# Patient Record
Sex: Female | Born: 1945 | Race: Black or African American | Hispanic: No | Marital: Single | Smoking: Former smoker
Health system: Southern US, Community
[De-identification: ages and names within clinical notes are randomized; demographics above are authoritative.]

## PROBLEM LIST (undated history)

## (undated) DIAGNOSIS — Z86718 Personal history of other venous thrombosis and embolism: Secondary | ICD-10-CM

## (undated) DIAGNOSIS — I716 Thoracoabdominal aortic aneurysm, without rupture, unspecified: Secondary | ICD-10-CM

## (undated) DIAGNOSIS — I10 Essential (primary) hypertension: Secondary | ICD-10-CM

## (undated) DIAGNOSIS — J449 Chronic obstructive pulmonary disease, unspecified: Secondary | ICD-10-CM

## (undated) HISTORY — DX: Chronic obstructive pulmonary disease, unspecified: J44.9

## (undated) HISTORY — DX: Thoracoabdominal aortic aneurysm, without rupture: I71.6

## (undated) HISTORY — DX: Thoracoabdominal aortic aneurysm, without rupture, unspecified: I71.60

## (undated) HISTORY — DX: Personal history of other venous thrombosis and embolism: Z86.718

## (undated) HISTORY — PX: NO PAST SURGERIES: SHX2092

---

## 2008-07-05 ENCOUNTER — Inpatient Hospital Stay (HOSPITAL_COMMUNITY): Admission: EM | Admit: 2008-07-05 | Discharge: 2008-07-07 | Payer: Self-pay | Admitting: Emergency Medicine

## 2008-07-05 ENCOUNTER — Encounter (INDEPENDENT_AMBULATORY_CARE_PROVIDER_SITE_OTHER): Payer: Self-pay | Admitting: Emergency Medicine

## 2008-07-06 ENCOUNTER — Ambulatory Visit: Payer: Self-pay | Admitting: Vascular Surgery

## 2010-08-19 LAB — BASIC METABOLIC PANEL
CO2: 28 mEq/L (ref 19–32)
Chloride: 103 mEq/L (ref 96–112)
GFR calc Af Amer: >60 mL/min (ref >60)
Glucose, Bld: 100 mg/dL — ABNORMAL HIGH (ref 70–99)
Sodium: 136 mEq/L (ref 135–145)

## 2010-08-19 LAB — CBC
HCT: 35.6 % — ABNORMAL LOW (ref 36.0–46.0)
Hemoglobin: 12 g/dL (ref 12.0–15.0)
MCHC: 33.8 g/dL (ref 30.0–36.0)
MCV: 88.8 fL (ref 78.0–100.0)
RBC: 4.01 MIL/uL (ref 3.87–5.11)
RDW: 14.6 % (ref 11.5–15.5)

## 2010-08-24 LAB — CBC
HCT: 37.5 % (ref 36.0–46.0)
HCT: 38.4 % (ref 36.0–46.0)
Hemoglobin: 12.6 g/dL (ref 12.0–15.0)
MCHC: 32.9 g/dL (ref 30.0–36.0)
MCV: 88.3 fL (ref 78.0–100.0)
MCV: 88.6 fL (ref 78.0–100.0)
RBC: 4.25 MIL/uL (ref 3.87–5.11)
RDW: 14.4 % (ref 11.5–15.5)
WBC: 4.9 10*3/uL (ref 4.0–10.5)

## 2010-08-24 LAB — POCT I-STAT, CHEM 8
BUN: 13 mg/dL (ref 6–23)
Calcium, Ion: 1.08 mmol/L — ABNORMAL LOW (ref 1.12–1.32)
Chloride: 106 mEq/L (ref 96–112)
Creatinine, Ser: 0.8 mg/dL (ref 0.4–1.2)

## 2010-08-24 LAB — DIFFERENTIAL
Basophils Absolute: 0.1 10*3/uL (ref 0.0–0.1)
Lymphs Abs: 2.1 10*3/uL (ref 0.7–4.0)
Monocytes Absolute: 0.6 10*3/uL (ref 0.1–1.0)
Monocytes Relative: 11 % (ref 3–12)
Neutro Abs: 2.4 10*3/uL (ref 1.7–7.7)

## 2010-08-24 LAB — BASIC METABOLIC PANEL
BUN: 6 mg/dL (ref 6–23)
Chloride: 108 mEq/L (ref 96–112)
GFR calc Af Amer: >60 mL/min (ref >60)
Potassium: 3.7 mEq/L (ref 3.5–5.1)

## 2010-08-24 LAB — MAGNESIUM: Magnesium: 2 mg/dL (ref 1.5–2.5)

## 2010-08-24 LAB — APTT: aPTT: 31 seconds (ref 24–37)

## 2010-09-21 NOTE — H&P (Signed)
[name], [name]                 ACCOUNT NO.:  [id number]   MEDICAL RECORD NO.:  [id number]          PATIENT TYPE:  INP   LOCATION:  1411                         FACILITY:  [facility]   PHYSICIAN:  Della Goo, M.D. DATE OF BIRTH:  01/06/1946   DATE OF ADMISSION:  07/05/2008  DATE OF DISCHARGE:                              HISTORY & PHYSICAL   ADMISSION DATE:  July 05, 2008.   CHIEF COMPLAINT:  Right leg swelling and pain.   HISTORY OF PRESENT ILLNESS:  This is a 65 year old female who presents  to the emergency department with complaints of increased right leg  swelling and pain over the past 2 weeks.  She reports feeling a hard  place in the back of her leg.  She denies feeling increased warmth from  the area.  She denies having any history of trauma.  She also denies  having any history of a previous similar problem.  Patient denies having  any clotting disorders in her family.   PAST MEDICAL HISTORY:  No formal diagnoses of medical problems.   PAST SURGICAL HISTORY:  None.   MEDICATIONS:  None.   ALLERGIES:  NO KNOWN DRUG ALLERGIES.   SOCIAL HISTORY:  Patient is a nonsmoker, nondrinker.   FAMILY HISTORY:  Negative for coronary artery disease, diabetic disease,  hypertension, or cancers.   REVIEW OF SYSTEMS:  Pertinents are mentioned above.  Patient denies  having any chest pain, shortness of breath, fevers, chills, nausea,  vomiting, diarrhea.  No changes in bowel or bladder habits.  No weight  loss or weight gain.  No changes in appetite, no lightheadedness,  syncope, seizure activity.  No changes in sleep cycle.   PHYSICAL EXAMINATION FINDINGS:  This is a 65 year old obese female in  discomfort but no acute distress.  VITAL SIGNS:  Temperature 97.3.  Blood pressure 189/74.  Heart rate 70.  Respirations 18.  O2 saturations 98%.  HEENT:  Normocephalic, atraumatic.  There is no scleral icterus.  Pupils  are equally round and reactive to light.  Extraocular  movements are  intact.  Funduscopic benign.  Nares are patent bilaterally.  Oropharynx  is clear.  NECK:  Supple.  Full range of motion.  No thyromegaly, adenopathy, or  jugular venous distention.  CARDIOVASCULAR:  Mild tachycardia.  No murmurs, gallops, or rubs.  LUNGS:  Clear to auscultation bilaterally.  ABDOMEN:  Positive bowel sounds.  Soft, nontender, nondistended.  EXTREMITIES:  Without cyanosis, clubbing, or edema except of the right  lower extremity.  There is 2 to 3+ edema of the lower 2/3 of her lower  extremity.  The edema is tense and painful to palpation.  No palpable  cords at present.  NEUROLOGIC:  Alert and oriented x3.  There are no focal deficits.   LABORATORY STUDIES:  White blood cell count 5.4, hemoglobin 12.6,  hematocrit 38.4, platelets 249, MCV 88.6, neutrophils 46%, lymphocytes  38%.  Sodium 140, potassium 3.1, chloride 106, bicarb 25, BUN 13,  creatinine 0.8, and glucose 98.  Pro time 13.0, INR 1.0, PTT 31.  Venous  duplex Doppler study of  the right lower extremity reveals extensive  occlusive deep venous thrombosis throughout the right lower extremity.  No superficial venous thrombosis or Baker cyst seen.  No propagation of  DVT to the left lower extremity.   ASSESSMENT:  A 65 year old female being admitted with:  1. 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  2. Hypertension.  3. Hypokalemia.  4. Morbid obesity.   PLAN:  Patient will be admitted to a telemetry area and full-dose  Lovenox therapy has been initiated.  Patient will be placed on the  Coumadin protocol and Coumadin teaching will also be started.  Her  potassium will be repleted and patient will be placed on GI prophylaxis.  A p.r.n. clonidine order has been given for elevated blood pressures at  this time and this will be monitored and patient will be started on  appropriate blood pressure medications if needed.      Della Goo, M.D.  Electronically Signed      HJ/MEDQ  D:  07/06/2008  T:  07/06/2008  Job:  [id number]

## 2010-09-21 NOTE — Discharge Summary (Signed)
[name], [name]                 ACCOUNT NO.:  [id number]   MEDICAL RECORD NO.:  [id number]          PATIENT TYPE:  INP   LOCATION:  1411                         FACILITY:  [facility]   PHYSICIAN:  Hillery Aldo, M.D.   DATE OF BIRTH:  Dec 03, 1945   DATE OF ADMISSION:  07/05/2008  DATE OF DISCHARGE:                               DISCHARGE SUMMARY   PRIMARY CARE PHYSICIAN:  None.   The patient is being referred to Dr. Mikeal Hawthorne for hospital follow-up and  primary care.   DISCHARGE DIAGNOSES:  1. Right lower extremity deep venous thrombosis.  2. Hypertension.  3. Hypokalemia.   DISCHARGE MEDICATIONS:  1. Lisinopril 40 mg p.o. daily.  2. Coumadin 10 mg p.o. daily or as directed by PCP.  3. Lovenox 110 mg subcutaneously q.12 h. until told to stop by PCP.   CONSULTATIONS:  None.   BRIEF ADMISSION HPI:  The patient is a 65 year old female with no  significant past medical history who presented to the hospital with a  chief complaint of right lower extremity swelling and pain.  She had not  been on any medications prior to admission.  She was admitted for  further evaluation and workup.  For full details, please see the  dictated report done by Dr. Lovell Sheehan.   PROCEDURES AND DIAGNOSTIC STUDIES:  Right lower extremity venous  Dopplers done on July 05, 2008 showed extensive occlusive DVT  throughout the right lower extremity.  No superficial venous thrombosis  or Baker cyst.  No propagation of the DVT to the left lower extremity.   DISCHARGE LABORATORY VALUES:  Sodium was 136, potassium 3.2 (repleted  with 40 mEq of KCl prior to discharge), chloride 103, bicarb 28, BUN 10,  creatinine 0.75, glucose 100.  White blood cell count was 5, hemoglobin  12, hematocrit 35.6, platelets 246.  PT/INR was 14.4/1.1.  TSH was  1.305.   HOSPITAL COURSE BY PROBLEM:  1. Right lower extremity deep vein thrombosis:  The patient was      admitted and put on therapeutic doses of Lovenox and Coumadin.   The      patient was anxious to be discharged and agreed to give herself      Lovenox injections.  She will be taught how to self-inject Lovenox      prior to discharge.  A hospital follow-up appointment with a lab      check has been made for the patient with Dr. Mikeal Hawthorne at the [facility] for a 2 p.m. on July 08, 2008.  The patient has been      informed fully that she will need close monitoring while on      Coumadin and daily blood work done until her INR is therapeutic and      then routinely thereafter.  The patient agrees to this and      therefore will be discharged home.  2. Hypertension:  The patient is a newly diagnosed hypertensive.  She      was placed on lisinopril at 20 mg and this has been  titrated up to      40 at discharge.  She will need close follow-up with regard to her      blood pressure.  3. Hypokalemia:  The patient was appropriately repleted prior to      discharge.   DISPOSITION:  The patient is anxious for discharge and agrees to self-  inject Lovenox and follow up with Dr. Mikeal Hawthorne.   Time spent coordinating care for discharge and the discharge  instructions equals 40 minutes.      Hillery Aldo, M.D.  Electronically Signed     CR/MEDQ  D:  07/07/2008  T:  07/07/2008  Job:  [id number]   cc:   Lonia Blood, M.D.

## 2010-11-15 ENCOUNTER — Inpatient Hospital Stay (HOSPITAL_COMMUNITY)
Admission: EM | Admit: 2010-11-15 | Discharge: 2010-11-22 | DRG: 394 | Disposition: A | Discharge status: Home / Self Care | Payer: Medicare Other | Attending: Internal Medicine | Admitting: Internal Medicine

## 2010-11-15 ENCOUNTER — Emergency Department (HOSPITAL_COMMUNITY): Payer: Medicare Other

## 2010-11-15 DIAGNOSIS — Z7901 Long term (current) use of anticoagulants: Secondary | ICD-10-CM

## 2010-11-15 DIAGNOSIS — N390 Urinary tract infection, site not specified: Secondary | ICD-10-CM | POA: Diagnosis present

## 2010-11-15 DIAGNOSIS — K55059 Acute (reversible) ischemia of intestine, part and extent unspecified: Principal | ICD-10-CM | POA: Diagnosis present

## 2010-11-15 DIAGNOSIS — E876 Hypokalemia: Secondary | ICD-10-CM | POA: Diagnosis present

## 2010-11-15 DIAGNOSIS — Z86718 Personal history of other venous thrombosis and embolism: Secondary | ICD-10-CM

## 2010-11-15 DIAGNOSIS — I10 Essential (primary) hypertension: Secondary | ICD-10-CM | POA: Diagnosis present

## 2010-11-15 LAB — URINALYSIS, ROUTINE W REFLEX MICROSCOPIC
Hgb urine dipstick: NEGATIVE
Nitrite: NEGATIVE
Specific Gravity, Urine: 1.024 (ref 1.005–1.030)
Urobilinogen, UA: 1 mg/dL (ref 0.0–1.0)

## 2010-11-15 LAB — URINE MICROSCOPIC-ADD ON

## 2010-11-15 LAB — LIPID PANEL
Cholesterol: 149 mg/dL (ref 0–200)
HDL: 49 mg/dL (ref >39)
Total CHOL/HDL Ratio: 3 RATIO
Triglycerides: 92 mg/dL (ref <150)

## 2010-11-15 LAB — HEPATIC FUNCTION PANEL
ALT: 10 U/L (ref 0–35)
AST: 13 U/L (ref 0–37)
Bilirubin, Direct: 0.1 mg/dL (ref 0.0–0.3)
Total Bilirubin: 0.1 mg/dL — ABNORMAL LOW (ref 0.3–1.2)

## 2010-11-15 LAB — OCCULT BLOOD, POC DEVICE: Fecal Occult Bld: POSITIVE

## 2010-11-15 LAB — DIFFERENTIAL
Lymphocytes Relative: 22 % (ref 12–46)
Lymphs Abs: 2.4 10*3/uL (ref 0.7–4.0)
Neutrophils Relative %: 71 % (ref 43–77)

## 2010-11-15 LAB — BASIC METABOLIC PANEL
BUN: 16 mg/dL (ref 6–23)
Chloride: 101 mEq/L (ref 96–112)
Creatinine, Ser: 0.81 mg/dL (ref 0.50–1.10)
GFR calc Af Amer: >60 mL/min (ref >60)
GFR calc non Af Amer: >60 mL/min (ref >60)

## 2010-11-15 LAB — CBC
HCT: 37.4 % (ref 36.0–46.0)
MCV: 86.4 fL (ref 78.0–100.0)
RBC: 4.33 MIL/uL (ref 3.87–5.11)
WBC: 10.7 10*3/uL — ABNORMAL HIGH (ref 4.0–10.5)

## 2010-11-15 LAB — MAGNESIUM: Magnesium: 2.2 mg/dL (ref 1.5–2.5)

## 2010-11-15 LAB — GLUCOSE, CAPILLARY: Glucose-Capillary: 86 mg/dL (ref 70–99)

## 2010-11-15 LAB — CROSSMATCH: Antibody Screen: NEGATIVE

## 2010-11-15 LAB — CK TOTAL AND CKMB (NOT AT ARMC): Relative Index: 3.1 — ABNORMAL HIGH (ref 0.0–2.5)

## 2010-11-16 DIAGNOSIS — R109 Unspecified abdominal pain: Secondary | ICD-10-CM

## 2010-11-16 DIAGNOSIS — R195 Other fecal abnormalities: Secondary | ICD-10-CM

## 2010-11-16 DIAGNOSIS — R1084 Generalized abdominal pain: Secondary | ICD-10-CM

## 2010-11-16 DIAGNOSIS — K55059 Acute (reversible) ischemia of intestine, part and extent unspecified: Secondary | ICD-10-CM

## 2010-11-16 DIAGNOSIS — R933 Abnormal findings on diagnostic imaging of other parts of digestive tract: Secondary | ICD-10-CM

## 2010-11-16 LAB — CBC
HCT: 33.6 % — ABNORMAL LOW (ref 36.0–46.0)
HCT: 33.3 % — ABNORMAL LOW (ref 36.0–46.0)
HCT: 34 % — ABNORMAL LOW (ref 36.0–46.0)
Hemoglobin: 11.3 g/dL — ABNORMAL LOW (ref 12.0–15.0)
Hemoglobin: 11 g/dL — ABNORMAL LOW (ref 12.0–15.0)
Hemoglobin: 11.5 g/dL — ABNORMAL LOW (ref 12.0–15.0)
MCH: 29.1 pg (ref 26.0–34.0)
MCH: 28.7 pg (ref 26.0–34.0)
MCH: 28.7 pg (ref 26.0–34.0)
MCHC: 33.6 g/dL (ref 30.0–36.0)
MCHC: 33 g/dL (ref 30.0–36.0)
MCHC: 32.9 g/dL (ref 30.0–36.0)
MCV: 86.6 fL (ref 78.0–100.0)
MCV: 86.9 fL (ref 78.0–100.0)
MCV: 87.2 fL (ref 78.0–100.0)
Platelets: 257 10*3/uL (ref 150–400)
Platelets: 257 10*3/uL (ref 150–400)
RBC: 3.9 MIL/uL (ref 3.87–5.11)
RDW: 13.3 % (ref 11.5–15.5)
RDW: 13.4 % (ref 11.5–15.5)
WBC: 8.3 10*3/uL (ref 4.0–10.5)

## 2010-11-16 LAB — SEDIMENTATION RATE: Sed Rate: 60 mm/hr — ABNORMAL HIGH (ref 0–22)

## 2010-11-16 LAB — GLUCOSE, CAPILLARY
Glucose-Capillary: 81 mg/dL (ref 70–99)
Glucose-Capillary: 87 mg/dL (ref 70–99)
Glucose-Capillary: 77 mg/dL (ref 70–99)
Glucose-Capillary: 69 mg/dL — ABNORMAL LOW (ref 70–99)

## 2010-11-16 LAB — COMPREHENSIVE METABOLIC PANEL
ALT: 8 U/L (ref 0–35)
Albumin: 2.7 g/dL — ABNORMAL LOW (ref 3.5–5.2)
Alkaline Phosphatase: 63 U/L (ref 39–117)
Chloride: 102 mEq/L (ref 96–112)
Glucose, Bld: 74 mg/dL (ref 70–99)
Potassium: 3 mEq/L — ABNORMAL LOW (ref 3.5–5.1)
Sodium: 137 mEq/L (ref 135–145)
Total Bilirubin: 0.2 mg/dL — ABNORMAL LOW (ref 0.3–1.2)
Total Protein: 6.9 g/dL (ref 6.0–8.3)

## 2010-11-16 LAB — TSH: TSH: 1.784 u[IU]/mL (ref 0.350–4.500)

## 2010-11-16 LAB — MAGNESIUM: Magnesium: 2.2 mg/dL (ref 1.5–2.5)

## 2010-11-16 LAB — LIPID PANEL
Total CHOL/HDL Ratio: 3.1 RATIO
VLDL: 18 mg/dL (ref 0–40)

## 2010-11-16 LAB — CARDIAC PANEL(CRET KIN+CKTOT+MB+TROPI)
CK, MB: 4.8 ng/mL — ABNORMAL HIGH (ref 0.3–4.0)
CK, MB: 4.5 ng/mL — ABNORMAL HIGH (ref 0.3–4.0)
Troponin I: <0.3 ng/mL (ref <0.30)

## 2010-11-16 LAB — PROTIME-INR
INR: 1.27 (ref 0.00–1.49)
Prothrombin Time: 16.2 seconds — ABNORMAL HIGH (ref 11.6–15.2)

## 2010-11-16 LAB — ABO/RH: ABO/RH(D): B POS

## 2010-11-16 LAB — HEPARIN LEVEL (UNFRACTIONATED): Heparin Unfractionated: 0.1 IU/mL — ABNORMAL LOW (ref 0.30–0.70)

## 2010-11-16 MED ORDER — IOHEXOL 300 MG/ML  SOLN
100.0000 mL | Freq: Once | INTRAMUSCULAR | Status: AC | PRN
  Administered 2010-11-16: 100 mL via INTRAVENOUS

## 2010-11-16 NOTE — H&P (Signed)
[name], [name]                 ACCOUNT NO.:  [id number]  MEDICAL RECORD NO.:  [id number]  LOCATION:  [facility]                         FACILITY:  [facility]  PHYSICIAN:  Eduard Clos, MDDATE OF BIRTH:  08-22-1945  DATE OF ADMISSION:  11/15/2010 DATE OF DISCHARGE:                             HISTORY & PHYSICAL   PRIMARY CARE PHYSICIAN:  Unassigned.  CHIEF COMPLAINT:  Abdominal pain.  HISTORY OF PRESENTING ILLNESS:  A 65 year old female with history of previous DVT, presents with complaints of having abdominal pain.  The patient saying that she has been having abdominal pain for the last few weeks, which has increased over the last couple of days.  The pain is most in the epigastric area and right upper quadrant area, which the patient is unable to correctly characterized.  The patient states that sometimes increased the pain, but the pain has become more softer and persistent for which she came to the ER.  In the ER, the patient also had a stool for occult blood which was positive.  At this time, given the history, the patient has been taking NSAIDs for her arthralgias. The patient probably has septal GI bleed with a possibility of peptic ulcer disease.  The patient denies any chest pain, denies any shortness of breath, dizziness, loss of consciousness or any focal deficit.  Denies any dysuria or discharge, but does have diarrhea which the patient states has been worsened for last 2 days, and has been chronic and nothing new for her.  Denies any using any recent antibiotics.  PAST MEDICAL HISTORY:  History of DVT and was diagnosed with hypertension.  PAST SURGICAL HISTORY:  Non-preparation.  ALLERGIES:  No known drug allergies.  FAMILY HISTORY:  Negative for any colon cancer, positive for hypertension.  SOCIAL HISTORY:  Socially, the patient denies smoking cigarette, drinking alcohol, or use illegal drugs.  REVIEW OF SYSTEMS:  As per the history of presenting illness  nothing else significant.  PHYSICAL EXAMINATION:  GENERAL:  The patient examined at bedside not in acute distress. VITAL SIGNS:  Blood pressure is 120/70, pulse is 68 per minute, temperature 98.6, respirations 18 per minute, and O2 sat 96%. HEENT:  Anicteric.  No pallor.  No discharge from ears, eyes, nose, or mouth. CHEST:  Bilateral air entry present.  No rhonchi.  No crepitation. HEART:  S1 and S2 heard. ABDOMEN:  Soft.  There is tenderness in the right upper quadrant and epigastric area, and no guarding or rigidity.  Bowel sounds are present. CNS:  The patient is alert, awake, and oriented to time, place, and person.  Moves upper and lower extremities 5/5. EXTREMITIES:  Peripheral pulses are felt.  No edema.  LABORATORY STUDIES:  EKG shows normal sinus rhythm with heart rate around 79 beats per minute with mild some T-wave change in the lateral leads.  I do not have an old EKG to compare.  CBC; WBC is 10.7, hemoglobin is 12.8, hematocrit is 37.4, platelets 278.  Complete metabolic panel; sodium 138, potassium 3.2, chloride 101, carbon dioxide 25, glucose 89, BUN 16, creatinine 0.8.  Total bilirubin is 0.1, alkaline phosphatase is 74, AST 13, ALT 10, total protein 8.2,  albumin 3.1, calcium 8.9, lipase 19.  CK 158, MB is 4.9, relative index 3.1, troponin less than 0.3.  UA is cloudy, ketones 15, negative glucose, protein 30, leukocytes moderate, squamous epithelial, wbc 11-20, bacteria few, mucous present.  Fecal occult blood is positive.  ASSESSMENT: 1. Abdominal pain with fecal occult blood positive and history of     using nonsteroidal anti-inflammatory drugs for chronic arthralgia. 2. Possible urinary tract infection. 3. History of deep venous thrombosis. 4. History of chronic diarrhea.  PLAN: 1. At this time, we will admit the patient to telemetry. 2. For abdominal pain and stool occult blood positive we will keep     on iv protonix and check CBC transfuse if needed. I  have consulted     GI Dr Thana Ates will see patient later. Patients abdomen is tender,    will get a CT abdomen and pelvis to look into other causes for the     abdominal pain.Will get lactic acid levels. 3. Possible UTI, the patient will be kept on ceftriaxone.  We will get     urine cultures and sensitivity. 4. The patient does have some EKG changes, though the patient does not     have any chest pain for now we will cycle cardiac enzymes. 5. Further recommendation based on the clinical course and test     orders.     Eduard Clos, MD     ANK/MEDQ  D:  11/15/2010  T:  11/15/2010  Job:  [id number]  Electronically Signed by Midge Minium MD on 11/16/2010 05:46:38 AM

## 2010-11-17 LAB — CBC
HCT: 33.6 % — ABNORMAL LOW (ref 36.0–46.0)
Hemoglobin: 11.2 g/dL — ABNORMAL LOW (ref 12.0–15.0)
MCH: 29 pg (ref 26.0–34.0)
MCHC: 33.3 g/dL (ref 30.0–36.0)
MCV: 87 fL (ref 78.0–100.0)
RDW: 13.2 % (ref 11.5–15.5)

## 2010-11-17 LAB — HOMOCYSTEINE: Homocysteine: 7 umol/L (ref 4.0–15.4)

## 2010-11-17 LAB — BASIC METABOLIC PANEL
CO2: 24 mEq/L (ref 19–32)
Calcium: 8.2 mg/dL — ABNORMAL LOW (ref 8.4–10.5)
Chloride: 106 mEq/L (ref 96–112)
Creatinine, Ser: 0.59 mg/dL (ref 0.50–1.10)
Glucose, Bld: 91 mg/dL (ref 70–99)

## 2010-11-17 LAB — GLUCOSE, CAPILLARY
Glucose-Capillary: 91 mg/dL (ref 70–99)
Glucose-Capillary: 109 mg/dL — ABNORMAL HIGH (ref 70–99)
Glucose-Capillary: 106 mg/dL — ABNORMAL HIGH (ref 70–99)

## 2010-11-17 LAB — ANA: Anti Nuclear Antibody(ANA): NEGATIVE

## 2010-11-18 LAB — LUPUS ANTICOAGULANT PANEL
DRVVT: 46.1 secs — ABNORMAL HIGH (ref 36.2–44.3)
PTT Lupus Anticoagulant: 131.8 secs — ABNORMAL HIGH (ref 30.0–45.6)
PTTLA 4:1 Mix: 97.9 secs — ABNORMAL HIGH (ref 30.0–45.6)

## 2010-11-18 LAB — GLUCOSE, CAPILLARY
Glucose-Capillary: 109 mg/dL — ABNORMAL HIGH (ref 70–99)
Glucose-Capillary: 142 mg/dL — ABNORMAL HIGH (ref 70–99)
Glucose-Capillary: 110 mg/dL — ABNORMAL HIGH (ref 70–99)

## 2010-11-18 LAB — CBC
Hemoglobin: 10.9 g/dL — ABNORMAL LOW (ref 12.0–15.0)
Platelets: 280 10*3/uL (ref 150–400)
RBC: 3.81 MIL/uL — ABNORMAL LOW (ref 3.87–5.11)
WBC: 6.1 10*3/uL (ref 4.0–10.5)

## 2010-11-18 LAB — HEPARIN LEVEL (UNFRACTIONATED): Heparin Unfractionated: 0.3 IU/mL (ref 0.30–0.70)

## 2010-11-18 LAB — ANTITHROMBIN III: AntiThromb III Func: 100 % (ref 76–126)

## 2010-11-18 LAB — PROTEIN S ACTIVITY: Protein S Activity: 71 % (ref 69–129)

## 2010-11-19 LAB — CBC
HCT: 32.7 % — ABNORMAL LOW (ref 36.0–46.0)
Hemoglobin: 10.7 g/dL — ABNORMAL LOW (ref 12.0–15.0)
RBC: 3.79 MIL/uL — ABNORMAL LOW (ref 3.87–5.11)
WBC: 6.1 10*3/uL (ref 4.0–10.5)

## 2010-11-19 LAB — PROTIME-INR
INR: 1.14 (ref 0.00–1.49)
Prothrombin Time: 14.8 seconds (ref 11.6–15.2)

## 2010-11-19 LAB — GLUCOSE, CAPILLARY: Glucose-Capillary: 110 mg/dL — ABNORMAL HIGH (ref 70–99)

## 2010-11-19 LAB — PROTEIN S, TOTAL: Protein S Ag, Total: 108 % (ref 60–150)

## 2010-11-20 LAB — CBC
MCV: 86.6 fL (ref 78.0–100.0)
Platelets: 299 10*3/uL (ref 150–400)
RDW: 13.6 % (ref 11.5–15.5)
WBC: 6.3 10*3/uL (ref 4.0–10.5)

## 2010-11-20 LAB — HEPARIN LEVEL (UNFRACTIONATED): Heparin Unfractionated: 0.61 IU/mL (ref 0.30–0.70)

## 2010-11-20 NOTE — Consult Note (Signed)
  NAMECAIA, LOFARO                 ACCOUNT NO.:  [id number]  MEDICAL RECORD NO.:  [id number]  LOCATION:  4735                         FACILITY:  [facility]  PHYSICIAN:  Gabrielle Dare. Janee Morn, M.D.DATE OF BIRTH:  09/29/1945  DATE OF CONSULTATION:  11/16/2010 DATE OF DISCHARGE:                                CONSULTATION   REASON FOR CONSULTATION:  Abdominal pain.  HISTORY OF PRESENT ILLNESS:  [name] is a 65 year old African American female who complains of a 2-day history of epigastric abdominal pain with occasional nausea and vomiting.  She also has stomach discomfort recently, but this was an increase in pain.  She had associated anorexia.  She has been having bowel movements, but sometimes loose and one was a little bit dark.  The patient was seen in the emergency department and admitted by the [facility].  The patient has been taking Advil at home for the pain.  PAST MEDICAL HISTORY:  Hypertension and DVT.  The DVT required Coumadin for about 2 years.  PAST SURGICAL HISTORY:  None.  SOCIAL HISTORY:  Does not smoke, does not drink alcohol.  MEDICATIONS:  Aspirin.  ALLERGIES:  No known drug allergies.  PHYSICAL EXAMINATION:  VITAL SIGNS:  Temperature 98.5, blood pressure 131/71, heart rate 62, respirations 80, and saturations 95% on room air. GENERAL:  The patient is awake and alert.  Speech is fluent.  She moves all extremities. NECK:  Supple. LUNGS:  Clear to auscultation. HEART:  Regular with no murmurs.  Impulses are palpable in the left chest. ABDOMEN:  Soft, mildly distended.  It is tender to palpation in the upper abdomen, but there is no guarding.  Bowel sounds are present and no masses are noted. EXTREMITIES:  With no significant edema. SKIN:  Warm and dry.  White blood cell count 8.9.  Lactate 0.6.  CT scan of the abdomen and pelvis shows mesenteric vein thrombosis with surrounding mesenteric edema and small bowel edema.  There is no free air.   There is no bowel pneumatosis.  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  PLAN:  I agree with IV antibiotics.  I will start the patient on IV heparin drip.  We will followup abdominal exams, so we will keep her n.p.o. at this time and while she is in the hospital we will do a hypercoagulable workup as possible cause.  The plan was discussed in detail with the patient including the possibility of surgery if she worsens.     Gabrielle Dare Janee Morn, M.D.     BET/MEDQ  D:  11/16/2010  T:  11/16/2010  Job:  [id number]  cc:   Eduard Clos, MD  Electronically Signed by Violeta Gelinas M.D. on 11/20/2010 02:48:14 PM

## 2010-11-21 LAB — CBC
HCT: 33.9 % — ABNORMAL LOW (ref 36.0–46.0)
MCH: 29 pg (ref 26.0–34.0)
MCHC: 33.3 g/dL (ref 30.0–36.0)
RDW: 13.9 % (ref 11.5–15.5)

## 2010-11-21 LAB — PROTIME-INR: Prothrombin Time: 14.9 seconds (ref 11.6–15.2)

## 2010-11-22 LAB — CBC
HCT: 33.7 % — ABNORMAL LOW (ref 36.0–46.0)
Hemoglobin: 11.2 g/dL — ABNORMAL LOW (ref 12.0–15.0)
MCH: 28.9 pg (ref 26.0–34.0)
MCHC: 33.2 g/dL (ref 30.0–36.0)
MCV: 86.9 fL (ref 78.0–100.0)

## 2010-11-22 LAB — PROTIME-INR: Prothrombin Time: 15.2 seconds (ref 11.6–15.2)

## 2010-11-22 LAB — CARDIOLIPIN ANTIBODIES, IGG, IGM, IGA
Anticardiolipin IgA: 8 APL U/mL — ABNORMAL LOW (ref <22)
Anticardiolipin IgG: 12 GPL U/mL (ref <23)

## 2010-11-22 LAB — BETA-2-GLYCOPROTEIN I ABS, IGG/M/A: Beta-2-Glycoprotein I IgA: 9 A Units (ref <20)

## 2010-11-25 NOTE — Discharge Summary (Signed)
  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  PAST PROCEDURE: 1. CT scan of the abdomen and pelvis on November 16, 2010. 2. Hypercoagulable workup, negative. 3. Antinuclear antibody, rheumatoid factor:  Negative. 4. PT/INR at discharge 15.2/1.18. 5. Cardiac enzymes negative.  REASON FOR HOSPITALIZATION:  Abdominal pain.  HOSPITAL COURSE:  [name] is a 65 year old female who was admitted to our hospital for abdominal pain and workup revealed presence of mesenteric vein thrombosis.  The patient was placed n.p.o. and admitted to the hospital on broad spectrum antibiotics initially Rocephin and subsequently switched to Cipro and Flagyl.  As soon as the patient was found with mesenteric vein thrombosis, she was tested for a hypercoagulable state and started on IV heparin.  Consultation from the surgery service and GI service was obtained and the patient was felt to be a candidate for conservative management with n.p.o., IV fluids and IV antibiotics and anticoagulation.  During her hospital stay, the patient was noted to be hypertensive and she was placed on amlodipine and benazepril.  The patient refused to take benazepril stating that her blood pressure would drop too much.  The patient made a gradual recovery and her diet was eventually advanced to clear liquids and subsequently to a heart healthy diet.  She tolerated the food well and did not have any complications.  The patient was started eventually on Coumadin and unfortunately at  the time of the discharge, her INR was not therapeutic. She was set up through [employer] [facility] to receive daily Lovenox injections as an outpatient for a total of up to 10 days when her PT/INR reaches therapeutic levels of 2-3.  The patient has been set up already and she is ready to be discharged with a Lovenox initiation pack.  The patient is instructed to take Coumadin 6 mg a day and she already has been set up with a primary care physician who will see her on Wednesday, November 24, 2010.  CONDITION AT DISCHARGE:  Stable.  FOLLOWUP:  Followup with primary care physician on November 24, 2010. Obtain PT/INR in two day at the time of her appointment.  The patient was provided with prescriptions for amlodipine 5 mg a day and warfarin 6 mg a day and Lovenox once provided by pharmacy.          ______________________________ Jonny Ruiz, MD     GL/MEDQ  D:  11/22/2010  T:  11/22/2010  Job:  [id number]  cc:   [facility]  Electronically Signed by Jonny Ruiz MD on 11/25/2010 09:44:51 AM

## 2018-04-13 ENCOUNTER — Encounter (HOSPITAL_COMMUNITY): Payer: Self-pay | Admitting: *Deleted

## 2018-04-13 ENCOUNTER — Inpatient Hospital Stay (HOSPITAL_COMMUNITY): Payer: Medicare Other

## 2018-04-13 ENCOUNTER — Inpatient Hospital Stay (HOSPITAL_COMMUNITY)
Admission: AD | Admit: 2018-04-13 | Discharge: 2018-04-14 | Disposition: A | Discharge status: Home / Self Care | Payer: Medicare Other | Attending: Emergency Medicine | Admitting: Emergency Medicine

## 2018-04-13 ENCOUNTER — Other Ambulatory Visit: Payer: Self-pay

## 2018-04-13 DIAGNOSIS — Z87891 Personal history of nicotine dependence: Secondary | ICD-10-CM | POA: Insufficient documentation

## 2018-04-13 DIAGNOSIS — R05 Cough: Secondary | ICD-10-CM | POA: Insufficient documentation

## 2018-04-13 DIAGNOSIS — I10 Essential (primary) hypertension: Secondary | ICD-10-CM | POA: Insufficient documentation

## 2018-04-13 DIAGNOSIS — Z86718 Personal history of other venous thrombosis and embolism: Secondary | ICD-10-CM

## 2018-04-13 DIAGNOSIS — R059 Cough, unspecified: Secondary | ICD-10-CM

## 2018-04-13 DIAGNOSIS — R0602 Shortness of breath: Secondary | ICD-10-CM | POA: Diagnosis present

## 2018-04-13 HISTORY — DX: Essential (primary) hypertension: I10

## 2018-04-13 LAB — URINALYSIS, ROUTINE W REFLEX MICROSCOPIC
Bilirubin Urine: NEGATIVE
Glucose, UA: NEGATIVE mg/dL
Ketones, ur: NEGATIVE mg/dL
Leukocytes, UA: NEGATIVE
Nitrite: NEGATIVE
PH: 5 (ref 5.0–8.0)
Protein, ur: 100 mg/dL — AB
Specific Gravity, Urine: 1.028 (ref 1.005–1.030)

## 2018-04-13 MED ORDER — ALBUTEROL SULFATE HFA 108 (90 BASE) MCG/ACT IN AERS
2.0000 | INHALATION_SPRAY | Freq: Once | RESPIRATORY_TRACT | Status: AC
  Administered 2018-04-13: 2 via RESPIRATORY_TRACT
  Filled 2018-04-13: qty 6.7

## 2018-04-13 MED ORDER — DEXAMETHASONE 4 MG PO TABS
10.0000 mg | ORAL_TABLET | Freq: Once | ORAL | Status: AC
  Administered 2018-04-13: 10 mg via ORAL
  Filled 2018-04-13: qty 2

## 2018-04-13 NOTE — MAU Provider Note (Signed)
History     CSN: [id number]  Arrival date and time: 04/13/18 [id number]   First Provider Initiated Contact with Patient 04/13/18 2106      Chief Complaint  Patient presents with  . Shortness of Breath  . Cough   HPI [name] is a 72 y.o. [id number] non pregnant female who presents with shortness of breath. She states this has been ongoing for 3 weeks but is getting worse. She states it is worse when she lays down and relieved by sitting up. She denies any chest pain. 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 She denies taking any preventative medication. She also has hypertension and does not take medication for that.   OB History    Gravida  9   Para      Term      Preterm      AB  1   Living  8     SAB      TAB      Ectopic      Multiple      Live Births  8           Past Medical History:  Diagnosis Date  . Hypertension     Past Surgical History:  Procedure Laterality Date  . NO PAST SURGERIES      No family history on file.  Social History   Tobacco Use  . Smoking status: Former Smoker    Last attempt to quit: 05/10/1987    Years since quitting: 30.9  . Smokeless tobacco: Never Used  Substance Use Topics  . Alcohol use: Never    Frequency: Never  . Drug use: Never    Allergies: Allergies not on file  No medications prior to admission.    Review of Systems  Constitutional: Negative.  Negative for fatigue and fever.  HENT: Negative.   Respiratory: Positive for shortness of breath.   Cardiovascular: Negative.  Negative for chest pain.  Gastrointestinal: Negative.  Negative for abdominal pain, constipation, diarrhea, nausea and vomiting.  Genitourinary: Negative.  Negative for dysuria.  Neurological: Negative.  Negative for dizziness and headaches.   Physical Exam   Blood pressure (!) 192/76, pulse 60, temperature (!) 97.5 F (36.4 C), resp. rate (!) 22, height 5\' 10"  (1.778 m), weight 90.3  kg, SpO2 96 %.  Patient Vitals for the past 24 hrs:  BP Temp Pulse Resp SpO2 Height Weight  04/13/18 2116 (!) 197/77 - 65 - 95 % - -  04/13/18 2017 (!) 192/76 (!) 97.5 F (36.4 C) 60 (!) 22 96 % 5\' 10"  (1.778 m) 90.3 kg   Physical Exam  Nursing note and vitals reviewed. Constitutional: She is oriented to person, place, and time. She appears well-developed and well-nourished. No distress.  HENT:  Head: Normocephalic.  Eyes: Pupils are equal, round, and reactive to light.  Cardiovascular: Normal rate, regular rhythm and normal heart sounds. Exam reveals no friction rub.  No murmur heard. Respiratory: Effort normal and breath sounds normal. No respiratory distress. She has no wheezes. She has no rales. She exhibits no tenderness.  GI: Soft. Bowel sounds are normal. She exhibits no distension. There is no tenderness.  Neurological: She is alert and oriented to person, place, and time.  Skin: Skin is warm and dry.  Psychiatric: She has a normal mood and affect. Her behavior is normal. Judgment and thought content normal.    MAU Course  Procedures Results for orders  placed or performed during the hospital encounter of 04/13/18 (from the past 24 hour(s))  Urinalysis, Routine w reflex microscopic     Status: Abnormal   Collection Time: 04/13/18  8:36 PM  Result Value Ref Range   Color, Urine YELLOW YELLOW   APPearance CLEAR CLEAR   Specific Gravity, Urine 1.028 1.005 - 1.030   pH 5.0 5.0 - 8.0   Glucose, UA NEGATIVE NEGATIVE mg/dL   Hgb urine dipstick SMALL (A) NEGATIVE   Bilirubin Urine NEGATIVE NEGATIVE   Ketones, ur NEGATIVE NEGATIVE mg/dL   Protein, ur 161 (A) NEGATIVE mg/dL   Nitrite NEGATIVE NEGATIVE   Leukocytes, UA NEGATIVE NEGATIVE   RBC / HPF 6-10 0 - 5 RBC/hpf   WBC, UA 6-10 0 - 5 WBC/hpf   Bacteria, UA RARE (A) NONE SEEN   Squamous Epithelial / LPF 0-5 0 - 5   Mucus PRESENT    MDM UA  Consulted with Dr. Jolayne Panther- will transfer [facility]- Dr.  Ranae Palms given report and accepted transfer  Assessment and Plan   1. Shortness of breath   2. History of DVT in adulthood    -Transfer to [facility] via [facility]  Rolm Bookbinder CNM 04/13/2018, 9:06 PM

## 2018-04-13 NOTE — ED Notes (Signed)
Bed: [id number] Expected date:  Expected time:  Means of arrival:  Comments[facility]hx DVT in leg and gut BP 192/77-coming by [facility]

## 2018-04-13 NOTE — MAU Note (Addendum)
SOB and cough for 3wks. First was a cough and thought had cold. Been taking OTC meds. SOB comes when I get to coughing and hard to get breath. Chest congested and cough up white stuff on occ. Have not taken temp but sometimes I get hot. Does have diarrhea

## 2018-04-13 NOTE — ED Triage Notes (Signed)
Patient complains of shortness of breath (SOB) for 2 weeks. It was reported that the SOB happens only when the patient if coughing.

## 2018-04-13 NOTE — ED Provider Notes (Signed)
[facility]-EMERGENCY DEPT Provider Note  CSN: [id number] Arrival date & time: 04/13/18 1955  Chief Complaint(s) Shortness of Breath and Cough  HPI [name] is a 72 y.o. female   The history is provided by the patient.  Cough  This is a new problem. Episode onset: 2 weeks. The problem occurs every few minutes. The problem has been gradually worsening. The cough is non-productive. There has been no fever. Associated symptoms include chills, rhinorrhea, sore throat and shortness of breath (only with coughing). Pertinent negatives include no chest pain. She has tried decongestants for the symptoms. The treatment provided mild relief. She is not a smoker. Her past medical history is significant for bronchitis.    Past Medical History Past Medical History:  Diagnosis Date  . Hypertension    There are no active problems to display for this patient.  Home Medication(s) Prior to Admission medications   Medication Sig Start Date End Date Taking? Authorizing Provider  guaiFENesin-dextromethorphan (ROBITUSSIN DM) 100-10 MG/5ML syrup Take 10 mLs by mouth every 4 (four) hours as needed for cough.   Yes [provider]  ibuprofen (ADVIL,MOTRIN) 200 MG tablet Take 1,600 mg by mouth once as needed for moderate pain.   Yes [provider]  benzonatate (TESSALON) 200 MG capsule Take 1 capsule (200 mg total) by mouth 3 (three) times daily as needed for up to 7 days for cough. 04/14/18 04/21/18  Nira Connardama, Romaine Neville Eduardo, MD  guaiFENesin-codeine 100-10 MG/5ML syrup Take 5 mLs by mouth every 6 (six) hours as needed for up to 3 days for cough. 04/14/18 04/17/18  Nira Connardama, Aneesha Holloran Eduardo, MD                                                                                                                                    Past Surgical History Past Surgical History:  Procedure Laterality Date  . NO PAST SURGERIES     Family History History reviewed. No pertinent family  history.  Social History Social History   Tobacco Use  . Smoking status: Former Smoker    Last attempt to quit: 05/10/1987    Years since quitting: 30.9  . Smokeless tobacco: Never Used  Substance Use Topics  . Alcohol use: Never    Frequency: Never  . Drug use: Never   Allergies Patient has no known allergies.  Review of Systems Review of Systems  Constitutional: Positive for chills.  HENT: Positive for rhinorrhea and sore throat.   Respiratory: Positive for cough and shortness of breath (only with coughing).   Cardiovascular: Negative for chest pain.   All other systems are reviewed and are negative for acute change except as noted in the HPI  Physical Exam Vital Signs  I have reviewed the triage vital signs BP (!) 147/59   Pulse (!) 51   Temp (!) 97.5 F (36.4 C)   Resp 20   Ht 5\' 10"  (1.778 m)  Wt 90.3 kg   SpO2 95%   BMI 28.55 kg/m   Physical Exam  Constitutional: She is oriented to person, place, and time. She appears well-developed and well-nourished. No distress.  HENT:  Head: Normocephalic and atraumatic.  Nose: Nose normal.  Mouth/Throat: Oropharynx is clear and moist and mucous membranes are normal.  Eyes: Pupils are equal, round, and reactive to light. Conjunctivae and EOM are normal. Right eye exhibits no discharge. Left eye exhibits no discharge. No scleral icterus.  Neck: Normal range of motion. Neck supple.  Cardiovascular: Normal rate and regular rhythm. Exam reveals no gallop and no friction rub.  No murmur heard. Pulmonary/Chest: Effort normal. No stridor. No respiratory distress. She has no wheezes. She has rhonchi (throughout). She has no rales.  Abdominal: Soft. She exhibits no distension. There is no tenderness.  Musculoskeletal: She exhibits no edema or tenderness.  Right lower extremity nonpitting edema (chronic and unchanged per patient)  Neurological: She is alert and oriented to person, place, and time.  Skin: Skin is warm and dry. No  rash noted. She is not diaphoretic. No erythema.  Psychiatric: She has a normal mood and affect.  Vitals reviewed.   ED Results and Treatments Labs (all labs ordered are listed, but only abnormal results are displayed) Labs Reviewed  URINALYSIS, ROUTINE W REFLEX MICROSCOPIC - Abnormal; Notable for the following components:      Result Value   Hgb urine dipstick SMALL (*)    Protein, ur 100 (*)    Bacteria, UA RARE (*)    All other components within normal limits                                                                                                                         EKG  EKG Interpretation  Date/Time:    Ventricular Rate:    PR Interval:    QRS Duration:   QT Interval:    QTC Calculation:   R Axis:     Text Interpretation:        Radiology Dg Chest 2 View  Result Date: 04/14/2018 CLINICAL DATA:  72 year old female with cough. EXAM: CHEST - 2 VIEW COMPARISON:  None. FINDINGS: Mild emphysema, interstitial coarsening and bronchitic changes. No focal consolidation, pleural effusion, or pneumothorax. Borderline cardiomegaly. No acute osseous pathology. IMPRESSION: No active cardiopulmonary disease. Electronically Signed   By: Elgie Collard M.D.   On: 04/14/2018 00:12   Pertinent labs & imaging results that were available during my care of the patient were reviewed by me and considered in my medical decision making (see chart for details).  Medications Ordered in ED Medications  albuterol (PROVENTIL HFA;VENTOLIN HFA) 108 (90 Base) MCG/ACT inhaler 2 puff (2 puffs Inhalation Given 04/13/18 2349)  dexamethasone (DECADRON) tablet 10 mg (10 mg Oral Given 04/13/18 2349)  Procedures Procedures  (including critical care time)  Medical Decision Making / ED Course I have reviewed the nursing notes for this encounter and the patient's prior  records (if available in EHR or on provided paperwork).    Patient presents with 2 weeks of gradually worsening cough.  Lungs with coarse rales and rhonchi throughout.  She is currently afebrile with stable vital signs and no respiratory distress.  Clinical presentation most suspicious for bronchitis.  Will obtain a chest x-ray to rule out pneumonia.  Chest x-ray consistent with bronchitis.  No focal pneumonia noted.  No pleural effusions or pulmonary edema.  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  Other than the right lower extremity swelling which is chronic for the patient she has no lower extremity edema that would be suspicious for heart failure.  She was treated symptomatically with albuterol and also given Decadron.  The patient appears reasonably screened and/or stabilized for discharge and I doubt any other medical condition or other [facility] requiring further screening, evaluation, or treatment in the ED at this time prior to discharge.  The patient is safe for discharge with strict return precautions.     Final Clinical Impression(s) / ED Diagnoses Final diagnoses:  Shortness of breath  History of DVT in adulthood  Cough   Disposition: Discharge  Condition: Good  I have discussed the results, Dx and Tx plan with the patient who expressed understanding and agree(s) with the plan. Discharge instructions discussed at great length. The patient was given strict return precautions who verbalized understanding of the instructions. No further questions at time of discharge.    ED Discharge Orders         Ordered    benzonatate (TESSALON) 200 MG capsule  3 times daily PRN     04/14/18 0117    guaiFENesin-codeine 100-10 MG/5ML syrup  Every 6 hours PRN     04/14/18 0117           Follow Up: Primary care provider   If you do not have a primary care physician, contact [facility] at [phone] for  referral      This chart was dictated using voice recognition software.  Despite best efforts to proofread,  errors can occur which can change the documentation meaning.   Nira Conn, MD 04/14/18 [phone]

## 2018-04-14 MED ORDER — GUAIFENESIN-CODEINE 100-10 MG/5ML PO SOLN: 5.0000 mL | Freq: Four times a day (QID) | ORAL | 0 refills | Status: AC | PRN

## 2018-04-14 MED ORDER — BENZONATATE 200 MG PO CAPS: 200.0000 mg | ORAL_CAPSULE | Freq: Three times a day (TID) | ORAL | 0 refills | Status: AC | PRN

## 2018-04-18 ENCOUNTER — Encounter (HOSPITAL_COMMUNITY): Payer: Self-pay

## 2018-04-18 ENCOUNTER — Ambulatory Visit (HOSPITAL_COMMUNITY)
Admission: EM | Admit: 2018-04-18 | Discharge: 2018-04-18 | Disposition: A | Discharge status: Home / Self Care | Payer: Medicare Other | Attending: Emergency Medicine | Admitting: Emergency Medicine

## 2018-04-18 DIAGNOSIS — J209 Acute bronchitis, unspecified: Secondary | ICD-10-CM | POA: Diagnosis not present

## 2018-04-18 MED ORDER — AEROCHAMBER PLUS MISC: 2 refills | Status: DC

## 2018-04-18 NOTE — ED Provider Notes (Signed)
HPI  SUBJECTIVE:  [name] is a 72 y.o. female who presents for follow-up of bronchitis. She was seen in the ER 5 days ago for shortness of breath, cough, thought to have bronchitis.  X-ray was negative for pneumonia.  She was given Decadron, albuterol and sent home with Tessalon and Tussionex.  She states that she is overall getting better, but she says that when she tried calling the phone number to find a PCP, she was directed to come to the urgent care center.  She is requesting help to find a primary care physician.  She states that she has had a cough occasionally productive of white mucus for 2 weeks, with wheezing, shortness of breath, dyspnea on exertion.  States that she is unable to sleep at night because of the cough.  She denies new or different lower extremity edema, PND, orthopnea, nocturia, unintentional weight gain, calf pain, abdominal pain.  She was given Decadron, and albuterol treatment in the ER with improvement in her symptoms.  she states that she is using the albuterol as needed, Tessalon and Cheratussin are also helping her cough.  No aggravating factors.  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  She is a former smoker.  No history of asthma, emphysema, COPD, diabetes, PE.  PMD: None.   Past Medical History:  Diagnosis Date  . Hypertension     Past Surgical History:  Procedure Laterality Date  . NO PAST SURGERIES      History reviewed. No pertinent family history.  Social History   Tobacco Use  . Smoking status: Former Smoker    Last attempt to quit: 05/10/1987    Years since quitting: 30.9  . Smokeless tobacco: Never Used  Substance Use Topics  . Alcohol use: Never    Frequency: Never  . Drug use: Never    No current facility-administered medications for this encounter.   Current Outpatient Medications:  .  benzonatate (TESSALON) 200 MG capsule, Take 1 capsule (200 mg  total) by mouth 3 (three) times daily as needed for up to 7 days for cough., Disp: 20 capsule, Rfl: 0 .  guaiFENesin-dextromethorphan (ROBITUSSIN DM) 100-10 MG/5ML syrup, Take 10 mLs by mouth every 4 (four) hours as needed for cough., Disp: , Rfl:  .  ibuprofen (ADVIL,MOTRIN) 200 MG tablet, Take 1,600 mg by mouth once as needed for moderate pain., Disp: , Rfl:  .  Spacer/Aero-Holding Chambers (AEROCHAMBER PLUS) inhaler, Use as instructed, Disp: 1 each, Rfl: 2  No Known Allergies   ROS  As noted in HPI.   Physical Exam  BP (!) 178/70 (BP Location: Right Arm)   Pulse 60   Temp 98.3 F (36.8 C) (Oral)   Resp 18   SpO2 97%   Constitutional: Well developed, well nourished, no acute distress Eyes:  EOMI, conjunctiva normal bilaterally HENT: Normocephalic, atraumatic,mucus membranes moist no nasal congestion.  No sinus tenderness. Respiratory: Normal inspiratory effort.  Fair air movement.  Positive rhonchi throughout all lung fields. Cardiovascular: Normal rate, regular rhythm, no murmurs, rubs, gallops GI: nondistended skin: No rash, skin intact Musculoskeletal: Right lower extremity swelling, patient states that this is not new.  Negative Homans.  No palpable cord.  Trace pitting edema.  Left lower extremity no edema, no calf tenderness, negative Homans. Neurologic: Alert & oriented x 3, no focal neuro deficits Psychiatric: Speech and behavior appropriate   ED Course   Medications -  No data to display  No orders of the defined types were placed in this encounter.   No results found for this or any previous visit (from the past 24 hour(s)). No results found.  ED Clinical Impression  Acute bronchitis, unspecified organism   ED Assessment/Plan  ER records reviewed.  As noted in HPI.  Presentation consistent with bronchitis.  Doubt pneumonia given normal chest x-ray 5 days ago and clinical improvement. doubt PE the absence of tachycardia, hypoxia.  She states that overall  she is getting better however she comes here today because she was referred here by the number that the ER gave her to help her find a primary care physician.  I did not repeat a chest x-ray today because she is that she is getting better.  Will provide her with a spacer.  She has plenty of albuterol inhaler-dial read 180 puffs.  We will have her do this 2 puffs every 6 hours on a regular basis for the next 3 days, then back off on it and use as needed.  Will provide her primary care referral list and put in an order for primary care as she seems to be having trouble navigating the system.  Patient also requesting a fax number to here.  Discussed  MDM, treatment plan, and plan for follow-up with patient. Discussed sn/sx that should prompt return to the ED. patient agrees with plan.   Meds ordered this encounter  Medications  . Spacer/Aero-Holding Chambers (AEROCHAMBER PLUS) inhaler    Sig: Use as instructed    Dispense:  1 each    Refill:  2    *This clinic note was created using Dragon dictation software. Therefore, there may be occasional mistakes despite careful proofreading.   ?   Domenick GongMortenson, Shanik Brookshire, MD 04/19/18 1300

## 2018-04-18 NOTE — Discharge Instructions (Addendum)
2 puffs from your albuterol inhaler using your spacer every 6 hours on a regular basis for the next 3 days, then you may use the albuterol as needed for coughing, wheezing, shortness of breath.  Continue the Robitussin and the Tessalon as needed for cough.  You may want to try some Mucinex DM as well.  Follow-up with a primary care provider ASAP, see list below.  I am also placing an order to have somebody help you find a primary care physician.  Go to www.goodrx.com to look up your medications. This will give you a list of where you can find your prescriptions at the most affordable prices.    If you have no primary doctor, here are some resources that may be helpful:  - [facility]: [address], [place], [place] [address] [place], [place] [address] [phone]  Medicaid-accepting [facility] Providers: - [facility]- [address]  [phone];   - [facility]- [address] [place], [address] [phone]  - [facility]- [address] [phone]  [facility]- [address] [phone]  - Renaye Rakers- [address] [phone]. Only accepts [place] patients after they have her name applied to their card  -Dr. Greggory Stallion Osei-Bonsu, [facility]. [address]    [place], [place] [address]  [phone]  Self Pay (no insurance) in [place]: - Sickle Cell Patients: Dr Willey Blade, [facility] [address] [place] [phone]  - [phone]  - Physician Referral Service- [phone]  - [facility]- [address], [place], [phone];  Monday to Friday, 9 a.m. - 7 p.m.; Saturday 9 a.m. to 1 p.m.  [facility]- [address] [place], [place] [phone]  - [facility]- [address]  [phone] [facility]- [address] [phone] [facility], [address], [place]; [phone]; or [address] [place]; [phone].   [facility] [place], [place]. [address], [place]; [phone]; Monday to Wednesday, 8:30 a.m. - 5 p.m.; Thursday, 8:30 a.m. - 8 p.m.  [facility], [address], [place]; [phone]; Monday to Friday, 8 a.m. - 4:30 p.m.   [facility], [place] [address] [place], [phone]; first and third Saturday of the month, 9:30 a.m. - 12:30 p.m.  [facility], [address], [place], [phone]; second Saturday of the month, 9 a.m. -noon.  [facility] for children. For information, call [phone]; [phone]; or [phone].  Other agencies that provide inexpensive medical care:     [facility]  [phone]    [facility]  [phone]    [facility]  [phone] [address] [place] [address]    [facility]  [phone]    [facility]  [phone], [phone]; or [phone].  Chronic Pain Problems Contact [name]  [phone] Patients need to be referred by their primary care doctor.  [facility]  [facility] [place]     [employer]                          [facility]. [address]  [place]                       [address]      [address]   [phone] (After Hours)  General Information: Finding a doctor when you do not have health insurance can be tricky. Although you are not limited by an insurance plan, you are of course limited by her finances and how much but he can pay out of pocket.  What are your options if you don't have health insurance?   1) Find a Librarian, academicDoctor and Pay Out of Pocket Although you won't have to find out who is covered by your insurance plan, it is a good  idea to ask around and get recommendations. You will then need to call the office and see if the doctor you have chosen will accept you as a new patient and what types of options they offer for patients who are self-pay. Some doctors offer discounts or will set up payment plans for their patients who do not have insurance, but you will need to ask so you aren't surprised when you get to your appointment.  2) Contact Your Local Health Department Not all health departments have doctors that can see patients for sick visits, but many do, so it is worth a call to see if yours does. If you don't know where your local health department is, you can check in your phone book. The CDC also has a tool to help you locate your state's health department, and many state websites also have listings of all of their local health departments.  3) Find a Walk-in Clinic If your illness is not likely to be very severe or complicated, you may want to try a walk in clinic. These are popping up all over the country in pharmacies, drugstores, and shopping centers. They're usually staffed by nurse practitioners or physician assistants that have been trained to treat common illnesses and complaints. They're usually fairly quick and inexpensive. However, if you have serious medical issues or chronic medical problems, these are probably not your best option

## 2018-04-18 NOTE — ED Notes (Signed)
Patient able to ambulate independently  

## 2018-04-18 NOTE — ED Triage Notes (Signed)
Pt complains of (SOB) for 2 weeks with coughing and congestion.

## 2018-04-25 ENCOUNTER — Encounter (HOSPITAL_COMMUNITY): Payer: Self-pay | Admitting: Emergency Medicine

## 2018-04-25 ENCOUNTER — Other Ambulatory Visit: Payer: Self-pay

## 2018-04-25 ENCOUNTER — Ambulatory Visit (HOSPITAL_COMMUNITY)
Admission: EM | Admit: 2018-04-25 | Discharge: 2018-04-25 | Disposition: A | Discharge status: Home / Self Care | Payer: BLUE CROSS/BLUE SHIELD | Attending: Family Medicine | Admitting: Family Medicine

## 2018-04-25 DIAGNOSIS — J40 Bronchitis, not specified as acute or chronic: Secondary | ICD-10-CM | POA: Diagnosis not present

## 2018-04-25 MED ORDER — PREDNISONE 50 MG PO TABS: ORAL_TABLET | ORAL | 1 refills | Status: DC

## 2018-04-25 NOTE — ED Triage Notes (Signed)
PT reports cough for over a week. PT was on medications, but she has run out. PT endorses SOB with cough

## 2018-04-25 NOTE — ED Provider Notes (Addendum)
[facility]    CSN: [id number] Arrival date & time: 04/25/18  1014     History   Chief Complaint Chief Complaint  Patient presents with  . Appointment  . Cough    HPI [name] is a 72 y.o. female.   This is 72 year old established [place] urgent care patient.  She is complaining about a cough that she has had for over 10 days.  She was seen here on the 11th.  PT was on medications, but she has run out. PT has SOB with cough  She has FMLA papers to be filled out.  She still coughing some with wheezes.  She is taking her inhaler as prescribed.   Note from 12/11: [name] is a 72 y.o. female who presents for follow-up of bronchitis. She was seen in the ER 5 days ago for shortness of breath, cough, thought to have bronchitis.  X-ray was negative for pneumonia.  She was given Decadron, albuterol and sent home with Tessalon and Tussionex.  She states that she is overall getting better, but she says that when she tried calling the phone number to find a PCP, she was directed to come to the urgent care center.  She is requesting help to find a primary care physician.  She states that she has had a cough occasionally productive of white mucus for 2 weeks, with wheezing, shortness of breath, dyspnea on exertion.  States that she is unable to sleep at night because of the cough.  She denies new or different lower extremity edema, PND, orthopnea, nocturia, unintentional weight gain, calf pain, abdominal pain.  She was given Decadron, and albuterol treatment in the ER with improvement in her symptoms.  she states that she is using the albuterol as needed, Tessalon and Cheratussin are also helping her cough.  No aggravating factors.  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  She is a former smoker.  No history of asthma, emphysema, COPD, diabetes, PE.  PMD: None.      Past Medical History:    Diagnosis Date  . Hypertension     There are no active problems to display for this patient.   Past Surgical History:  Procedure Laterality Date  . NO PAST SURGERIES      OB History    Gravida  9   Para      Term      Preterm      AB  1   Living  8     SAB      TAB      Ectopic      Multiple      Live Births  8            Home Medications    Prior to Admission medications   Medication Sig Start Date End Date Taking? Authorizing Provider  ibuprofen (ADVIL,MOTRIN) 200 MG tablet Take 1,600 mg by mouth once as needed for moderate pain.    [provider]  predniSONE (DELTASONE) 50 MG tablet One daily with food 04/25/18   Elvina SidleLauenstein, Shalandria Elsbernd, MD  Spacer/Aero-Holding Chambers (AEROCHAMBER PLUS) inhaler Use as instructed 04/18/18   Domenick GongMortenson, Ashley, MD    Family History No family history on file.  Social History Social History   Tobacco Use  . Smoking status: Former Smoker    Last attempt to quit: 05/10/1987    Years since quitting: 30.9  .  Smokeless tobacco: Never Used  Substance Use Topics  . Alcohol use: Never    Frequency: Never  . Drug use: Never     Allergies   Patient has no known allergies.   Review of Systems Review of Systems   Physical Exam Triage Vital Signs ED Triage Vitals  Enc Vitals Group     BP 04/25/18 1030 (!) 195/84     Pulse Rate 04/25/18 1029 75     Resp 04/25/18 1029 20     Temp 04/25/18 1029 98.6 F (37 C)     Temp Source 04/25/18 1029 Oral     SpO2 04/25/18 1030 96 %     Weight --      Height --      Head Circumference --      Peak Flow --      Pain Score 04/25/18 1030 1     Pain Loc --      Pain Edu? --      Excl. in GC? --    No data found.  Updated Vital Signs BP (!) 195/84   Pulse 75   Temp 98.6 F (37 C) (Oral)   Resp 20   SpO2 96%    Physical Exam Vitals signs and nursing note reviewed.  Constitutional:      General: She is not in acute distress.    Appearance: Normal  appearance. She is not ill-appearing.  HENT:     Head: Normocephalic.     Right Ear: Tympanic membrane and external ear normal.     Left Ear: Tympanic membrane and external ear normal.     Nose: Congestion present.     Mouth/Throat:     Mouth: Mucous membranes are moist.  Eyes:     Conjunctiva/sclera: Conjunctivae normal.     Pupils: Pupils are equal, round, and reactive to light.  Cardiovascular:     Rate and Rhythm: Normal rate.     Heart sounds: Normal heart sounds.  Pulmonary:     Effort: Pulmonary effort is normal.     Breath sounds: Wheezing present.  Musculoskeletal: Normal range of motion.  Skin:    General: Skin is warm and dry.  Neurological:     General: No focal deficit present.     Mental Status: She is alert.  Psychiatric:        Mood and Affect: Mood normal.        Behavior: Behavior normal.        Thought Content: Thought content normal.   FMLA papers completed.  Patient is to return to work this Saturday.   UC Treatments / Results  Labs (all labs ordered are listed, but only abnormal results are displayed) Labs Reviewed - No data to display  EKG None  Radiology No results found.  Procedures Procedures (including critical care time)  Medications Ordered in UC Medications - No data to display  Initial Impression / Assessment and Plan / UC Course  I have reviewed the triage vital signs and the nursing notes.  Pertinent labs & imaging results that were available during my care of the patient were reviewed by me and considered in my medical decision making (see chart for details).     Final Clinical Impressions(s) / UC Diagnoses   Final diagnoses:  Bronchitis   Discharge Instructions   None    ED Prescriptions    Medication Sig Dispense Auth. Provider   predniSONE (DELTASONE) 50 MG tablet One daily with food 5 tablet Elvina Sidle,  MD     Controlled Substance Prescriptions  Controlled Substance Registry consulted? Not Applicable    Elvina Sidle, MD 04/25/18 1046    Elvina Sidle, MD 04/25/18 1048

## 2019-10-11 ENCOUNTER — Encounter: Payer: Self-pay | Admitting: Internal Medicine

## 2019-10-17 ENCOUNTER — Telehealth: Payer: Medicare Other | Admitting: Internal Medicine

## 2020-09-10 ENCOUNTER — Encounter (HOSPITAL_COMMUNITY): Payer: Self-pay

## 2020-09-10 ENCOUNTER — Inpatient Hospital Stay (HOSPITAL_COMMUNITY): Payer: Medicare Other

## 2020-09-10 ENCOUNTER — Emergency Department (HOSPITAL_COMMUNITY): Payer: Medicare Other

## 2020-09-10 ENCOUNTER — Other Ambulatory Visit: Payer: Self-pay

## 2020-09-10 ENCOUNTER — Inpatient Hospital Stay (HOSPITAL_COMMUNITY)
Admission: EM | Admit: 2020-09-10 | Discharge: 2020-09-16 | DRG: 023 | Disposition: A | Discharge status: Rehabilitation Facility | Payer: Medicare Other | Attending: Family Medicine | Admitting: Family Medicine

## 2020-09-10 DIAGNOSIS — I6300 Cerebral infarction due to thrombosis of unspecified precerebral artery: Secondary | ICD-10-CM | POA: Diagnosis not present

## 2020-09-10 DIAGNOSIS — Z20822 Contact with and (suspected) exposure to covid-19: Secondary | ICD-10-CM | POA: Diagnosis present

## 2020-09-10 DIAGNOSIS — G459 Transient cerebral ischemic attack, unspecified: Secondary | ICD-10-CM | POA: Diagnosis present

## 2020-09-10 DIAGNOSIS — I10 Essential (primary) hypertension: Secondary | ICD-10-CM | POA: Diagnosis present

## 2020-09-10 DIAGNOSIS — J449 Chronic obstructive pulmonary disease, unspecified: Secondary | ICD-10-CM | POA: Diagnosis present

## 2020-09-10 DIAGNOSIS — R29703 NIHSS score 3: Secondary | ICD-10-CM | POA: Diagnosis present

## 2020-09-10 DIAGNOSIS — Z87891 Personal history of nicotine dependence: Secondary | ICD-10-CM

## 2020-09-10 DIAGNOSIS — G8194 Hemiplegia, unspecified affecting left nondominant side: Secondary | ICD-10-CM | POA: Diagnosis present

## 2020-09-10 DIAGNOSIS — I63511 Cerebral infarction due to unspecified occlusion or stenosis of right middle cerebral artery: Principal | ICD-10-CM | POA: Diagnosis present

## 2020-09-10 DIAGNOSIS — Z86718 Personal history of other venous thrombosis and embolism: Secondary | ICD-10-CM

## 2020-09-10 DIAGNOSIS — I615 Nontraumatic intracerebral hemorrhage, intraventricular: Secondary | ICD-10-CM

## 2020-09-10 DIAGNOSIS — R9431 Abnormal electrocardiogram [ECG] [EKG]: Secondary | ICD-10-CM | POA: Diagnosis present

## 2020-09-10 DIAGNOSIS — Z8709 Personal history of other diseases of the respiratory system: Secondary | ICD-10-CM

## 2020-09-10 DIAGNOSIS — I6523 Occlusion and stenosis of bilateral carotid arteries: Secondary | ICD-10-CM | POA: Diagnosis present

## 2020-09-10 DIAGNOSIS — I252 Old myocardial infarction: Secondary | ICD-10-CM | POA: Diagnosis not present

## 2020-09-10 DIAGNOSIS — I716 Thoracoabdominal aortic aneurysm, without rupture: Secondary | ICD-10-CM | POA: Diagnosis present

## 2020-09-10 DIAGNOSIS — Z9114 Patient's other noncompliance with medication regimen: Secondary | ICD-10-CM

## 2020-09-10 DIAGNOSIS — I639 Cerebral infarction, unspecified: Secondary | ICD-10-CM | POA: Diagnosis not present

## 2020-09-10 DIAGNOSIS — M25511 Pain in right shoulder: Secondary | ICD-10-CM | POA: Diagnosis not present

## 2020-09-10 DIAGNOSIS — E785 Hyperlipidemia, unspecified: Secondary | ICD-10-CM

## 2020-09-10 DIAGNOSIS — D62 Acute posthemorrhagic anemia: Secondary | ICD-10-CM | POA: Diagnosis not present

## 2020-09-10 DIAGNOSIS — R42 Dizziness and giddiness: Secondary | ICD-10-CM | POA: Diagnosis not present

## 2020-09-10 DIAGNOSIS — I6521 Occlusion and stenosis of right carotid artery: Secondary | ICD-10-CM | POA: Diagnosis not present

## 2020-09-10 DIAGNOSIS — R414 Neurologic neglect syndrome: Secondary | ICD-10-CM | POA: Diagnosis present

## 2020-09-10 DIAGNOSIS — I63519 Cerebral infarction due to unspecified occlusion or stenosis of unspecified middle cerebral artery: Secondary | ICD-10-CM | POA: Diagnosis not present

## 2020-09-10 DIAGNOSIS — R29810 Facial weakness: Secondary | ICD-10-CM | POA: Diagnosis present

## 2020-09-10 DIAGNOSIS — I609 Nontraumatic subarachnoid hemorrhage, unspecified: Secondary | ICD-10-CM

## 2020-09-10 DIAGNOSIS — I610 Nontraumatic intracerebral hemorrhage in hemisphere, subcortical: Secondary | ICD-10-CM | POA: Diagnosis not present

## 2020-09-10 DIAGNOSIS — I69354 Hemiplegia and hemiparesis following cerebral infarction affecting left non-dominant side: Secondary | ICD-10-CM | POA: Diagnosis not present

## 2020-09-10 HISTORY — DX: Transient cerebral ischemic attack, unspecified: G45.9

## 2020-09-10 LAB — DIFFERENTIAL
Abs Immature Granulocytes: 0.01 10*3/uL (ref 0.00–0.07)
Basophils Absolute: 0 10*3/uL (ref 0.0–0.1)
Basophils Relative: 1 %
Eosinophils Absolute: 0.3 10*3/uL (ref 0.0–0.5)
Eosinophils Relative: 8 %
Immature Granulocytes: 0 %
Lymphocytes Relative: 36 %
Lymphs Abs: 1.6 10*3/uL (ref 0.7–4.0)
Monocytes Absolute: 0.5 10*3/uL (ref 0.1–1.0)
Monocytes Relative: 12 %
Neutro Abs: 1.9 10*3/uL (ref 1.7–7.7)
Neutrophils Relative %: 43 %

## 2020-09-10 LAB — COMPREHENSIVE METABOLIC PANEL
ALT: 8 U/L (ref 0–44)
AST: 17 U/L (ref 15–41)
Albumin: 3.6 g/dL (ref 3.5–5.0)
Alkaline Phosphatase: 67 U/L (ref 38–126)
Anion gap: 9 (ref 5–15)
BUN: 18 mg/dL (ref 8–23)
CO2: 26 mmol/L (ref 22–32)
Calcium: 8.7 mg/dL — ABNORMAL LOW (ref 8.9–10.3)
Chloride: 106 mmol/L (ref 98–111)
Creatinine, Ser: 0.71 mg/dL (ref 0.44–1.00)
GFR, Estimated: >60 mL/min (ref >60)
Glucose, Bld: 88 mg/dL (ref 70–99)
Potassium: 3.3 mmol/L — ABNORMAL LOW (ref 3.5–5.1)
Sodium: 141 mmol/L (ref 135–145)
Total Bilirubin: 0.4 mg/dL (ref 0.3–1.2)
Total Protein: 7.1 g/dL (ref 6.5–8.1)

## 2020-09-10 LAB — I-STAT CHEM 8, ED
BUN: 20 mg/dL (ref 8–23)
Calcium, Ion: 1.16 mmol/L (ref 1.15–1.40)
Chloride: 105 mmol/L (ref 98–111)
Creatinine, Ser: 0.9 mg/dL (ref 0.44–1.00)
Glucose, Bld: 84 mg/dL (ref 70–99)
HCT: 37 % (ref 36.0–46.0)
Hemoglobin: 12.6 g/dL (ref 12.0–15.0)
Potassium: 3.7 mmol/L (ref 3.5–5.1)
Sodium: 143 mmol/L (ref 135–145)
TCO2: 28 mmol/L (ref 22–32)

## 2020-09-10 LAB — CBC
HCT: 38.3 % (ref 36.0–46.0)
Hemoglobin: 12.2 g/dL (ref 12.0–15.0)
MCH: 28.8 pg (ref 26.0–34.0)
MCHC: 31.9 g/dL (ref 30.0–36.0)
MCV: 90.5 fL (ref 80.0–100.0)
Platelets: 141 10*3/uL — ABNORMAL LOW (ref 150–400)
RBC: 4.23 MIL/uL (ref 3.87–5.11)
RDW: 13.1 % (ref 11.5–15.5)
WBC: 4.4 10*3/uL (ref 4.0–10.5)
nRBC: 0 % (ref 0.0–0.2)

## 2020-09-10 LAB — RESP PANEL BY RT-PCR (FLU A&B, COVID) ARPGX2
Influenza A by PCR: NEGATIVE
Influenza B by PCR: NEGATIVE
SARS Coronavirus 2 by RT PCR: NEGATIVE

## 2020-09-10 LAB — PROTIME-INR
INR: 1 (ref 0.8–1.2)
Prothrombin Time: 13.1 seconds (ref 11.4–15.2)

## 2020-09-10 LAB — CBG MONITORING, ED: Glucose-Capillary: 87 mg/dL (ref 70–99)

## 2020-09-10 LAB — ETHANOL: Alcohol, Ethyl (B): <10 mg/dL (ref <10)

## 2020-09-10 LAB — APTT: aPTT: 26 seconds (ref 24–36)

## 2020-09-10 MED ORDER — STROKE: EARLY STAGES OF RECOVERY BOOK
Freq: Once | Status: AC
  Filled 2020-09-10: qty 1

## 2020-09-10 MED ORDER — ACETAMINOPHEN 325 MG PO TABS
650.0000 mg | ORAL_TABLET | ORAL | Status: DC | PRN
  Administered 2020-09-14: 650 mg via ORAL
  Filled 2020-09-10 (×2): qty 2

## 2020-09-10 MED ORDER — SODIUM CHLORIDE 0.9 % IV SOLN: INTRAVENOUS | Status: DC

## 2020-09-10 MED ORDER — ASPIRIN 325 MG PO TABS
325.0000 mg | ORAL_TABLET | Freq: Once | ORAL | Status: AC
  Administered 2020-09-10: 325 mg via ORAL
  Filled 2020-09-10: qty 1

## 2020-09-10 MED ORDER — CLOPIDOGREL BISULFATE 300 MG PO TABS
300.0000 mg | ORAL_TABLET | Freq: Once | ORAL | Status: AC
  Administered 2020-09-10: 300 mg via ORAL
  Filled 2020-09-10: qty 1

## 2020-09-10 MED ORDER — ASPIRIN EC 81 MG PO TBEC
81.0000 mg | DELAYED_RELEASE_TABLET | Freq: Every day | ORAL | Status: DC
  Administered 2020-09-11 – 2020-09-14 (×3): 81 mg via ORAL
  Filled 2020-09-10 (×3): qty 1

## 2020-09-10 MED ORDER — IOHEXOL 350 MG/ML SOLN
100.0000 mL | Freq: Once | INTRAVENOUS | Status: AC | PRN
  Administered 2020-09-10: 75 mL via INTRAVENOUS

## 2020-09-10 MED ORDER — ACETAMINOPHEN 650 MG RE SUPP: 650.0000 mg | RECTAL | Status: DC | PRN

## 2020-09-10 MED ORDER — CLOPIDOGREL BISULFATE 75 MG PO TABS
75.0000 mg | ORAL_TABLET | Freq: Every day | ORAL | Status: DC
  Administered 2020-09-11 – 2020-09-14 (×3): 75 mg via ORAL
  Filled 2020-09-10 (×3): qty 1

## 2020-09-10 MED ORDER — ACETAMINOPHEN 160 MG/5ML PO SOLN: 650.0000 mg | ORAL | Status: DC | PRN

## 2020-09-10 MED ORDER — ATORVASTATIN CALCIUM 80 MG PO TABS
80.0000 mg | ORAL_TABLET | Freq: Every day | ORAL | Status: DC
  Administered 2020-09-10 – 2020-09-15 (×4): 80 mg via ORAL
  Filled 2020-09-10 (×3): qty 1
  Filled 2020-09-10: qty 2

## 2020-09-10 MED ORDER — SENNOSIDES-DOCUSATE SODIUM 8.6-50 MG PO TABS: 1.0000 | ORAL_TABLET | Freq: Every evening | ORAL | Status: DC | PRN

## 2020-09-10 NOTE — ED Notes (Signed)
Pt to MRI

## 2020-09-10 NOTE — H&P (Signed)
History and Physical    Ambert Virrueta LKG:[id number] DOB: 1945-08-11 DOA: 09/10/2020  PCP: Patient, No Pcp Per (Inactive)   Patient coming from:  Home  I have personally briefly reviewed patient's old medical records in [facility]  Chief Complaint:  Dizziness and Unsteady Gait  HPI: Shakhia Gramajo is a 75 y.o. female with medical history significant of HTN, not on any antihypertensive medications.  Patient reports she is from [place] and last PCP visit was more than a year ago and she is without medication for that duration.  She complains of having dizziness and unsteady gait for 2 weeks which has been worse in the morning today which brought her to the emergency room.  After she arrived in the ED she has developed left-sided weakness, left-sided facial droop and left neglect.  Stroke code was activated. Exact time of onset of symptoms unknown since patient has been having unsteady gait for few days, Patient also reports intermittent headache because of uncontrolled hypertension.  Patient symptoms has resolved within an hour.  Patient was seen by tele neurologist Dr. Iver Nestle, recommended  stroke work-up, dual antiplatelet agents , aspirin and Plavix, statins.  Patient denies any recent travel, sick contacts, cough, weight loss, chest pain, shortness of breath.   ED Course: She was hemodynamically stable except hypertension.   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 At least partial reconstitution at the clinoid  intracranially. Less than 50% stenosis at the left ICA origin.  Review of Systems: Review of Systems  Constitutional: Negative.   HENT: Negative.   Eyes: Negative.   Respiratory: Negative.   Cardiovascular: Negative.   Gastrointestinal: Negative.   Genitourinary: Negative.   Musculoskeletal: Negative.   Skin: Negative.   Neurological: Positive for dizziness, focal weakness, weakness and headaches.  Endo/Heme/Allergies: Negative.   Psychiatric/Behavioral: Negative.     Past Medical History:  Diagnosis Date  . COPD (chronic obstructive pulmonary disease) (HCC)   . History of DVT (deep vein thrombosis)   . Hypertension   . 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 NO PAST SURGERIES       reports that she quit smoking about 33 years ago. She has never used smokeless tobacco. She reports that she does not drink alcohol and does not use drugs.  No Known Allergies  History reviewed. No pertinent family history.   Family history reviewed and not pertinent   Prior to Admission medications   Medication Sig Start Date End Date Taking? Authorizing Provider  ibuprofen (ADVIL) 200 MG tablet Take 200 mg by mouth every 6 (six) hours as needed for headache.   Yes [provider]    Physical Exam: Vitals:   09/10/20 1630 09/10/20 1645 09/10/20 1715 09/10/20 1730  BP: (!) 212/98 (!) 221/99 (!) 204/90 (!) 205/95  Pulse: 66 66 (!) 55 64  Resp: 14 20 13  (!) 22  Temp:      TempSrc:      SpO2: 99% 98% 98% 98%  Weight:  Height:        Constitutional: NAD, calm, comfortable Vitals:   09/10/20 1630 09/10/20 1645 09/10/20 1715 09/10/20 1730  BP: (!) 212/98 (!) 221/99 (!) 204/90 (!) 205/95  Pulse: 66 66 (!) 55 64  Resp: 14 20 13  (!) 22  Temp:      TempSrc:      SpO2: 99% 98% 98% 98%  Weight:      Height:       Eyes: PERRL, lids and conjunctivae normal ENMT: Mucous membranes are moist. Posterior pharynx clear  of any exudate or lesions.Normal dentition.  Neck: normal, supple, no masses, no thyromegaly Respiratory: clear to auscultation bilaterally, no wheezing, no crackles. Normal respiratory effort. No accessory muscle use.  Cardiovascular: Regular rate and rhythm, no murmurs / rubs / gallops. No extremity edema. 2+ pedal pulses. No carotid bruits.  Abdomen: no tenderness, no masses palpated. No hepatosplenomegaly. Bowel sounds positive.  Musculoskeletal: no clubbing / cyanosis. No joint deformity upper and lower extremities. Good ROM, no contractures. Normal muscle tone.  Skin: no rashes, lesions, ulcers. No induration. Neurologic: CN 2-12 grossly intact. Sensation intact, DTR normal. Strength 5/5 in all 4.  Psychiatric: Normal judgment and insight. Alert and oriented x 3. Normal mood.   Labs on Admission: I have personally reviewed following labs and imaging studies  CBC: Recent Labs  Lab 09/10/20 1540 09/10/20 1559  WBC 4.4  --   NEUTROABS 1.9  --   HGB 12.2 12.6  HCT 38.3 37.0  MCV 90.5  --   PLT 141*  --    Basic Metabolic Panel: Recent Labs  Lab 09/10/20 1540 09/10/20 1559  NA 141 143  K 3.3* 3.7  CL 106 105  CO2 26  --   GLUCOSE 88 84  BUN 18 20  CREATININE 0.71 0.90  CALCIUM 8.7*  --    GFR: Estimated Creatinine Clearance: 66.5 mL/min (by C-G formula based on SCr of 0.9 mg/dL). Liver Function Tests: Recent Labs  Lab 09/10/20 1540  AST 17  ALT 8  ALKPHOS 67  BILITOT 0.4  PROT 7.1  ALBUMIN 3.6   No results for input(s): LIPASE, AMYLASE in the last 168 hours. No results for input(s): AMMONIA in the last 168 hours. Coagulation Profile: Recent Labs  Lab 09/10/20 1540  INR 1.0   Cardiac Enzymes: No results for input(s): CKTOTAL, CKMB, CKMBINDEX, TROPONINI in the last 168 hours. BNP (last 3 results) No results for input(s): PROBNP in the last 8760 hours. HbA1C: No results for input(s): HGBA1C in the last 72 hours. CBG: Recent Labs  Lab 09/10/20 1544   GLUCAP 87   Lipid Profile: No results for input(s): CHOL, HDL, LDLCALC, TRIG, CHOLHDL, LDLDIRECT in the last 72 hours. Thyroid Function Tests: No results for input(s): TSH, T4TOTAL, FREET4, T3FREE, THYROIDAB in the last 72 hours. Anemia Panel: No results for input(s): VITAMINB12, FOLATE, FERRITIN, TIBC, IRON, RETICCTPCT in the last 72 hours. Urine analysis:    Component Value Date/Time   COLORURINE YELLOW 04/13/2018 2036   APPEARANCEUR CLEAR 04/13/2018 2036   LABSPEC 1.028 04/13/2018 2036   PHURINE 5.0 04/13/2018 2036   GLUCOSEU NEGATIVE 04/13/2018 2036   HGBUR SMALL (A) 04/13/2018 2036   BILIRUBINUR NEGATIVE 04/13/2018 2036   KETONESUR NEGATIVE 04/13/2018 2036   PROTEINUR 100 (A) 04/13/2018 2036   UROBILINOGEN 1.0 11/15/2010 2015   NITRITE NEGATIVE 04/13/2018 2036   LEUKOCYTESUR NEGATIVE 04/13/2018 2036    Radiological Exams on Admission: CT Angio Head W or Wo Contrast  Result Date:  09/10/2020 CLINICAL DATA:  Right gaze preference and left facial droop EXAM: CT ANGIOGRAPHY HEAD AND NECK TECHNIQUE: Multidetector CT imaging of the head and neck was performed using the standard protocol during bolus administration of intravenous contrast. Multiplanar CT image reconstructions and MIPs were obtained to evaluate the vascular anatomy. Carotid stenosis measurements (when applicable) are obtained utilizing NASCET criteria, using the distal internal carotid diameter as the denominator. CONTRAST:  75mL OMNIPAQUE IOHEXOL 350 MG/ML SOLN COMPARISON:  None. FINDINGS: CTA NECK Aortic arch: Eccentric noncalcified plaque along the visualized distal aortic arch. Great vessel origins are patent. No high-grade proximal subclavian stenosis. Right carotid system: Common carotid is patent. There is mixed plaque at the bifurcation. ICA is occluded just beyond the origin. ECA is patent. Left carotid system: Patent. Atherosclerotic wall thickening along the common carotid. Mixed but primarily calcified plaque at  the bifurcation and along the proximal internal carotid. There is less than 50% stenosis. Vertebral arteries: Patent. Left vertebral artery slightly dominant. Mild plaque at the origins. Skeleton: Degenerative changes of the included spine. Other neck: No acute abnormality. Upper chest: No apical lung mass. Review of the MIP images confirms the above findings CTA HEAD Anterior circulation: At least partial reconstitution of the right ICA at the clinoid where there is calcified plaque. Right anterior and middle cerebral arteries are patent. Atherosclerotic irregularity of the right M1 MCA with mild and moderate stenoses. Mild distal branch atherosclerotic irregularity. Left intracranial internal carotid arteries patent with calcified plaque causing mild stenosis. Left anterior and middle cerebral arteries are patent. Atherosclerotic irregularity of the left M1 MCA with mild stenosis. Mild distal branch atherosclerotic irregularity. Posterior circulation: Intracranial vertebral arteries are patent. Basilar artery is patent. Major cerebellar artery origins are patent. Posterior cerebral arteries are patent. Focal moderate stenosis of the right P2 PCA. A right posterior communicating artery is present. Venous sinuses: Patent as allowed by contrast bolus timing. 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 At least partial reconstitution at the clinoid intracranially. Less than 50% stenosis at the left ICA origin. Mild to moderate intracranial atherosclerosis. Results were discussed by telephone at the time of interpretation on 09/10/2020 at 4:34 pm with provider Dr. Iver Nestle, who verbally acknowledged these results. Electronically Signed   By: Guadlupe Spanish M.D.   On: 09/10/2020 16:37   CT Angio Neck W and/or Wo Contrast  Result Date: 09/10/2020 CLINICAL DATA:  Right gaze preference and left facial droop EXAM: CT  ANGIOGRAPHY HEAD AND NECK TECHNIQUE: Multidetector CT imaging of the head and neck was performed using the standard protocol during bolus administration of intravenous contrast. Multiplanar CT image reconstructions and MIPs were obtained to evaluate the vascular anatomy. Carotid stenosis measurements (when applicable) are obtained utilizing NASCET criteria, using the distal internal carotid diameter as the denominator. CONTRAST:  75mL OMNIPAQUE IOHEXOL 350 MG/ML SOLN COMPARISON:  None. FINDINGS: CTA NECK Aortic arch: Eccentric noncalcified plaque along the visualized distal aortic arch. Great vessel origins are patent. No high-grade proximal subclavian stenosis. Right carotid system: Common carotid is patent. There is mixed plaque at the bifurcation. ICA is occluded just beyond the origin. ECA is patent. Left carotid system: Patent. Atherosclerotic wall thickening along the common carotid. Mixed but primarily calcified plaque at the bifurcation and along the proximal internal carotid. There is less than 50% stenosis. Vertebral arteries: Patent. Left vertebral artery slightly dominant. Mild plaque at the origins. Skeleton: Degenerative changes of the included  spine. Other neck: No acute abnormality. Upper chest: No apical lung mass. Review of the MIP images confirms the above findings CTA HEAD Anterior circulation: At least partial reconstitution of the right ICA at the clinoid where there is calcified plaque. Right anterior and middle cerebral arteries are patent. Atherosclerotic irregularity of the right M1 MCA with mild and moderate stenoses. Mild distal branch atherosclerotic irregularity. Left intracranial internal carotid arteries patent with calcified plaque causing mild stenosis. Left anterior and middle cerebral arteries are patent. Atherosclerotic irregularity of the left M1 MCA with mild stenosis. Mild distal branch atherosclerotic irregularity. Posterior circulation: Intracranial vertebral arteries are  patent. Basilar artery is patent. Major cerebellar artery origins are patent. Posterior cerebral arteries are patent. Focal moderate stenosis of the right P2 PCA. A right posterior communicating artery is present. Venous sinuses: Patent as allowed by contrast bolus timing. 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 At least partial reconstitution at the clinoid intracranially. Less than 50% stenosis at the left ICA origin. Mild to moderate intracranial atherosclerosis. Results were discussed by telephone at the time of interpretation on 09/10/2020 at 4:34 pm with provider Dr. Iver NestleBhagat, who verbally acknowledged these results. Electronically Signed   By: Guadlupe SpanishPraneil  Patel M.D.   On: 09/10/2020 16:37   CT HEAD CODE STROKE WO CONTRAST  Result Date: 09/10/2020 CLINICAL DATA:  Code stroke.  Acute neuro deficit EXAM: CT HEAD WITHOUT CONTRAST TECHNIQUE: Contiguous axial images were obtained from the base of the skull through the vertex without intravenous contrast. COMPARISON:  None. FINDINGS: Brain: Ventricle size normal. Mild atrophy. Chronic infarct right parietal lobe. Mild white matter hypodensity bilaterally most consistent with microvascular ischemia Vascular: Negative for hyperdense vessel Skull: Negative Sinuses/Orbits: Mucosal edema in the paranasal sinuses. Air-fluid levels in both maxillary sinus. Mastoid clear bilaterally. Other: None ASPECTS (Alberta Stroke Program Early CT Score) - Ganglionic level infarction (caudate, lentiform nuclei, internal capsule, insula, M1-M3 cortex): 7 - Supraganglionic infarction (M4-M6 cortex): 3 Total score (0-10 with 10 being normal): 10 IMPRESSION: 1. No acute abnormality. 2. Chronic infarct right parietal lobe. Microvascular ischemic change in the white matter 3. ASPECTS is 10 4. These results were called by telephone at the time of interpretation on 09/10/2020 at 4:07 pm to provider  Effie ShyWentz , who verbally acknowledged these results. Electronically Signed   By: Marlan Palauharles  Clark M.D.   On: 09/10/2020 16:08   EKG: Independently reviewed.,  Prolonged QT interval  Assessment/Plan Active Problems:   TIA (transient ischemic attack)  Dizziness /unsteady gait:  Could be due to TIA versus CVA. Patient presented with unsteady gait and dizziness for 2-3 weeks. Patient has developed left-sided weakness, left facial droop and left neglect after arrival. CT head negative, chronic right parietal lobe infarct. Exact onset of symptoms unknown since patient has unsteady gait for few weeks. Patient symptoms improved within 1 hour. CTA head and neck: Age indeterminate occlusion of right cervical ICA just beyond the origin.  Less than 50% stenosis of left ICA origin Patient was seen by telemetry neurologist Dr. Iver NestleBhagat, recommended stroke work-up. [facility]. Continue stroke protocol. Continue telemetry monitoring Patient loaded with aspirin and Plavix.   Aspirin 325 mg once followed by 81 mg daily Plavix 300 mg once followed by 75 mg daily Continue Lipitor 80 mg daily Obtain 2D echocardiogram, MRI brain. Obtain hemoglobin A1c, lipid profile, TSH, RPR N.p.o. until swallow evaluation at bedside. Neurochecks every 4-6  hours. PT and OT evaluation.  Hypertension: Hold blood pressure medications to allow permissive hypertension.  QT prolongation: EKG shows QT prolongation Monitor electrolytes and replace. Recheck EKG in the morning. Avoid medication causes QT prolongation.  DVT prophylaxis: SCDs Code Status:Full code Family Communication: Daughter at bed side. Disposition Plan:   Status is: Inpatient  Remains inpatient appropriate because:Inpatient level of care appropriate due to severity of illness   Dispo: The patient is from: Home              Anticipated d/c is to: Home              Patient currently is not medically stable to d/c.   Difficult to  place patient No    Consults called:  Neurologist Dr. Iver Nestle Admission status: Inpatient   Cipriano Bunker MD [facility]   If 7PM-7AM, please contact night-coverage [email or web address]   09/10/2020, 6:01 PM

## 2020-09-10 NOTE — ED Notes (Signed)
Pink box with pt belongings was given to family member. ([name])

## 2020-09-10 NOTE — ED Provider Notes (Signed)
[facility]-EMERGENCY DEPT Provider Note   CSN: [id number] Arrival date & time: 09/10/20  1422     History Chief Complaint  Patient presents with  . Code Stroke    [name] is a 75 y.o. female.  Dizziness the last few weeks.  Dizziness worse this morning.  Last known normal is difficult to say.  Patient arrived in triage and shortly afterwards developed left-sided weakness, left-sided facial droop, left-sided neglect.  History of blood clot but not on blood thinners.  History of hypertension.  No history of stroke.  The history is provided by the patient and a relative.  Neurologic Problem This is a new problem. The problem occurs constantly. The problem has been gradually worsening. Associated symptoms include headaches. Pertinent negatives include no chest pain, no abdominal pain and no shortness of breath. Nothing aggravates the symptoms. Nothing relieves the symptoms. She has tried nothing for the symptoms. The treatment provided no relief.       Past Medical History:  Diagnosis Date  . COPD (chronic obstructive pulmonary disease) (HCC)   . History of DVT (deep vein thrombosis)   . Hypertension   . Thoracoabdominal aortic aneurysm (HCC)     There are no problems to display for this patient.   Past Surgical History:  Procedure Laterality Date  . NO PAST SURGERIES       OB History    Gravida  9   Para      Term      Preterm      AB  1   Living  8     SAB      IAB      Ectopic      Multiple      Live Births  8           History reviewed. No pertinent family history.  Social History   Tobacco Use  . Smoking status: Former Smoker    Quit date: 05/10/1987    Years since quitting: 33.3  . Smokeless tobacco: Never Used  Vaping Use  . Vaping Use: Never used  Substance Use Topics  . Alcohol use: Never  . Drug use: Never    Home Medications Prior to Admission medications   Not on File    Allergies    Patient has  no known allergies.  Review of Systems   Review of Systems  Constitutional: Negative for chills and fever.  HENT: Negative for ear pain and sore throat.   Eyes: Negative for pain and visual disturbance.  Respiratory: Negative for cough and shortness of breath.   Cardiovascular: Negative for chest pain and palpitations.  Gastrointestinal: Negative for abdominal pain and vomiting.  Genitourinary: Negative for dysuria and hematuria.  Musculoskeletal: Negative for arthralgias and back pain.  Skin: Negative for color change and rash.  Neurological: Positive for facial asymmetry, speech difficulty, weakness and headaches. Negative for seizures and syncope.  All other systems reviewed and are negative.   Physical Exam Updated Vital Signs BP (!) 221/99   Pulse 66   Temp 98.9 F (37.2 C) (Oral)   Resp 20   Ht 5\' 10"  (1.778 m)   Wt 95.3 kg   SpO2 98%   BMI 30.13 kg/m   Physical Exam Vitals and nursing note reviewed.  Constitutional:      General: She is not in acute distress.    Appearance: She is well-developed. She is not ill-appearing.  HENT:     Head: Normocephalic and  atraumatic.     Nose: Nose normal.     Mouth/Throat:     Mouth: Mucous membranes are moist.  Eyes:     Extraocular Movements: Extraocular movements intact.     Conjunctiva/sclera: Conjunctivae normal.     Pupils: Pupils are equal, round, and reactive to light.  Cardiovascular:     Rate and Rhythm: Normal rate and regular rhythm.     Pulses: Normal pulses.     Heart sounds: Normal heart sounds. No murmur heard.   Pulmonary:     Effort: Pulmonary effort is normal. No respiratory distress.     Breath sounds: Normal breath sounds.  Abdominal:     Palpations: Abdomen is soft.     Tenderness: There is no abdominal tenderness.  Musculoskeletal:     Cervical back: Neck supple.  Skin:    General: Skin is warm and dry.     Capillary Refill: Capillary refill takes less than 2 seconds.  Neurological:      General: No focal deficit present.     Mental Status: She is alert.     Sensory: No sensory deficit.     Motor: Weakness present.     Comments: Appears to have left-sided neglect with right-sided gaze preference and does not past midline, appears to have weakness in the left upper and lower extremity, left-sided facial droop, normal sensation, overall normal speech  Psychiatric:        Mood and Affect: Mood normal.     ED Results / Procedures / Treatments   Labs (all labs ordered are listed, but only abnormal results are displayed) Labs Reviewed  CBC - Abnormal; Notable for the following components:      Result Value   Platelets 141 (*)    All other components within normal limits  RESP PANEL BY RT-PCR (FLU A&B, COVID) ARPGX2  DIFFERENTIAL  ETHANOL  PROTIME-INR  APTT  COMPREHENSIVE METABOLIC PANEL  RAPID URINE DRUG SCREEN, HOSP PERFORMED  URINALYSIS, ROUTINE W REFLEX MICROSCOPIC  I-STAT CHEM 8, ED  CBG MONITORING, ED    EKG None  Radiology CT Angio Head W or Wo Contrast  Result Date: 09/10/2020 CLINICAL DATA:  Right gaze preference and left facial droop EXAM: CT ANGIOGRAPHY HEAD AND NECK TECHNIQUE: Multidetector CT imaging of the head and neck was performed using the standard protocol during bolus administration of intravenous contrast. Multiplanar CT image reconstructions and MIPs were obtained to evaluate the vascular anatomy. Carotid stenosis measurements (when applicable) are obtained utilizing NASCET criteria, using the distal internal carotid diameter as the denominator. CONTRAST:  33mL OMNIPAQUE IOHEXOL 350 MG/ML SOLN COMPARISON:  None. FINDINGS: CTA NECK Aortic arch: Eccentric noncalcified plaque along the visualized distal aortic arch. Great vessel origins are patent. No high-grade proximal subclavian stenosis. Right carotid system: Common carotid is patent. There is mixed plaque at the bifurcation. ICA is occluded just beyond the origin. ECA is patent. Left carotid  system: Patent. Atherosclerotic wall thickening along the common carotid. Mixed but primarily calcified plaque at the bifurcation and along the proximal internal carotid. There is less than 50% stenosis. Vertebral arteries: Patent. Left vertebral artery slightly dominant. Mild plaque at the origins. Skeleton: Degenerative changes of the included spine. Other neck: No acute abnormality. Upper chest: No apical lung mass. Review of the MIP images confirms the above findings CTA HEAD Anterior circulation: At least partial reconstitution of the right ICA at the clinoid where there is calcified plaque. Right anterior and middle cerebral arteries are patent. Atherosclerotic irregularity of  the right M1 MCA with mild and moderate stenoses. Mild distal branch atherosclerotic irregularity. Left intracranial internal carotid arteries patent with calcified plaque causing mild stenosis. Left anterior and middle cerebral arteries are patent. Atherosclerotic irregularity of the left M1 MCA with mild stenosis. Mild distal branch atherosclerotic irregularity. Posterior circulation: Intracranial vertebral arteries are patent. Basilar artery is patent. Major cerebellar artery origins are patent. Posterior cerebral arteries are patent. Focal moderate stenosis of the right P2 PCA. A right posterior communicating artery is present. Venous sinuses: Patent as allowed by contrast bolus timing. 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 At least partial reconstitution at the clinoid intracranially. Less than 50% stenosis at the left ICA origin. Mild to moderate intracranial atherosclerosis. Results were discussed by telephone at the time of interpretation on 09/10/2020 at 4:34 pm with provider Dr. Iver Nestle, who verbally acknowledged these results. Electronically Signed   By: Guadlupe Spanish M.D.   On: 09/10/2020 16:37   CT Angio Neck W and/or Wo  Contrast  Result Date: 09/10/2020 CLINICAL DATA:  Right gaze preference and left facial droop EXAM: CT ANGIOGRAPHY HEAD AND NECK TECHNIQUE: Multidetector CT imaging of the head and neck was performed using the standard protocol during bolus administration of intravenous contrast. Multiplanar CT image reconstructions and MIPs were obtained to evaluate the vascular anatomy. Carotid stenosis measurements (when applicable) are obtained utilizing NASCET criteria, using the distal internal carotid diameter as the denominator. CONTRAST:  65mL OMNIPAQUE IOHEXOL 350 MG/ML SOLN COMPARISON:  None. FINDINGS: CTA NECK Aortic arch: Eccentric noncalcified plaque along the visualized distal aortic arch. Great vessel origins are patent. No high-grade proximal subclavian stenosis. Right carotid system: Common carotid is patent. There is mixed plaque at the bifurcation. ICA is occluded just beyond the origin. ECA is patent. Left carotid system: Patent. Atherosclerotic wall thickening along the common carotid. Mixed but primarily calcified plaque at the bifurcation and along the proximal internal carotid. There is less than 50% stenosis. Vertebral arteries: Patent. Left vertebral artery slightly dominant. Mild plaque at the origins. Skeleton: Degenerative changes of the included spine. Other neck: No acute abnormality. Upper chest: No apical lung mass. Review of the MIP images confirms the above findings CTA HEAD Anterior circulation: At least partial reconstitution of the right ICA at the clinoid where there is calcified plaque. Right anterior and middle cerebral arteries are patent. Atherosclerotic irregularity of the right M1 MCA with mild and moderate stenoses. Mild distal branch atherosclerotic irregularity. Left intracranial internal carotid arteries patent with calcified plaque causing mild stenosis. Left anterior and middle cerebral arteries are patent. Atherosclerotic irregularity of the left M1 MCA with mild stenosis. Mild  distal branch atherosclerotic irregularity. Posterior circulation: Intracranial vertebral arteries are patent. Basilar artery is patent. Major cerebellar artery origins are patent. Posterior cerebral arteries are patent. Focal moderate stenosis of the right P2 PCA. A right posterior communicating artery is present. Venous sinuses: Patent as allowed by contrast bolus timing. 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 At least partial reconstitution at the clinoid intracranially. Less than 50% stenosis at the left ICA origin. Mild to moderate intracranial atherosclerosis. Results were discussed by telephone at the time of interpretation on 09/10/2020 at 4:34 pm with provider Dr. Iver Nestle, who verbally acknowledged these results. Electronically Signed   By: Guadlupe Spanish M.D.   On: 09/10/2020 16:37   CT  HEAD CODE STROKE WO CONTRAST  Result Date: 09/10/2020 CLINICAL DATA:  Code stroke.  Acute neuro deficit EXAM: CT HEAD WITHOUT CONTRAST TECHNIQUE: Contiguous axial images were obtained from the base of the skull through the vertex without intravenous contrast. COMPARISON:  None. FINDINGS: Brain: Ventricle size normal. Mild atrophy. Chronic infarct right parietal lobe. Mild white matter hypodensity bilaterally most consistent with microvascular ischemia Vascular: Negative for hyperdense vessel Skull: Negative Sinuses/Orbits: Mucosal edema in the paranasal sinuses. Air-fluid levels in both maxillary sinus. Mastoid clear bilaterally. Other: None ASPECTS (Alberta Stroke Program Early CT Score) - Ganglionic level infarction (caudate, lentiform nuclei, internal capsule, insula, M1-M3 cortex): 7 - Supraganglionic infarction (M4-M6 cortex): 3 Total score (0-10 with 10 being normal): 10 IMPRESSION: 1. No acute abnormality. 2. Chronic infarct right parietal lobe. Microvascular ischemic change in the white matter 3. ASPECTS is 10 4.  These results were called by telephone at the time of interpretation on 09/10/2020 at 4:07 pm to provider Effie Shy , who verbally acknowledged these results. Electronically Signed   By: Marlan Palau M.D.   On: 09/10/2020 16:08    Procedures .Critical Care Performed by: Virgina Norfolk, DO Authorized by: Virgina Norfolk, DO   Critical care provider statement:    Critical care time (minutes):  45   Critical care was necessary to treat or prevent imminent or life-threatening deterioration of the following conditions:  CNS failure or compromise   Critical care was time spent personally by me on the following activities:  Blood draw for specimens, development of treatment plan with patient or surrogate, discussions with consultants, discussions with primary provider, evaluation of patient's response to treatment, examination of patient, obtaining history from patient or surrogate, ordering and performing treatments and interventions, ordering and review of laboratory studies, ordering and review of radiographic studies, pulse oximetry and re-evaluation of patient's condition   I assumed direction of critical care for this patient from another provider in my specialty: no     Care discussed with: admitting provider       Medications Ordered in ED Medications  clopidogrel (PLAVIX) tablet 300 mg (has no administration in time range)    Followed by  clopidogrel (PLAVIX) tablet 75 mg (has no administration in time range)  aspirin tablet 325 mg (has no administration in time range)    Followed by  aspirin EC tablet 81 mg (has no administration in time range)  atorvastatin (LIPITOR) tablet 80 mg (has no administration in time range)  iohexol (OMNIPAQUE) 350 MG/ML injection 100 mL (75 mLs Intravenous Contrast Given 09/10/20 1609)    ED Course  I have reviewed the triage vital signs and the nursing notes.  Pertinent labs & imaging results that were available during my care of the patient were reviewed by  me and considered in my medical decision making (see chart for details).    MDM Rules/Calculators/A&P                          [name] is a 75 year old female history of hypertension.  She arrives with headache, high blood pressure, dizziness.  Upon arrival patient had an acute change with left-sided weakness, right-sided preference, left-sided neglect.  Code stroke was initiated.  Overall last known normal was truly never confirmed that she has had some chronic dizziness here recently and had a dizziness episode and fall and was off balance this morning as well.  She was hypertensive upon arrival.  CT imaging showed no head bleed.  There was an age-indeterminate occlusion of the right cervical ICA just beyond the origin.  Suspect this is more subacute.  Patient with great improvement of her symptoms shortly after she arrived.  She appears to be back at her baseline now.  Overall she was not a candidate for thrombectomy and not a candidate for tPA.  Suspect may be some transient hypotension made her symptoms worse.  Neurology is recommending dual antiplatelet therapy and admission to [facility] for further stroke work-up.  Otherwise lab work unremarkable.  This chart was dictated using voice recognition software.  Despite best efforts to proofread,  errors can occur which can change the documentation meaning.    Final Clinical Impression(s) / ED Diagnoses Final diagnoses:  TIA (transient ischemic attack)    Rx / DC Orders ED Discharge Orders    None       Virgina Norfolk, DO 09/10/20 1705

## 2020-09-10 NOTE — ED Notes (Signed)
Report to Cala Bradford, Charity fundraiser at [facility] [id number].

## 2020-09-10 NOTE — ED Notes (Signed)
Attempt to call report to [name]. I was notified that the floor is no longer accepting this pt and is calling pt placement to determine where pt would be placed

## 2020-09-10 NOTE — ED Notes (Signed)
Bed placement called for bed

## 2020-09-10 NOTE — ED Notes (Signed)
Item for PT was left behind nurse station in cabinet 16-18. (pink box with name on it)

## 2020-09-10 NOTE — ED Notes (Signed)
[facility] called for pt transport  

## 2020-09-10 NOTE — ED Notes (Signed)
Pt had gold neck lace taken off while doing a CT exam. Item was placed in a pink container with her name.

## 2020-09-10 NOTE — ED Triage Notes (Signed)
Per EMS- Patient c/o headache. Patient states that she is from [place] and has not had her meds x 1 year which includes med for HTN.

## 2020-09-10 NOTE — Consult Note (Signed)
[facility] Telemedicine Consult   Requesting Provider: Virgina Mack Consult Participants: [facility] nurse, bedside nurse, myself, daughter, patient Location of the provider: [facility]  Location of the patient: [name]  This consult was provided via telemedicine with 2-way video and audio communication. The patient/family was informed that care would be provided in this way and agreed to receive care in this manner.    Chief Complaint:   HPI: [name] is a 75 y.o. 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  She recently moved from [place] so much of her care is outside of our system.  She reports she has been feeling dizzy/off balance for 2 to 3 weeks.  She has run into walls 2-3 times in the past week usually with a doorway towards the right and the wall on the left.  She has also been having some intermittent left-sided weakness that has been coming and going.  This morning she woke up particularly dizzy and off balance.  EMS was activated around 1 PM but she refused transport initially.  Family then convinced her to come to the hospital for further evaluation and therefore she presented around 3:30 PM.  Initially she was neuro intact but then while in triage was noted to have developed a right-MCA syndrome for which code stroke was activated given she had been initially grossly nonfocal on triage exam.   LKW: 1-2 weeks tpa given?: No, due to out of the window IR Thrombectomy? No, due to out of the window Modified Rankin Scale: 0-Completely asymptomatic and back to baseline post- stroke Time of teleneurologist evaluation: 4:05 PM  Exam: Vitals:   09/10/20 1622 09/10/20 1630  BP: (!) 205/89 (!) 212/98  Pulse: 76 66  Resp: 20 14  Temp:    SpO2: 97% 99%    General: Comfortable, no acute distress Pulmonary: breathing  comfortably Cardiac: regular rate and rhythm on monitor, hypertensive to the 200s  NIH Stroke scale 6 ---> 0  1A: Level of Consciousness - 0 1B: Ask Month and Age - 0 1C: 'Blink Eyes' & 'Squeeze Hands' - 0 2: Test Horizontal Extraocular Movements - 1 right gaze preference 3: Test Visual Fields - 0 4: Test Facial Palsy - 2 5A: Test Left Arm Motor Drift - 1 5B: Test Right Arm Motor Drift - 0 6A: Test Left Leg Motor Drift - 1 6B: Test Right Leg Motor Drift - 0 7: Test Limb Ataxia - 0 8: Test Sensation - 0 9: Test Language/Aphasia- 0 10: Test Dysarthria - 0 11: Test Extinction/Inattention - 1 NIHSS score: 6 however over the course of my evaluation all of these deficits fully resolved and patient and family at bedside both reported that the patient was 100% back to baseline   Imaging Reviewed:   Head CT revealed a remote right parietal infarct and chronic microvascular changes CTA revealed right carotid occlusion and scattered intracranial atherosclerosis.  Given history this is favored to be chronic   Labs reviewed in epic and pertinent values follow: Glucose 84, creatinine 0.9   Assessment: 75 year old woman with stroke risk factors as above including history of smoking, hypertension, prior MI, obesity, nonadherence to medications.  History is concerning for intermittent left-sided weakness localizing to the right MCA territory.  Suspect she has an element of acute on chronic right carotid occlusion but given that she has been having the symptoms for at least a  week, and symptoms at this time are fully resolved and patient is at baseline, risks of acute intervention outweigh any benefit.  Given that her improvement was associated with increased blood pressure from 170s to 220s, will allow for permissive hypertension at this time.  Will admit for close observation, with reactivation of code stroke if patient worsens.  Recommendations:  # Symptomatic right carotid artery occlusion  likely secondary to relative hypotension - Stroke labs HgbA1c, fasting lipid panel - MRI brain  - Frequent neuro checks - Echocardiogram - Aspirin - dose 325mg , followed by 81 mg daily - Plavix 300 mg load with 75 mg daily for 90 day course  - Atorvastatin 80 mg presumptively, may be discontinued if LDL results less than 70 - Risk factor modification - Telemetry monitoring; 30 day event monitor on discharge if no arrythmias captured  - Blood pressure goal   - Permissive hypertension to 220/120 due to concern for pressure dependent examination - Please reactivate code stroke if patient acutely declines, lay flat and fluid bolus with normal saline while awaiting neurology evaluation - Stroke team to follow in consultation  This patient is receiving care for possible acute neurological changes. There was 56 minutes of care by this provider at the time of service, including time for direct evaluation via telemedicine, review of medical records, imaging studies and discussion of findings with providers, the patient and/or family.  MD-PhD [facility] [phone] If 8pm-8am, please page neurology on call as listed in [facility]ION.

## 2020-09-11 ENCOUNTER — Inpatient Hospital Stay (HOSPITAL_COMMUNITY): Payer: Medicare Other | Admitting: Anesthesiology

## 2020-09-11 ENCOUNTER — Inpatient Hospital Stay (HOSPITAL_COMMUNITY): Payer: Medicare Other

## 2020-09-11 ENCOUNTER — Encounter (HOSPITAL_COMMUNITY): Payer: Self-pay | Admitting: Family Medicine

## 2020-09-11 ENCOUNTER — Encounter (HOSPITAL_COMMUNITY)
Admission: EM | Disposition: A | Discharge status: Rehabilitation Facility | Payer: Self-pay | Source: Home / Self Care | Attending: Internal Medicine

## 2020-09-11 ENCOUNTER — Other Ambulatory Visit (HOSPITAL_COMMUNITY): Payer: Medicare Other

## 2020-09-11 DIAGNOSIS — I6521 Occlusion and stenosis of right carotid artery: Secondary | ICD-10-CM

## 2020-09-11 DIAGNOSIS — G459 Transient cerebral ischemic attack, unspecified: Secondary | ICD-10-CM

## 2020-09-11 DIAGNOSIS — I639 Cerebral infarction, unspecified: Secondary | ICD-10-CM

## 2020-09-11 HISTORY — PX: IR PERCUTANEOUS ART THROMBECTOMY/INFUSION INTRACRANIAL INC DIAG ANGIO: IMG6087

## 2020-09-11 HISTORY — PX: IR US GUIDE VASC ACCESS RIGHT: IMG2390

## 2020-09-11 HISTORY — PX: IR CT HEAD LTD: IMG2386

## 2020-09-11 HISTORY — PX: IR INTRAVSC STENT CERV CAROTID W/O EMB-PROT MOD SED INC ANGIO: IMG2304

## 2020-09-11 HISTORY — PX: RADIOLOGY WITH ANESTHESIA: SHX6223

## 2020-09-11 LAB — HEMOGLOBIN A1C
Hgb A1c MFr Bld: 5.3 % (ref 4.8–5.6)
Mean Plasma Glucose: 105.41 mg/dL

## 2020-09-11 LAB — GLUCOSE, CAPILLARY: Glucose-Capillary: 107 mg/dL — ABNORMAL HIGH (ref 70–99)

## 2020-09-11 SURGERY — IR WITH ANESTHESIA
Anesthesia: General

## 2020-09-11 MED ORDER — EPTIFIBATIDE 20 MG/10ML IV SOLN
INTRAVENOUS | Status: AC
  Filled 2020-09-11: qty 10

## 2020-09-11 MED ORDER — TICAGRELOR 90 MG PO TABS
ORAL_TABLET | ORAL | Status: AC
  Filled 2020-09-11: qty 2

## 2020-09-11 MED ORDER — IOHEXOL 350 MG/ML SOLN
100.0000 mL | Freq: Once | INTRAVENOUS | Status: AC | PRN
  Administered 2020-09-11: 100 mL via INTRAVENOUS

## 2020-09-11 MED ORDER — VERAPAMIL HCL 2.5 MG/ML IV SOLN
INTRAVENOUS | Status: AC
  Filled 2020-09-11: qty 2

## 2020-09-11 MED ORDER — STROKE: EARLY STAGES OF RECOVERY BOOK
Freq: Once | Status: AC
  Filled 2020-09-11: qty 1

## 2020-09-11 MED ORDER — IOHEXOL 350 MG/ML SOLN: 100.0000 mL | Freq: Once | INTRAVENOUS | Status: DC | PRN

## 2020-09-11 MED ORDER — CLEVIDIPINE BUTYRATE 0.5 MG/ML IV EMUL
INTRAVENOUS | Status: AC
  Administered 2020-09-13: 3 mg/h via INTRAVENOUS
  Filled 2020-09-11: qty 50

## 2020-09-11 MED ORDER — ASPIRIN 81 MG PO CHEW
CHEWABLE_TABLET | ORAL | Status: AC
  Filled 2020-09-11: qty 1

## 2020-09-11 MED ORDER — LISINOPRIL 10 MG PO TABS
10.0000 mg | ORAL_TABLET | Freq: Every day | ORAL | Status: DC
  Administered 2020-09-11 – 2020-09-13 (×2): 10 mg via ORAL
  Filled 2020-09-11 (×3): qty 1

## 2020-09-11 MED ORDER — SODIUM CHLORIDE 0.9 % IV SOLN
2.0000 ug/kg/min | Freq: Once | INTRAVENOUS | Status: AC
  Administered 2020-09-11: 2 ug/kg/min via INTRAVENOUS

## 2020-09-11 MED ORDER — ROCURONIUM BROMIDE 100 MG/10ML IV SOLN
INTRAVENOUS | Status: DC | PRN
  Administered 2020-09-11: 10 mg via INTRAVENOUS
  Administered 2020-09-11: 50 mg via INTRAVENOUS
  Administered 2020-09-11: 10 mg via INTRAVENOUS

## 2020-09-11 MED ORDER — SODIUM CHLORIDE 0.9 % IV SOLN: INTRAVENOUS | Status: DC

## 2020-09-11 MED ORDER — ONDANSETRON HCL 4 MG/2ML IJ SOLN
INTRAMUSCULAR | Status: DC | PRN
  Administered 2020-09-11: 4 mg via INTRAVENOUS

## 2020-09-11 MED ORDER — LACTATED RINGERS IV SOLN: INTRAVENOUS | Status: DC | PRN

## 2020-09-11 MED ORDER — PHENYLEPHRINE HCL-NACL 10-0.9 MG/250ML-% IV SOLN
INTRAVENOUS | Status: DC | PRN
  Administered 2020-09-11: 25 ug/min via INTRAVENOUS

## 2020-09-11 MED ORDER — DEXAMETHASONE SODIUM PHOSPHATE 4 MG/ML IJ SOLN
INTRAMUSCULAR | Status: DC | PRN
  Administered 2020-09-11: 4 mg via INTRAVENOUS

## 2020-09-11 MED ORDER — CLOPIDOGREL BISULFATE 300 MG PO TABS
ORAL_TABLET | ORAL | Status: AC
  Filled 2020-09-11: qty 1

## 2020-09-11 MED ORDER — SUCCINYLCHOLINE CHLORIDE 20 MG/ML IJ SOLN
INTRAMUSCULAR | Status: DC | PRN
  Administered 2020-09-11: 80 mg via INTRAVENOUS

## 2020-09-11 MED ORDER — DIPHENHYDRAMINE HCL 25 MG PO CAPS
25.0000 mg | ORAL_CAPSULE | Freq: Four times a day (QID) | ORAL | Status: DC | PRN
  Administered 2020-09-11: 25 mg via ORAL
  Filled 2020-09-11: qty 1

## 2020-09-11 MED ORDER — CANGRELOR TETRASODIUM 50 MG IV SOLR
INTRAVENOUS | Status: AC
  Filled 2020-09-11: qty 50

## 2020-09-11 MED ORDER — PROPOFOL 10 MG/ML IV BOLUS
INTRAVENOUS | Status: DC | PRN
  Administered 2020-09-11: 130 mg via INTRAVENOUS

## 2020-09-11 MED ORDER — TIROFIBAN HCL IN NACL 5-0.9 MG/100ML-% IV SOLN
INTRAVENOUS | Status: AC
  Filled 2020-09-11: qty 100

## 2020-09-11 MED ORDER — FENTANYL CITRATE (PF) 100 MCG/2ML IJ SOLN
INTRAMUSCULAR | Status: AC
  Filled 2020-09-11: qty 2

## 2020-09-11 MED ORDER — GLYCOPYRROLATE 0.2 MG/ML IJ SOLN
INTRAMUSCULAR | Status: DC | PRN
  Administered 2020-09-11 (×2): .2 mg via INTRAVENOUS

## 2020-09-11 MED ORDER — SUGAMMADEX SODIUM 200 MG/2ML IV SOLN
INTRAVENOUS | Status: DC | PRN
  Administered 2020-09-11: 100 mg via INTRAVENOUS
  Administered 2020-09-11: 200 mg via INTRAVENOUS

## 2020-09-11 MED ORDER — LIDOCAINE HCL (CARDIAC) PF 100 MG/5ML IV SOSY
PREFILLED_SYRINGE | INTRAVENOUS | Status: DC | PRN
  Administered 2020-09-11: 60 mg via INTRATRACHEAL

## 2020-09-11 MED ORDER — CLEVIDIPINE BUTYRATE 0.5 MG/ML IV EMUL
INTRAVENOUS | Status: DC | PRN
  Administered 2020-09-11: 2 mg/h via INTRAVENOUS

## 2020-09-11 MED ORDER — HYDRALAZINE HCL 20 MG/ML IJ SOLN
5.0000 mg | INTRAMUSCULAR | Status: DC | PRN
  Administered 2020-09-11: 10 mg via INTRAVENOUS
  Filled 2020-09-11: qty 1

## 2020-09-11 MED ORDER — CANGRELOR BOLUS VIA INFUSION
30.0000 ug/kg | Freq: Once | INTRAVENOUS | Status: AC
  Administered 2020-09-11: 2859 ug via INTRAVENOUS

## 2020-09-11 MED ORDER — FENTANYL CITRATE (PF) 100 MCG/2ML IJ SOLN
INTRAMUSCULAR | Status: DC | PRN
  Administered 2020-09-11: 100 ug via INTRAVENOUS

## 2020-09-11 MED ORDER — IOHEXOL 240 MG/ML SOLN
INTRAMUSCULAR | Status: AC
  Administered 2020-09-11: 80 mL
  Filled 2020-09-11: qty 200

## 2020-09-11 NOTE — Anesthesia Procedure Notes (Signed)
Procedure Name: Intubation Date/Time: 09/11/2020 8:54 PM Performed by: Flowers, Rokoshi T, CRNA Pre-anesthesia Checklist: Patient identified, Emergency Drugs available, Suction available and Patient being monitored Patient Re-evaluated:Patient Re-evaluated prior to induction Oxygen Delivery Method: Circle System Utilized Preoxygenation: Pre-oxygenation with 100% oxygen Induction Type: IV induction Ventilation: Mask ventilation without difficulty Laryngoscope Size: Miller and 2 Grade View: Grade I Tube type: Oral Tube size: 7.5 mm Number of attempts: 1 Airway Equipment and Method: Stylet and Oral airway Placement Confirmation: ETT inserted through vocal cords under direct vision,  positive ETCO2 and breath sounds checked- equal and bilateral Secured at: 22 cm Tube secured with: Tape Dental Injury: Teeth and Oropharynx as per pre-operative assessment

## 2020-09-11 NOTE — Progress Notes (Signed)
PROGRESS NOTE    [name]  QMV:[id number] DOB: 18-Feb-1946 DOA: 09/10/2020 PCP: Patient, No Pcp Per (Inactive)    Brief Narrative:  75 y.o. female with medical history significant of HTN, not on any antihypertensive medications.  Patient reports she is from [place] and last PCP visit was more than a year ago and she is without medication for that duration.  She complains of having dizziness and unsteady gait for 2 weeks which has been worse in the morning today which brought her to the emergency room.  After she arrived in the ED she has developed left-sided weakness, left-sided facial droop and left neglect.  Stroke code was activated. Exact time of onset of symptoms unknown since patient has been having unsteady gait for few days, Patient also reports intermittent headache because of uncontrolled hypertension.  Patient symptoms has resolved within an hour.  Patient was seen by tele neurologist Dr. Iver Nestle, recommended  stroke work-up, dual antiplatelet agents , aspirin and Plavix, statins.  Patient denies any recent travel, sick contacts, cough, weight loss, chest pain, shortness of breath.   ED Course: She was hemodynamically stable except hypertension.   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 At least partial reconstitution at the clinoid intracranially. Less than 50% stenosis at the left ICA origin.  Assessment & Plan:   Active Problems:   TIA (transient ischemic attack)  Dizziness /unsteady gait secondary to  acute and subacute R parietal infarct:  -MRI brain reviewed. Findings of R parietal small to moderate size acute and subacute infarct with few additional punctate acute to subacute R centrum semiovale infarcts noted -Neurology consulted and is following -Symptoms have since markedly improved, nearly resolved -CTA head and neck: Age indeterminate occlusion of right cervical ICA just beyond the origin.  Less than 50% stenosis of left ICA origin -Pt was given Aspirin 325 mg once followed by 81 mg daily and Plavix 300 mg once followed by 75 mg daily -Continue Lipitor 80 mg daily -2d echo ordered, pending -Hemoglobin A1c is pending -Will order lipid profile -Passed swallow eval -PT/OT with recs for outpatient PT/OT  Hypertension: -No bp meds per home med rec -Would begin slowly normalizing BP. Pt is bradycardic, thus would avoid BB -Will start low dose lisinopril  QT prolongation: -Initial EKG with QTc of 550 -Repeat EKG with qtc of 487 -Continue to replace lytes as needed  DVT prophylaxis: SCD's Code Status: Full Family Communication: Pt in room, family not at bedside  Status is: Inpatient  Remains inpatient appropriate because:Inpatient level of care appropriate due to severity of illness   Dispo: The patient is from: Home              Anticipated d/c is to: Home              Patient currently is not medically stable to d/c.   Difficult to place patient No       Consultants:   Neurology  Procedures:     Antimicrobials: Anti-infectives (From admission, onward)   None  Subjective: Eager to go home  Objective: Vitals:   09/11/20 0830 09/11/20 0930 09/11/20 1131 09/11/20 1600  BP: (!) 197/72 (!) 197/57 (!) 148/55 (!) 175/76  Pulse: (!) 58 (!) 44 63 (!) 51  Resp: 16 18 17 16   Temp: 98 F (36.7 C)  98.2 F (36.8 C) 98.1 F (36.7 C)  TempSrc: Oral  Oral Oral  SpO2: 99% 98% 97% 98%  Weight:      Height:        Intake/Output Summary (Last 24  hours) at 09/11/2020 1720 Last data filed at 09/11/2020 1548 Gross per 24 hour  Intake 720 ml  Output 350 ml  Net 370 ml   Filed Weights   09/10/20 1454  Weight: 95.3 kg    Examination: General exam: Awake, laying in bed, in nad Respiratory system: Normal respiratory effort, no wheezing Cardiovascular system: regular rate, s1, s2 Gastrointestinal system: Soft, nondistended, positive BS Central nervous system: CN2-12 grossly intact, strength intact Extremities: Perfused, no clubbing Skin: Normal skin turgor, no notable skin lesions seen Psychiatry: Mood normal // no visual hallucinations   Data Reviewed: I have personally reviewed following labs and imaging studies  CBC: Recent Labs  Lab 09/10/20 1540 09/10/20 1559  WBC 4.4  --   NEUTROABS 1.9  --   HGB 12.2 12.6  HCT 38.3 37.0  MCV 90.5  --   PLT 141*  --    Basic Metabolic Panel: Recent Labs  Lab 09/10/20 1540 09/10/20 1559  NA 141 143  K 3.3* 3.7  CL 106 105  CO2 26  --   GLUCOSE 88 84  BUN 18 20  CREATININE 0.71 0.90  CALCIUM 8.7*  --    GFR: Estimated Creatinine Clearance: 66.5 mL/min (by C-G formula based on SCr of 0.9 mg/dL). Liver Function Tests: Recent Labs  Lab 09/10/20 1540  AST 17  ALT 8  ALKPHOS 67  BILITOT 0.4  PROT 7.1  ALBUMIN 3.6   No results for input(s): LIPASE, AMYLASE in the last 168 hours. No results for input(s): AMMONIA in the last 168 hours. Coagulation Profile: Recent Labs  Lab 09/10/20 1540  INR 1.0   Cardiac Enzymes: No results for input(s): CKTOTAL, CKMB, CKMBINDEX, TROPONINI in the last 168 hours. BNP (last 3 results) No results for input(s): PROBNP in the last 8760 hours. HbA1C: No results for input(s): HGBA1C in the last 72 hours. CBG: Recent Labs  Lab 09/10/20 1544  GLUCAP 87   Lipid Profile: No results for input(s): CHOL, HDL, LDLCALC, TRIG, CHOLHDL, LDLDIRECT in the last 72 hours. Thyroid Function Tests: No results for input(s): TSH, T4TOTAL, FREET4,  T3FREE, THYROIDAB in the last 72 hours. Anemia Panel: No results for input(s): VITAMINB12, FOLATE, FERRITIN, TIBC, IRON, RETICCTPCT in the last 72 hours. Sepsis Labs: No results for input(s): PROCALCITON, LATICACIDVEN in the last 168 hours.  Recent Results (from the past 240 hour(s))  Resp Panel by RT-PCR (Flu A&B, Covid) Nasopharyngeal Swab     Status: None   Collection Time: 09/10/20  4:27 PM   Specimen: Nasopharyngeal Swab; Nasopharyngeal(NP) swabs in vial transport medium  Result Value Ref Range Status   SARS Coronavirus 2 by RT PCR NEGATIVE NEGATIVE Final    Comment: (NOTE) SARS-CoV-2 target nucleic acids are NOT DETECTED.  The SARS-CoV-2 RNA is generally detectable in upper respiratory specimens during the acute phase of infection. The lowest concentration of SARS-CoV-2 viral copies this assay can detect is 138 copies/mL. A negative result does not preclude SARS-Cov-2 infection and  should not be used as the sole basis for treatment or other patient management decisions. A negative result may occur with  improper specimen collection/handling, submission of specimen other than nasopharyngeal swab, presence of viral mutation(s) within the areas targeted by this assay, and inadequate number of viral copies(<138 copies/mL). A negative result must be combined with clinical observations, patient history, and epidemiological information. The expected result is Negative.  Fact Sheet for Patients:  BloggerCourse.com  Fact Sheet for Healthcare Providers:  SeriousBroker.it  This test is no t yet approved or cleared by the [place] FDA and  has been authorized for detection and/or diagnosis of SARS-CoV-2 by FDA under an Emergency Use Authorization (EUA). This EUA will remain  in effect (meaning this test can be used) for the duration of the COVID-19 declaration under Section 564(b)(1) of the Act, 21 U.S.C.section 360bbb-3(b)(1),  unless the authorization is terminated  or revoked sooner.       Influenza A by PCR NEGATIVE NEGATIVE Final   Influenza B by PCR NEGATIVE NEGATIVE Final    Comment: (NOTE) The Xpert Xpress SARS-CoV-2/FLU/RSV plus assay is intended as an aid in the diagnosis of influenza from Nasopharyngeal swab specimens and should not be used as a sole basis for treatment. Nasal washings and aspirates are unacceptable for Xpert Xpress SARS-CoV-2/FLU/RSV testing.  Fact Sheet for Patients: BloggerCourse.com  Fact Sheet for Healthcare Providers: SeriousBroker.it  This test is not yet approved or cleared by the [place] FDA and has been authorized for detection and/or diagnosis of SARS-CoV-2 by FDA under an Emergency Use Authorization (EUA). This EUA will remain in effect (meaning this test can be used) for the duration of the COVID-19 declaration under Section 564(b)(1) of the Act, 21 U.S.C. section 360bbb-3(b)(1), unless the authorization is terminated or revoked.  Performed at [facility], [address] [place], [place] [address]      Radiology Studies: CT Angio Head W or Wo Contrast  Result Date: 09/10/2020 CLINICAL DATA:  Right gaze preference and left facial droop EXAM: CT ANGIOGRAPHY HEAD AND NECK TECHNIQUE: Multidetector CT imaging of the head and neck was performed using the standard protocol during bolus administration of intravenous contrast. Multiplanar CT image reconstructions and MIPs were obtained to evaluate the vascular anatomy. Carotid stenosis measurements (when applicable) are obtained utilizing NASCET criteria, using the distal internal carotid diameter as the denominator. CONTRAST:  44mL OMNIPAQUE IOHEXOL 350 MG/ML SOLN COMPARISON:  None. FINDINGS: CTA NECK Aortic arch: Eccentric noncalcified plaque along the visualized distal aortic arch. Great vessel origins are patent. No high-grade proximal subclavian  stenosis. Right carotid system: Common carotid is patent. There is mixed plaque at the bifurcation. ICA is occluded just beyond the origin. ECA is patent. Left carotid system: Patent. Atherosclerotic wall thickening along the common carotid. Mixed but primarily calcified plaque at the bifurcation and along the proximal internal carotid. There is less than 50% stenosis. Vertebral arteries: Patent. Left vertebral artery slightly dominant. Mild plaque at the origins. Skeleton: Degenerative changes of the included spine. Other neck: No acute abnormality. Upper chest: No apical lung mass. Review of the MIP images confirms the above findings CTA HEAD Anterior circulation: At least partial reconstitution of the right ICA at the clinoid where there is calcified plaque. Right anterior and middle cerebral arteries are patent. Atherosclerotic irregularity of the right M1 MCA with mild and moderate stenoses. Mild distal branch atherosclerotic irregularity. Left intracranial internal carotid arteries patent with calcified plaque causing mild stenosis. Left anterior and middle  cerebral arteries are patent. Atherosclerotic irregularity of the left M1 MCA with mild stenosis. Mild distal branch atherosclerotic irregularity. Posterior circulation: Intracranial vertebral arteries are patent. Basilar artery is patent. Major cerebellar artery origins are patent. Posterior cerebral arteries are patent. Focal moderate stenosis of the right P2 PCA. A right posterior communicating artery is present. Venous sinuses: Patent as allowed by contrast bolus timing. 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 At least partial reconstitution at the clinoid intracranially. Less than 50% stenosis at the left ICA origin. Mild to moderate intracranial atherosclerosis. Results were discussed by telephone at the time of interpretation on 09/10/2020 at 4:34 pm  with provider Dr. Iver NestleBhagat, who verbally acknowledged these results. Electronically Signed   By: Guadlupe SpanishPraneil  Patel M.D.   On: 09/10/2020 16:37   CT Angio Neck W and/or Wo Contrast  Result Date: 09/10/2020 CLINICAL DATA:  Right gaze preference and left facial droop EXAM: CT ANGIOGRAPHY HEAD AND NECK TECHNIQUE: Multidetector CT imaging of the head and neck was performed using the standard protocol during bolus administration of intravenous contrast. Multiplanar CT image reconstructions and MIPs were obtained to evaluate the vascular anatomy. Carotid stenosis measurements (when applicable) are obtained utilizing NASCET criteria, using the distal internal carotid diameter as the denominator. CONTRAST:  75mL OMNIPAQUE IOHEXOL 350 MG/ML SOLN COMPARISON:  None. FINDINGS: CTA NECK Aortic arch: Eccentric noncalcified plaque along the visualized distal aortic arch. Great vessel origins are patent. No high-grade proximal subclavian stenosis. Right carotid system: Common carotid is patent. There is mixed plaque at the bifurcation. ICA is occluded just beyond the origin. ECA is patent. Left carotid system: Patent. Atherosclerotic wall thickening along the common carotid. Mixed but primarily calcified plaque at the bifurcation and along the proximal internal carotid. There is less than 50% stenosis. Vertebral arteries: Patent. Left vertebral artery slightly dominant. Mild plaque at the origins. Skeleton: Degenerative changes of the included spine. Other neck: No acute abnormality. Upper chest: No apical lung mass. Review of the MIP images confirms the above findings CTA HEAD Anterior circulation: At least partial reconstitution of the right ICA at the clinoid where there is calcified plaque. Right anterior and middle cerebral arteries are patent. Atherosclerotic irregularity of the right M1 MCA with mild and moderate stenoses. Mild distal branch atherosclerotic irregularity. Left intracranial internal carotid arteries patent with  calcified plaque causing mild stenosis. Left anterior and middle cerebral arteries are patent. Atherosclerotic irregularity of the left M1 MCA with mild stenosis. Mild distal branch atherosclerotic irregularity. Posterior circulation: Intracranial vertebral arteries are patent. Basilar artery is patent. Major cerebellar artery origins are patent. Posterior cerebral arteries are patent. Focal moderate stenosis of the right P2 PCA. A right posterior communicating artery is present. Venous sinuses: Patent as allowed by contrast bolus timing. 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 At least partial reconstitution at the clinoid intracranially. Less than 50% stenosis at the left ICA origin. Mild to moderate intracranial atherosclerosis. Results were discussed by telephone at the time of interpretation on 09/10/2020 at 4:34 pm with provider Dr. Iver NestleBhagat, who verbally acknowledged these results. Electronically Signed   By: Guadlupe SpanishPraneil  Patel M.D.   On: 09/10/2020 16:37   MR BRAIN WO CONTRAST  Result Date: 09/10/2020 CLINICAL DATA:  ICA occlusion, code stroke follow-up EXAM: MRI HEAD WITHOUT CONTRAST TECHNIQUE: Multiplanar, multiecho pulse sequences of the brain and surrounding  structures were obtained without intravenous contrast. COMPARISON:  Prior CT FINDINGS: Brain: There is diffusion hyperintensity in the right parietal lobe corresponding to infarct on CT. There is mixed ADC including hypointensity. Few additional punctate foci of mildly reduced diffusion in the right centrum semiovale. There is no evidence of intracranial hemorrhage. There is no intracranial mass or significant mass effect. There is no hydrocephalus or extra-axial fluid collection. Prominence of the ventricles and sulci reflects generalized parenchymal volume loss. Other patchy foci of T2 hyperintensity in the supratentorial white matter are nonspecific  but may reflect mild chronic microvascular ischemic changes. Vascular: Loss of the flow void of the visualized cervical right ICA and proximal intracranial portion corresponding to findings on CTA. Skull and upper cervical spine: Normal marrow signal is preserved. Sinuses/Orbits: Paranasal sinus mucosal thickening with maxillary sinus air-fluid levels. Orbits are unremarkable. Other: Sella is unremarkable.  Mastoid air cells are clear. 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 No evidence of hemorrhage. Electronically Signed   By: Guadlupe Spanish M.D.   On: 09/10/2020 19:08   CT HEAD CODE STROKE WO CONTRAST  Result Date: 09/10/2020 CLINICAL DATA:  Code stroke.  Acute neuro deficit EXAM: CT HEAD WITHOUT CONTRAST TECHNIQUE: Contiguous axial images were obtained from the base of the skull through the vertex without intravenous contrast. COMPARISON:  None. FINDINGS: Brain: Ventricle size normal. Mild atrophy. Chronic infarct right parietal lobe. Mild white matter hypodensity bilaterally most consistent with microvascular ischemia Vascular: Negative for hyperdense vessel Skull: Negative Sinuses/Orbits: Mucosal edema in the paranasal sinuses. Air-fluid levels in both maxillary sinus. Mastoid clear bilaterally. Other: None ASPECTS (Alberta Stroke Program Early CT Score) - Ganglionic level infarction (caudate, lentiform nuclei, internal capsule, insula, M1-M3 cortex): 7 - Supraganglionic infarction (M4-M6 cortex): 3 Total score (0-10 with 10 being normal): 10 IMPRESSION: 1. No acute abnormality. 2. Chronic infarct right parietal lobe. Microvascular ischemic change in the white matter 3. ASPECTS is 10 4. These results were called by telephone at the time of interpretation on 09/10/2020 at 4:07 pm to provider Effie Shy , who verbally acknowledged these results. Electronically Signed   By: Marlan Palau M.D.    On: 09/10/2020 16:08    Scheduled Meds: . aspirin EC  81 mg Oral Daily  . atorvastatin  80 mg Oral QHS  . clopidogrel  75 mg Oral Daily   Continuous Infusions: . sodium chloride Stopped (09/11/20 0900)     LOS: 1 day   Rickey Barbara, MD [facility] Pager On Amion  If 7PM-7AM, please contact night-coverage 09/11/2020, 5:20 PM

## 2020-09-11 NOTE — Code Documentation (Addendum)
Called originally at 1925 d/t new onset L sided weakness/facial droop and dysarthria. [id number](confirmed with day shift RN via phone). CBG-107, NIH-15 for L facial droop, dysarthria, R sided gaze preference L sided weakness, L sided visual and sensory neglect. Code Stroke initiated PTA RRT. Page came out at 9. Pt txed to CT-no acute changes, CTA/CTP-R MCA territory ischemia with 162cc penumbra, R ICA occlusion, progressive prox R M1 segment stenosis. Pt not a candidate for TPA d/t prior stroke. IR paged out at 2034 and pt txed to IR suite at 2036. Plan to admit to ICU.

## 2020-09-11 NOTE — Progress Notes (Addendum)
Neurology Progress Note   S:// Code stroke called for worsening left-sided weakness and left-sided neglect. Patient earlier evaluated by telemedicine neurology and then again by the inpatient neurology service this morning with an NIH stroke scale of 1 for dysarthria. Last known normal-technically she was never NIH of 0 so last known normal was 2 to 3 weeks ago. She was at her baseline of NIH of 1 with last known well of that time of 7 pm and later on in the shift was noted to have left-sided weakness in the arm leg and face as well as dysarthria and left-sided neglect for which a code stroke was activated. Her work-up has revealed right ICA occlusion just beyond the origin and at least partial reconstitution at the clinoid intracranially and less than 50% stenosis of the left ICA origin. MRI examination was done yesterday and revealed right parietal small to moderate-sized acute and subacute infarct as well as a few additional punctate acute to subacute right centrum semiovale infarct.  No evidence of hemorrhage.  mRS 0 LKW: see above Not a candidate for tPA due to recent stroke seen on MRI.   O:// Current vital signs: BP (!) 202/80 (BP Location: Right Arm)   Pulse (!) 51   Temp 98.1 F (36.7 C) (Oral)   Resp 16   Ht 5\' 10"  (1.778 m)   Wt 95.3 kg   SpO2 98%   BMI 30.13 kg/m  Vital signs in last 24 hours: Temp:  [98 F (36.7 C)-98.5 F (36.9 C)] 98.1 F (36.7 C) (05/06 1600) Pulse Rate:  [43-63] 51 (05/06 1600) Resp:  [13-20] 16 (05/06 1600) BP: (148-226)/(45-100) 202/80 (05/06 1930) SpO2:  [97 %-100 %] 98 % (05/06 1600) General: Awake alert in no distress HEENT: Normocephalic/atraumatic Lungs: Clear to auscultation EXTR cardiovascular: Regular rhythm Abdomen soft nondistended nontender Extremities warm well perfused Neurological exam Mental status: Awake alert oriented x3 Mild dysarthria No aphasia Cranial nerve examination: She has a right gaze preference but is able  to look all the way to the left, pupils are equal round react light, there is no visual field cut, left lower face is asymmetric-mild left nasolabial fold flattening and drooping of the left angle of the mouth while smiling, auditory acuity intact, tongue and palate midline. Motor exam: Left upper extremity is 4 -/5 left lower extremity is barely 3/5.  Right upper and lower extremity are full strength 5/5. Sensation intact light touch but she extinguishes sensory input from left side on double simultaneous stimulation Coordination: Dysmetric left but not disproportionate to the weakness. 1a Level of Conscious.: 0 1b LOC Questions: 0 1c LOC Commands: 0 2 Best Gaze: 1 3 Visual: 0 4 Facial Palsy: 2 5a Motor Arm - left: 0 5b Motor Arm - Right: 1 6a Motor Leg - Left: 0 6b Motor Leg - Right: 2 7 Limb Ataxia: 0 8 Sensory: 0 9 Best Language: 0 10 Dysarthria: 1 11 Extinct. and Inatten.: 1 TOTAL: 8  Medications  Current Facility-Administered Medications:  .  0.9 %  sodium chloride infusion, , Intravenous, Continuous, 02-[birth year]1983, MD, Stopped at 09/11/20 0900 .  0.9 %  sodium chloride infusion, , Intravenous, Continuous, 11/11/20, MD .  acetaminophen (TYLENOL) tablet 650 mg, 650 mg, Oral, Q4H PRN **OR** acetaminophen (TYLENOL) 160 MG/5ML solution 650 mg, 650 mg, Per Tube, Q4H PRN **OR** acetaminophen (TYLENOL) suppository 650 mg, 650 mg, Rectal, Q4H PRN, Milon Dikes, MD .  [COMPLETED] aspirin tablet 325 mg, 325 mg, Oral, Once, 325 mg  at 09/10/20 1759 **FOLLOWED BY** aspirin EC tablet 81 mg, 81 mg, Oral, Daily, Cipriano Bunker, MD, 81 mg at 09/11/20 0830 .  atorvastatin (LIPITOR) tablet 80 mg, 80 mg, Oral, QHS, Cipriano Bunker, MD, 80 mg at 09/10/20 2221 .  [COMPLETED] clopidogrel (PLAVIX) tablet 300 mg, 300 mg, Oral, Once, 300 mg at 09/10/20 1759 **FOLLOWED BY** clopidogrel (PLAVIX) tablet 75 mg, 75 mg, Oral, Daily, Cipriano Bunker, MD, 75 mg at 09/11/20 0830 .  diphenhydrAMINE (BENADRYL)  capsule 25 mg, 25 mg, Oral, Q6H PRN, Jerald Kief, MD, 25 mg at 09/11/20 1720 .  hydrALAZINE (APRESOLINE) injection 5-10 mg, 5-10 mg, Intravenous, Q4H PRN, Hillary Bow, DO, 10 mg at 09/11/20 0131 .  lisinopril (ZESTRIL) tablet 10 mg, 10 mg, Oral, Daily, Jerald Kief, MD, 10 mg at 09/11/20 1807 .  senna-docusate (Senokot-S) tablet 1 tablet, 1 tablet, Oral, QHS PRN, Cipriano Bunker, MD  Labs CBC    Component Value Date/Time   WBC 4.4 09/10/2020 1540   RBC 4.23 09/10/2020 1540   HGB 12.6 09/10/2020 1559   HCT 37.0 09/10/2020 1559   PLT 141 (L) 09/10/2020 1540   MCV 90.5 09/10/2020 1540   MCH 28.8 09/10/2020 1540   MCHC 31.9 09/10/2020 1540   RDW 13.1 09/10/2020 1540   LYMPHSABS 1.6 09/10/2020 1540   MONOABS 0.5 09/10/2020 1540   EOSABS 0.3 09/10/2020 1540   BASOSABS 0.0 09/10/2020 1540    CMP     Component Value Date/Time   NA 143 09/10/2020 1559   K 3.7 09/10/2020 1559   CL 105 09/10/2020 1559   CO2 26 09/10/2020 1540   GLUCOSE 84 09/10/2020 1559   BUN 20 09/10/2020 1559   CREATININE 0.90 09/10/2020 1559   CALCIUM 8.7 (L) 09/10/2020 1540   PROT 7.1 09/10/2020 1540   ALBUMIN 3.6 09/10/2020 1540   AST 17 09/10/2020 1540   ALT 8 09/10/2020 1540   ALKPHOS 67 09/10/2020 1540   BILITOT 0.4 09/10/2020 1540   GFRNONAA >60 09/10/2020 1540   GFRAA >60 11/17/2010 0415    Imaging I have reviewed images in epic and the results pertinent to this consultation are: CT head noncontrast code stroke: Stable appearance of right parietal acute/subacute infarct.  No new infarct.  Aspects 10. CT angio head and neck, CT perfusion study: Right MCA territory ischemia with 162 cc of surrounding penumbra.  Occlusion of the right internal carotid artery at the bifurcation with reconstitution below the ophthalmic segment.  Progressive proximal right M1 segment stenosis with significant loss of perfusion in the right MCA branch vessels which is new from the last CTA from  yesterday.   Assessment:  75 year old woman presenting with 2 to 3 weeks of off-and-on left-sided arm leg and face weakness with slurred speech, known to have a subacute right parietal infarct on the MRI yesterday with near complete resolution of symptoms with NIH of 1 residual, with sudden worsening of neurological status to an NIH of 14 from rapid response evaluation with somewhat improved to an NIH of 8 on my evaluation. It was noted that she has a right carotid occlusion yesterday but her right MCA branches were fully opacified.  Today CTA confirmed the right carotid occlusion but showed progressive proximal right M1 segment stenosis with significant loss of perfusion in the right MCA branch vessels and a CT perfusion study showing 0 core and 162 cc of surrounding penumbra. Likely atheroembolic versus large vessel occlusion causing the symptoms to worsen- Case discussed in detail with radiology,  interventional radiology and then with the family-daughter Dalbert Batman. Explained that there are 2 options at this time- medically manage her well permissive hypertension and it may or may not lead to resolution of symptoms and might leave her with a large right MCA hemispheric infarct or proceed with endovascular intervention in an attempt to revascularize the right hemisphere-right MCA and may be right ICA as well. Discussed clearly that the procedure being offered is not usual for emergent stroke treatments because of her complex picture and ongoing symptoms for weeks and known stroke on the MRI but the large area at risk and sudden clinical deterioration might benefit from endovascular intervention. Also explained to the family that the risk of inability to revascularize is high and also there is of hemorrhagic conversion is high due to the recent stroke. They talked amongst themselves and finally agreed to proceed with the procedure. There was a significant amount of delay in family decision making prior  to IR code initiation. Had spoken to the neuro interventionalists about nearly 30 minutes before initiating the IR code stroke to give family the time to discuss.  Impression:  Right internal carotid artery occlusion  Right MCA stroke  Not a candidate for tPA-due to recent stroke seen on MRI already.  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  Recommendations: Emergent diagnostic cerebral angiogram and right hemispheric revascularization per neuro interventional radiology Transferred to ICU after the procedure Post stroke care per post thrombectomy protocol Stroke work-up is already been ordered Stroke neurology will continue to follow  Discussed with the daughter does respond. Discussed with Dr. Gaye Alken. Discussed with neuroradiology  -- Milon Dikes, MD Neurologist [facility] Pager: [phone]  CRITICAL CARE ATTESTATION Performed by: Milon Dikes, MD Total critical care time: 80 minutes Critical care time was exclusive of separately billable procedures and treating other patients and/or supervising APPs/Residents/Students Critical care was necessary to treat or prevent imminent or life-threatening deterioration due to acute ischemic stroke, right internal carotid artery occlusion, right MCA hypoperfusion This patient is critically ill and at significant risk for neurological worsening and/or death and care requires constant monitoring. Critical care was time spent personally by me on the following activities: development of treatment plan with patient and/or surrogate as well as nursing, discussions with consultants, evaluation of patient's response to treatment, examination of patient, obtaining history from patient or surrogate, ordering and performing treatments and interventions, ordering and review of laboratory studies, ordering and review of radiographic  studies, pulse oximetry, re-evaluation of patient's condition, participation in multidisciplinary rounds and medical decision making of high complexity in the care of this patient.

## 2020-09-11 NOTE — Anesthesia Preprocedure Evaluation (Signed)
Anesthesia Evaluation  Patient identified by MRN, date of birth, ID band Patient unresponsive    Reviewed: Allergy & Precautions, Unable to perform ROS - Chart review onlyPreop documentation limited or incomplete due to emergent nature of procedure.  History of Anesthesia Complications Negative for: history of anesthetic complications  Airway        Dental   Pulmonary COPD, former smoker,  Covid-19 Nucleic Acid Test Results Lab Results      Component                Value               Date                      SARSCOV2NAA              NEGATIVE            09/10/2020              breath sounds clear to auscultation       Cardiovascular hypertension, Pt. on medications  Rhythm:Regular     Neuro/Psych TIACVA, Residual Symptoms negative psych ROS   GI/Hepatic negative GI ROS, Lab Results      Component                Value               Date                      ALT                      8                   09/10/2020                AST                      17                  09/10/2020                ALKPHOS                  67                  09/10/2020                BILITOT                  0.4                 09/10/2020              Endo/Other    Renal/GU Lab Results      Component                Value               Date                      CREATININE               0.90                09/10/2020           Lab Results      Component  Value               Date                      K                        3.7                 09/10/2020                Musculoskeletal negative musculoskeletal ROS (+)   Abdominal   Peds  Hematology Lab Results      Component                Value               Date                      WBC                      4.4                 09/10/2020                HGB                      12.6                09/10/2020                HCT                      37.0                 09/10/2020                MCV                      90.5                09/10/2020                PLT                      141 (L)             09/10/2020             plavix   Anesthesia Other Findings   Reproductive/Obstetrics                             Anesthesia Physical Anesthesia Plan  ASA: III and emergent  Anesthesia Plan: General   Post-op Pain Management:    Induction: Intravenous, Rapid sequence and Cricoid pressure planned  PONV Risk Score and Plan: 3 and Ondansetron and Dexamethasone  Airway Management Planned: Oral ETT  Additional Equipment: Arterial line  Intra-op Plan:   Post-operative Plan: Possible Post-op intubation/ventilation  Informed Consent:     History available from chart only and Only emergency history available  Plan Discussed with: CRNA  Anesthesia Plan Comments:         Anesthesia Quick Evaluation

## 2020-09-11 NOTE — Procedures (Signed)
INTERVENTIONAL NEURORADIOLOGY BRIEF POSTPROCEDURE NOTE  Diagnostic cerebral angiogram and mechanical thrombectomy  Attending: Dr. Baldemar Lenis  Assistant: None.  Diagnosis: Right ICA occlusion at the bulb and right M1/MCA occlusion.  Access site: RCFA  Access closure: Perclose Prostyle  Anesthesia: General  Medication used: cangrelor bolus + low dose drip.  Complications: None.  Estimated blood loss: 100 mL  Specimen: None.  Findings: There was an occlusion of the cervical right ICA at the bulb and distal right M1/MCA occlusion. Mechanical thrombectomy performed with combined stent retriever (embotrap) and direct contact aspiration with complete recanalization (TICI3). Following, angioplasty and stenting of the right carotid bifurcation. Stable non flow limiting stenosis of the mid cervical ICA noted, not progressed on delay images.   The patient tolerated the procedure well without incident or complication and is in stable condition.   PLAN: - SBP 120-140 mmHg. - Bed rest x6 hours post femoral puncture - Continue cangrelor drip for 4h, following, resume DAPT with ASA + plavix

## 2020-09-11 NOTE — Evaluation (Signed)
Occupational Therapy Evaluation Patient Details Name: [name] MRN: [id number] DOB: 06-14-1945 Today's Date: 09/11/2020    History of Present Illness 75 yo female presents [facility] on 5[facility] with 2-3 week history of dizziness, unsteadiness. 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 PMH significant for hypertension, COPD, aortic aneurysm, DVT in pregnancy, former smoking, nonadherence to medications for the past year, remote MI.   Clinical Impression   PTA patient was living with her daughter in a private residence and was independent with ADLs/IADLs. Patient did not drive at baseline. Patient currently presents below baseline level of function demonstrating need for Min guard to Min A grossly for completion of observed ADLs. Patient also limited by L inattention, mild L hemiparesis and decreased standing balance and would benefit from continued acute OT services in prep for safe d/c to next level of care. Recommendation for return home with daughter and initial 24hr supervision/assist with OPOT.     Follow Up Recommendations  Outpatient OT;Supervision/Assistance - 24 hour    Equipment Recommendations  None recommended by OT    Recommendations for Other Services       Precautions / Restrictions Precautions Precautions: Fall Precaution Comments: L inattention Restrictions Weight Bearing Restrictions: No      Mobility Bed Mobility Overal bed mobility: Needs Assistance Bed Mobility: Supine to Sit     Supine to sit: Min guard     General bed mobility comments: Patient seated in recliner upon entry.    Transfers Overall transfer level: Needs assistance Equipment used: None Transfers: Sit to/from Stand Sit to Stand: Min assist         General transfer comment: min  assist to steady upon standing, pt reaching for environment to self-steady as well.    Balance Overall balance assessment: Needs assistance Sitting-balance support: Feet supported;No upper extremity supported Sitting balance-Leahy Scale: Good     Standing balance support: No upper extremity supported;During functional activity Standing balance-Leahy Scale: Fair               High level balance activites: Head turns;Direction changes High Level Balance Comments: Increased unsteadiness with horizontal/vertical head turns, directional changes.           ADL either performed or assessed with clinical judgement   ADL Overall ADL's : Needs assistance/impaired     Grooming: Set up;Sitting               Lower Body Dressing: Min guard;Sit to/from stand Lower Body Dressing Details (indicate cue type and reason): Able to doff/don footwear in sitting. Use of BUE to assist LLE into figure-4 position 2/2 LLE weakness. Toilet Transfer: Hydrographic surveyor Details (indicate cue type and reason): Simulated with transfer to recliner.           General ADL Comments: Patient limited by L-sided weakness, L inattention.     Vision Baseline Vision/History: Wears glasses Wears Glasses: Reading only Patient Visual Report: No change from baseline Vision Assessment?: Vision impaired- to be further tested in functional context Additional Comments: Patient with difficulty following cues for formal assessment of vision. Unable to correctly identify time on wall clock (10:50). Patient with difficulty reading words on L side of paper. Able to draw clock with numbers mildly misplaced and set time for 1/2 past 10.     Perception  Praxis      Pertinent Vitals/Pain Pain Assessment: No/denies pain     Hand Dominance Right   Extremity/Trunk Assessment Upper Extremity Assessment Upper Extremity Assessment: LUE deficits/detail LUE Deficits / Details: AROM WFL with increased  time. MMT grossly 4-/5. LUE Sensation: WNL LUE Coordination: decreased fine motor;decreased gross motor   Lower Extremity Assessment Lower Extremity Assessment: Defer to PT evaluation LLE Deficits / Details: MMT: 3/5 hip flexion, hip abd/add 3/5, 3+/5 knee extension, 3+/5 knee flexion, 5/5 DF/PF   Cervical / Trunk Assessment Cervical / Trunk Assessment: Normal   Communication Communication Communication: No difficulties   Cognition Arousal/Alertness: Awake/alert Behavior During Therapy: WFL for tasks assessed/performed;Flat affect Overall Cognitive Status: No family/caregiver present to determine baseline cognitive functioning Area of Impairment: Attention;Memory;Following commands;Safety/judgement;Problem solving                   Current Attention Level: Selective Memory: Decreased short-term memory;Decreased recall of precautions Following Commands: Follows one step commands with increased time Safety/Judgement: Decreased awareness of deficits;Decreased awareness of safety   Problem Solving: Requires verbal cues;Requires tactile cues General Comments: Patient with difficulty following commands necessary for formal testing of vision requiring repeat cues.   General Comments  Visual field testing: L lateral visual field deficit noted, but difficult to discern if this is due to pt cognition vs inattention. + extinction LLE (cue "lift your arms" pt only lifts R). Object finding in hallway: 0/3 items on L identified, 3/3 on R    Exercises     Shoulder Instructions      Home Living Family/patient expects to be discharged to:: Private residence Living Arrangements: Children Available Help at Discharge: Family;Available 24 hours/day Type of Home: Apartment Home Access: Stairs to enter Entrance Stairs-Number of Steps: 2   Home Layout: Two level;Bed/bath upstairs Alternate Level Stairs-Number of Steps: flight Alternate Level Stairs-Rails: Left Bathroom Shower/Tub:  Chief Strategy Officer: Standard     Home Equipment: None          Prior Functioning/Environment Level of Independence: Independent        Comments: Independent with ADLs/IADLs. Does not drive at baseline.        OT Problem List: Decreased strength;Impaired balance (sitting and/or standing);Impaired vision/perception;Decreased coordination;Decreased safety awareness;Impaired UE functional use      OT Treatment/Interventions: Self-care/ADL training;Therapeutic exercise;Neuromuscular education;Energy conservation;Therapeutic activities;Cognitive remediation/compensation;Visual/perceptual remediation/compensation;Patient/family education;Balance training    OT Goals(Current goals can be found in the care plan section) Acute Rehab OT Goals Patient Stated Goal: To return home. OT Goal Formulation: With patient Time For Goal Achievement: 09/25/20 Potential to Achieve Goals: Good ADL Goals Pt Will Perform Grooming: standing;with supervision Pt Will Perform Upper Body Dressing: with modified independence;sitting;standing Pt Will Perform Lower Body Dressing: sit to/from stand;with supervision Pt Will Transfer to Toilet: with supervision;ambulating Pt Will Perform Toileting - Clothing Manipulation and hygiene: with supervision;sit to/from stand Additional ADL Goal #1: Patient will recall and demo 3 compensatory strategies for L inattention. Additional ADL Goal #2: Patient will demo LUE NMR HEP with use of written handout and supervision A.  OT Frequency: Min 2X/week   Barriers to D/C:            Co-evaluation              AM-PAC OT "6 Clicks" Daily Activity     Outcome Measure Help from another person eating meals?: None Help from another person taking care of personal grooming?: A Little Help from another person toileting, which includes using  toliet, bedpan, or urinal?: A Little Help from another person bathing (including washing, rinsing, drying)?: A  Little Help from another person to put on and taking off regular upper body clothing?: A Little Help from another person to put on and taking off regular lower body clothing?: A Little 6 Click Score: 19   End of Session Nurse Communication: Mobility status  Activity Tolerance: Patient tolerated treatment well Patient left: in chair;with call bell/phone within reach;with chair alarm set  OT Visit Diagnosis: Unsteadiness on feet (R26.81);Muscle weakness (generalized) (M62.81);Low vision, both eyes (H54.2)                Time: 1040-1109 OT Time Calculation (min): 29 min Charges:  OT General Charges $OT Visit: 1 Visit OT Evaluation $OT Eval Low Complexity: 1 Low OT Treatments $Neuromuscular Re-education: 8-22 mins  [name] H. OTR/L Supplemental OT, Department of rehab services [phone]  [name] R H. 09/11/2020, 3:24 PM

## 2020-09-11 NOTE — Plan of Care (Signed)
  Problem: Education: Goal: Knowledge of General Education information will improve Description: Including pain rating scale, medication(s)/side effects and non-pharmacologic comfort measures 09/11/2020 0225 by Aubery Lapping, RN Outcome: Progressing 09/11/2020 0218 by Aubery Lapping, RN Outcome: Progressing   Problem: Health Behavior/Discharge Planning: Goal: Ability to manage health-related needs will improve 09/11/2020 0225 by Aubery Lapping, RN Outcome: Progressing 09/11/2020 0218 by Aubery Lapping, RN Outcome: Progressing   Problem: Clinical Measurements: Goal: Ability to maintain clinical measurements within normal limits will improve 09/11/2020 0225 by Aubery Lapping, RN Outcome: Progressing 09/11/2020 0218 by Aubery Lapping, RN Outcome: Progressing Goal: Will remain free from infection 09/11/2020 0225 by Aubery Lapping, RN Outcome: Progressing 09/11/2020 0218 by Aubery Lapping, RN Outcome: Progressing Goal: Diagnostic test results will improve 09/11/2020 0225 by Aubery Lapping, RN Outcome: Progressing 09/11/2020 0218 by Aubery Lapping, RN Outcome: Progressing Goal: Respiratory complications will improve 09/11/2020 0225 by Aubery Lapping, RN Outcome: Progressing 09/11/2020 0218 by Aubery Lapping, RN Outcome: Progressing Goal: Cardiovascular complication will be avoided 09/11/2020 0225 by Aubery Lapping, RN Outcome: Progressing 09/11/2020 0218 by Aubery Lapping, RN Outcome: Progressing   Problem: Activity: Goal: Risk for activity intolerance will decrease 09/11/2020 0225 by Aubery Lapping, RN Outcome: Progressing 09/11/2020 0218 by Aubery Lapping, RN Outcome: Progressing   Problem: Nutrition: Goal: Adequate nutrition will be maintained 09/11/2020 0225 by Aubery Lapping, RN Outcome: Progressing 09/11/2020 0218 by Aubery Lapping, RN Outcome: Progressing   Problem: Coping: Goal: Level  of anxiety will decrease 09/11/2020 0225 by Aubery Lapping, RN Outcome: Progressing 09/11/2020 0218 by Aubery Lapping, RN Outcome: Progressing   Problem: Elimination: Goal: Will not experience complications related to bowel motility 09/11/2020 0225 by Aubery Lapping, RN Outcome: Progressing 09/11/2020 0218 by Aubery Lapping, RN Outcome: Progressing Goal: Will not experience complications related to urinary retention 09/11/2020 0225 by Aubery Lapping, RN Outcome: Progressing 09/11/2020 0218 by Aubery Lapping, RN Outcome: Progressing   Problem: Pain Managment: Goal: General experience of comfort will improve 09/11/2020 0225 by Aubery Lapping, RN Outcome: Progressing 09/11/2020 0218 by Aubery Lapping, RN Outcome: Progressing   Problem: Safety: Goal: Ability to remain free from injury will improve 09/11/2020 0225 by Aubery Lapping, RN Outcome: Progressing 09/11/2020 0218 by Aubery Lapping, RN Outcome: Progressing   Problem: Skin Integrity: Goal: Risk for impaired skin integrity will decrease 09/11/2020 0225 by Aubery Lapping, RN Outcome: Progressing 09/11/2020 0218 by Aubery Lapping, RN Outcome: Progressing

## 2020-09-11 NOTE — Consult Note (Addendum)
Neurology Consultation  Reason for Consult: Right parietal acute and subacute infarct with a few additional punctate acute to subacute infarcts in the right centrum semiovale identified on MRI.   Referring Physician: Dr. Rhona Leavens  CC: Dizziness x 2-3 weeks  History is obtained from: Chart review, Patient  HPI: [name] is a 75 y.o. female with a medical history significant for HTN, COPD, aortic aneurysm, DVT in pregnancy, remote smoking history, non-adherence to medications for the past year, and a remote MI who presented to [facility] 5/5 for evaluation of feeling dizzy and off balance for 2 - 3 weeks causing her to run into walls and bump her head.   Per Dr. Rollene Fare initial evaluation 5/5: "She recently moved from [place] so much of her care is outside of our system.  She reports she has been feeling dizzy/off balance for 2 to 3 weeks.  She has run into walls 2-3 times in the past week usually with a doorway towards the right and the wall on the left.  She has also been having some intermittent left-sided weakness that has been coming and going.  This morning she woke up particularly dizzy and off balance.  EMS was activated around 1 PM but she refused transport initially.  Family then convinced her to come to the hospital for further evaluation and therefore she presented around 3:30 PM.  Initially she was neuro intact but then while in triage was noted to have developed a right-MCA syndrome for which code stroke was activated given she had been initially grossly nonfocal on triage exam.  LKW: 1-2 weeks tpa given?: No, due to out of the window IR Thrombectomy? No, due to out of the window Modified Rankin Scale: 0-Completely asymptomatic and back to baseline post- stroke Time of teleneurologist evaluation: 4:05 PM"  She was transferred to [facility] for further stroke evaluation and for close neuro monitoring with low threshold for stroke activation with further neurologic deficits. She does endorse  headaches recently she feels are coming from her right ear that radiate to her right forehead and face. She states her last headache was yesterday 5/5 and denies current headache.   ROS: A complete ROS was performed and is negative except as noted in the HPI.   Past Medical History:  Diagnosis Date  . COPD (chronic obstructive pulmonary disease) (HCC)   . History of DVT (deep vein thrombosis)   . Hypertension   . Thoracoabdominal aortic aneurysm [facility])    Past Surgical History:  Procedure Laterality Date  . NO PAST SURGERIES     History reviewed. No pertinent family history.  Social History:   reports that she quit smoking about 33 years ago. She has never used smokeless tobacco. She reports that she does not drink alcohol and does not use drugs.  Medications  Current Facility-Administered Medications:  .  0.9 %  sodium chloride infusion, , Intravenous, Continuous, Cipriano Bunker, MD, Last Rate: 10 mL/hr at 09/10/20 1802, New Bag at 09/10/20 1802 .  acetaminophen (TYLENOL) tablet 650 mg, 650 mg, Oral, Q4H PRN **OR** acetaminophen (TYLENOL) 160 MG/5ML solution 650 mg, 650 mg, Per Tube, Q4H PRN **OR** acetaminophen (TYLENOL) suppository 650 mg, 650 mg, Rectal, Q4H PRN, Cipriano Bunker, MD .  [address] aspirin tablet 325 mg, 325 mg, Oral, Once, 325 mg at 09/10/20 1759 **FOLLOWED BY** aspirin EC tablet 81 mg, 81 mg, Oral, Daily, Cipriano Bunker, MD .  atorvastatin (LIPITOR) tablet 80 mg, 80 mg, Oral, QHS, Cipriano Bunker, MD, 80 mg at 09/10/20  2221 .  [COMPLETED] clopidogrel (PLAVIX) tablet 300 mg, 300 mg, Oral, Once, 300 mg at 09/10/20 1759 **FOLLOWED BY** clopidogrel (PLAVIX) tablet 75 mg, 75 mg, Oral, Daily, Cipriano Bunker, MD .  hydrALAZINE (APRESOLINE) injection 5-10 mg, 5-10 mg, Intravenous, Q4H PRN, Hillary Bow, DO, 10 mg at 09/11/20 0131 .  senna-docusate (Senokot-S) tablet 1 tablet, 1 tablet, Oral, QHS PRN, Cipriano Bunker, MD   Current Outpatient Medications  Medication  Instructions  . ibuprofen (ADVIL) 200 mg, Oral, Every 6 hours PRN   Exam: Current vital signs: BP (!) 165/45 (BP Location: Right Arm)   Pulse (!) 50   Temp 98.5 F (36.9 C) (Oral)   Resp 16   Ht 5\' 10"  (1.778 m)   Wt 95.3 kg   SpO2 100%   BMI 30.13 kg/m  Vital signs in last 24 hours: Temp:  [98.5 F (36.9 C)-98.9 F (37.2 C)] 98.5 F (36.9 C) (05/06 0330) Pulse Rate:  [43-76] 50 (05/06 0330) Resp:  [13-22] 16 (05/06 0330) BP: (165-226)/(45-100) 165/45 (05/06 0330) SpO2:  [95 %-100 %] 100 % (05/06 0330) Weight:  [95.3 kg] 95.3 kg (05/05 1454)  GENERAL: Sleeping in bed, awakes to voice, in no acute distress Psych: Affect appropriate for situation, calm and cooperative with examination Head: Normocephalic and atraumatic, dry mm EENT: Arcus senilis bilaterally, normal conjunctivae, no OP obstruction, poor dentition LUNGS: Normal respiratory effort. Non-labored breathing. CV: Bradycardic on cardiac monitor, extremities warm, well perfused without edema ABDOMEN: Soft, non-tender Ext: warm, without obvious deformity  NEURO:  Mental Status: Awake, alert, and oriented to self, age, month, year, situation, and location. She is able to provide a clear and coherent history of present illness. Speech is intact with mild dysarthria (poor dentition noted, patient endorses speech is at baseline) but without aphasia. No neglect is noted. Naming, repetition, fluency, and comprehension are intact. Cranial Nerves:  II: PERRL 3 mm/brisk. Visual fields full.  III, IV, VI: EOMI without ptosis V: Sensation is intact to light touch and symmetrical to face.  VII: Face is symmetric resting and smiling.   VIII: Hearing is intact to voice IX, X: Palate elevation is symmetric. Phonation normal.  XI: Normal sternocleidomastoid and trapezius muscle strength XII: Tongue protrudes midline without fasciculations.   Motor: 5/5 strength is all muscle groups without vertical drift. Tone is normal. Bulk is  normal.  Sensation: Intact to light touch bilaterally in all four extremities. No extinction to DSS present.  Coordination: FTN intact bilaterally. HKS intact bilaterally.  DTRs: 1+ and symmetric patellae, 2+ and symmetric biceps.  Gait- deferred  NIHSS: 1a Level of Conscious.: 0 1b LOC Questions: 0 1c LOC Commands: 0 2 Best Gaze: 0 3 Visual: 0 4 Facial Palsy: 0 5a Motor Arm - left: 0 5b Motor Arm - Right: 0 6a Motor Leg - Left: 0 6b Motor Leg - Right: 0 7 Limb Ataxia: 0 8 Sensory: 0 9 Best Language: 0 10 Dysarthria: 1 11 Extinct. and Inatten.: 0 TOTAL: 1; speech reported at baseline per patient  Labs I have reviewed labs in epic and the results pertinent to this consultation are: CBC    Component Value Date/Time   WBC 4.4 09/10/2020 1540   RBC 4.23 09/10/2020 1540   HGB 12.6 09/10/2020 1559   HCT 37.0 09/10/2020 1559   PLT 141 (L) 09/10/2020 1540   MCV 90.5 09/10/2020 1540   MCH 28.8 09/10/2020 1540   MCHC 31.9 09/10/2020 1540   RDW 13.1 09/10/2020 1540   LYMPHSABS 1.6  09/10/2020 1540   MONOABS 0.5 09/10/2020 1540   EOSABS 0.3 09/10/2020 1540   BASOSABS 0.0 09/10/2020 1540   CMP     Component Value Date/Time   NA 143 09/10/2020 1559   K 3.7 09/10/2020 1559   CL 105 09/10/2020 1559   CO2 26 09/10/2020 1540   GLUCOSE 84 09/10/2020 1559   BUN 20 09/10/2020 1559   CREATININE 0.90 09/10/2020 1559   CALCIUM 8.7 (L) 09/10/2020 1540   PROT 7.1 09/10/2020 1540   ALBUMIN 3.6 09/10/2020 1540   AST 17 09/10/2020 1540   ALT 8 09/10/2020 1540   ALKPHOS 67 09/10/2020 1540   BILITOT 0.4 09/10/2020 1540   GFRNONAA >60 09/10/2020 1540   GFRAA >60 11/17/2010 0415   Lipid Panel     Component Value Date/Time   CHOL 140 11/16/2010 0535   TRIG 91 11/16/2010 0535   HDL 45 11/16/2010 0535   CHOLHDL 3.1 11/16/2010 0535   VLDL 18 11/16/2010 0535   LDLCALC 77 11/16/2010 0535   No results found for: HGBA1C  Imaging I have reviewed the images obtained:  CT Head Code  Stroke:  1. No acute abnormality. 2. Chronic infarct right parietal lobe. Microvascular ischemic change in the white matter 3. ASPECTS is 10  CT Angio Head and Neck: Age-indeterminate occlusion of the right cervical ICA just beyond the origin. At least partial reconstitution at the clinoid intracranially. Less than 50% stenosis at the left ICA origin. Mild to moderate intracranial atherosclerosis.  MRI examination of the brain: Right parietal small to moderate size acute and subacute infarct as well as a few additional punctate acute to subacute right centrum semiovale infarcts. No evidence of hemorrhage.  Assessment: 75 year old woman who presented for evaluation of 2 - 3 weeks of feeling dizzy/off balance who developed a transient R MCA syndrome while in triage. - History is concerning for intermittent left-sided weakness localizing to the right MCA territory. - Stroke risk factors include: remote smoking history, HTN, remote MI, obesity, age, and nonadherence to medications.  - CT imaging with no acute intracranial abnormality. CTA head and neck with age-indeterminate occlusion of the right cervical ICA which was felt to be chronic. MRI with right parietal small to moderate size acute to subacute infarct with a few additional punctate acute to subacute right centrum semiovale infarcts.  - Due to resolution of symptoms during evaluation- with increased blood pressure, and onset (2 - 3 weeks PTA), the risk of acute intervention outweighed the benefits and she was admitted for further evaluation with permissive hypertension and a low threshold for stroke reactivation with recurrence of neurologic decline.   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 Obtaining a baseline CK level - Risk factor modification - Telemetry monitoring; 30 day event monitor on discharge if no arrythmias captured  - Blood pressure goal              - Permissive hypertension for 24h to 220/120 due to concern for pressure dependent examination with gradual reduction following 24h. Subsequently  lower gradually to a long term SBP goal of 130-150 due to requirement for good collateral flow to the right cerebral hemisphere given her chronic-appearing right ICA occlusion. Maintain good fluid intake at all times. - Please reactivate code stroke if patient acutely declines, lay flat and fluid bolus with normal saline while awaiting neurology evaluation - Stroke team to follow in consultation  Pt seen by NP/Neuro   [name], AGAC-NP [facility] Pager: [phone]  I have seen and examined the patient. I have reviewed the assessment and recommendations, with amendations made. 75 year old female who presented for evaluation of 2-3 weeks of feeling dizzy/off balance and subsequently developed a transient R MCA syndrome while in triage. Symptomatic right carotid artery occlusion seen on imaging. MRI reveals acute to subacute right parietal infarct with additional punctate infarcts. Etiology most likely transient hypotension in the setting of the right carotid artery occlusion. Recommendations as above.   Electronically signed: Dr. Caryl Pina

## 2020-09-11 NOTE — Plan of Care (Signed)

## 2020-09-11 NOTE — Progress Notes (Signed)
SLP Cancellation Note  Patient Details Name: [name] MRN: [id number] DOB: 1946-03-06   Cancelled treatment:       Reason Eval/Treat Not Completed: Patient at procedure or test/unavailable (Pt eating breakfast. SLP will f/u later for speech/language/cognition evaluation)  Carrieanne Kleen I. Vear Clock, MS, CCC-SLP Acute Rehabilitation Services Office number [phone] Pager [phone]  Scheryl Marten 09/11/2020, 11:22 AM

## 2020-09-11 NOTE — Evaluation (Signed)
Physical Therapy Evaluation Patient Details Name: [name] MRN: [id number] DOB: [birth year] Today's Date: 09/11/2020   History of Present Illness  75 yo female presents to [facility] on 5/5 with 2-3 week history of dizziness, unsteadiness. 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 PMH significant for hypertension, COPD, aortic aneurysm, DVT in pregnancy, former smoking, nonadherence to medications for the past year, remote MI.  Clinical Impression   Pt presents with mild L hemiparesis, L inattention, decreased insight into deficits, impaired standing balance vs baseline, impaired gait, and decreased activity tolerance. Pt to benefit from acute PT to address deficits. Pt ambulated hallway distance without AD and close guard for safety, pt reaching for environment frequently to steady self and inattentive to L. PT recommending OPPT to address mobility and perceptual deficits, has great family support at home and daughter will help her as needed at d/c. PT to progress mobility as tolerated, and will continue to follow acutely.      Follow Up Recommendations Outpatient PT;Supervision for mobility/OOB    Equipment Recommendations  None recommended by PT    Recommendations for Other Services       Precautions / Restrictions Precautions Precautions: Fall Precaution Comments: L inattention Restrictions Weight Bearing Restrictions: No      Mobility  Bed Mobility Overal bed mobility: Needs Assistance Bed Mobility: Supine to Sit     Supine to sit: Min guard     General bed mobility comments: Patient seated in recliner upon entry.    Transfers Overall transfer level: Needs assistance Equipment used: None Transfers: Sit to/from Stand Sit to Stand: Min assist         General  transfer comment: min assist to steady upon standing, pt reaching for environment to self-steady as well.  Ambulation/Gait Ambulation/Gait assistance: Min guard Gait Distance (Feet): 175 Feet Assistive device: None Gait Pattern/deviations: Step-through pattern;Decreased stride length;Wide base of support;Drifts right/left Gait velocity: decr   General Gait Details: close guard for safety, pt with widened BOS and veering R in hallway. Decreased visual scanning and L peripheral attention  Stairs            Wheelchair Mobility    Modified Rankin (Stroke Patients Only) Modified Rankin (Stroke Patients Only) Pre-Morbid Rankin Score: No symptoms Modified Rankin: Moderately severe disability     Balance Overall balance assessment: Needs assistance Sitting-balance support: Feet supported;No upper extremity supported Sitting balance-Leahy Scale: Good     Standing balance support: No upper extremity supported;During functional activity Standing balance-Leahy Scale: Fair               High level balance activites: Head turns;Direction changes High Level Balance Comments: Increased unsteadiness with horizontal/vertical head turns, directional changes.             Pertinent Vitals/Pain Pain Assessment: No/denies pain    Home Living Family/patient expects to be discharged to:: Private residence Living Arrangements: Children Available Help at Discharge: Family;Available 24 hours/day Type of Home: Apartment Home Access: Stairs to enter   Entrance Stairs-Number of Steps: 2 Home Layout: Two level;Bed/bath upstairs Home Equipment: None      Prior Function Level of Independence: Independent         Comments: Independent with ADLs/IADLs. Does not drive at baseline.     Hand Dominance   Dominant Hand: Right  Extremity/Trunk Assessment   Upper Extremity Assessment Upper Extremity Assessment: LUE deficits/detail LUE Deficits / Details: AROM WFL with  increased time. MMT grossly 4-/5. LUE Sensation: WNL LUE Coordination: decreased fine motor;decreased gross motor    Lower Extremity Assessment Lower Extremity Assessment: Defer to PT evaluation LLE Deficits / Details: MMT: 3/5 hip flexion, hip abd/add 3/5, 3+/5 knee extension, 3+/5 knee flexion, 5/5 DF/PF    Cervical / Trunk Assessment Cervical / Trunk Assessment: Normal  Communication   Communication: No difficulties  Cognition Arousal/Alertness: Awake/alert Behavior During Therapy: WFL for tasks assessed/performed;Flat affect Overall Cognitive Status: No family/caregiver present to determine baseline cognitive functioning Area of Impairment: Attention;Memory;Following commands;Safety/judgement;Problem solving                   Current Attention Level: Selective Memory: Decreased short-term memory;Decreased recall of precautions Following Commands: Follows one step commands with increased time Safety/Judgement: Decreased awareness of deficits;Decreased awareness of safety   Problem Solving: Requires verbal cues;Requires tactile cues General Comments: Patient with difficulty following commands necessary for formal testing of vision requiring repeat cues.      General Comments General comments (skin integrity, edema, etc.): Visual field testing: L lateral visual field deficit noted, but difficult to discern if this is due to pt cognition vs inattention. + extinction LLE (cue "lift your arms" pt only lifts R). Object finding in hallway: 0/3 items on L identified, 3/3 on R    Exercises     Assessment/Plan    PT Assessment Patient needs continued PT services  PT Problem List Decreased strength;Decreased mobility;Decreased safety awareness;Decreased coordination;Decreased activity tolerance;Decreased cognition;Decreased balance       PT Treatment Interventions Therapeutic activities;Gait training;Therapeutic exercise;Patient/family education;Stair training;Balance  training;Functional mobility training;Neuromuscular re-education    PT Goals (Current goals can be found in the Care Plan section)  Acute Rehab PT Goals Patient Stated Goal: To return home. PT Goal Formulation: With patient Time For Goal Achievement: 09/25/20 Potential to Achieve Goals: Good    Frequency Min 4X/week   Barriers to discharge        Co-evaluation               AM-PAC PT "6 Clicks" Mobility  Outcome Measure Help needed turning from your back to your side while in a flat bed without using bedrails?: A Little Help needed moving from lying on your back to sitting on the side of a flat bed without using bedrails?: A Little Help needed moving to and from a bed to a chair (including a wheelchair)?: A Little Help needed standing up from a chair using your arms (e.g., wheelchair or bedside chair)?: A Little Help needed to walk in hospital room?: A Little Help needed climbing 3-5 steps with a railing? : A Little 6 Click Score: 18    End of Session Equipment Utilized During Treatment: Gait belt Activity Tolerance: Patient tolerated treatment well;Patient limited by fatigue Patient left: in chair;with chair alarm set;with call bell/phone within reach Nurse Communication: Mobility status PT Visit Diagnosis: Other abnormalities of gait and mobility (R26.89);Difficulty in walking, not elsewhere classified (R26.2);Other symptoms and signs involving the nervous system (R29.898)    Time: 8315-1761 PT Time Calculation (min) (ACUTE ONLY): 30 min   Charges:   PT Evaluation $PT Eval Low Complexity: 1 Low PT Treatments $Gait Training: 8-22 mins      Marye Round, PT DPT Acute Rehabilitation Services Pager [phone]  Office [phone]   Tyrone Apple E Christain [place] 09/11/2020, 3:47 PM

## 2020-09-12 ENCOUNTER — Inpatient Hospital Stay (HOSPITAL_COMMUNITY): Payer: Medicare Other

## 2020-09-12 DIAGNOSIS — G459 Transient cerebral ischemic attack, unspecified: Secondary | ICD-10-CM | POA: Diagnosis not present

## 2020-09-12 LAB — CBC
HCT: 37.4 % (ref 36.0–46.0)
Hemoglobin: 12 g/dL (ref 12.0–15.0)
MCH: 28.8 pg (ref 26.0–34.0)
MCHC: 32.1 g/dL (ref 30.0–36.0)
MCV: 89.9 fL (ref 80.0–100.0)
Platelets: 156 10*3/uL (ref 150–400)
RBC: 4.16 MIL/uL (ref 3.87–5.11)
RDW: 13.2 % (ref 11.5–15.5)
WBC: 4.3 10*3/uL (ref 4.0–10.5)
nRBC: 0 % (ref 0.0–0.2)

## 2020-09-12 LAB — LIPID PANEL
Cholesterol: 230 mg/dL — ABNORMAL HIGH (ref 0–200)
HDL: 53 mg/dL (ref >40)
LDL Cholesterol: 160 mg/dL — ABNORMAL HIGH (ref 0–99)
Total CHOL/HDL Ratio: 4.3 RATIO
Triglycerides: 83 mg/dL (ref <150)
VLDL: 17 mg/dL (ref 0–40)

## 2020-09-12 LAB — ECHOCARDIOGRAM COMPLETE
AR max vel: 3.26 cm2
AV Area VTI: 3.12 cm2
AV Area mean vel: 3.1 cm2
AV Mean grad: 4 mmHg
AV Peak grad: 9.5 mmHg
Ao pk vel: 1.54 m/s
Area-P 1/2: 2.48 cm2
Calc EF: 70.6 %
Height: 70 in
Single Plane A2C EF: 56.7 %
Single Plane A4C EF: 78.5 %
Weight: 3360 oz

## 2020-09-12 LAB — CK: Total CK: 78 U/L (ref 38–234)

## 2020-09-12 MED ORDER — CLEVIDIPINE BUTYRATE 0.5 MG/ML IV EMUL
0.0000 mg/h | INTRAVENOUS | Status: DC
  Administered 2020-09-12: 5 mg/h via INTRAVENOUS
  Administered 2020-09-12: 7 mg/h via INTRAVENOUS
  Administered 2020-09-15: 1 mg/h via INTRAVENOUS
  Administered 2020-09-16 (×2): 8 mg/h via INTRAVENOUS
  Filled 2020-09-12 (×10): qty 50

## 2020-09-12 MED ORDER — CHLORHEXIDINE GLUCONATE CLOTH 2 % EX PADS
6.0000 | MEDICATED_PAD | Freq: Every day | CUTANEOUS | Status: DC
  Administered 2020-09-12 – 2020-09-16 (×5): 6 via TOPICAL

## 2020-09-12 NOTE — Evaluation (Signed)
Physical Therapy Re-Evaluation Patient Details Name: [name] MRN: [id number] DOB: 06-26-1945 Today's Date: 09/12/2020   History of Present Illness  75 yo female presents to [facility] on 5/5 with 2-3 week history of dizziness, unsteadiness. 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 Rapid response called 5/6 due to new onset L sided weakness, pt taken to IR for thrombectomy of R ICA/MCA occlusions. PMH significant for hypertension, COPD, aortic aneurysm, DVT in pregnancy, former smoking, nonadherence to medications for the past year, remote MI.    Clinical Impression  The pt was seen by PT/OT to maximize safety with mobility after rapid response yesterday. The pt was initially lethargic, but generally awoke with increased verbal stimuli and elevation of HOB. The pt then completed bed mobility and initial sit-stand transfers with modA of 2 to manage movement of LLE, elevation of trunk from [facility], and to steady in standing. The pt was then able to complete short bout of lateral steps with modA of 2 prior to needing seated rest break. This is significant regression from evaluation yesterday where pt was able to walk hallway distances with minG and no AD. Therefore, updated recommendations to CIR level therapies at d/c to facilitate return to prior level of mobility and independence.     Follow Up Recommendations CIR    Equipment Recommendations   (defer to post acute)    Recommendations for Other Services Rehab consult     Precautions / Restrictions Precautions Precautions: Fall Precaution Comments: L inattention Restrictions Weight Bearing Restrictions: No      Mobility  Bed Mobility Overal bed mobility: Needs Assistance Bed Mobility: Supine to Sit     Supine to sit: Mod assist;HOB  elevated     General bed mobility comments: Assist for trunk and to continue bringing BLEs over to EOB.    Transfers Overall transfer level: Needs assistance Equipment used: 2 person hand held assist Transfers: Sit to/from Stand;Lateral/Scoot Transfers Sit to Stand: Mod assist;+2 physical assistance;From elevated surface        Lateral/Scoot Transfers: Min guard General transfer comment: ModA +2 for taking steps to [facility], pt abruptly sitting down and then asked to take more steps to [facility] with ModA +2. Pt laterally scooting to [facility], but moving very little without assist.  Ambulation/Gait Ambulation/Gait assistance: Mod assist;+2 physical assistance Gait Distance (Feet): 3 Feet Assistive device: 2 person hand held assist Gait Pattern/deviations: Step-to pattern;Decreased stride length;Shuffle Gait velocity: decr   General Gait Details: pt with short lateral steps along EOB, minimal clearance bilaterally with increased UE support to steady and maintain balance. minA to facilitate wt shift for stepping.     Modified Rankin (Stroke Patients Only) Modified Rankin (Stroke Patients Only) Pre-Morbid Rankin Score: No symptoms Modified Rankin: Moderately severe disability     Balance Overall balance assessment: Needs assistance Sitting-balance support: Bilateral upper extremity supported;Feet supported Sitting balance-Leahy Scale: Fair     Standing balance support: Bilateral upper extremity supported Standing balance-Leahy Scale: Poor Standing balance comment: +2 modA for stance                             Pertinent Vitals/Pain Pain Assessment: No/denies pain    Home Living Family/patient expects to be discharged to:: Private residence Living Arrangements: Children Available Help at  Discharge: Family;Available 24 hours/day Type of Home: Apartment Home Access: Stairs to enter Entrance Stairs-Rails: None Entrance Stairs-Number of Steps: 2 Home Layout: Two  level;Bed/bath upstairs Home Equipment: None      Prior Function Level of Independence: Independent         Comments: Independent with ADLs/IADLs. Does not drive at baseline.     Hand Dominance   Dominant Hand: Right    Extremity/Trunk Assessment   Upper Extremity Assessment Upper Extremity Assessment: Defer to OT evaluation RUE Deficits / Details: strength 5/5, AROM is WNLs LUE Deficits / Details: AROM WFL with increased time. MMT grossly 4/5. LUE Coordination: decreased fine motor;decreased gross motor    Lower Extremity Assessment Lower Extremity Assessment: Generalized weakness;LLE deficits/detail LLE Deficits / Details: grossly 3+/5 with very limited endurance. pt denies difference in sensation    Cervical / Trunk Assessment Cervical / Trunk Assessment: Normal  Communication   Communication: HOH  Cognition Arousal/Alertness: Lethargic Behavior During Therapy: Flat affect Overall Cognitive Status: No family/caregiver present to determine baseline cognitive functioning Area of Impairment: Attention;Memory;Following commands;Safety/judgement;Problem solving;Awareness                   Current Attention Level: Selective Memory: Decreased short-term memory Following Commands: Follows one step commands with increased time Safety/Judgement: Decreased awareness of deficits;Decreased awareness of safety Awareness: Emergent Problem Solving: Requires verbal cues;Requires tactile cues;Slow processing;Decreased initiation General Comments: Pt appears HOH and often needs directions repeated. Pt currently with deficits in multi step commands and problem solving. Pt slow to respon and initiate.      General Comments General comments (skin integrity, edema, etc.): Low BP at rest: 95/68 and 127/69 at EOB; brady at rest 45 BPM, 67 BPM with exertion. O2 on 2L >90% throughout tasks.    Exercises     Assessment/Plan    PT Assessment Patient needs continued PT services   PT Problem List Decreased strength;Decreased mobility;Decreased safety awareness;Decreased coordination;Decreased activity tolerance;Decreased cognition;Decreased balance       PT Treatment Interventions Therapeutic activities;Gait training;Therapeutic exercise;Patient/family education;Stair training;Balance training;Functional mobility training;Neuromuscular re-education    PT Goals (Current goals can be found in the Care Plan section)  Acute Rehab PT Goals Patient Stated Goal: to sleep PT Goal Formulation: With patient Time For Goal Achievement: 09/26/20 Potential to Achieve Goals: Good    Frequency Min 4X/week   Barriers to discharge        Co-evaluation PT/OT/SLP Co-Evaluation/Treatment: Yes Reason for Co-Treatment: Complexity of the patient's impairments (multi-system involvement);For patient/therapist safety;To address functional/ADL transfers PT goals addressed during session: Mobility/safety with mobility;Balance;Strengthening/ROM         AM-PAC PT "6 Clicks" Mobility  Outcome Measure Help needed turning from your back to your side while in a flat bed without using bedrails?: A Lot Help needed moving from lying on your back to sitting on the side of a flat bed without using bedrails?: A Lot Help needed moving to and from a bed to a chair (including a wheelchair)?: A Lot Help needed standing up from a chair using your arms (e.g., wheelchair or bedside chair)?: A Lot Help needed to walk in hospital room?: Total Help needed climbing 3-5 steps with a railing? : Total 6 Click Score: 10    End of Session Equipment Utilized During Treatment: Gait belt Activity Tolerance: Patient tolerated treatment well;Patient limited by fatigue Patient left: in bed;with call bell/phone within reach;with bed alarm set;with nursing/sitter in room Nurse Communication: Mobility status PT Visit Diagnosis: Other abnormalities of gait and  mobility (R26.89);Difficulty in walking, not elsewhere  classified (R26.2);Other symptoms and signs involving the nervous system (Z61.096)    Time: 0454-0981 PT Time Calculation (min) (ACUTE ONLY): 27 min   Charges:   PT Evaluation $PT Re-evaluation: 1 Re-eval          Rolm Baptise, PT, DPT   Acute Rehabilitation Department Pager #: [phone]  Gaetana Michaelis 09/12/2020, 6:20 PM

## 2020-09-12 NOTE — Progress Notes (Signed)
Seen immediately post procedure. Remains plegic on left with left sided neglect but very drowsy - just extubated and still on the IR table. Attempted calling daughter-no answer. SBP goal <140  Please call Beryle Beams (daughter) for updates. Number on chart.  -- Milon Dikes, MD Neurologist [facility] Pager: [phone]

## 2020-09-12 NOTE — Progress Notes (Signed)
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 Mechanical thrombectomy performed with combined stent retriever (embotrap) and direct contact aspiration with complete recanalization (TICI3). Following, angioplasty and stenting of the right carotid bifurcation. Stable non flow limiting stenosis of the mid cervical ICA noted, not progressed on delay images.   I have examined pt today She is moving Rt well to command Left leg and foot moving to command No movement on left arm  Dtr at bedside   Allergies: Patient has no known allergies.  Medications: Prior to Admission medications   Medication Sig Start Date End Date Taking? Authorizing Provider  ibuprofen (ADVIL) 200 MG tablet Take 200 mg by mouth every 6 (six) hours as needed for headache.   Yes [provider]     Vital Signs: BP (!) 119/48   Pulse (!) 37   Temp (!) 97.4 F (36.3 C) (Axillary)   Resp 14   Ht 5\' 10"  (1.778 m)   Wt 210 lb (95.3 kg)   SpO2 100%   BMI 30.13 kg/m   Physical Exam Vitals reviewed.  Constitutional:      Comments: opens eyes easily to me communicates some-- even with voice-- answers yes/no questions  Musculoskeletal:     Comments: Moves right well Arm/ hand and leg and foot Good grip Following to command  Left leg can move some-- to command Cannot bend knee--  No movement at left arm/hand  Rt groin is NT no bleeding no hematoma      Imaging: CT Angio Head W or Wo Contrast  Result Date: 09/10/2020 CLINICAL DATA:  Right gaze preference and left facial droop EXAM: CT ANGIOGRAPHY HEAD AND NECK TECHNIQUE: Multidetector CT imaging of the head and neck was performed using the standard  protocol during bolus administration of intravenous contrast. Multiplanar CT image reconstructions and MIPs were obtained to evaluate the vascular anatomy. Carotid stenosis measurements (when applicable) are obtained utilizing NASCET criteria, using the distal internal carotid diameter as the denominator. CONTRAST:  86mL OMNIPAQUE IOHEXOL 350 MG/ML SOLN COMPARISON:  None. FINDINGS: CTA NECK Aortic arch: Eccentric noncalcified plaque along the visualized distal aortic arch. Great vessel origins are patent. No high-grade proximal subclavian stenosis. Right carotid system: Common carotid is patent. There is mixed plaque at the bifurcation. ICA is occluded just beyond the origin. ECA is patent. Left carotid system: Patent. Atherosclerotic wall thickening along the common carotid. Mixed but primarily calcified plaque at the bifurcation and along the proximal internal carotid. There is less than 50% stenosis. Vertebral arteries: Patent. Left vertebral artery slightly dominant. Mild plaque at the origins. Skeleton: Degenerative changes of the included spine. Other neck: No acute abnormality. Upper chest: No apical lung mass. Review of the MIP images confirms the above findings CTA HEAD Anterior circulation: At least partial reconstitution of the right ICA at the clinoid where there is calcified plaque. Right anterior and middle cerebral arteries are patent. Atherosclerotic irregularity of the right M1 MCA with mild and moderate stenoses. Mild distal branch atherosclerotic irregularity. Left intracranial internal carotid arteries patent with calcified plaque causing mild stenosis. Left anterior and middle cerebral arteries are patent. Atherosclerotic irregularity of the left M1 MCA with mild stenosis. Mild distal branch atherosclerotic  irregularity. Posterior circulation: Intracranial vertebral arteries are patent. Basilar artery is patent. Major cerebellar artery origins are patent. Posterior cerebral arteries are patent.  Focal moderate stenosis of the right P2 PCA. A right posterior communicating artery is present. Venous sinuses: Patent as allowed by contrast bolus timing. 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 At least partial reconstitution at the clinoid intracranially. Less than 50% stenosis at the left ICA origin. Mild to moderate intracranial atherosclerosis. Results were discussed by telephone at the time of interpretation on 09/10/2020 at 4:34 pm with provider Dr. Iver NestleBhagat, who verbally acknowledged these results. Electronically Signed   By: Guadlupe SpanishPraneil  Patel M.D.   On: 09/10/2020 16:37   CT Angio Neck W and/or Wo Contrast  Result Date: 09/10/2020 CLINICAL DATA:  Right gaze preference and left facial droop EXAM: CT ANGIOGRAPHY HEAD AND NECK TECHNIQUE: Multidetector CT imaging of the head and neck was performed using the standard protocol during bolus administration of intravenous contrast. Multiplanar CT image reconstructions and MIPs were obtained to evaluate the vascular anatomy. Carotid stenosis measurements (when applicable) are obtained utilizing NASCET criteria, using the distal internal carotid diameter as the denominator. CONTRAST:  75mL OMNIPAQUE IOHEXOL 350 MG/ML SOLN COMPARISON:  None. FINDINGS: CTA NECK Aortic arch: Eccentric noncalcified plaque along the visualized distal aortic arch. Great vessel origins are patent. No high-grade proximal subclavian stenosis. Right carotid system: Common carotid is patent. There is mixed plaque at the bifurcation. ICA is occluded just beyond the origin. ECA is patent. Left carotid system: Patent. Atherosclerotic wall thickening along the common carotid. Mixed but primarily calcified plaque at the bifurcation and along the proximal internal carotid. There is less than 50% stenosis. Vertebral arteries: Patent. Left vertebral artery slightly dominant. Mild plaque at the origins.  Skeleton: Degenerative changes of the included spine. Other neck: No acute abnormality. Upper chest: No apical lung mass. Review of the MIP images confirms the above findings CTA HEAD Anterior circulation: At least partial reconstitution of the right ICA at the clinoid where there is calcified plaque. Right anterior and middle cerebral arteries are patent. Atherosclerotic irregularity of the right M1 MCA with mild and moderate stenoses. Mild distal branch atherosclerotic irregularity. Left intracranial internal carotid arteries patent with calcified plaque causing mild stenosis. Left anterior and middle cerebral arteries are patent. Atherosclerotic irregularity of the left M1 MCA with mild stenosis. Mild distal branch atherosclerotic irregularity. Posterior circulation: Intracranial vertebral arteries are patent. Basilar artery is patent. Major cerebellar artery origins are patent. Posterior cerebral arteries are patent. Focal moderate stenosis of the right P2 PCA. A right posterior communicating artery is present. Venous sinuses: Patent as allowed by contrast bolus timing. 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 At least partial reconstitution at the clinoid intracranially. Less than 50% stenosis at the left ICA origin. Mild to moderate intracranial atherosclerosis. Results were discussed by telephone at the time of interpretation on 09/10/2020 at 4:34 pm with provider Dr. Iver NestleBhagat, who verbally acknowledged these results. Electronically Signed   By: Guadlupe SpanishPraneil  Patel M.D.   On: 09/10/2020 16:37   MR BRAIN WO CONTRAST  Result Date: 09/10/2020 CLINICAL DATA:  ICA occlusion, code stroke follow-up EXAM: MRI HEAD WITHOUT CONTRAST TECHNIQUE: Multiplanar, multiecho pulse sequences of the brain and surrounding structures were obtained without intravenous contrast. COMPARISON:  Prior CT FINDINGS: Brain: There is diffusion  hyperintensity in  the right parietal lobe corresponding to infarct on CT. There is mixed ADC including hypointensity. Few additional punctate foci of mildly reduced diffusion in the right centrum semiovale. There is no evidence of intracranial hemorrhage. There is no intracranial mass or significant mass effect. There is no hydrocephalus or extra-axial fluid collection. Prominence of the ventricles and sulci reflects generalized parenchymal volume loss. Other patchy foci of T2 hyperintensity in the supratentorial white matter are nonspecific but may reflect mild chronic microvascular ischemic changes. Vascular: Loss of the flow void of the visualized cervical right ICA and proximal intracranial portion corresponding to findings on CTA. Skull and upper cervical spine: Normal marrow signal is preserved. Sinuses/Orbits: Paranasal sinus mucosal thickening with maxillary sinus air-fluid levels. Orbits are unremarkable. Other: Sella is unremarkable.  Mastoid air cells are clear. 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 No evidence of hemorrhage. Electronically Signed   By: Guadlupe Spanish M.D.   On: 09/10/2020 19:08   CT CEREBRAL PERFUSION W CONTRAST  Result Date: 09/11/2020 CLINICAL DATA:  Progressive left-sided weakness. EXAM: CT ANGIOGRAPHY HEAD AND NECK CT PERFUSION BRAIN TECHNIQUE: Multidetector CT imaging of the head and neck was performed using the standard protocol during bolus administration of intravenous contrast. Multiplanar CT image reconstructions and MIPs were obtained to evaluate the vascular anatomy. Carotid stenosis measurements (when applicable) are obtained utilizing NASCET criteria, using the distal internal carotid diameter as the denominator. Multiphase CT imaging of the brain was performed following IV bolus contrast injection. Subsequent parametric perfusion maps were  calculated using RAPID software. CONTRAST:  OMNIPAQUE IOHEXOL 350 MG/ML SOLN COMPARISON:  MR head without contrast, CT head without contrast, and CTA head and neck 09/10/2020. FINDINGS: CTA NECK FINDINGS Aortic arch: No significant interval change in the 3 vessel aortic arch with atherosclerotic calcifications. Right carotid system: The right common carotid artery is unremarkable. The right ICA is occluded at the bifurcation without significant reconstitution in the neck. Left carotid system: The left common carotid artery is within normal limits. Dense calcifications are present at the proximal ICA without a significant stenosis relative to the more distal vessel. There is mild tortuosity without significant stenosis in the cervical ICA. Vertebral arteries: Left vertebral artery is dominant vessel. Both vertebral arteries originate from the subclavian arteries without significant stenosis. No significant stenosis is present in either vertebral artery in the neck. Skeleton: Multilevel degenerative changes are present. Anterior osteophytes are fused at C3-4. No significant interval change. Other neck: No acute abnormality or significant interval change. Upper chest: Lung apices are clear.  Thoracic inlet is normal. Review of the MIP images confirms the above findings CTA HEAD FINDINGS Anterior circulation: The right internal carotid artery remains occluded through the ophthalmic segment. Atherosclerotic changes are present the cavernous left ICA without significant stenosis through the ICA terminus. The left A1 segment and right A1 segment are similar the prior exam. Proximal right M1 segment narrowing has progressed. Is some irregularity in the left M1 segment without significant stenosis. There is significant loss of perfusion right MCA branch vessels compared to prior exam. Left MCA branch vessels remain intact. ACA vessels are unchanged. Posterior circulation: Moderate narrowing is present at the dural  margin of the right vertebral artery, progressive from the prior exam. Left vertebral artery is unchanged. Vertebrobasilar junction normal. The basilar artery is small. Both posterior cerebral arteries originate from the basilar tip. Prominent right posterior communicating  artery is present. There is segmental attenuation PCA branch vessels similar the prior study. Venous sinuses: Dural sinuses are patent. Straight sinus and deep cerebral veins are intact. Anatomic variants: None Review of the MIP images confirms the above findings CT Brain Perfusion Findings: ASPECTS: 10/10 CBF (<30%) Volume: 65mL Perfusion (Tmax>6.0s) volume: Mismatch Volume: Infarction Location:Right MCA territory IMPRESSION: 1. 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 2. 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 3. Progressive proximal right M1 segment stenosis with significant loss of perfusion in the right MCA branch vessels. 4. Stable atherosclerotic changes at the left carotid bifurcation without significant stenosis. 5. 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 Basilar artery is similar the prior exam. 6.  Aortic Atherosclerosis (ICD10-I70.0). Electronically Signed   By: Marin Roberts M.D.   On: 09/11/2020 20:25   CT HEAD CODE STROKE WO CONTRAST  Result Date: 09/11/2020 CLINICAL DATA:  Code stroke. Neuro deficit. Acute stroke suspected. Dizziness and unsteady gait for 2 weeks. EXAM: CT HEAD WITHOUT CONTRAST TECHNIQUE: Contiguous axial images were obtained from the base of the skull through the vertex without intravenous contrast. COMPARISON:  CT head without contrast and CTA head and neck 09/10/2020. FINDINGS: Brain: Right parietal acute/subacute infarct stable. No new infarct present. Basal ganglia are intact. Insular ribbon normal. The ventricles are of normal size. No significant  extraaxial fluid collection is present. Vascular: Atherosclerotic calcifications are present within the cavernous internal carotid arteries bilaterally. No hyperdense vessel is present. Skull: Calvarium is intact. No focal lytic or blastic lesions are present. Sinuses/Orbits: Bilateral maxillary sinus fluid collections are present. Scattered ethmoid opacification noted. Sphenoid sinuses are clear. Frontal sinuses demonstrate mild mucosal thickening. Mastoid air cells are clear. The globes and orbits are within normal limits. ASPECTS [facility] Stroke Program Early CT Score) - Ganglionic level infarction (caudate, lentiform nuclei, internal capsule, insula, M1-M3 cortex): 7/7 - Supraganglionic infarction (M4-M6 cortex): 3/3 Total score (0-10 with 10 being normal): 10/10 IMPRESSION: 1. Stable appearance of right parietal acute/subacute infarct. 2. No new infarct. 3. ASPECTS is 10/10. Electronically Signed   By: Marin Roberts M.D.   On: 09/11/2020 20:00   CT HEAD CODE STROKE WO CONTRAST  Result Date: 09/10/2020 CLINICAL DATA:  Code stroke.  Acute neuro deficit EXAM: CT HEAD WITHOUT CONTRAST TECHNIQUE: Contiguous axial images were obtained from the base of the skull through the vertex without intravenous contrast. COMPARISON:  None. FINDINGS: Brain: Ventricle size normal. Mild atrophy. Chronic infarct right parietal lobe. Mild white matter hypodensity bilaterally most consistent with microvascular ischemia Vascular: Negative for hyperdense vessel Skull: Negative Sinuses/Orbits: Mucosal edema in the paranasal sinuses. Air-fluid levels in both maxillary sinus. Mastoid clear bilaterally. Other: None ASPECTS ([place] Stroke Program Early CT Score) - Ganglionic level infarction (caudate, lentiform nuclei, internal capsule, insula, M1-M3 cortex): 7 - Supraganglionic infarction (M4-M6 cortex): 3 Total score (0-10 with 10 being normal): 10 IMPRESSION: 1. No acute abnormality. 2. Chronic infarct right parietal lobe.  Microvascular ischemic change in the white matter 3. ASPECTS is 10 4. These results were called by telephone at the time of interpretation on 09/10/2020 at 4:07 pm to provider Effie Shy , who verbally acknowledged these results. Electronically Signed   By: Marlan Palau M.D.   On: 09/10/2020 16:08   CT ANGIO HEAD NECK W WO CM W PERF  Result Date: 09/11/2020 CLINICAL DATA:  Progressive left-sided weakness. EXAM: CT ANGIOGRAPHY HEAD AND  NECK CT PERFUSION BRAIN TECHNIQUE: Multidetector CT imaging of the head and neck was performed using the standard protocol during bolus administration of intravenous contrast. Multiplanar CT image reconstructions and MIPs were obtained to evaluate the vascular anatomy. Carotid stenosis measurements (when applicable) are obtained utilizing NASCET criteria, using the distal internal carotid diameter as the denominator. Multiphase CT imaging of the brain was performed following IV bolus contrast injection. Subsequent parametric perfusion maps were calculated using RAPID software. CONTRAST:  OMNIPAQUE IOHEXOL 350 MG/ML SOLN COMPARISON:  MR head without contrast, CT head without contrast, and CTA head and neck 09/10/2020. FINDINGS: CTA NECK FINDINGS Aortic arch: No significant interval change in the 3 vessel aortic arch with atherosclerotic calcifications. Right carotid system: The right common carotid artery is unremarkable. The right ICA is occluded at the bifurcation without significant reconstitution in the neck. Left carotid system: The left common carotid artery is within normal limits. Dense calcifications are present at the proximal ICA without a significant stenosis relative to the more distal vessel. There is mild tortuosity without significant stenosis in the cervical ICA. Vertebral arteries: Left vertebral artery is dominant vessel. Both vertebral arteries originate from the subclavian arteries without significant stenosis. No significant stenosis is present in either  vertebral artery in the neck. Skeleton: Multilevel degenerative changes are present. Anterior osteophytes are fused at C3-4. No significant interval change. Other neck: No acute abnormality or significant interval change. Upper chest: Lung apices are clear.  Thoracic inlet is normal. Review of the MIP images confirms the above findings CTA HEAD FINDINGS Anterior circulation: The right internal carotid artery remains occluded through the ophthalmic segment. Atherosclerotic changes are present the cavernous left ICA without significant stenosis through the ICA terminus. The left A1 segment and right A1 segment are similar the prior exam. Proximal right M1 segment narrowing has progressed. Is some irregularity in the left M1 segment without significant stenosis. There is significant loss of perfusion right MCA branch vessels compared to prior exam. Left MCA branch vessels remain intact. ACA vessels are unchanged. Posterior circulation: Moderate narrowing is present at the dural margin of the right vertebral artery, progressive from the prior exam. Left vertebral artery is unchanged. Vertebrobasilar junction normal. The basilar artery is small. Both posterior cerebral arteries originate from the basilar tip. Prominent right posterior communicating artery is present. There is segmental attenuation PCA branch vessels similar the prior study. Venous sinuses: Dural sinuses are patent. Straight sinus and deep cerebral veins are intact. Anatomic variants: None Review of the MIP images confirms the above findings CT Brain Perfusion Findings: ASPECTS: 10/10 CBF (<30%) Volume: 27mL Perfusion (Tmax>6.0s) volume: Mismatch Volume: Infarction Location:Right MCA territory IMPRESSION: 1. 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 2. 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 3. Progressive proximal right M1 segment stenosis with  significant loss of perfusion in the right MCA branch vessels. 4. Stable atherosclerotic changes at the left carotid bifurcation without significant stenosis. 5. 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 Basilar artery is similar the prior exam. 6.  Aortic Atherosclerosis (ICD10-I70.0). Electronically Signed   By: Marin Roberts M.D.   On: 09/11/2020 20:25    Labs:  CBC: Recent Labs    09/10/20 1540 09/10/20 1559 09/12/20 0025  WBC 4.4  --  4.3  HGB 12.2 12.6 12.0  HCT 38.3 37.0 37.4  PLT 141*  --  156    COAGS:  Recent Labs    09/10/20 1540  INR 1.0  APTT 26    BMP: Recent Labs    09/10/20 1540 09/10/20 1559  NA 141 143  K 3.3* 3.7  CL 106 105  CO2 26  --   GLUCOSE 88 84  BUN 18 20  CALCIUM 8.7*  --   CREATININE 0.71 0.90  GFRNONAA >60  --     LIVER FUNCTION TESTS: Recent Labs    09/10/20 1540  BILITOT 0.4  AST 17  ALT 8  ALKPHOS 67  PROT 7.1  ALBUMIN 3.6    Assessment and Plan:  CVA R ICA revascularization in IR last pm Doing well at this point- moving right well; left is slow Plan per Dr Wilford Corner  Electronically Signed: Robet Leu, PA-C 09/12/2020, 1:44 PM   I spent a total of 15 Minutes at the the patient's bedside AND on the patient's hospital floor or unit, greater than 50% of which was counseling/coordinating care for Rt ICA revascularization

## 2020-09-12 NOTE — Transfer of Care (Signed)
Immediate Anesthesia Transfer of Care Note  Patient: [name]  Procedure(s) Performed: IR WITH ANESTHESIA (N/A )  Patient Location: ICU  Anesthesia Type:General  Level of Consciousness: awake and confused  Airway & Oxygen Therapy: Patient Spontanous Breathing and Patient connected to nasal cannula oxygen  Post-op Assessment: Report given to RN and Post -op Vital signs reviewed and stable  Post vital signs: Reviewed and stable  Last Vitals:  Vitals Value Taken Time  BP    Temp    Pulse    Resp    SpO2      Last Pain:  Vitals:   09/11/20 1600  TempSrc: Oral  PainSc:       Patients Stated Pain Goal: 0 (09/11/20 0830)  Complications: No complications documented.

## 2020-09-12 NOTE — Progress Notes (Signed)
*  PRELIMINARY RESULTS* Echocardiogram 2D Echocardiogram has been performed.  Neomia Dear RDCS 09/12/2020, 1:43 PM

## 2020-09-12 NOTE — Progress Notes (Signed)
PROGRESS NOTE    [name]  ZOX:[id number] DOB: Mar 17, 1946 DOA: 09/10/2020 PCP: Patient, No Pcp Per (Inactive)    Brief Narrative:  75 y.o. female with medical history significant of HTN, not on any antihypertensive medications.  Patient reports she is from [place] and last PCP visit was more than a year ago and she is without medication for that duration.  She complains of having dizziness and unsteady gait for 2 weeks which has been worse in the morning today which brought her to the emergency room.  After she arrived in the ED she has developed left-sided weakness, left-sided facial droop and left neglect.  Stroke code was activated. Exact time of onset of symptoms unknown since patient has been having unsteady gait for few days, Patient also reports intermittent headache because of uncontrolled hypertension.  Patient symptoms has resolved within an hour.  Patient was seen by tele neurologist Dr. Iver NestleBhagat, recommended  stroke work-up, dual antiplatelet agents , aspirin and Plavix, statins.  Patient denies any recent travel, sick contacts, cough, weight loss, chest pain, shortness of breath.   ED Course: She was hemodynamically stable except hypertension.   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 At least partial reconstitution at the clinoid intracranially. Less than 50% stenosis at the left ICA origin.  Assessment & Plan:   Active Problems:   TIA (transient ischemic attack)  Dizziness /unsteady gait secondary to  acute and subacute R parietal infarct:  -MRI brain reviewed. Findings of R parietal small to moderate size acute and subacute infarct with few additional punctate acute to subacute R centrum semiovale infarcts noted  -CTA head and neck: Age indeterminate occlusion of right cervical ICA just beyond the origin.  Less than 50% stenosis of left ICA origin -Pt was initially given Aspirin 325 mg once followed by 81 mg daily and Plavix 300 mg once followed by 75 mg daily -Continue Lipitor 80 mg daily -Overnight events noted. On the evening of 5/6, patient was noted to have acute weakness on the L, prompting code stroke. Pt underwent emergent thrombectomy, since transferred to ICU -Today, pt seems stable, in ICU with Neuro recs to keep sbp<140 -PT/OT following  Hypertension: -No bp meds per home med rec -Had started low dose lisinopril 5/6 -Now on clevidipine for tight bp control  QT prolongation: -Initial EKG with QTc of 550 -Repeat EKG with qtc of 487 -Continue to replace lytes as needed  DVT prophylaxis: SCD's Code Status: Full Family Communication: Pt in room, family not at bedside  Status is: Inpatient  Remains inpatient appropriate because:Inpatient level of care appropriate due to severity of illness   Dispo: The patient is from: Home              Anticipated d/c is to: Home              Patient currently is not medically stable to d/c.   Difficult to place patient No   Consultants:   Neurology  IR  Procedures:   Emergent thrombectomy 5/6  Antimicrobials: Anti-infectives (From admission, onward)  None      Subjective: Without complaints  Objective: Vitals:   09/12/20 1300 09/12/20 1400 09/12/20 1500 09/12/20 1600  BP: (!) 119/48 (!) 133/54 (!) 119/41   Pulse: (!) 37 (!) 44 (!) 40   Resp: 14 17 16    Temp:    (!) 97.4 F (36.3 C)  TempSrc:    Axillary  SpO2: 100% 98% 96%   Weight:      Height:        Intake/Output Summary (Last 24 hours) at 09/12/2020  1711 Last data filed at 09/12/2020 1500 Gross per 24 hour  Intake 1000.28 ml  Output 500 ml  Net 500.28 ml   Filed Weights   09/10/20 1454  Weight: 95.3 kg    Examination: General exam: Conversant, in no acute distress Respiratory system: normal chest rise, clear, no audible wheezing Cardiovascular system: regular rhythm, s1-s2 Gastrointestinal system: Nondistended, nontender, pos BS Central nervous system: No seizures, no tremors Extremities: No cyanosis, no joint deformities Skin: No rashes, no pallor Psychiatry: Affect normal // no auditory hallucinations   Data Reviewed: I have personally reviewed following labs and imaging studies  CBC: Recent Labs  Lab 09/10/20 1540 09/10/20 1559 09/12/20 0025  WBC 4.4  --  4.3  NEUTROABS 1.9  --   --   HGB 12.2 12.6 12.0  HCT 38.3 37.0 37.4  MCV 90.5  --  89.9  PLT 141*  --  156   Basic Metabolic Panel: Recent Labs  Lab 09/10/20 1540 09/10/20 1559  NA 141 143  K 3.3* 3.7  CL 106 105  CO2 26  --   GLUCOSE 88 84  BUN 18 20  CREATININE 0.71 0.90  CALCIUM 8.7*  --    GFR: Estimated Creatinine Clearance: 66.5 mL/min (by C-G formula based on SCr of 0.9 mg/dL). Liver Function Tests: Recent Labs  Lab 09/10/20 1540  AST 17  ALT 8  ALKPHOS 67  BILITOT 0.4  PROT 7.1  ALBUMIN 3.6   No results for input(s): LIPASE, AMYLASE in the last 168 hours. No results for input(s): AMMONIA in the last 168 hours. Coagulation Profile: Recent Labs  Lab 09/10/20 1540  INR 1.0   Cardiac Enzymes: Recent Labs  Lab 09/12/20 0025  CKTOTAL 78   BNP (last 3 results) No results for input(s): PROBNP in the last 8760 hours. HbA1C: Recent Labs    09/11/20 1703  HGBA1C 5.3   CBG: Recent Labs  Lab 09/10/20 1544 09/11/20 1931  GLUCAP 87 107*   Lipid Profile: Recent Labs    09/12/20 0025  CHOL 230*  HDL 53  LDLCALC 160*  TRIG 83  CHOLHDL 4.3   Thyroid Function Tests: No results for input(s): TSH, T4TOTAL, FREET4,  T3FREE, THYROIDAB in the last 72 hours. Anemia Panel: No results for input(s): VITAMINB12, FOLATE, FERRITIN, TIBC, IRON, RETICCTPCT in the last 72 hours. Sepsis Labs: No results for input(s): PROCALCITON, LATICACIDVEN in the last 168 hours.  Recent Results (from the past 240 hour(s))  Resp Panel by RT-PCR (Flu A&B, Covid) Nasopharyngeal Swab     Status: None   Collection Time: 09/10/20  4:27 PM   Specimen: Nasopharyngeal Swab; Nasopharyngeal(NP) swabs in vial transport medium  Result Value Ref Range Status   SARS Coronavirus 2 by RT PCR NEGATIVE NEGATIVE Final    Comment: (NOTE) SARS-CoV-2 target nucleic acids are NOT DETECTED.  The SARS-CoV-2 RNA is generally detectable in upper respiratory specimens during the acute phase of infection. The lowest concentration of SARS-CoV-2 viral  copies this assay can detect is 138 copies/mL. A negative result does not preclude SARS-Cov-2 infection and should not be used as the sole basis for treatment or other patient management decisions. A negative result may occur with  improper specimen collection/handling, submission of specimen other than nasopharyngeal swab, presence of viral mutation(s) within the areas targeted by this assay, and inadequate number of viral copies(<138 copies/mL). A negative result must be combined with clinical observations, patient history, and epidemiological information. The expected result is Negative.  Fact Sheet for Patients:  BloggerCourse.com  Fact Sheet for Healthcare Providers:  SeriousBroker.it  This test is no t yet approved or cleared by the [place] FDA and  has been authorized for detection and/or diagnosis of SARS-CoV-2 by FDA under an Emergency Use Authorization (EUA). This EUA will remain  in effect (meaning this test can be used) for the duration of the COVID-19 declaration under Section 564(b)(1) of the Act, 21 U.S.C.section 360bbb-3(b)(1),  unless the authorization is terminated  or revoked sooner.       Influenza A by PCR NEGATIVE NEGATIVE Final   Influenza B by PCR NEGATIVE NEGATIVE Final    Comment: (NOTE) The Xpert Xpress SARS-CoV-2/FLU/RSV plus assay is intended as an aid in the diagnosis of influenza from Nasopharyngeal swab specimens and should not be used as a sole basis for treatment. Nasal washings and aspirates are unacceptable for Xpert Xpress SARS-CoV-2/FLU/RSV testing.  Fact Sheet for Patients: BloggerCourse.com  Fact Sheet for Healthcare Providers: SeriousBroker.it  This test is not yet approved or cleared by the [place] FDA and has been authorized for detection and/or diagnosis of SARS-CoV-2 by FDA under an Emergency Use Authorization (EUA). This EUA will remain in effect (meaning this test can be used) for the duration of the COVID-19 declaration under Section 564(b)(1) of the Act, 21 U.S.C. section 360bbb-3(b)(1), unless the authorization is terminated or revoked.  Performed at [facility], [address], [place], [place] [address]      Radiology Studies: MR BRAIN WO CONTRAST  Result Date: 09/10/2020 CLINICAL DATA:  ICA occlusion, code stroke follow-up EXAM: MRI HEAD WITHOUT CONTRAST TECHNIQUE: Multiplanar, multiecho pulse sequences of the brain and surrounding structures were obtained without intravenous contrast. COMPARISON:  Prior CT FINDINGS: Brain: There is diffusion hyperintensity in the right parietal lobe corresponding to infarct on CT. There is mixed ADC including hypointensity. Few additional punctate foci of mildly reduced diffusion in the right centrum semiovale. There is no evidence of intracranial hemorrhage. There is no intracranial mass or significant mass effect. There is no hydrocephalus or extra-axial fluid collection. Prominence of the ventricles and sulci reflects generalized parenchymal volume loss.  Other patchy foci of T2 hyperintensity in the supratentorial white matter are nonspecific but may reflect mild chronic microvascular ischemic changes. Vascular: Loss of the flow void of the visualized cervical right ICA and proximal intracranial portion corresponding to findings on CTA. Skull and upper cervical spine: Normal marrow signal is preserved. Sinuses/Orbits: Paranasal sinus mucosal thickening with maxillary sinus air-fluid levels. Orbits are unremarkable. Other: Sella is unremarkable.  Mastoid air cells are clear. 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 No evidence of hemorrhage. Electronically Signed   By: Guadlupe Spanish M.D.   On: 09/10/2020 19:08   CT CEREBRAL PERFUSION W CONTRAST  Result Date: 09/11/2020 CLINICAL DATA:  Progressive left-sided weakness. EXAM: CT ANGIOGRAPHY HEAD AND NECK CT PERFUSION  BRAIN TECHNIQUE: Multidetector CT imaging of the head and neck was performed using the standard protocol during bolus administration of intravenous contrast. Multiplanar CT image reconstructions and MIPs were obtained to evaluate the vascular anatomy. Carotid stenosis measurements (when applicable) are obtained utilizing NASCET criteria, using the distal internal carotid diameter as the denominator. Multiphase CT imaging of the brain was performed following IV bolus contrast injection. Subsequent parametric perfusion maps were calculated using RAPID software. CONTRAST:  OMNIPAQUE IOHEXOL 350 MG/ML SOLN COMPARISON:  MR head without contrast, CT head without contrast, and CTA head and neck 09/10/2020. FINDINGS: CTA NECK FINDINGS Aortic arch: No significant interval change in the 3 vessel aortic arch with atherosclerotic calcifications. Right carotid system: The right common carotid artery is unremarkable. The right ICA is occluded at the bifurcation without significant  reconstitution in the neck. Left carotid system: The left common carotid artery is within normal limits. Dense calcifications are present at the proximal ICA without a significant stenosis relative to the more distal vessel. There is mild tortuosity without significant stenosis in the cervical ICA. Vertebral arteries: Left vertebral artery is dominant vessel. Both vertebral arteries originate from the subclavian arteries without significant stenosis. No significant stenosis is present in either vertebral artery in the neck. Skeleton: Multilevel degenerative changes are present. Anterior osteophytes are fused at C3-4. No significant interval change. Other neck: No acute abnormality or significant interval change. Upper chest: Lung apices are clear.  Thoracic inlet is normal. Review of the MIP images confirms the above findings CTA HEAD FINDINGS Anterior circulation: The right internal carotid artery remains occluded through the ophthalmic segment. Atherosclerotic changes are present the cavernous left ICA without significant stenosis through the ICA terminus. The left A1 segment and right A1 segment are similar the prior exam. Proximal right M1 segment narrowing has progressed. Is some irregularity in the left M1 segment without significant stenosis. There is significant loss of perfusion right MCA branch vessels compared to prior exam. Left MCA branch vessels remain intact. ACA vessels are unchanged. Posterior circulation: Moderate narrowing is present at the dural margin of the right vertebral artery, progressive from the prior exam. Left vertebral artery is unchanged. Vertebrobasilar junction normal. The basilar artery is small. Both posterior cerebral arteries originate from the basilar tip. Prominent right posterior communicating artery is present. There is segmental attenuation PCA branch vessels similar the prior study. Venous sinuses: Dural sinuses are patent. Straight sinus and deep cerebral veins are  intact. Anatomic variants: None Review of the MIP images confirms the above findings CT Brain Perfusion Findings: ASPECTS: 10/10 CBF (<30%) Volume: 64mL Perfusion (Tmax>6.0s) volume: Mismatch Volume: Infarction Location:Right MCA territory IMPRESSION: 1. 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 2. 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 3. Progressive proximal right M1 segment stenosis with significant loss of perfusion in the right MCA branch vessels. 4. Stable atherosclerotic changes at the left carotid bifurcation without significant stenosis. 5. 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 Basilar artery is similar the prior exam. 6.  Aortic Atherosclerosis (ICD10-I70.0). Electronically Signed   By: Marin Roberts M.D.   On: 09/11/2020 20:25   ECHOCARDIOGRAM COMPLETE  Result Date: 09/12/2020    ECHOCARDIOGRAM REPORT   Patient Name:   BLISS BEHNKE Date of Exam: 09/12/2020 Medical Rec #:  [id number]   Height:       70.0 in Accession #:    [id number]  Weight:       210.0 lb Date of Birth:  [birth year]  BSA:          2.131 m Patient Age:    74 years    BP:           99/45 mmHg Patient Gender: F           HR:           36 bpm. Exam Location:  Inpatient Procedure: 2D Echo, Cardiac Doppler and Color Doppler Indications:    TIA  History:        Patient has no prior history of Echocardiogram examinations.                 CHF, COPD; Risk Factors:Hypertension and Diabetes.  Sonographer:    Neomia Dear RDCS Referring Phys: [id number] PARDEEP BJYNW  Sonographer Comments: No cardiac surgery noted in chart. IMPRESSIONS  1. Left ventricular ejection fraction, by estimation, is 60 to 65%. The left ventricle has normal function. The left ventricle has no regional wall motion abnormalities. There is mild left ventricular hypertrophy. Left ventricular diastolic parameters are  consistent with Grade I diastolic dysfunction (impaired relaxation).  2. Right ventricular systolic function is normal. The right ventricular size is normal. Tricuspid regurgitation signal is inadequate for assessing PA pressure.  3. Left atrial size was moderately dilated.  4. Right atrial size was mildly dilated.  5. The mitral valve is normal in structure. Trivial mitral valve regurgitation. No evidence of mitral stenosis.  6. The aortic valve is grossly normal. There is mild calcification of the aortic valve. Aortic valve regurgitation is not visualized. No aortic stenosis is present.  7. Aortic dilatation noted. There is borderline dilatation of the ascending aorta, measuring 39 mm.  8. The inferior vena cava is normal in size with greater than 50% respiratory variability, suggesting right atrial pressure of 3 mmHg. Conclusion(s)/Recommendation(s): No intracardiac source of embolism detected on this transthoracic study. A transesophageal echocardiogram is recommended to exclude cardiac source of embolism if clinically indicated. FINDINGS  Left Ventricle: Left ventricular ejection fraction, by estimation, is 60 to 65%. The left ventricle has normal function. The left ventricle has no regional wall motion abnormalities. The left ventricular internal cavity size was normal in size. There is  mild left ventricular hypertrophy. Left ventricular diastolic parameters are consistent with Grade I diastolic dysfunction (impaired relaxation). Right Ventricle: The right ventricular size is normal. No increase in right ventricular wall thickness. Right ventricular systolic function is normal. Tricuspid regurgitation signal is inadequate for assessing PA pressure. Left Atrium: Left atrial size was moderately dilated. Right Atrium: Right atrial size was mildly dilated. Pericardium: There is no evidence of pericardial effusion. Mitral Valve: The mitral valve is normal in structure. Trivial mitral valve regurgitation. No  evidence of mitral valve stenosis. Tricuspid Valve: The tricuspid valve is normal in structure. Tricuspid valve regurgitation is not demonstrated. No evidence of tricuspid stenosis. Aortic Valve: The aortic valve is grossly normal. There is mild calcification of the aortic valve. Aortic valve regurgitation is not visualized. No aortic stenosis is present. Aortic valve mean gradient measures 4.0 mmHg. Aortic valve peak gradient measures 9.5 mmHg. Aortic valve area, by VTI measures 3.12 cm. Pulmonic Valve: The pulmonic valve was normal in structure. Pulmonic valve regurgitation is trivial. No evidence of pulmonic stenosis. Aorta: Aortic dilatation noted. There is borderline dilatation of the ascending aorta, measuring 39 mm. Venous: The inferior vena cava is normal in size with greater than 50% respiratory  variability, suggesting right atrial pressure of 3 mmHg. IAS/Shunts: No atrial level shunt detected by color flow Doppler. EKG: Rhythm strip during this exam demostrated sinus bradycardia.  LEFT VENTRICLE PLAX 2D LVIDd:         5.20 cm      Diastology LV PW:         1.20 cm      LV e' medial:    5.11 cm/s LV IVS:        1.30 cm      LV E/e' medial:  15.2 LVOT diam:     2.30 cm      LV e' lateral:   6.57 cm/s LV SV:         115          LV E/e' lateral: 11.9 LV SV Index:   54 LVOT Area:     4.15 cm  LV Volumes (MOD) LV vol d, MOD A2C: 63.5 ml LV vol d, MOD A4C: 130.0 ml LV vol s, MOD A2C: 27.5 ml LV vol s, MOD A4C: 27.9 ml LV SV MOD A2C:     36.0 ml LV SV MOD A4C:     130.0 ml LV SV MOD BP:      68.8 ml RIGHT VENTRICLE RV S prime:     16.30 cm/s  PULMONARY VEINS TAPSE (M-mode): 3.0 cm      A Reversal Duration: 113.00 msec                             A Reversal Velocity: 32.80 cm/s                             Diastolic Velocity:  39.40 cm/s                             S/D Velocity:        1.40                             Systolic Velocity:   55.00 cm/s LEFT ATRIUM             Index LA diam:        4.30 cm 2.02 cm/m  LA Vol (A2C):   95.6 ml 44.86 ml/m LA Vol (A4C):   80.7 ml 37.87 ml/m LA Biplane Vol: 90.0 ml 42.23 ml/m  AORTIC VALVE                   PULMONIC VALVE AV Area (Vmax):    3.26 cm    PV Vmax:       1.03 m/s AV Area (Vmean):   3.10 cm    PV Vmean:      68.500 cm/s AV Area (VTI):     3.12 cm    PV VTI:        0.305 m AV Vmax:           154.00 cm/s PV Peak grad:  4.2 mmHg AV Vmean:          94.300 cm/s PV Mean grad:  2.0 mmHg AV VTI:            0.367 m AV Peak Grad:      9.5 mmHg AV Mean Grad:      4.0 mmHg LVOT Vmax:  121.00 cm/s LVOT Vmean:        70.300 cm/s LVOT VTI:          0.276 m LVOT/AV VTI ratio: 0.75  AORTA Ao Root diam: 3.40 cm Ao Asc diam:  3.90 cm MITRAL VALVE MV Area (PHT): 2.48 cm    SHUNTS MV Decel Time: 306 msec    Systemic VTI:  0.28 m MV E velocity: 77.90 cm/s  Systemic Diam: 2.30 cm MV A velocity: 94.00 cm/s MV E/A ratio:  0.83 Weston Brass MD Electronically signed by Weston Brass MD Signature Date/Time: 09/12/2020/4:17:21 PM    Final    CT HEAD CODE STROKE WO CONTRAST  Result Date: 09/11/2020 CLINICAL DATA:  Code stroke. Neuro deficit. Acute stroke suspected. Dizziness and unsteady gait for 2 weeks. EXAM: CT HEAD WITHOUT CONTRAST TECHNIQUE: Contiguous axial images were obtained from the base of the skull through the vertex without intravenous contrast. COMPARISON:  CT head without contrast and CTA head and neck 09/10/2020. FINDINGS: Brain: Right parietal acute/subacute infarct stable. No new infarct present. Basal ganglia are intact. Insular ribbon normal. The ventricles are of normal size. No significant extraaxial fluid collection is present. Vascular: Atherosclerotic calcifications are present within the cavernous internal carotid arteries bilaterally. No hyperdense vessel is present. Skull: Calvarium is intact. No focal lytic or blastic lesions are present. Sinuses/Orbits: Bilateral maxillary sinus fluid collections are present. Scattered ethmoid opacification noted.  Sphenoid sinuses are clear. Frontal sinuses demonstrate mild mucosal thickening. Mastoid air cells are clear. The globes and orbits are within normal limits. ASPECTS [facility] Stroke Program Early CT Score) - Ganglionic level infarction (caudate, lentiform nuclei, internal capsule, insula, M1-M3 cortex): 7/7 - Supraganglionic infarction (M4-M6 cortex): 3/3 Total score (0-10 with 10 being normal): 10/10 IMPRESSION: 1. Stable appearance of right parietal acute/subacute infarct. 2. No new infarct. 3. ASPECTS is 10/10. Electronically Signed   By: Marin Roberts M.D.   On: 09/11/2020 20:00   CT ANGIO HEAD NECK W WO CM W PERF  Result Date: 09/11/2020 CLINICAL DATA:  Progressive left-sided weakness. EXAM: CT ANGIOGRAPHY HEAD AND NECK CT PERFUSION BRAIN TECHNIQUE: Multidetector CT imaging of the head and neck was performed using the standard protocol during bolus administration of intravenous contrast. Multiplanar CT image reconstructions and MIPs were obtained to evaluate the vascular anatomy. Carotid stenosis measurements (when applicable) are obtained utilizing NASCET criteria, using the distal internal carotid diameter as the denominator. Multiphase CT imaging of the brain was performed following IV bolus contrast injection. Subsequent parametric perfusion maps were calculated using RAPID software. CONTRAST:  OMNIPAQUE IOHEXOL 350 MG/ML SOLN COMPARISON:  MR head without contrast, CT head without contrast, and CTA head and neck 09/10/2020. FINDINGS: CTA NECK FINDINGS Aortic arch: No significant interval change in the 3 vessel aortic arch with atherosclerotic calcifications. Right carotid system: The right common carotid artery is unremarkable. The right ICA is occluded at the bifurcation without significant reconstitution in the neck. Left carotid system: The left common carotid artery is within normal limits. Dense calcifications are present at the proximal ICA without a significant stenosis relative to  the more distal vessel. There is mild tortuosity without significant stenosis in the cervical ICA. Vertebral arteries: Left vertebral artery is dominant vessel. Both vertebral arteries originate from the subclavian arteries without significant stenosis. No significant stenosis is present in either vertebral artery in the neck. Skeleton: Multilevel degenerative changes are present. Anterior osteophytes are fused at C3-4. No significant interval change. Other neck: No acute abnormality or significant interval change.  Upper chest: Lung apices are clear.  Thoracic inlet is normal. Review of the MIP images confirms the above findings CTA HEAD FINDINGS Anterior circulation: The right internal carotid artery remains occluded through the ophthalmic segment. Atherosclerotic changes are present the cavernous left ICA without significant stenosis through the ICA terminus. The left A1 segment and right A1 segment are similar the prior exam. Proximal right M1 segment narrowing has progressed. Is some irregularity in the left M1 segment without significant stenosis. There is significant loss of perfusion right MCA branch vessels compared to prior exam. Left MCA branch vessels remain intact. ACA vessels are unchanged. Posterior circulation: Moderate narrowing is present at the dural margin of the right vertebral artery, progressive from the prior exam. Left vertebral artery is unchanged. Vertebrobasilar junction normal. The basilar artery is small. Both posterior cerebral arteries originate from the basilar tip. Prominent right posterior communicating artery is present. There is segmental attenuation PCA branch vessels similar the prior study. Venous sinuses: Dural sinuses are patent. Straight sinus and deep cerebral veins are intact. Anatomic variants: None Review of the MIP images confirms the above findings CT Brain Perfusion Findings: ASPECTS: 10/10 CBF (<30%) Volume: 0mL Perfusion (Tmax>6.0s) volume: Mismatch Volume:  Infarction Location:Right MCA territory IMPRESSION: 1. 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 2. 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 3. Progressive proximal right M1 segment stenosis with significant loss of perfusion in the right MCA branch vessels. 4. Stable atherosclerotic changes at the left carotid bifurcation without significant stenosis. 5. 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 Basilar artery is similar the prior exam. 6.  Aortic Atherosclerosis (ICD10-I70.0). Electronically Signed   By: Marin Roberts M.D.   On: 09/11/2020 20:25    Scheduled Meds: . aspirin EC  81 mg Oral Daily  . atorvastatin  80 mg Oral QHS  . Chlorhexidine Gluconate Cloth  6 each Topical Daily  . clopidogrel  75 mg Oral Daily  . lisinopril  10 mg Oral Daily   Continuous Infusions: . sodium chloride Stopped (09/11/20 0900)  . sodium chloride    . clevidipine 2 mg/hr (09/12/20 1500)     LOS: 2 days   Rickey Barbara, MD [facility] Pager On Amion  If 7PM-7AM, please contact night-coverage 09/12/2020, 5:11 PM

## 2020-09-12 NOTE — Evaluation (Signed)
Occupational Therapy Evaluation Patient Details Name: [name] MRN: [id number] DOB: Nov 17, 1945 Today's Date: 09/12/2020    History of Present Illness 75 yo female presents to [facility] on 5/5 with 2-3 week history of dizziness, unsteadiness. 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 Rapid response called 5/6 due to new onset L sided weakness, pt taken to IR for thrombectomy of R ICA/MCA occlusions. PMH significant for hypertension, COPD, aortic aneurysm, DVT in pregnancy, former smoking, nonadherence to medications for the past year, remote MI.   Clinical Impression   Pt PTA: Pt living with children and independent. Pt currently, appears HOH and often needs directions repeated. Pt currently with deficits in multi step commands,  problem solving, slow to respond and initiate. +2 hand held assist up to [facility], pt abruptly sat EOB and wanting to lie down prior to OT/PT ready for pt to sit. Patient limited by L-sided weakness, L inattention and decreased initiation with cognitive deficits. Pt set-upA to maxA for ADL and ModA+2 for mobility.Low BP at rest: 95/68 and 127/69 at EOB; brady at rest 45 BPM, 67 BPM with exertion. O2 on 2L >90% throughout tasks. Pt would greatly benefit from continued OT skilled services. OT following acutely.    Follow Up Recommendations  CIR    Equipment Recommendations  3 in 1 bedside commode    Recommendations for Other Services Rehab consult     Precautions / Restrictions Precautions Precautions: Fall Precaution Comments: L inattention Restrictions Weight Bearing Restrictions: No      Mobility Bed Mobility Overal bed mobility: Needs Assistance Bed Mobility: Supine to Sit     Supine to sit: Mod assist;HOB elevated     General bed mobility comments:  Assist for trunk and to continue bringing BLEs over to EOB.    Transfers Overall transfer level: Needs assistance Equipment used: 2 person hand held assist Transfers: Sit to/from Stand;Lateral/Scoot Transfers Sit to Stand: Mod assist;+2 physical assistance;From elevated surface        Lateral/Scoot Transfers: Min guard General transfer comment: ModA +2 for taking steps to [facility], pt abruptly sitting down and then asked to take more steps to [facility] with ModA +2. Pt laterally scooting to HOB, but moving very little without assist.    Balance Overall balance assessment: Needs assistance Sitting-balance support: Bilateral upper extremity supported;Feet supported Sitting balance-Leahy Scale: Fair     Standing balance support: Bilateral upper extremity supported Standing balance-Leahy Scale: Poor Standing balance comment: +2 modA for stance                           ADL either performed or assessed with clinical judgement   ADL Overall ADL's : Needs assistance/impaired Eating/Feeding: NPO   Grooming: Minimal assistance;Sitting   Upper Body Bathing: Minimal assistance;Sitting   Lower Body Bathing: Maximal assistance;+2 for physical assistance;+2 for safety/equipment;Cueing for safety;Cueing for sequencing;Sitting/lateral leans;Sit to/from stand   Upper Body Dressing : Minimal assistance;Sitting   Lower Body Dressing: Maximal assistance;+2 for physical assistance;+2 for safety/equipment;Cueing for safety Lower Body Dressing Details (indicate cue type and reason): attempting to don/dof socks, but requiring assist for figure 4 to stay as BLEs kept moving instead of stayin due to gravity . Toilet Transfer: Moderate assistance;+2 for physical assistance;+2 for safety/equipment;Stand-pivot Toilet Transfer Details (indicate cue type and  reason): +2 hand held assist up to [facility], pt abruptly sat EOB and wanting to lie down prior to OT/PT ready for pt to sit. Toileting- Clothing  Manipulation and Hygiene: Moderate assistance;+2 for physical assistance;+2 for safety/equipment;Sit to/from stand       Functional mobility during ADLs: Moderate assistance;+2 for physical assistance;Cueing for safety;Cueing for sequencing General ADL Comments: Patient limited by L-sided weakness, L inattention and decreased initiation with cognitive deficits.     Vision Baseline Vision/History: Wears glasses Wears Glasses: Reading only Patient Visual Report: Peripheral vision impairment Vision Assessment?: Yes;Vision impaired- to be further tested in functional context Eye Alignment: Within Functional Limits Ocular Range of Motion: Within Functional Limits Additional Comments: scanning, WFLs; unable to see peripheral vision on L side unless pt turning head     Perception     Praxis      Pertinent Vitals/Pain Pain Assessment: No/denies pain     Hand Dominance Right   Extremity/Trunk Assessment Upper Extremity Assessment Upper Extremity Assessment: Generalized weakness;LUE deficits/detail;RUE deficits/detail RUE Deficits / Details: strength 5/5, AROM is WNLs LUE Deficits / Details: AROM WFL with increased time. MMT grossly 4/5. LUE Coordination: decreased fine motor;decreased gross motor   Lower Extremity Assessment Lower Extremity Assessment: Defer to PT evaluation;Generalized weakness   Cervical / Trunk Assessment Cervical / Trunk Assessment: Normal   Communication Communication Communication: HOH   Cognition Arousal/Alertness: Lethargic Behavior During Therapy: Flat affect Overall Cognitive Status: No family/caregiver present to determine baseline cognitive functioning Area of Impairment: Attention;Memory;Following commands;Safety/judgement;Problem solving;Awareness                   Current Attention Level: Selective Memory: Decreased short-term memory Following Commands: Follows one step commands with increased time Safety/Judgement: Decreased awareness  of deficits;Decreased awareness of safety Awareness: Emergent Problem Solving: Requires verbal cues;Requires tactile cues;Slow processing;Decreased initiation General Comments: Pt appears HOH and often needs directions repeated. Pt currently with deficits in multi step commands and problem solving. Pt slow to respon and initiate.   General Comments  Low BP at rest: 95/68 and 127/69 at EOB; brady at rest 45 BPM, 67 BPM with exertion. O2 on 2L >90% throughout tasks.    Exercises     Shoulder Instructions      Home Living Family/patient expects to be discharged to:: Private residence Living Arrangements: Children Available Help at Discharge: Family;Available 24 hours/day Type of Home: Apartment Home Access: Stairs to enter Entrance Stairs-Number of Steps: 2 Entrance Stairs-Rails: None Home Layout: Two level;Bed/bath upstairs Alternate Level Stairs-Number of Steps: flight Alternate Level Stairs-Rails: Left Bathroom Shower/Tub: Chief Strategy Officer: Standard     Home Equipment: None          Prior Functioning/Environment Level of Independence: Independent        Comments: Independent with ADLs/IADLs. Does not drive at baseline.        OT Problem List: Decreased strength;Impaired balance (sitting and/or standing);Impaired vision/perception;Decreased coordination;Decreased safety awareness;Impaired UE functional use;Decreased cognition;Cardiopulmonary status limiting activity      OT Treatment/Interventions: Self-care/ADL training;Therapeutic exercise;Neuromuscular education;Energy conservation;Therapeutic activities;Cognitive remediation/compensation;Visual/perceptual remediation/compensation;Patient/family education;Balance training    OT Goals(Current goals can be found in the care plan section) Acute Rehab OT Goals Patient Stated Goal: to sleep OT Goal Formulation: With patient Time For Goal Achievement: 09/26/20 Potential to Achieve Goals: Good ADL  Goals Pt Will Perform Grooming: standing;with min guard assist Pt Will Perform Upper Body Dressing: with min guard assist;standing Pt Will Perform Lower Body Dressing: with min assist;sit to/from stand Pt  Will Transfer to Toilet: with min guard assist;ambulating Pt Will Perform Toileting - Clothing Manipulation and hygiene: with min assist;sit to/from stand Additional ADL Goal #1: Pt will recall and demo 3 compensatory strategies for L inattention. Additional ADL Goal #2: Pt will demo LUE NMR HEP with use of written handout and minA  OT Frequency: Min 2X/week   Barriers to D/C:            Co-evaluation              AM-PAC OT "6 Clicks" Daily Activity     Outcome Measure Help from another person eating meals?: Total Help from another person taking care of personal grooming?: A Little Help from another person toileting, which includes using toliet, bedpan, or urinal?: A Lot Help from another person bathing (including washing, rinsing, drying)?: A Lot Help from another person to put on and taking off regular upper body clothing?: A Little Help from another person to put on and taking off regular lower body clothing?: A Lot 6 Click Score: 13   End of Session Equipment Utilized During Treatment: Gait belt;Oxygen Nurse Communication: Mobility status  Activity Tolerance:   Patient left: in bed;with call bell/phone within reach;with bed alarm set  OT Visit Diagnosis: Unsteadiness on feet (R26.81);Muscle weakness (generalized) (M62.81);Low vision, both eyes (H54.2);Other symptoms and signs involving cognitive function                Time: 1419-1450 OT Time Calculation (min): 31 min Charges:  OT General Charges $OT Visit: 1 Visit OT Evaluation $OT Eval Moderate Complexity: 1 Mod  Flora Lipps, OTR/L Acute Rehabilitation Services Pager: [phone] Office: [phone]    Lonzo Cloud 09/12/2020, 5:32 PM

## 2020-09-12 NOTE — Progress Notes (Signed)
STROKE PROGRESS  [name] is an 75 y.o. female.   ASSESSMENT/PLAN: 1.  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  Status post thrombectomy at both the ICA and MCA levels with good recanalization.  Risk factors hypertension and age.  Repeat imaging as per protocol.   Dual antiplatelet agents are recommended for 3 months.  A statin is also recommended along with blood pressure control.   History appears to be doing well at this time status post thrombectomy.  The daughter is at the bedside.  The case is discussed with the family.   GENERAL: She is resting well but responsive.  HEENT: Neck is supple no trauma noted.  ABDOMEN: soft  EXTREMITIES: No edema   BACK: Normal  SKIN: Normal by inspection.    MENTAL STATUS: She is drowsy but easily arousable to verbal commands.  She is oriented to hospital, year and medical condition.  She follows commands well.  CRANIAL NERVES: Pupils are equal, round and reactive to light and accomodation; extra ocular movements are full, there is no significant nystagmus; visual fields are full; upper and lower facial muscles are normal in strength and symmetric, there is no flattening of the nasolabial folds; tongue is midline; uvula is midline; shoulder elevation is normal.  MOTOR: She has 4/5 strength throughout both upper lower extremities.  COORDINATION: Left finger to nose is normal, right finger to nose is normal, No rest tremor; no intention tremor; no postural tremor; no bradykinesia.  SENSATION: Normal to light touch, temperature, and pain.     Blood pressure (!) 119/48, pulse (!) 37, temperature (!) 97.4 F (36.3 C), temperature source Axillary, resp. rate 14, height 5\' 10"  (1.778 m), weight 95.3 kg, SpO2 100 %.  Past Medical History:  Diagnosis Date  . COPD (chronic obstructive pulmonary disease) (HCC)   . History of DVT (deep vein thrombosis)   .  Hypertension   . 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 IR PERCUTANEOUS ART THROMBECTOMY/INFUSION INTRACRANIAL INC DIAG ANGIO  09/11/2020      . NO PAST SURGERIES      History reviewed. No pertinent family history.  Social History:  reports that she quit smoking about 33 years ago. She has never used smokeless tobacco. She reports that she does not drink alcohol and does not use drugs.  Allergies: No Known Allergies  Medications: Prior to Admission medications   Medication Sig Start Date End Date Taking? Authorizing Provider  ibuprofen (ADVIL) 200 MG tablet Take 200 mg by mouth every 6 (six) hours as needed for headache.   Yes [provider]    Scheduled Meds: . aspirin EC  81 mg Oral Daily  . atorvastatin  80 mg Oral QHS  . Chlorhexidine Gluconate Cloth  6 each Topical Daily  . clopidogrel  75 mg Oral Daily  . lisinopril  10 mg Oral Daily   Continuous Infusions: . sodium chloride Stopped (09/11/20 0900)  . sodium chloride    . clevidipine 7 mg/hr (09/12/20 0700)   PRN Meds:.acetaminophen **OR** acetaminophen (TYLENOL) oral liquid 160 mg/5 mL **OR** acetaminophen, diphenhydrAMINE, hydrALAZINE, iohexol, senna-docusate     Results for orders placed or performed during the hospital encounter of 09/10/20 (from the past 48 hour(s))  Ethanol     Status: None   Collection Time: 09/10/20  3:40 PM  Result Value Ref Range   Alcohol, Ethyl (B) <10 <  10 mg/dL    Comment: (NOTE) Lowest detectable limit for serum alcohol is 10 mg/dL.  For medical purposes only. Performed at [facility], [address] [place], [place] [address]   Protime-INR     Status: None   Collection Time: 09/10/20  3:40 PM  Result Value Ref Range   Prothrombin Time 13.1 11.4 - 15.2 seconds   INR 1.0 0.8 - 1.2    Comment: (NOTE) INR goal varies based on device and disease states. Performed at [facility], [address] [place], [place] [address]   APTT     Status: None   Collection Time: 09/10/20  3:40 PM  Result Value Ref Range   aPTT 26 24 - 36 seconds    Comment: Performed at [facility], [address] [place], [place] [address]  CBC     Status: Abnormal   Collection Time: 09/10/20  3:40 PM  Result Value Ref Range   WBC 4.4 4.0 - 10.5 K/uL   RBC 4.23 3.87 - 5.11 MIL/uL   Hemoglobin 12.2 12.0 - 15.0 g/dL   HCT 91.438.3 78.236.0 - 95.646.0 %   MCV 90.5 80.0 - 100.0 fL   MCH 28.8 26.0 - 34.0 pg   MCHC 31.9 30.0 - 36.0 g/dL   RDW 21.313.1 08.611.5 - 57.815.5 %   Platelets 141 (L) 150 - 400 K/uL   nRBC 0.0 0.0 - 0.2 %    Comment: Performed at [facility], [address] [place], [place] [address]  Differential     Status: None   Collection Time: 09/10/20  3:40 PM  Result Value Ref Range   Neutrophils Relative % 43 %   Neutro Abs 1.9 1.7 - 7.7 K/uL   Lymphocytes Relative 36 %   Lymphs Abs 1.6 0.7 - 4.0 K/uL   Monocytes Relative 12 %   Monocytes Absolute 0.5 0.1 - 1.0 K/uL   Eosinophils Relative 8 %   Eosinophils Absolute 0.3 0.0 - 0.5 K/uL   Basophils Relative 1 %   Basophils Absolute 0.0 0.0 - 0.1 K/uL   Immature Granulocytes 0 %   Abs Immature Granulocytes 0.01 0.00 - 0.07 K/uL    Comment: Performed at [facility], [address] [place], [place] [address]  Comprehensive metabolic panel     Status: Abnormal   Collection Time: 09/10/20  3:40 PM  Result Value Ref Range   Sodium 141 135 - 145 mmol/L   Potassium 3.3 (L) 3.5 - 5.1 mmol/L   Chloride 106 98 - 111 mmol/L   CO2 26 22 - 32 mmol/L   Glucose, Bld 88 70 - 99 mg/dL    Comment: Glucose reference range applies only to samples taken after fasting for at least 8 hours.   BUN 18 8 - 23 mg/dL   Creatinine, Ser 1.320.71 0.44 - 1.00 mg/dL   Calcium 8.7 (L) 8.9 - 10.3 mg/dL   Total Protein 7.1 6.5 - 8.1 g/dL   Albumin 3.6 3.5 - 5.0 g/dL   AST 17 15 - 41 U/L   ALT 8 0 - 44  U/L   Alkaline Phosphatase 67 38 - 126 U/L   Total Bilirubin 0.4 0.3 - 1.2 mg/dL   GFR, Estimated >44>60 >01>60 mL/min    Comment: (NOTE) Calculated using the CKD-EPI Creatinine Equation (2021)    Anion gap 9 5 - 15    Comment: Performed at [facility], [address]  [place], [place] [address]  CBG monitoring, ED     Status: None   Collection Time: 09/10/20  3:44 PM  Result Value Ref Range   Glucose-Capillary 87 70 - 99 mg/dL    Comment: Glucose reference range applies only to samples taken after fasting for at least 8 hours.  I-stat chem 8, ED     Status: None   Collection Time: 09/10/20  3:59 PM  Result Value Ref Range   Sodium 143 135 - 145 mmol/L   Potassium 3.7 3.5 - 5.1 mmol/L   Chloride 105 98 - 111 mmol/L   BUN 20 8 - 23 mg/dL   Creatinine, Ser 0.17 0.44 - 1.00 mg/dL   Glucose, Bld 84 70 - 99 mg/dL    Comment: Glucose reference range applies only to samples taken after fasting for at least 8 hours.   Calcium, Ion 1.16 1.15 - 1.40 mmol/L   TCO2 28 22 - 32 mmol/L   Hemoglobin 12.6 12.0 - 15.0 g/dL   HCT 51.0 25.8 - 52.7 %  Resp Panel by RT-PCR (Flu A&B, Covid) Nasopharyngeal Swab     Status: None   Collection Time: 09/10/20  4:27 PM   Specimen: Nasopharyngeal Swab; Nasopharyngeal(NP) swabs in vial transport medium  Result Value Ref Range   SARS Coronavirus 2 by RT PCR NEGATIVE NEGATIVE    Comment: (NOTE) SARS-CoV-2 target nucleic acids are NOT DETECTED.  The SARS-CoV-2 RNA is generally detectable in upper respiratory specimens during the acute phase of infection. The lowest concentration of SARS-CoV-2 viral copies this assay can detect is 138 copies/mL. A negative result does not preclude SARS-Cov-2 infection and should not be used as the sole basis for treatment or other patient management decisions. A negative result may occur with  improper specimen collection/handling, submission of specimen other than nasopharyngeal swab, presence of viral  mutation(s) within the areas targeted by this assay, and inadequate number of viral copies(<138 copies/mL). A negative result must be combined with clinical observations, patient history, and epidemiological information. The expected result is Negative.  Fact Sheet for Patients:  BloggerCourse.com  Fact Sheet for Healthcare Providers:  SeriousBroker.it  This test is no t yet approved or cleared by the [place] FDA and  has been authorized for detection and/or diagnosis of SARS-CoV-2 by FDA under an Emergency Use Authorization (EUA). This EUA will remain  in effect (meaning this test can be used) for the duration of the COVID-19 declaration under Section 564(b)(1) of the Act, 21 U.S.C.section 360bbb-3(b)(1), unless the authorization is terminated  or revoked sooner.       Influenza A by PCR NEGATIVE NEGATIVE   Influenza B by PCR NEGATIVE NEGATIVE    Comment: (NOTE) The Xpert Xpress SARS-CoV-2/FLU/RSV plus assay is intended as an aid in the diagnosis of influenza from Nasopharyngeal swab specimens and should not be used as a sole basis for treatment. Nasal washings and aspirates are unacceptable for Xpert Xpress SARS-CoV-2/FLU/RSV testing.  Fact Sheet for Patients: BloggerCourse.com  Fact Sheet for Healthcare Providers: SeriousBroker.it  This test is not yet approved or cleared by the [place] FDA and has been authorized for detection and/or diagnosis of SARS-CoV-2 by FDA under an Emergency Use Authorization (EUA). This EUA will remain in effect (meaning this test can be used) for the duration of the COVID-19 declaration under Section 564(b)(1) of the Act, 21 U.S.C. section 360bbb-3(b)(1), unless the authorization is terminated or revoked.  Performed at [facility], [address], [place], [place] [address]  Hemoglobin A1c     Status: None    Collection Time: 09/11/20  5:03 PM  Result Value Ref Range   Hgb A1c MFr Bld 5.3 4.8 - 5.6 %    Comment: (NOTE) Pre diabetes:          5.7%-6.4%  Diabetes:              >6.4%  Glycemic control for   <7.0% adults with diabetes    Mean Plasma Glucose 105.41 mg/dL    Comment: Performed at [facility], [address] [place], [place] [address]  Glucose, capillary     Status: Abnormal   Collection Time: 09/11/20  7:31 PM  Result Value Ref Range   Glucose-Capillary 107 (H) 70 - 99 mg/dL    Comment: Glucose reference range applies only to samples taken after fasting for at least 8 hours.  Lipid panel     Status: Abnormal   Collection Time: 09/12/20 12:25 AM  Result Value Ref Range   Cholesterol 230 (H) 0 - 200 mg/dL   Triglycerides 83 <604 mg/dL   HDL 53 >54 mg/dL   Total CHOL/HDL Ratio 4.3 RATIO   VLDL 17 0 - 40 mg/dL   LDL Cholesterol 098 (H) 0 - 99 mg/dL    Comment:        Total Cholesterol/HDL:CHD Risk Coronary Heart Disease Risk Table                     Men   Women  1/2 Average Risk   3.4   3.3  Average Risk       5.0   4.4  2 X Average Risk   9.6   7.1  3 X Average Risk  23.4   11.0        Use the calculated Patient Ratio above and the CHD Risk Table to determine the patient's CHD Risk.        ATP III CLASSIFICATION (LDL):  <100     mg/dL   Optimal  119-147  mg/dL   Near or Above                    Optimal  130-159  mg/dL   Borderline  829-562  mg/dL   High  >130     mg/dL   Very High Performed at [facility], [address], [place], [place] [address]   CK     Status: None   Collection Time: 09/12/20 12:25 AM  Result Value Ref Range   Total CK 78 38 - 234 U/L    Comment: Performed at [facility], [address] [place], [place] [address]  CBC     Status: None   Collection Time: 09/12/20 12:25 AM  Result Value Ref Range   WBC 4.3 4.0 - 10.5 K/uL   RBC 4.16 3.87 - 5.11 MIL/uL   Hemoglobin 12.0 12.0 - 15.0 g/dL   HCT 95.2 84.1 - 32.4 %    MCV 89.9 80.0 - 100.0 fL   MCH 28.8 26.0 - 34.0 pg   MCHC 32.1 30.0 - 36.0 g/dL   RDW 40.1 02.7 - 25.3 %   Platelets 156 150 - 400 K/uL   nRBC 0.0 0.0 - 0.2 %    Comment: Performed at [facility], [address], [place], [place] [address]    Studies/Results: ANGIO   Findings: There was an occlusion of the cervical right ICA at  the bulb and distal right M1/MCA occlusion. Mechanical thrombectomy performed with combined stent retriever (embotrap) and direct contact aspiration with complete recanalization (TICI3). Following, angioplasty and stenting of the right carotid bifurcation. Stable non flow limiting stenosis of the mid cervical ICA noted, not progressed on delay images.    HEAD NECK CTA IMPRESSION: 1. 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 2. 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 3. Progressive proximal right M1 segment stenosis with significant loss of perfusion in the right MCA branch vessels. 4. Stable atherosclerotic changes at the left carotid bifurcation without significant stenosis. 5. 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 Basilar artery is similar the prior exam. 6.  Aortic Atherosclerosis (ICD10-I70.0).      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 No evidence of hemorrhage.      This patient is critically ill due to stroke post procedure and at significant risk of neurological worsening, death form severe anemia, bleeding, recurrent stroke, intracranial stenosis. This patient's care requires constant monitoring of vital signs, hemodynamics, respiratory and cardiac monitoring, review of multiple databases, neurological assessment, other specialists and medical decision  making of high complexity. I spent 35 minutes of neurocritical care time in the care of this patient    Kaya Klausing A. Gerilyn Pilgrim, M.D.  Diplomate, Biomedical engineer of Psychiatry and Neurology ( Neurology). 09/12/2020, 3:34 PM

## 2020-09-13 LAB — COMPREHENSIVE METABOLIC PANEL
ALT: 8 U/L (ref 0–44)
AST: 16 U/L (ref 15–41)
Albumin: 3.2 g/dL — ABNORMAL LOW (ref 3.5–5.0)
Alkaline Phosphatase: 60 U/L (ref 38–126)
Anion gap: 8 (ref 5–15)
BUN: 10 mg/dL (ref 8–23)
CO2: 27 mmol/L (ref 22–32)
Calcium: 8.8 mg/dL — ABNORMAL LOW (ref 8.9–10.3)
Chloride: 103 mmol/L (ref 98–111)
Creatinine, Ser: 0.77 mg/dL (ref 0.44–1.00)
GFR, Estimated: >60 mL/min (ref >60)
Glucose, Bld: 87 mg/dL (ref 70–99)
Potassium: 2.8 mmol/L — ABNORMAL LOW (ref 3.5–5.1)
Sodium: 138 mmol/L (ref 135–145)
Total Bilirubin: 0.2 mg/dL — ABNORMAL LOW (ref 0.3–1.2)
Total Protein: 6.6 g/dL (ref 6.5–8.1)

## 2020-09-13 LAB — CBC
HCT: 33.4 % — ABNORMAL LOW (ref 36.0–46.0)
Hemoglobin: 11 g/dL — ABNORMAL LOW (ref 12.0–15.0)
MCH: 29.7 pg (ref 26.0–34.0)
MCHC: 32.9 g/dL (ref 30.0–36.0)
MCV: 90.3 fL (ref 80.0–100.0)
Platelets: 145 10*3/uL — ABNORMAL LOW (ref 150–400)
RBC: 3.7 MIL/uL — ABNORMAL LOW (ref 3.87–5.11)
RDW: 13.1 % (ref 11.5–15.5)
WBC: 6.9 10*3/uL (ref 4.0–10.5)
nRBC: 0 % (ref 0.0–0.2)

## 2020-09-13 LAB — MAGNESIUM: Magnesium: 1.9 mg/dL (ref 1.7–2.4)

## 2020-09-13 MED ORDER — POTASSIUM CHLORIDE CRYS ER 20 MEQ PO TBCR
40.0000 meq | EXTENDED_RELEASE_TABLET | Freq: Once | ORAL | Status: AC
  Administered 2020-09-13: 40 meq via ORAL
  Filled 2020-09-13: qty 2

## 2020-09-13 MED ORDER — HYDRALAZINE HCL 20 MG/ML IJ SOLN
5.0000 mg | INTRAMUSCULAR | Status: DC | PRN
Start: 2020-09-13 — End: 2020-09-16
  Administered 2020-09-14: 5 mg via INTRAVENOUS
  Administered 2020-09-14 – 2020-09-15 (×3): 10 mg via INTRAVENOUS
  Filled 2020-09-13 (×4): qty 1

## 2020-09-13 MED ORDER — NEOMYCIN-POLYMYXIN-HC 3.5-10000-1 OT SUSP
3.0000 [drp] | Freq: Four times a day (QID) | OTIC | Status: DC
  Administered 2020-09-13 – 2020-09-16 (×12): 3 [drp] via OTIC
  Filled 2020-09-13 (×2): qty 10

## 2020-09-13 MED ORDER — POTASSIUM CHLORIDE 10 MEQ/100ML IV SOLN
10.0000 meq | INTRAVENOUS | Status: DC
  Administered 2020-09-13 (×6): 10 meq via INTRAVENOUS
  Filled 2020-09-13 (×6): qty 100

## 2020-09-13 NOTE — Anesthesia Postprocedure Evaluation (Signed)
Anesthesia Post Note  Patient: [name]  Procedure(s) Performed: IR WITH ANESTHESIA (N/A )     Patient location during evaluation: SICU Anesthesia Type: General Level of consciousness: patient cooperative and awake Pain management: pain level controlled Vital Signs Assessment: post-procedure vital signs reviewed and stable Respiratory status: spontaneous breathing, nonlabored ventilation, respiratory function stable and patient connected to nasal cannula oxygen Cardiovascular status: stable and blood pressure returned to baseline Postop Assessment: no apparent nausea or vomiting Anesthetic complications: no   No complications documented.  Last Vitals:  Vitals:   09/13/20 0800 09/13/20 1200  BP:    Pulse:    Resp:    Temp: 37 C 36.4 C  SpO2:      Last Pain:  Vitals:   09/13/20 1200  TempSrc: Axillary  PainSc:                  Maleni Seyer

## 2020-09-13 NOTE — Progress Notes (Signed)
PROGRESS NOTE    [name]  WUJ:[id number] DOB: [birth year] DOA: 09/10/2020 PCP: Patient, No Pcp Per (Inactive)    Brief Narrative:  75 y.o. female with medical history significant of HTN, not on any antihypertensive medications.  Patient reports she is from [place] and last PCP visit was more than a year ago and she is without medication for that duration.  She complains of having dizziness and unsteady gait for 2 weeks which has been worse in the morning today which brought her to the emergency room.  After she arrived in the ED she has developed left-sided weakness, left-sided facial droop and left neglect.  Stroke code was activated. Exact time of onset of symptoms unknown since patient has been having unsteady gait for few days, Patient also reports intermittent headache because of uncontrolled hypertension.  Patient symptoms has resolved within an hour.  Patient was seen by tele neurologist Dr. Iver Nestle, recommended  stroke work-up, dual antiplatelet agents , aspirin and Plavix, statins.  Patient denies any recent travel, sick contacts, cough, weight loss, chest pain, shortness of breath.   ED Course: She was hemodynamically stable except hypertension.   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 At least partial reconstitution at the clinoid intracranially. Less than 50% stenosis at the left ICA origin.  Assessment & Plan:   Active Problems:   TIA (transient ischemic attack)  Dizziness /unsteady gait secondary to  acute and subacute R parietal infarct:  -MRI brain reviewed. Findings of R parietal small to moderate size acute and subacute infarct with few additional punctate acute to subacute R centrum semiovale infarcts noted  -CTA head and neck: Age indeterminate occlusion of right cervical ICA just beyond the origin.  Less than 50% stenosis of left ICA origin -Pt was initially given Aspirin 325 mg once followed by 81 mg daily and Plavix 300 mg once followed by 75 mg daily -Continue Lipitor 80 mg daily -Evens from 5/6 noted. On the evening of 5/6, patient was noted to have acute weakness on the L, prompting code stroke. Pt underwent emergent thrombectomy, since transferred to ICU -Pt remains stable in ICU. Continue with adequate BP control -PT/OT following, recs for CIR noted. Have consulted CIR  Hypertension: -No bp meds per home med rec -Had started low dose lisinopril 5/6 -Currently remains on clevidipine for tight bp control. Wean as tolerated  QT prolongation: -Initial EKG with QTc of 550 -Repeat EKG with qtc of 487 -Would continue to replace lytes as needed  DVT prophylaxis: SCD's Code Status: Full Family Communication: Pt in room, family not at bedside  Status is: Inpatient  Remains inpatient appropriate because:Inpatient level of care appropriate due to severity of illness   Dispo: The patient is from: Home              Anticipated d/c is to: Home              Patient currently is not medically stable to d/c.   Difficult to place patient No   Consultants:   Neurology  IR  Procedures:  Emergent thrombectomy 5/6  Antimicrobials: Anti-infectives (From admission, onward)   None      Subjective: Reports feeling better today  Objective: Vitals:   09/13/20 0645 09/13/20 0700 09/13/20 0800 09/13/20 1200  BP: (!) 134/55 (!) 135/55    Pulse: (!) 38 (!) 40    Resp: 18 18    Temp:   98.6 F (37 C) 97.6 F (36.4 C)  TempSrc:   Axillary Axillary  SpO2: 99% 98%     Weight:      Height:        Intake/Output Summary (Last 24 hours) at 09/13/2020 1721 Last data filed at 09/13/2020 0800 Gross per 24 hour  Intake 334.88 ml  Output 1000 ml  Net -665.12 ml   Filed Weights   09/10/20 1454  Weight: 95.3 kg    Examination: General exam: Awake, laying in bed, in nad Respiratory system: Normal respiratory effort, no wheezing Cardiovascular system: regular rate, s1, s2 Gastrointestinal system: Soft, nondistended, positive BS Central nervous system: CN2-12 grossly intact, strength intact Extremities: Perfused, no clubbing Skin: Normal skin turgor, no notable skin lesions seen Psychiatry: Mood normal // no visual hallucinations   Data Reviewed: I have personally reviewed following labs and imaging studies  CBC: Recent Labs  Lab 09/10/20 1540 09/10/20 1559 09/12/20 0025 09/13/20 0312  WBC 4.4  --  4.3 6.9  NEUTROABS 1.9  --   --   --   HGB 12.2 12.6 12.0 11.0*  HCT 38.3 37.0 37.4 33.4*  MCV 90.5  --  89.9 90.3  PLT 141*  --  156 145*   Basic Metabolic Panel: Recent Labs  Lab 09/10/20 1540 09/10/20 1559 09/13/20 0312  NA 141 143 138  K 3.3* 3.7 2.8*  CL 106 105 103  CO2 26  --  27  GLUCOSE 88 84 87  BUN CREATININE 0.71 0.90 0.77  CALCIUM 8.7*  --  8.8*  MG  --   --  1.9   GFR: Estimated Creatinine Clearance: 74.8 mL/min (by C-G formula based on SCr of 0.77 mg/dL). Liver Function Tests: Recent Labs  Lab 09/10/20 1540 09/13/20 0312  AST 17 16  ALT 8 8  ALKPHOS 67 60  BILITOT 0.4 0.2*  PROT 7.1 6.6  ALBUMIN 3.6 3.2*   No results for input(s): LIPASE, AMYLASE in the last 168 hours. No results for input(s): AMMONIA in the last 168 hours. Coagulation Profile: Recent Labs  Lab 09/10/20 1540  INR 1.0   Cardiac Enzymes: Recent Labs  Lab 09/12/20 0025  CKTOTAL 78   BNP (last 3 results) No results for input(s): PROBNP in the last 8760 hours. HbA1C: Recent Labs    09/11/20 1703  HGBA1C 5.3   CBG: Recent  Labs  Lab 09/10/20 1544 09/11/20 1931  GLUCAP 87 107*   Lipid Profile: Recent Labs    09/12/20 0025  CHOL 230*  HDL 53  LDLCALC 160*  TRIG 83  CHOLHDL 4.3   Thyroid Function Tests: No results for input(s): TSH, T4TOTAL, FREET4, T3FREE, THYROIDAB in the last 72 hours. Anemia Panel: No results for input(s): VITAMINB12, FOLATE, FERRITIN, TIBC, IRON, RETICCTPCT in the last 72 hours. Sepsis Labs: No results for input(s): PROCALCITON, LATICACIDVEN in the last 168 hours.  Recent Results (from the past 240 hour(s))  Resp Panel by RT-PCR (Flu A&B, Covid) Nasopharyngeal Swab     Status: None   Collection Time: 09/10/20  4:27 PM   Specimen: Nasopharyngeal Swab; Nasopharyngeal(NP) swabs in vial transport  medium  Result Value Ref Range Status   SARS Coronavirus 2 by RT PCR NEGATIVE NEGATIVE Final    Comment: (NOTE) SARS-CoV-2 target nucleic acids are NOT DETECTED.  The SARS-CoV-2 RNA is generally detectable in upper respiratory specimens during the acute phase of infection. The lowest concentration of SARS-CoV-2 viral copies this assay can detect is 138 copies/mL. A negative result does not preclude SARS-Cov-2 infection and should not be used as the sole basis for treatment or other patient management decisions. A negative result may occur with  improper specimen collection/handling, submission of specimen other than nasopharyngeal swab, presence of viral mutation(s) within the areas targeted by this assay, and inadequate number of viral copies(<138 copies/mL). A negative result must be combined with clinical observations, patient history, and epidemiological information. The expected result is Negative.  Fact Sheet for Patients:  BloggerCourse.com  Fact Sheet for Healthcare Providers:  SeriousBroker.it  This test is no t yet approved or cleared by the [place] FDA and  has been authorized for detection and/or diagnosis of  SARS-CoV-2 by FDA under an Emergency Use Authorization (EUA). This EUA will remain  in effect (meaning this test can be used) for the duration of the COVID-19 declaration under Section 564(b)(1) of the Act, 21 U.S.C.section 360bbb-3(b)(1), unless the authorization is terminated  or revoked sooner.       Influenza A by PCR NEGATIVE NEGATIVE Final   Influenza B by PCR NEGATIVE NEGATIVE Final    Comment: (NOTE) The Xpert Xpress SARS-CoV-2/FLU/RSV plus assay is intended as an aid in the diagnosis of influenza from Nasopharyngeal swab specimens and should not be used as a sole basis for treatment. Nasal washings and aspirates are unacceptable for Xpert Xpress SARS-CoV-2/FLU/RSV testing.  Fact Sheet for Patients: BloggerCourse.com  Fact Sheet for Healthcare Providers: SeriousBroker.it  This test is not yet approved or cleared by the [place] FDA and has been authorized for detection and/or diagnosis of SARS-CoV-2 by FDA under an Emergency Use Authorization (EUA). This EUA will remain in effect (meaning this test can be used) for the duration of the COVID-19 declaration under Section 564(b)(1) of the Act, 21 U.S.C. section 360bbb-3(b)(1), unless the authorization is terminated or revoked.  Performed at [facility], [address], [place], [place] [address]      Radiology Studies: CT CEREBRAL PERFUSION W CONTRAST  Result Date: 09/11/2020 CLINICAL DATA:  Progressive left-sided weakness. EXAM: CT ANGIOGRAPHY HEAD AND NECK CT PERFUSION BRAIN TECHNIQUE: Multidetector CT imaging of the head and neck was performed using the standard protocol during bolus administration of intravenous contrast. Multiplanar CT image reconstructions and MIPs were obtained to evaluate the vascular anatomy. Carotid stenosis measurements (when applicable) are obtained utilizing NASCET criteria, using the distal internal carotid diameter as  the denominator. Multiphase CT imaging of the brain was performed following IV bolus contrast injection. Subsequent parametric perfusion maps were calculated using RAPID software. CONTRAST:  OMNIPAQUE IOHEXOL 350 MG/ML SOLN COMPARISON:  MR head without contrast, CT head without contrast, and CTA head and neck 09/10/2020. FINDINGS: CTA NECK FINDINGS Aortic arch: No significant interval change in the 3 vessel aortic arch with atherosclerotic calcifications. Right carotid system: The right common carotid artery is unremarkable. The right ICA is occluded at the bifurcation without significant reconstitution in the neck. Left carotid system: The left common carotid artery is within normal limits. Dense calcifications are present at the proximal ICA without a significant stenosis relative to the more distal vessel. There is mild tortuosity without  significant stenosis in the cervical ICA. Vertebral arteries: Left vertebral artery is dominant vessel. Both vertebral arteries originate from the subclavian arteries without significant stenosis. No significant stenosis is present in either vertebral artery in the neck. Skeleton: Multilevel degenerative changes are present. Anterior osteophytes are fused at C3-4. No significant interval change. Other neck: No acute abnormality or significant interval change. Upper chest: Lung apices are clear.  Thoracic inlet is normal. Review of the MIP images confirms the above findings CTA HEAD FINDINGS Anterior circulation: The right internal carotid artery remains occluded through the ophthalmic segment. Atherosclerotic changes are present the cavernous left ICA without significant stenosis through the ICA terminus. The left A1 segment and right A1 segment are similar the prior exam. Proximal right M1 segment narrowing has progressed. Is some irregularity in the left M1 segment without significant stenosis. There is significant loss of perfusion right MCA branch vessels compared to  prior exam. Left MCA branch vessels remain intact. ACA vessels are unchanged. Posterior circulation: Moderate narrowing is present at the dural margin of the right vertebral artery, progressive from the prior exam. Left vertebral artery is unchanged. Vertebrobasilar junction normal. The basilar artery is small. Both posterior cerebral arteries originate from the basilar tip. Prominent right posterior communicating artery is present. There is segmental attenuation PCA branch vessels similar the prior study. Venous sinuses: Dural sinuses are patent. Straight sinus and deep cerebral veins are intact. Anatomic variants: None Review of the MIP images confirms the above findings CT Brain Perfusion Findings: ASPECTS: 10/10 CBF (<30%) Volume: 34mL Perfusion (Tmax>6.0s) volume: Mismatch Volume: Infarction Location:Right MCA territory IMPRESSION: 1. 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 2. 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 3. Progressive proximal right M1 segment stenosis with significant loss of perfusion in the right MCA branch vessels. 4. Stable atherosclerotic changes at the left carotid bifurcation without significant stenosis. 5. 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 Basilar artery is similar the prior exam. 6.  Aortic Atherosclerosis (ICD10-I70.0). Electronically Signed   By: Marin Roberts M.D.   On: 09/11/2020 20:25   ECHOCARDIOGRAM COMPLETE  Result Date: 09/12/2020    ECHOCARDIOGRAM REPORT   Patient Name:   [name] Date of Exam: 09/12/2020 Medical Rec #:  [id number]   Height:       70.0 in Accession #:    [id number]  Weight:       210.0 lb Date of Birth:  May 16, 1945  BSA:          2.131 m Patient Age:    74 years    BP:           99/45 mmHg Patient Gender: F           HR:           36 bpm. Exam Location:  Inpatient Procedure: 2D Echo, Cardiac  Doppler and Color Doppler Indications:    TIA  History:        Patient has no prior history of Echocardiogram examinations.                 CHF, COPD; Risk Factors:Hypertension and Diabetes.  Sonographer:    Neomia Dear RDCS Referring Phys: [id number] PARDEEP INOMV  Sonographer Comments: No cardiac surgery noted in chart. IMPRESSIONS  1. Left ventricular ejection fraction, by estimation, is 60 to 65%. The left ventricle has normal function. The left ventricle has no  regional wall motion abnormalities. There is mild left ventricular hypertrophy. Left ventricular diastolic parameters are consistent with Grade I diastolic dysfunction (impaired relaxation).  2. Right ventricular systolic function is normal. The right ventricular size is normal. Tricuspid regurgitation signal is inadequate for assessing PA pressure.  3. Left atrial size was moderately dilated.  4. Right atrial size was mildly dilated.  5. The mitral valve is normal in structure. Trivial mitral valve regurgitation. No evidence of mitral stenosis.  6. The aortic valve is grossly normal. There is mild calcification of the aortic valve. Aortic valve regurgitation is not visualized. No aortic stenosis is present.  7. Aortic dilatation noted. There is borderline dilatation of the ascending aorta, measuring 39 mm.  8. The inferior vena cava is normal in size with greater than 50% respiratory variability, suggesting right atrial pressure of 3 mmHg. Conclusion(s)/Recommendation(s): No intracardiac source of embolism detected on this transthoracic study. A transesophageal echocardiogram is recommended to exclude cardiac source of embolism if clinically indicated. FINDINGS  Left Ventricle: Left ventricular ejection fraction, by estimation, is 60 to 65%. The left ventricle has normal function. The left ventricle has no regional wall motion abnormalities. The left ventricular internal cavity size was normal in size. There is  mild left ventricular hypertrophy. Left  ventricular diastolic parameters are consistent with Grade I diastolic dysfunction (impaired relaxation). Right Ventricle: The right ventricular size is normal. No increase in right ventricular wall thickness. Right ventricular systolic function is normal. Tricuspid regurgitation signal is inadequate for assessing PA pressure. Left Atrium: Left atrial size was moderately dilated. Right Atrium: Right atrial size was mildly dilated. Pericardium: There is no evidence of pericardial effusion. Mitral Valve: The mitral valve is normal in structure. Trivial mitral valve regurgitation. No evidence of mitral valve stenosis. Tricuspid Valve: The tricuspid valve is normal in structure. Tricuspid valve regurgitation is not demonstrated. No evidence of tricuspid stenosis. Aortic Valve: The aortic valve is grossly normal. There is mild calcification of the aortic valve. Aortic valve regurgitation is not visualized. No aortic stenosis is present. Aortic valve mean gradient measures 4.0 mmHg. Aortic valve peak gradient measures 9.5 mmHg. Aortic valve area, by VTI measures 3.12 cm. Pulmonic Valve: The pulmonic valve was normal in structure. Pulmonic valve regurgitation is trivial. No evidence of pulmonic stenosis. Aorta: Aortic dilatation noted. There is borderline dilatation of the ascending aorta, measuring 39 mm. Venous: The inferior vena cava is normal in size with greater than 50% respiratory variability, suggesting right atrial pressure of 3 mmHg. IAS/Shunts: No atrial level shunt detected by color flow Doppler. EKG: Rhythm strip during this exam demostrated sinus bradycardia.  LEFT VENTRICLE PLAX 2D LVIDd:         5.20 cm      Diastology LV PW:         1.20 cm      LV e' medial:    5.11 cm/s LV IVS:        1.30 cm      LV E/e' medial:  15.2 LVOT diam:     2.30 cm      LV e' lateral:   6.57 cm/s LV SV:         115          LV E/e' lateral: 11.9 LV SV Index:   54 LVOT Area:     4.15 cm  LV Volumes (MOD) LV vol d, MOD A2C:  63.5 ml LV vol d, MOD A4C: 130.0 ml LV vol s, MOD A2C: 27.5 ml  LV vol s, MOD A4C: 27.9 ml LV SV MOD A2C:     36.0 ml LV SV MOD A4C:     130.0 ml LV SV MOD BP:      68.8 ml RIGHT VENTRICLE RV S prime:     16.30 cm/s  PULMONARY VEINS TAPSE (M-mode): 3.0 cm      A Reversal Duration: 113.00 msec                             A Reversal Velocity: 32.80 cm/s                             Diastolic Velocity:  39.40 cm/s                             S/D Velocity:        1.40                             Systolic Velocity:   55.00 cm/s LEFT ATRIUM             Index LA diam:        4.30 cm 2.02 cm/m LA Vol (A2C):   95.6 ml 44.86 ml/m LA Vol (A4C):   80.7 ml 37.87 ml/m LA Biplane Vol: 90.0 ml 42.23 ml/m  AORTIC VALVE                   PULMONIC VALVE AV Area (Vmax):    3.26 cm    PV Vmax:       1.03 m/s AV Area (Vmean):   3.10 cm    PV Vmean:      68.500 cm/s AV Area (VTI):     3.12 cm    PV VTI:        0.305 m AV Vmax:           154.00 cm/s PV Peak grad:  4.2 mmHg AV Vmean:          94.300 cm/s PV Mean grad:  2.0 mmHg AV VTI:            0.367 m AV Peak Grad:      9.5 mmHg AV Mean Grad:      4.0 mmHg LVOT Vmax:         121.00 cm/s LVOT Vmean:        70.300 cm/s LVOT VTI:          0.276 m LVOT/AV VTI ratio: 0.75  AORTA Ao Root diam: 3.40 cm Ao Asc diam:  3.90 cm MITRAL VALVE MV Area (PHT): 2.48 cm    SHUNTS MV Decel Time: 306 msec    Systemic VTI:  0.28 m MV E velocity: 77.90 cm/s  Systemic Diam: 2.30 cm MV A velocity: 94.00 cm/s MV E/A ratio:  0.83 Weston Brass MD Electronically signed by Weston Brass MD Signature Date/Time: 09/12/2020/4:17:21 PM    Final    CT HEAD CODE STROKE WO CONTRAST  Result Date: 09/11/2020 CLINICAL DATA:  Code stroke. Neuro deficit. Acute stroke suspected. Dizziness and unsteady gait for 2 weeks. EXAM: CT HEAD WITHOUT CONTRAST TECHNIQUE: Contiguous axial images were obtained from the base of the skull through the vertex without intravenous contrast. COMPARISON:  CT head without contrast and CTA  head and neck 09/10/2020. FINDINGS: Brain: Right  parietal acute/subacute infarct stable. No new infarct present. Basal ganglia are intact. Insular ribbon normal. The ventricles are of normal size. No significant extraaxial fluid collection is present. Vascular: Atherosclerotic calcifications are present within the cavernous internal carotid arteries bilaterally. No hyperdense vessel is present. Skull: Calvarium is intact. No focal lytic or blastic lesions are present. Sinuses/Orbits: Bilateral maxillary sinus fluid collections are present. Scattered ethmoid opacification noted. Sphenoid sinuses are clear. Frontal sinuses demonstrate mild mucosal thickening. Mastoid air cells are clear. The globes and orbits are within normal limits. ASPECTS [facility] Stroke Program Early CT Score) - Ganglionic level infarction (caudate, lentiform nuclei, internal capsule, insula, M1-M3 cortex): 7/7 - Supraganglionic infarction (M4-M6 cortex): 3/3 Total score (0-10 with 10 being normal): 10/10 IMPRESSION: 1. Stable appearance of right parietal acute/subacute infarct. 2. No new infarct. 3. ASPECTS is 10/10. Electronically Signed   By: Marin Roberts M.D.   On: 09/11/2020 20:00   CT ANGIO HEAD NECK W WO CM W PERF  Result Date: 09/11/2020 CLINICAL DATA:  Progressive left-sided weakness. EXAM: CT ANGIOGRAPHY HEAD AND NECK CT PERFUSION BRAIN TECHNIQUE: Multidetector CT imaging of the head and neck was performed using the standard protocol during bolus administration of intravenous contrast. Multiplanar CT image reconstructions and MIPs were obtained to evaluate the vascular anatomy. Carotid stenosis measurements (when applicable) are obtained utilizing NASCET criteria, using the distal internal carotid diameter as the denominator. Multiphase CT imaging of the brain was performed following IV bolus contrast injection. Subsequent parametric perfusion maps were calculated using RAPID software. CONTRAST:  OMNIPAQUE IOHEXOL 350  MG/ML SOLN COMPARISON:  MR head without contrast, CT head without contrast, and CTA head and neck 09/10/2020. FINDINGS: CTA NECK FINDINGS Aortic arch: No significant interval change in the 3 vessel aortic arch with atherosclerotic calcifications. Right carotid system: The right common carotid artery is unremarkable. The right ICA is occluded at the bifurcation without significant reconstitution in the neck. Left carotid system: The left common carotid artery is within normal limits. Dense calcifications are present at the proximal ICA without a significant stenosis relative to the more distal vessel. There is mild tortuosity without significant stenosis in the cervical ICA. Vertebral arteries: Left vertebral artery is dominant vessel. Both vertebral arteries originate from the subclavian arteries without significant stenosis. No significant stenosis is present in either vertebral artery in the neck. Skeleton: Multilevel degenerative changes are present. Anterior osteophytes are fused at C3-4. No significant interval change. Other neck: No acute abnormality or significant interval change. Upper chest: Lung apices are clear.  Thoracic inlet is normal. Review of the MIP images confirms the above findings CTA HEAD FINDINGS Anterior circulation: The right internal carotid artery remains occluded through the ophthalmic segment. Atherosclerotic changes are present the cavernous left ICA without significant stenosis through the ICA terminus. The left A1 segment and right A1 segment are similar the prior exam. Proximal right M1 segment narrowing has progressed. Is some irregularity in the left M1 segment without significant stenosis. There is significant loss of perfusion right MCA branch vessels compared to prior exam. Left MCA branch vessels remain intact. ACA vessels are unchanged. Posterior circulation: Moderate narrowing is present at the dural margin of the right vertebral artery, progressive from the prior exam. Left  vertebral artery is unchanged. Vertebrobasilar junction normal. The basilar artery is small. Both posterior cerebral arteries originate from the basilar tip. Prominent right posterior communicating artery is present. There is segmental attenuation PCA branch vessels similar the prior study. Venous sinuses: Dural sinuses are patent.  Straight sinus and deep cerebral veins are intact. Anatomic variants: None Review of the MIP images confirms the above findings CT Brain Perfusion Findings: ASPECTS: 10/10 CBF (<30%) Volume: 0mL Perfusion (Tmax>6.0s) volume: Mismatch Volume: Infarction Location:Right MCA territory IMPRESSION: 1. 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 2. 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 3. Progressive proximal right M1 segment stenosis with significant loss of perfusion in the right MCA branch vessels. 4. Stable atherosclerotic changes at the left carotid bifurcation without significant stenosis. 5. 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 Basilar artery is similar the prior exam. 6.  Aortic Atherosclerosis (ICD10-I70.0). Electronically Signed   By: Marin Roberts M.D.   On: 09/11/2020 20:25    Scheduled Meds: . aspirin EC  81 mg Oral Daily  . atorvastatin  80 mg Oral QHS  . Chlorhexidine Gluconate Cloth  6 each Topical Daily  . clopidogrel  75 mg Oral Daily  . lisinopril  10 mg Oral Daily  . neomycin-polymyxin-hydrocortisone  3 drop Right EAR Q6H  . potassium chloride  40 mEq Oral Once   Continuous Infusions: . sodium chloride Stopped (09/11/20 0900)  . sodium chloride    . clevidipine 2 mg/hr (09/13/20 0800)     LOS: 3 days   Rickey Barbara, MD [facility] Pager On Amion  If 7PM-7AM, please contact night-coverage 09/13/2020, 5:21 PM

## 2020-09-13 NOTE — Plan of Care (Signed)
  Problem: Education: Goal: Knowledge of General Education information will improve Description Including pain rating scale, medication(s)/side effects and non-pharmacologic comfort measures Outcome: Progressing   Problem: Health Behavior/Discharge Planning: Goal: Ability to manage health-related needs will improve Outcome: Progressing   

## 2020-09-13 NOTE — Progress Notes (Signed)
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 R ICA mechanical thrombectomy- revascularization in IR 5/7  Subjective:  Doing well this am Speech is clear and more alert today Answers all questions appropriately Moving all 4s-- even left arm and hand Left sl slower  Allergies: Patient has no known allergies.  Medications: Prior to Admission medications   Medication Sig Start Date End Date Taking? Authorizing Provider  ibuprofen (ADVIL) 200 MG tablet Take 200 mg by mouth every 6 (six) hours as needed for headache.   Yes [provider]     Vital Signs: BP (!) 135/55   Pulse (!) 40   Temp 98.7 F (37.1 C) (Axillary)   Resp 18   Ht  (1.778 m)   Wt 210 lb (95.3 kg)   SpO2 98%   BMI 30.13 kg/m   Physical Exam Musculoskeletal:        General: Normal range of motion.     Right lower leg: No edema.     Left lower leg: No edema.     Comments: Moving all 4s Left hand and arm doing so much better Good strength   Rt groin NT no bleeding     Imaging: CT Angio Head W or Wo Contrast  Result Date: 09/10/2020 CLINICAL DATA:  Right gaze preference and left facial droop EXAM: CT ANGIOGRAPHY HEAD AND NECK TECHNIQUE: Multidetector CT imaging of the head and neck was performed using the standard protocol during bolus administration of intravenous contrast. Multiplanar CT image reconstructions and MIPs were obtained to evaluate the vascular anatomy. Carotid stenosis measurements (when applicable) are obtained utilizing NASCET criteria, using the distal internal carotid diameter as the denominator. CONTRAST:  75mL OMNIPAQUE IOHEXOL 350 MG/ML SOLN COMPARISON:  None. FINDINGS: CTA NECK Aortic arch: Eccentric noncalcified plaque along the visualized distal aortic arch. Great vessel origins are patent. No high-grade  proximal subclavian stenosis. Right carotid system: Common carotid is patent. There is mixed plaque at the bifurcation. ICA is occluded just beyond the origin. ECA is patent. Left carotid system: Patent. Atherosclerotic wall thickening along the common carotid. Mixed but primarily calcified plaque at the bifurcation and along the proximal internal carotid. There is less than 50% stenosis. Vertebral arteries: Patent. Left vertebral artery slightly dominant. Mild plaque at the origins. Skeleton: Degenerative changes of the included spine. Other neck: No acute abnormality. Upper chest: No apical lung mass. Review of the MIP images confirms the above findings CTA HEAD Anterior circulation: At least partial reconstitution of the right ICA at the clinoid where there is calcified plaque. Right anterior and middle cerebral arteries are patent. Atherosclerotic irregularity of the right M1 MCA with mild and moderate stenoses. Mild distal branch atherosclerotic irregularity. Left intracranial internal carotid arteries patent with calcified plaque causing mild stenosis. Left anterior and middle cerebral arteries are patent. Atherosclerotic irregularity of the left M1 MCA with mild stenosis. Mild distal branch atherosclerotic irregularity. Posterior circulation: Intracranial vertebral arteries are patent. Basilar artery is patent. Major cerebellar artery origins are patent. Posterior cerebral arteries are patent. Focal moderate stenosis of the right P2 PCA. A right posterior communicating artery is present. Venous sinuses: Patent as allowed by contrast bolus timing. 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 At least partial reconstitution at the clinoid intracranially.  Less than 50% stenosis at the left ICA origin. Mild to moderate intracranial atherosclerosis. Results were discussed by telephone at the time of interpretation on  09/10/2020 at 4:34 pm with provider Dr. Iver Nestle, who verbally acknowledged these results. Electronically Signed   By: Guadlupe Spanish M.D.   On: 09/10/2020 16:37   CT Angio Neck W and/or Wo Contrast  Result Date: 09/10/2020 CLINICAL DATA:  Right gaze preference and left facial droop EXAM: CT ANGIOGRAPHY HEAD AND NECK TECHNIQUE: Multidetector CT imaging of the head and neck was performed using the standard protocol during bolus administration of intravenous contrast. Multiplanar CT image reconstructions and MIPs were obtained to evaluate the vascular anatomy. Carotid stenosis measurements (when applicable) are obtained utilizing NASCET criteria, using the distal internal carotid diameter as the denominator. CONTRAST:  75mL OMNIPAQUE IOHEXOL 350 MG/ML SOLN COMPARISON:  None. FINDINGS: CTA NECK Aortic arch: Eccentric noncalcified plaque along the visualized distal aortic arch. Great vessel origins are patent. No high-grade proximal subclavian stenosis. Right carotid system: Common carotid is patent. There is mixed plaque at the bifurcation. ICA is occluded just beyond the origin. ECA is patent. Left carotid system: Patent. Atherosclerotic wall thickening along the common carotid. Mixed but primarily calcified plaque at the bifurcation and along the proximal internal carotid. There is less than 50% stenosis. Vertebral arteries: Patent. Left vertebral artery slightly dominant. Mild plaque at the origins. Skeleton: Degenerative changes of the included spine. Other neck: No acute abnormality. Upper chest: No apical lung mass. Review of the MIP images confirms the above findings CTA HEAD Anterior circulation: At least partial reconstitution of the right ICA at the clinoid where there is calcified plaque. Right anterior and middle cerebral arteries are patent. Atherosclerotic irregularity of the right M1 MCA with mild and moderate stenoses. Mild distal branch atherosclerotic irregularity. Left intracranial internal carotid  arteries patent with calcified plaque causing mild stenosis. Left anterior and middle cerebral arteries are patent. Atherosclerotic irregularity of the left M1 MCA with mild stenosis. Mild distal branch atherosclerotic irregularity. Posterior circulation: Intracranial vertebral arteries are patent. Basilar artery is patent. Major cerebellar artery origins are patent. Posterior cerebral arteries are patent. Focal moderate stenosis of the right P2 PCA. A right posterior communicating artery is present. Venous sinuses: Patent as allowed by contrast bolus timing. 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 At least partial reconstitution at the clinoid intracranially. Less than 50% stenosis at the left ICA origin. Mild to moderate intracranial atherosclerosis. Results were discussed by telephone at the time of interpretation on 09/10/2020 at 4:34 pm with provider Dr. Iver Nestle, who verbally acknowledged these results. Electronically Signed   By: Guadlupe Spanish M.D.   On: 09/10/2020 16:37   MR BRAIN WO CONTRAST  Result Date: 09/10/2020 CLINICAL DATA:  ICA occlusion, code stroke follow-up EXAM: MRI HEAD WITHOUT CONTRAST TECHNIQUE: Multiplanar, multiecho pulse sequences of the brain and surrounding structures were obtained without intravenous contrast. COMPARISON:  Prior CT FINDINGS: Brain: There is diffusion hyperintensity in the right parietal lobe corresponding to infarct on CT. There is mixed ADC including hypointensity. Few additional punctate foci of mildly reduced diffusion in the right centrum semiovale. There is no evidence of intracranial hemorrhage. There is no intracranial mass or significant mass effect. There is no hydrocephalus or extra-axial fluid collection. Prominence of the ventricles and sulci reflects generalized parenchymal volume loss. Other patchy foci of T2 hyperintensity in the supratentorial white  matter are nonspecific  but may reflect mild chronic microvascular ischemic changes. Vascular: Loss of the flow void of the visualized cervical right ICA and proximal intracranial portion corresponding to findings on CTA. Skull and upper cervical spine: Normal marrow signal is preserved. Sinuses/Orbits: Paranasal sinus mucosal thickening with maxillary sinus air-fluid levels. Orbits are unremarkable. Other: Sella is unremarkable.  Mastoid air cells are clear. 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 No evidence of hemorrhage. Electronically Signed   By: Guadlupe Spanish M.D.   On: 09/10/2020 19:08   CT CEREBRAL PERFUSION W CONTRAST  Result Date: 09/11/2020 CLINICAL DATA:  Progressive left-sided weakness. EXAM: CT ANGIOGRAPHY HEAD AND NECK CT PERFUSION BRAIN TECHNIQUE: Multidetector CT imaging of the head and neck was performed using the standard protocol during bolus administration of intravenous contrast. Multiplanar CT image reconstructions and MIPs were obtained to evaluate the vascular anatomy. Carotid stenosis measurements (when applicable) are obtained utilizing NASCET criteria, using the distal internal carotid diameter as the denominator. Multiphase CT imaging of the brain was performed following IV bolus contrast injection. Subsequent parametric perfusion maps were calculated using RAPID software. CONTRAST:  OMNIPAQUE IOHEXOL 350 MG/ML SOLN COMPARISON:  MR head without contrast, CT head without contrast, and CTA head and neck 09/10/2020. FINDINGS: CTA NECK FINDINGS Aortic arch: No significant interval change in the 3 vessel aortic arch with atherosclerotic calcifications. Right carotid system: The right common carotid artery is unremarkable. The right ICA is occluded at the bifurcation without significant reconstitution in the neck. Left carotid system: The left common carotid  artery is within normal limits. Dense calcifications are present at the proximal ICA without a significant stenosis relative to the more distal vessel. There is mild tortuosity without significant stenosis in the cervical ICA. Vertebral arteries: Left vertebral artery is dominant vessel. Both vertebral arteries originate from the subclavian arteries without significant stenosis. No significant stenosis is present in either vertebral artery in the neck. Skeleton: Multilevel degenerative changes are present. Anterior osteophytes are fused at C3-4. No significant interval change. Other neck: No acute abnormality or significant interval change. Upper chest: Lung apices are clear.  Thoracic inlet is normal. Review of the MIP images confirms the above findings CTA HEAD FINDINGS Anterior circulation: The right internal carotid artery remains occluded through the ophthalmic segment. Atherosclerotic changes are present the cavernous left ICA without significant stenosis through the ICA terminus. The left A1 segment and right A1 segment are similar the prior exam. Proximal right M1 segment narrowing has progressed. Is some irregularity in the left M1 segment without significant stenosis. There is significant loss of perfusion right MCA branch vessels compared to prior exam. Left MCA branch vessels remain intact. ACA vessels are unchanged. Posterior circulation: Moderate narrowing is present at the dural margin of the right vertebral artery, progressive from the prior exam. Left vertebral artery is unchanged. Vertebrobasilar junction normal. The basilar artery is small. Both posterior cerebral arteries originate from the basilar tip. Prominent right posterior communicating artery is present. There is segmental attenuation PCA branch vessels similar the prior study. Venous sinuses: Dural sinuses are patent. Straight sinus and deep cerebral veins are intact. Anatomic variants: None Review of the MIP images confirms the above  findings CT Brain Perfusion Findings: ASPECTS: 10/10 CBF (<30%) Volume: 0mL Perfusion (Tmax>6.0s) volume: Mismatch Volume: Infarction Location:Right MCA territory IMPRESSION: 1. 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 2. 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 3. Progressive proximal right M1 segment stenosis with significant loss of perfusion in the right MCA branch vessels. 4. Stable atherosclerotic changes at the left carotid bifurcation without significant stenosis. 5. 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 Basilar artery is similar the prior exam. 6.  Aortic Atherosclerosis (ICD10-I70.0). Electronically Signed   By: Marin Robertshristopher  Mattern M.D.   On: 09/11/2020 20:25   ECHOCARDIOGRAM COMPLETE  Result Date: 09/12/2020    ECHOCARDIOGRAM REPORT   Patient Name:   [name] Date of Exam: 09/12/2020 Medical Rec #:  [id number]   Height:       70.0 in Accession #:    [id number]  Weight:       210.0 lb Date of Birth:  [birth year]  BSA:          2.131 m Patient Age:    74 years    BP:           99/45 mmHg Patient Gender: F           HR:           36 bpm. Exam Location:  Inpatient Procedure: 2D Echo, Cardiac Doppler and Color Doppler Indications:    TIA  History:        Patient has no prior history of Echocardiogram examinations.                 CHF, COPD; Risk Factors:Hypertension and Diabetes.  Sonographer:    Neomia DearAMARA CROWN RDCS Referring Phys: [id number] PARDEEP ZHYQMKUMAR  Sonographer Comments: No cardiac surgery noted in chart. IMPRESSIONS  1. Left ventricular ejection fraction, by estimation, is 60 to 65%. The left ventricle has normal function. The left ventricle has no regional wall motion abnormalities. There is mild left ventricular hypertrophy. Left ventricular diastolic parameters are consistent with Grade I diastolic dysfunction (impaired relaxation).  2. Right  ventricular systolic function is normal. The right ventricular size is normal. Tricuspid regurgitation signal is inadequate for assessing PA pressure.  3. Left atrial size was moderately dilated.  4. Right atrial size was mildly dilated.  5. The mitral valve is normal in structure. Trivial mitral valve regurgitation. No evidence of mitral stenosis.  6. The aortic valve is grossly normal. There is mild calcification of the aortic valve. Aortic valve regurgitation is not visualized. No aortic stenosis is present.  7. Aortic dilatation noted. There is borderline dilatation of the ascending aorta, measuring 39 mm.  8. The inferior vena cava is normal in size with greater than 50% respiratory variability, suggesting right atrial pressure of 3 mmHg. Conclusion(s)/Recommendation(s): No intracardiac source of embolism detected on this transthoracic study. A transesophageal echocardiogram is recommended to exclude cardiac source of embolism if clinically indicated. FINDINGS  Left Ventricle: Left ventricular ejection fraction, by estimation, is 60 to 65%. The left ventricle has normal function. The left ventricle has no regional wall motion abnormalities. The left ventricular internal cavity size was normal in size. There is  mild left ventricular hypertrophy. Left ventricular diastolic parameters are consistent with Grade I diastolic dysfunction (impaired relaxation). Right Ventricle: The right ventricular size is normal. No increase in right ventricular wall thickness. Right ventricular systolic function is normal. Tricuspid regurgitation signal is inadequate for assessing PA pressure. Left Atrium: Left atrial size was moderately dilated. Right Atrium: Right atrial size was mildly dilated. Pericardium: There is no evidence of pericardial effusion. Mitral Valve: The mitral valve is normal  in structure. Trivial mitral valve regurgitation. No evidence of mitral valve stenosis. Tricuspid Valve: The tricuspid valve is normal in  structure. Tricuspid valve regurgitation is not demonstrated. No evidence of tricuspid stenosis. Aortic Valve: The aortic valve is grossly normal. There is mild calcification of the aortic valve. Aortic valve regurgitation is not visualized. No aortic stenosis is present. Aortic valve mean gradient measures 4.0 mmHg. Aortic valve peak gradient measures 9.5 mmHg. Aortic valve area, by VTI measures 3.12 cm. Pulmonic Valve: The pulmonic valve was normal in structure. Pulmonic valve regurgitation is trivial. No evidence of pulmonic stenosis. Aorta: Aortic dilatation noted. There is borderline dilatation of the ascending aorta, measuring 39 mm. Venous: The inferior vena cava is normal in size with greater than 50% respiratory variability, suggesting right atrial pressure of 3 mmHg. IAS/Shunts: No atrial level shunt detected by color flow Doppler. EKG: Rhythm strip during this exam demostrated sinus bradycardia.  LEFT VENTRICLE PLAX 2D LVIDd:         5.20 cm      Diastology LV PW:         1.20 cm      LV e' medial:    5.11 cm/s LV IVS:        1.30 cm      LV E/e' medial:  15.2 LVOT diam:     2.30 cm      LV e' lateral:   6.57 cm/s LV SV:         115          LV E/e' lateral: 11.9 LV SV Index:   54 LVOT Area:     4.15 cm  LV Volumes (MOD) LV vol d, MOD A2C: 63.5 ml LV vol d, MOD A4C: 130.0 ml LV vol s, MOD A2C: 27.5 ml LV vol s, MOD A4C: 27.9 ml LV SV MOD A2C:     36.0 ml LV SV MOD A4C:     130.0 ml LV SV MOD BP:      68.8 ml RIGHT VENTRICLE RV S prime:     16.30 cm/s  PULMONARY VEINS TAPSE (M-mode): 3.0 cm      A Reversal Duration: 113.00 msec                             A Reversal Velocity: 32.80 cm/s                             Diastolic Velocity:  39.40 cm/s                             S/D Velocity:        1.40                             Systolic Velocity:   55.00 cm/s LEFT ATRIUM             Index LA diam:        4.30 cm 2.02 cm/m LA Vol (A2C):   95.6 ml 44.86 ml/m LA Vol (A4C):   80.7 ml 37.87 ml/m LA Biplane  Vol: 90.0 ml 42.23 ml/m  AORTIC VALVE                   PULMONIC VALVE AV Area (Vmax):    3.26 cm  PV Vmax:       1.03 m/s AV Area (Vmean):   3.10 cm    PV Vmean:      68.500 cm/s AV Area (VTI):     3.12 cm    PV VTI:        0.305 m AV Vmax:           154.00 cm/s PV Peak grad:  4.2 mmHg AV Vmean:          94.300 cm/s PV Mean grad:  2.0 mmHg AV VTI:            0.367 m AV Peak Grad:      9.5 mmHg AV Mean Grad:      4.0 mmHg LVOT Vmax:         121.00 cm/s LVOT Vmean:        70.300 cm/s LVOT VTI:          0.276 m LVOT/AV VTI ratio: 0.75  AORTA Ao Root diam: 3.40 cm Ao Asc diam:  3.90 cm MITRAL VALVE MV Area (PHT): 2.48 cm    SHUNTS MV Decel Time: 306 msec    Systemic VTI:  0.28 m MV E velocity: 77.90 cm/s  Systemic Diam: 2.30 cm MV A velocity: 94.00 cm/s MV E/A ratio:  0.83 Weston Brass MD Electronically signed by Weston Brass MD Signature Date/Time: 09/12/2020/4:17:21 PM    Final    CT HEAD CODE STROKE WO CONTRAST  Result Date: 09/11/2020 CLINICAL DATA:  Code stroke. Neuro deficit. Acute stroke suspected. Dizziness and unsteady gait for 2 weeks. EXAM: CT HEAD WITHOUT CONTRAST TECHNIQUE: Contiguous axial images were obtained from the base of the skull through the vertex without intravenous contrast. COMPARISON:  CT head without contrast and CTA head and neck 09/10/2020. FINDINGS: Brain: Right parietal acute/subacute infarct stable. No new infarct present. Basal ganglia are intact. Insular ribbon normal. The ventricles are of normal size. No significant extraaxial fluid collection is present. Vascular: Atherosclerotic calcifications are present within the cavernous internal carotid arteries bilaterally. No hyperdense vessel is present. Skull: Calvarium is intact. No focal lytic or blastic lesions are present. Sinuses/Orbits: Bilateral maxillary sinus fluid collections are present. Scattered ethmoid opacification noted. Sphenoid sinuses are clear. Frontal sinuses demonstrate mild mucosal thickening. Mastoid  air cells are clear. The globes and orbits are within normal limits. ASPECTS [facility] Stroke Program Early CT Score) - Ganglionic level infarction (caudate, lentiform nuclei, internal capsule, insula, M1-M3 cortex): 7/7 - Supraganglionic infarction (M4-M6 cortex): 3/3 Total score (0-10 with 10 being normal): 10/10 IMPRESSION: 1. Stable appearance of right parietal acute/subacute infarct. 2. No new infarct. 3. ASPECTS is 10/10. Electronically Signed   By: Marin Roberts M.D.   On: 09/11/2020 20:00   CT HEAD CODE STROKE WO CONTRAST  Result Date: 09/10/2020 CLINICAL DATA:  Code stroke.  Acute neuro deficit EXAM: CT HEAD WITHOUT CONTRAST TECHNIQUE: Contiguous axial images were obtained from the base of the skull through the vertex without intravenous contrast. COMPARISON:  None. FINDINGS: Brain: Ventricle size normal. Mild atrophy. Chronic infarct right parietal lobe. Mild white matter hypodensity bilaterally most consistent with microvascular ischemia Vascular: Negative for hyperdense vessel Skull: Negative Sinuses/Orbits: Mucosal edema in the paranasal sinuses. Air-fluid levels in both maxillary sinus. Mastoid clear bilaterally. Other: None ASPECTS ([place] Stroke Program Early CT Score) - Ganglionic level infarction (caudate, lentiform nuclei, internal capsule, insula, M1-M3 cortex): 7 - Supraganglionic infarction (M4-M6 cortex): 3 Total score (0-10 with 10 being normal): 10 IMPRESSION: 1. No acute abnormality. 2.  Chronic infarct right parietal lobe. Microvascular ischemic change in the white matter 3. ASPECTS is 10 4. These results were called by telephone at the time of interpretation on 09/10/2020 at 4:07 pm to provider Effie Shy , who verbally acknowledged these results. Electronically Signed   By: Marlan Palau M.D.   On: 09/10/2020 16:08   CT ANGIO HEAD NECK W WO CM W PERF  Result Date: 09/11/2020 CLINICAL DATA:  Progressive left-sided weakness. EXAM: CT ANGIOGRAPHY HEAD AND NECK CT PERFUSION BRAIN  TECHNIQUE: Multidetector CT imaging of the head and neck was performed using the standard protocol during bolus administration of intravenous contrast. Multiplanar CT image reconstructions and MIPs were obtained to evaluate the vascular anatomy. Carotid stenosis measurements (when applicable) are obtained utilizing NASCET criteria, using the distal internal carotid diameter as the denominator. Multiphase CT imaging of the brain was performed following IV bolus contrast injection. Subsequent parametric perfusion maps were calculated using RAPID software. CONTRAST:  OMNIPAQUE IOHEXOL 350 MG/ML SOLN COMPARISON:  MR head without contrast, CT head without contrast, and CTA head and neck 09/10/2020. FINDINGS: CTA NECK FINDINGS Aortic arch: No significant interval change in the 3 vessel aortic arch with atherosclerotic calcifications. Right carotid system: The right common carotid artery is unremarkable. The right ICA is occluded at the bifurcation without significant reconstitution in the neck. Left carotid system: The left common carotid artery is within normal limits. Dense calcifications are present at the proximal ICA without a significant stenosis relative to the more distal vessel. There is mild tortuosity without significant stenosis in the cervical ICA. Vertebral arteries: Left vertebral artery is dominant vessel. Both vertebral arteries originate from the subclavian arteries without significant stenosis. No significant stenosis is present in either vertebral artery in the neck. Skeleton: Multilevel degenerative changes are present. Anterior osteophytes are fused at C3-4. No significant interval change. Other neck: No acute abnormality or significant interval change. Upper chest: Lung apices are clear.  Thoracic inlet is normal. Review of the MIP images confirms the above findings CTA HEAD FINDINGS Anterior circulation: The right internal carotid artery remains occluded through the ophthalmic segment.  Atherosclerotic changes are present the cavernous left ICA without significant stenosis through the ICA terminus. The left A1 segment and right A1 segment are similar the prior exam. Proximal right M1 segment narrowing has progressed. Is some irregularity in the left M1 segment without significant stenosis. There is significant loss of perfusion right MCA branch vessels compared to prior exam. Left MCA branch vessels remain intact. ACA vessels are unchanged. Posterior circulation: Moderate narrowing is present at the dural margin of the right vertebral artery, progressive from the prior exam. Left vertebral artery is unchanged. Vertebrobasilar junction normal. The basilar artery is small. Both posterior cerebral arteries originate from the basilar tip. Prominent right posterior communicating artery is present. There is segmental attenuation PCA branch vessels similar the prior study. Venous sinuses: Dural sinuses are patent. Straight sinus and deep cerebral veins are intact. Anatomic variants: None Review of the MIP images confirms the above findings CT Brain Perfusion Findings: ASPECTS: 10/10 CBF (<30%) Volume: 0mL Perfusion (Tmax>6.0s) volume: Mismatch Volume: Infarction Location:Right MCA territory IMPRESSION: 1. 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 2. 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 3. Progressive proximal right M1 segment stenosis with significant loss of perfusion in the right MCA branch vessels. 4. Stable atherosclerotic changes at the left carotid bifurcation without significant stenosis. 5. 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 Basilar artery is similar the prior exam. 6.  Aortic Atherosclerosis (ICD10-I70.0). Electronically Signed   By: Marin Roberts M.D.   On: 09/11/2020 20:25    Labs:  CBC: Recent Labs    09/10/20 1540  09/10/20 1559 09/12/20 0025 09/13/20 0312  WBC 4.4  --  4.3 6.9  HGB 12.2 12.6 12.0 11.0*  HCT 38.3 37.0 37.4 33.4*  PLT 141*  --  156 145*    COAGS: Recent Labs    09/10/20 1540  INR 1.0  APTT 26    BMP: Recent Labs    09/10/20 1540 09/10/20 1559 09/13/20 0312  NA 141 143 138  K 3.3* 3.7 2.8*  CL 106 105 103  CO2 26  --  27  GLUCOSE 88 84 87  BUN 18 20 10   CALCIUM 8.7*  --  8.8*  CREATININE 0.71 0.90 0.77  GFRNONAA >60  --  >60    LIVER FUNCTION TESTS: Recent Labs    09/10/20 1540 09/13/20 0312  BILITOT 0.4 0.2*  AST 17 16  ALT 8 8  ALKPHOS 67 60  PROT 7.1 6.6  ALBUMIN 3.6 3.2*    Assessment and Plan:  CVA R ICA mechanical thrombectomy- revascularization in IR 5/7 Plan per Neuro  Electronically Signed: 7/7, PA-C 09/13/2020, 9:18 AM   I spent a total of 15 Minutes at the the patient's bedside AND on the patient's hospital floor or unit, greater than 50% of which was counseling/coordinating care for R ICA revasc

## 2020-09-13 NOTE — Progress Notes (Addendum)
STROKE TEAM PROGRESS NOTE   HISTORY OF PRESENT ILLNESS (per record) [name] is a 75 y.o. female with a medical history significant for HTN, COPD, aortic aneurysm, DVT in pregnancy, remote smoking history, non-adherence to medications for the past year, and a remote MI who presented to [facility] 5/5 for evaluation of feeling dizzy and off balance for 2 - 3 weeks causing her to run into walls and bump her head.   Per Dr. Rollene Fare initial evaluation 5/5: "She recently moved from [place] so much of her care is outside of our system. She reports she has been feeling dizzy/off balance for 2 to 3 weeks. She has run into walls 2-3 times in the past week usually with a doorway towards the right and the wall on the left. She has also been having some intermittent left-sided weakness that has been coming and going. This morning she woke up particularly dizzy and off balance. EMS was activated around 1 PM but she refused transport initially. Family then convinced her to come to the hospital for further evaluation and therefore she presented around 3:30 PM. Initially she was neuro intact but then while in triage was noted to have developed a right-MCA syndrome for which code stroke was activated given she had been initially grossly nonfocal on triage exam.  LKW:1-2 weeks tpa given?: No,due to out of the window IR Thrombectomy? No,due to out of the window Modified Rankin Scale: 0-Completely asymptomatic and back to baseline post- stroke Time of teleneurologist evaluation:4:05 PM"  She was transferred to [facility] for further stroke evaluation and for close neuro monitoring with low threshold for stroke activation with further neurologic deficits. She does endorse headaches recently she feels are coming from her right ear that radiate to her right forehead and face. She states her last headache was yesterday 5/5 and denies current headache.    INTERVAL HISTORY History appears to be doing well at this  time status post thrombectomy.    She is resting well.  Blood pressures are fine.    OBJECTIVE Vitals:   09/13/20 0630 09/13/20 0645 09/13/20 0700 09/13/20 0800  BP: (!) 131/44 (!) 134/55 (!) 135/55   Pulse: (!) 38 (!) 38 (!) 40   Resp: 18 18 18    Temp:    98.6 F (37 C)  TempSrc:    Axillary  SpO2: 99% 99% 98%   Weight:      Height:        CBC:  Recent Labs  Lab 09/10/20 1540 09/10/20 1559 09/12/20 0025 09/13/20 0312  WBC 4.4  --  4.3 6.9  NEUTROABS 1.9  --   --   --   HGB 12.2   < > 12.0 11.0*  HCT 38.3   < > 37.4 33.4*  MCV 90.5  --  89.9 90.3  PLT 141*  --  156 145*   < > = values in this interval not displayed.    Basic Metabolic Panel:  Recent Labs  Lab 09/10/20 1540 09/10/20 1559 09/13/20 0312  NA 141 143 138  K 3.3* 3.7 2.8*  CL 106 105 103  CO2 26  --  27  GLUCOSE 88 84 87  BUN 18 20 10   CREATININE 0.71 0.90 0.77  CALCIUM 8.7*  --  8.8*  MG  --   --  1.9    Lipid Panel:     Component Value Date/Time   CHOL 230 (H) 09/12/2020 0025   TRIG 83 09/12/2020 0025   HDL 53  09/12/2020 0025   CHOLHDL 4.3 09/12/2020 0025   VLDL 17 09/12/2020 0025   LDLCALC 160 (H) 09/12/2020 0025   HgbA1c:  Lab Results  Component Value Date   HGBA1C 5.3 09/11/2020   Urine Drug Screen: No results found for: LABOPIA, COCAINSCRNUR, LABBENZ, AMPHETMU, THCU, LABBARB  Alcohol Level     Component Value Date/Time   ETH <10 09/10/2020 1540    IMAGING  CT HEAD CODE STROKE WO CONTRAST 09/11/2020 IMPRESSION:  1. Stable appearance of right parietal acute/subacute infarct.  2. No new infarct.  3. ASPECTS is 10/10.   CT ANGIO HEAD NECK W WO CM W PERF CT CEREBRAL PERFUSION W CONTRAST 09/11/2020 IMPRESSION:  1. 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  2. 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  3. Progressive proximal right M1 segment stenosis with significant loss of  perfusion in the right MCA branch vessels.  4. Stable atherosclerotic changes at the left carotid bifurcation without significant stenosis.  5. 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 Basilar artery is similar the prior exam.  6.  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 No evidence of hemorrhage.   ECHOCARDIOGRAM COMPLETE 09/12/2020 IMPRESSIONS   1. Left ventricular ejection fraction, by estimation, is 60 to 65%. The left ventricle has normal function. The left ventricle has no regional wall motion abnormalities. There is mild left ventricular hypertrophy. Left ventricular diastolic parameters are consistent with Grade I diastolic dysfunction (impaired relaxation).   2. Right ventricular systolic function is normal. The right ventricular size is normal. Tricuspid regurgitation signal is inadequate for assessing PA pressure.   3. Left atrial size was moderately dilated.   4. Right atrial size was mildly dilated.   5. The mitral valve is normal in structure. Trivial mitral valve regurgitation. No evidence of mitral stenosis.   6. The aortic valve is grossly normal. There is mild calcification of the aortic valve. Aortic valve regurgitation is not visualized. No aortic stenosis is present.   7. Aortic dilatation noted. There is borderline dilatation of the ascending aorta, measuring 39 mm.   8. The inferior vena cava is normal in size with greater than 50% respiratory variability, suggesting right atrial pressure of 3 mmHg.  Conclusion(s)/Recommendation(s): No intracardiac source of embolism detected on this transthoracic study. A transesophageal echocardiogram is recommended to exclude cardiac source of embolism if clinically indicated.    ECG - SB rate 57 BPM.  (See cardiology reading for complete details)  PHYSICAL EXAM Blood pressure (!) 135/55, pulse (!) 40, temperature 98.6 F (37 C), temperature source Axillary, resp. rate 18, height 5\' 10"  (1.778 m), weight 95.3 kg, SpO2 98 %.  GENERAL: She is resting well but responsive.  HEENT: Neck is supple no trauma noted.  EXTREMITIES: No edema   BACK: Normal  SKIN: Normal by inspection.    MENTAL STATUS: She is drowsy but easily arousable to verbal commands.  She is oriented to hospital, year, month and medical condition.  She follows commands well.  CRANIAL NERVES: Pupils are equal, round and reactive to light and accomodation; extra ocular movements are full, there is no significant nystagmus; visual fields are full; upper and lower facial muscles are normal in strength and symmetric, there is no flattening  of the nasolabial folds; tongue is midline; uvula is midline; shoulder elevation is normal.  MOTOR:  Right side is graded as 4+/5.  Left upper extremity 4 - and left leg 3.  There is mild drift of the left arm and left leg.  COORDINATION: Left finger to nose is normal, right finger to nose is normal, No rest tremor; no intention tremor; no postural tremor; no bradykinesia.    ASSESSMENT/PLAN Ms. Anne ShutterJoyce Calise is a 75 y.o. female with history of HTN, COPD, aortic aneurysm, DVT in pregnancy, remote smoking history, non-adherence to medications for the past year, and a remote MI who [facility] for evaluation of feeling dizzy, recent headaches and off balance for 2 - 3 weeks causing her to run into walls and bump her head.  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 Mechanical thrombectomy performed with combined stent retriever (embotrap) and direct contact aspiration with complete recanalization (TICI3) 09/11/2020  - Dr.  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 (consider TEE)  Resultant left-sided weakness that is mild.  Code Stroke CT Head - Stable appearance of right parietal acute/subacute infarct. No new infarct. ASPECTS is 10/10.   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 Progressive proximal right M1 segment stenosis with significant loss of perfusion in the right MCA branch vessels. 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 Basilar artery is similar the prior exam.   CT Perfusion - Right MCA territory ischemia with 162 mL of surrounding penumbra.    Carotid Doppler - pending  2D Echo - EF 60 - 65%. No cardiac source of emboli identified.   [employer] Corona Virus 2 - negative  LDL - 160  HgbA1c - 5.3  UDS - not ordered  VTE prophylaxis - SCDs Diet  Diet Order            Diet NPO time specified  Diet effective now                 No antithrombotic prior to admission, now on aspirin 81 mg daily and clopidogrel 75 mg daily   Patient will be counseled to be compliant with her antithrombotic medications  Ongoing aggressive stroke risk factor management  Therapy recommendations:  CIR recommended - Rehabilitation MD consult ordered  Disposition:  Pending  Hypertension  Home BP meds: none   Current BP meds: Zestril ; Cleviprex and hydralazine prn  Stable  SBP 160 mmHg. Marland Kitchen. Long-term BP goal normotensive  Hyperlipidemia  Home Lipid lowering medication: none   LDL 160, goal < 70  Current lipid lowering medication: Lipitor 80 mg daily   Continue statin at discharge  Other Stroke Risk Factors  Advanced  age  Former cigarette smoker - quit  Obesity, Body mass index is 30.13 kg/m., recommend weight loss, diet and exercise as appropriate   Coronary artery disease  Medical non compliance  Other Active Problems, Findings, Recommendations and/or Plan  Code status - Full code  Aortic Atherosclerosis (  ICD10-I70.0)  Bradycardia - 30's - 40's   Maribeth Jiles is an 75 y.o. female.   ASSESSMENT/PLAN: 1.  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  Status post thrombectomy at both the ICA and MCA levels with good recanalization.  Risk factors hypertension and age.  Repeat imaging as per protocol.   Dual antiplatelet agents are recommended for 3 months.  A statin is also recommended along with blood pressure control.    This patient is critically ill due to stroke post procedure and at significant risk of neurological worsening, death form severe anemia, bleeding, recurrent stroke, intracranial stenosis. This patient's care requires constant monitoring of vital signs, hemodynamics, respiratory and cardiac monitoring, review of multiple databases, neurological assessment, other specialists and medical decision making of high complexity. I spent39minutes of neurocritical care time in the care of this patient  Hospital day # 3    To contact Stroke Continuity provider, please refer to [email or web address]. After hours, contact General Neurology

## 2020-09-13 NOTE — Consult Note (Signed)
Physical Medicine and Rehabilitation Consult Reason for Consult: Dizziness with unsteady gait Referring Physician: DrChiu   HPI: [name] is a 75 y.o. right-handed female with history of COPD, DVT during pregnancy, hypertension, thoracicoabdominal aortic aneurysm, quit smoking 33 years ago question medical noncompliance.  Per chart review patient lives with her family.  Independent prior to admission.  Two-level home bed and bath upstairs.  Independent ADLs.  She does not drive.  Presented 09/10/2020 with dizziness and unsteady gait that has persisted over 2 to 3 weeks as well as intermittent left-sided weakness.  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  No evidence of hemorrhage.  CT angiogram of head and neck age-indeterminate occlusion of the right cervical ICA just beyond the origin.  At least partial reconstitution at the clinoid intracranially.  Less than 50% stenosis of the left ICA origin.  Admission chemistries unremarkable except potassium 3.3.  Patient underwent right ICA mechanical thrombectomy revascularization per interventional radiology 09/12/2020.  Currently maintained on DAPT aspirin 81 mg daily and Plavix 75 mg daily for CVA prophylaxis x3 months.  Cleviprex initially added for blood pressure control.  Tolerating a regular consistency diet.  Due to patient's left-sided weakness and decreased functional mobility recommendations of physical medicine rehab consult.   Review of Systems  Constitutional: Negative for chills and fever.  HENT: Negative for hearing loss.   Eyes: Negative for blurred vision and double vision.  Respiratory:       Occasional shortness of breath with exertion  Cardiovascular: Negative for chest pain, palpitations and leg swelling.  Gastrointestinal: Negative for constipation, heartburn and nausea.  Genitourinary: Negative for  dysuria, flank pain and hematuria.  Musculoskeletal: Positive for joint pain and myalgias.  Skin: Negative for rash.  Neurological: Positive for dizziness, weakness and headaches.  All other systems reviewed and are negative.  Past Medical History:  Diagnosis Date  . COPD (chronic obstructive pulmonary disease) (HCC)   . History of DVT (deep vein thrombosis)   . Hypertension   . Thoracoabdominal aortic aneurysm [facility])    Past Surgical History:  Procedure Laterality Date  . IR PERCUTANEOUS ART THROMBECTOMY/INFUSION INTRACRANIAL INC DIAG ANGIO  09/11/2020      . NO PAST SURGERIES     History reviewed. No pertinent family history. Social History:  reports that she quit smoking about 33 years ago. She has never used smokeless tobacco. She reports that she does not drink alcohol and does not use drugs. Allergies: No Known Allergies Medications Prior to Admission  Medication Sig Dispense Refill  . ibuprofen (ADVIL) 200 MG tablet Take 200 mg by mouth every 6 (six) hours as needed for headache.      Home: Home Living Family/patient expects to be discharged to:: Private residence Living Arrangements: Children Available Help at Discharge: Family,Available 24 hours/day Type of Home: Apartment Home Access: Stairs to enter Entrance Stairs-Number of Steps: 2 Entrance Stairs-Rails: None Home Layout: Two level,Bed/bath upstairs Alternate Level Stairs-Number of Steps: flight Alternate Level Stairs-Rails: Left Bathroom Shower/Tub: Engineer, manufacturing systems: Standard Home Equipment: None  Functional History: Prior Function Level of Independence: Independent Comments: Independent with ADLs/IADLs. Does not drive at baseline. Functional Status:  Mobility: Bed Mobility Overal bed mobility: Needs Assistance Bed Mobility: Supine to Sit Supine to sit: Mod assist,HOB elevated General bed mobility comments: Assist for trunk and to continue bringing BLEs over to EOB. Transfers Overall  transfer level: Needs assistance Equipment used: 2 person hand held assist Transfers: Sit to/from Stand,Lateral/Scoot Transfers Sit to Stand: Mod assist,+2 physical assistance,From elevated surface  Lateral/Scoot Transfers: Min guard General transfer comment: ModA +2 for taking steps to [facility], pt abruptly sitting down and then asked to take more steps to [facility] with ModA +2. Pt laterally scooting to [facility], but moving very little without assist. Ambulation/Gait Ambulation/Gait assistance: Mod assist,+2 physical assistance Gait Distance (Feet): 3 Feet Assistive device: 2 person hand held assist Gait Pattern/deviations: Step-to pattern,Decreased stride length,Shuffle General Gait Details: pt with short lateral steps along EOB, minimal clearance bilaterally with increased UE support to steady and maintain balance. minA to facilitate wt shift for stepping. Gait velocity: decr    ADL: ADL Overall ADL's : Needs assistance/impaired Eating/Feeding: NPO Grooming: Minimal assistance,Sitting Upper Body Bathing: Minimal assistance,Sitting Lower Body Bathing: Maximal assistance,+2 for physical assistance,+2 for safety/equipment,Cueing for safety,Cueing for sequencing,Sitting/lateral leans,Sit to/from stand Upper Body Dressing : Minimal assistance,Sitting Lower Body Dressing: Maximal assistance,+2 for physical assistance,+2 for safety/equipment,Cueing for safety Lower Body Dressing Details (indicate cue type and reason): attempting to don/dof socks, but requiring assist for figure 4 to stay as BLEs kept moving instead of stayin due to gravity . Toilet Transfer: Moderate assistance,+2 for physical assistance,+2 for safety/equipment,Stand-pivot Toilet Transfer Details (indicate cue type and reason): +2 hand held assist up to [facility], pt abruptly sat EOB and wanting to lie down prior to OT/PT ready for pt to sit. Toileting- Clothing Manipulation and Hygiene: Moderate assistance,+2 for physical assistance,+2 for  safety/equipment,Sit to/from stand Functional mobility during ADLs: Moderate assistance,+2 for physical assistance,Cueing for safety,Cueing for sequencing General ADL Comments: Patient limited by L-sided weakness, L inattention and decreased initiation with cognitive deficits.  Cognition: Cognition Overall Cognitive Status: No family/caregiver present to determine baseline cognitive functioning Orientation Level: Oriented X4 Cognition Arousal/Alertness: Lethargic Behavior During Therapy: Flat affect Overall Cognitive Status: No family/caregiver present to determine baseline cognitive functioning Area of Impairment: Attention,Memory,Following commands,Safety/judgement,Problem solving,Awareness Current Attention Level: Selective Memory: Decreased short-term memory Following Commands: Follows one step commands with increased time Safety/Judgement: Decreased awareness of deficits,Decreased awareness of safety Awareness: Emergent Problem Solving: Requires verbal cues,Requires tactile cues,Slow processing,Decreased initiation General Comments: Pt appears HOH and often needs directions repeated. Pt currently with deficits in multi step commands and problem solving. Pt slow to respon and initiate.  Blood pressure (!) 142/48, pulse (!) 35, temperature 98 F (36.7 C), temperature source Axillary, resp. rate 18, height 5\' 10"  (1.778 m), weight 95.3 kg, SpO2 99 %. Physical Exam Vitals and nursing note reviewed.  Constitutional:      Appearance: She is obese.  HENT:     Head: Normocephalic and atraumatic.  Eyes:     General:        Right eye: No discharge.        Left eye: No discharge.     Extraocular Movements: Extraocular movements intact.     Pupils: Pupils are equal, round, and reactive to light.  Cardiovascular:     Rate and Rhythm: Normal rate and regular rhythm.     Heart sounds: Normal heart sounds. No murmur heard.   Pulmonary:     Effort: Pulmonary effort is normal. No  respiratory distress.     Breath sounds: Normal breath sounds. No stridor.  Abdominal:     General: Abdomen is flat. Bowel sounds are normal. There is no distension.     Palpations: Abdomen is soft.  Musculoskeletal:        General:  No tenderness.     Cervical back: No rigidity.     Comments: Right shoulder pain with abduction greater than 90.  No pain with hand or wrist range of motion no pain with elbow range of motion.  Left shoulder has pain-free range of motion No pain with lower extremity active assisted range of motion  Skin:    General: Skin is warm and dry.  Neurological:     Mental Status: She is oriented to person, place, and time.     Motor: No tremor or abnormal muscle tone.     Gait: Gait abnormal.     Comments: Patient is a bit lethargic but arousable.  Makes eye contact with examiner.  Provides name and age.  Follows simple commands. Motor strength is 4/5 bilateral deltoid bicep tricep grip hip flexion knee extension ankle dorsiflexion. Sensation intact to light touch bilateral upper and lower limbs Oriented to person and place but not time  Psychiatric:        Mood and Affect: Mood normal.        Behavior: Behavior normal.     Results for orders placed or performed during the hospital encounter of 09/10/20 (from the past 24 hour(s))  CBC     Status: Abnormal   Collection Time: 09/14/20  4:07 AM  Result Value Ref Range   WBC 6.0 4.0 - 10.5 K/uL   RBC 3.38 (L) 3.87 - 5.11 MIL/uL   Hemoglobin 9.9 (L) 12.0 - 15.0 g/dL   HCT 95.630.8 (L) 21.336.0 - 08.646.0 %   MCV 91.1 80.0 - 100.0 fL   MCH 29.3 26.0 - 34.0 pg   MCHC 32.1 30.0 - 36.0 g/dL   RDW 57.812.9 46.911.5 - 62.915.5 %   Platelets 123 (L) 150 - 400 K/uL   nRBC 0.0 0.0 - 0.2 %   ECHOCARDIOGRAM COMPLETE  Result Date: 09/12/2020    ECHOCARDIOGRAM REPORT   Patient Name:   [name] Date of Exam: 09/12/2020 Medical Rec #:  [id number]   Height:       70.0 in Accession #:    [id number]  Weight:       210.0 lb Date of Birth:  [birth year]   BSA:          2.131 m Patient Age:    74 years    BP:           99/45 mmHg Patient Gender: F           HR:           36 bpm. Exam Location:  Inpatient Procedure: 2D Echo, Cardiac Doppler and Color Doppler Indications:    TIA  History:        Patient has no prior history of Echocardiogram examinations.                 CHF, COPD; Risk Factors:Hypertension and Diabetes.  Sonographer:    Neomia DearAMARA CROWN RDCS Referring Phys: [id number] PARDEEP [id number]  Sonographer Comments: No cardiac surgery noted in chart. IMPRESSIONS  1. Left ventricular ejection fraction, by estimation, is 60 to 65%. The left ventricle has normal function. The left ventricle has no regional wall motion abnormalities. There is mild left ventricular hypertrophy. Left ventricular diastolic parameters are consistent with Grade I diastolic dysfunction (impaired relaxation).  2. Right ventricular systolic function is normal. The right ventricular size is normal. Tricuspid regurgitation signal is inadequate for assessing PA pressure.  3. Left atrial size was moderately dilated.  4. Right atrial size was  mildly dilated.  5. The mitral valve is normal in structure. Trivial mitral valve regurgitation. No evidence of mitral stenosis.  6. The aortic valve is grossly normal. There is mild calcification of the aortic valve. Aortic valve regurgitation is not visualized. No aortic stenosis is present.  7. Aortic dilatation noted. There is borderline dilatation of the ascending aorta, measuring 39 mm.  8. The inferior vena cava is normal in size with greater than 50% respiratory variability, suggesting right atrial pressure of 3 mmHg. Conclusion(s)/Recommendation(s): No intracardiac source of embolism detected on this transthoracic study. A transesophageal echocardiogram is recommended to exclude cardiac source of embolism if clinically indicated. FINDINGS  Left Ventricle: Left ventricular ejection fraction, by estimation, is 60 to 65%. The left ventricle has normal  function. The left ventricle has no regional wall motion abnormalities. The left ventricular internal cavity size was normal in size. There is  mild left ventricular hypertrophy. Left ventricular diastolic parameters are consistent with Grade I diastolic dysfunction (impaired relaxation). Right Ventricle: The right ventricular size is normal. No increase in right ventricular wall thickness. Right ventricular systolic function is normal. Tricuspid regurgitation signal is inadequate for assessing PA pressure. Left Atrium: Left atrial size was moderately dilated. Right Atrium: Right atrial size was mildly dilated. Pericardium: There is no evidence of pericardial effusion. Mitral Valve: The mitral valve is normal in structure. Trivial mitral valve regurgitation. No evidence of mitral valve stenosis. Tricuspid Valve: The tricuspid valve is normal in structure. Tricuspid valve regurgitation is not demonstrated. No evidence of tricuspid stenosis. Aortic Valve: The aortic valve is grossly normal. There is mild calcification of the aortic valve. Aortic valve regurgitation is not visualized. No aortic stenosis is present. Aortic valve mean gradient measures 4.0 mmHg. Aortic valve peak gradient measures 9.5 mmHg. Aortic valve area, by VTI measures 3.12 cm. Pulmonic Valve: The pulmonic valve was normal in structure. Pulmonic valve regurgitation is trivial. No evidence of pulmonic stenosis. Aorta: Aortic dilatation noted. There is borderline dilatation of the ascending aorta, measuring 39 mm. Venous: The inferior vena cava is normal in size with greater than 50% respiratory variability, suggesting right atrial pressure of 3 mmHg. IAS/Shunts: No atrial level shunt detected by color flow Doppler. EKG: Rhythm strip during this exam demostrated sinus bradycardia.  LEFT VENTRICLE PLAX 2D LVIDd:         5.20 cm      Diastology LV PW:         1.20 cm      LV e' medial:    5.11 cm/s LV IVS:        1.30 cm      LV E/e' medial:  15.2  LVOT diam:     2.30 cm      LV e' lateral:   6.57 cm/s LV SV:         115          LV E/e' lateral: 11.9 LV SV Index:   54 LVOT Area:     4.15 cm  LV Volumes (MOD) LV vol d, MOD A2C: 63.5 ml LV vol d, MOD A4C: 130.0 ml LV vol s, MOD A2C: 27.5 ml LV vol s, MOD A4C: 27.9 ml LV SV MOD A2C:     36.0 ml LV SV MOD A4C:     130.0 ml LV SV MOD BP:      68.8 ml RIGHT VENTRICLE RV S prime:     16.30 cm/s  PULMONARY VEINS TAPSE (M-mode): 3.0 cm  A Reversal Duration: 113.00 msec                             A Reversal Velocity: 32.80 cm/s                             Diastolic Velocity:  39.40 cm/s                             S/D Velocity:        1.40                             Systolic Velocity:   55.00 cm/s LEFT ATRIUM             Index LA diam:        4.30 cm 2.02 cm/m LA Vol (A2C):   95.6 ml 44.86 ml/m LA Vol (A4C):   80.7 ml 37.87 ml/m LA Biplane Vol: 90.0 ml 42.23 ml/m  AORTIC VALVE                   PULMONIC VALVE AV Area (Vmax):    3.26 cm    PV Vmax:       1.03 m/s AV Area (Vmean):   3.10 cm    PV Vmean:      68.500 cm/s AV Area (VTI):     3.12 cm    PV VTI:        0.305 m AV Vmax:           154.00 cm/s PV Peak grad:  4.2 mmHg AV Vmean:          94.300 cm/s PV Mean grad:  2.0 mmHg AV VTI:            0.367 m AV Peak Grad:      9.5 mmHg AV Mean Grad:      4.0 mmHg LVOT Vmax:         121.00 cm/s LVOT Vmean:        70.300 cm/s LVOT VTI:          0.276 m LVOT/AV VTI ratio: 0.75  AORTA Ao Root diam: 3.40 cm Ao Asc diam:  3.90 cm MITRAL VALVE MV Area (PHT): 2.48 cm    SHUNTS MV Decel Time: 306 msec    Systemic VTI:  0.28 m MV E velocity: 77.90 cm/s  Systemic Diam: 2.30 cm MV A velocity: 94.00 cm/s MV E/A ratio:  0.83 Weston Brass MD Electronically signed by Weston Brass MD Signature Date/Time: 09/12/2020/4:17:21 PM    Final      Assessment/Plan: Diagnosis: 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 Does the need for close, 24 hr/day medical supervision in concert with the  patient's rehab needs make it unreasonable for this patient to be served in a less intensive setting? Yes 2. Co-Morbidities requiring supervision/potential complications: COPD, hypertension, bradycardia 3. Due to bladder management, bowel management, safety, skin/wound care, disease management, medication administration, pain management and patient education, does the patient require 24 hr/day rehab nursing? Yes 4. Does the patient require coordinated care of a physician, rehab nurse, therapy disciplines of PT, OT, speech to address physical and functional deficits in the context of the above medical diagnosis(es)? Yes Addressing deficits in the following areas: balance, endurance, locomotion, strength, transferring, bowel/bladder  control, bathing, dressing, feeding, grooming, toileting, cognition, speech, language, swallowing and psychosocial support 5. Can the patient actively participate in an intensive therapy program of at least 3 hrs of therapy per day at least 5 days per week? Yes 6. The potential for patient to make measurable gains while on inpatient rehab is good 7. Anticipated functional outcomes upon discharge from inpatient rehab are supervision and min assist  with PT, supervision and min assist with OT, supervision with SLP. 8. Estimated rehab length of stay to reach the above functional goals is: 18 to 21 days 9. Anticipated discharge destination: Home 10. Overall Rehab/Functional Prognosis: good  RECOMMENDATIONS: This patient's condition is appropriate for continued rehabilitative care in the following setting: CIR Patient has agreed to participate in recommended program. Yes Note that insurance prior authorization may be required for reimbursement for recommended care.  Comment:  Needs to be off Cleviprex with blood pressures controlled on oral agents prior to CIR  Charlton Amor, PA-C 09/14/2020   "I have personally performed a face to face diagnostic evaluation of this  patient.  Additionally, I have reviewed and concur with the physician assistant's documentation above." Erick Colace M.D. [facility] Fellow Am Acad of [employer] Med and Rehab Diplomate Am Board of Electrodiagnostic Med Fellow Am Board of Interventional Pain

## 2020-09-13 NOTE — Evaluation (Signed)
Clinical/Bedside Swallow Evaluation Patient Details  Name: [name] MRN: [id number] Date of Birth: 06/12/1945  Today's Date: 09/13/2020 Time: SLP Start Time (ACUTE ONLY): 1140 SLP Stop Time (ACUTE ONLY): 1200 SLP Time Calculation (min) (ACUTE ONLY): 20 min  Past Medical History:  Past Medical History:  Diagnosis Date  . COPD (chronic obstructive pulmonary disease) (HCC)   . History of DVT (deep vein thrombosis)   . Hypertension   . Thoracoabdominal aortic aneurysm [facility])    Past Surgical History:  Past Surgical History:  Procedure Laterality Date  . IR PERCUTANEOUS ART THROMBECTOMY/INFUSION INTRACRANIAL INC DIAG ANGIO  09/11/2020      . NO PAST SURGERIES     HPI:  [name] is a 75 y.o. female with medical history significant of HTN, but not currently on any antihypertensive medications.  Patient reports she is from [place] and last PCP visit was more than a year ago and she has been without medication for that duration.  She complains of having dizziness and unsteady gait for 2 weeks which has been worse today so she came to the emergency room.  After she arrived in the ED she has developed left-sided weakness, left-sided facial droop and left neglect.  Stroke code was activated. Exact time of onset of symptoms unknown since patient has been having unsteady gait for few days, Patient also reports intermittent headache because of uncontrolled hypertension.  Her symptoms resolved within an hour.  Patient was seen by tele neurologist Dr. Iver Nestle, who recommended  stroke work-up, dual antiplatelet agents , aspirin and Plavix, statins.   Patient denies any recent travel, sick contacts, cough, weight loss, chest pain, shortness of breath.  Patient found to have acute and subacute right parietal stroke.  She is status post right ICA mechanical thrombectomy - revascularization in IR.   Assessment / Plan / Recommendation Clinical Impression  Clinical swallowing evaluation was completed using thin  liquids via spoon, cup and straw, pureed material and dry solids.  RN reported that the patient passed the [facility] Swallowing Protocol but she was still concerned for possible swallowing isuses.  The patient did not endorse any issues swallowing prior to admission.  Cranial nerve exam was completed and unremarkable.  Lingual, labial, facial and jaw range of motion and strength appeared adequate.  Facial sensation appeared to be intact and she did not endorse a difference in sensation from the right to left side of her face.  Patient presented wtih a mild oral dysphagia characterized by slow mastication of dry solids.  She was able to clear all material from her oral cavity.  Swallow trigger was appreciated to palpation and overt s/s of aspiration were not seen.  She was unable to continuously consume 3 oz of ice water.  She reported it was too cold.  She was able to serially drink about 2 ozs of it with no overt s/s of aspiration.  Recommend a regular diet with thin liquids.   She can likely take medication whole with liquids.  ST will follow briefly for therapeutic diet tolerance given new stroke.  Cognitive/language eval is also pending. SLP Visit Diagnosis: Dysphagia, oral phase (R13.11)    Aspiration Risk  Mild aspiration risk    Diet Recommendation   Regular with thin liquids  Medication Administration: Whole meds with liquid    Other  Recommendations Oral Care Recommendations: Oral care BID   Follow up Recommendations Other (comment) (TBD)      Frequency and Duration min 2x/week  2 weeks  Prognosis        Swallow Study   General Date of Onset: 09/12/20 HPI: [name] is a 75 y.o. female with medical history significant of HTN, but not currently on any antihypertensive medications.  Patient reports she is from [place] and last PCP visit was more than a year ago and she has been without medication for that duration.  She complains of having dizziness and unsteady gait for 2 weeks  which has been worse today so she came to the emergency room.  After she arrived in the ED she has developed left-sided weakness, left-sided facial droop and left neglect.  Stroke code was activated. Exact time of onset of symptoms unknown since patient has been having unsteady gait for few days, Patient also reports intermittent headache because of uncontrolled hypertension.  Her symptoms resolved within an hour.  Patient was seen by tele neurologist Dr. Iver Nestle, who recommended  stroke work-up, dual antiplatelet agents , aspirin and Plavix, statins.   Patient denies any recent travel, sick contacts, cough, weight loss, chest pain, shortness of breath.  Patient found to have acute and subacute right parietal stroke.  She is status post right ICA mechanical thrombectomy - revascularization in IR. Type of Study: Bedside Swallow Evaluation Previous Swallow Assessment: None noted at [facility] Diet Prior to this Study: NPO Temperature Spikes Noted: No Respiratory Status: Nasal cannula History of Recent Intubation: No Behavior/Cognition: Alert;Cooperative;Requires cueing Oral Cavity Assessment: Within Functional Limits Oral Care Completed by SLP: No Oral Cavity - Dentition: Missing dentition Self-Feeding Abilities: Needs assist Patient Positioning: Upright in bed Baseline Vocal Quality: Normal Volitional Swallow: Able to elicit    Oral/Motor/Sensory Function Overall Oral Motor/Sensory Function: Within functional limits   Ice Chips Ice chips: Not tested   Thin Liquid Thin Liquid: Within functional limits Presentation: Cup;Spoon;Straw;Self Fed (self fed with assist)    Nectar Thick Nectar Thick Liquid: Within functional limits   Honey Thick Honey Thick Liquid: Not tested   Puree Puree: Within functional limits Presentation: Spoon   Solid     Solid: Impaired Presentation: Spoon Oral Phase Impairments: Impaired mastication Oral Phase Functional Implications: Impaired mastication;Prolonged oral transit      Dimas Aguas, MA, CCC-SLP Acute Rehab SLP [phone]  Fleet Contras 09/13/2020,12:35 PM

## 2020-09-14 ENCOUNTER — Inpatient Hospital Stay (HOSPITAL_COMMUNITY): Payer: Medicare Other

## 2020-09-14 ENCOUNTER — Encounter (HOSPITAL_COMMUNITY): Payer: Self-pay | Admitting: Interventional Radiology

## 2020-09-14 DIAGNOSIS — I639 Cerebral infarction, unspecified: Secondary | ICD-10-CM | POA: Diagnosis not present

## 2020-09-14 LAB — COMPREHENSIVE METABOLIC PANEL
ALT: 8 U/L (ref 0–44)
AST: 15 U/L (ref 15–41)
Albumin: 2.9 g/dL — ABNORMAL LOW (ref 3.5–5.0)
Alkaline Phosphatase: 50 U/L (ref 38–126)
Anion gap: 7 (ref 5–15)
BUN: 14 mg/dL (ref 8–23)
CO2: 23 mmol/L (ref 22–32)
Calcium: 8.7 mg/dL — ABNORMAL LOW (ref 8.9–10.3)
Chloride: 108 mmol/L (ref 98–111)
Creatinine, Ser: 0.71 mg/dL (ref 0.44–1.00)
GFR, Estimated: >60 mL/min (ref >60)
Glucose, Bld: 79 mg/dL (ref 70–99)
Potassium: 3.7 mmol/L (ref 3.5–5.1)
Sodium: 138 mmol/L (ref 135–145)
Total Bilirubin: 0.4 mg/dL (ref 0.3–1.2)
Total Protein: 5.8 g/dL — ABNORMAL LOW (ref 6.5–8.1)

## 2020-09-14 LAB — CBC
HCT: 30.8 % — ABNORMAL LOW (ref 36.0–46.0)
Hemoglobin: 9.9 g/dL — ABNORMAL LOW (ref 12.0–15.0)
MCH: 29.3 pg (ref 26.0–34.0)
MCHC: 32.1 g/dL (ref 30.0–36.0)
MCV: 91.1 fL (ref 80.0–100.0)
Platelets: 123 10*3/uL — ABNORMAL LOW (ref 150–400)
RBC: 3.38 MIL/uL — ABNORMAL LOW (ref 3.87–5.11)
RDW: 12.9 % (ref 11.5–15.5)
WBC: 6 10*3/uL (ref 4.0–10.5)
nRBC: 0 % (ref 0.0–0.2)

## 2020-09-14 MED ORDER — LISINOPRIL 20 MG PO TABS
20.0000 mg | ORAL_TABLET | Freq: Every day | ORAL | Status: DC
  Administered 2020-09-14: 20 mg via ORAL
  Filled 2020-09-14: qty 1

## 2020-09-14 MED ORDER — AMLODIPINE BESYLATE 5 MG PO TABS
5.0000 mg | ORAL_TABLET | Freq: Every day | ORAL | Status: DC
  Administered 2020-09-14: 5 mg via ORAL
  Filled 2020-09-14 (×2): qty 1

## 2020-09-14 MED ORDER — HYDRALAZINE HCL 20 MG/ML IJ SOLN
10.0000 mg | Freq: Once | INTRAMUSCULAR | Status: DC
  Filled 2020-09-14: qty 1

## 2020-09-14 NOTE — Progress Notes (Signed)
Occupational Therapy Treatment Patient Details Name: [name] MRN: [id number] DOB: 1946-03-04 Today's Date: 09/14/2020    History of present illness 75 yo female presents to [facility] on 5/5 with 2-3 week history of dizziness, unsteadiness. 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 Rapid response called 5/6 due to new onset L sided weakness, pt taken to IR for thrombectomy of R ICA/MCA occlusions. PMH significant for hypertension, COPD, aortic aneurysm, DVT in pregnancy, former smoking, nonadherence to medications for the past year, remote MI.   OT comments  Pt progressing well since last session. Patient limited by L-sided weakness, L inattention and decreased initiation with cognitive deficits.  Pt requiring cues to scan environment for safety and to avoid objects on L side. Pt set-upA to modA for ADL due to safety and instability in standing. Pt requires minA to modA for mobility as pt unsafe and was independently walking with no AD. Pt continues to have deficits in safety and unable to correct behavior without cues.  Pt requires continued OT skilled services for ADL, mobility and cognition. OT following acutely.   Follow Up Recommendations  CIR    Equipment Recommendations  3 in 1 bedside commode    Recommendations for Other Services Rehab consult    Precautions / Restrictions Precautions Precautions: Fall Precaution Comments: L inattention Restrictions Weight Bearing Restrictions: No       Mobility Bed Mobility Overal bed mobility: Needs Assistance Bed Mobility: Supine to Sit     Supine to sit: Mod assist;HOB elevated     General bed mobility comments: pt able to bring BLE to EOB, but needing modA to pull trunk to sit    Transfers Overall transfer level: Needs  assistance Equipment used: Rolling walker (2 wheeled) Transfers: Sit to/from Stand Sit to Stand: Min assist;Mod assist         General transfer comment: modA initially, progressed to minA with rail to stand from toilet. repeated cues for safe hand placement    Balance Overall balance assessment: Needs assistance Sitting-balance support: Bilateral upper extremity supported;Feet supported Sitting balance-Leahy Scale: Fair     Standing balance support: Bilateral upper extremity supported Standing balance-Leahy Scale: Poor Standing balance comment: +2 modA standing at EOB                           ADL either performed or assessed with clinical judgement   ADL Overall ADL's : Needs assistance/impaired Eating/Feeding: Set up;Sitting   Grooming: Min guard;Standing;Wash/dry hands;Wash/dry face               Lower Body Dressing: Maximal assistance;+2 for physical assistance;+2 for safety/equipment;Cueing for safety Lower Body Dressing Details (indicate cue type and reason): assist for socks Toilet Transfer: Minimal assistance;+2 for physical assistance;+2 for safety/equipment;Ambulation;Grab bars;RW Statistician Details (indicate cue type and reason): cues for L side peripheral field deficits Toileting- Clothing Manipulation and Hygiene: Minimal assistance;+2 for physical assistance;+2 for safety/equipment;Sitting/lateral lean;Sit to/from stand Toileting - Clothing Manipulation Details (indicate cue type and reason): Pt standing with RUE holding onto grab bar and LUE performing pericare.     Functional mobility during ADLs: Minimal assistance;+2 for physical assistance;+2 for safety/equipment;Cueing for safety;Cueing for sequencing;Rolling walker;Moderate assistance (ModA iniitally for stability and minA +2 thereafter) General ADL Comments: Patient limited by L-sided  weakness, L peripheral visual deficits and decreased awareness of deficits with maximal cues to look to  L.     Vision       Perception     Praxis      Cognition Arousal/Alertness: Awake/alert Behavior During Therapy: Flat affect Overall Cognitive Status: Impaired/Different from baseline Area of Impairment: Attention;Safety/judgement;Awareness;Problem solving                   Current Attention Level: Sustained Memory: Decreased short-term memory;Decreased recall of precautions Following Commands: Follows one step commands with increased time Safety/Judgement: Decreased awareness of deficits;Decreased awareness of safety Awareness: Emergent Problem Solving: Requires verbal cues;Requires tactile cues;Slow processing;Decreased initiation General Comments: Pt flat but pleasant. able to make appropriate jokes with granddaughters, but needing repeated cues to attend to L side of environment. repeated cues for direction        Exercises     Shoulder Instructions       General Comments L inattention; Pt requiring cues to scan environment for safety and to avoid objects on L side.    Pertinent Vitals/ Pain       Pain Assessment: No/denies pain  Home Living                                          Prior Functioning/Environment              Frequency  Min 2X/week        Progress Toward Goals  OT Goals(current goals can now be found in the care plan section)  Progress towards OT goals: Progressing toward goals  Acute Rehab OT Goals Patient Stated Goal: to sleep OT Goal Formulation: With patient Time For Goal Achievement: 09/26/20 Potential to Achieve Goals: Good ADL Goals Pt Will Perform Grooming: standing;with min guard assist Pt Will Perform Upper Body Dressing: with min guard assist;standing Pt Will Perform Lower Body Dressing: with min assist;sit to/from stand Pt Will Transfer to Toilet: with min guard assist;ambulating Pt Will Perform Toileting - Clothing Manipulation and hygiene: with min assist;sit to/from stand Additional ADL  Goal #1: Pt will recall and demo 3 compensatory strategies for L inattention. Additional ADL Goal #2: Pt will demo LUE NMR HEP with use of written handout and minA  Plan Discharge plan remains appropriate    Co-evaluation    PT/OT/SLP Co-Evaluation/Treatment: Yes Reason for Co-Treatment: Complexity of the patient's impairments (multi-system involvement);To address functional/ADL transfers PT goals addressed during session: Mobility/safety with mobility;Balance OT goals addressed during session: ADL's and self-care      AM-PAC OT "6 Clicks" Daily Activity     Outcome Measure   Help from another person eating meals?: A Little Help from another person taking care of personal grooming?: A Little Help from another person toileting, which includes using toliet, bedpan, or urinal?: A Little Help from another person bathing (including washing, rinsing, drying)?: A Lot Help from another person to put on and taking off regular upper body clothing?: A Little Help from another person to put on and taking off regular lower body clothing?: A Lot 6 Click Score: 16    End of Session Equipment Utilized During Treatment: Gait belt;Rolling walker  OT Visit Diagnosis: Unsteadiness on feet (R26.81);Muscle weakness (generalized) (M62.81);Low vision, both eyes (H54.2);Other symptoms and signs involving cognitive function   Activity Tolerance Patient tolerated treatment well   Patient Left in bed;with call bell/phone within  reach;with bed alarm set   Nurse Communication Mobility status        Time: 0174-9449 OT Time Calculation (min): 38 min  Charges: OT General Charges $OT Visit: 1 Visit OT Treatments $Self Care/Home Management : 8-22 mins  Flora Lipps, OTR/L Acute Rehabilitation Services Pager: [phone] Office: [phone]    [name] 09/14/2020, 8:19 PM

## 2020-09-14 NOTE — Progress Notes (Signed)
Physical Therapy Treatment Patient Details Name: [name] MRN: [id number] DOB: Aug 16, 1945 Today's Date: 09/14/2020    History of Present Illness 75 yo female presents to [facility] on 5/5 with 2-3 week history of dizziness, unsteadiness. 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 Rapid response called 5/6 due to new onset L sided weakness, pt taken to IR for thrombectomy of R ICA/MCA occlusions. PMH significant for hypertension, COPD, aortic aneurysm, DVT in pregnancy, former smoking, nonadherence to medications for the past year, remote MI.    PT Comments    The pt was agreeable to session and was able to make good progress with activity tolerance and stability from last session. The pt continued to require modA to complete bed mobility, but was able to progress to minA with UE support by end of session. The pt does require continued cues and prompts to attend to L side of environment due to L inattention. The pt requires intermittent minA to direct and minA to steady with gait at this time. The pt will continue to benefit from skilled therapies acutely and following d/c to maximize return to independence, and to improve pt safety awareness and stability to reduce risk of fall after d/c.     Follow Up Recommendations  CIR     Equipment Recommendations  None recommended by PT    Recommendations for Other Services       Precautions / Restrictions Precautions Precautions: Fall Precaution Comments: L inattention Restrictions Weight Bearing Restrictions: No    Mobility  Bed Mobility Overal bed mobility: Needs Assistance Bed Mobility: Supine to Sit     Supine to sit: Mod assist;HOB elevated     General bed mobility comments: pt able to bring BLE to EOB, but needing modA to pull  trunk to sit    Transfers Overall transfer level: Needs assistance Equipment used: Rolling walker (2 wheeled) Transfers: Sit to/from Stand Sit to Stand: Min assist;Mod assist         General transfer comment: modA initially, progressed to minA with rail to stand from toilet. repeated cues for safe hand placement  Ambulation/Gait Ambulation/Gait assistance: Min assist Gait Distance (Feet): 45 Feet (+ 11ft) Assistive device: Rolling walker (2 wheeled) Gait Pattern/deviations: Step-to pattern;Decreased stride length;Decreased dorsiflexion - left;Shuffle;Drifts right/left;Trunk flexed Gait velocity: 0.3 m/s Gait velocity interpretation: <1.31 ft/sec, indicative of household ambulator General Gait Details: short steps with minimal clearance. progressively worse LLE DF and clearance despite cues. minA at times to steady, minA frequently to manage RW. pt running into multiple objects with ambulation in her room and hallway      Modified Rankin (Stroke Patients Only) Modified Rankin (Stroke Patients Only) Pre-Morbid Rankin Score: No symptoms Modified Rankin: Moderately severe disability     Balance Overall balance assessment: Needs assistance Sitting-balance support: Bilateral upper extremity supported;Feet supported Sitting balance-Leahy Scale: Fair     Standing balance support: Bilateral upper extremity supported Standing balance-Leahy Scale: Poor Standing balance comment: +2 modA for stance                            Cognition Arousal/Alertness: Awake/alert Behavior During Therapy: Flat affect Overall Cognitive Status: Impaired/Different from baseline Area of Impairment: Attention;Safety/judgement;Awareness;Problem solving  Current Attention Level: Sustained Memory: Decreased short-term memory;Decreased recall of precautions Following Commands: Follows one step commands with increased time Safety/Judgement: Decreased awareness of  deficits;Decreased awareness of safety Awareness: Emergent Problem Solving: Requires verbal cues;Requires tactile cues;Slow processing;Decreased initiation General Comments: Pt flat but pleasant. able to make appropriate jokes with granddaughters, but needing repeated cues to attend to L side of environment. repeated cues for direction      Exercises      General Comments General comments (skin integrity, edema, etc.): L inattention, pt and family educated on scanning environment for safety with mobility. pt verbalized understanding but unable to apply to mobility despite repeated cues      Pertinent Vitals/Pain Pain Assessment: No/denies pain           PT Goals (current goals can now be found in the care plan section) Acute Rehab PT Goals Patient Stated Goal: to sleep PT Goal Formulation: With patient Time For Goal Achievement: 09/26/20 Potential to Achieve Goals: Good Progress towards PT goals: Progressing toward goals    Frequency    Min 4X/week      PT Plan Current plan remains appropriate    Co-evaluation PT/OT/SLP Co-Evaluation/Treatment: Yes Reason for Co-Treatment: Necessary to address cognition/behavior during functional activity;For patient/therapist safety;To address functional/ADL transfers PT goals addressed during session: Mobility/safety with mobility;Balance        AM-PAC PT "6 Clicks" Mobility   Outcome Measure  Help needed turning from your back to your side while in a flat bed without using bedrails?: A Little Help needed moving from lying on your back to sitting on the side of a flat bed without using bedrails?: A Lot Help needed moving to and from a bed to a chair (including a wheelchair)?: A Little Help needed standing up from a chair using your arms (e.g., wheelchair or bedside chair)?: A Little Help needed to walk in hospital room?: A Little Help needed climbing 3-5 steps with a railing? : A Lot 6 Click Score: 16    End of Session  Equipment Utilized During Treatment: Gait belt Activity Tolerance: Patient tolerated treatment well;Patient limited by fatigue Patient left: in chair;with call bell/phone within reach;with chair alarm set;with family/visitor present Nurse Communication: Mobility status PT Visit Diagnosis: Other abnormalities of gait and mobility (R26.89);Difficulty in walking, not elsewhere classified (R26.2);Other symptoms and signs involving the nervous system (A07.622)     Time: 6333-5456 PT Time Calculation (min) (ACUTE ONLY): 36 min  Charges:  $Gait Training: 8-22 mins                     Rolm Baptise, PT, DPT   Acute Rehabilitation Department Pager #: [phone]   Gaetana Michaelis 09/14/2020, 6:21 PM

## 2020-09-14 NOTE — Discharge Instructions (Signed)
Femoral Site Care This sheet gives you information about how to care for yourself after your procedure. Your health care provider may also give you more specific instructions. If you have problems or questions, contact your health care provider. What can I expect after the procedure? After the procedure, it is common to have:  Bruising that usually fades within 1-2 weeks.  Tenderness at the site. Follow these instructions at home: Wound care 1. Follow instructions from your health care provider about how to take care of your insertion site. Make sure you: ? Wash your hands with soap and water before you change your bandage (dressing). If soap and water are not available, use hand sanitizer. ? Change your dressing as directed- pressure dressing removed 24 hours post-procedure (and switch for bandaid), bandaid removed 72 hours post-procedure 2. Do not take baths, swim, or use a hot tub for 7 days post-procedure. 3. You may shower 48 hours after the procedure or as told by your health care provider. ? Gently wash the site with plain soap and water. ? Pat the area dry with a clean towel. ? Do not rub the site. This may cause bleeding. 4. Check your site every day for signs of infection. Check for: ? Redness, swelling, or pain. ? Fluid or blood. ? Warmth. ? Pus or a bad smell. Activity  Do not stoop, bend, or lift anything that is heavier than 10 lb (4.5 kg) for 2 weeks post-procedure.  Do not drive self for 2 weeks post-procedure. Contact a health care provider if you have:  A fever or chills.  You have redness, swelling, or pain around your insertion site. Get help right away if:  The catheter insertion area swells very fast.  You pass out.  You suddenly start to sweat or your skin gets clammy.  The catheter insertion area is bleeding, and the bleeding does not stop when you hold steady pressure on the area.  The area near or just beyond the catheter insertion site becomes  pale, cool, tingly, or numb. These symptoms may represent a serious problem that is an emergency. Do not wait to see if the symptoms will go away. Get medical help right away. Call your local emergency services (911 in the [place] Do not drive yourself to the hospital.  This information is not intended to replace advice given to you by your health care provider. Make sure you discuss any questions you have with your health care provider. Document Revised: 05/08/2017 Document Reviewed: 05/08/2017 [employer] Patient Education  2020 [employer] 

## 2020-09-14 NOTE — Progress Notes (Signed)
PROGRESS NOTE    [name]  OZH:[id number] DOB: Jan 03, 1946 DOA: 09/10/2020 PCP: Patient, No Pcp Per (Inactive)    Brief Narrative:  75 y.o. female with medical history significant of HTN, not on any antihypertensive medications.  Patient reports she is from [place] and last PCP visit was more than a year ago and she is without medication for that duration.  She complains of having dizziness and unsteady gait for 2 weeks which has been worse in the morning today which brought her to the emergency room.  After she arrived in the ED she has developed left-sided weakness, left-sided facial droop and left neglect.  Stroke code was activated. Exact time of onset of symptoms unknown since patient has been having unsteady gait for few days, Patient also reports intermittent headache because of uncontrolled hypertension.  Patient symptoms has resolved within an hour.  Patient was seen by tele neurologist Dr. Iver Nestle, recommended  stroke work-up, dual antiplatelet agents , aspirin and Plavix, statins.  Patient denies any recent travel, sick contacts, cough, weight loss, chest pain, shortness of breath.   ED Course: She was hemodynamically stable except hypertension.   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 At least partial reconstitution at the clinoid intracranially. Less than 50% stenosis at the left ICA origin.  Assessment & Plan:   Active Problems:   TIA (transient ischemic attack)  Dizziness /unsteady gait secondary to  acute and subacute R parietal infarct:  -MRI brain reviewed. Findings of R parietal small to moderate size acute and subacute infarct with few additional punctate acute to subacute R centrum semiovale infarcts noted  -CTA head and neck: Age indeterminate occlusion of right cervical ICA just beyond the origin.  Less than 50% stenosis of left ICA origin -Continue Lipitor 80 mg daily -Evens from 5/6 noted. On the evening of 5/6, patient was noted to have acute weakness on the L, prompting code stroke. Pt underwent emergent thrombectomy, since transferred to ICU -Pt had remained in ICU for tight bp control following procedure -Discussed with Neurology. Recommendation for ASA 81mg  with Brilinta 90mg  bid x 3 months followed by ASA alone -Neurology recommends outpatient loop recorder to eval for afib at discharge  Hypertension: -Poorly controlled -No bp meds per home med rec prior to admit -Had started low dose lisinopril 5/6, dose increased to 20mg  daily -Had required clevidipine for tight bp control while in ICU, since weaned off -Have added norvasc 5mg  -Continue PRN hydralazine  QT prolongation: -Initial EKG with QTc of 550 -Repeat EKG with qtc of 487 -Would continue to replace lytes as needed  DVT prophylaxis: SCD's Code Status: Full Family Communication: Pt in room, family not at bedside  Status is: Inpatient  Remains inpatient appropriate because:Inpatient level of care appropriate due to severity of illness  Dispo: The patient is from: Home              Anticipated d/c is to: Home              Patient currently is not medically stable to d/c.  Difficult to place patient No   Consultants:   Neurology  IR  Procedures:   Emergent thrombectomy 5/6  Antimicrobials: Anti-infectives (From admission, onward)   None      Subjective: Without complaints this AM  Objective: Vitals:   09/14/20 1400 09/14/20 1500 09/14/20 1600 09/14/20 1700  BP: (!) 173/57 (!) 175/54  (!) 162/52 123/62  Pulse: (!) 37 (!) 39 (!) 41 (!) 58  Resp: 18 12 10    Temp:   (!) 97.5 F (36.4 C)   TempSrc:   Oral   SpO2: 97% 97% 99% 98%  Weight:      Height:        Intake/Output Summary (Last 24 hours) at 09/14/2020 1743 Last data filed at 09/14/2020 1400 Gross per 24 hour  Intake 240 ml  Output 1050 ml  Net -810 ml   Filed Weights   09/10/20 1454  Weight: 95.3 kg    Examination: General exam: Conversant, in no acute distress Respiratory system: normal chest rise, clear, no audible wheezing Cardiovascular system: regular rhythm, s1-s2 Gastrointestinal system: Nondistended, nontender, pos BS Central nervous system: No seizures, no tremors Extremities: No cyanosis, no joint deformities Skin: No rashes, no pallor Psychiatry: Affect normal // no auditory hallucinations   Data Reviewed: I have personally reviewed following labs and imaging studies  CBC: Recent Labs  Lab 09/10/20 1540 09/10/20 1559 09/12/20 0025 09/13/20 0312 09/14/20 0407  WBC 4.4  --  4.3 6.9 6.0  NEUTROABS 1.9  --   --   --   --   HGB 12.2 12.6 12.0 11.0* 9.9*  HCT 38.3 37.0 37.4 33.4* 30.8*  MCV 90.5  --  89.9 90.3 91.1  PLT 141*  --  156 145* 123*   Basic Metabolic Panel: Recent Labs  Lab 09/10/20 1540 09/10/20 1559 09/13/20 0312 09/14/20 0407  NA 141 143 138 138  K 3.3* 3.7 2.8* 3.7  CL 106 105 103 108  CO2 26  --  27 23  GLUCOSE 88 84 87 79  BUN 18 20 10 14   CREATININE 0.71 0.90 0.77 0.71  CALCIUM 8.7*  --  8.8* 8.7*  MG  --   --  1.9  --    GFR: Estimated Creatinine Clearance: 74.8 mL/min (by C-G formula based on SCr of 0.71 mg/dL). Liver Function Tests: Recent Labs  Lab 09/10/20 1540 09/13/20 0312 09/14/20 0407  AST 17 16 15   ALT 8 8 8   ALKPHOS 67 60 50  BILITOT 0.4 0.2* 0.4  PROT 7.1 6.6 5.8*  ALBUMIN 3.6 3.2* 2.9*   No results for input(s): LIPASE, AMYLASE in the last 168 hours. No results for input(s): AMMONIA in the last 168 hours. Coagulation  Profile: Recent Labs  Lab 09/10/20 1540  INR 1.0   Cardiac Enzymes: Recent Labs  Lab 09/12/20 0025  CKTOTAL 78   BNP (last 3 results) No results for input(s): PROBNP in the last 8760 hours. HbA1C: No results for input(s): HGBA1C in the last 72 hours. CBG: Recent Labs  Lab 09/10/20 1544 09/11/20 1931  GLUCAP 87 107*   Lipid Profile: Recent Labs    09/12/20 0025  CHOL 230*  HDL 53  LDLCALC 160*  TRIG 83  CHOLHDL 4.3   Thyroid Function Tests: No results for input(s): TSH, T4TOTAL, FREET4, T3FREE, THYROIDAB in the last 72 hours. Anemia Panel: No results for input(s): VITAMINB12, FOLATE, FERRITIN, TIBC, IRON, RETICCTPCT in the last 72 hours. Sepsis Labs: No results for input(s): PROCALCITON, LATICACIDVEN in the last  168 hours.  Recent Results (from the past 240 hour(s))  Resp Panel by RT-PCR (Flu A&B, Covid) Nasopharyngeal Swab     Status: None   Collection Time: 09/10/20  4:27 PM   Specimen: Nasopharyngeal Swab; Nasopharyngeal(NP) swabs in vial transport medium  Result Value Ref Range Status   SARS Coronavirus 2 by RT PCR NEGATIVE NEGATIVE Final    Comment: (NOTE) SARS-CoV-2 target nucleic acids are NOT DETECTED.  The SARS-CoV-2 RNA is generally detectable in upper respiratory specimens during the acute phase of infection. The lowest concentration of SARS-CoV-2 viral copies this assay can detect is 138 copies/mL. A negative result does not preclude SARS-Cov-2 infection and should not be used as the sole basis for treatment or other patient management decisions. A negative result may occur with  improper specimen collection/handling, submission of specimen other than nasopharyngeal swab, presence of viral mutation(s) within the areas targeted by this assay, and inadequate number of viral copies(<138 copies/mL). A negative result must be combined with clinical observations, patient history, and epidemiological information. The expected result is Negative.  Fact  Sheet for Patients:  BloggerCourse.com  Fact Sheet for Healthcare Providers:  SeriousBroker.it  This test is no t yet approved or cleared by the [place] FDA and  has been authorized for detection and/or diagnosis of SARS-CoV-2 by FDA under an Emergency Use Authorization (EUA). This EUA will remain  in effect (meaning this test can be used) for the duration of the COVID-19 declaration under Section 564(b)(1) of the Act, 21 U.S.C.section 360bbb-3(b)(1), unless the authorization is terminated  or revoked sooner.       Influenza A by PCR NEGATIVE NEGATIVE Final   Influenza B by PCR NEGATIVE NEGATIVE Final    Comment: (NOTE) The Xpert Xpress SARS-CoV-2/FLU/RSV plus assay is intended as an aid in the diagnosis of influenza from Nasopharyngeal swab specimens and should not be used as a sole basis for treatment. Nasal washings and aspirates are unacceptable for Xpert Xpress SARS-CoV-2/FLU/RSV testing.  Fact Sheet for Patients: BloggerCourse.com  Fact Sheet for Healthcare Providers: SeriousBroker.it  This test is not yet approved or cleared by the [place] FDA and has been authorized for detection and/or diagnosis of SARS-CoV-2 by FDA under an Emergency Use Authorization (EUA). This EUA will remain in effect (meaning this test can be used) for the duration of the COVID-19 declaration under Section 564(b)(1) of the Act, 21 U.S.C. section 360bbb-3(b)(1), unless the authorization is terminated or revoked.  Performed at [facility], [address], [place], [place] [address]      Radiology Studies: No results found.  Scheduled Meds: . amLODipine  5 mg Oral Daily  . aspirin EC  81 mg Oral Daily  . atorvastatin  80 mg Oral QHS  . Chlorhexidine Gluconate Cloth  6 each Topical Daily  . clopidogrel  75 mg Oral Daily  . hydrALAZINE  10 mg Intravenous Once   . lisinopril  20 mg Oral Daily  . neomycin-polymyxin-hydrocortisone  3 drop Right EAR Q6H   Continuous Infusions: . sodium chloride Stopped (09/11/20 0900)  . sodium chloride    . clevidipine Stopped (09/13/20 1736)     LOS: 4 days   Rickey Barbara, MD [facility] Pager On Amion  If 7PM-7AM, please contact night-coverage 09/14/2020, 5:43 PM

## 2020-09-14 NOTE — Progress Notes (Signed)
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 Tommie Sams.  Subjective:  Patient awake and alert laying in bed with no complaints at this time. Bradycardic. Moving all extremities with weakness of left side. Mild LUE pronator drift. Right femoral puncture site c/d/i.   Allergies: Patient has no known allergies.  Medications: Prior to Admission medications   Medication Sig Start Date End Date Taking? Authorizing Provider  ibuprofen (ADVIL) 200 MG tablet Take 200 mg by mouth every 6 (six) hours as needed for headache.   Yes [provider]     Vital Signs: BP (!) 154/50 (BP Location: Right Arm)   Pulse (!) 36   Temp 98 F (36.7 C) (Axillary)   Resp 20   Ht  (1.778 m)   Wt 210 lb (95.3 kg)   SpO2 100%   BMI 30.13 kg/m   Physical Exam Constitutional:      General: She is not in acute distress.    Appearance: She is obese.  Cardiovascular:     Rate and Rhythm: Bradycardia present.  Pulmonary:     Effort: Pulmonary effort is normal. No respiratory distress.  Skin:    General: Skin is warm and dry.     Comments: Right femoral puncture site soft without active bleeding or hematoma.  Neurological:     Mental Status: She is alert.     Comments: Alert, awake, and oriented x3. Speech and comprehension intact. PERRL bilaterally. No facial asymmetry. Tongue midline. Can spontaneously move all extremities with weakness of left side. Mild LUE pronator drift. Distal pulses (DPs) palpable bilaterally.      Imaging: CT Angio Head W or Wo Contrast  Result Date: 09/10/2020 CLINICAL DATA:  Right gaze preference and left facial droop EXAM: CT ANGIOGRAPHY HEAD AND NECK TECHNIQUE: Multidetector CT imaging of the head and neck was performed using the standard protocol during bolus administration of intravenous contrast. Multiplanar CT image reconstructions and MIPs were obtained to evaluate the vascular anatomy. Carotid stenosis measurements (when applicable) are obtained utilizing NASCET criteria, using the distal internal carotid diameter as the denominator. CONTRAST:  75mL OMNIPAQUE IOHEXOL 350 MG/ML SOLN COMPARISON:  None. FINDINGS: CTA NECK Aortic arch: Eccentric noncalcified plaque along the visualized distal aortic arch. Great vessel origins are patent. No high-grade proximal subclavian stenosis. Right carotid system: Common carotid is patent. There is mixed plaque at the bifurcation. ICA is occluded just beyond the origin. ECA is patent. Left carotid system: Patent. Atherosclerotic wall thickening along the common carotid. Mixed but primarily calcified plaque at the bifurcation and along the proximal internal carotid. There is less than 50% stenosis. Vertebral arteries: Patent. Left vertebral artery slightly dominant. Mild plaque at the origins. Skeleton: Degenerative changes of the included spine. Other neck: No acute abnormality. Upper chest: No apical lung mass. Review of the MIP images confirms the above findings CTA HEAD Anterior circulation: At least partial reconstitution of the right ICA at the clinoid where there is calcified plaque. Right anterior and middle cerebral arteries are patent. Atherosclerotic irregularity of the right M1 MCA with mild and moderate stenoses. Mild distal branch atherosclerotic irregularity. Left intracranial  internal carotid arteries patent with calcified plaque causing mild stenosis. Left anterior and middle cerebral arteries are patent. Atherosclerotic irregularity of  the left M1 MCA with mild stenosis. Mild distal branch atherosclerotic irregularity. Posterior circulation: Intracranial vertebral arteries are patent. Basilar artery is patent. Major cerebellar artery origins are patent. Posterior cerebral arteries are patent. Focal moderate stenosis of the right P2 PCA. A right posterior communicating artery is present. Venous sinuses: Patent as allowed by contrast bolus timing. 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 At least partial reconstitution at the clinoid intracranially. Less than 50% stenosis at the left ICA origin. Mild to moderate intracranial atherosclerosis. Results were discussed by telephone at the time of interpretation on 09/10/2020 at 4:34 pm with provider Dr. Iver Nestle, who verbally acknowledged these results. Electronically Signed   By: Guadlupe Spanish M.D.   On: 09/10/2020 16:37   CT Angio Neck W and/or Wo Contrast  Result Date: 09/10/2020 CLINICAL DATA:  Right gaze preference and left facial droop EXAM: CT ANGIOGRAPHY HEAD AND NECK TECHNIQUE: Multidetector CT imaging of the head and neck was performed using the standard protocol during bolus administration of intravenous contrast. Multiplanar CT image reconstructions and MIPs were obtained to evaluate the vascular anatomy. Carotid stenosis measurements (when applicable) are obtained utilizing NASCET criteria, using the distal internal carotid diameter as the denominator. CONTRAST:  75mL OMNIPAQUE IOHEXOL 350 MG/ML SOLN COMPARISON:  None. FINDINGS: CTA NECK Aortic arch: Eccentric noncalcified plaque along the visualized distal aortic arch. Great vessel origins are patent. No high-grade proximal subclavian stenosis. Right carotid system: Common carotid is patent. There is mixed plaque at the bifurcation. ICA is occluded just beyond the origin. ECA is patent. Left carotid system: Patent. Atherosclerotic wall  thickening along the common carotid. Mixed but primarily calcified plaque at the bifurcation and along the proximal internal carotid. There is less than 50% stenosis. Vertebral arteries: Patent. Left vertebral artery slightly dominant. Mild plaque at the origins. Skeleton: Degenerative changes of the included spine. Other neck: No acute abnormality. Upper chest: No apical lung mass. Review of the MIP images confirms the above findings CTA HEAD Anterior circulation: At least partial reconstitution of the right ICA at the clinoid where there is calcified plaque. Right anterior and middle cerebral arteries are patent. Atherosclerotic irregularity of the right M1 MCA with mild and moderate stenoses. Mild distal branch atherosclerotic irregularity. Left intracranial internal carotid arteries patent with calcified plaque causing mild stenosis. Left anterior and middle cerebral arteries are patent. Atherosclerotic irregularity of the left M1 MCA with mild stenosis. Mild distal branch atherosclerotic irregularity. Posterior circulation: Intracranial vertebral arteries are patent. Basilar artery is patent. Major cerebellar artery origins are patent. Posterior cerebral arteries are patent. Focal moderate stenosis of the right P2 PCA. A right posterior communicating artery is present. Venous sinuses: Patent as allowed by contrast bolus timing. 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 At least partial reconstitution at the clinoid intracranially. Less than 50% stenosis at the left ICA origin. Mild to moderate intracranial atherosclerosis. Results were discussed by telephone at the time of interpretation on 09/10/2020 at 4:34 pm with provider Dr. Iver Nestle, who verbally acknowledged these results. Electronically Signed   By: Guadlupe Spanish M.D.   On: 09/10/2020 16:37   MR BRAIN WO CONTRAST  Result Date: 09/10/2020 CLINICAL DATA:   ICA occlusion, code stroke follow-up  EXAM: MRI HEAD WITHOUT CONTRAST TECHNIQUE: Multiplanar, multiecho pulse sequences of the brain and surrounding structures were obtained without intravenous contrast. COMPARISON:  Prior CT FINDINGS: Brain: There is diffusion hyperintensity in the right parietal lobe corresponding to infarct on CT. There is mixed ADC including hypointensity. Few additional punctate foci of mildly reduced diffusion in the right centrum semiovale. There is no evidence of intracranial hemorrhage. There is no intracranial mass or significant mass effect. There is no hydrocephalus or extra-axial fluid collection. Prominence of the ventricles and sulci reflects generalized parenchymal volume loss. Other patchy foci of T2 hyperintensity in the supratentorial white matter are nonspecific but may reflect mild chronic microvascular ischemic changes. Vascular: Loss of the flow void of the visualized cervical right ICA and proximal intracranial portion corresponding to findings on CTA. Skull and upper cervical spine: Normal marrow signal is preserved. Sinuses/Orbits: Paranasal sinus mucosal thickening with maxillary sinus air-fluid levels. Orbits are unremarkable. Other: Sella is unremarkable.  Mastoid air cells are clear. 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 No evidence of hemorrhage. Electronically Signed   By: Guadlupe Spanish M.D.   On: 09/10/2020 19:08   CT CEREBRAL PERFUSION W CONTRAST  Result Date: 09/11/2020 CLINICAL DATA:  Progressive left-sided weakness. EXAM: CT ANGIOGRAPHY HEAD AND NECK CT PERFUSION BRAIN TECHNIQUE: Multidetector CT imaging of the head and neck was performed using the standard protocol during bolus administration of intravenous contrast. Multiplanar CT image reconstructions and MIPs were obtained to evaluate the vascular anatomy. Carotid stenosis  measurements (when applicable) are obtained utilizing NASCET criteria, using the distal internal carotid diameter as the denominator. Multiphase CT imaging of the brain was performed following IV bolus contrast injection. Subsequent parametric perfusion maps were calculated using RAPID software. CONTRAST:  OMNIPAQUE IOHEXOL 350 MG/ML SOLN COMPARISON:  MR head without contrast, CT head without contrast, and CTA head and neck 09/10/2020. FINDINGS: CTA NECK FINDINGS Aortic arch: No significant interval change in the 3 vessel aortic arch with atherosclerotic calcifications. Right carotid system: The right common carotid artery is unremarkable. The right ICA is occluded at the bifurcation without significant reconstitution in the neck. Left carotid system: The left common carotid artery is within normal limits. Dense calcifications are present at the proximal ICA without a significant stenosis relative to the more distal vessel. There is mild tortuosity without significant stenosis in the cervical ICA. Vertebral arteries: Left vertebral artery is dominant vessel. Both vertebral arteries originate from the subclavian arteries without significant stenosis. No significant stenosis is present in either vertebral artery in the neck. Skeleton: Multilevel degenerative changes are present. Anterior osteophytes are fused at C3-4. No significant interval change. Other neck: No acute abnormality or significant interval change. Upper chest: Lung apices are clear.  Thoracic inlet is normal. Review of the MIP images confirms the above findings CTA HEAD FINDINGS Anterior circulation: The right internal carotid artery remains occluded through the ophthalmic segment. Atherosclerotic changes are present the cavernous left ICA without significant stenosis through the ICA terminus. The left A1 segment and right A1 segment are similar the prior exam. Proximal right M1 segment narrowing has progressed. Is some irregularity in the left  M1 segment without significant stenosis. There is significant loss of perfusion right MCA branch vessels compared to prior exam. Left MCA branch vessels remain intact. ACA vessels are unchanged. Posterior circulation: Moderate narrowing is present at the dural margin of the right vertebral  artery, progressive from the prior exam. Left vertebral artery is unchanged. Vertebrobasilar junction normal. The basilar artery is small. Both posterior cerebral arteries originate from the basilar tip. Prominent right posterior communicating artery is present. There is segmental attenuation PCA branch vessels similar the prior study. Venous sinuses: Dural sinuses are patent. Straight sinus and deep cerebral veins are intact. Anatomic variants: None Review of the MIP images confirms the above findings CT Brain Perfusion Findings: ASPECTS: 10/10 CBF (<30%) Volume: 0mL Perfusion (Tmax>6.0s) volume: Mismatch Volume: Infarction Location:Right MCA territory IMPRESSION: 1. 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 2. 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 3. Progressive proximal right M1 segment stenosis with significant loss of perfusion in the right MCA branch vessels. 4. Stable atherosclerotic changes at the left carotid bifurcation without significant stenosis. 5. 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 Basilar artery is similar the prior exam. 6.  Aortic Atherosclerosis (ICD10-I70.0). Electronically Signed   By: Marin Roberts M.D.   On: 09/11/2020 20:25   ECHOCARDIOGRAM COMPLETE  Result Date: 09/12/2020    ECHOCARDIOGRAM REPORT   Patient Name:   [name] Date of Exam: 09/12/2020 Medical Rec #:  [id number]   Height:       70.0 in Accession #:    [id number]  Weight:       210.0 lb Date of Birth:  1945/08/06  BSA:          2.131 m Patient Age:    74 years    BP:            99/45 mmHg Patient Gender: F           HR:           36 bpm. Exam Location:  Inpatient Procedure: 2D Echo, Cardiac Doppler and Color Doppler Indications:    TIA  History:        Patient has no prior history of Echocardiogram examinations.                 CHF, COPD; Risk Factors:Hypertension and Diabetes.  Sonographer:    Neomia Dear RDCS Referring Phys: [id number] PARDEEP IONGE  Sonographer Comments: No cardiac surgery noted in chart. IMPRESSIONS  1. Left ventricular ejection fraction, by estimation, is 60 to 65%. The left ventricle has normal function. The left ventricle has no regional wall motion abnormalities. There is mild left ventricular hypertrophy. Left ventricular diastolic parameters are consistent with Grade I diastolic dysfunction (impaired relaxation).  2. Right ventricular systolic function is normal. The right ventricular size is normal. Tricuspid regurgitation signal is inadequate for assessing PA pressure.  3. Left atrial size was moderately dilated.  4. Right atrial size was mildly dilated.  5. The mitral valve is normal in structure. Trivial mitral valve regurgitation. No evidence of mitral stenosis.  6. The aortic valve is grossly normal. There is mild calcification of the aortic valve. Aortic valve regurgitation is not visualized. No aortic stenosis is present.  7. Aortic dilatation noted. There is borderline dilatation of the ascending aorta, measuring 39 mm.  8. The inferior vena cava is normal in size with greater than 50% respiratory variability, suggesting right atrial pressure of 3 mmHg. Conclusion(s)/Recommendation(s): No intracardiac source of embolism detected on this transthoracic study. A transesophageal echocardiogram is recommended to exclude cardiac source of embolism if clinically indicated. FINDINGS  Left Ventricle: Left ventricular ejection fraction, by estimation, is 60  to 65%. The left ventricle has normal function. The left ventricle has no regional wall motion abnormalities.  The left ventricular internal cavity size was normal in size. There is  mild left ventricular hypertrophy. Left ventricular diastolic parameters are consistent with Grade I diastolic dysfunction (impaired relaxation). Right Ventricle: The right ventricular size is normal. No increase in right ventricular wall thickness. Right ventricular systolic function is normal. Tricuspid regurgitation signal is inadequate for assessing PA pressure. Left Atrium: Left atrial size was moderately dilated. Right Atrium: Right atrial size was mildly dilated. Pericardium: There is no evidence of pericardial effusion. Mitral Valve: The mitral valve is normal in structure. Trivial mitral valve regurgitation. No evidence of mitral valve stenosis. Tricuspid Valve: The tricuspid valve is normal in structure. Tricuspid valve regurgitation is not demonstrated. No evidence of tricuspid stenosis. Aortic Valve: The aortic valve is grossly normal. There is mild calcification of the aortic valve. Aortic valve regurgitation is not visualized. No aortic stenosis is present. Aortic valve mean gradient measures 4.0 mmHg. Aortic valve peak gradient measures 9.5 mmHg. Aortic valve area, by VTI measures 3.12 cm. Pulmonic Valve: The pulmonic valve was normal in structure. Pulmonic valve regurgitation is trivial. No evidence of pulmonic stenosis. Aorta: Aortic dilatation noted. There is borderline dilatation of the ascending aorta, measuring 39 mm. Venous: The inferior vena cava is normal in size with greater than 50% respiratory variability, suggesting right atrial pressure of 3 mmHg. IAS/Shunts: No atrial level shunt detected by color flow Doppler. EKG: Rhythm strip during this exam demostrated sinus bradycardia.  LEFT VENTRICLE PLAX 2D LVIDd:         5.20 cm      Diastology LV PW:         1.20 cm      LV e' medial:    5.11 cm/s LV IVS:        1.30 cm      LV E/e' medial:  15.2 LVOT diam:     2.30 cm      LV e' lateral:   6.57 cm/s LV SV:         115           LV E/e' lateral: 11.9 LV SV Index:   54 LVOT Area:     4.15 cm  LV Volumes (MOD) LV vol d, MOD A2C: 63.5 ml LV vol d, MOD A4C: 130.0 ml LV vol s, MOD A2C: 27.5 ml LV vol s, MOD A4C: 27.9 ml LV SV MOD A2C:     36.0 ml LV SV MOD A4C:     130.0 ml LV SV MOD BP:      68.8 ml RIGHT VENTRICLE RV S prime:     16.30 cm/s  PULMONARY VEINS TAPSE (M-mode): 3.0 cm      A Reversal Duration: 113.00 msec                             A Reversal Velocity: 32.80 cm/s                             Diastolic Velocity:  39.40 cm/s                             S/D Velocity:        1.40  Systolic Velocity:   55.00 cm/s LEFT ATRIUM             Index LA diam:        4.30 cm 2.02 cm/m LA Vol (A2C):   95.6 ml 44.86 ml/m LA Vol (A4C):   80.7 ml 37.87 ml/m LA Biplane Vol: 90.0 ml 42.23 ml/m  AORTIC VALVE                   PULMONIC VALVE AV Area (Vmax):    3.26 cm    PV Vmax:       1.03 m/s AV Area (Vmean):   3.10 cm    PV Vmean:      68.500 cm/s AV Area (VTI):     3.12 cm    PV VTI:        0.305 m AV Vmax:           154.00 cm/s PV Peak grad:  4.2 mmHg AV Vmean:          94.300 cm/s PV Mean grad:  2.0 mmHg AV VTI:            0.367 m AV Peak Grad:      9.5 mmHg AV Mean Grad:      4.0 mmHg LVOT Vmax:         121.00 cm/s LVOT Vmean:        70.300 cm/s LVOT VTI:          0.276 m LVOT/AV VTI ratio: 0.75  AORTA Ao Root diam: 3.40 cm Ao Asc diam:  3.90 cm MITRAL VALVE MV Area (PHT): 2.48 cm    SHUNTS MV Decel Time: 306 msec    Systemic VTI:  0.28 m MV E velocity: 77.90 cm/s  Systemic Diam: 2.30 cm MV A velocity: 94.00 cm/s MV E/A ratio:  0.83 Weston Brass MD Electronically signed by Weston Brass MD Signature Date/Time: 09/12/2020/4:17:21 PM    Final    CT HEAD CODE STROKE WO CONTRAST  Result Date: 09/11/2020 CLINICAL DATA:  Code stroke. Neuro deficit. Acute stroke suspected. Dizziness and unsteady gait for 2 weeks. EXAM: CT HEAD WITHOUT CONTRAST TECHNIQUE: Contiguous axial images were obtained from the base  of the skull through the vertex without intravenous contrast. COMPARISON:  CT head without contrast and CTA head and neck 09/10/2020. FINDINGS: Brain: Right parietal acute/subacute infarct stable. No new infarct present. Basal ganglia are intact. Insular ribbon normal. The ventricles are of normal size. No significant extraaxial fluid collection is present. Vascular: Atherosclerotic calcifications are present within the cavernous internal carotid arteries bilaterally. No hyperdense vessel is present. Skull: Calvarium is intact. No focal lytic or blastic lesions are present. Sinuses/Orbits: Bilateral maxillary sinus fluid collections are present. Scattered ethmoid opacification noted. Sphenoid sinuses are clear. Frontal sinuses demonstrate mild mucosal thickening. Mastoid air cells are clear. The globes and orbits are within normal limits. ASPECTS [facility] Stroke Program Early CT Score) - Ganglionic level infarction (caudate, lentiform nuclei, internal capsule, insula, M1-M3 cortex): 7/7 - Supraganglionic infarction (M4-M6 cortex): 3/3 Total score (0-10 with 10 being normal): 10/10 IMPRESSION: 1. Stable appearance of right parietal acute/subacute infarct. 2. No new infarct. 3. ASPECTS is 10/10. Electronically Signed   By: Marin Roberts M.D.   On: 09/11/2020 20:00   CT HEAD CODE STROKE WO CONTRAST  Result Date: 09/10/2020 CLINICAL DATA:  Code stroke.  Acute neuro deficit EXAM: CT HEAD WITHOUT CONTRAST TECHNIQUE: Contiguous axial images were obtained from the base of the skull through the vertex  without intravenous contrast. COMPARISON:  None. FINDINGS: Brain: Ventricle size normal. Mild atrophy. Chronic infarct right parietal lobe. Mild white matter hypodensity bilaterally most consistent with microvascular ischemia Vascular: Negative for hyperdense vessel Skull: Negative Sinuses/Orbits: Mucosal edema in the paranasal sinuses. Air-fluid levels in both maxillary sinus. Mastoid clear bilaterally. Other: None  ASPECTS ([place] Stroke Program Early CT Score) - Ganglionic level infarction (caudate, lentiform nuclei, internal capsule, insula, M1-M3 cortex): 7 - Supraganglionic infarction (M4-M6 cortex): 3 Total score (0-10 with 10 being normal): 10 IMPRESSION: 1. No acute abnormality. 2. Chronic infarct right parietal lobe. Microvascular ischemic change in the white matter 3. ASPECTS is 10 4. These results were called by telephone at the time of interpretation on 09/10/2020 at 4:07 pm to provider Effie Shy , who verbally acknowledged these results. Electronically Signed   By: Marlan Palau M.D.   On: 09/10/2020 16:08   CT ANGIO HEAD NECK W WO CM W PERF  Result Date: 09/11/2020 CLINICAL DATA:  Progressive left-sided weakness. EXAM: CT ANGIOGRAPHY HEAD AND NECK CT PERFUSION BRAIN TECHNIQUE: Multidetector CT imaging of the head and neck was performed using the standard protocol during bolus administration of intravenous contrast. Multiplanar CT image reconstructions and MIPs were obtained to evaluate the vascular anatomy. Carotid stenosis measurements (when applicable) are obtained utilizing NASCET criteria, using the distal internal carotid diameter as the denominator. Multiphase CT imaging of the brain was performed following IV bolus contrast injection. Subsequent parametric perfusion maps were calculated using RAPID software. CONTRAST:  OMNIPAQUE IOHEXOL 350 MG/ML SOLN COMPARISON:  MR head without contrast, CT head without contrast, and CTA head and neck 09/10/2020. FINDINGS: CTA NECK FINDINGS Aortic arch: No significant interval change in the 3 vessel aortic arch with atherosclerotic calcifications. Right carotid system: The right common carotid artery is unremarkable. The right ICA is occluded at the bifurcation without significant reconstitution in the neck. Left carotid system: The left common carotid artery is within normal limits. Dense calcifications are present at the proximal ICA without a significant stenosis  relative to the more distal vessel. There is mild tortuosity without significant stenosis in the cervical ICA. Vertebral arteries: Left vertebral artery is dominant vessel. Both vertebral arteries originate from the subclavian arteries without significant stenosis. No significant stenosis is present in either vertebral artery in the neck. Skeleton: Multilevel degenerative changes are present. Anterior osteophytes are fused at C3-4. No significant interval change. Other neck: No acute abnormality or significant interval change. Upper chest: Lung apices are clear.  Thoracic inlet is normal. Review of the MIP images confirms the above findings CTA HEAD FINDINGS Anterior circulation: The right internal carotid artery remains occluded through the ophthalmic segment. Atherosclerotic changes are present the cavernous left ICA without significant stenosis through the ICA terminus. The left A1 segment and right A1 segment are similar the prior exam. Proximal right M1 segment narrowing has progressed. Is some irregularity in the left M1 segment without significant stenosis. There is significant loss of perfusion right MCA branch vessels compared to prior exam. Left MCA branch vessels remain intact. ACA vessels are unchanged. Posterior circulation: Moderate narrowing is present at the dural margin of the right vertebral artery, progressive from the prior exam. Left vertebral artery is unchanged. Vertebrobasilar junction normal. The basilar artery is small. Both posterior cerebral arteries originate from the basilar tip. Prominent right posterior communicating artery is present. There is segmental attenuation PCA branch vessels similar the prior study. Venous sinuses: Dural sinuses are patent. Straight sinus and deep cerebral veins are intact.  Anatomic variants: None Review of the MIP images confirms the above findings CT Brain Perfusion Findings: ASPECTS: 10/10 CBF (<30%) Volume: 0mL Perfusion (Tmax>6.0s) volume: 162mL  Mismatch Volume: 162mL Infarction Location:Right MCA territory IMPRESSION: 1. 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 2. 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 3. Progressive proximal right M1 segment stenosis with significant loss of perfusion in the right MCA branch vessels. 4. Stable atherosclerotic changes at the left carotid bifurcation without significant stenosis. 5. 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 Basilar artery is similar the prior exam. 6.  Aortic Atherosclerosis (ICD10-I70.0). Electronically Signed   By: Marin Robertshristopher  Mattern M.D.   On: 09/11/2020 20:25    Labs:  CBC: Recent Labs    09/10/20 1540 09/10/20 1559 09/12/20 0025 09/13/20 0312 09/14/20 0407  WBC 4.4  --  4.3 6.9 6.0  HGB 12.2 12.6 12.0 11.0* 9.9*  HCT 38.3 37.0 37.4 33.4* 30.8*  PLT 141*  --  156 145* 123*    COAGS: Recent Labs    09/10/20 1540  INR 1.0  APTT 26    BMP: Recent Labs    09/10/20 1540 09/10/20 1559 09/13/20 0312 09/14/20 0407  NA 141 143 138 138  K 3.3* 3.7 2.8* 3.7  CL 106 105 103 108  CO2 26  --  27 23  GLUCOSE 88 84 87 79  BUN 18 20 10 14   CALCIUM 8.7*  --  8.8* 8.7*  CREATININE 0.71 0.90 0.77 0.71  GFRNONAA >60  --  >60 >60    LIVER FUNCTION TESTS: Recent Labs    09/10/20 1540 09/13/20 0312 09/14/20 0407  BILITOT 0.4 0.2* 0.4  AST 17 16 15   ALT 8 8 8   ALKPHOS 67 60 50  PROT 7.1 6.6 5.8*  ALBUMIN 3.6 3.2* 2.9*    Assessment and Plan:  History of acute CVA s/p cerebral arteriogram with emergent mechanical thrombectomy of cervical right ICA and distal right MCA M1 occlusions achieving a TICI 3 revascularization, along with revascularization of right carotid bifurcation using stent assisted angioplasty via right femoral approach 09/11/2020 by Dr. Tommie Samsde Macedo Rodrigues. Awake and alert and follows simple commands, moving all  extremities with weakness of left side, mild LUE pronator drift. Right femoral puncture site stable, distal pulses (DPs) palpable bilaterally. Continue taking Plavix 75 mg once daily and Aspirin 81 mg once daily. Plan to follow-up with Dr. Tommie Samsde Macedo Rodrigues with carotid US followed by consult 3 months after discharge (NIR schedulers to call patient to set up these appointments). Further plans per TRH/neurology- appreciate and agree with management. Please call NIR with questions/concerns.   Electronically Signed: Elwin MochaAlexandra Marcile Fuquay, PA-C 09/14/2020, 9:36 AM   I spent a total of 15 Minutes at the the patient's bedside AND on the patient's hospital floor or unit, greater than 50% of which was counseling/coordinating care for CVA s/p revascularization.

## 2020-09-14 NOTE — Progress Notes (Addendum)
STROKE TEAM PROGRESS NOTE   HISTORY OF PRESENT ILLNESS (per record) [name] is a 75 y.o. female with a medical history significant for HTN, COPD, aortic aneurysm, DVT in pregnancy, remote smoking history, non-adherence to medications for the past year, and a remote MI who presented to [facility] 5/5 for evaluation of feeling dizzy and off balance for 2 - 3 weeks causing her to run into walls and bump her head.     Per Dr. Rollene Fare initial evaluation 5/5: "She recently moved from [place] so much of her care is outside of our system. She reports she has been feeling dizzy/off balance for 2 to 3 weeks. She has run into walls 2-3 times in the past week usually with a doorway towards the right and the wall on the left. She has also been having some intermittent left-sided weakness that has been coming and going. This morning she woke up particularly dizzy and off balance. EMS was activated around 1 PM but she refused transport initially. Family then convinced her to come to the hospital for further evaluation and therefore she presented around 3:30 PM. Initially she was neuro intact but then while in triage was noted to have developed a right-MCA syndrome for which code stroke was activated given she had been initially grossly nonfocal on triage exam.  LKW:1-2 weeks tpa given?: No,due to out of the window IR Thrombectomy? No,due to out of the window Modified Rankin Scale: 0-Completely asymptomatic and back to baseline post- stroke Time of teleneurologist evaluation:4:05 PM"  She was transferred to [facility] for further stroke evaluation and for close neuro monitoring with low threshold for stroke activation with further neurologic deficits. She does endorse headaches recently she feels are coming from her right ear that radiate to her right forehead and face. She states her last headache was yesterday 5/5 and denies current headache.    INTERVAL HISTORY patient appears to be doing well at  this time status post thrombectomy.    She is resting well.  Blood pressures are fine. Neurological exam is stable.  No changes   OBJECTIVE Vitals:   09/14/20 0500 09/14/20 0600 09/14/20 0700 09/14/20 0800  BP: (!) 142/48 (!) 131/113 (!) 160/55 (!) 154/50  Pulse: (!) 35 (!) 36 (!) 37 (!) 36  Resp: 18 19 18 20   Temp:      TempSrc:      SpO2: 99% 99% 99% 100%  Weight:      Height:        CBC:  Recent Labs  Lab 09/10/20 1540 09/10/20 1559 09/13/20 0312 09/14/20 0407  WBC 4.4   < > 6.9 6.0  NEUTROABS 1.9  --   --   --   HGB 12.2   < > 11.0* 9.9*  HCT 38.3   < > 33.4* 30.8*  MCV 90.5   < > 90.3 91.1  PLT 141*   < > 145* 123*   < > = values in this interval not displayed.    Basic Metabolic Panel:  Recent Labs  Lab 09/13/20 0312 09/14/20 0407  NA 138 138  K 2.8* 3.7  CL 103 108  CO2 27 23  GLUCOSE 87 79  BUN 10 14  CREATININE 0.77 0.71  CALCIUM 8.8* 8.7*  MG 1.9  --     Lipid Panel:     Component Value Date/Time   CHOL 230 (H) 09/12/2020 0025   TRIG 83 09/12/2020 0025   HDL 53 09/12/2020 0025   CHOLHDL 4.3 09/12/2020  0025   VLDL 17 09/12/2020 0025   LDLCALC 160 (H) 09/12/2020 0025   HgbA1c:  Lab Results  Component Value Date   HGBA1C 5.3 09/11/2020   Urine Drug Screen: No results found for: LABOPIA, COCAINSCRNUR, LABBENZ, AMPHETMU, THCU, LABBARB  Alcohol Level     Component Value Date/Time   ETH <10 09/10/2020 1540    IMAGING  CT HEAD CODE STROKE WO CONTRAST 09/11/2020 IMPRESSION:  1. Stable appearance of right parietal acute/subacute infarct.  2. No new infarct.  3. ASPECTS is 10/10.   CT ANGIO HEAD NECK W WO CM W PERF CT CEREBRAL PERFUSION W CONTRAST 09/11/2020 IMPRESSION:  1. 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  2. 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  3. Progressive proximal right M1 segment stenosis with significant loss of  perfusion in the right MCA branch vessels.  4. Stable atherosclerotic changes at the left carotid bifurcation without significant stenosis.  5. 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 Basilar artery is similar the prior exam.  6.  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 No evidence of hemorrhage.   ECHOCARDIOGRAM COMPLETE 09/12/2020 IMPRESSIONS   1. Left ventricular ejection fraction, by estimation, is 60 to 65%. The left ventricle has normal function. The left ventricle has no regional wall motion abnormalities. There is mild left ventricular hypertrophy. Left ventricular diastolic parameters are consistent with Grade I diastolic dysfunction (impaired relaxation).   2. Right ventricular systolic function is normal. The right ventricular size is normal. Tricuspid regurgitation signal is inadequate for assessing PA pressure.   3. Left atrial size was moderately dilated.   4. Right atrial size was mildly dilated.   5. The mitral valve is normal in structure. Trivial mitral valve regurgitation. No evidence of mitral stenosis.   6. The aortic valve is grossly normal. There is mild calcification of the aortic valve. Aortic valve regurgitation is not visualized. No aortic stenosis is present.   7. Aortic dilatation noted. There is borderline dilatation of the ascending aorta, measuring 39 mm.   8. The inferior vena cava is normal in size with greater than 50% respiratory variability, suggesting right atrial pressure of 3 mmHg.  Conclusion(s)/Recommendation(s): No intracardiac source of embolism detected on this transthoracic study. A transesophageal echocardiogram is recommended to exclude cardiac source of embolism if clinically indicated.    ECG - SB rate 57 BPM.  (See cardiology reading for complete details)  PHYSICAL EXAM Blood pressure (!) 154/50, pulse (!) 36, temperature 98 F (36.7 C), temperature source Axillary, resp. rate 20, height 5\' 10"  (1.778 m), weight 95.3 kg, SpO2 100 %.  GENERAL: She is resting well but responsive.  HEENT: Neck is supple no trauma noted.  EXTREMITIES: No edema   BACK: Normal  SKIN: Normal by inspection.    MENTAL STATUS: She is awake alert and interactive She is oriented to hospital, year, month and medical condition.  She follows commands well.  CRANIAL NERVES: Pupils are equal, round and reactive to light and accomodation; extra ocular movements are full, there is no significant nystagmus; visual fields are full; upper and lower facial muscles are normal in strength and symmetric, there is no flattening of the nasolabial folds; tongue is midline; uvula is midline; shoulder  elevation is normal.  MOTOR:  Right side is graded as 4+/5.  Left upper extremity 4 - and left leg 3.  There is mild drift of the left arm and left leg.  COORDINATION: Left finger to nose is normal, right finger to nose is normal, No rest tremor; no intention tremor; no postural tremor; no bradykinesia.  Gait not tested  ASSESSMENT/PLAN Ms. Maurica Omura is a 75 y.o. female with history of HTN, COPD, aortic aneurysm, DVT in pregnancy, remote smoking history, non-adherence to medications for the past year, and a remote MI who presented to [facility] 5/5 for evaluation of feeling dizzy, recent headaches and off balance for 2 - 3 weeks causing her to run into walls and bump her head.  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 Mechanical thrombectomy performed with combined stent retriever (embotrap) and direct contact aspiration with complete recanalization (TICI3) 09/11/2020  - Dr.  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 (consider TEE)  Resultant left-sided weakness that is mild.  Code Stroke CT Head - Stable appearance of right parietal acute/subacute infarct. No new infarct. ASPECTS is 10/10.   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 Progressive proximal right M1 segment stenosis with significant loss of perfusion in the right MCA branch vessels. 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 Basilar artery is similar the prior exam.   CT Perfusion - Right MCA territory ischemia with 162 mL of surrounding penumbra.    Carotid Doppler - pending  2D Echo - EF 60 - 65%. No cardiac source of emboli identified.   Sars Corona Virus 2 - negative  LDL - 160  HgbA1c - 5.3  UDS - not ordered  VTE prophylaxis - SCDs Diet  Diet Order            Diet regular Room service appropriate? Yes with Assist; Fluid consistency: Thin  Diet effective now                 No antithrombotic prior to admission, now on aspirin 81 mg daily and clopidogrel 75 mg daily   Patient will be counseled to be compliant with her antithrombotic medications  Ongoing aggressive stroke risk factor management  Therapy recommendations:  CIR recommended - Rehabilitation MD consult ordered  Disposition:  Pending  Hypertension  Home BP meds: none   Current BP meds: Zestril ; Cleviprex and hydralazine prn  Stable  SBP 160 mmHg. Marland Kitchen Long-term BP goal normotensive  Hyperlipidemia  Home Lipid lowering medication: none   LDL 160, goal < 70  Current lipid lowering medication: Lipitor 80 mg daily   Continue statin at  discharge  Other Stroke Risk Factors  Advanced age  Former cigarette smoker - quit  Obesity, Body mass index is 30.13 kg/m., recommend weight loss, diet and exercise as appropriate   Coronary artery disease  Medical non compliance  Other Active Problems, Findings, Recommendations and/or Plan  Code status - Full code  Aortic Atherosclerosis (ICD10-I70.0)  Bradycardia - 30's - 40's   Talaysha Freeberg is an 75 y.o. female.   ASSESSMENT/PLAN: 1.  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  Status post thrombectomy at both the ICA and MCA levels with good recanalization.  Risk factors hypertension and age.  Repeat imaging as per protocol.   Dual antiplatelet agents are recommended for 3 months.  A statin is also recommended along with blood pressure control.  Continue mobilization out of bed.  Ongoing therapy consults.  Transfer to neurology floor bed later today if available.  Plan dual antiplatelet therapy aspirin 81 mg and Brilinta 90 mg twice daily for 3 months followed by aspirin alone given angioplasty and stenting of the right carotid bifurcation need outpatient loop recorder to look for paroxysmal A. fib at discharge.  This patient is critically ill due to stroke post procedure and at significant risk of neurological worsening, death form severe anemia, bleeding, recurrent stroke, intracranial stenosis. This patient's care requires constant monitoring of vital signs, hemodynamics, respiratory and cardiac monitoring, review of multiple databases, neurological assessment, other specialists and medical decision making of high complexity. I spent34minutes of neurocritical care time in the care of this patient Delia Heady, MD Hospital day # 4    To contact Stroke Continuity provider, please refer to [email or web address]. After hours, contact General Neurology

## 2020-09-14 NOTE — TOC Initial Note (Signed)
Transition of Care [facility]) - Initial/Assessment Note    Patient Details  Name: [name] MRN: [id number] Date of Birth: [birth year]  [facility] CM/SW Contact:    Mearl Latin, LCSW Phone Number: 09/14/2020, 5:47 PM  Clinical Narrative:                 TOC following for discharge needs once medically stable. CIR also following.   Expected Discharge Plan: IP Rehab Facility Barriers to Discharge: Continued Medical Work up   Patient Goals and CMS Choice        Expected Discharge Plan and Services Expected Discharge Plan: IP Rehab Facility In-house Referral: Clinical Social Work   Post Acute Care Choice: IP Rehab Living arrangements for the past 2 months: Single Family Home                                      Prior Living Arrangements/Services Living arrangements for the past 2 months: Single Family Home   Patient language and need for interpreter reviewed:: Yes        Need for Family Participation in Patient Care: Yes (Comment) Care giver support system in place?: Yes (comment)   Criminal Activity/Legal Involvement Pertinent to Current Situation/Hospitalization: No - Comment as needed  Activities of Daily Living Home Assistive Devices/Equipment: None ADL Screening (condition at time of admission) Patient's cognitive ability adequate to safely complete daily activities?: Yes Is the patient deaf or have difficulty hearing?: No Does the patient have difficulty seeing, even when wearing glasses/contacts?: No Does the patient have difficulty concentrating, remembering, or making decisions?: No Patient able to express need for assistance with ADLs?: Yes Does the patient have difficulty dressing or bathing?: No Independently performs ADLs?: Yes (appropriate for developmental age) Communication: Independent Dressing (OT): Independent Grooming: Independent Feeding: Independent Bathing: Independent Toileting: Independent In/Out Bed:  Independent Walks in Home: Independent Does the patient have difficulty walking or climbing stairs?: Yes Weakness of Legs: None Weakness of Arms/Hands: None  Permission Sought/Granted                  Emotional Assessment Appearance:: Appears stated age     Orientation: : Oriented to Self,Oriented to Place,Oriented to  Time,Oriented to Situation Alcohol / Substance Use: Not Applicable Psych Involvement: No (comment)  Admission diagnosis:  TIA (transient ischemic attack) [G45.9] Patient Active Problem List   Diagnosis Date Noted  . TIA (transient ischemic attack) 09/10/2020   PCP:  Patient, No Pcp Per (Inactive) Pharmacy:   [facility] [address] [place] - [address] [place] [address] Phone: [phone] Fax: [phone]  [facility] [address], [place] - [address] [place] [address] Phone: [phone] Fax: [phone]  [id number] [place], [place] - [address] AT [facility] [address] [place] [address] Phone: [phone] Fax: [phone]     Social Determinants of Health (SDOH) Interventions    Readmission Risk Interventions No flowsheet data found.

## 2020-09-15 ENCOUNTER — Inpatient Hospital Stay (HOSPITAL_COMMUNITY): Payer: Medicare Other

## 2020-09-15 LAB — CBC
HCT: 31.5 % — ABNORMAL LOW (ref 36.0–46.0)
Hemoglobin: 10.3 g/dL — ABNORMAL LOW (ref 12.0–15.0)
MCH: 28.9 pg (ref 26.0–34.0)
MCHC: 32.7 g/dL (ref 30.0–36.0)
MCV: 88.5 fL (ref 80.0–100.0)
Platelets: 147 10*3/uL — ABNORMAL LOW (ref 150–400)
RBC: 3.56 MIL/uL — ABNORMAL LOW (ref 3.87–5.11)
RDW: 12.8 % (ref 11.5–15.5)
WBC: 5.7 10*3/uL (ref 4.0–10.5)
nRBC: 0 % (ref 0.0–0.2)

## 2020-09-15 LAB — COMPREHENSIVE METABOLIC PANEL
ALT: 9 U/L (ref 0–44)
AST: 14 U/L — ABNORMAL LOW (ref 15–41)
Albumin: 3 g/dL — ABNORMAL LOW (ref 3.5–5.0)
Alkaline Phosphatase: 62 U/L (ref 38–126)
Anion gap: 8 (ref 5–15)
BUN: 11 mg/dL (ref 8–23)
CO2: 25 mmol/L (ref 22–32)
Calcium: 8.9 mg/dL (ref 8.9–10.3)
Chloride: 105 mmol/L (ref 98–111)
Creatinine, Ser: 0.64 mg/dL (ref 0.44–1.00)
GFR, Estimated: >60 mL/min (ref >60)
Glucose, Bld: 88 mg/dL (ref 70–99)
Potassium: 3.1 mmol/L — ABNORMAL LOW (ref 3.5–5.1)
Sodium: 138 mmol/L (ref 135–145)
Total Bilirubin: 0.6 mg/dL (ref 0.3–1.2)
Total Protein: 6.2 g/dL — ABNORMAL LOW (ref 6.5–8.1)

## 2020-09-15 LAB — MAGNESIUM: Magnesium: 1.7 mg/dL (ref 1.7–2.4)

## 2020-09-15 MED ORDER — MAGNESIUM SULFATE 2 GM/50ML IV SOLN
2.0000 g | Freq: Once | INTRAVENOUS | Status: AC
  Administered 2020-09-15: 2 g via INTRAVENOUS
  Filled 2020-09-15: qty 50

## 2020-09-15 MED ORDER — POTASSIUM CHLORIDE CRYS ER 20 MEQ PO TBCR
40.0000 meq | EXTENDED_RELEASE_TABLET | ORAL | Status: AC
  Administered 2020-09-15 (×2): 40 meq via ORAL
  Filled 2020-09-15 (×2): qty 2

## 2020-09-15 MED ORDER — LISINOPRIL 10 MG PO TABS: 10.0000 mg | ORAL_TABLET | Freq: Every day | ORAL | Status: DC

## 2020-09-15 MED ORDER — LABETALOL HCL 5 MG/ML IV SOLN
20.0000 mg | INTRAVENOUS | Status: DC | PRN
  Administered 2020-09-16: 20 mg via INTRAVENOUS
  Filled 2020-09-15 (×2): qty 4

## 2020-09-15 MED ORDER — BENZONATATE 100 MG PO CAPS
100.0000 mg | ORAL_CAPSULE | Freq: Three times a day (TID) | ORAL | Status: DC | PRN
  Filled 2020-09-15: qty 1

## 2020-09-15 MED ORDER — AMLODIPINE BESYLATE 2.5 MG PO TABS: 2.5000 mg | ORAL_TABLET | Freq: Every day | ORAL | Status: DC

## 2020-09-15 NOTE — Progress Notes (Signed)
Inpatient Rehab Admissions Coordinator:   Left message with daughter to discuss CIR.   Estill Dooms, PT, DPT Admissions Coordinator [phone] 09/15/20  12:44 PM

## 2020-09-15 NOTE — Progress Notes (Signed)
  Speech Language Pathology Treatment: Dysphagia  Patient Details Name: [name] MRN: [id number] DOB: [birth year] Today's Date: 09/15/2020 Time: 0951-1000 SLP Time Calculation (min) (ACUTE ONLY): 9 min  Assessment / Plan / Recommendation Clinical Impression  Pt was independent with consumption of thin liquids with a softer consistency remaining on breakfast tray (muffin). Oral and pharyngeal stages was without concern and recommend continue regular/thin. She denies coughing or other difficulty with meals past several days. She may need assist to manipulate packages. No further needs for swallow ability.    HPI HPI: 75 yo female [facility] with 2-3 week history of dizziness, unsteadiness. 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 Rapid response called 5/6 due to new onset L sided weakness, pt taken to IR for thrombectomy of R ICA/MCA occlusions. PMH significant for hypertension, COPD, aortic aneurysm, DVT in pregnancy, former smoking, nonadherence to medications for the past year, remote MI.      SLP Plan  All goals met;Discharge SLP treatment due to (comment)  Patient needs continued Speech Lanaguage Pathology Services    Recommendations  Diet recommendations: Regular;Thin liquid Liquids provided via: Straw;Cup Medication Administration: Whole meds with liquid Supervision: Patient able to self feed;Staff to assist with self feeding (manage packages if needed) Compensations: Slow rate;Small sips/bites Postural Changes and/or Swallow Maneuvers: Seated upright 90 degrees                Oral Care Recommendations: Oral care BID Follow up Recommendations: None SLP Visit Diagnosis: Dysphagia, unspecified (R13.10) Plan: All goals met;Discharge SLP treatment due to  (comment)       GO                Houston Siren 09/15/2020, 10:25 AM

## 2020-09-15 NOTE — Progress Notes (Signed)
Physical Therapy Treatment Patient Details Name: [name] MRN: [id number] DOB: May 27, 1945 Today's Date: 09/15/2020    History of Present Illness 75 yo female presents to [facility] on 5/5 with 2-3 week history of dizziness, unsteadiness. 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 Rapid response called 5/6 due to new onset L sided weakness, pt taken to IR for thrombectomy of R ICA/MCA occlusions. PMH significant for hypertension, COPD, aortic aneurysm, DVT in pregnancy, former smoking, nonadherence to medications for the past year, remote MI.    PT Comments    Pt progressing steadily towards her physical therapy goals. Received asleep with urine incontinence, without awareness. Assisted to bathroom for toileting and gown change. Pt requiring min-mod assist for transfers, ambulating x 120 feet with a walker at a min assist level. Continues with dynamic balance deficits, L weakness, L inattention, decreased gait speed, gait abnormalities. Pt running into objects on the left, requiring verbal cues and walker assist to maintain a straight path. Continue to recommend comprehensive inpatient rehab (CIR) for post-acute therapy needs.    Follow Up Recommendations  CIR     Equipment Recommendations  None recommended by PT    Recommendations for Other Services       Precautions / Restrictions Precautions Precautions: Fall Precaution Comments: L inattention Restrictions Weight Bearing Restrictions: No    Mobility  Bed Mobility Overal bed mobility: Needs Assistance Bed Mobility: Supine to Sit     Supine to sit: Min assist     General bed mobility comments: MinA for trunk assist to upright    Transfers Overall transfer level: Needs assistance Equipment used: Rolling walker (2  wheeled) Transfers: Sit to/from Stand Sit to Stand: Mod assist;Min assist         General transfer comment: Pt requiring modA to rise from edge of bed, minA to stand from toilet height  Ambulation/Gait Ambulation/Gait assistance: Min assist Gait Distance (Feet): 120 Feet Assistive device: Rolling walker (2 wheeled) Gait Pattern/deviations: Decreased stride length;Decreased dorsiflexion - left;Shuffle;Drifts right/left;Trunk flexed;Step-through pattern     General Gait Details: Cues for increased left foot clearance, neutral positioning, walker proximity, maintaining a straight path. Pt requiring minA for balance and walker negotiation, tends to run into objects on left. Tendency for downward gaze   Stairs             Wheelchair Mobility    Modified Rankin (Stroke Patients Only) Modified Rankin (Stroke Patients Only) Pre-Morbid Rankin Score: No symptoms Modified Rankin: Moderately severe disability     Balance Overall balance assessment: Needs assistance Sitting-balance support: Feet supported Sitting balance-Leahy Scale: Fair     Standing balance support: Bilateral upper extremity supported Standing balance-Leahy Scale: Poor Standing balance comment: reliant on RW                            Cognition Arousal/Alertness: Awake/alert Behavior During Therapy: Flat affect Overall Cognitive Status: Impaired/Different from baseline Area of Impairment: Attention;Safety/judgement;Awareness;Problem solving                   Current Attention Level: Sustained Memory: Decreased short-term memory;Decreased recall of precautions Following Commands: Follows one step commands with increased time Safety/Judgement: Decreased awareness of deficits;Decreased awareness of safety Awareness: Emergent Problem Solving: Requires verbal cues;Requires tactile cues;Slow processing;Decreased initiation  General Comments: Pt received asleep, minimal interaction with  therapist throughout session, following 1 step commands consistently. Decreased left sided awareness      Exercises      General Comments        Pertinent Vitals/Pain Pain Assessment: No/denies pain    Home Living                      Prior Function            PT Goals (current goals can now be found in the care plan section) Acute Rehab PT Goals Patient Stated Goal: did not state PT Goal Formulation: With patient Time For Goal Achievement: 09/26/20 Potential to Achieve Goals: Good Progress towards PT goals: Progressing toward goals    Frequency    Min 4X/week      PT Plan Current plan remains appropriate    Co-evaluation              AM-PAC PT "6 Clicks" Mobility   Outcome Measure  Help needed turning from your back to your side while in a flat bed without using bedrails?: A Little Help needed moving from lying on your back to sitting on the side of a flat bed without using bedrails?: A Little Help needed moving to and from a bed to a chair (including a wheelchair)?: A Little Help needed standing up from a chair using your arms (e.g., wheelchair or bedside chair)?: A Lot Help needed to walk in hospital room?: A Little Help needed climbing 3-5 steps with a railing? : A Lot 6 Click Score: 16    End of Session Equipment Utilized During Treatment: Gait belt Activity Tolerance: Patient tolerated treatment well;Patient limited by fatigue Patient left: in chair;with call bell/phone within reach;with chair alarm set;with family/visitor present Nurse Communication: Mobility status PT Visit Diagnosis: Other abnormalities of gait and mobility (R26.89);Difficulty in walking, not elsewhere classified (R26.2);Other symptoms and signs involving the nervous system (R29.898)     Time: 7672-0947 PT Time Calculation (min) (ACUTE ONLY): 25 min  Charges:  $Gait Training: 8-22 mins $Therapeutic Activity: 8-22 mins                     Lillia Pauls, PT,  DPT Acute Rehabilitation Services Pager [phone] Office [phone]    Norval Morton 09/15/2020, 3:26 PM

## 2020-09-15 NOTE — Progress Notes (Signed)
PROGRESS NOTE    [name]  ERX:[id number] DOB: [birth year] DOA: 09/10/2020 PCP: Patient, No Pcp Per (Inactive)    Brief Narrative:  75 y.o. female with medical history significant of HTN, not on any antihypertensive medications.  Patient reports she is from [place] and last PCP visit was more than a year ago and she is without medication for that duration.  She complains of having dizziness and unsteady gait for 2 weeks which has been worse in the morning today which brought her to the emergency room.  After she arrived in the ED she has developed left-sided weakness, left-sided facial droop and left neglect.  Stroke code was activated. Exact time of onset of symptoms unknown since patient has been having unsteady gait for few days, Patient also reports intermittent headache because of uncontrolled hypertension.  Patient symptoms has resolved within an hour.  Patient was seen by tele neurologist Dr. Iver Nestle, recommended  stroke work-up, dual antiplatelet agents , aspirin and Plavix, statins.  Patient denies any recent travel, sick contacts, cough, weight loss, chest pain, shortness of breath.   ED Course: She was hemodynamically stable except hypertension.   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 At least partial reconstitution at the clinoid intracranially. Less than 50% stenosis at the left ICA origin.  Assessment & Plan:   Active Problems:   TIA (transient ischemic attack)  Dizziness /unsteady gait secondary to  acute and subacute R parietal infarct:  -Initial MRI brain reviewed. Findings of R parietal small to moderate size acute and subacute infarct with few additional punctate acute to subacute R centrum semiovale infarcts noted  -CTA head and neck: Age indeterminate occlusion of right cervical ICA just beyond the origin.  Less than 50% stenosis of left ICA origin -Continue Lipitor 80 mg daily -Evens from 5/6 noted. On the evening of 5/6, patient was noted to have acute weakness on the L, prompting code stroke. Pt underwent emergent thrombectomy, since transferred to ICU -Neurology had initially recommended ASA 81mg  with Brilinta 90mg  bid x 3 months followed by ASA alone -Overnight, pt noted to have R hand pain and numbness. CT head reviewed with findings of subarachnoid hemorrhage involving R sylvian fissure with evolving R MCA infarcts -Discussed with Neurology. Recommendation to hold further antiplatelet for now, with plan for possible repeat head CT in AM for interval change -Will keep in neuro ICU for now  Hypertension: -No bp meds per home med rec prior to admit -Initially started lisinopril with norvasc added -given concerns of neuro changes overnight, have held lisinopril -Continue PRN hydralazine -BP stable thus far -Of note, pt today has a new dry cough. Question if related to recent ACEI. May consider alternative regimen if needed  QT prolongation: -Initial EKG with QTc of 550 -Repeat EKG with qtc of 487 -Would continue to replace lytes as needed  DVT prophylaxis: SCD's Code Status: Full Family Communication: Pt in room, family not at bedside  Status is: Inpatient  Remains inpatient appropriate because:Inpatient level of care appropriate due to severity of illness  Dispo: The  patient is from: Home              Anticipated d/c is to: Home              Patient currently is not medically stable to d/c.   Difficult to place patient No   Consultants:    Neurology  IR  Procedures:   Emergent thrombectomy 5/6  Antimicrobials: Anti-infectives (From admission, onward)   None      Subjective: Reports having dry cough that started this AM  Objective: Vitals:   09/15/20 1200 09/15/20 1230 09/15/20 1300 09/15/20 1400  BP: (!) 146/61 (!) 150/57 132/68 115/82  Pulse: (!) 54 (!) 51 80 73  Resp: 18 17 (!) 21 18  Temp: 97.7 F (36.5 C)     TempSrc: Oral     SpO2: 98% 98% 98% 99%  Weight:      Height:        Intake/Output Summary (Last 24 hours) at 09/15/2020 1527 Last data filed at 09/15/2020 0600 Gross per 24 hour  Intake 1.21 ml  Output 300 ml  Net -298.79 ml   Filed Weights   09/10/20 1454  Weight: 95.3 kg    Examination: General exam: Awake, laying in bed, in nad Respiratory system: Normal respiratory effort, no wheezing Cardiovascular system: regular rate, s1, s2 Gastrointestinal system: Soft, nondistended, positive BS Central nervous system: CN2-12 grossly intact, strength intact Extremities: Perfused, no clubbing Skin: Normal skin turgor, no notable skin lesions seen Psychiatry: Mood normal // no visual hallucinations   Data Reviewed: I have personally reviewed following labs and imaging studies  CBC: Recent Labs  Lab 09/10/20 1540 09/10/20 1559 09/12/20 0025 09/13/20 0312 09/14/20 0407 09/15/20 0410  WBC 4.4  --  4.3 6.9 6.0 5.7  NEUTROABS 1.9  --   --   --   --   --   HGB 12.2 12.6 12.0 11.0* 9.9* 10.3*  HCT 38.3 37.0 37.4 33.4* 30.8* 31.5*  MCV 90.5  --  89.9 90.3 91.1 88.5  PLT 141*  --  156 145* 123* 147*   Basic Metabolic Panel: Recent Labs  Lab 09/10/20 1540 09/10/20 1559 09/13/20 0312 09/14/20 0407 09/15/20 0410  NA 141 143 138 138 138  K 3.3* 3.7 2.8* 3.7 3.1*  CL 106 105 103 108 105  CO2 26  --  27 23 25   GLUCOSE 88 84 87 79 88  BUN 18 20 10 14 11   CREATININE 0.71 0.90 0.77 0.71 0.64  CALCIUM 8.7*  --  8.8* 8.7* 8.9  MG  --   --  1.9  --  1.7   GFR: Estimated Creatinine  Clearance: 74.8 mL/min (by C-G formula based on SCr of 0.64 mg/dL). Liver Function Tests: Recent Labs  Lab 09/10/20 1540 09/13/20 0312 09/14/20 0407 09/15/20 0410  AST 17 16 15  14*  ALT 8 8 8 9   ALKPHOS 67 60 50 62  BILITOT 0.4 0.2* 0.4 0.6  PROT 7.1 6.6 5.8* 6.2*  ALBUMIN 3.6 3.2* 2.9* 3.0*   No results for input(s): LIPASE, AMYLASE in the last 168 hours. No results for input(s): AMMONIA in the last 168 hours. Coagulation Profile: Recent Labs  Lab 09/10/20 1540  INR 1.0   Cardiac Enzymes: Recent Labs  Lab 09/12/20 0025  CKTOTAL 78   BNP (last 3 results) No results for input(s): PROBNP in the last 8760 hours. HbA1C: No results for input(s): HGBA1C in the last 72 hours. CBG: Recent Labs  Lab 09/10/20 1544 09/11/20  1931  GLUCAP 87 107*   Lipid Profile: No results for input(s): CHOL, HDL, LDLCALC, TRIG, CHOLHDL, LDLDIRECT in the last 72 hours. Thyroid Function Tests: No results for input(s): TSH, T4TOTAL, FREET4, T3FREE, THYROIDAB in the last 72 hours. Anemia Panel: No results for input(s): VITAMINB12, FOLATE, FERRITIN, TIBC, IRON, RETICCTPCT in the last 72 hours. Sepsis Labs: No results for input(s): PROCALCITON, LATICACIDVEN in the last 168 hours.  Recent Results (from the past 240 hour(s))  Resp Panel by RT-PCR (Flu A&B, Covid) Nasopharyngeal Swab     Status: None   Collection Time: 09/10/20  4:27 PM   Specimen: Nasopharyngeal Swab; Nasopharyngeal(NP) swabs in vial transport medium  Result Value Ref Range Status   SARS Coronavirus 2 by RT PCR NEGATIVE NEGATIVE Final    Comment: (NOTE) SARS-CoV-2 target nucleic acids are NOT DETECTED.  The SARS-CoV-2 RNA is generally detectable in upper respiratory specimens during the acute phase of infection. The lowest concentration of SARS-CoV-2 viral copies this assay can detect is 138 copies/mL. A negative result does not preclude SARS-Cov-2 infection and should not be used as the sole basis for treatment or other  patient management decisions. A negative result may occur with  improper specimen collection/handling, submission of specimen other than nasopharyngeal swab, presence of viral mutation(s) within the areas targeted by this assay, and inadequate number of viral copies(<138 copies/mL). A negative result must be combined with clinical observations, patient history, and epidemiological information. The expected result is Negative.  Fact Sheet for Patients:  BloggerCourse.comhttps://www.fda.gov/media/152166/download  Fact Sheet for Healthcare Providers:  SeriousBroker.ithttps://www.fda.gov/media/152162/download  This test is no t yet approved or cleared by the [place] FDA and  has been authorized for detection and/or diagnosis of SARS-CoV-2 by FDA under an Emergency Use Authorization (EUA). This EUA will remain  in effect (meaning this test can be used) for the duration of the COVID-19 declaration under Section 564(b)(1) of the Act, 21 U.S.C.section 360bbb-3(b)(1), unless the authorization is terminated  or revoked sooner.       Influenza A by PCR NEGATIVE NEGATIVE Final   Influenza B by PCR NEGATIVE NEGATIVE Final    Comment: (NOTE) The Xpert Xpress SARS-CoV-2/FLU/RSV plus assay is intended as an aid in the diagnosis of influenza from Nasopharyngeal swab specimens and should not be used as a sole basis for treatment. Nasal washings and aspirates are unacceptable for Xpert Xpress SARS-CoV-2/FLU/RSV testing.  Fact Sheet for Patients: BloggerCourse.comhttps://www.fda.gov/media/152166/download  Fact Sheet for Healthcare Providers: SeriousBroker.ithttps://www.fda.gov/media/152162/download  This test is not yet approved or cleared by the [place] FDA and has been authorized for detection and/or diagnosis of SARS-CoV-2 by FDA under an Emergency Use Authorization (EUA). This EUA will remain in effect (meaning this test can be used) for the duration of the COVID-19 declaration under Section 564(b)(1) of the Act, 21 U.S.C. section  360bbb-3(b)(1), unless the authorization is terminated or revoked.  Performed at [facility], [address] [place], [place] [address]      Radiology Studies: CT HEAD WO CONTRAST  Result Date: 09/15/2020 CLINICAL DATA:  Initial evaluation for new onset right arm weakness, history of recent stroke. EXAM: CT HEAD WITHOUT CONTRAST TECHNIQUE: Contiguous axial images were obtained from the base of the skull through the vertex without intravenous contrast. COMPARISON:  Prior studies from 09/11/2020. FINDINGS: Brain: Patchy hypodensities involving the right basal ganglia and posterior right parieto-occipital region, consistent with evolving acute to subacute right MCA distribution infarcts. Since previous exam, there has been interval development of scattered subarachnoid hemorrhage  involving the right sylvian fissure and overlying right frontal cortical sulci. Small volume intraventricular hemorrhage seen as well, with blood seen layering within the occipital horns of both lateral ventricles, likely related to redistribution. No hydrocephalus or ventricular trapping. No significant mass effect or midline shift. Basilar cisterns remain patent. No other definite acute large vessel territory infarct. No mass lesion or extra-axial fluid collection. Underlying atrophy with chronic microvascular ischemic disease again noted. Vascular: No visible hyperdense vessel. Skull: Scalp soft tissues and calvarium within normal limits. Sinuses/Orbits: Globes and orbital soft tissues within normal limits. Moderate mucosal thickening in opacity seen throughout the paranasal sinuses, with air-fluid levels present within both maxillary sinuses. Small right mastoid effusion noted. Other: None. IMPRESSION: 1. Interval development of scattered subarachnoid hemorrhage involving the right Sylvian fissure and overlying right frontal cortical sulci. Small volume intraventricular hemorrhage seen as well, likely  related to redistribution. No hydrocephalus or ventricular trapping. 2. Evolving acute to subacute right MCA distribution infarcts. No significant mass effect or midline shift. 3. No other new acute intracranial abnormality. 4. Underlying atrophy with chronic microvascular ischemic disease. Critical Value/emergent results were called by telephone at the time of interpretation on 09/15/2020 at 2:18 am to provider [facility] , who verbally acknowledged these results. Electronically Signed   By: Rise Mu M.D.   On: 09/15/2020 02:33    Scheduled Meds: . [START ON 09/16/2020] amLODipine  2.5 mg Oral Daily  . atorvastatin  80 mg Oral QHS  . Chlorhexidine Gluconate Cloth  6 each Topical Daily  . [START ON 09/16/2020] lisinopril  10 mg Oral Daily  . neomycin-polymyxin-hydrocortisone  3 drop Right EAR Q6H  . potassium chloride  40 mEq Oral Q4H   Continuous Infusions: . sodium chloride Stopped (09/11/20 0900)  . sodium chloride    . clevidipine Stopped (09/13/20 1736)     LOS: 5 days   Rickey Barbara, MD [facility] Pager On Amion  If 7PM-7AM, please contact night-coverage 09/15/2020, 3:27 PM

## 2020-09-15 NOTE — Progress Notes (Signed)
Inpatient Rehab Admissions Coordinator:   I spoke to pt's daughter on the phone to review CIR goals/expectations.  We discussed supervision to min assist level goals with estimated length of stay to be about 3 weeks.  Plan for pt to discharge home with her daughter, who can provide 24/7 assist.  Will follow for potential admit when medically ready pending bed availability.   Estill Dooms, PT, DPT Admissions Coordinator [phone] 09/15/20  1:27 PM

## 2020-09-15 NOTE — Progress Notes (Signed)
Repeat CT ordered due to some righ thand pain/weakness. It demonstrates predominantly extraxial hemorrhage on the right, but I suspect this is asymptomatic. Will hold anti-platelets for now.   Ritta Slot, MD [facility] [phone]  If 7pm- 7am, please page neurology on call as listed in AMION.

## 2020-09-15 NOTE — Evaluation (Signed)
Speech Language Pathology Evaluation Patient Details Name: [name] MRN: [id number] DOB: 1946-03-19 Today's Date: 09/15/2020 Time: 0951-1000 SLP Time Calculation (min) (ACUTE ONLY): 9 min  Problem List:  Patient Active Problem List   Diagnosis Date Noted  . TIA (transient ischemic attack) 09/10/2020   Past Medical History:  Past Medical History:  Diagnosis Date  . COPD (chronic obstructive pulmonary disease) (HCC)   . History of DVT (deep vein thrombosis)   . Hypertension   . 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 IR CT HEAD LTD  09/11/2020  . IR INTRAVSC STENT CERV CAROTID W/O EMB-PROT MOD SED INC ANGIO  09/11/2020  . IR PERCUTANEOUS ART THROMBECTOMY/INFUSION INTRACRANIAL INC DIAG ANGIO  09/11/2020      . IR PERCUTANEOUS ART THROMBECTOMY/INFUSION INTRACRANIAL INC DIAG ANGIO  09/11/2020  . IR US GUIDE VASC ACCESS RIGHT  09/11/2020  . NO PAST SURGERIES    . RADIOLOGY WITH ANESTHESIA N/A 09/11/2020   Procedure: IR WITH ANESTHESIA;  Surgeon: Julieanne Cotton, MD;  Location: [facility] OR;  Service: Radiology;  Laterality: N/A;   HPI:  75 yo female presents to [facility] on 5/5 with 2-3 week history of dizziness, unsteadiness. 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 Rapid response called 5/6 due to new onset L sided weakness, pt taken to IR for thrombectomy of R ICA/MCA occlusions. PMH significant for hypertension, COPD, aortic aneurysm, DVT in pregnancy, former smoking, nonadherence to medications for the past year, remote MI.   Assessment / Plan / Recommendation Clinical Impression  [name] is 75 year old stating she lives with her daughter, takes no medications, responsible for finances and mostly stays around the  house during the day. Speech is clear, intelligible and receptive/expressive language intact. Areas of cognitive focus as indicated by 20/30 on SLUMS will be working memory, anticipatory awareness, thought organization and problem solving. Facial symmetry with questionable slight left weakness but appears functional. ST will see on acute and recommend CIR.    SLP Assessment  SLP Recommendation/Assessment: Patient needs continued Speech Lanaguage Pathology Services SLP Visit Diagnosis: Cognitive communication deficit (R41.841)    Follow Up Recommendations  Inpatient Rehab    Frequency and Duration min 2x/week  2 weeks      SLP Evaluation Cognition  Overall Cognitive Status: Impaired/Different from baseline Arousal/Alertness: Awake/alert Orientation Level: Oriented X4 Attention: Sustained Sustained Attention: Appears intact Memory: Impaired Memory Impairment: Retrieval deficit Awareness: Impaired Awareness Impairment: Anticipatory impairment Problem Solving: Impaired Problem Solving Impairment: Verbal complex Safety/Judgment: Other (comment) (functional for basic safety)       Comprehension  Auditory Comprehension Overall Auditory Comprehension: Appears within functional limits for tasks assessed Visual Recognition/Discrimination Discrimination: Not tested Reading Comprehension Reading Status: Not tested    Expression Expression Primary Mode of Expression: Verbal Verbal Expression Overall Verbal Expression: Appears within functional limits for tasks assessed Pragmatics: No impairment Written Expression Dominant Hand: Right Written Expression: Not tested   Oral / Motor  Oral Motor/Sensory Function Overall Oral Motor/Sensory Function: Within functional limits (questionable trace weakness on left) Motor Speech Overall Motor Speech: Appears within functional limits for tasks assessed Intelligibility: Intelligible   GO                    [name],  Breck Coons 09/15/2020, 10:19 AM   Breck Coons [name].Ed Nurse, children's [phone] Office [phone]

## 2020-09-15 NOTE — Progress Notes (Shared)
STROKE TEAM PROGRESS NOTE   HISTORY OF PRESENT ILLNESS (per record) [name] is a 75 y.o. female with a medical history significant for HTN, COPD, aortic aneurysm, DVT in pregnancy, remote smoking history, non-adherence to medications for the past year, and a remote MI who presented to [facility] 5/5 for evaluation of feeling dizzy and off balance for 2 - 3 weeks causing her to run into walls and bump her head.     Per Dr. Rollene Fare initial evaluation 5/5: "She recently moved from [place] so much of her care is outside of our system. She reports she has been feeling dizzy/off balance for 2 to 3 weeks. She has run into walls 2-3 times in the past week usually with a doorway towards the right and the wall on the left. She has also been having some intermittent left-sided weakness that has been coming and going. This morning she woke up particularly dizzy and off balance. EMS was activated around 1 PM but she refused transport initially. Family then convinced her to come to the hospital for further evaluation and therefore she presented around 3:30 PM. Initially she was neuro intact but then while in triage was noted to have developed a right-MCA syndrome for which code stroke was activated given she had been initially grossly nonfocal on triage exam.  LKW:1-2 weeks tpa given?: No,due to out of the window IR Thrombectomy? No,due to out of the window Modified Rankin Scale: 0-Completely asymptomatic and back to baseline post- stroke Time of teleneurologist evaluation:4:05 PM"  She was transferred to [facility] for further stroke evaluation and for close neuro monitoring with low threshold for stroke activation with further neurologic deficits. She does endorse headaches recently she feels are coming from her right ear that radiate to her right forehead and face. She states her last headache was yesterday 5/5 and denies current headache.    INTERVAL HISTORY patient appears to be doing well at  this time status post thrombectomy.    She is resting well.  Blood pressures are fine. Neurological exam is stable.  No changes   OBJECTIVE Vitals:   09/15/20 0500 09/15/20 0600 09/15/20 0700 09/15/20 0800  BP: (!) 159/59 (!) 143/53 (!) 125/47 116/72  Pulse: (!) 49 (!) 47 62 64  Resp: 14 14 15 16   Temp:    98.4 F (36.9 C)  TempSrc:    Oral  SpO2: 97% 97% 97% 97%  Weight:      Height:        CBC:  Recent Labs  Lab 09/10/20 1540 09/10/20 1559 09/14/20 0407 09/15/20 0410  WBC 4.4   < > 6.0 5.7  NEUTROABS 1.9  --   --   --   HGB 12.2   < > 9.9* 10.3*  HCT 38.3   < > 30.8* 31.5*  MCV 90.5   < > 91.1 88.5  PLT 141*   < > 123* 147*   < > = values in this interval not displayed.    Basic Metabolic Panel:  Recent Labs  Lab 09/13/20 0312 09/14/20 0407 09/15/20 0410  NA 138 138 138  K 2.8* 3.7 3.1*  CL 103 108 105  CO2 27 23 25   GLUCOSE 87 79 88  BUN 10 14 11   CREATININE 0.77 0.71 0.64  CALCIUM 8.8* 8.7* 8.9  MG 1.9  --  1.7    Lipid Panel:     Component Value Date/Time   CHOL 230 (H) 09/12/2020 0025   TRIG 83 09/12/2020 0025  HDL 53 09/12/2020 0025   CHOLHDL 4.3 09/12/2020 0025   VLDL 17 09/12/2020 0025   LDLCALC 160 (H) 09/12/2020 0025   HgbA1c:  Lab Results  Component Value Date   HGBA1C 5.3 09/11/2020   Urine Drug Screen: No results found for: LABOPIA, COCAINSCRNUR, LABBENZ, AMPHETMU, THCU, LABBARB  Alcohol Level     Component Value Date/Time   ETH <10 09/10/2020 1540    IMAGING  CT HEAD CODE STROKE WO CONTRAST 09/11/2020 IMPRESSION:  1. Stable appearance of right parietal acute/subacute infarct.  2. No new infarct.  3. ASPECTS is 10/10.   CT ANGIO HEAD NECK W WO CM W PERF CT CEREBRAL PERFUSION W CONTRAST 09/11/2020 IMPRESSION:  1. 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  2. 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  3. Progressive proximal  right M1 segment stenosis with significant loss of perfusion in the right MCA branch vessels.  4. Stable atherosclerotic changes at the left carotid bifurcation without significant stenosis.  5. 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 Basilar artery is similar the prior exam.  6.  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 No evidence of hemorrhage.   ECHOCARDIOGRAM COMPLETE 09/12/2020 IMPRESSIONS   1. Left ventricular ejection fraction, by estimation, is 60 to 65%. The left ventricle has normal function. The left ventricle has no regional wall motion abnormalities. There is mild left ventricular hypertrophy. Left ventricular diastolic parameters are consistent with Grade I diastolic dysfunction (impaired relaxation).   2. Right ventricular systolic function is normal. The right ventricular size is normal. Tricuspid regurgitation signal is inadequate for assessing PA pressure.   3. Left atrial size was moderately dilated.   4. Right atrial size was mildly dilated.   5. The mitral valve is normal in structure. Trivial mitral valve regurgitation. No evidence of mitral stenosis.   6. The aortic valve is grossly normal. There is mild calcification of the aortic valve. Aortic valve regurgitation is not visualized. No aortic stenosis is present.   7. Aortic dilatation noted. There is borderline dilatation of the ascending aorta, measuring 39 mm.   8. The inferior vena cava is normal in size with greater than 50% respiratory variability, suggesting right atrial pressure of 3 mmHg.  Conclusion(s)/Recommendation(s): No intracardiac source of embolism detected on this transthoracic study. A transesophageal echocardiogram is recommended to exclude cardiac source of embolism if  clinically indicated.    ECG - SB rate 57 BPM. (See cardiology reading for complete details)  PHYSICAL EXAM Blood pressure 116/72, pulse 64, temperature 98.4 F (36.9 C), temperature source Oral, resp. rate 16, height 5\' 10"  (1.778 m), weight 95.3 kg, SpO2 97 %.  GENERAL: She is resting well but responsive.  HEENT: Neck is supple no trauma noted.  EXTREMITIES: No edema   BACK: Normal  SKIN: Normal by inspection.    MENTAL STATUS: She is awake alert and interactive She is oriented to hospital, year, month and medical condition.  She follows commands well.  CRANIAL NERVES: Pupils are equal, round and reactive to light and accomodation; extra ocular movements are full, there is no significant nystagmus; visual fields are full; upper and lower facial muscles are normal in strength and symmetric, there is no flattening of the nasolabial folds;  tongue is midline; uvula is midline; shoulder elevation is normal.  MOTOR:  Right side is graded as 4+/5.  Left upper extremity 4 - and left leg 3.  There is mild drift of the left arm and left leg.  COORDINATION: Left finger to nose is normal, right finger to nose is normal, No rest tremor; no intention tremor; no postural tremor; no bradykinesia.  Gait not tested  ASSESSMENT/PLAN Ms. Maral Lampe is a 75 y.o. female with history of HTN, COPD, aortic aneurysm, DVT in pregnancy, remote smoking history, non-adherence to medications for the past year, and a remote MI who presented to [facility] 5/5 for evaluation of feeling dizzy, recent headaches and off balance for 2 - 3 weeks causing her to run into walls and bump her head.  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 Mechanical thrombectomy performed with combined stent retriever (embotrap) and direct contact aspiration with complete  recanalization (TICI3) 09/11/2020  - Dr. 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 (consider TEE)  Resultant left-sided weakness that is mild.  Code Stroke CT Head - Stable appearance of right parietal acute/subacute infarct. No new infarct. ASPECTS is 10/10.   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 Progressive proximal right M1 segment stenosis with significant loss of perfusion in the right MCA branch vessels. 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 Basilar artery is similar the prior exam.   CT Perfusion - Right MCA territory ischemia with 162 mL of surrounding penumbra.    Carotid Doppler - pending  2D Echo - EF 60 - 65%. No cardiac source of emboli identified.   Sars Corona Virus 2 - negative  LDL - 160  HgbA1c - 5.3  UDS - not ordered  VTE prophylaxis - SCDs Diet  Diet Order            Diet regular Room service appropriate? Yes with Assist; Fluid consistency: Thin  Diet effective now                 No antithrombotic prior to admission, now on aspirin 81 mg daily and clopidogrel 75 mg daily   Patient will be counseled to be compliant with her antithrombotic medications  Ongoing aggressive stroke risk factor management  Therapy recommendations:  CIR recommended - Rehabilitation MD consult ordered  Disposition:  Pending  Hypertension  Home BP meds: none   Current BP meds: Zestril ; Cleviprex and hydralazine prn  Stable  SBP 160 mmHg. Marland Kitchen Long-term BP goal normotensive  Hyperlipidemia  Home Lipid lowering medication: none   LDL 160, goal < 70  Current lipid lowering medication:  Lipitor 80 mg daily   Continue statin at discharge  Other Stroke Risk Factors  Advanced age  Former cigarette smoker - quit  Obesity, Body mass index is 30.13 kg/m., recommend weight loss, diet and exercise as appropriate   Coronary artery disease  Medical non compliance  Other Active Problems, Findings, Recommendations and/or Plan  Code status -  Full code  Aortic Atherosclerosis (ICD10-I70.0)  Bradycardia - 30's - 40's   Mariene Dickerman is an 75 y.o. female.   ASSESSMENT/PLAN: 1.  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  Status post thrombectomy at both the ICA and MCA levels with good recanalization.  Risk factors hypertension and age.  Repeat imaging as per protocol.   Dual antiplatelet agents are recommended for 3 months.  A statin is also recommended along with blood pressure control.  Continue mobilization out of bed.  Ongoing therapy consults.  Transfer to neurology floor bed later today if available.  Plan dual antiplatelet therapy aspirin 81 mg and Brilinta 90 mg twice daily for 3 months followed by aspirin alone given angioplasty and stenting of the right carotid bifurcation need outpatient loop recorder to look for paroxysmal A. fib at discharge.  This patient is critically ill due to stroke post procedure and at significant risk of neurological worsening, death form severe anemia, bleeding, recurrent stroke, intracranial stenosis. This patient's care requires constant monitoring of vital signs, hemodynamics, respiratory and cardiac monitoring, review of multiple databases, neurological assessment, other specialists and medical decision making of high complexity. I spent74minutes of neurocritical care time in the care of this patient Delia Heady, MD Hospital day # 5    To contact Stroke Continuity provider, please refer to [email or web address]. After hours, contact General Neurology

## 2020-09-15 NOTE — Progress Notes (Signed)
STROKE TEAM PROGRESS NOTE       INTERVAL HISTORY Patient complained of right hand pain and weakness this morning.  She had stat CT scan of the head obtained which shows right sylvian fissure and convexity small subarachnoid hemorrhage.  Patient denies any headache or any increased left-sided weakness at the present time.  She is alert and arousable and follows commands.  Neurological exam appears unchanged with only subtle left-sided weakness.  OBJECTIVE Vitals:   09/15/20 1100 09/15/20 1200 09/15/20 1230 09/15/20 1300  BP: (!) 135/54 (!) 146/61 (!) 150/57 132/68  Pulse: (!) 52 (!) 54 (!) 51 80  Resp: 13 18 17  (!) 21  Temp:  97.7 F (36.5 C)    TempSrc:  Oral    SpO2: 97% 98% 98% 98%  Weight:      Height:        CBC:  Recent Labs  Lab 09/10/20 1540 09/10/20 1559 09/14/20 0407 09/15/20 0410  WBC 4.4   < > 6.0 5.7  NEUTROABS 1.9  --   --   --   HGB 12.2   < > 9.9* 10.3*  HCT 38.3   < > 30.8* 31.5*  MCV 90.5   < > 91.1 88.5  PLT 141*   < > 123* 147*   < > = values in this interval not displayed.    Basic Metabolic Panel:  Recent Labs  Lab 09/13/20 0312 09/14/20 0407 09/15/20 0410  NA 138 138 138  K 2.8* 3.7 3.1*  CL 103 108 105  CO2 27 23 25   GLUCOSE 87 79 88  BUN 10 14 11   CREATININE 0.77 0.71 0.64  CALCIUM 8.8* 8.7* 8.9  MG 1.9  --  1.7    Lipid Panel:     Component Value Date/Time   CHOL 230 (H) 09/12/2020 0025   TRIG 83 09/12/2020 0025   HDL 53 09/12/2020 0025   CHOLHDL 4.3 09/12/2020 0025   VLDL 17 09/12/2020 0025   LDLCALC 160 (H) 09/12/2020 0025   HgbA1c:  Lab Results  Component Value Date   HGBA1C 5.3 09/11/2020   Urine Drug Screen: No results found for: LABOPIA, COCAINSCRNUR, LABBENZ, AMPHETMU, THCU, LABBARB  Alcohol Level     Component Value Date/Time   ETH <10 09/10/2020 1540    IMAGING  CT HEAD CODE STROKE WO CONTRAST 09/11/2020 IMPRESSION:  1. Stable appearance of right parietal acute/subacute infarct.  2. No new infarct.  3.  ASPECTS is 10/10.   CT ANGIO HEAD NECK W WO CM W PERF CT CEREBRAL PERFUSION W CONTRAST 09/11/2020 IMPRESSION:  1. 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  2. 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  3. Progressive proximal right M1 segment stenosis with significant loss of perfusion in the right MCA branch vessels.  4. Stable atherosclerotic changes at the left carotid bifurcation without significant stenosis.  5. 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 Basilar artery is similar the prior exam.  6.  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 No evidence of hemorrhage.   ECHOCARDIOGRAM COMPLETE 09/12/2020 IMPRESSIONS   1. Left ventricular ejection fraction, by estimation, is 60 to 65%. The left ventricle has normal function. The left ventricle has no regional wall motion abnormalities.  There is mild left ventricular hypertrophy. Left ventricular diastolic parameters are consistent with Grade I diastolic dysfunction (impaired relaxation).   2. Right ventricular systolic function is normal. The right ventricular size is normal. Tricuspid regurgitation signal is inadequate for assessing PA pressure.   3. Left atrial size was moderately dilated.   4. Right atrial size was mildly dilated.   5. The mitral valve is normal in structure. Trivial mitral valve regurgitation. No evidence of mitral stenosis.   6. The aortic valve is grossly normal. There is mild calcification of the aortic valve. Aortic valve regurgitation is not visualized. No aortic stenosis is present.   7. Aortic dilatation noted. There is borderline dilatation of the ascending aorta, measuring 39 mm.   8.  The inferior vena cava is normal in size with greater than 50% respiratory variability, suggesting right atrial pressure of 3 mmHg.  Conclusion(s)/Recommendation(s): No intracardiac source of embolism detected on this transthoracic study. A transesophageal echocardiogram is recommended to exclude cardiac source of embolism if clinically indicated.    ECG - SB rate 57 BPM. (See cardiology reading for complete details)  PHYSICAL EXAM Blood pressure 132/68, pulse 80, temperature 97.7 F (36.5 C), temperature source Oral, resp. rate (!) 21, height 5\' 10"  (1.778 m), weight 95.3 kg, SpO2 98 %.  GENERAL: She is resting well but responsive.  HEENT: Neck is supple no trauma noted.  EXTREMITIES: No edema   BACK: Normal  SKIN: Normal by inspection.    MENTAL STATUS: She is awake alert and interactive She is oriented to hospital, year, month and medical condition.  She follows commands well.  CRANIAL NERVES: Pupils are equal, round and reactive to light and accomodation; extra ocular movements are full, there is no significant nystagmus; visual fields are full; upper and lower facial muscles are normal in strength and symmetric, there is no flattening of the nasolabial folds; tongue is midline; uvula is midline; shoulder elevation is normal.  MOTOR:  Right side strength is 4+/5.  Left upper extremity 4 - and left leg 3.  Diminished fine finger movements on the left.  Left grip weakness.  Orbits right over left upper extremity.  There is mild drift of the left arm and left leg.  COORDINATION: Left finger to nose is normal, right finger to nose is normal, No rest tremor; no intention tremor; no postural tremor; no bradykinesia.  Gait not tested  ASSESSMENT/PLAN [name] is a 75 y.o. female with history of HTN, COPD, aortic aneurysm, DVT in pregnancy, remote smoking history, non-adherence to medications for the past year, and a remote MI who presented to [facility] 5/5 for evaluation of  feeling dizzy, recent headaches and off balance for 2 - 3 weeks causing her to run into walls and bump her head.  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 Mechanical thrombectomy performed with combined stent retriever (embotrap) and direct contact aspiration with complete recanalization (TICI3) 09/11/2020  - Dr. 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 (consider TEE)  Resultant left-sided weakness that is mild.  Code Stroke CT Head - Stable appearance of right parietal acute/subacute infarct. No new infarct. ASPECTS is 10/10.   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 Progressive proximal right M1 segment stenosis with significant loss of perfusion in the right MCA branch vessels. 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 Basilar artery is similar the prior exam.   CT Perfusion - Right MCA territory ischemia with 162 mL of surrounding penumbra.    Repeat CT head 09/15/2020: Interval development of scattered subarachnoid hemorrhage involving the right sylvian fissure and right frontal cortical sulci trace intraventricular hemorrhage.  Acute to subacute right MCA distribution infarct without midline shift or mass-effect.  Carotid Doppler -not necessary   2D Echo - EF 60 - 65%. No cardiac source of emboli identified.   Sars Corona Virus 2 - negative  LDL - 160  HgbA1c - 5.3  UDS - not ordered  VTE prophylaxis - SCDs Diet  Diet Order            Diet regular  Room service appropriate? Yes with Assist; Fluid consistency: Thin  Diet effective now                 No antithrombotic prior to admission, now on aspirin 81 mg daily and clopidogrel 75 mg daily held on 09/15/2020 due to new subarachnoid hemorrhage  Patient will be counseled to be compliant with her antithrombotic medications  Ongoing aggressive stroke risk factor management  Therapy recommendations:  CIR recommended - Rehabilitation MD consult ordered  Disposition:  Pending  Hypertension  Home BP meds: none   Current BP meds: Zestril ; Cleviprex and hydralazine prn  Stable  SBP 160 mmHg. Marland Kitchen. Long-term BP goal normotensive  Hyperlipidemia  Home Lipid lowering medication: none   LDL 160, goal < 70  Current lipid lowering medication: Lipitor 80 mg daily   Continue statin at discharge  Other Stroke Risk Factors  Advanced age  Former cigarette smoker - quit  Obesity, Body mass index is 30.13 kg/m., recommend weight loss, diet and exercise as appropriate   Coronary artery disease  Medical non compliance  Other Active Problems, Findings, Recommendations and/or Plan  Code status - Full code  Aortic Atherosclerosis (ICD10-I70.0)  Bradycardia - 30's - 40's   [name] is an 75 y.o. female.   ASSESSMENT/PLAN: 1.  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  Status post thrombectomy at both the ICA and MCA levels with good recanalization.  Risk factors hypertension and age.  Repeat imaging as per protocol.   Dual antiplatelet agents are recommended for 3 months.  A statin is also recommended along with blood pressure control.  Repeat CT scan this morning shows asymptomatic right sylvian fissure and frontal convexity subarachnoid hemorrhage.  Plan to hold antiplatelet therapy repeat CT scan of the head without contrast tomorrow.  Keep tight blood pressure control with systolic  goal below 140 for 24 hours and then below 160 continue mobilization out of bed.  Ongoing therapy consults.  Transfer to neurology floor bed later today if available.  .Patient will likely need need outpatient loop recorder to look for paroxysmal A. fib at discharge.  Discussed with Dr. Ouida Sills. Macedo Rodriguez and patient daughter over the phone.  This patient is critically ill due to stroke post procedure and at significant risk of neurological worsening, death form severe anemia, bleeding, recurrent stroke,  intracranial stenosis. This patient's care requires constant monitoring of vital signs, hemodynamics, respiratory and cardiac monitoring, review of multiple databases, neurological assessment, other specialists and medical decision making of high complexity. I spent50minutes of neurocritical care time in the care of this patient Delia Heady, MD Hospital day # 5    To contact Stroke Continuity provider, please refer to [email or web address]. After hours, contact General Neurology

## 2020-09-16 ENCOUNTER — Inpatient Hospital Stay (HOSPITAL_COMMUNITY): Payer: Medicare Other

## 2020-09-16 ENCOUNTER — Inpatient Hospital Stay (HOSPITAL_COMMUNITY)
Admission: RE | Admit: 2020-09-16 | Discharge: 2020-09-28 | DRG: 057 | Disposition: A | Discharge status: Home Health | Payer: Medicare Other | Source: Intra-hospital | Attending: Physical Medicine & Rehabilitation | Admitting: Physical Medicine & Rehabilitation

## 2020-09-16 DIAGNOSIS — I719 Aortic aneurysm of unspecified site, without rupture: Secondary | ICD-10-CM | POA: Diagnosis present

## 2020-09-16 DIAGNOSIS — J449 Chronic obstructive pulmonary disease, unspecified: Secondary | ICD-10-CM

## 2020-09-16 DIAGNOSIS — Z7982 Long term (current) use of aspirin: Secondary | ICD-10-CM | POA: Diagnosis not present

## 2020-09-16 DIAGNOSIS — I610 Nontraumatic intracerebral hemorrhage in hemisphere, subcortical: Secondary | ICD-10-CM | POA: Diagnosis not present

## 2020-09-16 DIAGNOSIS — Z87891 Personal history of nicotine dependence: Secondary | ICD-10-CM

## 2020-09-16 DIAGNOSIS — I10 Essential (primary) hypertension: Secondary | ICD-10-CM

## 2020-09-16 DIAGNOSIS — I63511 Cerebral infarction due to unspecified occlusion or stenosis of right middle cerebral artery: Secondary | ICD-10-CM | POA: Diagnosis not present

## 2020-09-16 DIAGNOSIS — D62 Acute posthemorrhagic anemia: Secondary | ICD-10-CM

## 2020-09-16 DIAGNOSIS — Z8673 Personal history of transient ischemic attack (TIA), and cerebral infarction without residual deficits: Secondary | ICD-10-CM | POA: Diagnosis present

## 2020-09-16 DIAGNOSIS — I615 Nontraumatic intracerebral hemorrhage, intraventricular: Secondary | ICD-10-CM

## 2020-09-16 DIAGNOSIS — I639 Cerebral infarction, unspecified: Secondary | ICD-10-CM | POA: Diagnosis present

## 2020-09-16 DIAGNOSIS — Z79899 Other long term (current) drug therapy: Secondary | ICD-10-CM

## 2020-09-16 DIAGNOSIS — Z9119 Patient's noncompliance with other medical treatment and regimen: Secondary | ICD-10-CM

## 2020-09-16 DIAGNOSIS — I69354 Hemiplegia and hemiparesis following cerebral infarction affecting left non-dominant side: Principal | ICD-10-CM

## 2020-09-16 DIAGNOSIS — E785 Hyperlipidemia, unspecified: Secondary | ICD-10-CM | POA: Diagnosis present

## 2020-09-16 DIAGNOSIS — Z86718 Personal history of other venous thrombosis and embolism: Secondary | ICD-10-CM | POA: Diagnosis not present

## 2020-09-16 DIAGNOSIS — I609 Nontraumatic subarachnoid hemorrhage, unspecified: Secondary | ICD-10-CM

## 2020-09-16 DIAGNOSIS — I6300 Cerebral infarction due to thrombosis of unspecified precerebral artery: Secondary | ICD-10-CM

## 2020-09-16 DIAGNOSIS — I63519 Cerebral infarction due to unspecified occlusion or stenosis of unspecified middle cerebral artery: Secondary | ICD-10-CM

## 2020-09-16 DIAGNOSIS — M25511 Pain in right shoulder: Secondary | ICD-10-CM

## 2020-09-16 DIAGNOSIS — Z8709 Personal history of other diseases of the respiratory system: Secondary | ICD-10-CM

## 2020-09-16 DIAGNOSIS — K5909 Other constipation: Secondary | ICD-10-CM | POA: Diagnosis present

## 2020-09-16 HISTORY — DX: Cerebral infarction due to unspecified occlusion or stenosis of unspecified middle cerebral artery: I63.519

## 2020-09-16 LAB — BASIC METABOLIC PANEL
Anion gap: 8 (ref 5–15)
BUN: 7 mg/dL — ABNORMAL LOW (ref 8–23)
CO2: 23 mmol/L (ref 22–32)
Calcium: 8.8 mg/dL — ABNORMAL LOW (ref 8.9–10.3)
Chloride: 105 mmol/L (ref 98–111)
Creatinine, Ser: 0.84 mg/dL (ref 0.44–1.00)
GFR, Estimated: >60 mL/min (ref >60)
Glucose, Bld: 153 mg/dL — ABNORMAL HIGH (ref 70–99)
Potassium: 3.4 mmol/L — ABNORMAL LOW (ref 3.5–5.1)
Sodium: 136 mmol/L (ref 135–145)

## 2020-09-16 LAB — PLATELET INHIBITION P2Y12: Platelet Function  P2Y12: 177 [PRU] — ABNORMAL LOW (ref 182–335)

## 2020-09-16 LAB — MAGNESIUM: Magnesium: 1.9 mg/dL (ref 1.7–2.4)

## 2020-09-16 MED ORDER — BLOOD PRESSURE CONTROL BOOK
Freq: Once | Status: AC
  Filled 2020-09-16: qty 1

## 2020-09-16 MED ORDER — BENZONATATE 100 MG PO CAPS: 100.0000 mg | ORAL_CAPSULE | Freq: Three times a day (TID) | ORAL | Status: DC | PRN

## 2020-09-16 MED ORDER — TICAGRELOR 60 MG PO TABS
60.0000 mg | ORAL_TABLET | Freq: Two times a day (BID) | ORAL | Status: DC
  Administered 2020-09-16: 60 mg via ORAL
  Filled 2020-09-16 (×2): qty 1

## 2020-09-16 MED ORDER — ACETAMINOPHEN 325 MG PO TABS
650.0000 mg | ORAL_TABLET | ORAL | Status: DC | PRN
  Administered 2020-09-17 – 2020-09-28 (×12): 650 mg via ORAL
  Filled 2020-09-16 (×12): qty 2

## 2020-09-16 MED ORDER — ATORVASTATIN CALCIUM 80 MG PO TABS
80.0000 mg | ORAL_TABLET | Freq: Every day | ORAL | Status: DC
  Administered 2020-09-16 – 2020-09-27 (×12): 80 mg via ORAL
  Filled 2020-09-16 (×12): qty 1

## 2020-09-16 MED ORDER — ACETAMINOPHEN 160 MG/5ML PO SOLN: 650.0000 mg | ORAL | Status: DC | PRN

## 2020-09-16 MED ORDER — ATORVASTATIN CALCIUM 80 MG PO TABS
80.0000 mg | ORAL_TABLET | Freq: Every day | ORAL | 0 refills | Status: DC
Start: 2020-09-16 — End: 2020-09-27

## 2020-09-16 MED ORDER — AMLODIPINE BESYLATE 2.5 MG PO TABS
2.5000 mg | ORAL_TABLET | Freq: Every day | ORAL | Status: DC
  Administered 2020-09-17: 2.5 mg via ORAL
  Filled 2020-09-16: qty 1

## 2020-09-16 MED ORDER — ASPIRIN EC 81 MG PO TBEC
81.0000 mg | DELAYED_RELEASE_TABLET | Freq: Every day | ORAL | Status: DC
  Administered 2020-09-17 – 2020-09-28 (×12): 81 mg via ORAL
  Filled 2020-09-16 (×13): qty 1

## 2020-09-16 MED ORDER — TICAGRELOR 60 MG PO TABS: 60.0000 mg | ORAL_TABLET | Freq: Two times a day (BID) | ORAL | 0 refills | Status: DC

## 2020-09-16 MED ORDER — ACETAMINOPHEN 650 MG RE SUPP: 650.0000 mg | RECTAL | Status: DC | PRN

## 2020-09-16 MED ORDER — ASPIRIN 81 MG PO TBEC: 81.0000 mg | DELAYED_RELEASE_TABLET | Freq: Every day | ORAL | 0 refills | Status: DC

## 2020-09-16 MED ORDER — SENNOSIDES-DOCUSATE SODIUM 8.6-50 MG PO TABS
1.0000 | ORAL_TABLET | Freq: Every evening | ORAL | Status: DC | PRN
  Administered 2020-09-25: 1 via ORAL
  Filled 2020-09-16: qty 1

## 2020-09-16 MED ORDER — ASPIRIN EC 81 MG PO TBEC
81.0000 mg | DELAYED_RELEASE_TABLET | Freq: Every day | ORAL | Status: DC
  Administered 2020-09-16: 81 mg via ORAL
  Filled 2020-09-16: qty 1

## 2020-09-16 MED ORDER — MAGNESIUM SULFATE IN D5W 1-5 GM/100ML-% IV SOLN
1.0000 g | Freq: Once | INTRAVENOUS | Status: AC
  Administered 2020-09-16: 1 g via INTRAVENOUS
  Filled 2020-09-16: qty 100

## 2020-09-16 MED ORDER — POTASSIUM CHLORIDE CRYS ER 20 MEQ PO TBCR
40.0000 meq | EXTENDED_RELEASE_TABLET | Freq: Once | ORAL | Status: AC
  Administered 2020-09-16: 40 meq via ORAL
  Filled 2020-09-16: qty 2

## 2020-09-16 MED ORDER — TICAGRELOR 60 MG PO TABS
60.0000 mg | ORAL_TABLET | Freq: Two times a day (BID) | ORAL | Status: DC
  Administered 2020-09-16 – 2020-09-17 (×2): 60 mg via ORAL
  Filled 2020-09-16 (×3): qty 1

## 2020-09-16 MED ORDER — NEOMYCIN-POLYMYXIN-HC 3.5-10000-1 OT SUSP
3.0000 [drp] | Freq: Four times a day (QID) | OTIC | Status: DC
  Administered 2020-09-17 – 2020-09-24 (×29): 3 [drp] via OTIC
  Filled 2020-09-16 (×2): qty 10

## 2020-09-16 MED ORDER — AMLODIPINE BESYLATE 2.5 MG PO TABS: 2.5000 mg | ORAL_TABLET | Freq: Every day | ORAL | 0 refills | Status: DC

## 2020-09-16 MED ORDER — EXERCISE FOR HEART AND HEALTH BOOK
Freq: Once | Status: AC
  Filled 2020-09-16: qty 1

## 2020-09-16 NOTE — H&P (Signed)
Physical Medicine and Rehabilitation Admission H&P    Chief Complaint  Patient presents with  . Code Stroke  : HPI: [name] is a 75 year old right-handed female with history of COPD, DVT during pregnancy hypertension, thoracicoabdominal aortic aneurysm, quit smoking 33 years ago, question medical noncompliance.  History taken from chart review and patient. Patient lives with her family.  Independent prior to admission.  Two-level home bed and bath upstairs.  Independent ADLs.  She does not drive.  Presented on 09/10/2020 with dizziness and unsteady gait that persisted over 2-3 weeks as well as intermittent left-sided weakness.  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  No evidence of hemorrhage.  CT angiogram of head and neck age-indeterminate occlusion of the right cervical ICA just beyond its origin.  At least partial reconstitution at the clinoid intracranially.  Less than 50% stenosis of the left ICA origin.  Admission chemistries unremarkable except potassium 3.3. Patient underwent right ICA mechanical thrombectomy revascularization per interventional radiology 09/12/2020.  Currently maintained on DAPT aspirin 81 mg daily and Brilinta for CVA prophylaxis x3 months.  Cleviprex initially added for blood pressure control.  Patient developed increasing right hand pain and numbness follow-up CT of the head 09/15/2020 showed interval development of scattered SAH involving the right sylvian fissure and overlying right frontal cortical sulci.  Small volume intraventricular hemorrhage seen as well likely related to redistribution.  No hydrocephalus or ventricular trapping.  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  Due to patient's left-sided  hemiparesis decreased functional mobility was admitted for a comprehensive rehab program. Please see preadmission assessment from earlier today as well.   Review of Systems  Constitutional: Negative for chills, fever and malaise/fatigue.  HENT: Negative for hearing loss.   Eyes: Negative for blurred vision and double vision.  Respiratory: Negative for cough.        Occasional shortness of breath with exertion  Cardiovascular: Negative for chest pain, palpitations and leg swelling.  Gastrointestinal: Positive for constipation. Negative for heartburn, nausea and vomiting.  Genitourinary: Negative for dysuria, flank pain and hematuria.  Musculoskeletal: Positive for joint pain and myalgias.  Skin: Negative for rash.  Neurological: Positive for dizziness and headaches. Negative for weakness.  All other systems reviewed and are negative.  Past Medical History:  Diagnosis Date  . COPD (chronic obstructive pulmonary disease) (HCC)   . History of DVT (deep vein thrombosis)   . Hypertension   . Thoracoabdominal aortic aneurysm [facility])    Past Surgical History:  Procedure Laterality Date  . IR CT HEAD LTD  09/11/2020  . IR INTRAVSC STENT CERV CAROTID W/O EMB-PROT MOD SED INC ANGIO  09/11/2020  . IR PERCUTANEOUS ART THROMBECTOMY/INFUSION INTRACRANIAL INC DIAG ANGIO  09/11/2020      . IR PERCUTANEOUS ART THROMBECTOMY/INFUSION INTRACRANIAL INC DIAG ANGIO  09/11/2020  . IR US GUIDE VASC ACCESS RIGHT  09/11/2020  . NO PAST SURGERIES    . RADIOLOGY WITH ANESTHESIA N/A 09/11/2020   Procedure: IR WITH ANESTHESIA;  Surgeon: Julieanne Cotton, MD;  Location: [facility] OR;  Service: Radiology;  Laterality: N/A;   No pertinent family history of premature CVA. Social History:  reports that she quit smoking about 33 years ago. She has never used smokeless tobacco. She reports that she does not drink alcohol and  does not use drugs. Allergies: No Known Allergies Medications Prior to Admission  Medication Sig Dispense  Refill  . ibuprofen (ADVIL) 200 MG tablet Take 200 mg by mouth every 6 (six) hours as needed for headache.      Drug Regimen Review Drug regimen was reviewed and remains appropriate with no significant issues identified  Home: Home Living Family/patient expects to be discharged to:: Private residence Living Arrangements: Children Available Help at Discharge: Family,Available 24 hours/day Type of Home: Apartment Home Access: Stairs to enter Entrance Stairs-Number of Steps: 2 Entrance Stairs-Rails: None Home Layout: Two level,Bed/bath upstairs Alternate Level Stairs-Number of Steps: flight Alternate Level Stairs-Rails: Left Bathroom Shower/Tub: Engineer, manufacturing systems: Standard Home Equipment: None  Lives With: Daughter   Functional History: Prior Function Level of Independence: Independent Comments: Independent with ADLs/IADLs. Does not drive at baseline.  Functional Status:  Mobility: Bed Mobility Overal bed mobility: Needs Assistance Bed Mobility: Supine to Sit Supine to sit: Min assist General bed mobility comments: MinA for trunk assist to upright Transfers Overall transfer level: Needs assistance Equipment used: Rolling walker (2 wheeled) Transfers: Sit to/from Stand Sit to Stand: Mod assist,Min assist  Lateral/Scoot Transfers: Min guard General transfer comment: Pt requiring modA to rise from edge of bed, minA to stand from toilet height Ambulation/Gait Ambulation/Gait assistance: Min assist Gait Distance (Feet): 120 Feet Assistive device: Rolling walker (2 wheeled) Gait Pattern/deviations: Decreased stride length,Decreased dorsiflexion - left,Shuffle,Drifts right/left,Trunk flexed,Step-through pattern General Gait Details: Cues for increased left foot clearance, neutral positioning, walker proximity, maintaining a straight path. Pt requiring minA for balance and walker negotiation, tends to run into objects on left. Tendency for downward gaze Gait  velocity: 0.3 m/s Gait velocity interpretation: <1.31 ft/sec, indicative of household ambulator    ADL: ADL Overall ADL's : Needs assistance/impaired Eating/Feeding: Set up,Sitting Grooming: Min guard,Standing,Wash/dry hands,Wash/dry face Upper Body Bathing: Minimal assistance,Sitting Lower Body Bathing: Maximal assistance,+2 for physical assistance,+2 for safety/equipment,Cueing for safety,Cueing for sequencing,Sitting/lateral leans,Sit to/from stand Upper Body Dressing : Minimal assistance,Sitting Lower Body Dressing: Maximal assistance,+2 for physical assistance,+2 for safety/equipment,Cueing for safety Lower Body Dressing Details (indicate cue type and reason): assist for socks Toilet Transfer: Minimal assistance,+2 for physical assistance,+2 for safety/equipment,Ambulation,Grab bars,RW Toilet Transfer Details (indicate cue type and reason): cues for L side peripheral field deficits Toileting- Clothing Manipulation and Hygiene: Minimal assistance,+2 for physical assistance,+2 for safety/equipment,Sitting/lateral lean,Sit to/from stand Toileting - Clothing Manipulation Details (indicate cue type and reason): Pt standing with RUE holding onto grab bar and LUE performing pericare. Functional mobility during ADLs: Minimal assistance,+2 for physical assistance,+2 for safety/equipment,Cueing for safety,Cueing for sequencing,Rolling walker,Moderate assistance (ModA iniitally for stability and minA +2 thereafter) General ADL Comments: Patient limited by L-sided weakness, L peripheral visual deficits and decreased awareness of deficits with maximal cues to look to L.  Cognition: Cognition Overall Cognitive Status: Impaired/Different from baseline Arousal/Alertness: Awake/alert Orientation Level: Oriented X4 Attention: Sustained Sustained Attention: Appears intact Memory: Impaired Memory Impairment: Retrieval deficit Awareness: Impaired Awareness Impairment: Anticipatory impairment Problem  Solving: Impaired Problem Solving Impairment: Verbal complex Safety/Judgment: Other (comment) (functional for basic safety) Cognition Arousal/Alertness: Awake/alert Behavior During Therapy: Flat affect Overall Cognitive Status: Impaired/Different from baseline Area of Impairment: Attention,Safety/judgement,Awareness,Problem solving Current Attention Level: Sustained Memory: Decreased short-term memory,Decreased recall of precautions Following Commands: Follows one step commands with increased time Safety/Judgement: Decreased awareness of deficits,Decreased awareness of safety Awareness: Emergent Problem Solving: Requires verbal cues,Requires tactile cues,Slow processing,Decreased initiation General Comments: Pt received asleep, minimal interaction with therapist throughout session, following 1  step commands consistently. Decreased left sided awareness  Physical Exam: Blood pressure (!) 122/98, pulse (!) 56, temperature 98.2 F (36.8 C), temperature source Axillary, resp. rate 16, height 5\' 10"  (1.778 m), weight 95.3 kg, SpO2 96 %. Physical Exam Vitals reviewed.  Constitutional:      General: She is not in acute distress.    Appearance: She is obese.  HENT:     Head: Normocephalic and atraumatic.     Comments: Poor dentition    Right Ear: External ear normal.     Left Ear: External ear normal.     Nose: Nose normal.  Eyes:     General:        Right eye: No discharge.        Left eye: No discharge.     Extraocular Movements: Extraocular movements intact.  Cardiovascular:     Rate and Rhythm: Normal rate and regular rhythm.  Pulmonary:     Effort: Pulmonary effort is normal. No respiratory distress.  Abdominal:     General: Abdomen is flat. Bowel sounds are normal. There is no distension.  Musculoskeletal:     Cervical back: Normal range of motion and neck supple.     Comments: No edema or tenderness in extremities  Skin:    General: Skin is warm and dry.  Neurological:      Mental Status: She is alert and oriented to person, place, and time.     Comments: Alert Makes eye contact with examiner.   Follows commands.   Senstion intact to light touch Motor: RUE/RLE: 5/5 proximal to distal LUE/LLE: 4-4+/5 proximal to distal  Psychiatric:        Speech: Speech is delayed.        Behavior: Behavior is slowed.     Results for orders placed or performed during the hospital encounter of 09/10/20 (from the past 48 hour(s))  Comprehensive metabolic panel     Status: Abnormal   Collection Time: 09/15/20  4:10 AM  Result Value Ref Range   Sodium 138 135 - 145 mmol/L   Potassium 3.1 (L) 3.5 - 5.1 mmol/L   Chloride 105 98 - 111 mmol/L   CO2 25 22 - 32 mmol/L   Glucose, Bld 88 70 - 99 mg/dL    Comment: Glucose reference range applies only to samples taken after fasting for at least 8 hours.   BUN 11 8 - 23 mg/dL   Creatinine, Ser 11/15/20 0.44 - 1.00 mg/dL   Calcium 8.9 8.9 - 8.13 mg/dL   Total Protein 6.2 (L) 6.5 - 8.1 g/dL   Albumin 3.0 (L) 3.5 - 5.0 g/dL   AST 14 (L) 15 - 41 U/L   ALT 9 0 - 44 U/L   Alkaline Phosphatase 62 38 - 126 U/L   Total Bilirubin 0.6 0.3 - 1.2 mg/dL   GFR, Estimated 88.7 >19 mL/min    Comment: (NOTE) Calculated using the CKD-EPI Creatinine Equation (2021)    Anion gap 8 5 - 15    Comment: Performed at [facility], [address] [place], [place]  Magnesium     Status: None   Collection Time: 09/15/20  4:10 AM  Result Value Ref Range   Magnesium 1.7 1.7 - 2.4 mg/dL    Comment: Performed at [facility], [address], [place], [place]  CBC     Status: Abnormal   Collection Time: 09/15/20  4:10 AM  Result Value Ref Range  WBC 5.7 4.0 - 10.5 K/uL   RBC 3.56 (L) 3.87 - 5.11 MIL/uL   Hemoglobin 10.3 (L) 12.0 - 15.0 g/dL   HCT 38.7 (L) 56.4 - 33.2 %   MCV 88.5 80.0 - 100.0 fL   MCH 28.9 26.0 - 34.0 pg   MCHC 32.7 30.0 - 36.0 g/dL   RDW 95.1 88.4 - 16.6 %   Platelets 147 (L) 150 - 400 K/uL   nRBC  0.0 0.0 - 0.2 %    Comment: Performed at [facility], [address], [place], [place] [address]   CT HEAD WO CONTRAST  Result Date: 09/15/2020 CLINICAL DATA:  Initial evaluation for new onset right arm weakness, history of recent stroke. EXAM: CT HEAD WITHOUT CONTRAST TECHNIQUE: Contiguous axial images were obtained from the base of the skull through the vertex without intravenous contrast. COMPARISON:  Prior studies from 09/11/2020. FINDINGS: Brain: Patchy hypodensities involving the right basal ganglia and posterior right parieto-occipital region, consistent with evolving acute to subacute right MCA distribution infarcts. Since previous exam, there has been interval development of scattered subarachnoid hemorrhage involving the right sylvian fissure and overlying right frontal cortical sulci. Small volume intraventricular hemorrhage seen as well, with blood seen layering within the occipital horns of both lateral ventricles, likely related to redistribution. No hydrocephalus or ventricular trapping. No significant mass effect or midline shift. Basilar cisterns remain patent. No other definite acute large vessel territory infarct. No mass lesion or extra-axial fluid collection. Underlying atrophy with chronic microvascular ischemic disease again noted. Vascular: No visible hyperdense vessel. Skull: Scalp soft tissues and calvarium within normal limits. Sinuses/Orbits: Globes and orbital soft tissues within normal limits. Moderate mucosal thickening in opacity seen throughout the paranasal sinuses, with air-fluid levels present within both maxillary sinuses. Small right mastoid effusion noted. Other: None. IMPRESSION: 1. Interval development of scattered subarachnoid hemorrhage involving the right Sylvian fissure and overlying right frontal cortical sulci. Small volume intraventricular hemorrhage seen as well, likely related to redistribution. No hydrocephalus or ventricular trapping. 2. Evolving acute  to subacute right MCA distribution infarcts. No significant mass effect or midline shift. 3. No other new acute intracranial abnormality. 4. Underlying atrophy with chronic microvascular ischemic disease. Critical Value/emergent results were called by telephone at the time of interpretation on 09/15/2020 at 2:18 am to provider [facility] , who verbally acknowledged these results. Electronically Signed   By: Rise Mu M.D.   On: 09/15/2020 02:33   Medical Problem List and Plan: 1.  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  Status post right ICA revascularization  -patient may shower  -ELOS/Goals: 8-12 days/Supervision/Mod I  Admit to CIR 2.  Antithrombotics: -DVT/anticoagulation: SCDs  -antiplatelet therapy: Aspirin 81 mg daily, Brilinta 60 mg twice daily 3. Pain Management: Tylenol as needed 4. Mood: Provide emotional support  -antipsychotic agents: N/A 5. Neuropsych: This patient is capable of making decisions on her own behalf. 6. Skin/Wound Care: Routine skin checks 7. Fluids/Electrolytes/Nutrition: Routine in and outs  CMP ordered for tomorrow 8.  Hypertension.  Norvasc 2.5 mg daily.    Monitor with increased mobility 9.  Hyperlipidemia: Lipitor 10.  History of COPD with remote tobacco use.    O2 Sats every shift  Questionable medical noncompliance.  Provide counseling   Charlton Amor, PA-C 09/16/2020  I have personally performed a face to face diagnostic evaluation, including,  but not limited to relevant history and physical exam findings, of this patient and developed relevant assessment and plan.  Additionally, I have reviewed and concur with the physician assistant's documentation above.  Maryla Morrow, MD, ABPMR  The patient's status has not changed. Any changes from the pre-admission screening or  documentation from the acute chart are noted above.   Maryla Morrow, MD, ABPMR

## 2020-09-16 NOTE — Progress Notes (Signed)
PMR Admission Coordinator Pre-Admission Assessment   Patient: [name] is an 75 y.o., female MRN: [id number] DOB: 1946/02/15 Height: 5\' 10"  (177.8 cm) Weight: 95.3 kg                                                                                                                                                  Insurance Information HMO:     PPO:      PCP:      IPA:      80/20:      OTHER:  PRIMARY: Medicare A and B      Policy#: [id number]      Subscriber: pt CM Name:       Phone#:      Fax#:  Pre-Cert#: verified :  Benefits:  Phone #:      Name:  Eff. Date: Part A 03/09/2009, Part B 07/08/19     Deduct: $1556      Out of Pocket Max: n/a      Life Max: n/a  CIR: 100%      SNF: 20 full days Outpatient: 80%     Co-Pay: 20% Home Health: 100%      Co-Pay:  DME: 80%     Co-Pay: 20% Providers:  SECONDARY: Medicaid      Policy#: 09/07/19      Phone#: n/a   Financial Counselor:       Phone#:    The "Data Collection Information Summary" for patients in Inpatient Rehabilitation Facilities with attached "Privacy Act Statement-Health Care Records" was provided and verbally reviewed with: Family   Emergency Contact Information Contact Information       Name Relation Home Work Mobile    [name] Daughter     [phone]    [phone] [name]   [phone]    [name] Daughter [phone]             Current Medical History  Patient Admitting Diagnosis: CVA    History of Present Illness: [name] is a 75 year old right-handed female with history of COPD, DVT during pregnancy, hypertension, thoracicoabdominal aortic aneurysm, quit smoking 33 years ago, question medical noncompliance.   Presented 09/10/2020 with dizziness and unsteady gait that persisted over 2 to 3 weeks, as well as intermittent left-sided weakness.  CT/MRI showed right parietal small to moderate size acute to subacute infarct as well as few additional punctate acute to  subacute right centrum semiovale infarcts.  No evidence of hemorrhage.  CT angiogram of head and neck age-indeterminate occlusion of the right cervical ICA just beyond its origin.  At least partial reconstitution at the clinoid intracranially.  Less than 50% stenosis of the left ICA origin.  Admission chemistries unremarkable except potassium 3.3.  Patient underwent right ICA mechanical thrombectomy revascularization per interventional radiology 09/12/2020.  Currently maintained on DAPT  aspirin 81 mg daily and Plavix 75 mg daily for CVA prophylaxis x3 months.  Cleviprex initially added for blood pressure control.  Patient developed increasing right hand pain and numbness follow-up CT of the head 09/15/2020 showed interval development of scattered subarachnoid hemorrhage involving the right sylvian fissure and overlying right frontal cortical sulci.  Small volume intraventricular hemorrhage seen as well likely related to redistribution.  No hydrocephalus or ventricular trapping.  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  Tolerating a regular diet.  Due to patient's left-sided weakness decreased functional mobility was recommended for a comprehensive rehab program.   Complete NIHSS TOTAL: 3 Glasgow Coma Scale Score: 15   Past Medical History      Past Medical History:  Diagnosis Date  . COPD (chronic obstructive pulmonary disease) (HCC)    . History of DVT (deep vein thrombosis)    . Hypertension    . Thoracoabdominal aortic aneurysm [facility])        Family History  family history is not on file.   Prior Rehab/Hospitalizations:  Has the patient had prior rehab or hospitalizations prior to admission? No   Has the patient had major surgery during 100 days prior to admission? Yes   Current Medications    Current Facility-Administered Medications:  .  0.9 %  sodium chloride infusion, , Intravenous,  Continuous, Cipriano Bunker, MD, Stopped at 09/11/20 0900 .  0.9 %  sodium chloride infusion, , Intravenous, Continuous, Milon Dikes, MD .  acetaminophen (TYLENOL) tablet 650 mg, 650 mg, Oral, Q4H PRN, 650 mg at 09/14/20 2221 **OR** acetaminophen (TYLENOL) 160 MG/5ML solution 650 mg, 650 mg, Per Tube, Q4H PRN **OR** acetaminophen (TYLENOL) suppository 650 mg, 650 mg, Rectal, Q4H PRN, Cipriano Bunker, MD .  amLODipine (NORVASC) tablet 2.5 mg, 2.5 mg, Oral, Daily, Jerald Kief, MD .  aspirin EC tablet 81 mg, 81 mg, Oral, Daily, Louk, Alexandra M, PA-C .  atorvastatin (LIPITOR) tablet 80 mg, 80 mg, Oral, QHS, Cipriano Bunker, MD, 80 mg at 09/15/20 2214 .  benzonatate (TESSALON) capsule 100 mg, 100 mg, Oral, TID PRN, Jerald Kief, MD .  Chlorhexidine Gluconate Cloth 2 % PADS 6 each, 6 each, Topical, Daily, Jerald Kief, MD, 6 each at 09/16/20 1041 .  clevidipine (CLEVIPREX) infusion 0.5 mg/mL, 0-21 mg/hr, Intravenous, Continuous, de Melchor Amour, Jerilynn Mages, MD, Stopped at 09/16/20 [phone] .  diphenhydrAMINE (BENADRYL) capsule 25 mg, 25 mg, Oral, Q6H PRN, Jerald Kief, MD, 25 mg at 09/11/20 1720 .  hydrALAZINE (APRESOLINE) injection 5-10 mg, 5-10 mg, Intravenous, Q4H PRN, Micki Riley, MD, 10 mg at 09/15/20 1611 .  labetalol (NORMODYNE) injection 20 mg, 20 mg, Intravenous, Q2H PRN, Micki Riley, MD, 20 mg at 09/16/20 0754 .  magnesium sulfate IVPB 1 g 100 mL, 1 g, Intravenous, Once, Pahwani, Ravi, MD .  neomycin-polymyxin-hydrocortisone (CORTISPORIN) OTIC (EAR) suspension 3 drop, 3 drop, Right EAR, Q6H, Jerald Kief, MD, 3 drop at 09/16/20 1122 .  potassium chloride SA (KLOR-CON) CR tablet 40 mEq, 40 mEq, Oral, Once, Pahwani, Ravi, MD .  senna-docusate (Senokot-S) tablet 1 tablet, 1 tablet, Oral, QHS PRN, Cipriano Bunker, MD .  ticagrelor (BRILINTA) tablet 60 mg, 60 mg, Oral, BID, Louk, Alexandra M, PA-C   Patients Current Diet:  Diet Order                  Diet regular Room  service appropriate? Yes with Assist;  Fluid consistency: Thin  Diet effective now                         Precautions / Restrictions Precautions Precautions: Fall Precaution Comments: L inattention Restrictions Weight Bearing Restrictions: No    Has the patient had 2 or more falls or a fall with injury in the past year?Yes   Prior Activity Level Limited Community (1-2x/wk): no DME at baseline, doesn't drive, daughter takes to appointments   Prior Functional Level Prior Function Level of Independence: Independent Comments: Independent with ADLs/IADLs. Does not drive at baseline.   Self Care: Did the patient need help bathing, dressing, using the toilet or eating?  Independent   Indoor Mobility: Did the patient need assistance with walking from room to room (with or without device)? Independent   Stairs: Did the patient need assistance with internal or external stairs (with or without device)? Independent   Functional Cognition: Did the patient need help planning regular tasks such as shopping or remembering to take medications? Independent   Home Assistive Devices / Equipment Home Assistive Devices/Equipment: None Home Equipment: None   Prior Device Use: Indicate devices/aids used by the patient prior to current illness, exacerbation or injury? None of the above   Current Functional Level Cognition   Arousal/Alertness: Awake/alert Overall Cognitive Status: Impaired/Different from baseline Current Attention Level: Sustained Orientation Level: Oriented X4 Following Commands: Follows one step commands with increased time Safety/Judgement: Decreased awareness of deficits,Decreased awareness of safety General Comments: Pt received asleep, minimal interaction with therapist throughout session, following 1 step commands consistently. Decreased left sided awareness Attention: Sustained Sustained Attention: Appears intact Memory: Impaired Memory Impairment: Retrieval  deficit Awareness: Impaired Awareness Impairment: Anticipatory impairment Problem Solving: Impaired Problem Solving Impairment: Verbal complex Safety/Judgment: Other (comment) (functional for basic safety)    Extremity Assessment (includes Sensation/Coordination)   Upper Extremity Assessment: Generalized weakness RUE Deficits / Details: strength 5/5, AROM is WNLs LUE Deficits / Details: AROM WFL with increased time. MMT grossly 4/5. LUE Sensation: WNL LUE Coordination: decreased fine motor,decreased gross motor  Lower Extremity Assessment: Defer to PT evaluation LLE Deficits / Details: grossly 3+/5 with very limited endurance. pt denies difference in sensation     ADLs   Overall ADL's : Needs assistance/impaired Eating/Feeding: Set up,Sitting Grooming: Min guard,Standing,Wash/dry hands,Wash/dry face Upper Body Bathing: Minimal assistance,Sitting Lower Body Bathing: Maximal assistance,+2 for physical assistance,+2 for safety/equipment,Cueing for safety,Cueing for sequencing,Sitting/lateral leans,Sit to/from stand Upper Body Dressing : Minimal assistance,Sitting Lower Body Dressing: Maximal assistance,+2 for physical assistance,+2 for safety/equipment,Cueing for safety Lower Body Dressing Details (indicate cue type and reason): assist for socks Toilet Transfer: Minimal assistance,+2 for physical assistance,+2 for safety/equipment,Ambulation,Grab bars,RW Toilet Transfer Details (indicate cue type and reason): cues for L side peripheral field deficits Toileting- Clothing Manipulation and Hygiene: Minimal assistance,+2 for physical assistance,+2 for safety/equipment,Sitting/lateral lean,Sit to/from stand Toileting - Clothing Manipulation Details (indicate cue type and reason): Pt standing with RUE holding onto grab bar and LUE performing pericare. Functional mobility during ADLs: Minimal assistance,+2 for physical assistance,+2 for safety/equipment,Cueing for safety,Cueing for  sequencing,Rolling walker,Moderate assistance (ModA iniitally for stability and minA +2 thereafter) General ADL Comments: Patient limited by L-sided weakness, L peripheral visual deficits and decreased awareness of deficits with maximal cues to look to L.     Mobility   Overal bed mobility: Needs Assistance Bed Mobility: Supine to Sit Supine to sit: Min assist General bed mobility comments: Cues for sequencing, exiting towards left side of the bed,  minA for trunk to execute upright positioning     Transfers   Overall transfer level: Needs assistance Equipment used: Rolling walker (2 wheeled) Transfers: Sit to/from Stand Sit to Stand: Min assist  Lateral/Scoot Transfers: Min guard General transfer comment: MinA to rise from edge of bed and toilet, cues for hand placement and use of grab bars     Ambulation / Gait / Stairs / Wheelchair Mobility   Ambulation/Gait Ambulation/Gait assistance: Editor, commissioning (Feet): 120 Feet Assistive device: Rolling walker (2 wheeled) Gait Pattern/deviations: Decreased stride length,Decreased dorsiflexion - left,Shuffle,Drifts right/left,Trunk flexed,Step-through pattern General Gait Details: Cues provided for neutral left foot positioning, walker proximity, negotiating obstacles. Pt requires minA for balance and maintaining walker on a straight path. Tendency for left drift and consistently running into several objects on the left. Cues provided for visual scanning, however, pt does not perform. Gait velocity: decreased Gait velocity interpretation: <1.31 ft/sec, indicative of household ambulator     Posture / Balance Balance Overall balance assessment: Needs assistance Sitting-balance support: Feet supported Sitting balance-Leahy Scale: Fair Standing balance support: Bilateral upper extremity supported Standing balance-Leahy Scale: Poor Standing balance comment: reliant on RW High level balance activites: Head turns,Direction changes High  Level Balance Comments: Increased unsteadiness with horizontal/vertical head turns, directional changes.     Special needs/care consideration n/a        Previous Home Environment (from acute therapy documentation) Living Arrangements: Children  Lives With: Daughter Available Help at Discharge: Family,Available 24 hours/day Type of Home: Apartment Home Layout: Two level,Bed/bath upstairs Alternate Level Stairs-Rails: Left Alternate Level Stairs-Number of Steps: flight Home Access: Stairs to enter Entrance Stairs-Rails: None Entrance Stairs-Number of Steps: 2 Bathroom Shower/Tub: Engineer, manufacturing systems: Standard Home Care Services: No   Discharge Living Setting Plans for Discharge Living Setting: Lives with (comment) (daughter) Type of Home at Discharge: House Discharge Home Layout: One level Discharge Home Access: Stairs to enter Entrance Stairs-Rails: None Entrance Stairs-Number of Steps: 2 Discharge Bathroom Shower/Tub: Tub/shower unit Discharge Bathroom Toilet: Standard Discharge Bathroom Accessibility: Yes How Accessible: Accessible via walker Does the patient have any problems obtaining your medications?: No   Social/Family/Support Systems Anticipated Caregiver: daughter, Beryle Beams Anticipated Caregiver's Contact Information: [phone] Ability/Limitations of Caregiver: min assist Caregiver Availability: 24/7 Discharge Plan Discussed with Primary Caregiver: Yes Is Caregiver In Agreement with Plan?: Yes Does Caregiver/Family have Issues with Lodging/Transportation while Pt is in Rehab?: No     Goals Patient/Family Goal for Rehab: PT/OT/SLP Mod I/Supervision  Expected length of stay: 10-14 days Pt/Family Agrees to Admission and willing to participate: Yes Program Orientation Provided & Reviewed with Pt/Caregiver Including Roles  & Responsibilities: Yes     Decrease burden of Care through IP rehab admission: n/a     Possible need for SNF placement  upon discharge: Not anticipated.      Patient Condition: This patient's medical and functional status has changed since the consult dated: 09/14/2020 in which the Rehabilitation Physician determined and documented that the patient's condition is appropriate for intensive rehabilitative care in an inpatient rehabilitation facility. See "History of Present Illness" (above) for medical update. Functional changes are: pt min assist with mobility and ADLs. Patient's medical and functional status update has been discussed with the Rehabilitation physician and patient remains appropriate for inpatient rehabilitation. Will admit to inpatient rehab today.   Preadmission Screen Completed By:  Stephania Fragmin, PT, DPT 09/16/2020 11:36 AM ______________________________________________________________________   Discussed status with Dr. Allena Katz on 09/16/20 at  11:43 AM  and received approval for admission today.   Admission Coordinator:  Stephania Fragminaitlin E Xzavien Harada, PT, DPT 11:43 Othella BoyerAM Dorna Bloom/Date 09/16/20

## 2020-09-16 NOTE — Discharge Summary (Addendum)
Physician Discharge Summary  [name] KZS:[id number] DOB: Aug 01, 1945 DOA: 09/10/2020  PCP: Patient, No Pcp Per (Inactive)  Admit date: 09/10/2020 Discharge date: 09/16/2020 30 Day Unplanned Readmission Risk Score   Flowsheet Row ED to Hosp-Admission (Current) from 09/10/2020 in [place] NEURO/TRAUMA/SURGICAL ICU  30 Day Unplanned Readmission Risk Score (%) 8.19 Filed at 09/16/2020 1201     This score is the patient's risk of an unplanned readmission within 30 days of being discharged (0 -100%). The score is based on dignosis, age, lab data, medications, orders, and past utilization.   Low:  0-14.9   Medium: 15-21.9   High: 22-29.9   Extreme: 30 and above         Admitted From: Home Disposition: CIR  Recommendations for Outpatient Follow-up:  1. Follow up with PCP in 1-2 weeks 2. Follow-up with IR 3. Please take aspirin and Brilinta for 3 months followed by aspirin alone. 4. Follow-up with neurology in 4 weeks. 5. Please obtain BMP/CBC in one week 6. Please follow up with your PCP on the following pending results: Unresulted Labs (From admission, onward)          Start     Ordered   09/17/20 0500  Platelet inhibition p2y12 (Not at [facility])  Tomorrow morning,   R       Question:  Specimen collection method  Answer:  Lab=Lab collect   09/16/20 1128   Signed and Held  Comprehensive metabolic panel  Tomorrow morning,   R       Question:  Specimen collection method  Answer:  Lab=Lab collect   Signed and Held   Signed and Held  CBC WITH DIFFERENTIAL  Tomorrow morning,   R       Question:  Specimen collection method  Answer:  Lab=Lab collect   Signed and Held            Home Health: None Equipment/Devices: None  Discharge Condition: Stable CODE STATUS: Full code Diet recommendation: Cardiac  Subjective: Seen and examined.  She has no complaints.  She is fully alert and oriented.  There is only very minimal weakness on the left side.  When asked from her if she is ready to go  to CIR, she said "I am in fact ready to go home".  Brief/Interim Summary: 75 y.o.femalewith medical history significant ofHTN,not on any antihypertensive medications. she is from [place] and last PCP visit was more than a year ago and she is without medication for thatduration. She presented with dizziness and unsteady gait for 2 weekswhich has been worse on the morning of admission, which brought her to the emergency room. After she arrived in the ED she has developedleft-sided weakness,left-sided facial droop and left neglect. Stroke code was activated. Exact time of onset of symptoms unknown since patient has been having unsteady gait for few days, Patientalsoreports intermittent headache because of uncontrolled hypertension. Patient symptoms had resolved within an hour.  Upon arrival, she was hemodynamically stable, influenza as well as COVID-negative and alcohol less than 10.  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 Patient was seen by neurologist Dr. Webb Silversmith stroke work-up,dual antiplatelet agents,aspirin and Plavix,statins.MRI brain showed R parietal small to moderate size acute and subacute infarct with few additional punctate acute to subacute R centrum semiovale infarcts noted.  CTA head and neck:Age indeterminate occlusion of right cervical ICA just beyond the origin. Less than 50% stenosis of left ICA origin. On the evening of 5/6, patient was noted to have acute weakness  on the L, prompting code stroke. Pt underwent emergent thrombectomy, since transferred to ICU. Neurology had initially recommended ASA 75m with Brilinta 945mbid x 3 months followed by ASA alone.  On late night of 09/14/2020 patient was noted to have right hand pain and numbness, worsened.  CT head was done which showed subarachnoid hemorrhage involving the right sylvian fissures with evolving right MCA infarct.  Case was discussed with neurology as well as IR.  Patient's  antiplatelets were held.  Repeat CT head was done today on 09/16/2020 which showed stable hemorrhage.  IR checked P2Y12.  Neurology as well as IR recommended resuming aspirin 81 mg p.o. daily and Brilinta 60 mg p.o. twice daily.  They cleared the patient for discharge.  Patient was already evaluated by PT OT who recommended CIR.  CIR has been arranged for her.  She is being discharged in stable condition.  Patient was also started on atorvastatin as well as amlodipine to control her blood pressure.  IR recommends repeating P2Y12 tomorrow.    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 Start taking on: Sep 17, 2020   aspirin 81 MG EC tablet Take 1 tablet (81 mg total) by mouth daily. Swallow whole. Start taking on: Sep 17, 2020   atorvastatin 80 MG tablet Commonly known as: LIPITOR Take 1 tablet (80 mg total) by mouth at bedtime.   ibuprofen 200 MG tablet Commonly known as: ADVIL Take 200 mg by mouth every 6 (six) hours as needed for headache.   ticagrelor 60 MG Tabs tablet Commonly known as: BRILINTA Take 1 tablet (60 mg total) by mouth 2 (two) times daily.       Follow-up Information    de MaSindy MessingKaErven CollaMD Follow up in 3 month(s).   Specialties: Radiology, Interventional Radiology Why: Please follow-up with Dr. deKarenann Caior carotid [place] by clinic visit 3 months after discharge. Our office will call you to set up these appointments. Contact information: [address] [phone]             No Known Allergies  Consultations: Neurology and IR   Procedures/Studies: CT Angio Head W or Wo Contrast  Result Date: 09/10/2020 CLINICAL DATA:  Right gaze preference and left facial droop EXAM: CT ANGIOGRAPHY HEAD AND NECK TECHNIQUE: Multidetector CT imaging of the head and neck was performed using the standard protocol during bolus administration of intravenous contrast. Multiplanar CT image reconstructions and MIPs were obtained to evaluate the vascular anatomy. Carotid stenosis measurements (when applicable) are obtained utilizing NASCET criteria, using the distal internal carotid diameter as the denominator. CONTRAST:  7564mMNIPAQUE IOHEXOL 350 MG/ML SOLN COMPARISON:  None. FINDINGS: CTA NECK Aortic arch: Eccentric noncalcified plaque along the visualized distal aortic arch. Great vessel origins are patent. No high-grade proximal subclavian stenosis. Right carotid system: Common carotid is patent. There is mixed plaque at the bifurcation. ICA is occluded just beyond the origin. ECA is patent. Left carotid system: Patent. Atherosclerotic wall thickening along the common carotid. Mixed but  primarily calcified plaque at the bifurcation and along the proximal internal carotid. There is less than 50% stenosis. Vertebral arteries: Patent. Left vertebral artery slightly dominant. Mild plaque at the origins. Skeleton: Degenerative changes of the included spine. Other neck: No acute abnormality. Upper chest: No apical lung mass. Review of the MIP images confirms the above findings CTA HEAD Anterior circulation: At least partial reconstitution of the right ICA at the clinoid where there is calcified plaque. Right anterior and middle cerebral arteries are patent. Atherosclerotic irregularity of the right M1 MCA with mild and moderate stenoses. Mild distal branch atherosclerotic irregularity. Left intracranial internal carotid arteries patent with calcified plaque causing mild stenosis. Left anterior and  middle cerebral arteries are patent. Atherosclerotic irregularity of the left M1 MCA with mild stenosis. Mild distal branch atherosclerotic irregularity. Posterior circulation: Intracranial vertebral arteries are patent. Basilar artery is patent. Major cerebellar artery origins are patent. Posterior cerebral arteries are patent. Focal moderate stenosis of the right P2 PCA. A right posterior communicating artery is present. Venous sinuses: Patent as allowed by contrast bolus timing. 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 At least partial reconstitution at the clinoid intracranially. Less than 50% stenosis at the left ICA origin. Mild to moderate intracranial atherosclerosis. Results were discussed by telephone at the time of interpretation on 09/10/2020 at 4:34 pm with provider Dr. Curly Shores, who verbally acknowledged these results. Electronically Signed   By: Macy Mis M.D.   On: 09/10/2020 16:37   CT HEAD WO CONTRAST  Result Date: 09/16/2020 CLINICAL DATA:  Stroke follow-up EXAM: CT HEAD WITHOUT CONTRAST TECHNIQUE: Contiguous axial images were obtained from the base of the skull through the vertex without intravenous contrast. COMPARISON:  Yesterday FINDINGS: Brain: Subarachnoid and intraventricular hemorrhage is non progressed and similar in distribution. Nonprogressive right parietal infarct. No hydrocephalus, mass, or midline shift. Vascular: No hyperdense vessel or unexpected calcification. Skull: Normal. Negative for fracture or focal lesion. Sinuses/Orbits: No acute finding. IMPRESSION: No new abnormality or progression of intracranial hemorrhage and right cerebral infarction. Electronically Signed   By: Monte Fantasia M.D.   On: 09/16/2020 10:58   CT HEAD WO CONTRAST  Result Date: 09/15/2020 CLINICAL DATA:  Initial evaluation for new onset right arm weakness, history of recent stroke. EXAM: CT  HEAD WITHOUT CONTRAST TECHNIQUE: Contiguous axial images were obtained from the base of the skull through the vertex without intravenous contrast. COMPARISON:  Prior studies from 09/11/2020. FINDINGS: Brain: Patchy hypodensities involving the right basal ganglia and posterior right parieto-occipital region, consistent with evolving acute to subacute right MCA distribution infarcts. Since previous exam, there has been interval development of scattered subarachnoid hemorrhage involving the right sylvian fissure and overlying right frontal cortical sulci. Small volume intraventricular hemorrhage seen as well, with blood seen layering within the occipital horns of both lateral ventricles, likely related to redistribution. No hydrocephalus or ventricular trapping. No significant mass effect or midline shift. Basilar cisterns remain patent. No other definite acute large vessel territory infarct. No mass lesion or extra-axial fluid collection. Underlying atrophy with chronic microvascular ischemic disease again noted. Vascular: No visible hyperdense vessel. Skull: Scalp soft tissues and calvarium within normal limits. Sinuses/Orbits: Globes and orbital soft tissues within normal limits. Moderate mucosal thickening in opacity seen throughout the paranasal sinuses, with air-fluid levels present within both maxillary sinuses. Small right mastoid effusion noted. Other: None. IMPRESSION: 1. Interval development of scattered subarachnoid hemorrhage involving the right  Sylvian fissure and overlying right frontal cortical sulci. Small volume intraventricular hemorrhage seen as well, likely related to redistribution. No hydrocephalus or ventricular trapping. 2. Evolving acute to subacute right MCA distribution infarcts. No significant mass effect or midline shift. 3. No other new acute intracranial abnormality. 4. Underlying atrophy with chronic microvascular ischemic disease. Critical Value/emergent results were called by  telephone at the time of interpretation on 09/15/2020 at 2:18 am to provider MCNEILL [facility] , who verbally acknowledged these results. Electronically Signed   By: Jeannine Boga M.D.   On: 09/15/2020 02:33   CT Angio Neck W and/or Wo Contrast  Result Date: 09/10/2020 CLINICAL DATA:  Right gaze preference and left facial droop EXAM: CT ANGIOGRAPHY HEAD AND NECK TECHNIQUE: Multidetector CT imaging of the head and neck was performed using the standard protocol during bolus administration of intravenous contrast. Multiplanar CT image reconstructions and MIPs were obtained to evaluate the vascular anatomy. Carotid stenosis measurements (when applicable) are obtained utilizing NASCET criteria, using the distal internal carotid diameter as the denominator. CONTRAST:  63m OMNIPAQUE IOHEXOL 350 MG/ML SOLN COMPARISON:  None. FINDINGS: CTA NECK Aortic arch: Eccentric noncalcified plaque along the visualized distal aortic arch. Great vessel origins are patent. No high-grade proximal subclavian stenosis. Right carotid system: Common carotid is patent. There is mixed plaque at the bifurcation. ICA is occluded just beyond the origin. ECA is patent. Left carotid system: Patent. Atherosclerotic wall thickening along the common carotid. Mixed but primarily calcified plaque at the bifurcation and along the proximal internal carotid. There is less than 50% stenosis. Vertebral arteries: Patent. Left vertebral artery slightly dominant. Mild plaque at the origins. Skeleton: Degenerative changes of the included spine. Other neck: No acute abnormality. Upper chest: No apical lung mass. Review of the MIP images confirms the above findings CTA HEAD Anterior circulation: At least partial reconstitution of the right ICA at the clinoid where there is calcified plaque. Right anterior and middle cerebral arteries are patent. Atherosclerotic irregularity of the right M1 MCA with mild and moderate stenoses. Mild distal branch  atherosclerotic irregularity. Left intracranial internal carotid arteries patent with calcified plaque causing mild stenosis. Left anterior and middle cerebral arteries are patent. Atherosclerotic irregularity of the left M1 MCA with mild stenosis. Mild distal branch atherosclerotic irregularity. Posterior circulation: Intracranial vertebral arteries are patent. Basilar artery is patent. Major cerebellar artery origins are patent. Posterior cerebral arteries are patent. Focal moderate stenosis of the right P2 PCA. A right posterior communicating artery is present. Venous sinuses: Patent as allowed by contrast bolus timing. 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 At least partial reconstitution at the clinoid intracranially. Less than 50% stenosis at the left ICA origin. Mild to moderate intracranial atherosclerosis. Results were discussed by telephone at the time of interpretation on 09/10/2020 at 4:34 pm with provider Dr. BCurly Shores who verbally acknowledged these results. Electronically Signed   By: PMacy MisM.D.   On: 09/10/2020 16:37   MR BRAIN WO CONTRAST  Result Date: 09/10/2020 CLINICAL DATA:  ICA occlusion, code stroke follow-up EXAM: MRI HEAD WITHOUT CONTRAST TECHNIQUE: Multiplanar, multiecho pulse sequences of the brain and surrounding structures were obtained without intravenous contrast. COMPARISON:  Prior CT FINDINGS: Brain: There is diffusion hyperintensity in the right parietal lobe corresponding to infarct on CT. There is mixed ADC including hypointensity. Few additional punctate foci of mildly reduced diffusion in the right centrum semiovale. There is no evidence of  intracranial hemorrhage. There is no intracranial mass or significant mass effect. There is no hydrocephalus or extra-axial fluid collection. Prominence of the ventricles and sulci reflects generalized parenchymal volume loss. Other  patchy foci of T2 hyperintensity in the supratentorial white matter are nonspecific but may reflect mild chronic microvascular ischemic changes. Vascular: Loss of the flow void of the visualized cervical right ICA and proximal intracranial portion corresponding to findings on CTA. Skull and upper cervical spine: Normal marrow signal is preserved. Sinuses/Orbits: Paranasal sinus mucosal thickening with maxillary sinus air-fluid levels. Orbits are unremarkable. Other: Sella is unremarkable.  Mastoid air cells are clear. 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 No evidence of hemorrhage. Electronically Signed   By: Macy Mis M.D.   On: 09/10/2020 19:08   IR INTRAVSC STENT CERV CAROTID W/O EMB-PROT MOD SED  Result Date: 09/14/2020 INDICATION: 75 year old female with past medical history significant for hypertension, COPD, aortic aneurysm, DVT in pregnancy, former smoking. She was admitted on 09/10/2020 due to history of dizziness and imbalance for 2-3 weeks. 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 Initial NIHSS was 6. However, symptoms resolved during patient evaluation. On 09/11/2020, patient was initially evaluated in the morning and found to have NIHSS of 1 for dysarthria which she refers as her baseline. At 7:25 p.m., patient was found to have new onset of left-sided weakness and neglect, NIHSS 8. Her last known at baseline was 7 p.m. repeat CT angiogram of the head and neck showed new stenosis of the right M1 segment. Case was discussed in the family with details by Dr. Amie Portland, MD. Decision was made to proceed with a diagnostic cerebral angiogram and mechanical thrombectomy. EXAM: ULTRASOUND-GUIDED VASCULAR ACCESS DIAGNOSTIC CEREBRAL ANGIOGRAM MECHANICAL THROMBECTOMY RIGHT INTERNAL  CAROTID ARTERY ANGIOPLASTY AND STENTING FLAT PANEL HEAD CT COMPARISON:  CT/CT angiogram of the head and neck Sep 11, 2020 MEDICATIONS: Cangrelor IV bolus and drip. ANESTHESIA/SEDATION: The procedure was performed under general anesthesia. CONTRAST:  80 mL of Omnipaque 240 milligram/mL FLUOROSCOPY TIME:  Fluoroscopy Time: 74 minutes 36 seconds (2,200 mGy). COMPLICATIONS: None immediate. TECHNIQUE: Informed written consent was obtained from the patient's daughter after a thorough discussion of the procedural risks, benefits and alternatives. All questions were addressed. Maximal Sterile Barrier Technique was utilized including caps, mask, sterile gowns, sterile gloves, sterile drape, hand hygiene and skin antiseptic. A timeout was performed prior to the initiation of the procedure. The right groin was prepped and draped in the usual sterile fashion. Using a micropuncture kit and the modified Seldinger technique, access was gained to the right common femoral artery and an 8 French sheath was placed. Real-time ultrasound guidance was utilized for vascular access including the acquisition of a permanent ultrasound image documenting patency of the accessed vessel. Under fluoroscopy, an 8 [place] emboguard guide catheter was navigated over a 6 [place] Berenstein 2 catheter and a 0.035" Terumo Glidewire into the aortic arch. The catheter was placed into the right common carotid artery. Frontal and lateral angiograms of the neck and skull base were obtained. Attempted navigation through and occluded right carotid bulb with a phenom 21 microcatheter and Aristotle 14 microguidewire proved unsuccessful. Next, a Berenstein 2 catheter was navigated over a 0.035" Terumo Glidewire through the carotid occlusion into the proximal cervical segment of the right ICA. Frontal and lateral  angiograms of the head and neck were obtained. FINDINGS: 1. Patent right common femoral artery. 2. 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 3. Opacification of the ophthalmic segment of the right ICA via right ophthalmic artery, suggesting chronic occlusion. Patent right MCA noted with possible nonocclusive filling defect at the distal M1 segment. 4. Decrease caliber and irregular lumen of the cervical right ICA with non flow limiting dissection at the proximal segment noted after crossing the carotid bulb occlusion. Near occlusion of the distal right M1/MCA with minimal contrast penetration M2 branches. PROCEDURE: The Berenstein 2 catheter was exchanged over the wire for an exchange length 0.018 " command microwire for a large bore catheter navigated over a phenom 21 microcatheter. The catheter was placed into the cavernous segment of the right ICA. Frontal and lateral angiograms of the head were obtained. Near occlusion of the distal right M1/MCA segment confirmed. Under biplane roadmap, a large bore aspiration catheter was navigated over a phenom 21 microcatheter and a synchro support microguidewire into the cavernous segment of the right ICA. The microcatheter was then navigated over the wire into the left M2/MCA posterior division branch. Then, a 5 x 37 mm embotrap stent retriever was deployed spanning the M1 M2 segment. The device was allowed to intercalated with the clot for 4 minutes. The microcatheter was removed. The aspiration catheter was advanced to the level of occlusion and connected to a penumbra aspiration pump. The guiding catheter was advanced into the upper cervical segment of the right ICA and the balloon was inflated. The thrombectomy device and aspiration catheter were removed under constant aspiration. Right internal carotid artery angiogram showed complete recanalization of the right MCA vascular tree (TICI3). The guiding catheter was then retracted into the right common carotid artery. Frontal and lateral angiograms of the neck were obtained. Decrease caliber of cervical right ICA with prominent lumen  irregularity and ulceration at the bulb. Flat panel CT of the head was obtained and post processed in a separate workstation with concurrent attending physician supervision. Selected images were sent to PACS. No evidence of hemorrhagic complication. Right common carotid artery angiograms were obtained with frontal and lateral views of the neck, in the working projections. Next, a 6-8 x 40 mm XACT carotid stent was deployed across the right carotid bifurcation. Frontal and lateral angiograms of the neck showed good position of the carotid stent with moderate in stent stenosis. In stent angioplasty was then performed using a 4 x 30 mm Viatrac balloon with resolution of the InStent stenosis. Follow-up right common carotid artery angiogram showed progressed improvement of the caliber of the cervical right ICA distal to the stent with unimpeded flow through the stent. Right common carotid artery angiograms with frontal and lateral views of the head showed no evidence of thromboembolic complication with excellent anterograde flow in the right anterior circulation. Delay angiograms of the right common carotid artery with frontal and lateral views of the neck showed stable appearance of the recently deployed stent without evidence of in stent clot formation. Non flow limiting dissection of the mid cervical segment appears stable. The catheter was subsequently withdrawn. Right common femoral artery angiograms with frontal and lateral views showed access at the mid segment of the right common femoral artery which has adequate caliber for closure device. The femoral sheath was exchanged over the wire for a Perclose pro style which was utilized for access closure. Immediate hemostasis was achieved. IMPRESSION: Successful right MCA mechanical thrombectomy and cervical  right ICA angioplasty and stenting for treatment of a right ICA occlusion at the bulb with embolus to the right MCA. Complete revascularization of the right MCA  achieved (TICI 3). No evidence of hemorrhagic or thromboembolic complication. PLAN: 1. Transition to dual antiplatelet therapy with aspirin and Plavix once cangrelor drip is discontinued. 2. Transferred to ICU for continued care. 3. Goal SBP 120-140 mgHg. Electronically Signed   By: Pedro Earls M.D.   On: 09/14/2020 15:27   IR CT Head Ltd  Result Date: 09/14/2020 INDICATION: 75 year old female with past medical history significant for hypertension, COPD, aortic aneurysm, DVT in pregnancy, former smoking. She was admitted on 09/10/2020 due to history of dizziness and imbalance for 2-3 weeks. 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 Initial NIHSS was 6. However, symptoms resolved during patient evaluation. On 09/11/2020, patient was initially evaluated in the morning and found to have NIHSS of 1 for dysarthria which she refers as her baseline. At 7:25 p.m., patient was found to have new onset of left-sided weakness and neglect, NIHSS 8. Her last known at baseline was 7 p.m. repeat CT angiogram of the head and neck showed new stenosis of the right M1 segment. Case was discussed in the family with details by Dr. Amie Portland, MD. Decision was made to proceed with a diagnostic cerebral angiogram and mechanical thrombectomy. EXAM: ULTRASOUND-GUIDED VASCULAR ACCESS DIAGNOSTIC CEREBRAL ANGIOGRAM MECHANICAL THROMBECTOMY RIGHT INTERNAL CAROTID ARTERY ANGIOPLASTY AND STENTING FLAT PANEL HEAD CT COMPARISON:  CT/CT angiogram of the head and neck Sep 11, 2020 MEDICATIONS: Cangrelor IV bolus and drip. ANESTHESIA/SEDATION: The procedure was performed under general anesthesia. CONTRAST:  80 mL of Omnipaque 240 milligram/mL FLUOROSCOPY TIME:  Fluoroscopy Time: 74 minutes 36 seconds (2,200 mGy). COMPLICATIONS: None immediate. TECHNIQUE: Informed written consent was obtained from the patient's daughter after a thorough  discussion of the procedural risks, benefits and alternatives. All questions were addressed. Maximal Sterile Barrier Technique was utilized including caps, mask, sterile gowns, sterile gloves, sterile drape, hand hygiene and skin antiseptic. A timeout was performed prior to the initiation of the procedure. The right groin was prepped and draped in the usual sterile fashion. Using a micropuncture kit and the modified Seldinger technique, access was gained to the right common femoral artery and an 8 French sheath was placed. Real-time ultrasound guidance was utilized for vascular access including the acquisition of a permanent ultrasound image documenting patency of the accessed vessel. Under fluoroscopy, an 8 [place] emboguard guide catheter was navigated over a 6 [place] Berenstein 2 catheter and a 0.035" Terumo Glidewire into the aortic arch. The catheter was placed into the right common carotid artery. Frontal and lateral angiograms of the neck and skull base were obtained. Attempted navigation through and occluded right carotid bulb with a phenom 21 microcatheter and Aristotle 14 microguidewire proved unsuccessful. Next, a Berenstein 2 catheter was navigated over a 0.035" Terumo Glidewire through the carotid occlusion into the proximal cervical segment of the right ICA. Frontal and lateral angiograms of the head and neck were obtained. FINDINGS: 1. Patent right common femoral artery. 2. 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 3. Opacification of the ophthalmic segment of the right ICA via right ophthalmic artery, suggesting chronic occlusion. Patent right MCA noted with possible nonocclusive filling defect at the distal M1 segment. 4. Decrease caliber and irregular lumen of the cervical right ICA with non  flow limiting dissection at the proximal segment noted after crossing the carotid bulb occlusion. Near occlusion of the distal right M1/MCA with minimal contrast penetration M2  branches. PROCEDURE: The Berenstein 2 catheter was exchanged over the wire for an exchange length 0.018 " command microwire for a large bore catheter navigated over a phenom 21 microcatheter. The catheter was placed into the cavernous segment of the right ICA. Frontal and lateral angiograms of the head were obtained. Near occlusion of the distal right M1/MCA segment confirmed. Under biplane roadmap, a large bore aspiration catheter was navigated over a phenom 21 microcatheter and a synchro support microguidewire into the cavernous segment of the right ICA. The microcatheter was then navigated over the wire into the left M2/MCA posterior division branch. Then, a 5 x 37 mm embotrap stent retriever was deployed spanning the M1 M2 segment. The device was allowed to intercalated with the clot for 4 minutes. The microcatheter was removed. The aspiration catheter was advanced to the level of occlusion and connected to a penumbra aspiration pump. The guiding catheter was advanced into the upper cervical segment of the right ICA and the balloon was inflated. The thrombectomy device and aspiration catheter were removed under constant aspiration. Right internal carotid artery angiogram showed complete recanalization of the right MCA vascular tree (TICI3). The guiding catheter was then retracted into the right common carotid artery. Frontal and lateral angiograms of the neck were obtained. Decrease caliber of cervical right ICA with prominent lumen irregularity and ulceration at the bulb. Flat panel CT of the head was obtained and post processed in a separate workstation with concurrent attending physician supervision. Selected images were sent to PACS. No evidence of hemorrhagic complication. Right common carotid artery angiograms were obtained with frontal and lateral views of the neck, in the working projections. Next, a 6-8 x 40 mm XACT carotid stent was deployed across the right carotid bifurcation. Frontal and lateral  angiograms of the neck showed good position of the carotid stent with moderate in stent stenosis. In stent angioplasty was then performed using a 4 x 30 mm Viatrac balloon with resolution of the InStent stenosis. Follow-up right common carotid artery angiogram showed progressed improvement of the caliber of the cervical right ICA distal to the stent with unimpeded flow through the stent. Right common carotid artery angiograms with frontal and lateral views of the head showed no evidence of thromboembolic complication with excellent anterograde flow in the right anterior circulation. Delay angiograms of the right common carotid artery with frontal and lateral views of the neck showed stable appearance of the recently deployed stent without evidence of in stent clot formation. Non flow limiting dissection of the mid cervical segment appears stable. The catheter was subsequently withdrawn. Right common femoral artery angiograms with frontal and lateral views showed access at the mid segment of the right common femoral artery which has adequate caliber for closure device. The femoral sheath was exchanged over the wire for a Perclose pro style which was utilized for access closure. Immediate hemostasis was achieved. 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 Complete revascularization of the right MCA achieved (TICI 3). No evidence of hemorrhagic or thromboembolic complication. PLAN: 1. Transition to dual antiplatelet therapy with aspirin and Plavix once cangrelor drip is discontinued. 2. Transferred to ICU for continued care. 3. Goal SBP 120-140 mgHg. Electronically Signed   By: Erven Colla  Karenann Cai M.D.   On: 09/14/2020 15:27   IR US Guide Vasc Access Right  Result Date: 09/14/2020 INDICATION: 75 year old female with past medical history significant for  hypertension, COPD, aortic aneurysm, DVT in pregnancy, former smoking. She was admitted on 09/10/2020 due to history of dizziness and imbalance for 2-3 weeks. 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 Initial NIHSS was 6. However, symptoms resolved during patient evaluation. On 09/11/2020, patient was initially evaluated in the morning and found to have NIHSS of 1 for dysarthria which she refers as her baseline. At 7:25 p.m., patient was found to have new onset of left-sided weakness and neglect, NIHSS 8. Her last known at baseline was 7 p.m. repeat CT angiogram of the head and neck showed new stenosis of the right M1 segment. Case was discussed in the family with details by Dr. Amie Portland, MD. Decision was made to proceed with a diagnostic cerebral angiogram and mechanical thrombectomy. EXAM: ULTRASOUND-GUIDED VASCULAR ACCESS DIAGNOSTIC CEREBRAL ANGIOGRAM MECHANICAL THROMBECTOMY RIGHT INTERNAL CAROTID ARTERY ANGIOPLASTY AND STENTING FLAT PANEL HEAD CT COMPARISON:  CT/CT angiogram of the head and neck Sep 11, 2020 MEDICATIONS: Cangrelor IV bolus and drip. ANESTHESIA/SEDATION: The procedure was performed under general anesthesia. CONTRAST:  80 mL of Omnipaque 240 milligram/mL FLUOROSCOPY TIME:  Fluoroscopy Time: 74 minutes 36 seconds (2,200 mGy). COMPLICATIONS: None immediate. TECHNIQUE: Informed written consent was obtained from the patient's daughter after a thorough discussion of the procedural risks, benefits and alternatives. All questions were addressed. Maximal Sterile Barrier Technique was utilized including caps, mask, sterile gowns, sterile gloves, sterile drape, hand hygiene and skin antiseptic. A timeout was performed prior to the initiation of the procedure. The right groin was prepped and draped in the usual sterile fashion. Using a micropuncture kit and the modified Seldinger technique, access was gained to the  right common femoral artery and an 8 French sheath was placed. Real-time ultrasound guidance was utilized for vascular access including the acquisition of a permanent ultrasound image documenting patency of the accessed vessel. Under fluoroscopy, an 8 [place] emboguard guide catheter was navigated over a 6 [place] Berenstein 2 catheter and a 0.035" Terumo Glidewire into the aortic arch. The catheter was placed into the right common carotid artery. Frontal and lateral angiograms of the neck and skull base were obtained. Attempted navigation through and occluded right carotid bulb with a phenom 21 microcatheter and Aristotle 14 microguidewire proved unsuccessful. Next, a Berenstein 2 catheter was navigated over a 0.035" Terumo Glidewire through the carotid occlusion into the proximal cervical segment of the right ICA. Frontal and lateral angiograms of the head and neck were obtained. FINDINGS: 1. Patent right common femoral artery. 2. 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 3. Opacification of the ophthalmic segment of the right ICA via right ophthalmic artery, suggesting chronic occlusion. Patent right MCA noted with possible nonocclusive filling defect at the distal M1 segment. 4. Decrease caliber and irregular lumen of the cervical right ICA with non flow limiting dissection at the proximal segment noted after crossing the carotid bulb occlusion. Near occlusion of the distal right M1/MCA with minimal contrast penetration M2 branches. PROCEDURE: The Berenstein 2 catheter was exchanged over the wire for an exchange length 0.018 " command microwire for a large bore catheter navigated over a phenom 21 microcatheter. The catheter was placed into the cavernous segment of the right ICA. Frontal and  lateral angiograms of the head were obtained. Near occlusion of the distal right M1/MCA segment confirmed. Under biplane roadmap, a large bore aspiration catheter was navigated over a phenom 21  microcatheter and a synchro support microguidewire into the cavernous segment of the right ICA. The microcatheter was then navigated over the wire into the left M2/MCA posterior division branch. Then, a 5 x 37 mm embotrap stent retriever was deployed spanning the M1 M2 segment. The device was allowed to intercalated with the clot for 4 minutes. The microcatheter was removed. The aspiration catheter was advanced to the level of occlusion and connected to a penumbra aspiration pump. The guiding catheter was advanced into the upper cervical segment of the right ICA and the balloon was inflated. The thrombectomy device and aspiration catheter were removed under constant aspiration. Right internal carotid artery angiogram showed complete recanalization of the right MCA vascular tree (TICI3). The guiding catheter was then retracted into the right common carotid artery. Frontal and lateral angiograms of the neck were obtained. Decrease caliber of cervical right ICA with prominent lumen irregularity and ulceration at the bulb. Flat panel CT of the head was obtained and post processed in a separate workstation with concurrent attending physician supervision. Selected images were sent to PACS. No evidence of hemorrhagic complication. Right common carotid artery angiograms were obtained with frontal and lateral views of the neck, in the working projections. Next, a 6-8 x 40 mm XACT carotid stent was deployed across the right carotid bifurcation. Frontal and lateral angiograms of the neck showed good position of the carotid stent with moderate in stent stenosis. In stent angioplasty was then performed using a 4 x 30 mm Viatrac balloon with resolution of the InStent stenosis. Follow-up right common carotid artery angiogram showed progressed improvement of the caliber of the cervical right ICA distal to the stent with unimpeded flow through the stent. Right common carotid artery angiograms with frontal and lateral views of the  head showed no evidence of thromboembolic complication with excellent anterograde flow in the right anterior circulation. Delay angiograms of the right common carotid artery with frontal and lateral views of the neck showed stable appearance of the recently deployed stent without evidence of in stent clot formation. Non flow limiting dissection of the mid cervical segment appears stable. The catheter was subsequently withdrawn. Right common femoral artery angiograms with frontal and lateral views showed access at the mid segment of the right common femoral artery which has adequate caliber for closure device. The femoral sheath was exchanged over the wire for a Perclose pro style which was utilized for access closure. Immediate hemostasis was achieved. 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 Complete revascularization of the right MCA achieved (TICI 3). No evidence of hemorrhagic or thromboembolic complication. PLAN: 1. Transition to dual antiplatelet therapy with aspirin and Plavix once cangrelor drip is discontinued. 2. Transferred to ICU for continued care. 3. Goal SBP 120-140 mgHg. Electronically Signed   By: Pedro Earls M.D.   On: 09/14/2020 15:27   CT CEREBRAL PERFUSION W CONTRAST  Result Date: 09/11/2020 CLINICAL DATA:  Progressive left-sided weakness. EXAM: CT ANGIOGRAPHY HEAD AND NECK CT PERFUSION BRAIN TECHNIQUE: Multidetector CT imaging of the head and neck was performed using the standard protocol during bolus administration of intravenous contrast. Multiplanar CT image reconstructions and MIPs were obtained to evaluate the vascular anatomy. Carotid  stenosis measurements (when applicable) are obtained utilizing NASCET criteria, using the distal internal carotid diameter as the denominator. Multiphase CT imaging of the brain was performed  following IV bolus contrast injection. Subsequent parametric perfusion maps were calculated using RAPID software. CONTRAST:  166m OMNIPAQUE IOHEXOL 350 MG/ML SOLN COMPARISON:  MR head without contrast, CT head without contrast, and CTA head and neck 09/10/2020. FINDINGS: CTA NECK FINDINGS Aortic arch: No significant interval change in the 3 vessel aortic arch with atherosclerotic calcifications. Right carotid system: The right common carotid artery is unremarkable. The right ICA is occluded at the bifurcation without significant reconstitution in the neck. Left carotid system: The left common carotid artery is within normal limits. Dense calcifications are present at the proximal ICA without a significant stenosis relative to the more distal vessel. There is mild tortuosity without significant stenosis in the cervical ICA. Vertebral arteries: Left vertebral artery is dominant vessel. Both vertebral arteries originate from the subclavian arteries without significant stenosis. No significant stenosis is present in either vertebral artery in the neck. Skeleton: Multilevel degenerative changes are present. Anterior osteophytes are fused at C3-4. No significant interval change. Other neck: No acute abnormality or significant interval change. Upper chest: Lung apices are clear.  Thoracic inlet is normal. Review of the MIP images confirms the above findings CTA HEAD FINDINGS Anterior circulation: The right internal carotid artery remains occluded through the ophthalmic segment. Atherosclerotic changes are present the cavernous left ICA without significant stenosis through the ICA terminus. The left A1 segment and right A1 segment are similar the prior exam. Proximal right M1 segment narrowing has progressed. Is some irregularity in the left M1 segment without significant stenosis. There is significant loss of perfusion right MCA branch vessels compared to prior exam. Left MCA branch vessels remain intact. ACA vessels are  unchanged. Posterior circulation: Moderate narrowing is present at the dural margin of the right vertebral artery, progressive from the prior exam. Left vertebral artery is unchanged. Vertebrobasilar junction normal. The basilar artery is small. Both posterior cerebral arteries originate from the basilar tip. Prominent right posterior communicating artery is present. There is segmental attenuation PCA branch vessels similar the prior study. Venous sinuses: Dural sinuses are patent. Straight sinus and deep cerebral veins are intact. Anatomic variants: None Review of the MIP images confirms the above findings CT Brain Perfusion Findings: ASPECTS: 10/10 CBF (<30%) Volume: 033mPerfusion (Tmax>6.0s) volume: 16255mismatch Volume: 162m57mfarction Location:Right MCA territory IMPRESSION: 1. 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 2. 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 3. Progressive proximal right M1 segment stenosis with significant loss of perfusion in the right MCA branch vessels. 4. Stable atherosclerotic changes at the left carotid bifurcation without significant stenosis. 5. 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 Basilar artery is similar the prior exam. 6.  Aortic Atherosclerosis (ICD10-I70.0). Electronically Signed   By: ChriSan Morelle.   On: 09/11/2020 20:25   ECHOCARDIOGRAM COMPLETE  Result Date: 09/12/2020    ECHOCARDIOGRAM REPORT   Patient Name:   Debra KOPFe of Exam: 09/12/2020 Medical Rec #:  [id number]:       70.0 in Accession #:    [id number]:       210.0 lb Date of Birth:  10/106/22/1947A:          2.131 m Patient Age:    74 y56rs  BP:           99/45 mmHg Patient Gender: F           HR:           36 bpm. Exam Location:  Inpatient Procedure: 2D Echo, Cardiac Doppler and Color Doppler Indications:    TIA  History:         Patient has no prior history of Echocardiogram examinations.                 CHF, COPD; Risk Factors:Hypertension and Diabetes.  Sonographer:    Luisa Hart RDCS Referring Phys: [place]  Sonographer Comments: No cardiac surgery noted in chart. IMPRESSIONS  1. Left ventricular ejection fraction, by estimation, is 60 to 65%. The left ventricle has normal function. The left ventricle has no regional wall motion abnormalities. There is mild left ventricular hypertrophy. Left ventricular diastolic parameters are consistent with Grade I diastolic dysfunction (impaired relaxation).  2. Right ventricular systolic function is normal. The right ventricular size is normal. Tricuspid regurgitation signal is inadequate for assessing PA pressure.  3. Left atrial size was moderately dilated.  4. Right atrial size was mildly dilated.  5. The mitral valve is normal in structure. Trivial mitral valve regurgitation. No evidence of mitral stenosis.  6. The aortic valve is grossly normal. There is mild calcification of the aortic valve. Aortic valve regurgitation is not visualized. No aortic stenosis is present.  7. Aortic dilatation noted. There is borderline dilatation of the ascending aorta, measuring 39 mm.  8. The inferior vena cava is normal in size with greater than 50% respiratory variability, suggesting right atrial pressure of 3 mmHg. Conclusion(s)/Recommendation(s): No intracardiac source of embolism detected on this transthoracic study. A transesophageal echocardiogram is recommended to exclude cardiac source of embolism if clinically indicated. FINDINGS  Left Ventricle: Left ventricular ejection fraction, by estimation, is 60 to 65%. The left ventricle has normal function. The left ventricle has no regional wall motion abnormalities. The left ventricular internal cavity size was normal in size. There is  mild left ventricular hypertrophy. Left ventricular diastolic parameters are consistent with Grade I  diastolic dysfunction (impaired relaxation). Right Ventricle: The right ventricular size is normal. No increase in right ventricular wall thickness. Right ventricular systolic function is normal. Tricuspid regurgitation signal is inadequate for assessing PA pressure. Left Atrium: Left atrial size was moderately dilated. Right Atrium: Right atrial size was mildly dilated. Pericardium: There is no evidence of pericardial effusion. Mitral Valve: The mitral valve is normal in structure. Trivial mitral valve regurgitation. No evidence of mitral valve stenosis. Tricuspid Valve: The tricuspid valve is normal in structure. Tricuspid valve regurgitation is not demonstrated. No evidence of tricuspid stenosis. Aortic Valve: The aortic valve is grossly normal. There is mild calcification of the aortic valve. Aortic valve regurgitation is not visualized. No aortic stenosis is present. Aortic valve mean gradient measures 4.0 mmHg. Aortic valve peak gradient measures 9.5 mmHg. Aortic valve area, by VTI measures 3.12 cm. Pulmonic Valve: The pulmonic valve was normal in structure. Pulmonic valve regurgitation is trivial. No evidence of pulmonic stenosis. Aorta: Aortic dilatation noted. There is borderline dilatation of the ascending aorta, measuring 39 mm. Venous: The inferior vena cava is normal in size with greater than 50% respiratory variability, suggesting right atrial pressure of 3 mmHg. IAS/Shunts: No atrial level shunt detected by color flow Doppler. EKG: Rhythm strip during this exam demostrated sinus bradycardia.  LEFT VENTRICLE PLAX 2D LVIDd:  5.20 cm      Diastology LV PW:         1.20 cm      LV e' medial:    5.11 cm/s LV IVS:        1.30 cm      LV E/e' medial:  15.2 LVOT diam:     2.30 cm      LV e' lateral:   6.57 cm/s LV SV:         115          LV E/e' lateral: 11.9 LV SV Index:   54 LVOT Area:     4.15 cm  LV Volumes (MOD) LV vol d, MOD A2C: 63.5 ml LV vol d, MOD A4C: 130.0 ml LV vol s, MOD A2C: 27.5 ml  LV vol s, MOD A4C: 27.9 ml LV SV MOD A2C:     36.0 ml LV SV MOD A4C:     130.0 ml LV SV MOD BP:      68.8 ml RIGHT VENTRICLE RV S prime:     16.30 cm/s  PULMONARY VEINS TAPSE (M-mode): 3.0 cm      A Reversal Duration: 113.00 msec                             A Reversal Velocity: 32.80 cm/s                             Diastolic Velocity:  47.09 cm/s                             S/D Velocity:        1.40                             Systolic Velocity:   62.83 cm/s LEFT ATRIUM             Index LA diam:        4.30 cm 2.02 cm/m LA Vol (A2C):   95.6 ml 44.86 ml/m LA Vol (A4C):   80.7 ml 37.87 ml/m LA Biplane Vol: 90.0 ml 42.23 ml/m  AORTIC VALVE                   PULMONIC VALVE AV Area (Vmax):    3.26 cm    PV Vmax:       1.03 m/s AV Area (Vmean):   3.10 cm    PV Vmean:      68.500 cm/s AV Area (VTI):     3.12 cm    PV VTI:        0.305 m AV Vmax:           154.00 cm/s PV Peak grad:  4.2 mmHg AV Vmean:          94.300 cm/s PV Mean grad:  2.0 mmHg AV VTI:            0.367 m AV Peak Grad:      9.5 mmHg AV Mean Grad:      4.0 mmHg LVOT Vmax:         121.00 cm/s LVOT Vmean:        70.300 cm/s LVOT VTI:          0.276 m LVOT/AV VTI ratio: 0.75  AORTA Ao Root diam:  3.40 cm Ao Asc diam:  3.90 cm MITRAL VALVE MV Area (PHT): 2.48 cm    SHUNTS MV Decel Time: 306 msec    Systemic VTI:  0.28 m MV E velocity: 77.90 cm/s  Systemic Diam: 2.30 cm MV A velocity: 94.00 cm/s MV E/A ratio:  0.83 Debra Kaiser MD Electronically signed by Debra Kaiser MD Signature Date/Time: 09/12/2020/4:17:21 PM    Final    IR PERCUTANEOUS ART THROMBECTOMY/INFUSION INTRACRANIAL INC DIAG ANGIO  Result Date: 09/14/2020 INDICATION: 75 year old female with past medical history significant for hypertension, COPD, aortic aneurysm, DVT in pregnancy, former smoking. She was admitted on 09/10/2020 due to history of dizziness and imbalance for 2-3 weeks. 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 Initial NIHSS was 6. However, symptoms resolved during patient evaluation. On 09/11/2020, patient was initially evaluated in the morning and found to have NIHSS of 1 for dysarthria which she refers as her baseline. At 7:25 p.m., patient was found to have new onset of left-sided weakness and neglect, NIHSS 8. Her last known at baseline was 7 p.m. repeat CT angiogram of the head and neck showed new stenosis of the right M1 segment. Case was discussed in the family with details by Dr. Amie Portland, MD. Decision was made to proceed with a diagnostic cerebral angiogram and mechanical thrombectomy. EXAM: ULTRASOUND-GUIDED VASCULAR ACCESS DIAGNOSTIC CEREBRAL ANGIOGRAM MECHANICAL THROMBECTOMY RIGHT INTERNAL CAROTID ARTERY ANGIOPLASTY AND STENTING FLAT PANEL HEAD CT COMPARISON:  CT/CT angiogram of the head and neck Sep 11, 2020 MEDICATIONS: Cangrelor IV bolus and drip. ANESTHESIA/SEDATION: The procedure was performed under general anesthesia. CONTRAST:  80 mL of Omnipaque 240 milligram/mL FLUOROSCOPY TIME:  Fluoroscopy Time: 74 minutes 36 seconds (2,200 mGy). COMPLICATIONS: None immediate. TECHNIQUE: Informed written consent was obtained from the patient's daughter after a thorough discussion of the procedural risks, benefits and alternatives. All questions were addressed. Maximal Sterile Barrier Technique was utilized including caps, mask, sterile gowns, sterile gloves, sterile drape, hand hygiene and skin antiseptic. A timeout was performed prior to the initiation of the procedure. The right groin was prepped and draped in the usual sterile fashion. Using a micropuncture kit and the modified Seldinger technique, access was gained to the right common femoral artery and an 8 French sheath was placed. Real-time ultrasound guidance was utilized for vascular access including the acquisition of a permanent ultrasound image documenting patency of the accessed vessel. Under fluoroscopy, an 8 [place]  emboguard guide catheter was navigated over a 6 [place] Berenstein 2 catheter and a 0.035" Terumo Glidewire into the aortic arch. The catheter was placed into the right common carotid artery. Frontal and lateral angiograms of the neck and skull base were obtained. Attempted navigation through and occluded right carotid bulb with a phenom 21 microcatheter and Aristotle 14 microguidewire proved unsuccessful. Next, a Berenstein 2 catheter was navigated over a 0.035" Terumo Glidewire through the carotid occlusion into the proximal cervical segment of the right ICA. Frontal and lateral angiograms of the head and neck were obtained. FINDINGS: 1. Patent right common femoral artery. 2. 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 3. Opacification of the ophthalmic segment of the right ICA via right ophthalmic artery, suggesting chronic occlusion. Patent right MCA noted with possible nonocclusive filling defect at the distal M1 segment. 4. Decrease caliber and irregular lumen of the cervical right ICA with non flow limiting dissection at the proximal segment  noted after crossing the carotid bulb occlusion. Near occlusion of the distal right M1/MCA with minimal contrast penetration M2 branches. PROCEDURE: The Berenstein 2 catheter was exchanged over the wire for an exchange length 0.018 " command microwire for a large bore catheter navigated over a phenom 21 microcatheter. The catheter was placed into the cavernous segment of the right ICA. Frontal and lateral angiograms of the head were obtained. Near occlusion of the distal right M1/MCA segment confirmed. Under biplane roadmap, a large bore aspiration catheter was navigated over a phenom 21 microcatheter and a synchro support microguidewire into the cavernous segment of the right ICA. The microcatheter was then navigated over the wire into the left M2/MCA posterior division branch. Then, a 5 x 37 mm embotrap stent retriever was deployed  spanning the M1 M2 segment. The device was allowed to intercalated with the clot for 4 minutes. The microcatheter was removed. The aspiration catheter was advanced to the level of occlusion and connected to a penumbra aspiration pump. The guiding catheter was advanced into the upper cervical segment of the right ICA and the balloon was inflated. The thrombectomy device and aspiration catheter were removed under constant aspiration. Right internal carotid artery angiogram showed complete recanalization of the right MCA vascular tree (TICI3). The guiding catheter was then retracted into the right common carotid artery. Frontal and lateral angiograms of the neck were obtained. Decrease caliber of cervical right ICA with prominent lumen irregularity and ulceration at the bulb. Flat panel CT of the head was obtained and post processed in a separate workstation with concurrent attending physician supervision. Selected images were sent to PACS. No evidence of hemorrhagic complication. Right common carotid artery angiograms were obtained with frontal and lateral views of the neck, in the working projections. Next, a 6-8 x 40 mm XACT carotid stent was deployed across the right carotid bifurcation. Frontal and lateral angiograms of the neck showed good position of the carotid stent with moderate in stent stenosis. In stent angioplasty was then performed using a 4 x 30 mm Viatrac balloon with resolution of the InStent stenosis. Follow-up right common carotid artery angiogram showed progressed improvement of the caliber of the cervical right ICA distal to the stent with unimpeded flow through the stent. Right common carotid artery angiograms with frontal and lateral views of the head showed no evidence of thromboembolic complication with excellent anterograde flow in the right anterior circulation. Delay angiograms of the right common carotid artery with frontal and lateral views of the neck showed stable appearance of the  recently deployed stent without evidence of in stent clot formation. Non flow limiting dissection of the mid cervical segment appears stable. The catheter was subsequently withdrawn. Right common femoral artery angiograms with frontal and lateral views showed access at the mid segment of the right common femoral artery which has adequate caliber for closure device. The femoral sheath was exchanged over the wire for a Perclose pro style which was utilized for access closure. Immediate hemostasis was achieved. 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 Complete revascularization of the right MCA achieved (TICI 3). No evidence of hemorrhagic or thromboembolic complication. PLAN: 1. Transition to dual antiplatelet therapy with aspirin and Plavix once cangrelor drip is discontinued. 2. Transferred to ICU for continued care. 3. Goal SBP 120-140 mgHg. Electronically Signed   By: Pedro Earls M.D.   On:  09/14/2020 15:27   CT HEAD CODE STROKE WO CONTRAST  Result Date: 09/11/2020 CLINICAL DATA:  Code stroke. Neuro deficit. Acute stroke suspected. Dizziness and unsteady gait for 2 weeks. EXAM: CT HEAD WITHOUT CONTRAST TECHNIQUE: Contiguous axial images were obtained from the base of the skull through the vertex without intravenous contrast. COMPARISON:  CT head without contrast and CTA head and neck 09/10/2020. FINDINGS: Brain: Right parietal acute/subacute infarct stable. No new infarct present. Basal ganglia are intact. Insular ribbon normal. The ventricles are of normal size. No significant extraaxial fluid collection is present. Vascular: Atherosclerotic calcifications are present within the cavernous internal carotid arteries bilaterally. No hyperdense vessel is present. Skull: Calvarium is intact. No focal lytic or blastic lesions are present. Sinuses/Orbits:  Bilateral maxillary sinus fluid collections are present. Scattered ethmoid opacification noted. Sphenoid sinuses are clear. Frontal sinuses demonstrate mild mucosal thickening. Mastoid air cells are clear. The globes and orbits are within normal limits. ASPECTS [facility] Stroke Program Early CT Score) - Ganglionic level infarction (caudate, lentiform nuclei, internal capsule, insula, M1-M3 cortex): 7/7 - Supraganglionic infarction (M4-M6 cortex): 3/3 Total score (0-10 with 10 being normal): 10/10 IMPRESSION: 1. Stable appearance of right parietal acute/subacute infarct. 2. No new infarct. 3. ASPECTS is 10/10. Electronically Signed   By: San Morelle M.D.   On: 09/11/2020 20:00   CT HEAD CODE STROKE WO CONTRAST  Result Date: 09/10/2020 CLINICAL DATA:  Code stroke.  Acute neuro deficit EXAM: CT HEAD WITHOUT CONTRAST TECHNIQUE: Contiguous axial images were obtained from the base of the skull through the vertex without intravenous contrast. COMPARISON:  None. FINDINGS: Brain: Ventricle size normal. Mild atrophy. Chronic infarct right parietal lobe. Mild white matter hypodensity bilaterally most consistent with microvascular ischemia Vascular: Negative for hyperdense vessel Skull: Negative Sinuses/Orbits: Mucosal edema in the paranasal sinuses. Air-fluid levels in both maxillary sinus. Mastoid clear bilaterally. Other: None ASPECTS ([place] Stroke Program Early CT Score) - Ganglionic level infarction (caudate, lentiform nuclei, internal capsule, insula, M1-M3 cortex): 7 - Supraganglionic infarction (M4-M6 cortex): 3 Total score (0-10 with 10 being normal): 10 IMPRESSION: 1. No acute abnormality. 2. Chronic infarct right parietal lobe. Microvascular ischemic change in the white matter 3. ASPECTS is 10 4. These results were called by telephone at the time of interpretation on 09/10/2020 at 4:07 pm to provider Eulis Foster , who verbally acknowledged these results. Electronically Signed   By: Franchot Gallo M.D.   On:  09/10/2020 16:08   CT ANGIO HEAD NECK W WO CM W PERF  Result Date: 09/11/2020 CLINICAL DATA:  Progressive left-sided weakness. EXAM: CT ANGIOGRAPHY HEAD AND NECK CT PERFUSION BRAIN TECHNIQUE: Multidetector CT imaging of the head and neck was performed using the standard protocol during bolus administration of intravenous contrast. Multiplanar CT image reconstructions and MIPs were obtained to evaluate the vascular anatomy. Carotid stenosis measurements (when applicable) are obtained utilizing NASCET criteria, using the distal internal carotid diameter as the denominator. Multiphase CT imaging of the brain was performed following IV bolus contrast injection. Subsequent parametric perfusion maps were calculated using RAPID software. CONTRAST:  156m OMNIPAQUE IOHEXOL 350 MG/ML SOLN COMPARISON:  MR head without contrast, CT head without contrast, and CTA head and neck 09/10/2020. FINDINGS: CTA NECK FINDINGS Aortic arch: No significant interval change in the 3 vessel aortic arch with atherosclerotic calcifications. Right carotid system: The right common carotid artery is unremarkable. The right ICA is occluded at the bifurcation without significant reconstitution in the neck. Left carotid system: The left common carotid artery is within  normal limits. Dense calcifications are present at the proximal ICA without a significant stenosis relative to the more distal vessel. There is mild tortuosity without significant stenosis in the cervical ICA. Vertebral arteries: Left vertebral artery is dominant vessel. Both vertebral arteries originate from the subclavian arteries without significant stenosis. No significant stenosis is present in either vertebral artery in the neck. Skeleton: Multilevel degenerative changes are present. Anterior osteophytes are fused at C3-4. No significant interval change. Other neck: No acute abnormality or significant interval change. Upper chest: Lung apices are clear.  Thoracic inlet is  normal. Review of the MIP images confirms the above findings CTA HEAD FINDINGS Anterior circulation: The right internal carotid artery remains occluded through the ophthalmic segment. Atherosclerotic changes are present the cavernous left ICA without significant stenosis through the ICA terminus. The left A1 segment and right A1 segment are similar the prior exam. Proximal right M1 segment narrowing has progressed. Is some irregularity in the left M1 segment without significant stenosis. There is significant loss of perfusion right MCA branch vessels compared to prior exam. Left MCA branch vessels remain intact. ACA vessels are unchanged. Posterior circulation: Moderate narrowing is present at the dural margin of the right vertebral artery, progressive from the prior exam. Left vertebral artery is unchanged. Vertebrobasilar junction normal. The basilar artery is small. Both posterior cerebral arteries originate from the basilar tip. Prominent right posterior communicating artery is present. There is segmental attenuation PCA branch vessels similar the prior study. Venous sinuses: Dural sinuses are patent. Straight sinus and deep cerebral veins are intact. Anatomic variants: None Review of the MIP images confirms the above findings CT Brain Perfusion Findings: ASPECTS: 10/10 CBF (<30%) Volume: 74m Perfusion (Tmax>6.0s) volume: 1655mMismatch Volume: 16270mnfarction Location:Right MCA territory IMPRESSION: 1. 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 2. 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 3. Progressive proximal right M1 segment stenosis with significant loss of perfusion in the right MCA branch vessels. 4. Stable atherosclerotic changes at the left carotid bifurcation without significant stenosis. 5. 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 Basilar artery is similar the prior  exam. 6.  Aortic Atherosclerosis (ICD10-I70.0). Electronically Signed   By: ChrSan MorelleD.   On: 09/11/2020 20:25     Discharge Exam: Vitals:   09/16/20 1200 09/16/20 1300  BP: 133/68 (!) 157/65  Pulse:  73  Resp: 17 17  Temp: 98.3 F (36.8 C)   SpO2: 97% 92%   Vitals:   09/16/20 1000 09/16/20 1100 09/16/20 1200 09/16/20 1300  BP: (!) 108/54 128/62 133/68 (!) 157/65  Pulse: (!) 58   73  Resp: _0 Temp:   98.3 F (36.8 C)   TempSrc:   Oral   SpO2: 96%  97% 92%  Weight:      Height:        General: Pt is alert, awake, not in acute distress Cardiovascular: RRR, S1/S2 +, no rubs, no gallops Respiratory: CTA bilaterally, no wheezing, no rhonchi Abdominal: Soft, NT, ND, bowel sounds + Extremities: no edema, no cyanosis Neuro: Slight weakness on the left upper and lower extremity.  Cranial nerves intact.   The results of significant diagnostics from this hospitalization (including imaging, microbiology, ancillary and laboratory) are listed below for reference.     Microbiology: Recent Results (from the past 240 hour(s))  Resp Panel by RT-PCR (Flu A&B, Covid) Nasopharyngeal Swab  Status: None   Collection Time: 09/10/20  4:27 PM   Specimen: Nasopharyngeal Swab; Nasopharyngeal(NP) swabs in vial transport medium  Result Value Ref Range Status   SARS Coronavirus 2 by RT PCR NEGATIVE NEGATIVE Final    Comment: (NOTE) SARS-CoV-2 target nucleic acids are NOT DETECTED.  The SARS-CoV-2 RNA is generally detectable in upper respiratory specimens during the acute phase of infection. The lowest concentration of SARS-CoV-2 viral copies this assay can detect is 138 copies/mL. A negative result does not preclude SARS-Cov-2 infection and should not be used as the sole basis for treatment or other patient management decisions. A negative result may occur with  improper specimen collection/handling, submission of specimen other than nasopharyngeal swab, presence of  viral mutation(s) within the areas targeted by this assay, and inadequate number of viral copies(<138 copies/mL). A negative result must be combined with clinical observations, patient history, and epidemiological information. The expected result is Negative.  Fact Sheet for Patients:  EntrepreneurPulse.com.au  Fact Sheet for Healthcare Providers:  IncredibleEmployment.be  This test is no t yet approved or cleared by the [place] FDA and  has been authorized for detection and/or diagnosis of SARS-CoV-2 by FDA under an Emergency Use Authorization (EUA). This EUA will remain  in effect (meaning this test can be used) for the duration of the COVID-19 declaration under Section 564(b)(1) of the Act, 21 U.S.C.section 360bbb-3(b)(1), unless the authorization is terminated  or revoked sooner.       Influenza A by PCR NEGATIVE NEGATIVE Final   Influenza B by PCR NEGATIVE NEGATIVE Final    Comment: (NOTE) The Xpert Xpress SARS-CoV-2/FLU/RSV plus assay is intended as an aid in the diagnosis of influenza from Nasopharyngeal swab specimens and should not be used as a sole basis for treatment. Nasal washings and aspirates are unacceptable for Xpert Xpress SARS-CoV-2/FLU/RSV testing.  Fact Sheet for Patients: EntrepreneurPulse.com.au  Fact Sheet for Healthcare Providers: IncredibleEmployment.be  This test is not yet approved or cleared by the [place] FDA and has been authorized for detection and/or diagnosis of SARS-CoV-2 by FDA under an Emergency Use Authorization (EUA). This EUA will remain in effect (meaning this test can be used) for the duration of the COVID-19 declaration under Section 564(b)(1) of the Act, 21 U.S.C. section 360bbb-3(b)(1), unless the authorization is terminated or revoked.  Performed at [facility], [place] [address], [place], [place] [address]      Labs: BNP  (last 3 results) No results for input(s): BNP in the last 8760 hours. Basic Metabolic Panel: Recent Labs  Lab 09/10/20 1540 09/10/20 1559 09/13/20 0312 09/14/20 0407 09/15/20 0410 09/16/20 0943  NA 141 143 138 138 138 136  K 3.3* 3.7 2.8* 3.7 3.1* 3.4*  CL 106 105 103 108 105 105  CO2 26  --  _0 GLUCOSE 88 84 87 79 88 153*  BUN _1 7*  CREATININE 0.71 0.90 0.77 0.71 0.64 0.84  CALCIUM 8.7*  --  8.8* 8.7* 8.9 8.8*  MG  --   --  1.9  --  1.7 1.9   Liver Function Tests: Recent Labs  Lab 09/10/20 1540 09/13/20 0312 09/14/20 0407 09/15/20 0410  AST _2 14*  ALT _3 ALKPHOS 67 60 50 62  BILITOT 0.4 0.2* 0.4 0.6  PROT 7.1 6.6 5.8* 6.2*  ALBUMIN 3.6 3.2* 2.9* 3.0*   No results for input(s): LIPASE, AMYLASE in the last 168 hours. No  results for input(s): AMMONIA in the last 168 hours. CBC: Recent Labs  Lab 09/10/20 1540 09/10/20 1559 09/12/20 0025 09/13/20 0312 09/14/20 0407 09/15/20 0410  WBC 4.4  --  4.3 6.9 6.0 5.7  NEUTROABS 1.9  --   --   --   --   --   HGB 12.2 12.6 12.0 11.0* 9.9* 10.3*  HCT 38.3 37.0 37.4 33.4* 30.8* 31.5*  MCV 90.5  --  89.9 90.3 91.1 88.5  PLT 141*  --  156 145* 123* 147*   Cardiac Enzymes: Recent Labs  Lab 09/12/20 0025  CKTOTAL 78   BNP: Invalid input(s): POCBNP CBG: Recent Labs  Lab 09/10/20 1544 09/11/20 1931  GLUCAP 87 107*   D-Dimer No results for input(s): DDIMER in the last 72 hours. Hgb A1c No results for input(s): HGBA1C in the last 72 hours. Lipid Profile No results for input(s): CHOL, HDL, LDLCALC, TRIG, CHOLHDL, LDLDIRECT in the last 72 hours. Thyroid function studies No results for input(s): TSH, T4TOTAL, T3FREE, THYROIDAB in the last 72 hours.  Invalid input(s): FREET3 Anemia work up No results for input(s): VITAMINB12, FOLATE, FERRITIN, TIBC, IRON, RETICCTPCT in the last 72 hours. Urinalysis    Component Value Date/Time   COLORURINE YELLOW 04/13/2018 2036   APPEARANCEUR  CLEAR 04/13/2018 2036   LABSPEC 1.028 04/13/2018 2036   PHURINE 5.0 04/13/2018 2036   GLUCOSEU NEGATIVE 04/13/2018 2036   HGBUR SMALL (A) 04/13/2018 2036   BILIRUBINUR NEGATIVE 04/13/2018 2036   KETONESUR NEGATIVE 04/13/2018 2036   PROTEINUR 100 (A) 04/13/2018 2036   UROBILINOGEN 1.0 11/15/2010 2015   NITRITE NEGATIVE 04/13/2018 2036   LEUKOCYTESUR NEGATIVE 04/13/2018 2036   Sepsis Labs Invalid input(s): PROCALCITONIN,  WBC,  LACTICIDVEN Microbiology Recent Results (from the past 240 hour(s))  Resp Panel by RT-PCR (Flu A&B, Covid) Nasopharyngeal Swab     Status: None   Collection Time: 09/10/20  4:27 PM   Specimen: Nasopharyngeal Swab; Nasopharyngeal(NP) swabs in vial transport medium  Result Value Ref Range Status   SARS Coronavirus 2 by RT PCR NEGATIVE NEGATIVE Final    Comment: (NOTE) SARS-CoV-2 target nucleic acids are NOT DETECTED.  The SARS-CoV-2 RNA is generally detectable in upper respiratory specimens during the acute phase of infection. The lowest concentration of SARS-CoV-2 viral copies this assay can detect is 138 copies/mL. A negative result does not preclude SARS-Cov-2 infection and should not be used as the sole basis for treatment or other patient management decisions. A negative result may occur with  improper specimen collection/handling, submission of specimen other than nasopharyngeal swab, presence of viral mutation(s) within the areas targeted by this assay, and inadequate number of viral copies(<138 copies/mL). A negative result must be combined with clinical observations, patient history, and epidemiological information. The expected result is Negative.  Fact Sheet for Patients:  EntrepreneurPulse.com.au  Fact Sheet for Healthcare Providers:  IncredibleEmployment.be  This test is no t yet approved or cleared by the [place] FDA and  has been authorized for detection and/or diagnosis of SARS-CoV-2 by FDA under  an Emergency Use Authorization (EUA). This EUA will remain  in effect (meaning this test can be used) for the duration of the COVID-19 declaration under Section 564(b)(1) of the Act, 21 U.S.C.section 360bbb-3(b)(1), unless the authorization is terminated  or revoked sooner.       Influenza A by PCR NEGATIVE NEGATIVE Final   Influenza B by PCR NEGATIVE NEGATIVE Final    Comment: (NOTE) The Xpert Xpress SARS-CoV-2/FLU/RSV plus assay is intended as  an aid in the diagnosis of influenza from Nasopharyngeal swab specimens and should not be used as a sole basis for treatment. Nasal washings and aspirates are unacceptable for Xpert Xpress SARS-CoV-2/FLU/RSV testing.  Fact Sheet for Patients: EntrepreneurPulse.com.au  Fact Sheet for Healthcare Providers: IncredibleEmployment.be  This test is not yet approved or cleared by the [place] FDA and has been authorized for detection and/or diagnosis of SARS-CoV-2 by FDA under an Emergency Use Authorization (EUA). This EUA will remain in effect (meaning this test can be used) for the duration of the COVID-19 declaration under Section 564(b)(1) of the Act, 21 U.S.C. section 360bbb-3(b)(1), unless the authorization is terminated or revoked.  Performed at [facility], [place] [address], [place], [place] [address]      Time coordinating discharge: Over 30 minutes  SIGNED:   Darliss Cheney, MD  [facility] 09/16/2020, 1:22 PM  If 7PM-7AM, please contact night-coverage [email or web address]

## 2020-09-16 NOTE — Progress Notes (Signed)
Inpatient Rehab Admissions Coordinator:   I have a bed available for pt to admit to CIR today.  Dr. Jacqulyn Bath and Dr. Pearlean Brownie in agreement. Chase Picket, LCSW, aware.  Will let pt/family know.   Estill Dooms, PT, DPT Admissions Coordinator [phone] 09/16/20  11:28 AM

## 2020-09-16 NOTE — Progress Notes (Signed)
Patient arrived and oriented to unit routines. Patient alert and oriented Resting comfortably with call bell in place.

## 2020-09-16 NOTE — H&P (Signed)
Physical Medicine and Rehabilitation Admission H&P    Chief Complaint  Patient presents with  . Code Stroke  : HPI: [name] is a 75 year old right-handed female with history of COPD, DVT during pregnancy hypertension, thoracicoabdominal aortic aneurysm, quit smoking 33 years ago, question medical noncompliance.  History taken from chart review and patient. Patient lives with her family.  Independent prior to admission.  Two-level home bed and bath upstairs.  Independent ADLs.  She does not drive.  Presented on 09/10/2020 with dizziness and unsteady gait that persisted over 2-3 weeks as well as intermittent left-sided weakness.  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  No evidence of hemorrhage.  CT angiogram of head and neck age-indeterminate occlusion of the right cervical ICA just beyond its origin.  At least partial reconstitution at the clinoid intracranially.  Less than 50% stenosis of the left ICA origin.  Admission chemistries unremarkable except potassium 3.3. Patient underwent right ICA mechanical thrombectomy revascularization per interventional radiology 09/12/2020.  Currently maintained on DAPT aspirin 81 mg daily and Brilinta for CVA prophylaxis x3 months.  Cleviprex initially added for blood pressure control.  Patient developed increasing right hand pain and numbness follow-up CT of the head 09/15/2020 showed interval development of scattered SAH involving the right sylvian fissure and overlying right frontal cortical sulci.  Small volume intraventricular hemorrhage seen as well likely related to redistribution.  No hydrocephalus or ventricular trapping.  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  Due to patient's left-sided  hemiparesis decreased functional mobility was admitted for a comprehensive rehab program. Please see preadmission assessment from earlier today as well.   Review of Systems  Constitutional: Negative for chills, fever and malaise/fatigue.  HENT: Negative for hearing loss.   Eyes: Negative for blurred vision and double vision.  Respiratory: Negative for cough.        Occasional shortness of breath with exertion  Cardiovascular: Negative for chest pain, palpitations and leg swelling.  Gastrointestinal: Positive for constipation. Negative for heartburn, nausea and vomiting.  Genitourinary: Negative for dysuria, flank pain and hematuria.  Musculoskeletal: Positive for joint pain and myalgias.  Skin: Negative for rash.  Neurological: Positive for dizziness and headaches. Negative for weakness.  All other systems reviewed and are negative.  Past Medical History:  Diagnosis Date  . COPD (chronic obstructive pulmonary disease) (HCC)   . History of DVT (deep vein thrombosis)   . Hypertension   . Thoracoabdominal aortic aneurysm [facility])    Past Surgical History:  Procedure Laterality Date  . IR CT HEAD LTD  09/11/2020  . IR INTRAVSC STENT CERV CAROTID W/O EMB-PROT MOD SED INC ANGIO  09/11/2020  . IR PERCUTANEOUS ART THROMBECTOMY/INFUSION INTRACRANIAL INC DIAG ANGIO  09/11/2020      . IR PERCUTANEOUS ART THROMBECTOMY/INFUSION INTRACRANIAL INC DIAG ANGIO  09/11/2020  . IR US GUIDE VASC ACCESS RIGHT  09/11/2020  . NO PAST SURGERIES    . RADIOLOGY WITH ANESTHESIA N/A 09/11/2020   Procedure: IR WITH ANESTHESIA;  Surgeon: Julieanne Cotton, MD;  Location: [facility] OR;  Service: Radiology;  Laterality: N/A;   No pertinent family history of premature CVA. Social History:  reports that she quit smoking about 33 years ago. She has never used smokeless tobacco. She reports that she does not drink alcohol and  does not use drugs. Allergies: No Known Allergies Medications Prior to Admission  Medication Sig Dispense  Refill  . ibuprofen (ADVIL) 200 MG tablet Take 200 mg by mouth every 6 (six) hours as needed for headache.      Drug Regimen Review Drug regimen was reviewed and remains appropriate with no significant issues identified  Home: Home Living Family/patient expects to be discharged to:: Private residence Living Arrangements: Children Available Help at Discharge: Family,Available 24 hours/day Type of Home: Apartment Home Access: Stairs to enter Entrance Stairs-Number of Steps: 2 Entrance Stairs-Rails: None Home Layout: Two level,Bed/bath upstairs Alternate Level Stairs-Number of Steps: flight Alternate Level Stairs-Rails: Left Bathroom Shower/Tub: Engineer, manufacturing systems: Standard Home Equipment: None  Lives With: Daughter   Functional History: Prior Function Level of Independence: Independent Comments: Independent with ADLs/IADLs. Does not drive at baseline.  Functional Status:  Mobility: Bed Mobility Overal bed mobility: Needs Assistance Bed Mobility: Supine to Sit Supine to sit: Min assist General bed mobility comments: MinA for trunk assist to upright Transfers Overall transfer level: Needs assistance Equipment used: Rolling walker (2 wheeled) Transfers: Sit to/from Stand Sit to Stand: Mod assist,Min assist  Lateral/Scoot Transfers: Min guard General transfer comment: Pt requiring modA to rise from edge of bed, minA to stand from toilet height Ambulation/Gait Ambulation/Gait assistance: Min assist Gait Distance (Feet): 120 Feet Assistive device: Rolling walker (2 wheeled) Gait Pattern/deviations: Decreased stride length,Decreased dorsiflexion - left,Shuffle,Drifts right/left,Trunk flexed,Step-through pattern General Gait Details: Cues for increased left foot clearance, neutral positioning, walker proximity, maintaining a straight path. Pt requiring minA for balance and walker negotiation, tends to run into objects on left. Tendency for downward gaze Gait  velocity: 0.3 m/s Gait velocity interpretation: <1.31 ft/sec, indicative of household ambulator    ADL: ADL Overall ADL's : Needs assistance/impaired Eating/Feeding: Set up,Sitting Grooming: Min guard,Standing,Wash/dry hands,Wash/dry face Upper Body Bathing: Minimal assistance,Sitting Lower Body Bathing: Maximal assistance,+2 for physical assistance,+2 for safety/equipment,Cueing for safety,Cueing for sequencing,Sitting/lateral leans,Sit to/from stand Upper Body Dressing : Minimal assistance,Sitting Lower Body Dressing: Maximal assistance,+2 for physical assistance,+2 for safety/equipment,Cueing for safety Lower Body Dressing Details (indicate cue type and reason): assist for socks Toilet Transfer: Minimal assistance,+2 for physical assistance,+2 for safety/equipment,Ambulation,Grab bars,RW Toilet Transfer Details (indicate cue type and reason): cues for L side peripheral field deficits Toileting- Clothing Manipulation and Hygiene: Minimal assistance,+2 for physical assistance,+2 for safety/equipment,Sitting/lateral lean,Sit to/from stand Toileting - Clothing Manipulation Details (indicate cue type and reason): Pt standing with RUE holding onto grab bar and LUE performing pericare. Functional mobility during ADLs: Minimal assistance,+2 for physical assistance,+2 for safety/equipment,Cueing for safety,Cueing for sequencing,Rolling walker,Moderate assistance (ModA iniitally for stability and minA +2 thereafter) General ADL Comments: Patient limited by L-sided weakness, L peripheral visual deficits and decreased awareness of deficits with maximal cues to look to L.  Cognition: Cognition Overall Cognitive Status: Impaired/Different from baseline Arousal/Alertness: Awake/alert Orientation Level: Oriented X4 Attention: Sustained Sustained Attention: Appears intact Memory: Impaired Memory Impairment: Retrieval deficit Awareness: Impaired Awareness Impairment: Anticipatory impairment Problem  Solving: Impaired Problem Solving Impairment: Verbal complex Safety/Judgment: Other (comment) (functional for basic safety) Cognition Arousal/Alertness: Awake/alert Behavior During Therapy: Flat affect Overall Cognitive Status: Impaired/Different from baseline Area of Impairment: Attention,Safety/judgement,Awareness,Problem solving Current Attention Level: Sustained Memory: Decreased short-term memory,Decreased recall of precautions Following Commands: Follows one step commands with increased time Safety/Judgement: Decreased awareness of deficits,Decreased awareness of safety Awareness: Emergent Problem Solving: Requires verbal cues,Requires tactile cues,Slow processing,Decreased initiation General Comments: Pt received asleep, minimal interaction with therapist throughout session, following 1  step commands consistently. Decreased left sided awareness  Physical Exam: Blood pressure (!) 122/98, pulse (!) 56, temperature 98.2 F (36.8 C), temperature source Axillary, resp. rate 16, height 5\' 10"  (1.778 m), weight 95.3 kg, SpO2 96 %. Physical Exam Vitals reviewed.  Constitutional:      General: She is not in acute distress.    Appearance: She is obese.  HENT:     Head: Normocephalic and atraumatic.     Comments: Poor dentition    Right Ear: External ear normal.     Left Ear: External ear normal.     Nose: Nose normal.  Eyes:     General:        Right eye: No discharge.        Left eye: No discharge.     Extraocular Movements: Extraocular movements intact.  Cardiovascular:     Rate and Rhythm: Normal rate and regular rhythm.  Pulmonary:     Effort: Pulmonary effort is normal. No respiratory distress.  Abdominal:     General: Abdomen is flat. Bowel sounds are normal. There is no distension.  Musculoskeletal:     Cervical back: Normal range of motion and neck supple.     Comments: No edema or tenderness in extremities  Skin:    General: Skin is warm and dry.  Neurological:      Mental Status: She is alert and oriented to person, place, and time.     Comments: Alert Makes eye contact with examiner.   Follows commands.   Senstion intact to light touch Motor: RUE/RLE: 5/5 proximal to distal LUE/LLE: 4-4+/5 proximal to distal  Psychiatric:        Speech: Speech is delayed.        Behavior: Behavior is slowed.     Results for orders placed or performed during the hospital encounter of 09/10/20 (from the past 48 hour(s))  Comprehensive metabolic panel     Status: Abnormal   Collection Time: 09/15/20  4:10 AM  Result Value Ref Range   Sodium 138 135 - 145 mmol/L   Potassium 3.1 (L) 3.5 - 5.1 mmol/L   Chloride 105 98 - 111 mmol/L   CO2 25 22 - 32 mmol/L   Glucose, Bld 88 70 - 99 mg/dL    Comment: Glucose reference range applies only to samples taken after fasting for at least 8 hours.   BUN 11 8 - 23 mg/dL   Creatinine, Ser 11/15/20 0.44 - 1.00 mg/dL   Calcium 8.9 8.9 - 8.13 mg/dL   Total Protein 6.2 (L) 6.5 - 8.1 g/dL   Albumin 3.0 (L) 3.5 - 5.0 g/dL   AST 14 (L) 15 - 41 U/L   ALT 9 0 - 44 U/L   Alkaline Phosphatase 62 38 - 126 U/L   Total Bilirubin 0.6 0.3 - 1.2 mg/dL   GFR, Estimated 88.7 >19 mL/min    Comment: (NOTE) Calculated using the CKD-EPI Creatinine Equation (2021)    Anion gap 8 5 - 15    Comment: Performed at [facility], [address] [place], [place]  Magnesium     Status: None   Collection Time: 09/15/20  4:10 AM  Result Value Ref Range   Magnesium 1.7 1.7 - 2.4 mg/dL    Comment: Performed at [facility], [address], [place], [place]  CBC     Status: Abnormal   Collection Time: 09/15/20  4:10 AM  Result Value Ref Range  WBC 5.7 4.0 - 10.5 K/uL   RBC 3.56 (L) 3.87 - 5.11 MIL/uL   Hemoglobin 10.3 (L) 12.0 - 15.0 g/dL   HCT 38.7 (L) 56.4 - 33.2 %   MCV 88.5 80.0 - 100.0 fL   MCH 28.9 26.0 - 34.0 pg   MCHC 32.7 30.0 - 36.0 g/dL   RDW 95.1 88.4 - 16.6 %   Platelets 147 (L) 150 - 400 K/uL   nRBC  0.0 0.0 - 0.2 %    Comment: Performed at [facility], [address], [place], [place] [address]   CT HEAD WO CONTRAST  Result Date: 09/15/2020 CLINICAL DATA:  Initial evaluation for new onset right arm weakness, history of recent stroke. EXAM: CT HEAD WITHOUT CONTRAST TECHNIQUE: Contiguous axial images were obtained from the base of the skull through the vertex without intravenous contrast. COMPARISON:  Prior studies from 09/11/2020. FINDINGS: Brain: Patchy hypodensities involving the right basal ganglia and posterior right parieto-occipital region, consistent with evolving acute to subacute right MCA distribution infarcts. Since previous exam, there has been interval development of scattered subarachnoid hemorrhage involving the right sylvian fissure and overlying right frontal cortical sulci. Small volume intraventricular hemorrhage seen as well, with blood seen layering within the occipital horns of both lateral ventricles, likely related to redistribution. No hydrocephalus or ventricular trapping. No significant mass effect or midline shift. Basilar cisterns remain patent. No other definite acute large vessel territory infarct. No mass lesion or extra-axial fluid collection. Underlying atrophy with chronic microvascular ischemic disease again noted. Vascular: No visible hyperdense vessel. Skull: Scalp soft tissues and calvarium within normal limits. Sinuses/Orbits: Globes and orbital soft tissues within normal limits. Moderate mucosal thickening in opacity seen throughout the paranasal sinuses, with air-fluid levels present within both maxillary sinuses. Small right mastoid effusion noted. Other: None. IMPRESSION: 1. Interval development of scattered subarachnoid hemorrhage involving the right Sylvian fissure and overlying right frontal cortical sulci. Small volume intraventricular hemorrhage seen as well, likely related to redistribution. No hydrocephalus or ventricular trapping. 2. Evolving acute  to subacute right MCA distribution infarcts. No significant mass effect or midline shift. 3. No other new acute intracranial abnormality. 4. Underlying atrophy with chronic microvascular ischemic disease. Critical Value/emergent results were called by telephone at the time of interpretation on 09/15/2020 at 2:18 am to provider [facility] , who verbally acknowledged these results. Electronically Signed   By: Rise Mu M.D.   On: 09/15/2020 02:33   Medical Problem List and Plan: 1.  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  Status post right ICA revascularization  -patient may shower  -ELOS/Goals: 8-12 days/Supervision/Mod I  Admit to CIR 2.  Antithrombotics: -DVT/anticoagulation: SCDs  -antiplatelet therapy: Aspirin 81 mg daily, Brilinta 60 mg twice daily 3. Pain Management: Tylenol as needed 4. Mood: Provide emotional support  -antipsychotic agents: N/A 5. Neuropsych: This patient is capable of making decisions on her own behalf. 6. Skin/Wound Care: Routine skin checks 7. Fluids/Electrolytes/Nutrition: Routine in and outs  CMP ordered for tomorrow 8.  Hypertension.  Norvasc 2.5 mg daily.    Monitor with increased mobility 9.  Hyperlipidemia: Lipitor 10.  History of COPD with remote tobacco use.    O2 Sats every shift  Questionable medical noncompliance.  Provide counseling   Charlton Amor, PA-C 09/16/2020  I have personally performed a face to face diagnostic evaluation, including,  but not limited to relevant history and physical exam findings, of this patient and developed relevant assessment and plan.  Additionally, I have reviewed and concur with the physician assistant's documentation above.  Delice Lesch, MD, ABPMR

## 2020-09-16 NOTE — Progress Notes (Signed)
Physical Therapy Treatment Patient Details Name: [name] MRN: [id number] DOB: Sep 17, 1945 Today's Date: 09/16/2020    History of Present Illness 75 yo female presents [facility] on 5/5 with 2-3 week history of dizziness, unsteadiness. 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 Rapid response called 5/6 due to new onset L sided weakness, pt taken to IR for thrombectomy of R ICA/MCA occlusions. Repeat CT 5/10 showing asymptomatic right sylvian fissure and frontal convexity SAH. PMH significant for hypertension, COPD, aortic aneurysm, DVT in pregnancy, former smoking, nonadherence to medications for the past year, remote MI.    PT Comments    Pt maintaining level of functional mobility. Session focused on continued transfer/gait training and promoting left sided awareness. Pt requiring min assist for functional mobility. Ambulating x 120 feet with a walker, requires consistent assist for maintaining walker on a straight path with tendency for drift and running into objects on the left. Instructed pt to locate signage/pictures on the left wall to increase visual scanning, but pt has to completely stop in order to find. Presents as a high fall risk based on decreased gait speed and safety awareness. Continue to recommend comprehensive inpatient rehab (CIR) for post-acute therapy needs.   Follow Up Recommendations  CIR     Equipment Recommendations  None recommended by PT    Recommendations for Other Services       Precautions / Restrictions Precautions Precautions: Fall Precaution Comments: L inattention Restrictions Weight Bearing Restrictions: No    Mobility  Bed Mobility Overal bed mobility: Needs Assistance Bed Mobility: Supine to Sit     Supine to sit: Min assist      General bed mobility comments: Cues for sequencing, exiting towards left side of the bed, minA for trunk to execute upright positioning    Transfers Overall transfer level: Needs assistance Equipment used: Rolling walker (2 wheeled) Transfers: Sit to/from Stand Sit to Stand: Min assist         General transfer comment: MinA to rise from edge of bed and toilet, cues for hand placement and use of grab bars  Ambulation/Gait Ambulation/Gait assistance: Min assist Gait Distance (Feet): 120 Feet Assistive device: Rolling walker (2 wheeled) Gait Pattern/deviations: Decreased stride length;Decreased dorsiflexion - left;Shuffle;Drifts right/left;Trunk flexed;Step-through pattern Gait velocity: decreased   General Gait Details: Cues provided for neutral left foot positioning, walker proximity, negotiating obstacles. Pt requires minA for balance and maintaining walker on a straight path. Tendency for left drift and consistently running into several objects on the left. Cues provided for visual scanning, however, pt does not perform.   Stairs             Wheelchair Mobility    Modified Rankin (Stroke Patients Only) Modified Rankin (Stroke Patients Only) Pre-Morbid Rankin Score: No symptoms Modified Rankin: Moderately severe disability     Balance Overall balance assessment: Needs assistance Sitting-balance support: Feet supported Sitting balance-Leahy Scale: Fair     Standing balance support: Bilateral upper extremity supported Standing balance-Leahy Scale: Poor Standing balance comment: reliant on RW                            Cognition Arousal/Alertness: Awake/alert Behavior During Therapy: Flat affect Overall Cognitive Status: Impaired/Different from baseline Area of Impairment: Attention;Safety/judgement;Awareness;Problem solving  Current Attention Level: Sustained Memory: Decreased short-term memory;Decreased recall of  precautions Following Commands: Follows one step commands with increased time Safety/Judgement: Decreased awareness of deficits;Decreased awareness of safety Awareness: Emergent Problem Solving: Requires verbal cues;Requires tactile cues;Slow processing;Decreased initiation General Comments: Pt received asleep, minimal interaction with therapist throughout session, following 1 step commands consistently. Decreased left sided awareness      Exercises      General Comments        Pertinent Vitals/Pain Pain Assessment: No/denies pain    Home Living                      Prior Function            PT Goals (current goals can now be found in the care plan section) Acute Rehab PT Goals Patient Stated Goal: did not state PT Goal Formulation: With patient Time For Goal Achievement: 09/26/20 Potential to Achieve Goals: Good Progress towards PT goals: Progressing toward goals    Frequency    Min 4X/week      PT Plan Current plan remains appropriate    Co-evaluation              AM-PAC PT "6 Clicks" Mobility   Outcome Measure  Help needed turning from your back to your side while in a flat bed without using bedrails?: A Little Help needed moving from lying on your back to sitting on the side of a flat bed without using bedrails?: A Little Help needed moving to and from a bed to a chair (including a wheelchair)?: A Little Help needed standing up from a chair using your arms (e.g., wheelchair or bedside chair)?: A Little Help needed to walk in hospital room?: A Little Help needed climbing 3-5 steps with a railing? : A Lot 6 Click Score: 17    End of Session Equipment Utilized During Treatment: Gait belt Activity Tolerance: Patient tolerated treatment well;Patient limited by fatigue Patient left: in chair;with call bell/phone within reach;with chair alarm set;with family/visitor present Nurse Communication: Mobility status PT Visit Diagnosis: Other  abnormalities of gait and mobility (R26.89);Difficulty in walking, not elsewhere classified (R26.2);Other symptoms and signs involving the nervous system (R29.898)     Time: 2952-8413 PT Time Calculation (min) (ACUTE ONLY): 28 min  Charges:  $Gait Training: 8-22 mins $Therapeutic Activity: 8-22 mins                     Lillia Pauls, PT, DPT Acute Rehabilitation Services Pager [phone] Office [phone]    Norval Morton 09/16/2020, 9:49 AM

## 2020-09-16 NOTE — Progress Notes (Addendum)
Referring Physician(s): Code stroke- Milon DikesArora, Ashish (neurology)  Supervising Physician: Baldemar Lenise Macedo Rodrigues, [place]  [facility]  [facility]  Chief Complaint:  History of acute CVA s/p cerebral arteriogram with emergent mechanical thrombectomy of cervical right ICA and distal right MCA M1 occlusions achieving a TICI 3 revascularization, along with revascularization of right carotid bifurcation using stent assisted angioplasty via right femoral approach 09/11/2020 by Dr. Tommie Samsde Macedo Rodrigues; with follow-up CT head 09/15/2020 revealing scattered SAH (involving the right Sylvian fissure and overlying right frontal cortical sulci) with small volume IVH (likely related to redistribution).  Subjective:  Patient awake and alert sitting in chair eating breakfast with no complaints at this time. Just finished working with PT- states she stood up/got to chair without difficulty. Moving all extremities. No pronator drift.  CT head 09/15/2020: 1. Interval development of scattered subarachnoid hemorrhage involving the right Sylvian fissure and overlying right frontal cortical sulci. Small volume intraventricular hemorrhage seen as well, likely related to redistribution. No hydrocephalus or ventricular trapping. 2. Evolving acute to subacute right MCA distribution infarcts. No significant mass effect or midline shift. 3. No other new acute intracranial abnormality. 4. Underlying atrophy with chronic microvascular ischemic disease.   Allergies: Patient has no known allergies.  Medications: Prior to Admission medications   Medication Sig Start Date End Date Taking? Authorizing Provider  ibuprofen (ADVIL) 200 MG tablet Take 200 mg by mouth every 6 (six) hours as needed for headache.   Yes [provider]     Vital Signs: BP 132/61   Pulse (!) 56   Temp 98.2 F (36.8 C) (Axillary)   Resp 17   Ht 5\' 10"  (1.778 m)   Wt 210 lb (95.3 kg)   SpO2 95%   BMI 30.13 kg/m   Physical  Exam Vitals and nursing note reviewed.  Constitutional:      General: She is not in acute distress.    Appearance: She is obese.  Cardiovascular:     Rate and Rhythm: Normal rate.  Pulmonary:     Effort: Pulmonary effort is normal. No respiratory distress.  Skin:    General: Skin is warm and dry.  Neurological:     Mental Status: She is alert.     Comments: Alert, awake, and oriented x3. Speech and comprehension intact. PERRL bilaterally. EOMs intact bilaterally without nystagmus or subjective diplopia. No facial asymmetry. Tongue midline. Can spontaneously move all extremities. No pronator drift.     Imaging: CT HEAD WO CONTRAST  Result Date: 09/15/2020 CLINICAL DATA:  Initial evaluation for new onset right arm weakness, history of recent stroke. EXAM: CT HEAD WITHOUT CONTRAST TECHNIQUE: Contiguous axial images were obtained from the base of the skull through the vertex without intravenous contrast. COMPARISON:  Prior studies from 09/11/2020. FINDINGS: Brain: Patchy hypodensities involving the right basal ganglia and posterior right parieto-occipital region, consistent with evolving acute to subacute right MCA distribution infarcts. Since previous exam, there has been interval development of scattered subarachnoid hemorrhage involving the right sylvian fissure and overlying right frontal cortical sulci. Small volume intraventricular hemorrhage seen as well, with blood seen layering within the occipital horns of both lateral ventricles, likely related to redistribution. No hydrocephalus or ventricular trapping. No significant mass effect or midline shift. Basilar cisterns remain patent. No other definite acute large vessel territory infarct. No mass lesion or extra-axial fluid collection. Underlying atrophy with chronic microvascular ischemic disease again noted. Vascular: No visible hyperdense vessel. Skull: Scalp soft tissues and calvarium within normal  limits. Sinuses/Orbits: Globes and  orbital soft tissues within normal limits. Moderate mucosal thickening in opacity seen throughout the paranasal sinuses, with air-fluid levels present within both maxillary sinuses. Small right mastoid effusion noted. Other: None. IMPRESSION: 1. Interval development of scattered subarachnoid hemorrhage involving the right Sylvian fissure and overlying right frontal cortical sulci. Small volume intraventricular hemorrhage seen as well, likely related to redistribution. No hydrocephalus or ventricular trapping. 2. Evolving acute to subacute right MCA distribution infarcts. No significant mass effect or midline shift. 3. No other new acute intracranial abnormality. 4. Underlying atrophy with chronic microvascular ischemic disease. Critical Value/emergent results were called by telephone at the time of interpretation on 09/15/2020 at 2:18 am to provider [facility] , who verbally acknowledged these results. Electronically Signed   By: Rise Mu M.D.   On: 09/15/2020 02:33   ECHOCARDIOGRAM COMPLETE  Result Date: 09/12/2020    ECHOCARDIOGRAM REPORT   Patient Name:   [name] Date of Exam: 09/12/2020 Medical Rec #:  [id number]   Height:       70.0 in Accession #:    [id number]  Weight:       210.0 lb Date of Birth:  [birth year]  BSA:          2.131 m Patient Age:    74 years    BP:           99/45 mmHg Patient Gender: F           HR:           36 bpm. Exam Location:  Inpatient Procedure: 2D Echo, Cardiac Doppler and Color Doppler Indications:    TIA  History:        Patient has no prior history of Echocardiogram examinations.                 CHF, COPD; Risk Factors:Hypertension and Diabetes.  Sonographer:    Neomia Dear RDCS Referring Phys: [id number] PARDEEP [id number]  Sonographer Comments: No cardiac surgery noted in chart. IMPRESSIONS  1. Left ventricular ejection fraction, by estimation, is 60 to 65%. The left ventricle has normal function. The left ventricle has no regional wall motion abnormalities.  There is mild left ventricular hypertrophy. Left ventricular diastolic parameters are consistent with Grade I diastolic dysfunction (impaired relaxation).  2. Right ventricular systolic function is normal. The right ventricular size is normal. Tricuspid regurgitation signal is inadequate for assessing PA pressure.  3. Left atrial size was moderately dilated.  4. Right atrial size was mildly dilated.  5. The mitral valve is normal in structure. Trivial mitral valve regurgitation. No evidence of mitral stenosis.  6. The aortic valve is grossly normal. There is mild calcification of the aortic valve. Aortic valve regurgitation is not visualized. No aortic stenosis is present.  7. Aortic dilatation noted. There is borderline dilatation of the ascending aorta, measuring 39 mm.  8. The inferior vena cava is normal in size with greater than 50% respiratory variability, suggesting right atrial pressure of 3 mmHg. Conclusion(s)/Recommendation(s): No intracardiac source of embolism detected on this transthoracic study. A transesophageal echocardiogram is recommended to exclude cardiac source of embolism if clinically indicated. FINDINGS  Left Ventricle: Left ventricular ejection fraction, by estimation, is 60 to 65%. The left ventricle has normal function. The left ventricle has no regional wall motion abnormalities. The left ventricular internal cavity size was normal in size. There is  mild left ventricular hypertrophy. Left ventricular diastolic parameters are consistent with Grade I diastolic dysfunction (impaired relaxation).  Right Ventricle: The right ventricular size is normal. No increase in right ventricular wall thickness. Right ventricular systolic function is normal. Tricuspid regurgitation signal is inadequate for assessing PA pressure. Left Atrium: Left atrial size was moderately dilated. Right Atrium: Right atrial size was mildly dilated. Pericardium: There is no evidence of pericardial effusion. Mitral Valve:  The mitral valve is normal in structure. Trivial mitral valve regurgitation. No evidence of mitral valve stenosis. Tricuspid Valve: The tricuspid valve is normal in structure. Tricuspid valve regurgitation is not demonstrated. No evidence of tricuspid stenosis. Aortic Valve: The aortic valve is grossly normal. There is mild calcification of the aortic valve. Aortic valve regurgitation is not visualized. No aortic stenosis is present. Aortic valve mean gradient measures 4.0 mmHg. Aortic valve peak gradient measures 9.5 mmHg. Aortic valve area, by VTI measures 3.12 cm. Pulmonic Valve: The pulmonic valve was normal in structure. Pulmonic valve regurgitation is trivial. No evidence of pulmonic stenosis. Aorta: Aortic dilatation noted. There is borderline dilatation of the ascending aorta, measuring 39 mm. Venous: The inferior vena cava is normal in size with greater than 50% respiratory variability, suggesting right atrial pressure of 3 mmHg. IAS/Shunts: No atrial level shunt detected by color flow Doppler. EKG: Rhythm strip during this exam demostrated sinus bradycardia.  LEFT VENTRICLE PLAX 2D LVIDd:         5.20 cm      Diastology LV PW:         1.20 cm      LV e' medial:    5.11 cm/s LV IVS:        1.30 cm      LV E/e' medial:  15.2 LVOT diam:     2.30 cm      LV e' lateral:   6.57 cm/s LV SV:         115          LV E/e' lateral: 11.9 LV SV Index:   54 LVOT Area:     4.15 cm  LV Volumes (MOD) LV vol d, MOD A2C: 63.5 ml LV vol d, MOD A4C: 130.0 ml LV vol s, MOD A2C: 27.5 ml LV vol s, MOD A4C: 27.9 ml LV SV MOD A2C:     36.0 ml LV SV MOD A4C:     130.0 ml LV SV MOD BP:      68.8 ml RIGHT VENTRICLE RV S prime:     16.30 cm/s  PULMONARY VEINS TAPSE (M-mode): 3.0 cm      A Reversal Duration: 113.00 msec                             A Reversal Velocity: 32.80 cm/s                             Diastolic Velocity:  39.40 cm/s                             S/D Velocity:        1.40                             Systolic  Velocity:   55.00 cm/s LEFT ATRIUM             Index LA diam:        4.30 cm  2.02 cm/m LA Vol (A2C):   95.6 ml 44.86 ml/m LA Vol (A4C):   80.7 ml 37.87 ml/m LA Biplane Vol: 90.0 ml 42.23 ml/m  AORTIC VALVE                   PULMONIC VALVE AV Area (Vmax):    3.26 cm    PV Vmax:       1.03 m/s AV Area (Vmean):   3.10 cm    PV Vmean:      68.500 cm/s AV Area (VTI):     3.12 cm    PV VTI:        0.305 m AV Vmax:           154.00 cm/s PV Peak grad:  4.2 mmHg AV Vmean:          94.300 cm/s PV Mean grad:  2.0 mmHg AV VTI:            0.367 m AV Peak Grad:      9.5 mmHg AV Mean Grad:      4.0 mmHg LVOT Vmax:         121.00 cm/s LVOT Vmean:        70.300 cm/s LVOT VTI:          0.276 m LVOT/AV VTI ratio: 0.75  AORTA Ao Root diam: 3.40 cm Ao Asc diam:  3.90 cm MITRAL VALVE MV Area (PHT): 2.48 cm    SHUNTS MV Decel Time: 306 msec    Systemic VTI:  0.28 m MV E velocity: 77.90 cm/s  Systemic Diam: 2.30 cm MV A velocity: 94.00 cm/s MV E/A ratio:  0.83 Weston Brass MD Electronically signed by Weston Brass MD Signature Date/Time: 09/12/2020/4:17:21 PM    Final     Labs:  CBC: Recent Labs    09/12/20 0025 09/13/20 0312 09/14/20 0407 09/15/20 0410  WBC 4.3 6.9 6.0 5.7  HGB 12.0 11.0* 9.9* 10.3*  HCT 37.4 33.4* 30.8* 31.5*  PLT 156 145* 123* 147*    COAGS: Recent Labs    09/10/20 1540  INR 1.0  APTT 26    BMP: Recent Labs    09/10/20 1540 09/10/20 1559 09/13/20 0312 09/14/20 0407 09/15/20 0410  NA 141 143 138 138 138  K 3.3* 3.7 2.8* 3.7 3.1*  CL 106 105 103 108 105  CO2 26  --  GLUCOSE 88 84 87 79 88  BUN CALCIUM 8.7*  --  8.8* 8.7* 8.9  CREATININE 0.71 0.90 0.77 0.71 0.64  GFRNONAA >60  --  >60 >60 >60    LIVER FUNCTION TESTS: Recent Labs    09/10/20 1540 09/13/20 0312 09/14/20 0407 09/15/20 0410  BILITOT 0.4 0.2* 0.4 0.6  AST 14*  ALT ALKPHOS 67 60 50 62  PROT 7.1 6.6 5.8* 6.2*  ALBUMIN 3.6 3.2* 2.9* 3.0*    Assessment  and Plan:  History of acute CVA s/p cerebral arteriogram with emergent mechanical thrombectomy of cervical right ICA and distal right MCA M1 occlusions achieving a TICI 3 revascularization, along with revascularization of right carotid bifurcation using stent assisted angioplasty via right femoral approach 09/11/2020 by Dr. Tommie Sams; with follow-up CT head 09/15/2020 revealing scattered SAH (involving the right Sylvian fissure and overlying right frontal cortical sulci) with small volume IVH (likely related to redistribution). Awake and alert and follows simple commands, moving all extremities, no pronator drift  or facial asymmetry. Dr. Tommie Sams at bedside- patient instructed to call RN if notices stroke-like symptoms. Currently hold DAPT, STAT P2Y12 pending for today- will determine DAPT regimen following this result. Plan to follow-up with Dr. Tommie Sams with carotid US followed by consult 3 months after discharge ([facility] schedulers to call patient to set up these appointments). Further plans per [facility]/neurology- appreciate and agree with management. IR to follow.   ADDENDUM: P2Y12 177 PRU today. Discussed case with Dr. Tommie Sams who recommends restarting DAPT at this time (Brilinta 60 mg twice daily, Aspirin 81 mg once daily) and recheck P2Y12 in AM. Orders updated, Dr. Jacqulyn Bath made aware. IR to follow.   Electronically Signed: Elwin Mocha, PA-C 09/16/2020, 8:38 AM   I spent a total of 15 Minutes at the the patient's bedside AND on the patient's hospital floor or unit, greater than 50% of which was counseling/coordinating care for CVA s/p revascularization/SAH.

## 2020-09-16 NOTE — PMR Pre-admission (Addendum)
PMR Admission Coordinator Pre-Admission Assessment   Patient: [name] is an 74 y.o., female MRN: [id number] DOB: 10/14/1945 Height: 5' 10" (177.8 cm) Weight: 95.3 kg                                                                                                                                                  Insurance Information HMO:     PPO:      PCP:      IPA:      80/20:      OTHER:  PRIMARY: Medicare A and B      Policy#: [id number]      Subscriber: pt CM Name:       Phone#:      Fax#:  Pre-Cert#: verified online      Employer:  Benefits:  Phone #:      Name:  Eff. Date: Part A 03/09/2009, Part B 07/08/19     Deduct: $1556      Out of Pocket Max: n/a      Life Max: n/a  CIR: 100%      SNF: 20 full days Outpatient: 80%     Co-Pay: 20% Home Health: 100%      Co-Pay:  DME: 80%     Co-Pay: 20% Providers:  SECONDARY: Medicaid      Policy#: [id number]      Phone#: n/a   Financial Counselor:       Phone#:    The "Data Collection Information Summary" for patients in Inpatient Rehabilitation Facilities with attached "Privacy Act Statement-Health Care Records" was provided and verbally reviewed with: Family   Emergency Contact Information Contact Information       Name Relation Home Work Mobile    Swann,[name] Daughter     [phone]    Holmes, Daryl Son [phone]   [phone]    Thornberry,Deirdre Daughter [phone]             Current Medical History  Patient Admitting Diagnosis: CVA    History of Present Illness: [name] is a 74-year-old right-handed female with history of COPD, DVT during pregnancy, hypertension, thoracicoabdominal aortic aneurysm, quit smoking 33 years ago, question medical noncompliance.   Presented 09/10/2020 with dizziness and unsteady gait that persisted over 2 to 3 weeks, as well as intermittent left-sided weakness.  CT/MRI showed right parietal small to moderate size acute to subacute infarct as well as few additional punctate acute to  subacute right centrum semiovale infarcts.  No evidence of hemorrhage.  CT angiogram of head and neck age-indeterminate occlusion of the right cervical ICA just beyond its origin.  At least partial reconstitution at the clinoid intracranially.  Less than 50% stenosis of the left ICA origin.  Admission chemistries unremarkable except potassium 3.3.  Patient underwent right ICA mechanical thrombectomy revascularization per interventional radiology 09/12/2020.  Currently maintained on DAPT   aspirin 81 mg daily and Plavix 75 mg daily for CVA prophylaxis x3 months.  Cleviprex initially added for blood pressure control.  Patient developed increasing right hand pain and numbness follow-up CT of the head 09/15/2020 showed interval development of scattered subarachnoid hemorrhage involving the right sylvian fissure and overlying right frontal cortical sulci.  Small volume intraventricular hemorrhage seen as well likely related to redistribution.  No hydrocephalus or ventricular trapping.  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  Tolerating a regular diet.  Due to patient's left-sided weakness decreased functional mobility was recommended for a comprehensive rehab program.   Complete NIHSS TOTAL: 3 Glasgow Coma Scale Score: 15   Past Medical History      Past Medical History:  Diagnosis Date  . COPD (chronic obstructive pulmonary disease) (HCC)    . History of DVT (deep vein thrombosis)    . Hypertension    . Thoracoabdominal aortic aneurysm (HCC)        Family History  family history is not on file.   Prior Rehab/Hospitalizations:  Has the patient had prior rehab or hospitalizations prior to admission? No   Has the patient had major surgery during 100 days prior to admission? Yes   Current Medications    Current Facility-Administered Medications:  .  0.9 %  sodium chloride infusion, , Intravenous,  Continuous, Kumar, Pardeep, MD, Stopped at 09/11/20 0900 .  0.9 %  sodium chloride infusion, , Intravenous, Continuous, Arora, Ashish, MD .  acetaminophen (TYLENOL) tablet 650 mg, 650 mg, Oral, Q4H PRN, 650 mg at 09/14/20 2221 **OR** acetaminophen (TYLENOL) 160 MG/5ML solution 650 mg, 650 mg, Per Tube, Q4H PRN **OR** acetaminophen (TYLENOL) suppository 650 mg, 650 mg, Rectal, Q4H PRN, Kumar, Pardeep, MD .  amLODipine (NORVASC) tablet 2.5 mg, 2.5 mg, Oral, Daily, Chiu, Stephen K, MD .  aspirin EC tablet 81 mg, 81 mg, Oral, Daily, Louk, Alexandra M, PA-C .  atorvastatin (LIPITOR) tablet 80 mg, 80 mg, Oral, QHS, Kumar, Pardeep, MD, 80 mg at 09/15/20 2214 .  benzonatate (TESSALON) capsule 100 mg, 100 mg, Oral, TID PRN, Chiu, Stephen K, MD .  Chlorhexidine Gluconate Cloth 2 % PADS 6 each, 6 each, Topical, Daily, Chiu, Stephen K, MD, 6 each at 09/16/20 1041 .  clevidipine (CLEVIPREX) infusion 0.5 mg/mL, 0-21 mg/hr, Intravenous, Continuous, de Macedo Rodrigues, Katyucia, MD, Stopped at 09/16/20 0843 .  diphenhydrAMINE (BENADRYL) capsule 25 mg, 25 mg, Oral, Q6H PRN, Chiu, Stephen K, MD, 25 mg at 09/11/20 1720 .  hydrALAZINE (APRESOLINE) injection 5-10 mg, 5-10 mg, Intravenous, Q4H PRN, Sethi, Pramod S, MD, 10 mg at 09/15/20 1611 .  labetalol (NORMODYNE) injection 20 mg, 20 mg, Intravenous, Q2H PRN, Sethi, Pramod S, MD, 20 mg at 09/16/20 0754 .  magnesium sulfate IVPB 1 g 100 mL, 1 g, Intravenous, Once, Pahwani, Ravi, MD .  neomycin-polymyxin-hydrocortisone (CORTISPORIN) OTIC (EAR) suspension 3 drop, 3 drop, Right EAR, Q6H, Chiu, Stephen K, MD, 3 drop at 09/16/20 1122 .  potassium chloride SA (KLOR-CON) CR tablet 40 mEq, 40 mEq, Oral, Once, Pahwani, Ravi, MD .  senna-docusate (Senokot-S) tablet 1 tablet, 1 tablet, Oral, QHS PRN, Kumar, Pardeep, MD .  ticagrelor (BRILINTA) tablet 60 mg, 60 mg, Oral, BID, Louk, Alexandra M, PA-C   Patients Current Diet:  Diet Order                  Diet regular Room  service appropriate? Yes with Assist;   Fluid consistency: Thin  Diet effective now                         Precautions / Restrictions Precautions Precautions: Fall Precaution Comments: L inattention Restrictions Weight Bearing Restrictions: No    Has the patient had 2 or more falls or a fall with injury in the past year?Yes   Prior Activity Level Limited Community (1-2x/wk): no DME at baseline, doesn't drive, daughter takes to appointments   Prior Functional Level Prior Function Level of Independence: Independent Comments: Independent with ADLs/IADLs. Does not drive at baseline.   Self Care: Did the patient need help bathing, dressing, using the toilet or eating?  Independent   Indoor Mobility: Did the patient need assistance with walking from room to room (with or without device)? Independent   Stairs: Did the patient need assistance with internal or external stairs (with or without device)? Independent   Functional Cognition: Did the patient need help planning regular tasks such as shopping or remembering to take medications? Independent   Home Assistive Devices / Equipment Home Assistive Devices/Equipment: None Home Equipment: None   Prior Device Use: Indicate devices/aids used by the patient prior to current illness, exacerbation or injury? None of the above   Current Functional Level Cognition   Arousal/Alertness: Awake/alert Overall Cognitive Status: Impaired/Different from baseline Current Attention Level: Sustained Orientation Level: Oriented X4 Following Commands: Follows one step commands with increased time Safety/Judgement: Decreased awareness of deficits,Decreased awareness of safety General Comments: Pt received asleep, minimal interaction with therapist throughout session, following 1 step commands consistently. Decreased left sided awareness Attention: Sustained Sustained Attention: Appears intact Memory: Impaired Memory Impairment: Retrieval  deficit Awareness: Impaired Awareness Impairment: Anticipatory impairment Problem Solving: Impaired Problem Solving Impairment: Verbal complex Safety/Judgment: Other (comment) (functional for basic safety)    Extremity Assessment (includes Sensation/Coordination)   Upper Extremity Assessment: Generalized weakness RUE Deficits / Details: strength 5/5, AROM is WNLs LUE Deficits / Details: AROM WFL with increased time. MMT grossly 4/5. LUE Sensation: WNL LUE Coordination: decreased fine motor,decreased gross motor  Lower Extremity Assessment: Defer to PT evaluation LLE Deficits / Details: grossly 3+/5 with very limited endurance. pt denies difference in sensation     ADLs   Overall ADL's : Needs assistance/impaired Eating/Feeding: Set up,Sitting Grooming: Min guard,Standing,Wash/dry hands,Wash/dry face Upper Body Bathing: Minimal assistance,Sitting Lower Body Bathing: Maximal assistance,+2 for physical assistance,+2 for safety/equipment,Cueing for safety,Cueing for sequencing,Sitting/lateral leans,Sit to/from stand Upper Body Dressing : Minimal assistance,Sitting Lower Body Dressing: Maximal assistance,+2 for physical assistance,+2 for safety/equipment,Cueing for safety Lower Body Dressing Details (indicate cue type and reason): assist for socks Toilet Transfer: Minimal assistance,+2 for physical assistance,+2 for safety/equipment,Ambulation,Grab bars,RW Toilet Transfer Details (indicate cue type and reason): cues for L side peripheral field deficits Toileting- Clothing Manipulation and Hygiene: Minimal assistance,+2 for physical assistance,+2 for safety/equipment,Sitting/lateral lean,Sit to/from stand Toileting - Clothing Manipulation Details (indicate cue type and reason): Pt standing with RUE holding onto grab bar and LUE performing pericare. Functional mobility during ADLs: Minimal assistance,+2 for physical assistance,+2 for safety/equipment,Cueing for safety,Cueing for  sequencing,Rolling walker,Moderate assistance (ModA iniitally for stability and minA +2 thereafter) General ADL Comments: Patient limited by L-sided weakness, L peripheral visual deficits and decreased awareness of deficits with maximal cues to look to L.     Mobility   Overal bed mobility: Needs Assistance Bed Mobility: Supine to Sit Supine to sit: Min assist General bed mobility comments: Cues for sequencing, exiting towards left side of the bed,   minA for trunk to execute upright positioning     Transfers   Overall transfer level: Needs assistance Equipment used: Rolling walker (2 wheeled) Transfers: Sit to/from Stand Sit to Stand: Min assist  Lateral/Scoot Transfers: Min guard General transfer comment: MinA to rise from edge of bed and toilet, cues for hand placement and use of grab bars     Ambulation / Gait / Stairs / Wheelchair Mobility   Ambulation/Gait Ambulation/Gait assistance: Min assist Gait Distance (Feet): 120 Feet Assistive device: Rolling walker (2 wheeled) Gait Pattern/deviations: Decreased stride length,Decreased dorsiflexion - left,Shuffle,Drifts right/left,Trunk flexed,Step-through pattern General Gait Details: Cues provided for neutral left foot positioning, walker proximity, negotiating obstacles. Pt requires minA for balance and maintaining walker on a straight path. Tendency for left drift and consistently running into several objects on the left. Cues provided for visual scanning, however, pt does not perform. Gait velocity: decreased Gait velocity interpretation: <1.31 ft/sec, indicative of household ambulator     Posture / Balance Balance Overall balance assessment: Needs assistance Sitting-balance support: Feet supported Sitting balance-Leahy Scale: Fair Standing balance support: Bilateral upper extremity supported Standing balance-Leahy Scale: Poor Standing balance comment: reliant on RW High level balance activites: Head turns,Direction changes High  Level Balance Comments: Increased unsteadiness with horizontal/vertical head turns, directional changes.     Special needs/care consideration n/a        Previous Home Environment (from acute therapy documentation) Living Arrangements: Children  Lives With: Daughter Available Help at Discharge: Family,Available 24 hours/day Type of Home: Apartment Home Layout: Two level,Bed/bath upstairs Alternate Level Stairs-Rails: Left Alternate Level Stairs-Number of Steps: flight Home Access: Stairs to enter Entrance Stairs-Rails: None Entrance Stairs-Number of Steps: 2 Bathroom Shower/Tub: Tub/shower unit Bathroom Toilet: Standard Home Care Services: No   Discharge Living Setting Plans for Discharge Living Setting: Lives with (comment) (daughter) Type of Home at Discharge: House Discharge Home Layout: One level Discharge Home Access: Stairs to enter Entrance Stairs-Rails: None Entrance Stairs-Number of Steps: 2 Discharge Bathroom Shower/Tub: Tub/shower unit Discharge Bathroom Toilet: Standard Discharge Bathroom Accessibility: Yes How Accessible: Accessible via walker Does the patient have any problems obtaining your medications?: No   Social/Family/Support Systems Anticipated Caregiver: daughter, [name] Swann Anticipated Caregiver's Contact Information: [phone] Ability/Limitations of Caregiver: min assist Caregiver Availability: 24/7 Discharge Plan Discussed with Primary Caregiver: Yes Is Caregiver In Agreement with Plan?: Yes Does Caregiver/Family have Issues with Lodging/Transportation while Pt is in Rehab?: No     Goals Patient/Family Goal for Rehab: PT/OT/SLP Mod I/Supervision  Expected length of stay: 10-14 days Pt/Family Agrees to Admission and willing to participate: Yes Program Orientation Provided & Reviewed with Pt/Caregiver Including Roles  & Responsibilities: Yes     Decrease burden of Care through IP rehab admission: n/a     Possible need for SNF placement  upon discharge: Not anticipated.      Patient Condition: This patient's medical and functional status has changed since the consult dated: 09/14/2020 in which the Rehabilitation Physician determined and documented that the patient's condition is appropriate for intensive rehabilitative care in an inpatient rehabilitation facility. See "History of Present Illness" (above) for medical update. Functional changes are: pt min assist with mobility and ADLs. Patient's medical and functional status update has been discussed with the Rehabilitation physician and patient remains appropriate for inpatient rehabilitation. Will admit to inpatient rehab today.   Preadmission Screen Completed By:  Caitlin E Warren, PT, DPT 09/16/2020 11:36 AM ______________________________________________________________________   Discussed status with Dr. Treven Holtman on 09/16/20 at  11:43 AM

## 2020-09-16 NOTE — Progress Notes (Signed)
Inpatient Rehabilitation Medication Review by a Pharmacist  A complete drug regimen review was completed for this patient to identify any potential clinically significant medication issues.  Clinically significant medication issues were identified:  no  Check AMION for pharmacist assigned to patient if future medication questions/issues arise during this admission.  Pharmacist comments: n/a  Time spent performing this drug regimen review (minutes):  10 minutes   Rosalyn Gess, Pharm D  09/16/2020 3:34 PM

## 2020-09-16 NOTE — Progress Notes (Signed)
STROKE TEAM PROGRESS NOTE   INTERVAL HISTORY No acute events  Sitting up in chair, alert. Stable to slightly improved neuro exam.  Medically stable to be transferred to inpatient rehab later today if bed becomes available.  Repeat CT scan of the head is pending. OBJECTIVE Vitals:   09/16/20 1100 09/16/20 1200 09/16/20 1300 09/16/20 1400  BP: 128/62 133/68 (!) 157/65   Pulse:   73 (!) 59  Resp: 17 17 17 19   Temp:  98.3 F (36.8 C)    TempSrc:  Oral    SpO2:  97% 92% 98%  Weight:      Height:        CBC:  Recent Labs  Lab 09/10/20 1540 09/10/20 1559 09/14/20 0407 09/15/20 0410  WBC 4.4   < > 6.0 5.7  NEUTROABS 1.9  --   --   --   HGB 12.2   < > 9.9* 10.3*  HCT 38.3   < > 30.8* 31.5*  MCV 90.5   < > 91.1 88.5  PLT 141*   < > 123* 147*   < > = values in this interval not displayed.    Basic Metabolic Panel:  Recent Labs  Lab 09/15/20 0410 09/16/20 0943  NA 138 136  K 3.1* 3.4*  CL 105 105  CO2 25 23  GLUCOSE 88 153*  BUN 11 7*  CREATININE 0.64 0.84  CALCIUM 8.9 8.8*  MG 1.7 1.9    Lipid Panel:     Component Value Date/Time   CHOL 230 (H) 09/12/2020 0025   TRIG 83 09/12/2020 0025   HDL 53 09/12/2020 0025   CHOLHDL 4.3 09/12/2020 0025   VLDL 17 09/12/2020 0025   LDLCALC 160 (H) 09/12/2020 0025   HgbA1c:  Lab Results  Component Value Date   HGBA1C 5.3 09/11/2020   Urine Drug Screen: No results found for: LABOPIA, COCAINSCRNUR, LABBENZ, AMPHETMU, THCU, LABBARB  Alcohol Level     Component Value Date/Time   ETH <10 09/10/2020 1540    IMAGING  CT HEAD CODE STROKE WO CONTRAST 09/11/2020 IMPRESSION:  1. Stable appearance of right parietal acute/subacute infarct.  2. No new infarct.  3. ASPECTS is 10/10.   CT ANGIO HEAD NECK W WO CM W PERF CT CEREBRAL PERFUSION W CONTRAST 09/11/2020 IMPRESSION:  1. 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  2. 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  3. Progressive proximal right M1 segment stenosis with significant loss of perfusion in the right MCA branch vessels.  4. Stable atherosclerotic changes at the left carotid bifurcation without significant stenosis.  5. 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 Basilar artery is similar the prior exam.  6.  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 No evidence of hemorrhage.   ECHOCARDIOGRAM COMPLETE 09/12/2020 IMPRESSIONS   1. Left ventricular ejection fraction, by estimation, is 60 to 65%. The left ventricle has normal function. The left ventricle has no regional wall motion abnormalities. There is mild left ventricular hypertrophy. Left ventricular diastolic parameters are consistent with Grade I diastolic dysfunction (impaired relaxation).   2. Right ventricular systolic function is normal. The right ventricular size is normal. Tricuspid regurgitation signal is inadequate for assessing PA pressure.   3. Left  atrial size was moderately dilated.   4. Right atrial size was mildly dilated.   5. The mitral valve is normal in structure. Trivial mitral valve regurgitation. No evidence of mitral stenosis.   6. The aortic valve is grossly normal. There is mild calcification of the aortic valve. Aortic valve regurgitation is not visualized. No aortic stenosis is present.   7. Aortic dilatation noted. There is borderline dilatation of the ascending aorta, measuring 39 mm.   8. The inferior vena cava is normal in size with greater than 50% respiratory variability, suggesting right atrial pressure of 3 mmHg.  Conclusion(s)/Recommendation(s): No intracardiac source of embolism detected on this transthoracic study. A transesophageal  echocardiogram is recommended to exclude cardiac source of embolism if clinically indicated.    ECG - SB rate 57 BPM. (See cardiology reading for complete details)  PHYSICAL EXAM Blood pressure (!) 157/65, pulse (!) 59, temperature 98.3 F (36.8 C), temperature source Oral, resp. rate 19, height 5\' 10"  (1.778 m), weight 95.3 kg, SpO2 98 %.  GENERAL: She is resting well but responsive.  HEENT: Neck is supple no trauma noted.  EXTREMITIES: No edema   BACK: Normal  SKIN: Normal by inspection.    MENTAL STATUS: She is awake alert and interactive She is oriented to hospital, year, month and medical condition.  She follows commands well.  CRANIAL NERVES: Pupils are equal, round and reactive to light and accomodation; extra ocular movements are full, there is no significant nystagmus; visual fields are full; upper and lower facial muscles are normal in strength and symmetric, there is no flattening of the nasolabial folds; tongue is midline; uvula is midline; shoulder elevation is normal.  MOTOR:  Right side strength is 4+/5.  Left upper extremity 4 - and left leg 3.  Diminished fine finger movements on the left.  Left grip weakness.  Orbits right over left upper extremity.  There is mild drift of the left arm and left leg.  COORDINATION: Left finger to nose is normal, right finger to nose is normal, No rest tremor; no intention tremor; no postural tremor; no bradykinesia.  Gait not tested  ASSESSMENT/PLAN Ms. Yemariam Magar is a 75 y.o. female with history of HTN, COPD, aortic aneurysm, DVT in pregnancy, remote smoking history, non-adherence to medications for the past year, and a remote MI who presented to [facility] 5/5 for evaluation of feeling dizzy, recent headaches and off balance for 2 - 3 weeks causing her to run into walls and bump her head.  She did not receive IV t-PA due to late presentation (>4.5 hours from time of onset) Interventional Radiology - There was an occlusion of the  cervical right ICA at the bulb and distal right M1/MCA occlusion. Mechanical thrombectomy performed with combined stent retriever (embotrap) and direct contact aspiration with complete recanalization (TICI3) 09/11/2020  - Dr. 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 (consider TEE)  Resultant left-sided weakness that is mild.  Code Stroke CT Head - Stable appearance of right parietal acute/subacute infarct. No new infarct. ASPECTS is 10/10.   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 Progressive proximal right M1 segment stenosis with significant loss of perfusion  in the right MCA branch vessels. 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 Basilar artery is similar the prior exam.   CT Perfusion - Right MCA territory ischemia with 162 mL of surrounding penumbra.    Repeat CT head 09/15/2020: Interval development of scattered subarachnoid hemorrhage involving the right sylvian fissure and right frontal cortical sulci trace intraventricular hemorrhage.  Acute to subacute right MCA distribution infarct without midline shift or mass-effect.  Carotid Doppler -not necessary   2D Echo - EF 60 - 65%. No cardiac source of emboli identified.   Sars Corona Virus 2 - negative  LDL - 160  HgbA1c - 5.3  UDS - not ordered  VTE prophylaxis - SCDs Diet  Diet Order            Diet regular Room service appropriate? Yes with Assist; Fluid consistency: Thin  Diet effective now                 No antithrombotic prior to admission. DAPT ASA 81 and Plavix started then held on 09/15/2020 due to new subarachnoid hemorrhage. When appropriate  will need 3 months of DAPT.   Patient will be counseled to be compliant with her antithrombotic medications  Patient will likely need need outpatient loop recorder to look for paroxysmal A. fib at discharge  Ongoing aggressive stroke risk factor management  Therapy recommendations:  CIR recommended - Rehabilitation MD consult ordered  Disposition:  Pending  Hypertension  Home BP meds: none   Current BP meds: Zestril ; Cleviprex and hydralazine prn  Stable  SBP 160 mmHg. Marland Kitchen Long-term BP goal normotensive  Hyperlipidemia  Home Lipid lowering medication: none   LDL 160, goal < 70  Current lipid lowering medication: Lipitor 80 mg daily   Continue statin at discharge  Other Stroke Risk Factors  Advanced age  Former cigarette smoker - quit  Obesity, Body mass index is 30.13 kg/m., recommend weight loss, diet and exercise as appropriate   Coronary artery disease  Medical non compliance  Other Active Problems, Findings, Recommendations and/or Plan  Code status - Full code  Aortic Atherosclerosis (ICD10-I70.0)  Bradycardia - 30's - 40's  Repeat CT scan of the head shows stable appearance of the small subarachnoid hemorrhage without any increase.  P2 Y 12 level shows slight suppression of platelet function.  Discussed with Dr. Micah Flesher about request.  Resume aspirin 81 mg and Brilinta 60 mg twice daily.  Patient will be transferred to inpatient rehab later if bed available.  Discussed with Dr. Jacqulyn Bath.  Greater than 50% time during this 35-minute visit was spent in counseling and coordination of care and discussion with care team.  Stroke team will sign off.  Kindly call for questions  Delia Heady, MD 1.To contact Stroke Continuity provider, please refer to [email or web address]. After hours, contact General Neurology

## 2020-09-17 ENCOUNTER — Inpatient Hospital Stay (HOSPITAL_COMMUNITY): Payer: Medicare Other

## 2020-09-17 DIAGNOSIS — I610 Nontraumatic intracerebral hemorrhage in hemisphere, subcortical: Secondary | ICD-10-CM

## 2020-09-17 LAB — COMPREHENSIVE METABOLIC PANEL
ALT: 9 U/L (ref 0–44)
AST: 16 U/L (ref 15–41)
Albumin: 3.2 g/dL — ABNORMAL LOW (ref 3.5–5.0)
Alkaline Phosphatase: 60 U/L (ref 38–126)
Anion gap: 7 (ref 5–15)
BUN: 12 mg/dL (ref 8–23)
CO2: 24 mmol/L (ref 22–32)
Calcium: 9 mg/dL (ref 8.9–10.3)
Chloride: 106 mmol/L (ref 98–111)
Creatinine, Ser: 0.76 mg/dL (ref 0.44–1.00)
GFR, Estimated: >60 mL/min (ref >60)
Glucose, Bld: 96 mg/dL (ref 70–99)
Potassium: 3.5 mmol/L (ref 3.5–5.1)
Sodium: 137 mmol/L (ref 135–145)
Total Bilirubin: 0.9 mg/dL (ref 0.3–1.2)
Total Protein: 6.7 g/dL (ref 6.5–8.1)

## 2020-09-17 LAB — CBC WITH DIFFERENTIAL/PLATELET
Abs Immature Granulocytes: 0.02 10*3/uL (ref 0.00–0.07)
Basophils Absolute: 0 10*3/uL (ref 0.0–0.1)
Basophils Relative: 1 %
Eosinophils Absolute: 0.5 10*3/uL (ref 0.0–0.5)
Eosinophils Relative: 9 %
HCT: 33.8 % — ABNORMAL LOW (ref 36.0–46.0)
Hemoglobin: 10.8 g/dL — ABNORMAL LOW (ref 12.0–15.0)
Immature Granulocytes: 0 %
Lymphocytes Relative: 38 %
Lymphs Abs: 2.2 10*3/uL (ref 0.7–4.0)
MCH: 28.6 pg (ref 26.0–34.0)
MCHC: 32 g/dL (ref 30.0–36.0)
MCV: 89.4 fL (ref 80.0–100.0)
Monocytes Absolute: 0.6 10*3/uL (ref 0.1–1.0)
Monocytes Relative: 11 %
Neutro Abs: 2.4 10*3/uL (ref 1.7–7.7)
Neutrophils Relative %: 41 %
Platelets: 183 10*3/uL (ref 150–400)
RBC: 3.78 MIL/uL — ABNORMAL LOW (ref 3.87–5.11)
RDW: 13.2 % (ref 11.5–15.5)
WBC: 5.8 10*3/uL (ref 4.0–10.5)
nRBC: 0 % (ref 0.0–0.2)

## 2020-09-17 LAB — PLATELET INHIBITION P2Y12: Platelet Function  P2Y12: 28 [PRU] — ABNORMAL LOW (ref 182–335)

## 2020-09-17 MED ORDER — AMLODIPINE BESYLATE 5 MG PO TABS
5.0000 mg | ORAL_TABLET | Freq: Every day | ORAL | Status: DC
  Administered 2020-09-17 – 2020-09-28 (×12): 5 mg via ORAL
  Filled 2020-09-17 (×13): qty 1

## 2020-09-17 MED ORDER — TICAGRELOR 90 MG PO TABS
45.0000 mg | ORAL_TABLET | Freq: Two times a day (BID) | ORAL | Status: DC
  Administered 2020-09-18 – 2020-09-21 (×7): 45 mg via ORAL
  Filled 2020-09-17 (×8): qty 1

## 2020-09-17 MED ORDER — TICAGRELOR 90 MG PO TABS: 45.0000 mg | ORAL_TABLET | Freq: Two times a day (BID) | ORAL | Status: DC

## 2020-09-17 MED ORDER — AMLODIPINE BESYLATE 5 MG PO TABS: 5.0000 mg | ORAL_TABLET | Freq: Every day | ORAL | Status: DC

## 2020-09-17 NOTE — Progress Notes (Signed)
PROGRESS NOTE   Subjective/Complaints:  Pt is slow to respond but able to ansewer simple questions  ROS- neg CP SOB, N/V/D  Objective:   CT HEAD WO CONTRAST  Result Date: 09/16/2020 CLINICAL DATA:  Stroke follow-up EXAM: CT HEAD WITHOUT CONTRAST TECHNIQUE: Contiguous axial images were obtained from the base of the skull through the vertex without intravenous contrast. COMPARISON:  Yesterday FINDINGS: Brain: Subarachnoid and intraventricular hemorrhage is non progressed and similar in distribution. Nonprogressive right parietal infarct. No hydrocephalus, mass, or midline shift. Vascular: No hyperdense vessel or unexpected calcification. Skull: Normal. Negative for fracture or focal lesion. Sinuses/Orbits: No acute finding. IMPRESSION: No new abnormality or progression of intracranial hemorrhage and right cerebral infarction. Electronically Signed   By: Marnee Spring M.D.   On: 09/16/2020 10:58   Recent Labs    09/15/20 0410 09/17/20 0516  WBC 5.7 5.8  HGB 10.3* 10.8*  HCT 31.5* 33.8*  PLT 147* 183   Recent Labs    09/16/20 0943 09/17/20 0516  NA 136 137  K 3.4* 3.5  CL 105 106  CO2 23 24  GLUCOSE 153* 96  BUN 7* 12  CREATININE 0.84 0.76  CALCIUM 8.8* 9.0   No intake or output data in the 24 hours ending 09/17/20 0743      Physical Exam: Vital Signs Blood pressure (!) 184/57, pulse (!) 51, temperature 98.7 F (37.1 C), temperature source Oral, resp. rate 18, height 5\' 10"  (1.778 m), weight 87.7 kg, SpO2 100 %.   General: No acute distress Mood and affect are appropriate Heart: Regular rate and rhythm no rubs murmurs or extra sounds Lungs: Clear to auscultation, breathing unlabored, no rales or wheezes Abdomen: Positive bowel sounds, soft nontender to palpation, nondistended Extremities: No clubbing, cyanosis, or edema Skin: No evidence of breakdown, no evidence of rash Neurologic: Cranial nerves II through  XII intact, motor strength is 5/5 in bilateral deltoid, bicep, tricep, grip, hip flexor, knee extensors, ankle dorsiflexor and plantar flexor Sensory exam normal sensation to light touch and proprioception in bilateral upper and lower extremities Cerebellar exam normal finger to nose to finger as well as heel to shin in bilateral upper and lower extremities Musculoskeletal: Full range of motion in all 4 extremities. Pain with RIght shoulder abd, no deformity or crepitus , mild pain with Adduction as well, no pain with ext rotation    Assessment/Plan: 1. Functional deficits which require 3+ hours per day of interdisciplinary therapy in a comprehensive inpatient rehab setting.  Physiatrist is providing close team supervision and 24 hour management of active medical problems listed below.  Physiatrist and rehab team continue to assess barriers to discharge/monitor patient progress toward functional and medical goals  Care Tool:  Bathing              Bathing assist       Upper Body Dressing/Undressing Upper body dressing        Upper body assist Assist Level: Minimal Assistance - Patient > 75%    Lower Body Dressing/Undressing Lower body dressing            Lower body assist Assist for lower body dressing: Moderate Assistance - Patient 50 -  74%     Toileting Toileting    Toileting assist Assist for toileting: Minimal Assistance - Patient > 75%     Transfers Chair/bed transfer  Transfers assist           Locomotion Ambulation   Ambulation assist              Walk 10 feet activity   Assist           Walk 50 feet activity   Assist           Walk 150 feet activity   Assist           Walk 10 feet on uneven surface  activity   Assist           Wheelchair     Assist               Wheelchair 50 feet with 2 turns activity    Assist            Wheelchair 150 feet activity     Assist           Blood pressure (!) 184/57, pulse (!) 51, temperature 98.7 F (37.1 C), temperature source Oral, resp. rate 18, height 5\' 10"  (1.778 m), weight 87.7 kg, SpO2 100 %.    Medical Problem List and Plan: 1.  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  Status post right ICA revascularization             -patient may shower             -ELOS/Goals: 8-12 days/Supervision/Mod I             Admit to CIR 2.  Antithrombotics: -DVT/anticoagulation: SCDs             -antiplatelet therapy: Aspirin 81 mg daily, Brilinta 60 mg twice daily 3. Pain Management: Tylenol as needed, right shoulder pain mainly with abduction will check xray- no hx of fall per pt  4. Mood: Provide emotional support             -antipsychotic agents: N/A 5. Neuropsych: This patient is capable of making decisions on her own behalf. 6. Skin/Wound Care: Routine skin checks 7. Fluids/Electrolytes/Nutrition: Routine in and outs             CMP ordered for tomorrow 8.  Hypertension.  Norvasc 2.5 mg daily.- increase to 5mg  starting today  Vitals:   09/17/20 0421 09/17/20 0535  BP: (!) 188/77 (!) 184/57  Pulse: (!) 51   Resp: 18   Temp: 98.7 F (37.1 C)   SpO2: 100%                  Monitor with increased mobility 9.  Hyperlipidemia: Lipitor 10.  History of COPD with remote tobacco use.               O2 Sats every shift             Questionable medical noncompliance.  Provide counseling  LOS: 1 days A FACE TO FACE EVALUATION WAS PERFORMED  11/17/20 09/17/2020, 7:43 AM

## 2020-09-17 NOTE — Evaluation (Signed)
Physical Therapy Assessment and Plan  Patient Details  Name: [name] MRN: [id number] Date of Birth: 12/25/1945  PT Diagnosis: Abnormal posture, Abnormality of gait, Cognitive deficits, Difficulty walking, Hemiparesis non-dominant and Muscle weakness Rehab Potential: Good ELOS: 7-10   Today's Date: 09/17/2020 PT Individual Time: 0900-1010 PT Individual Time Calculation (min): 70 min    Hospital Problem: Principal Problem:   CVA (cerebral vascular accident) [facility])   Past Medical History:  Past Medical History:  Diagnosis Date  . COPD (chronic obstructive pulmonary disease) ([place])   . History of DVT (deep vein thrombosis)   . Hypertension   . Thoracoabdominal aortic aneurysm [facility])    Past Surgical History:  Past Surgical History:  Procedure Laterality Date  . IR CT HEAD LTD  09/11/2020  . IR INTRAVSC STENT CERV CAROTID W/O EMB-PROT MOD SED INC ANGIO  09/11/2020  . IR PERCUTANEOUS ART THROMBECTOMY/INFUSION INTRACRANIAL INC DIAG ANGIO  09/11/2020      . IR PERCUTANEOUS ART THROMBECTOMY/INFUSION INTRACRANIAL INC DIAG ANGIO  09/11/2020  . IR US GUIDE VASC ACCESS RIGHT  09/11/2020  . NO PAST SURGERIES    . RADIOLOGY WITH ANESTHESIA N/A 09/11/2020   Procedure: IR WITH ANESTHESIA;  Surgeon: Luanne Bras, MD;  Location: [place];  Service: Radiology;  Laterality: N/A;    Assessment & Plan Clinical Impression: Patient is a 75 y.o. year old female with history of COPD, DVT during pregnancy hypertension, thoracicoabdominal aortic aneurysm, quit smoking 33 years ago, question medical noncompliance.  History taken from chart review and patient. Patient lives with her family.  Independent prior to admission.  Two-level home bed and bath upstairs.  Independent ADLs.  She does not drive.  Presented on 09/10/2020 with dizziness and unsteady gait that persisted over 2-3 weeks as well as intermittent left-sided weakness.  CT/MRI showed right parietal small to moderate size acute to subacute infarct as well  as few additional punctate acute to subacute right centrum semiovale infarcts.  No evidence of hemorrhage.  CT angiogram of head and neck age-indeterminate occlusion of the right cervical ICA just beyond its origin.  At least partial reconstitution at the clinoid intracranially.  Less than 50% stenosis of the left ICA origin.  Admission chemistries unremarkable except potassium 3.3. Patient underwent right ICA mechanical thrombectomy revascularization per interventional radiology 09/12/2020.  Currently maintained on DAPT aspirin 81 mg daily and Brilinta for CVA prophylaxis x3 months.  Cleviprex initially added for blood pressure control.  Patient developed increasing right hand pain and numbness follow-up CT of the head 09/15/2020 showed interval development of scattered SAH involving the right sylvian fissure and overlying right frontal cortical sulci.  Small volume intraventricular hemorrhage seen as well likely related to redistribution.  No hydrocephalus or ventricular trapping.  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  Due to patient's left-sided hemiparesis decreased functional mobility was admitted for a comprehensive rehab program. Please see preadmission assessment from earlier today as well.  Patient currently requires min with mobility secondary to muscle weakness, decreased cardiorespiratoy endurance, decreased attention to left, left side neglect and decreased motor planning, decreased initiation, decreased attention, decreased awareness, decreased problem solving, decreased safety awareness, decreased memory and delayed processing and decreased sitting balance, decreased standing balance, decreased postural control, hemiplegia and decreased balance strategies.  Prior to hospitalization, patient was independent  with mobility and lived with Daughter in a  [place] home.  Home access is  2Stairs to enter.  Patient will benefit from skilled PT intervention to maximize safe functional mobility, minimize fall risk and decrease caregiver burden for planned discharge home with 24 hour supervision.  Anticipate patient will benefit from follow up [place] at discharge.  PT - End of Session Activity Tolerance: Tolerates 30+ min activity with multiple rests Endurance Deficit: Yes PT Assessment Rehab Potential (ACUTE/IP ONLY): Good PT Barriers to Discharge: [place] home environment;Home environment access/layout;Behavior PT Barriers to Discharge Comments: L inattention PT Patient demonstrates impairments in the following area(s): Balance;Behavior;Endurance;Motor;Nutrition;Perception;Safety;Sensory;Skin Integrity PT Transfers Functional Problem(s): Bed Mobility;Bed to Chair;Car;Furniture PT Locomotion Functional Problem(s): Ambulation;Stairs PT Plan PT Intensity: Minimum of 1-2 x/day ,45 to 90 minutes PT Frequency: 5 out of 7 days PT Duration Estimated Length of Stay: 7-10 PT Treatment/Interventions: Ambulation/gait training;Discharge planning;Functional mobility training;Psychosocial support;Therapeutic Activities;Visual/perceptual remediation/compensation;Balance/vestibular training;Disease management/prevention;Neuromuscular re-education;Skin care/wound management;Therapeutic Exercise;Wheelchair propulsion/positioning;Cognitive remediation/compensation;DME/adaptive equipment instruction;Pain management;Splinting/orthotics;UE/LE Strength taining/ROM;Community reintegration;Functional electrical stimulation;Patient/family education;Stair training;UE/LE Coordination activities PT Transfers Anticipated Outcome(s): spv PT Locomotion Anticipated Outcome(s): spv (STAIRS cga) PT Recommendation Recommendations for Other Services: Neuropsych consult Follow Up Recommendations: Home health PT;Outpatient PT;24 hour supervision/assistance Patient destination:  Home Equipment Recommended: To be determined   PT Evaluation Precautions/Restrictions Precautions Precautions: Fall Precaution Comments: L inattention Restrictions Weight Bearing Restrictions: No Home Living/Prior Functioning Home Living Living Arrangements: Children Available Help at Discharge: Family;Available 24 hours/day Type of Home: Apartment Home Access: Stairs to enter Entrance Stairs-Number of Steps: 2 Entrance Stairs-Rails: Left (when ascending) Home Layout: Two level;Bed/bath upstairs;1/2 bath on main level Alternate Level Stairs-Number of Steps: flight Alternate Level Stairs-Rails: Left Bathroom Shower/Tub: Chiropodist: Standard Bathroom Accessibility: Yes  Lives With: Daughter Prior Function Level of Independence: Independent with basic ADLs;Independent with homemaking with ambulation;Independent with gait;Independent with transfers  Able to Take Stairs?: Yes Driving: No Vision/Perception  Perception Perception: Impaired Inattention/Neglect: Does not attend to left visual field Praxis Praxis: Impaired Praxis Impairment Details: Initiation;Motor planning  Cognition Overall Cognitive Status: Impaired/Different from baseline Arousal/Alertness: Awake/alert Orientation Level: Oriented X4 Sustained Attention: Appears intact Memory: Impaired Awareness: Impaired Problem Solving: Impaired Safety/Judgment: Impaired Sensation Sensation Light Touch: Appears Intact Hot/Cold: Not tested Proprioception: Appears Intact Stereognosis: Appears Intact Coordination Gross Motor Movements are Fluid and Coordinated: No Fine Motor Movements are Fluid and Coordinated: No Coordination and Movement Description: Mild L hemi with decr coordination (UE >LE) Heel Shin Test: slightly limited ROM L Motor  Motor Motor: Hemiplegia;Abnormal postural alignment and control Motor - Skilled Clinical Observations: Mild L hemi with L inattention   Trunk/Postural  Assessment  Cervical Assessment Cervical Assessment: Exceptions to [facility] (R gaze preference, only tendency away from neutral) Thoracic Assessment Thoracic Assessment: Exceptions to [facility] (rounded shoulders) Lumbar Assessment Lumbar Assessment:  (posterior pelvic tilt) Postural Control Postural Control: Deficits on evaluation Righting Reactions: delayed and inadeqaute Protective Responses: delayed and inadequate  Balance Balance Balance Assessed: Yes Standardized Balance Assessment Standardized Balance Assessment: Berg Balance Test;Timed Up and Go Test Berg Balance Test Sit to Stand: Needs minimal aid to stand or to stabilize Standing Unsupported: Able to stand 2 minutes with supervision Sitting with Back Unsupported but Feet Supported on Floor or Stool: Able to sit 2 minutes under supervision Stand to Sit: Controls descent by using hands Transfers: Needs one person to assist Standing Unsupported with Eyes Closed: Able to stand 10 seconds with supervision Standing Ubsupported with Feet Together: Able to place feet together independently but unable to hold for 30 seconds From Standing, Reach Forward with Outstretched Arm: Reaches forward but needs supervision From  Standing Position, Pick up Object from Floor: Unable to pick up and needs supervision From Standing Position, Turn to Look Behind Over each Shoulder: Needs supervision when turning Turn 360 Degrees: Needs assistance while turning Standing Unsupported, Alternately Place Feet on Step/Stool: Able to complete >2 steps/needs minimal assist Standing Unsupported, One Foot in Front: Loses balance while stepping or standing Standing on One Leg: Tries to lift leg/unable to hold 3 seconds but remains standing independently Total Score: 21 Timed Up and Go Test TUG: Normal TUG Normal TUG (seconds): 55.74 (RW+ MinA) Static Sitting Balance Static Sitting - Balance Support: Feet supported Static Sitting - Level of Assistance: 5: Stand by  assistance Dynamic Sitting Balance Dynamic Sitting - Balance Support: During functional activity;Feet supported Dynamic Sitting - Level of Assistance:  (CGA) Static Standing Balance Static Standing - Balance Support: Bilateral upper extremity supported Static Standing - Level of Assistance: 4: Min assist Dynamic Standing Balance Dynamic Standing - Balance Support: During functional activity;Bilateral upper extremity supported Dynamic Standing - Level of Assistance: 4: Min assist Extremity Assessment      RLE Assessment RLE Assessment: Within Functional Limits General Strength Comments: grossly 4+/5 LLE Assessment LLE Assessment: Exceptions to [facility] LLE Strength Left Hip Flexion: 3+/5 Left Hip ABduction: 4/5 Left Hip ADduction: 4/5 Left Knee Flexion: 4-/5 Left Knee Extension: 4/5 Left Ankle Dorsiflexion: 3/5 Left Ankle Plantar Flexion: 4-/5  Care Tool Care Tool Bed Mobility Roll left and right activity   Roll left and right assist level: Supervision/Verbal cueing    Sit to lying activity   Sit to lying assist level: Contact Guard/Touching assist    Lying to sitting edge of bed activity   Lying to sitting edge of bed assist level: Supervision/Verbal cueing Lying to sitting edge of bed assist device comment: HOB elevated   Care Tool Transfers Sit to stand transfer   Sit to stand assist level: Moderate Assistance - Patient 50 - 74%    Chair/bed transfer   Chair/bed transfer assist level: Minimal Assistance - Patient > 75%     Toilet transfer Toilet transfer activity did not occur: Safety/medical concerns      Car transfer   Car transfer assist level: Moderate Assistance - Patient 50 - 74%      Care Tool Locomotion Ambulation   Assist level: Minimal Assistance - Patient > 75% Assistive device: Walker-rolling Max distance: 87  Walk 10 feet activity   Assist level: Minimal Assistance - Patient > 75% Assistive device: Walker-rolling   Walk 50 feet with 2 turns  activity   Assist level: Minimal Assistance - Patient > 75% Assistive device: Walker-rolling  Walk 150 feet activity Walk 150 feet activity did not occur: Safety/medical concerns      Walk 10 feet on uneven surfaces activity Walk 10 feet on uneven surfaces activity did not occur: Safety/medical concerns      Stairs   Assist level: Minimal Assistance - Patient > 75% Stairs assistive device: 1 hand rail Max number of stairs: 8  Walk up/down 1 step activity   Walk up/down 1 step (curb) assist level: Minimal Assistance - Patient > 75% Walk up/down 1 step or curb assistive device: 1 hand rail    Walk up/down 4 steps activity Walk up/down 4 steps assist level: Minimal Assistance - Patient > 75% Walk up/down 4 steps assistive device: 1 hand rail  Walk up/down 12 steps activity Walk up/down 12 steps activity did not occur: Safety/medical concerns      Pick up small objects from  floor Pick up small object from the floor (from standing position) activity did not occur: Safety/medical concerns      Wheelchair Will patient use wheelchair at discharge?: No          Wheel 50 feet with 2 turns activity      Wheel 150 feet activity        Refer to Care Plan for Long Term Goals  SHORT TERM GOAL WEEK 1 PT Short Term Goal 1 (Week 1): STG= LTG based on ELOS  Recommendations for other services: Neuropsych  Skilled Therapeutic Intervention Mobility Bed Mobility Bed Mobility: Rolling Right;Rolling Left;Supine to Sit Rolling Right: Supervision/verbal cueing Rolling Left: Supervision/Verbal cueing Supine to Sit: Contact Guard/Touching assist Transfers Transfers: Sit to Stand;Stand to Sit;Stand Pivot Transfers Sit to Stand: Moderate Assistance - Patient 50-74% Stand to Sit: Minimal Assistance - Patient > 75% Stand Pivot Transfers: Minimal Assistance - Patient > 75% Stand Pivot Transfer Details: Verbal cues for gait pattern;Verbal cues for precautions/safety Transfer (Assistive device):  1 person hand held assist Locomotion  Gait Ambulation: Yes Gait Assistance: Minimal Assistance - Patient > 75% Gait Distance (Feet): 87 Feet Assistive device: Rolling walker Gait Assistance Details: Verbal cues for safe use of DME/AE;Verbal cues for gait pattern;Verbal cues for technique;Verbal cues for precautions/safety Gait Gait: Yes Gait Pattern: Impaired Gait Pattern: Step-through pattern;Decreased step length - right;Decreased stance time - left;Decreased dorsiflexion - left;Decreased weight shift to left;Poor foot clearance - left;Trunk flexed;Left foot flat Gait velocity: decreased (patient reports pre-morbid decreased gait speed) Stairs / Additional Locomotion Stairs: Yes Stairs Assistance: Minimal Assistance - Patient > 75% Stair Management Technique: One rail Right ((due to L inattention to L HR)) Number of Stairs: 8 Height of Stairs: 3 Wheelchair Mobility Wheelchair Mobility: No  Patient received reclined in bed, agreeable to PT eval. She denies pain. Patient maintaining flat affect with minimal engagement with PT throughout eval. Patient grossly MinA with occasional ModA to come to stand from lower surfaces. Patient with poor attention to L with trouble finding wc when placed on her L or noticing L handrail when negotiating stairs. Patient responsive to verbal cues to scan L and engage L UE in tasks, but with noted poor attention at maintaining this throughout task. Patient scored a 21/56 on the [facility] Scale indicating increased risk for falling. She also completed the TUG in 55.74s with RW and MinA also indicating increased risk for falling. Discussed findings with patient and she verbalized understanding. Patient returning to room in wc, seatbelt alarm on, call light within reach.   Discharge Criteria: Patient will be discharged from PT if patient refuses treatment 3 consecutive times without medical reason, if treatment goals not met, if there is a change in medical  status, if patient makes no progress towards goals or if patient is discharged from hospital.  The above assessment, treatment plan, treatment alternatives and goals were discussed and mutually agreed upon: by patient  Debbora Dus 09/17/2020, 10:18 AM

## 2020-09-17 NOTE — Evaluation (Signed)
Speech Language Pathology Assessment and Plan  Patient Details  Name: [name] MRN: [id number] Date of Birth: [birth year]  SLP Diagnosis: Cognitive Impairments  Rehab Potential: Good ELOS: 7-10 days    Today's Date: 09/17/2020 SLP Individual Time: 0800-0900 SLP Individual Time Calculation (min): 60 min   Hospital Problem: Principal Problem:   CVA (cerebral vascular accident) [facility])  Past Medical History:  Past Medical History:  Diagnosis Date  . COPD (chronic obstructive pulmonary disease) ([place])   . History of DVT (deep vein thrombosis)   . Hypertension   . Thoracoabdominal aortic aneurysm [facility])    Past Surgical History:  Past Surgical History:  Procedure Laterality Date  . IR CT HEAD LTD  09/11/2020  . IR INTRAVSC STENT CERV CAROTID W/O EMB-PROT MOD SED INC ANGIO  09/11/2020  . IR PERCUTANEOUS ART THROMBECTOMY/INFUSION INTRACRANIAL INC DIAG ANGIO  09/11/2020      . IR PERCUTANEOUS ART THROMBECTOMY/INFUSION INTRACRANIAL INC DIAG ANGIO  09/11/2020  . IR US GUIDE VASC ACCESS RIGHT  09/11/2020  . NO PAST SURGERIES    . RADIOLOGY WITH ANESTHESIA N/A 09/11/2020   Procedure: IR WITH ANESTHESIA;  Surgeon: Luanne Bras, MD;  Location: [place];  Service: Radiology;  Laterality: N/A;    Assessment / Plan / Recommendation  Patient is a 75 y.o. year old female with history of COPD, DVT during pregnancy hypertension, thoracicoabdominal aortic aneurysm, quit smoking 33 years ago, question medical noncompliance. History taken from chart review and patient. Patient lives with her family. Independent prior to admission. Two-level home bed and bath upstairs. Independent ADLs. She does not drive. Presented on 09/10/2020 with dizziness and unsteady gait that persisted over 2-3 weeks as well as intermittent left-sided weakness. 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 No  evidence of hemorrhage. CT angiogram of head and neck age-indeterminate occlusion of the right cervical ICA just beyond its origin. At least partial reconstitution at the clinoid intracranially. Less than 50% stenosis of the left ICA origin. Admission chemistries unremarkable except potassium 3.3. Patient underwent right ICA mechanical thrombectomy revascularization per interventional radiology 09/12/2020. Currently maintained on DAPT aspirin 81 mg daily and Brilinta for CVA prophylaxis x3 months. Cleviprex initially added for blood pressure control. Patient developed increasing right hand pain and numbness follow-up CT of the head 09/15/2020 showed interval development of scattered SAH involving the right sylvian fissure and overlying right frontal cortical sulci. Small volume intraventricular hemorrhage seen as well likely related to redistribution. No hydrocephalus or ventricular trapping. 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 Due to patient's left-sided hemiparesis decreased functional mobility was admitted for a comprehensive rehab program. Please see preadmission assessment from earlier today as well.   Clinical Impression Patient presents with a mild-moderate cognitive impairment impacting her memory (mainly storage and retrieval), problem solving and awareness. Her swallow function appears to be [facility] and she was without overt s/s aspiration or penetration with consecutive straw sips of thin liquids. When initially asked about deficits, her perception of why she was in CIR she was not able to state anything, but did say she was "told" she had left visual impairment. When SLP asked her to read clock in front of her, she initially said "its 12:12" but then corrected her self, turned her head to left so her right eye was primarily  focused on clock and read  correct time of "8:15". Patient was then able to read page long large print text, and scan left to right without needing cues and with only mild delays when tracking back to left side. During task of reasoning/verbal problem solving, patient did not fully answer and required cues to elaborate on responses. She reported that she does not take any medications and when SLP had her read simulated prescription label, she required modA cues initially, fading to min-modA. She also reported that she does mostly online banking. SLP is recommending skilled ST for patient to achieve at least supervision level with mild complex problem solving and reasoning tasks.  Skilled Therapeutic Interventions          BSE, SLE  SLP Assessment  Patient will need skilled Speech Lanaguage Pathology Services during CIR admission    Recommendations  SLP Diet Recommendations: Thin;Age appropriate regular solids Liquid Administration via: Cup;Straw Medication Administration: Whole meds with liquid Supervision: Patient able to self feed Postural Changes and/or Swallow Maneuvers: Seated upright 90 degrees Oral Care Recommendations: Oral care BID Recommendations for Other Services: Neuropsych consult Patient destination: Home Follow up Recommendations: Other (comment) (TBD) Equipment Recommended: None recommended by SLP    SLP Frequency 3 to 5 out of 7 days   SLP Duration  SLP Intensity  SLP Treatment/Interventions 7-10 days  Minumum of 1-2 x/day, 30 to 90 minutes  Cognitive remediation/compensation;Medication managment;Patient/family education;Functional tasks    Pain Pain Assessment Pain Scale: 0-10 Pain Score: 0-No pain  Prior Functioning Cognitive/Linguistic Baseline: Information not available Type of Home: Apartment  Lives With: Daughter Available Help at Discharge: Family;Available 24 hours/day  SLP Evaluation Cognition Overall Cognitive Status: Impaired/Different from baseline Arousal/Alertness:  Awake/alert Orientation Level: Oriented X4 Attention: Sustained;Selective Selective Attention: Impaired Selective Attention Impairment: Verbal complex Memory: Impaired Memory Impairment: Retrieval deficit Immediate Memory Recall:  (recalled 2/4 words after 3 minute delay with category cue) Problem Solving: Impaired Problem Solving Impairment: Verbal complex Executive Function: Reasoning Reasoning: Impaired Reasoning Impairment: Verbal complex Safety/Judgment: Impaired  Comprehension Auditory Comprehension Overall Auditory Comprehension: Appears within functional limits for tasks assessed Expression Expression Primary Mode of Expression: Verbal Verbal Expression Overall Verbal Expression: Appears within functional limits for tasks assessed Oral Motor Oral Motor/Sensory Function Overall Oral Motor/Sensory Function: Within functional limits Motor Speech Overall Motor Speech: Appears within functional limits for tasks assessed Intelligibility: Intelligible  Care Tool Care Tool Cognition Expression of Ideas and Wants Expression of Ideas and Wants: Some difficulty - exhibits some difficulty with expressing needs and ideas (e.g, some words or finishing thoughts) or speech is not clear   Understanding Verbal and Non-Verbal Content Understanding Verbal and Non-Verbal Content: Usually understands - understands most conversations, but misses some part/intent of message. Requires cues at times to understand   Memory/Recall Ability *first 3 days only Memory/Recall Ability *first 3 days only: Current season;That he or she is in a hospital/hospital unit      Bedside Swallowing Assessment General Date of Onset: 09/12/20 Previous Swallow Assessment: BSE 5/8 Diet Prior to this Study: Thin liquids;Regular Temperature Spikes Noted: No Respiratory Status: Room air History of Recent Intubation: No Behavior/Cognition: Alert;Cooperative;Pleasant mood Oral Cavity - Dentition: Missing  dentition Self-Feeding Abilities: Able to feed self;Needs set up Patient Positioning: Upright in bed Baseline Vocal Quality: Normal Volitional Swallow: Able to elicit  Oral Care Assessment   Ice Chips Ice chips: Not tested Thin Liquid Thin Liquid: Within functional limits Presentation: Straw;Self Fed Nectar Thick  NT Honey Thick  NT  Puree Puree: Not tested Solid Solid: Not tested BSE Assessment Risk for Aspiration Impact on safety and function: No limitations;Mild aspiration risk Other Related Risk Factors: Cognitive impairment  Short Term Goals: Week 1: SLP Short Term Goal 1 (Week 1): Patient will perform simulated money/check/bank management tasks with minA cues for accuracy. SLP Short Term Goal 2 (Week 1): Patient will complete simulated weekly pill box task with supervision A for accuracy. SLP Short Term Goal 3 (Week 1): Patient will compensate for left visual field impairment with supervision A level. SLP Short Term Goal 4 (Week 1): Patient will recall specific exercises, strategies, tasks completed in daily PT, OT, ST sessions with minA.  Refer to Care Plan for Long Term Goals  Recommendations for other services: Neuropsych  Discharge Criteria: Patient will be discharged from SLP if patient refuses treatment 3 consecutive times without medical reason, if treatment goals not met, if there is a change in medical status, if patient makes no progress towards goals or if patient is discharged from hospital.  The above assessment, treatment plan, treatment alternatives and goals were discussed and mutually agreed upon: by patient  Sonia Baller, MA, CCC-SLP Speech Therapy

## 2020-09-17 NOTE — Plan of Care (Signed)
  Problem: RH Balance Goal: LTG Patient will maintain dynamic sitting balance (PT) Description: LTG:  Patient will maintain dynamic sitting balance with assistance during mobility activities (PT) Flowsheets (Taken 09/17/2020 1008) LTG: Pt will maintain dynamic sitting balance during mobility activities with:: Supervision/Verbal cueing Goal: LTG Patient will maintain dynamic standing balance (PT) Description: LTG:  Patient will maintain dynamic standing balance with assistance during mobility activities (PT) Flowsheets (Taken 09/17/2020 1008) LTG: Pt will maintain dynamic standing balance during mobility activities with:: Supervision/Verbal cueing   Problem: Sit to Stand Goal: LTG:  Patient will perform sit to stand with assistance level (PT) Description: LTG:  Patient will perform sit to stand with assistance level (PT) Flowsheets (Taken 09/17/2020 1008) LTG: PT will perform sit to stand in preparation for functional mobility with assistance level: Supervision/Verbal cueing   Problem: RH Bed Mobility Goal: LTG Patient will perform bed mobility with assist (PT) Description: LTG: Patient will perform bed mobility with assistance, with/without cues (PT). Flowsheets (Taken 09/17/2020 1008) LTG: Pt will perform bed mobility with assistance level of: Supervision/Verbal cueing   Problem: RH Bed to Chair Transfers Goal: LTG Patient will perform bed/chair transfers w/assist (PT) Description: LTG: Patient will perform bed to chair transfers with assistance (PT). Flowsheets (Taken 09/17/2020 1008) LTG: Pt will perform Bed to Chair Transfers with assistance level: Supervision/Verbal cueing   Problem: RH Car Transfers Goal: LTG Patient will perform car transfers with assist (PT) Description: LTG: Patient will perform car transfers with assistance (PT). Flowsheets (Taken 09/17/2020 1008) LTG: Pt will perform car transfers with assist:: Supervision/Verbal cueing   Problem: RH Ambulation Goal: LTG  Patient will ambulate in controlled environment (PT) Description: LTG: Patient will ambulate in a controlled environment, # of feet with assistance (PT). Flowsheets (Taken 09/17/2020 1008) LTG: Pt will ambulate in controlled environ  assist needed:: Supervision/Verbal cueing LTG: Ambulation distance in controlled environment: 150 Goal: LTG Patient will ambulate in home environment (PT) Description: LTG: Patient will ambulate in home environment, # of feet with assistance (PT). Flowsheets (Taken 09/17/2020 1008) LTG: Pt will ambulate in home environ  assist needed:: Supervision/Verbal cueing LTG: Ambulation distance in home environment: 50   Problem: RH Stairs Goal: LTG Patient will ambulate up and down stairs w/assist (PT) Description: LTG: Patient will ambulate up and down # of stairs with assistance (PT) Flowsheets (Taken 09/17/2020 1008) LTG: Pt will ambulate up/down stairs assist needed:: Contact Guard/Touching assist Note: Flight L HR per in home set up

## 2020-09-17 NOTE — Progress Notes (Signed)
Patient information reviewed and entered into [facility] System by Becky Malcome Ambrocio, PPS coordinator. Information including medical coding, function ability, and quality indicators will be reviewed and updated through discharge.   

## 2020-09-17 NOTE — Progress Notes (Signed)
Inpatient Rehabilitation Care Coordinator Assessment and Plan Patient Details  Name: [name] MRN: [id number] Date of Birth: August 06, 1945  Today's Date: 09/17/2020  Hospital Problems: Principal Problem:   CVA (cerebral vascular accident) [facility])  Past Medical History:  Past Medical History:  Diagnosis Date  . COPD (chronic obstructive pulmonary disease) ([facility])   . History of DVT (deep vein thrombosis)   . Hypertension   . Thoracoabdominal aortic aneurysm [facility])    Past Surgical History:  Past Surgical History:  Procedure Laterality Date  . IR CT HEAD LTD  09/11/2020  . IR INTRAVSC STENT CERV CAROTID W/O EMB-PROT MOD SED INC ANGIO  09/11/2020  . IR PERCUTANEOUS ART THROMBECTOMY/INFUSION INTRACRANIAL INC DIAG ANGIO  09/11/2020      . IR PERCUTANEOUS ART THROMBECTOMY/INFUSION INTRACRANIAL INC DIAG ANGIO  09/11/2020  . IR US GUIDE VASC ACCESS RIGHT  09/11/2020  . NO PAST SURGERIES    . RADIOLOGY WITH ANESTHESIA N/A 09/11/2020   Procedure: IR WITH ANESTHESIA;  Surgeon: Julieanne Cotton, MD;  Location: [facility] OR;  Service: Radiology;  Laterality: N/A;   Social History:  reports that she quit smoking about 33 years ago. She has never used smokeless tobacco. She reports that she does not drink alcohol and does not use drugs.  Family / Support Systems Marital Status: Single Children: [name] (daughter), Daryl (Son), Deirdre (Daughter) Anticipated Caregiver: Beryle Beams Ability/Limitations of Caregiver: Min A Caregiver Availability: 24/7  Social History Preferred language: English Religion:  Read: Yes Write: Yes Employment Status: Retired Marine scientist Issues: n/a Guardian/Conservator: n/a   Abuse/Neglect    Emotional Status Recent Psychosocial Issues: n/a Psychiatric History: n/a Substance Abuse History: reports not smoking over 33 years  Patient / Family Perceptions, Expectations & Goals Pt/Family understanding of illness & functional limitations: yes Premorbid  pt/family roles/activities: Previously independent, not driving, daughter transports pt to appointments Anticipated changes in roles/activities/participation: Daughter able to assit with task at [employer] A level, dtr will cont to transport pt Pt/family expectations/goals: MOD I/ Supervision  [employer]: None Premorbid Home Care/DME Agencies: None Transportation available at discharge: Family able to transport  Discharge Planning Living Arrangements: Children Support Systems: Children Type of Residence: Private residence (2 level home, 2 steps to enter, B &B and 2nd level) Insurance Resources: [employer] (specify county) Medical sales representative) Surveyor, quantity Resources: Education officer, museum Screen Referred: No Living Expenses: Lives with family (Lives with dtr) Money Management: Patient Does the patient have any problems obtaining your medications?: No Home Management: Independent Patient/Family Preliminary Plans: Daughter able to assisit with medication mang Care Coordinator Barriers to Discharge: Medication compliance Care Coordinator Anticipated Follow Up Needs: HH/OP Expected length of stay: 10-14 Days  Clinical Impression SW went to meet with pt, pt asleep. Called pt family at bedside, no answer. Left detailed messages for pt daughter to return Mack to SW. Contact information and information left in pt room for dtr. SW will cont. to follow up  Andria Rhein 09/17/2020, 1:11 PM

## 2020-09-17 NOTE — Progress Notes (Addendum)
Neurointerventional Radiology Brief Note:  P2Y12 obtained and result is 28.   Discussed with Dr. Tommie Sams.  Given history of hemorrhage, will decrease dose to 45 mg BID starting tomorrow.  Patient has received Brilinta 60 mg this AM.  Will hold night time dose.  Discussed with pharmacy.   RN aware.  Plan to recheck P2Y12 on Monday. Order placed.   Loyce Dys, MS RD PA-C 1:11 PM

## 2020-09-17 NOTE — Evaluation (Signed)
Occupational Therapy Assessment and Plan  Patient Details  Name: [name] MRN: [id number] Date of Birth: 1945/09/18  OT Diagnosis: abnormal posture, cognitive deficits, hemiplegia affecting non-dominant side and muscle weakness (generalized) Rehab Potential: Rehab Potential (ACUTE ONLY): Excellent ELOS: 7-9 days   Today's Date: 09/17/2020 OT Individual Time: 1478-2956 OT Individual Time Calculation (min): 58 min     Hospital Problem: Principal Problem:   CVA (cerebral vascular accident) [facility])   Past Medical History:  Past Medical History:  Diagnosis Date  . COPD (chronic obstructive pulmonary disease) ([place])   . History of DVT (deep vein thrombosis)   . Hypertension   . Thoracoabdominal aortic aneurysm [facility])    Past Surgical History:  Past Surgical History:  Procedure Laterality Date  . IR CT HEAD LTD  09/11/2020  . IR INTRAVSC STENT CERV CAROTID W/O EMB-PROT MOD SED INC ANGIO  09/11/2020  . IR PERCUTANEOUS ART THROMBECTOMY/INFUSION INTRACRANIAL INC DIAG ANGIO  09/11/2020      . IR PERCUTANEOUS ART THROMBECTOMY/INFUSION INTRACRANIAL INC DIAG ANGIO  09/11/2020  . IR US GUIDE VASC ACCESS RIGHT  09/11/2020  . NO PAST SURGERIES    . RADIOLOGY WITH ANESTHESIA N/A 09/11/2020   Procedure: IR WITH ANESTHESIA;  Surgeon: Luanne Bras, MD;  Location: [place];  Service: Radiology;  Laterality: N/A;    Assessment & Plan Clinical Impression: Patient is a 75 y.o. year old female with recent admission to the hospital on 09/10/2020 with dizziness and unsteady gait that persisted over 2-3 weeks as well as intermittent left-sided weakness.  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  No evidence of hemorrhage.  CT angiogram of head and neck age-indeterminate occlusion of the right cervical ICA just beyond its origin.  At least partial reconstitution at the clinoid intracranially.   Less than 50% stenosis of the left ICA origin.  Admission chemistries unremarkable except potassium 3.3. Patient underwent right ICA mechanical thrombectomy revascularization per interventional radiology 09/12/2020.  Currently maintained on DAPT aspirin 81 mg daily and Brilinta for CVA prophylaxis x3 months.  Cleviprex initially added for blood pressure control.  Patient developed increasing right hand pain and numbness follow-up CT of the head 09/15/2020 showed interval development of scattered SAH involving the right sylvian fissure and overlying right frontal cortical sulci.  Small volume intraventricular hemorrhage seen as well likely related to redistribution.  No hydrocephalus or ventricular trapping.  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 Patient transferred to CIR on 09/16/2020 .    Patient currently requires min with basic self-care skills secondary to muscle weakness and muscle paralysis, unbalanced muscle activation and decreased coordination, field cut, left side neglect, decreased initiation, decreased awareness, decreased problem solving, decreased safety awareness, decreased memory and delayed processing and decreased standing balance, hemiplegia and decreased balance strategies.  Prior to hospitalization, patient could complete ADLs with independent .  Patient will benefit from skilled intervention to decrease level of assist with basic self-care skills and increase independence with basic self-care skills prior to discharge home with care partner.  Anticipate patient will require 24 hour supervision and follow up home health.  OT - End of Session Activity Tolerance: Improving Endurance Deficit: Yes OT Assessment Rehab Potential (ACUTE ONLY): Excellent OT Patient demonstrates impairments in the following area(s):  Balance;Cognition;Safety;Perception;Motor OT Basic ADL's Functional Problem(s): Toileting;Dressing;Bathing;Grooming  OT Advanced ADL's Functional Problem(s): Simple Meal Preparation OT Transfers Functional Problem(s): Toilet OT Additional Impairment(s): Fuctional Use of Upper Extremity OT Plan OT Intensity: Minimum of 1-2 x/day, 45 to 90 minutes OT Frequency: 5 out of 7 days OT Duration/Estimated Length of Stay: 7-9 days OT Treatment/Interventions: Teacher, English as a foreign language;Discharge planning;Pain management;Self Care/advanced ADL retraining;Therapeutic Activities;UE/LE Coordination activities;Visual/perceptual remediation/compensation;Therapeutic Exercise;Patient/family education;Functional mobility training;Disease mangement/prevention;Cognitive remediation/compensation;Community reintegration;Neuromuscular re-education;Psychosocial support;UE/LE Strength taining/ROM OT Self Feeding Anticipated Outcome(s): independent OT Basic Self-Care Anticipated Outcome(s): Supervision OT Toileting Anticipated Outcome(s): Supervision OT Bathroom Transfers Anticipated Outcome(s): Supervision OT Recommendation Patient destination: Home Follow Up Recommendations: Home health OT;24 hour supervision/assistance Equipment Recommended: To be determined   OT Evaluation Precautions/Restrictions  Precautions Precautions: Fall Precaution Comments: L inattention Restrictions Weight Bearing Restrictions: No  Pain  No report of pain Home Living/Prior Functioning Home Living Living Arrangements: Children Available Help at Discharge: Family,Available 24 hours/day Type of Home: Apartment Home Access: Stairs to enter Entrance Stairs-Number of Steps: 2 Entrance Stairs-Rails: Left Home Layout: Two level,Bed/bath upstairs,1/2 bath on main level Alternate Level Stairs-Number of Steps: flight Alternate Level Stairs-Rails: Left Bathroom Shower/Tub: Therapist, art Accessibility: Yes  Lives With: Daughter IADL History Homemaking Responsibilities: Yes Meal Prep Responsibility: Primary Current License: No Occupation: Retired Prior Function Level of Independence: Independent with basic ADLs,Independent with homemaking with ambulation,Independent with gait,Independent with transfers  Able to Take Stairs?: Yes Driving: No Comments: Independent with ADLs/IADLs. Does not drive at baseline. Vision Baseline Vision/History: Wears glasses Wears Glasses: Reading only Patient Visual Report: Peripheral vision impairment Vision Assessment?: Yes Eye Alignment: Within Functional Limits Ocular Range of Motion: Within Functional Limits Alignment/Gaze Preference: Within Defined Limits Tracking/Visual Pursuits: Decreased smoothness of horizontal tracking;Decreased smoothness of vertical tracking Convergence: Within functional limits Visual Fields:  (grossly intact with testing but noted in discussion with PT, pt with increased difficulty locating stair rail in the lower left visual field) Perception  Perception: Impaired Inattention/Neglect: Does not attend to left visual field Praxis Praxis: Impaired Praxis Impairment Details: Initiation Cognition Overall Cognitive Status: Impaired/Different from baseline Arousal/Alertness: Awake/alert Orientation Level: Person;Place;Situation Person: Oriented Place: Oriented Situation: Oriented Year: 2022 Month: May Day of Week: Incorrect (wednesday) Memory: Impaired Memory Impairment: Retrieval deficit;Storage deficit Immediate Memory Recall: Sock;Blue;Bed Memory Recall Sock: Not able to recall Memory Recall Blue: With Cue Memory Recall Bed: With Cue Attention: Sustained;Selective Sustained Attention: Appears intact Selective Attention: Impaired Selective Attention Impairment: Verbal basic;Functional basic Awareness: Impaired Awareness Impairment: Emergent impairment Problem Solving:  Impaired Problem Solving Impairment: Functional basic Executive Function: Reasoning Reasoning: Impaired Reasoning Impairment: Verbal complex Safety/Judgment: Impaired Sensation Sensation Light Touch: Appears Intact Hot/Cold: Appears Intact Proprioception: Appears Intact Stereognosis: Appears Intact Coordination Gross Motor Movements are Fluid and Coordinated: No Fine Motor Movements are Fluid and Coordinated: No Coordination and Movement Description: Slight decreased gross and FM coordinaton in he LUE compared to the dominant right. Motor  Motor Motor: Hemiplegia;Abnormal postural alignment and control Motor - Skilled Clinical Observations: Mild L hemi with L inattention  Trunk/Postural Assessment  Cervical Assessment Cervical Assessment: Exceptions to WFL (slight cervical protraction in standing) Thoracic Assessment Thoracic Assessment: Exceptions to [facility] (thoracic rounding) Lumbar Assessment Lumbar Assessment: Exceptions to [facility] (posterior pelvic tilt)  Balance Balance Balance Assessed: Yes Static Sitting Balance Static Sitting - Balance Support: Feet supported Static Sitting - Level of Assistance: 5: Stand by assistance Dynamic Sitting Balance Dynamic Sitting - Balance Support: During functional activity;Feet supported Dynamic Sitting - Level of Assistance: 6: Modified independent (Device/Increase time) Static Standing  Balance Static Standing - Balance Support: Bilateral upper extremity supported Static Standing - Level of Assistance: 4: Min assist Dynamic Standing Balance Dynamic Standing - Balance Support: During functional activity;Bilateral upper extremity supported Dynamic Standing - Level of Assistance: 4: Min assist Extremity/Trunk Assessment RUE Assessment RUE Assessment: Within Functional Limits Active Range of Motion (AROM) Comments: WFLs grossly for all movements General Strength Comments: 3+/5 shoulder flexion with 4/5 for all other joints LUE  Assessment LUE Assessment: Exceptions to [facility] Passive Range of Motion (PROM) Comments: WFLs Active Range of Motion (AROM) Comments: WFLs General Strength Comments: shoulder flexion 3+/5 all other joints 4/5.  Slight decreased FM coordination compared to the right  Care Tool Care Tool Self Care Eating        Oral Care    Oral Care Assist Level: Supervision/Verbal cueing    Bathing   Body parts bathed by patient: Right arm;Left arm;Chest;Abdomen;Front perineal area;Buttocks;Right upper leg;Left upper leg;Right lower leg;Left lower leg;Face     Assist Level: Minimal Assistance - Patient > 75%    Upper Body Dressing(including orthotics)   What is the patient wearing?: Pull over shirt   Assist Level: Minimal Assistance - Patient > 75%    Lower Body Dressing (excluding footwear)   What is the patient wearing?: Underwear/pull up;Pants Assist for lower body dressing: Moderate Assistance - Patient 50 - 74%    Putting on/Taking off footwear   What is the patient wearing?: Non-skid slipper socks;Ted hose Assist for footwear: Moderate Assistance - Patient 50 - 74%       Care Tool Toileting Toileting activity   Assist for toileting: Minimal Assistance - Patient > 75%     Care Tool Bed Mobility Roll left and right activity        Sit to lying activity        Lying to sitting edge of bed activity         Care Tool Transfers Sit to stand transfer   Sit to stand assist level: Minimal Assistance - Patient > 75%    Chair/bed transfer   Chair/bed transfer assist level: Minimal Assistance - Patient > 75%     Toilet transfer         Care Tool Cognition Expression of Ideas and Wants Expression of Ideas and Wants: Some difficulty - exhibits some difficulty with expressing needs and ideas (e.g, some words or finishing thoughts) or speech is not clear   Understanding Verbal and Non-Verbal Content Understanding Verbal and Non-Verbal Content: Usually understands - understands most  conversations, but misses some part/intent of message. Requires cues at times to understand   Memory/Recall Ability *first 3 days only      Refer to Care Plan for Long Term Goals  SHORT TERM GOAL WEEK 1 OT Short Term Goal 1 (Week 1): Short term goals equal to LTGs set at supervision level overall.  Recommendations for other services: None    Skilled Therapeutic Intervention ADL ADL Eating: Set up Where Assessed-Eating: Wheelchair Grooming: Supervision/safety Where Assessed-Grooming: Wheelchair Upper Body Bathing: Supervision/safety Where Assessed-Upper Body Bathing: Chair;Shower Lower Body Bathing: Minimal assistance Where Assessed-Lower Body Bathing: Chair;Shower Upper Body Dressing: Minimal assistance Where Assessed-Upper Body Dressing: Wheelchair Lower Body Dressing: Minimal assistance Where Assessed-Lower Body Dressing: Wheelchair Toileting: Minimal assistance Where Assessed-Toileting: Bedside Commode Toilet Transfer: Minimal assistance Armed forces technical officer Method: Counselling psychologist: Grab bars;Raised toilet seat Social research officer, government: Minimal assistance Social research officer, government Method: Print production planner with back Mobility  Transfers Sit to Stand:  Minimal Assistance - Patient > 75% Stand to Sit: Minimal Assistance - Patient > 75%   Session Note:  Pt in wheelchair to start session.  Noted slightly flat affect with decreased initiation at times during session.  She was agreeable to completion of shower when given a choice between it and the sponge bath.  She was able to ambulate with min assist to the shower without an assistive device.  Mod instructional cueing to initiate removal of clothing with min assist overall and min instructional cueing for initiation and sequencing of bathing sit to stand.  She transferred over to the 3:1 in the shower for dressing tasks.  Increased difficulty with placing the RLE in the correct pants  leg.  After placing the LLE in, she tried to place the right in the same spot, without orienting the pant leg.  Therapist assisted with TEDs and then the pt donned her gripper socks with overall min assist.  Finished session with ambulation back out to the wheelchair where she was left with the call button and phone in reach and safety belt in place.      Discharge Criteria: Patient will be discharged from OT if patient refuses treatment 3 consecutive times without medical reason, if treatment goals not met, if there is a change in medical status, if patient makes no progress towards goals or if patient is discharged from hospital.  The above assessment, treatment plan, treatment alternatives and goals were discussed and mutually agreed upon: by patient  Roel Douthat OTR/L 09/17/2020, 8:50 PM

## 2020-09-17 NOTE — Progress Notes (Signed)
Inpatient Rehabilitation Center Individual Statement of Services  Patient Name:  [name]  Date:  09/17/2020  Welcome to the Inpatient Rehabilitation Center.  Our goal is to provide you with an individualized program based on your diagnosis and situation, designed to meet your specific needs.  With this comprehensive rehabilitation program, you will be expected to participate in at least 3 hours of rehabilitation therapies Monday-Friday, with modified therapy programming on the weekends.  Your rehabilitation program will include the following services:  Physical Therapy (PT), Occupational Therapy (OT), Speech Therapy (ST), 24 hour per day rehabilitation nursing, Therapeutic Recreaction (TR), Neuropsychology, Care Coordinator, Rehabilitation Medicine, Nutrition Services, Pharmacy Services and Other  Weekly team conferences will be held on Wednesday to discuss your progress.  Your Inpatient Rehabilitation Care Coordinator will talk with you frequently to get your input and to update you on team discussions.  Team conferences with you and your family in attendance may also be held.  Expected length of stay: 10-14 Days  Overall anticipated outcome: MOD I/Supervision  Depending on your progress and recovery, your program may change. Your Inpatient Rehabilitation Care Coordinator will coordinate services and will keep you informed of any changes. Your Inpatient Rehabilitation Care Coordinator's name and contact numbers are listed  below.  The following services may also be recommended but are not provided by the Inpatient Rehabilitation Center:    Home Health Rehabiltiation Services  Outpatient Rehabilitation Services   Arrangements will be made to provide these services after discharge if needed.  Arrangements include referral to agencies that provide these services.  Your insurance has been verified to be:  Medicare A & B Your primary doctor is:  NO PCP  Pertinent information will be  shared with your doctor and your insurance company.  Inpatient Rehabilitation Care Coordinator:  Lavera Guise, [place] [phone] or [phone]  Information discussed with and copy given to patient by: Andria Rhein, 09/17/2020, 9:17 AM

## 2020-09-18 MED ORDER — TOPIRAMATE 25 MG PO TABS
25.0000 mg | ORAL_TABLET | Freq: Two times a day (BID) | ORAL | Status: DC
  Administered 2020-09-18 – 2020-09-21 (×8): 25 mg via ORAL
  Filled 2020-09-18 (×9): qty 1

## 2020-09-18 NOTE — Progress Notes (Signed)
Occupational Therapy Session Note  Patient Details  Name: [name] MRN: [id number] Date of Birth: 1945-08-31  Today's Date: 09/18/2020 OT Individual Time: 0904-1000 OT Individual Time Calculation (min): 56 min    Short Term Goals: Week 1:  OT Short Term Goal 1 (Week 1): Short term goals equal to LTGs set at supervision level overall.  Skilled Therapeutic Interventions/Progress Updates:    Pt completed supine to sit EOB with min assist to begin session.  She then worked on donning scrub pants secondary to not having any clothing.  She was able to donn them sit to stand with min assist and increased time to donn the left pants leg.  She then completed functional mobility from the EOB to the bathroom for toileting tasks with min assist.  Min instructional cueing was needed for avoiding the door frame on the left with the RW.  She completed all aspects of toileting with min assist as well before ambulating out to the sink for grooming tasks of brushing her teeth and washing her hands.  She was then taken down to the therapy gym where she worked in standing on LUE coordination with use of the BITS.  She completed 3 intervals of 2 mins each in standing for Visual Pursuits Program.  The first was completed with an average reaction time of 3.9 seconds with 45% using her left index finger.  She then completed the next set with 92% accuracy at 3.2 seconds using a styles pointer.  She then completed the final set using the left index finger at 75% accuracy in an average of 2.9 seconds.  Noted decreased awareness of both the left superior and inferior areas of the board.  Mod instructional cueing for scanning the entire left side at times.  Finished session with return to the room via wheelchair where she was left with the call button and phone in reach and safety belt in place.      Therapy Documentation Precautions:  Precautions Precautions: Fall Precaution Comments: L inattention Restrictions Weight  Bearing Restrictions: No General:   Vital Signs:  Pain: Pain Assessment Pain Scale: Faces Pain Score: 0-No pain Faces Pain Scale: Hurts even more Pain Location: Head Pain Orientation: Left Pain Descriptors / Indicators: Constant ADL: ADL Eating: Set up Where Assessed-Eating: Wheelchair Grooming: Supervision/safety Where Assessed-Grooming: Wheelchair Upper Body Bathing: Supervision/safety Where Assessed-Upper Body Bathing: Chair,Shower Lower Body Bathing: Minimal assistance Where Assessed-Lower Body Bathing: Chair,Shower Upper Body Dressing: Minimal assistance Where Assessed-Upper Body Dressing: Wheelchair Lower Body Dressing: Minimal assistance Where Assessed-Lower Body Dressing: Wheelchair Toileting: Minimal assistance Where Assessed-Toileting: Bedside Commode Toilet Transfer: Minimal assistance Statistician Method: Proofreader: Grab bars,Raised toilet seat Film/video editor: Minimal assistance Film/video editor Method: Designer, industrial/product: Information systems manager with back Clinical research associate   Exercises:   Other Treatments:     Therapy/Group: Individual Therapy  Carlton Sweaney OTR/L 09/18/2020, 11:30 AM

## 2020-09-18 NOTE — Progress Notes (Signed)
PROGRESS NOTE   Subjective/Complaints:  Pt is slow to respond , c/o frontal HA since CVA, no hx migraines per pt Shoulder xray unremarkable except poss healed fx  ROS- neg CP SOB, N/V/D  Objective:   DG Shoulder Right  Result Date: 09/17/2020 CLINICAL DATA:  Right shoulder pain. EXAM: RIGHT SHOULDER - 2+ VIEW COMPARISON:  None. FINDINGS: Mild greater tuberosity hyperostosis. Mild acromial hyperostosis. Focal periosteal thickening in the proximal right humeral shaft, suggesting previous trauma. Otherwise, unremarkable bones and soft tissues. IMPRESSION: No acute abnormality. Electronically Signed   By: Beckie Salts M.D.   On: 09/17/2020 14:14   CT HEAD WO CONTRAST  Result Date: 09/16/2020 CLINICAL DATA:  Stroke follow-up EXAM: CT HEAD WITHOUT CONTRAST TECHNIQUE: Contiguous axial images were obtained from the base of the skull through the vertex without intravenous contrast. COMPARISON:  Yesterday FINDINGS: Brain: Subarachnoid and intraventricular hemorrhage is non progressed and similar in distribution. Nonprogressive right parietal infarct. No hydrocephalus, mass, or midline shift. Vascular: No hyperdense vessel or unexpected calcification. Skull: Normal. Negative for fracture or focal lesion. Sinuses/Orbits: No acute finding. IMPRESSION: No new abnormality or progression of intracranial hemorrhage and right cerebral infarction. Electronically Signed   By: Marnee Spring M.D.   On: 09/16/2020 10:58   Recent Labs    09/17/20 0516  WBC 5.8  HGB 10.8*  HCT 33.8*  PLT 183   Recent Labs    09/16/20 0943 09/17/20 0516  NA 136 137  K 3.4* 3.5  CL 105 106  CO2 23 24  GLUCOSE 153* 96  BUN 7* 12  CREATININE 0.84 0.76  CALCIUM 8.8* 9.0    Intake/Output Summary (Last 24 hours) at 09/18/2020 7371 Last data filed at 09/17/2020 1850 Gross per 24 hour  Intake 240 ml  Output --  Net 240 ml        Physical Exam: Vital  Signs Blood pressure (!) 126/50, pulse (!) 56, temperature 97.8 F (36.6 C), resp. rate 18, height 5\' 10"  (1.778 m), weight 87.7 kg, SpO2 100 %.   General: No acute distress Mood and affect are appropriate Heart: Regular rate and rhythm no rubs murmurs or extra sounds Lungs: Clear to auscultation, breathing unlabored, no rales or wheezes Abdomen: Positive bowel sounds, soft nontender to palpation, nondistended Extremities: No clubbing, cyanosis, or edema Skin: No evidence of breakdown, no evidence of rash  Neurologic: Cranial nerves II through XII intact, motor strength is 5/5 in bilateral deltoid, bicep, tricep, grip, hip flexor, knee extensors, ankle dorsiflexor and plantar flexor Sensory exam normal sensation to light touch and proprioception in bilateral upper and lower extremities Cerebellar exam normal finger to nose to finger as well as heel to shin in bilateral upper and lower extremities Musculoskeletal: Full range of motion in all 4 extremities. Pain with RIght shoulder abd, no deformity or crepitus , mild pain with Adduction as well, no pain with ext rotation    Assessment/Plan: 1. Functional deficits which require 3+ hours per day of interdisciplinary therapy in a comprehensive inpatient rehab setting.  Physiatrist is providing close team supervision and 24 hour management of active medical problems listed below.  Physiatrist and rehab team continue to assess barriers  to discharge/monitor patient progress toward functional and medical goals  Care Tool:  Bathing    Body parts bathed by patient: Right arm,Left arm,Chest,Abdomen,Front perineal area,Buttocks,Right upper leg,Left upper leg,Right lower leg,Left lower leg,Face         Bathing assist Assist Level: Minimal Assistance - Patient > 75%     Upper Body Dressing/Undressing Upper body dressing   What is the patient wearing?: Pull over shirt    Upper body assist Assist Level: Minimal Assistance - Patient > 75%     Lower Body Dressing/Undressing Lower body dressing      What is the patient wearing?: Underwear/pull up,Pants     Lower body assist Assist for lower body dressing: Moderate Assistance - Patient 50 - 74%     Toileting Toileting    Toileting assist Assist for toileting: Minimal Assistance - Patient > 75%     Transfers Chair/bed transfer  Transfers assist     Chair/bed transfer assist level: Minimal Assistance - Patient > 75%     Locomotion Ambulation   Ambulation assist      Assist level: Minimal Assistance - Patient > 75% Assistive device: Hand held assist Max distance: 10'   Walk 10 feet activity   Assist     Assist level: Minimal Assistance - Patient > 75% Assistive device: Walker-rolling   Walk 50 feet activity   Assist    Assist level: Minimal Assistance - Patient > 75% Assistive device: Walker-rolling    Walk 150 feet activity   Assist Walk 150 feet activity did not occur: Safety/medical concerns         Walk 10 feet on uneven surface  activity   Assist Walk 10 feet on uneven surfaces activity did not occur: Safety/medical concerns         Wheelchair     Assist Will patient use wheelchair at discharge?: No             Wheelchair 50 feet with 2 turns activity    Assist            Wheelchair 150 feet activity     Assist          Blood pressure (!) 126/50, pulse (!) 56, temperature 97.8 F (36.6 C), resp. rate 18, height 5\' 10"  (1.778 m), weight 87.7 kg, SpO2 100 %.    Medical Problem List and Plan: 1.  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  Status post right ICA revascularization             -patient may shower             -ELOS/Goals: 8-12 days/Supervision/Mod I             Admit to CIR 2.  Antithrombotics: -DVT/anticoagulation: SCDs              -antiplatelet therapy: Aspirin 81 mg daily, Brilinta 60 mg twice daily 3. Pain Management: Tylenol as needed, right shoulder pain mainly with abduction will check xray- no hx of fall per pt  4. Mood: Provide emotional support             -antipsychotic agents: N/A 5. Neuropsych: This patient is capable of making decisions on her own behalf. 6. Skin/Wound Care: Routine skin checks 7. Fluids/Electrolytes/Nutrition: Routine in and outs             CMP ordered  for tomorrow 8.  Hypertension.  Norvasc 2.5 mg daily.- increase to 5mg  starting today  Vitals:   09/17/20 2015 09/18/20 0430  BP: (!) 195/86 (!) 126/50  Pulse: (!) 59 (!) 56  Resp: 17 18  Temp: 98.1 F (36.7 C) 97.8 F (36.6 C)  SpO2: 100% 100%    labile will monitor              Monitor with increased mobility 9.  Hyperlipidemia: Lipitor 10.  History of COPD with remote tobacco use.               O2 Sats every shift             Questionable medical noncompliance.  Provide counseling 11.  Post CVA HA start topiramate LOS: 2 days A FACE TO FACE EVALUATION WAS PERFORMED  09/20/20 09/18/2020, 9:23 AM

## 2020-09-18 NOTE — Progress Notes (Signed)
Physical Therapy Session Note  Patient Details  Name: [name] MRN: [id number] Date of Birth: [birth year]  Today's Date: 09/18/2020 PT Individual Time: 0354-6568 PT Individual Time Calculation (min): 23 min   and Today's Date: 09/18/2020 PT Missed Time: 37 Minutes Missed Time Reason: Other (Comment) (meal)   And  Today's Date: 09/18/2020 PT Missed Time: 30 Minutes Missed Time Reason: Patient fatigue; Patient unwilling to participate   Short Term Goals: Week 1:  PT Short Term Goal 1 (Week 1): STG= LTG based on ELOS  Skilled Therapeutic Interventions/Progress Updates:    Session 1: Pt received in a deep sleep, supine in bed requiring increased stimulus to awaken Shanda Bumps, NT states pt has been sleeping like this today and yesterday and has been unable to awaken sufficiently to eat lunch. Pt agreeable to therapy session and upon questioning states need to use bathroom. Supine>sitting L EOB, HOB flat and bedrails available, pt initiates the task by attempting to crunch forward and come to sitting but was unsuccessful therefore requires cuing to sequence task and was able to perform with CGA. Pt demonstrates overall very slow movements with delayed processing. Sit>stand EOB>RW with mod assist to lift into standing and cuing to push up from bed. Gait training ~43ft in/out of bathroom using RW with CGA/min assist for balance - pt demos step-to gait pattern leading with R LE having very short L LE step length and poor foot clearance - also has L inattention requiring cuing to place L hand on RW and to avoid obstacles with walker on L side. Standing with CGA performed LB clothing management with min assist for L side with cuing to attend to that side and use L UE to complete task. Continent of bladder and performed seated peri-care without assist. Standing hand hygiene at sink with CGA. Pt requests to eat lunch at this time. Pt left seated in w/c with needs in reach, seat belt alarm on, and meal tray  set-up. Missed 37 minutes of skilled physical therapy.  Session 2: Pt received supine in bed asleep, awakens easier compared to earlier. Pt declines participation in therapy at this time stating she feels like she has "no energy." Therapist educated pt on post-stroke fatigue. Therapist provides encouragement and education on the importance of participation in therapy to address functional mobility impairments and reinforces that pt missed portion of earlier therapy session due to meal; however, pt continues to decline. Pt left supine in bed with needs in reach and bed alarm on. RN confirms that pt tolerated sitting upright, OOB in w/c for 2 hours after last session. Missed 30 minutes of skilled physical therapy.  Therapy Documentation Precautions:  Precautions Precautions: Fall Precaution Comments: L inattention Restrictions Weight Bearing Restrictions: No  Pain: Session 1: No reports of pain throughout session.  Therapy/Group: Individual Therapy  Ginny Forth , PT, DPT, CSRS  09/18/2020, 12:35 PM

## 2020-09-18 NOTE — Progress Notes (Signed)
Patient ID: [name], female   DOB: [birth year], 75 y.o.   MRN: [id number] Attempted to meet with the patietn to introduce self and the role of the nurse CM. Patient is very drowsy, and not up for education at this time. Patient given handouts on secondary risk factors including HTN, HLD (LDL 160). Also left handouts for management of dietary modification recommendations, exercise handbook,and a blood pressure handbook. Continue to follow along to discharge to address educational needs and collaborate with the SW to facilitate preparation for discharge. Pamelia Hoit

## 2020-09-18 NOTE — IPOC Note (Signed)
Overall Plan of Care [facility]) Patient Details Name: [name] MRN: [id number] DOB: 11/14/1945  Admitting Diagnosis: CVA (cerebral vascular accident) [facility])  Hospital Problems: Principal Problem:   CVA (cerebral vascular accident) [facility])     Functional Problem List: Nursing Bowel,Medication Management,Safety,Pain  PT Balance,Behavior,Endurance,Motor,Nutrition,Perception,Safety,Sensory,Skin Integrity  OT Balance,Cognition,Safety,Perception,Motor  SLP Cognition,Safety  TR         Basic ADL's: OT Toileting,Dressing,Bathing,Grooming     Advanced  ADL's: OT Simple Meal Preparation     Transfers: PT Bed Mobility,Bed to [employer]  OT Toilet     Locomotion: PT Ambulation,Stairs     Additional Impairments: OT Fuctional Use of Upper Extremity  SLP None      TR      Anticipated Outcomes Item Anticipated Outcome  Self Feeding independent  Swallowing  N/A   Basic self-care  Supervision  Toileting  Supervision   Bathroom Transfers Supervision  Bowel/Bladder  Manage B+B with mod I assist  Transfers  spv  Locomotion  spv (STAIRS cga)  Communication  N/A  Cognition  supervision safety/awareness/problem solving  Pain  Pain at or below level 4  Safety/Judgment  Maintain safety with cues/reminders   Therapy Plan: PT Intensity: Minimum of 1-2 x/day ,45 to 90 minutes PT Frequency: 5 out of 7 days PT Duration Estimated Length of Stay: 7-10 OT Intensity: Minimum of 1-2 x/day, 45 to 90 minutes OT Frequency: 5 out of 7 days OT Duration/Estimated Length of Stay: 7-9 days SLP Intensity: Minumum of 1-2 x/day, 30 to 90 minutes SLP Frequency: 3 to 5 out of 7 days SLP Duration/Estimated Length of Stay: 7-10 days   Due to the current state of emergency, patients may not be receiving their 3-hours of Medicare-mandated therapy.   Team Interventions: Nursing Interventions Patient/Family Education,Bowel Management,Disease Management/Prevention,Medication  Management,Discharge Planning,Pain Management  PT interventions Ambulation/gait training,Discharge planning,Functional mobility training,Psychosocial support,Therapeutic Activities,Visual/perceptual remediation/compensation,Balance/vestibular training,Disease management/prevention,Neuromuscular re-education,Skin care/wound management,Therapeutic Exercise,Wheelchair propulsion/positioning,Cognitive remediation/compensation,DME/adaptive equipment instruction,Pain management,Splinting/orthotics,UE/LE Strength taining/ROM,Community reintegration,Functional electrical stimulation,Patient/family education,Stair training,UE/LE Coordination activities  OT Interventions Balance/vestibular training,DME/adaptive equipment instruction,Discharge planning,Pain management,Self Care/advanced ADL retraining,Therapeutic Activities,UE/LE Coordination activities,Visual/perceptual remediation/compensation,Therapeutic Exercise,Patient/family education,Functional mobility training,Disease mangement/prevention,Cognitive remediation/compensation,Community reintegration,Neuromuscular re-education,Psychosocial support,UE/LE Strength taining/ROM  SLP Interventions Cognitive remediation/compensation,Medication managment,Patient/family education,Functional tasks  TR Interventions    SW/CM Interventions Discharge Planning,Psychosocial Support,Patient/Family Education,Disease Management/Prevention   Barriers to Discharge MD  Medical stability  Nursing Decreased caregiver support,Home environment access/layout,Medication compliance 2 level 2 step entry with B+B upstairs  left rail in home with dtr  PT Inaccessible home environment,Home environment access/layout,Behavior L inattention  OT      SLP Decreased caregiver support caregiver support depending on how much daughter (who patient lives with) is able to assist.  SW Medication compliance     Team Discharge Planning: Destination: PT-Home ,OT- Home , SLP-Home Projected  Follow-up: PT-Home health PT,Outpatient PT,24 hour supervision/assistance, OT-  Home health OT,24 hour supervision/assistance, SLP-Other (comment) (TBD) Projected Equipment Needs: PT-To be determined, OT- To be determined, SLP-None recommended by SLP Equipment Details: PT- , OT-  Patient/family involved in discharge planning: PT- Patient,  OT-Patient, SLP-Patient  MD ELOS: 8-12d Medical Rehab Prognosis:  Good Assessment:  75 year old right-handed female with history of COPD, DVT during pregnancy hypertension, thoracicoabdominal aortic aneurysm, quit smoking 33 years ago, question medical noncompliance.  History taken from chart review and patient. Patient lives with her family.  Independent prior to admission.  Two-level home bed and bath upstairs.  Independent ADLs.  She does not drive.  Presented on 09/10/2020 with dizziness and unsteady gait that persisted over 2-3  weeks as well as intermittent left-sided weakness.  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  No evidence of hemorrhage.  CT angiogram of head and neck age-indeterminate occlusion of the right cervical ICA just beyond its origin.  At least partial reconstitution at the clinoid intracranially.  Less than 50% stenosis of the left ICA origin.  Admission chemistries unremarkable except potassium 3.3. Patient underwent right ICA mechanical thrombectomy revascularization per interventional radiology 09/12/2020.  Currently maintained on DAPT aspirin 81 mg daily and Brilinta for CVA prophylaxis x3 months.  Cleviprex initially added for blood pressure control.  Patient developed increasing right hand pain and numbness follow-up CT of the head 09/15/2020 showed interval development of scattered SAH involving the right sylvian fissure and overlying right frontal cortical sulci.  Small volume intraventricular hemorrhage seen as well likely  related to redistribution.  No hydrocephalus or ventricular trapping.  Evolving acute to subacute right MCA distribution infarcts with follow-up neurology services and currently recommends to continue low-dose aspirin with Brilinta tolerating a regular diet   Now requiring 24/7 Rehab RN,MD, as well as CIR level PT, OT and SLP.  Treatment team will focus on ADLs and mobility with goals set at Supervision  See Team Conference Notes for weekly updates to the plan of care

## 2020-09-18 NOTE — Progress Notes (Signed)
Speech Language Pathology Daily Session Note  Patient Details  Name: [name] MRN: [id number] Date of Birth: [birth year]  Today's Date: 09/18/2020 SLP Individual Time: 1020-1100 SLP Individual Time Calculation (min): 40 min  Short Term Goals: Week 1: SLP Short Term Goal 1 (Week 1): Patient will perform simulated money/check/bank management tasks with minA cues for accuracy. SLP Short Term Goal 2 (Week 1): Patient will complete simulated weekly pill box task with supervision A for accuracy. SLP Short Term Goal 3 (Week 1): Patient will compensate for left visual field impairment with supervision A level. SLP Short Term Goal 4 (Week 1): Patient will recall specific exercises, strategies, tasks completed in daily PT, OT, ST sessions with minA.  Skilled Therapeutic Interventions:   Patient seen for skilled ST session focusing on cognitive goals. Patient sitting up in [facility] when SLP arrived but patient reporting that she was cold and wanted to get into bed. She was more receptive towards therapy today as compared to yesterday's evaluation. She told SLP she wanted to call her daughter but her phone was dead. SLP looked up her daughter's phone number and patient able to write down to dictation. She dialed number but was not able to hear when daughter answered. She then spoke with daughter and did recall PT/OT telling her to bring "some loose clothing" for therapy. Patient continues to demonstrate poor awareness to deficits during discussions. SLP provided verbal cues and minA for transfer from [facility] to bed with most cues for initiation and awareness for safety.  Pain Pain Assessment Pain Scale: 0-10 Pain Score: 0-No pain  Therapy/Group: Individual Therapy  Angela Nevin, MA, CCC-SLP Speech Therapy

## 2020-09-20 NOTE — Progress Notes (Signed)
PROGRESS NOTE   Subjective/Complaints:   No issues overnite, no HA this am but states she had one last noc Working with PT who has noted neglect of Left side   ROS- neg CP SOB, N/V/D  Objective:   No results found. No results for input(s): WBC, HGB, HCT, PLT in the last 72 hours. No results for input(s): NA, K, CL, CO2, GLUCOSE, BUN, CREATININE, CALCIUM in the last 72 hours.  Intake/Output Summary (Last 24 hours) at 09/20/2020 0803 Last data filed at 09/19/2020 1700 Gross per 24 hour  Intake 480 ml  Output --  Net 480 ml        Physical Exam: Vital Signs Blood pressure 134/61, pulse (!) 53, temperature 97.6 F (36.4 C), resp. rate 20, height 5\' 10"  (1.778 m), weight 87.7 kg, SpO2 96 %.   General: No acute distress Mood and affect are appropriate Heart: Regular rate and rhythm no rubs murmurs or extra sounds Lungs: Clear to auscultation, breathing unlabored, no rales or wheezes Abdomen: Positive bowel sounds, soft nontender to palpation, nondistended Extremities: No clubbing, cyanosis, or edema Skin: No evidence of breakdown, no evidence of rash Neurologic: Cranial nerves II through XII intact, motor strength is 5/5 R and 4/5 L deltoid, bicep, tricep, grip, 4/5 B hip flexor, knee extensors, ankle dorsiflexor and plantar flexor Sensory exam normal sensation to light touch and proprioception in bilateral upper and lower extremities Tactile extinction on Left with double simultaneous stimulation  Musculoskeletal: Full range of motion in all 4 extremities. Pain with RIght shoulder abd, no deformity or crepitus , mild pain with Adduction as well, no pain with ext rotation    Assessment/Plan: 1. Functional deficits which require 3+ hours per day of interdisciplinary therapy in a comprehensive inpatient rehab setting.  Physiatrist is providing close team supervision and 24 hour management of active medical problems listed  below.  Physiatrist and rehab team continue to assess barriers to discharge/monitor patient progress toward functional and medical goals  Care Tool:  Bathing    Body parts bathed by patient: Right arm,Left arm,Chest,Abdomen,Front perineal area,Buttocks,Right upper leg,Left upper leg,Right lower leg,Left lower leg,Face         Bathing assist Assist Level: Minimal Assistance - Patient > 75%     Upper Body Dressing/Undressing Upper body dressing   What is the patient wearing?: Pull over shirt    Upper body assist Assist Level: Minimal Assistance - Patient > 75%    Lower Body Dressing/Undressing Lower body dressing      What is the patient wearing?: Underwear/pull up,Pants     Lower body assist Assist for lower body dressing: Moderate Assistance - Patient 50 - 74%     Toileting Toileting    Toileting assist Assist for toileting: Minimal Assistance - Patient > 75%     Transfers Chair/bed transfer  Transfers assist     Chair/bed transfer assist level: Minimal Assistance - Patient > 75% Chair/bed transfer assistive device:   Ambulation assist      Assist level: Minimal Assistance - Patient > 75% Assistive device: Walker-rolling Max distance: 24ft   Walk 10 feet activity   Assist  Assist level: Minimal Assistance - Patient > 75% Assistive device: Walker-rolling   Walk 50 feet activity   Assist    Assist level: Minimal Assistance - Patient > 75% Assistive device: Walker-rolling    Walk 150 feet activity   Assist Walk 150 feet activity did not occur: Safety/medical concerns         Walk 10 feet on uneven surface  activity   Assist Walk 10 feet on uneven surfaces activity did not occur: Safety/medical concerns         Wheelchair     Assist Will patient use wheelchair at discharge?: No             Wheelchair 50 feet with 2 turns activity    Assist            Wheelchair 150  feet activity     Assist          Blood pressure 134/61, pulse (!) 53, temperature 97.6 F (36.4 C), resp. rate 20, height 5\' 10"  (1.778 m), weight 87.7 kg, SpO2 96 %.    Medical Problem List and Plan: 1.  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  Status post right ICA revascularization             -patient may shower             -ELOS/Goals: 8-12 days/Supervision/Mod I             Admit to CIR 2.  Antithrombotics: -DVT/anticoagulation: SCDs             -antiplatelet therapy: Aspirin 81 mg daily, Brilinta 60 mg twice daily 3. Pain Management: Tylenol as needed, right shoulder pain mainly with abduction will check xray- no hx of fall per pt  4. Mood: Provide emotional support             -antipsychotic agents: N/A 5. Neuropsych: This patient is capable of making decisions on her own behalf. 6. Skin/Wound Care: Routine skin checks 7. Fluids/Electrolytes/Nutrition: Routine in and outs             CMP ordered for tomorrow 8.  Hypertension.  Norvasc 2.5 mg daily.- increase to 5mg  starting today  Vitals:   09/19/20 1950 09/20/20 0331  BP: 123/61 134/61  Pulse: 63 (!) 53  Resp: 20 20  Temp: (!) 97.4 F (36.3 C) 97.6 F (36.4 C)  SpO2: 97% 96%  Controlled               Monitor with increased mobility 9.  Hyperlipidemia: Lipitor 10.  History of COPD with remote tobacco use.               O2 Sats every shift             Questionable medical noncompliance.  Provide counseling 11.  Post CVA HA start topiramate- 25mg  BID , cont to monitor on current dose  LOS: 4 days A FACE TO FACE EVALUATION WAS PERFORMED  09/21/20 09/20/2020, 8:03 AM

## 2020-09-20 NOTE — Progress Notes (Signed)
Occupational Therapy Session Note  Patient Details  Name: [name] MRN: [id number] Date of Birth: 1945-09-15  Today's Date: 09/20/2020 OT Individual Time: 1100-1155 and 1300-1340 OT Individual Time Calculation (min): 55 min and 40 min   Short Term Goals: Week 1:  OT Short Term Goal 1 (Week 1): Short term goals equal to LTGs set at supervision level overall.   Skilled Therapeutic Interventions/Progress Updates:    Pt greeted at time of session laying in bed sleeping, requiring extensive time and cues to wake up, often drifting back to sleep during conversation. Max verbal cues and tactile cues required to awaken, and performed bed mobility supine > sit Supervision and stand pivot > wheelchair CGA with RW. Pt transported to bathroom via wheelchair and stand pivot > toilet CGA and performed clothing management Min A, Supervision for seated hygiene for pericare. Stand pivot > wheelchair CGA and set up at sink for hand hygiene before transport to gym dependent for time. Focus on BITS x2 trials for visual scanning 1-26 letters with 46% accuracy, 10.98 reaction speed and using L hand only. Second activity for tracing 5/5 shapes with LUE only for NMR. Transported back to room and set up with alarm on call bell in reach.   Session 2: Pt greeted at time of session supine in bed sleeping but woken this time with min cues/mod encouragement to participate which is better than am session. Supine > sit Supervision and stand pivot to wheelchair CGA with RW. Transported to gym and focus on standing balance and body awareness through x3 trials of placing LLE on object corresponding to order given by therapist (up to 3 at a time) both with and without AD, trialing as well performing with RLE to weight bear through LLE for proprioceptive input. Transported back to room via wheelchair and stand pivot > bed no AD Min. Alarm on call bell in reach.   Therapy Documentation Precautions:  Precautions Precautions:  Fall Precaution Comments: L inattention Restrictions Weight Bearing Restrictions: No     Therapy/Group: Individual Therapy  Erasmo Score 09/20/2020, 7:26 AM

## 2020-09-20 NOTE — Progress Notes (Addendum)
Physical Therapy Session Note  Patient Details  Name: [name] MRN: [id number] Date of Birth: 1945-08-22  Today's Date: 09/20/2020 PT Individual Time: 0800-0900; 1515-1600 PT Individual Time Calculation (min): 60 min , 45 min  Short Term Goals: Week 1:  PT Short Term Goal 1 (Week 1): STG= LTG based on ELOS  Skilled Therapeutic Interventions/Progress Updates:  tx 1:  Pt awake, taking meds from RN.  She denied pain.  Pt's verbal processing slow, but pt compliant with tx.  She reported that she needed to use toilet.  Supine> sit using bed features, min assist for LLE.  Sit> stand to RW with CGA and cues for safety.  Gait training in congested area with RW to BR, min assist and max cues for keeping L foot within RW, progression of RW and attention to L.  Pt demonstrated step-to pattern.Toilet transfer to [facility] over toilet and using wall bar, CG/supervision.  Min assist for clothing mgt on L.  Pt spontaneously uses L hand , but briefly.  Hand washing at sink in standing with min assist for using soap dispenser.  Gait back to sit EOB, as above.  Pt sat EOB to eat breakfast with R hand.  neuromuscular re-education via forced use and multimodal cues ; in sitting EOB before going to BR- r/L long arc quad knee extension, R/L ankle pumps.  In standing on Kinetron with bil UE support, x 25 cycles x 3 at resistance 50 cm/sec, with min/mod assist for wt shifting to L ; mod cues for upright trunk and forward gaze.    Gait training on level tile x 80' , after use of Kinetron with significantly improved fluidity, attention to LLE, and step-through pattern without cues;  CGA with use of RW.  At end of session, pt declined staying in wc or resting in recliner.  Stand pivot transfer wc> bed to R without AD, CGA.  Sit> supine with cues for LLE.  Bed alarm set and needs left at hand.  tx 2:  Pt dozing in bed, but easily awakened. Pt agreeable to tx.  She denied pain.  Rolling L and sitting up with supervision  and cues for L attention.  Sit> stand from raised bed to RW unsuccessful x 2 trials, due to limited anterior wt shift.  Using Bobath method of clasped hands in front of her, no AD,  pt moved into standing easily.  Pivot transfer to wc with RW, close supervision.  neuromuscular re-education via multimodal cues, wt bearing, visual feedback for bil bridging, bil lower trunk rotation, alternating ankle pumps, cervical flexion, L straight leg raises.  Use of bil UEs to propel wc x 25' , with min assist due to limited push with LUE.   Therapeutic activity in standing in parallel bars,  Holding bil bars, kicking small cones with L/R foot.  Cues for L knee flexion for greater power kicking.    Gait training up/down 12 steps bil rails, min assist for progressing L hand.  Pt self-selected step -to pattern ascending and descending.  Pt unable to process visual and tactile cues from PT to step- through to next step.   At end of session, pt asked to return to bed.  PT strongly urged her to sit up in w/c or recliner, but pt declined.  She stated that she stayed in the bed most of the time at home, and was "bored, so bored".  Pt provided emotional support and briefly discussed her previous (outdoor gardening) hobbies with her, which  she has been unable to pursue in her current living situation with her dtr, in an apartment.  Bed alarm set and needs left at hand.       Therapy Documentation Precautions:  Precautions Precautions: Fall Precaution Comments: L inattention Restrictions Weight Bearing Restrictions: No       Therapy/Group: Individual Therapy  Tarence Searcy 09/20/2020, 10:19 AM

## 2020-09-21 DIAGNOSIS — I63511 Cerebral infarction due to unspecified occlusion or stenosis of right middle cerebral artery: Secondary | ICD-10-CM

## 2020-09-21 LAB — PLATELET INHIBITION P2Y12: Platelet Function  P2Y12: 10 [PRU] — ABNORMAL LOW (ref 182–335)

## 2020-09-21 MED ORDER — TICAGRELOR 90 MG PO TABS
45.0000 mg | ORAL_TABLET | Freq: Once | ORAL | Status: AC
  Administered 2020-09-21: 45 mg via ORAL
  Filled 2020-09-21: qty 1

## 2020-09-21 NOTE — Progress Notes (Signed)
Speech Language Pathology Daily Session Note  Patient Details  Name: [name] MRN: [id number] Date of Birth: [birth year]  Today's Date: 09/21/2020 SLP Individual Time: 1100-1145 SLP Individual Time Calculation (min): 45 min  Short Term Goals: Week 1: SLP Short Term Goal 1 (Week 1): Patient will perform simulated money/check/bank management tasks with minA cues for accuracy. SLP Short Term Goal 2 (Week 1): Patient will complete simulated weekly pill box task with supervision A for accuracy. SLP Short Term Goal 3 (Week 1): Patient will compensate for left visual field impairment with supervision A level. SLP Short Term Goal 4 (Week 1): Patient will recall specific exercises, strategies, tasks completed in daily PT, OT, ST sessions with minA.  Skilled Therapeutic Interventions: Skilled SLP intervention focused on cognition. Pt gave limited responses during session and needed Mod A verbal and visual cues for initiation to particiate in tasks. She was educated on purpose of pill organizer for medication management. Medications written for patient to review. She answered simple yes/no questions and simple problem solving questions regarding medications with max A. Cont with therapy per plan of care.      Pain Pain Assessment Pain Scale: Faces Faces Pain Scale: No hurt  Therapy/Group: Individual Therapy  Amil Amen A Kahner Yanik 09/21/2020, 11:40 AM

## 2020-09-21 NOTE — Progress Notes (Signed)
Physical Therapy Session Note  Patient Details  Name: [name] MRN: [id number] Date of Birth: Nov 08, 1945  Today's Date: 09/21/2020 PT Individual Time: 0800-0820 PT Individual Time Calculation (min): 20 min  and Today's Date: 09/21/2020 PT Missed Time: 55 Minutes Missed Time Reason: Patient unwilling to participate;Patient fatigue  Short Term Goals: Week 1:  PT Short Term Goal 1 (Week 1): STG= LTG based on ELOS  Skilled Therapeutic Interventions/Progress Updates:    Patient received reclined in bed, RN present to provide AM rx. She reports a headache, but did not rate. BP 125/60. RN providing pain rx. PT assisting patient to sit edge of bed with CGA and verbal cues to attend to L. RN admin rx with patient sitting up. Sitting edge of bed, patient able to attend to PT on the L, but continued to report headache. PT advising patient that she had just received pain rx and it will take some time to take effect. PT advising patient to ensure adequate hydration and PO intake to minimize occurrences of headaches. Patient had eaten <50% of her breakfast. Patient returning herself to supine stating "I need time." PT unable to encourage patient to participate in therapy at this time with patient citing headache as the reason. PT offering to return in ~15 mins to allow time for pain rx to work. Upon returning ~15 mins later, patient deep asleep and unable to be woken up despite multimodal cuing attempts. Patient remaining in bed, bed alarm on, call light within reach.   Therapy Documentation Precautions:  Precautions Precautions: Fall Precaution Comments: L inattention Restrictions Weight Bearing Restrictions: No    Therapy/Group: Individual Therapy  Elizebeth Koller, PT, DPT, CBIS  09/21/2020, 7:37 AM

## 2020-09-21 NOTE — Progress Notes (Signed)
PROGRESS NOTE   Subjective/Complaints: Sleepy Appreciate radiology note- recommended to decrease Brilinta to 45mg , has been changed Bradycardic  ROS- denies CP SOB, N/V/D  Objective:   No results found. No results for input(s): WBC, HGB, HCT, PLT in the last 72 hours. No results for input(s): NA, K, CL, CO2, GLUCOSE, BUN, CREATININE, CALCIUM in the last 72 hours. No intake or output data in the 24 hours ending 09/21/20 1256      Physical Exam: Vital Signs Blood pressure 125/60, pulse (!) 57, temperature 98.1 F (36.7 C), temperature source Oral, resp. rate 14, height 5\' 10"  (1.778 m), weight 87.7 kg, SpO2 100 %. Gen: no distress, normal appearing HEENT: oral mucosa pink and moist, NCAT Cardio: Reg rate Chest: normal effort, normal rate of breathing Abd: soft, non-distended Ext: no edema Psych: pleasant, normal affect Skin: intact Neurologic: Cranial nerves II through XII intact, motor strength is 5/5 R and 4/5 L deltoid, bicep, tricep, grip, 4/5 B hip flexor, knee extensors, ankle dorsiflexor and plantar flexor Sensory exam normal sensation to light touch and proprioception in bilateral upper and lower extremities Tactile extinction on Left with double simultaneous stimulation  Musculoskeletal: Full range of motion in all 4 extremities. Pain with RIght shoulder abd, no deformity or crepitus , mild pain with Adduction as well, no pain with ext rotation    Assessment/Plan: 1. Functional deficits which require 3+ hours per day of interdisciplinary therapy in a comprehensive inpatient rehab setting.  Physiatrist is providing close team supervision and 24 hour management of active medical problems listed below.  Physiatrist and rehab team continue to assess barriers to discharge/monitor patient progress toward functional and medical goals  Care Tool:  Bathing    Body parts bathed by patient: Right arm,Left  arm,Chest,Abdomen,Front perineal area,Buttocks,Right upper leg,Left upper leg,Right lower leg,Left lower leg,Face         Bathing assist Assist Level: Minimal Assistance - Patient > 75%     Upper Body Dressing/Undressing Upper body dressing   What is the patient wearing?: Pull over shirt    Upper body assist Assist Level: Minimal Assistance - Patient > 75%    Lower Body Dressing/Undressing Lower body dressing      What is the patient wearing?: Underwear/pull up,Pants     Lower body assist Assist for lower body dressing: Moderate Assistance - Patient 50 - 74%     Toileting Toileting    Toileting assist Assist for toileting: Minimal Assistance - Patient > 75%     Transfers Chair/bed transfer  Transfers assist     Chair/bed transfer assist level: Contact Guard/Touching assist Chair/bed transfer assistive device: 09/23/20   Ambulation assist      Assist level: Contact Guard/Touching assist Assistive device: Walker-rolling Max distance: 80   Walk 10 feet activity   Assist     Assist level: Contact Guard/Touching assist Assistive device: Walker-rolling   Walk 50 feet activity   Assist    Assist level: Contact Guard/Touching assist Assistive device: Walker-rolling    Walk 150 feet activity   Assist Walk 150 feet activity did not occur: Safety/medical concerns         Walk 10  feet on uneven surface  activity   Assist Walk 10 feet on uneven surfaces activity did not occur: Safety/medical concerns         Wheelchair     Assist Will patient use wheelchair at discharge?: No Type of Wheelchair: Manual    Wheelchair assist level: Minimal Assistance - Patient > 75% Max wheelchair distance: 25    Wheelchair 50 feet with 2 turns activity    Assist            Wheelchair 150 feet activity     Assist          Blood pressure 125/60, pulse (!) 57, temperature 98.1 F (36.7 C), temperature  source Oral, resp. rate 14, height 5\' 10"  (1.778 m), weight 87.7 kg, SpO2 100 %.    Medical Problem List and Plan: 1.  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  Status post right ICA revascularization             -patient may shower             -ELOS/Goals: 8-12 days/Supervision/Mod I             Continue CIR 2.  Antithrombotics: -DVT/anticoagulation: SCDs             -antiplatelet therapy: Aspirin 81 mg daily, Brilinta 60 mg twice daily 3. Pain Management: Tylenol as needed, right shoulder pain mainly with abduction will check xray- no hx of fall per pt  4. Mood: Provide emotional support             -antipsychotic agents: N/A 5. Neuropsych: This patient is capable of making decisions on her own behalf. 6. Skin/Wound Care: Routine skin checks 7. Fluids/Electrolytes/Nutrition: Routine in and outs. Electrolytes stable, monitor weekly 8.  Hypertension.  Norvasc 2.5 mg daily.- increase to 5mg  starting today, continue Vitals:   09/21/20 0444 09/21/20 0800  BP: 99/67 125/60  Pulse: (!) 57   Resp: 14   Temp: 98.1 F (36.7 C)   SpO2: 100%   Controlled               Monitor with increased mobility 9.  Hyperlipidemia: Lipitor 10.  History of COPD with remote tobacco use.               O2 Sats every shift             Questionable medical noncompliance.  Provide counseling 11.  Post CVA HA: continue topiramate- 25mg  BID , cont to monitor on current dose  LOS: 5 days A FACE TO FACE EVALUATION WAS PERFORMED  09/23/20 Abbygael Curtiss 09/21/2020, 12:56 PM

## 2020-09-21 NOTE — Progress Notes (Signed)
[facility].  P2Y12 this AM 10 PRU.  Discussed with Dr. Tommie Sams who recommends decreasing dose to Brilinta 45 mg once daily (continue with Aspirin 81 mg once daily) and recheck P2Y12 Friday 09/25/2020. Orders placed.  Please call [facility] with questions/concerns.   Waylan Boga Jenalyn Girdner, PA-C 09/21/2020, 12:21 PM

## 2020-09-21 NOTE — Progress Notes (Signed)
Occupational Therapy Session Note  Patient Details  Name: [name] MRN: [id number] Date of Birth: 11/22/1945  Today's Date: 09/21/2020 OT Individual Time: 1302-1400 OT Individual Time Calculation (min): 58 min    Short Term Goals: Week 1:  OT Short Term Goal 1 (Week 1): Short term goals equal to LTGs set at supervision level overall.  Skilled Therapeutic Interventions/Progress Updates:    Pt in bed to start session asleep.  Delayed response to therapist's questions with regard to what she wants to work on during session.  She was unable to appropriately respond to therapist's questions and continued to appear sleepy.  BP taken at 122/60.  Helped her transition to sitting EOB with max assist for initiation.  Once on the EOB she was able to maintain sitting balance with supervision.  Again therapist asked her if she would like to shower and she did not respond.  When therapist raised his voice and asked her to repeat the question he had asked, she was able to repeat it and respond "no" as her answer.  Had her complete stand pivot transfer to the wheelchair from the EOB with mod assist for initial standing.  Took her down to the therapy gym where she completed 2 sets of 1 min Visual Scanning Program on the [place].  She averaged 45-46% accuracy with an average time of 3.89- 4.3 seconds for each interval using the LUE.  Noted that this was slightly slower than Fridays testing, but no significantly.  Next, had her complete tub/shower transfer with use of the RW for support.  Mod assist needed for transfer with min assist needed to lift the LLE into and out of the shower.  She returned to the room at conclusion of task where she was left sitting up in the wheelchair to work on eating her lunch.  Call button and phone in reach with safety belt in place.  Nursing made aware of pt demonstrating slower processing and slower functioning overall compared to last week.  Will continue to monitor.      Therapy  Documentation Precautions:  Precautions Precautions: Fall Precaution Comments: L inattention Restrictions Weight Bearing Restrictions: No  Pain: Pain Assessment Pain Scale: Faces Pain Score: 0-No pain Pain Type: Acute pain Pain Orientation: Right Pain Descriptors / Indicators: Aching Pain Onset: With Activity Pain Intervention(s): Repositioned Multiple Pain Sites: No ADL: See Care Tool  Therapy/Group: Individual Therapy  Genene Kilman OTR/L 09/21/2020, 3:45 PM

## 2020-09-22 DIAGNOSIS — I6300 Cerebral infarction due to thrombosis of unspecified precerebral artery: Secondary | ICD-10-CM

## 2020-09-22 MED ORDER — TICAGRELOR 90 MG PO TABS
45.0000 mg | ORAL_TABLET | Freq: Every day | ORAL | Status: DC
  Administered 2020-09-22 – 2020-09-28 (×7): 45 mg via ORAL
  Filled 2020-09-22 (×7): qty 1

## 2020-09-22 MED ORDER — TOPIRAMATE 25 MG PO TABS
50.0000 mg | ORAL_TABLET | Freq: Two times a day (BID) | ORAL | Status: DC
  Administered 2020-09-22 – 2020-09-23 (×2): 50 mg via ORAL
  Filled 2020-09-22 (×3): qty 2

## 2020-09-22 NOTE — Progress Notes (Signed)
Occupational Therapy Session Note  Patient Details  Name: Artesia Berkey MRN: [id number] Date of Birth: Mar 14, 1946  Today's Date: 09/22/2020 OT Individual Time: 1500-1530 OT Individual Time Calculation (min): 30 min    Short Term Goals: Week 1:  OT Short Term Goal 1 (Week 1): Short term goals equal to LTGs set at supervision level overall.  Skilled Therapeutic Interventions/Progress Updates:    Pt in bed to start session awake but still slower to respond to verbal questions.  She was able to state place but not day of the week.  She was able to state why she was here but unable to state any differences in her functional performance now compared to before.  When asked, she did not answer and when therapist repeated it, her silence continued.  Had her transition over to the EOB with min assist and mod demonstrational cueing for sequencing bed mobility.  She then sat EOB while working on LUE coordination picking up small foam pieces and placing them into a styrofoam cup.  She was able to pick them up individually but did knock the cup over 2-3 times, having to redo some of the ones that had already been placed in.  Had her progress to in hand manipulation, picking up one piece at a time and translating to palm for picking up a second piece.  Then placing them both in the cup.  Initially, she dropped the one in her palm 4 out of the first 5 attempts, but improved as it was repeated approximately 15 times.  Returned to the bed at the end of the session with min assist to complete and with call button and phone in reach as well as safety alarm in place.    Therapy Documentation Precautions:  Precautions Precautions: Fall Precaution Comments: L inattention Restrictions Weight Bearing Restrictions: No  Pain: Pain Assessment Pain Scale: Faces Pain Score: 0-No pain ADL: See Care Tool Section for some details of mobility and selfcare  Therapy/Group: Individual Therapy  Adetokunbo Mccadden  OTR/L 09/22/2020, 4:08 PM

## 2020-09-22 NOTE — Progress Notes (Signed)
SLP Cancellation Note  Patient Details Name: [name] MRN: [id number] DOB: 1946/04/28   Cancelled treatment:      Pt sleeping and unable to arouse for SLP treatment. Attempted x2.                                                                                                   Amil Amen A Lilyian Quayle 09/22/2020, 9:18 AM

## 2020-09-22 NOTE — Consult Note (Signed)
Neuropsychological Consultation   Patient:   [name]   DOB:   [birth year]  MR Number:  [id number]  Location:  [facility] [address] CENTER [address] [id number] [place] [address] Dept: [phone] Loc: [phone]           Date of Service:   09/22/2020  Start Time:   1 PM End Time:   2 PM  Provider/Observer:  Arley Phenix, Psy.D.       Clinical Neuropsychologist       Billing Code/Service: [phone]  Chief Complaint:    [name] is a 75 year old right-handed female with history of COPD, DVT during pregnancy, hypertension, abdominal aortic aneurysm and question of medical compliance.  Patient presented on 09/10/2020 with dizziness and unsteady gait that persisted over the next 2 to 3 weeks as well as intermittent left-sided weakness.  CT/MRI showed right parietal small to moderate size acute to subacute infarct as well as few additional punctuate acute to subacute right syndrome semiovale infarcts.  There is no evidence of hemorrhagic process.  Patient underwent right ICA mechanical thrombectomy revascularization per IR on 09/12/2020.  Patient developed increasing right hand pain and numbness following CT of the head on 09/15/2020 and showed interval development of scattered subarachnoid hemorrhage involving the right sylvian fissure and overlying the right frontal cortical sulci.  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  Patient admitted to CIR due to ongoing left-sided hemiparesis and decreased functional mobility.  Reason for Service:  Patient was referred for neuropsychological consultation due to coping and adjustment issues following CVA.  Below see HPI for the current admission.  HPI: [name] is a 75 year old right-handed female with history of COPD, DVT during pregnancy hypertension, thoracicoabdominal aortic aneurysm, quit smoking 33 years ago,  question medical noncompliance.  History taken from chart review and patient. Patient lives with her family.  Independent prior to admission.  Two-level home bed and bath upstairs.  Independent ADLs.  She does not drive.  Presented on 09/10/2020 with dizziness and unsteady gait that persisted over 2-3 weeks as well as intermittent left-sided weakness.  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  No evidence of hemorrhage.  CT angiogram of head and neck age-indeterminate occlusion of the right cervical ICA just beyond its origin.  At least partial reconstitution at the clinoid intracranially.  Less than 50% stenosis of the left ICA origin.  Admission chemistries unremarkable except potassium 3.3. Patient underwent right ICA mechanical thrombectomy revascularization per interventional radiology 09/12/2020.  Currently maintained on DAPT aspirin 81 mg daily and Brilinta for CVA prophylaxis x3 months.  Cleviprex initially added for blood pressure control.  Patient developed increasing right hand pain and numbness follow-up CT of the head 09/15/2020 showed interval development of scattered SAH involving the right sylvian fissure and overlying right frontal cortical sulci.  Small volume intraventricular hemorrhage seen as well likely related to redistribution.  No hydrocephalus or ventricular trapping.  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  Due to patient's left-sided hemiparesis decreased functional mobility was admitted for a comprehensive rehab program. Please see preadmission assessment from earlier today as well.   Current Status:  Upon entering the room, the patient was sitting up in her bed  eating her lunch very slowly.  I introduced myself and  informed the patient as to why I was there.  The patient displayed very slow response style, extremely flat affect and little acknowledgment that she understood or comprehended the reason for my visit.  When asked if the patient understood what all that happened to her and had been explained to her the patient stated yes but could not identify anything beyond that she had had a stroke.  When questions were asked directly the patient she would stare at me directly for 30 to 45 seconds and without answering on most occasions would turn back to eating her food.  Little initiation and the patient she was having very slowed and deliberate motor functioning related to eating her dessert and had left most of her lunch untouched.  The patient engaged in no self initiated behavior besides slowly eating her cake and nothing else.  Multiple attempts were to engage with the patient began discussing.  When asked basic orientation questions and the patient would not answer.  She did not appear to be overly groggy or drowsy but very lethargic and lacking any self-directed behaviors beyond eating.  There were no verbal responses from the patient beyond single word answers on some occasions.  The patient did appear to understand where she was and that she was in the hospital following a stroke but little understanding her interaction beyond that.  Behavioral Observation: [name]  presents as a 76 y.o.-year-old Right African American Female who appeared her stated age. her dress was Appropriate and she was Well Groomed and her manners were Appropriate, inappropriate to the situation.  her participation was indicative of Inattentive and Resistant behaviors.  There were physical disabilities noted.  she displayed an inappropriate level of cooperation and motivation.     Interactions:    Minimal Inattentive and Resistant  Attention:   abnormal and attention span appeared shorter than expected for age  Memory:   Unable to be  adequately assessed.;   Visuo-spatial:  not examined  Speech (Volume):  low  Speech:   normal; normal  Thought Process:  Circumstantial  Though Content:  WNL;   Orientation:   person, place and situation  Judgment:   Poor  Planning:   Poor  Affect:    Blunted, Flat and Lethargic  Mood:    Dysphoric  Insight:   Lacking  Intelligence:   normal  Medical History:   Past Medical History:  Diagnosis Date  . COPD (chronic obstructive pulmonary disease) (HCC)   . History of DVT (deep vein thrombosis)   . Hypertension   . Thoracoabdominal aortic aneurysm [facility])          Patient Active Problem List   Diagnosis Date Noted  . 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  . Intraventricular hemorrhage (HCC) 09/16/2020  . 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 CVA (cerebral vascular accident) (HCC) 09/16/2020  . History of COPD   . Dyslipidemia   . Benign essential HTN   . TIA (transient ischemic attack) 09/10/2020     Psychiatric History:  No prior psychiatric history noted  Family Med/Psych History: No family history on file.  Impression/DX:  [name] is a 75 year old right-handed female with history of COPD, DVT during pregnancy, hypertension, abdominal aortic aneurysm and question of medical compliance.  Patient presented on 09/10/2020 with dizziness and unsteady gait that persisted over the next 2 to 3 weeks as well as intermittent left-sided weakness.  CT/MRI showed right parietal small to moderate size acute to subacute infarct as well as few additional punctuate acute to subacute right syndrome semiovale infarcts.  There is no evidence of hemorrhagic process.  Patient underwent right ICA mechanical thrombectomy revascularization per IR on 09/12/2020.  Patient developed increasing right hand pain and numbness following CT of the head on 09/15/2020 and showed interval development of scattered subarachnoid  hemorrhage involving the right sylvian fissure and overlying the right frontal cortical sulci.  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  Patient admitted to CIR due to ongoing left-sided hemiparesis and decreased functional mobility.  Upon entering the room, the patient was sitting up in her bed eating her lunch very slowly.  I introduced myself and informed the patient as to why I was there.  The patient displayed very slow response style, extremely flat affect and little acknowledgment that she understood or comprehended the reason for my visit.  When asked if the patient understood what all that happened to her and had been explained to her the patient stated yes but could not identify anything beyond that she had had a stroke.  When questions were asked directly the patient she would stare at me directly for 30 to 45 seconds and without answering on most occasions would turn back to eating her food.  Little initiation and the patient she was having very slowed and deliberate motor functioning related to eating her dessert and had left most of her lunch untouched.  The patient engaged in no self initiated behavior besides slowly eating her cake and nothing else.  Multiple attempts were to engage with the patient began discussing.  When asked basic orientation questions and the patient would not answer.  She did not appear to be overly groggy or drowsy but very lethargic and lacking any self-directed behaviors beyond eating.  There were no verbal responses from the patient beyond single word answers on some occasions.  The patient did appear to understand where she was and that she was in the hospital following a stroke but little understanding her interaction beyond that.  There are clearly residual neuropsychological deficits for stroke.  Potential right frontal involvement affecting her initiation and self-directed behavior and affecting her  problem-solving and understanding/abstract reasoning.   Diagnosis:    Cerebrovascular accident (CVA) due to thrombosis of precerebral artery (HCC) - Plan: Ambulatory referral to Neurology  Right shoulder pain - Plan: DG Shoulder Right, DG Shoulder Right         Electronically Signed   _______________________ Arley Phenix, Psy.D. Clinical Neuropsychologist

## 2020-09-22 NOTE — Progress Notes (Signed)
Physical Therapy Session Note  Patient Details  Name: [name] MRN: [id number] Date of Birth: May 13, 1945  Today's Date: 09/22/2020 PT Individual Time: 1000-1100 PT Individual Time Calculation (min): 60 min   Short Term Goals: Week 1:  PT Short Term Goal 1 (Week 1): STG= LTG based on ELOS  Skilled Therapeutic Interventions/Progress Updates:    Patient received asleep in bed, initially difficult to wake. Ultimately patient agreeable to PT. She denied pain. PT requesting that patient don pants (as she was only in her brief)- patient blankly stared back at PT for ~30s before PT asked if patient knew what PT asked of her. She was able to repeat "you asked me to put on my pants on." Patient able to come sit edge of bed with CGA and verbal cues for initiation. Once sitting edge of bed, patient took roughly 12 mins to don pants including orienting pants, assuming figure-4 position, donning each leg into appropriate leg hole. She displayed significantly delayed processing to cues and was generally very hypokinetic. Sit > stand with MinA to complete pulling up pants. Once sitting on edge of bed again, patient requesting to eat breakfast. She ate ~30% of her meal, but was able to attend to food items on the L side of the plate/tray without additional cuing. Patient transferring to wc via stand pivot with RW and CGA. PT transporting patient in wc to therapy gym for time management and energy conservation. She was able to ambulate ~66ft with RW and CGA. Verbal cues to keep L LE within walker frame, especially when turning. Patient maintains very slow gait speed and fluctuating L step length. Patient negotiated 4 steps with R HR and MinA. Step- to pattern, very slow pace and verbal cues to ensure that L LE was completely on step before transferring weight onto it. Patient returning to room in wc, seatbelt alarm on, call light within reach.   Therapy Documentation Precautions:  Precautions Precautions:  Fall Precaution Comments: L inattention Restrictions Weight Bearing Restrictions: No    Therapy/Group: Individual Therapy  Elizebeth Koller, PT, DPT, CBIS  09/22/2020, 7:34 AM

## 2020-09-22 NOTE — Progress Notes (Signed)
PROGRESS NOTE   Subjective/Complaints:   No issues overnite , still with HA daytime and night time   ROS- denies CP SOB, N/V/D  Objective:   No results found. No results for input(s): WBC, HGB, HCT, PLT in the last 72 hours. No results for input(s): NA, K, CL, CO2, GLUCOSE, BUN, CREATININE, CALCIUM in the last 72 hours.  Intake/Output Summary (Last 24 hours) at 09/22/2020 0759 Last data filed at 09/21/2020 1841 Gross per 24 hour  Intake 140 ml  Output --  Net 140 ml        Physical Exam: Vital Signs Blood pressure 140/65, pulse (!) 51, temperature 98.8 F (37.1 C), temperature source Oral, resp. rate 17, height 5\' 10"  (1.778 m), weight 87.7 kg, SpO2 98 %.  General: No acute distress Mood and affect are appropriate Heart: Regular rate and rhythm no rubs murmurs or extra sounds Lungs: Clear to auscultation, breathing unlabored, no rales or wheezes Abdomen: Positive bowel sounds, soft nontender to palpation, nondistended Extremities: No clubbing, cyanosis, or edema Skin: No evidence of breakdown, no evidence of rash  Neurologic: Cranial nerves II through XII intact, motor strength is 5/5 R and 4/5 L deltoid, bicep, tricep, grip, 4/5 B hip flexor, knee extensors, ankle dorsiflexor and plantar flexor Sensory exam normal sensation to light touch and proprioception in bilateral upper and lower extremities Tactile extinction on Left with double simultaneous stimulation  Musculoskeletal: Full range of motion in all 4 extremities. Pain with RIght shoulder abd, no deformity or crepitus , mild pain with Adduction as well, no pain with ext rotation    Assessment/Plan: 1. Functional deficits which require 3+ hours per day of interdisciplinary therapy in a comprehensive inpatient rehab setting.  Physiatrist is providing close team supervision and 24 hour management of active medical problems listed below.  Physiatrist and rehab  team continue to assess barriers to discharge/monitor patient progress toward functional and medical goals  Care Tool:  Bathing    Body parts bathed by patient: Right arm,Left arm,Chest,Abdomen,Front perineal area,Buttocks,Right upper leg,Left upper leg,Right lower leg,Left lower leg,Face         Bathing assist Assist Level: Minimal Assistance - Patient > 75%     Upper Body Dressing/Undressing Upper body dressing   What is the patient wearing?: Pull over shirt    Upper body assist Assist Level: Minimal Assistance - Patient > 75%    Lower Body Dressing/Undressing Lower body dressing      What is the patient wearing?: Underwear/pull up,Pants     Lower body assist Assist for lower body dressing: Moderate Assistance - Patient 50 - 74%     Toileting Toileting    Toileting assist Assist for toileting: Minimal Assistance - Patient > 75%     Transfers Chair/bed transfer  Transfers assist     Chair/bed transfer assist level: Moderate Assistance - Patient 50 - 74% Chair/bed transfer assistive device: assist      Assist level: Contact Guard/Touching assist Assistive device: Walker-rolling Max distance: 80   Walk 10 feet activity   Assist     Assist level: Contact Guard/Touching assist Assistive device: Walker-rolling   Walk  50 feet activity   Assist    Assist level: Contact Guard/Touching assist Assistive device: Walker-rolling    Walk 150 feet activity   Assist Walk 150 feet activity did not occur: Safety/medical concerns         Walk 10 feet on uneven surface  activity   Assist Walk 10 feet on uneven surfaces activity did not occur: Safety/medical concerns         Wheelchair     Assist Will patient use wheelchair at discharge?: No Type of Wheelchair: Manual    Wheelchair assist level: Minimal Assistance - Patient > 75% Max wheelchair distance: 25    Wheelchair 50 feet with 2  turns activity    Assist            Wheelchair 150 feet activity     Assist          Blood pressure 140/65, pulse (!) 51, temperature 98.8 F (37.1 C), temperature source Oral, resp. rate 17, height 5\' 10"  (1.778 m), weight 87.7 kg, SpO2 98 %.    Medical Problem List and Plan: 1.  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  Status post right ICA revascularization             -patient may shower             -ELOS/Goals: 8-12 days/Supervision/Mod I             Continue CIR 2.  Antithrombotics: -DVT/anticoagulation: SCDs             -antiplatelet therapy: Aspirin 81 mg daily, Brilinta 60 mg twice daily 3. Pain Management: Tylenol as needed, right shoulder pain mainly with abduction will check xray- no hx of fall per pt  4. Mood: Provide emotional support             -antipsychotic agents: N/A 5. Neuropsych: This patient is capable of making decisions on her own behalf. 6. Skin/Wound Care: Routine skin checks 7. Fluids/Electrolytes/Nutrition: Routine in and outs. Electrolytes stable, monitor weekly 8.  Hypertension.  Norvasc 2.5 mg daily.- increase to 5mg  starting today, continue Vitals:   09/21/20 1939 09/22/20 0515  BP: 137/60 140/65  Pulse: 64 (!) 51  Resp: 16 17  Temp: 98.5 F (36.9 C) 98.8 F (37.1 C)  SpO2: 99% 98%  Controlled  5/17             Monitor with increased mobility 9.  Hyperlipidemia: Lipitor 10.  History of COPD with remote tobacco use.               O2 Sats every shift             Questionable medical noncompliance.  Provide counseling 11.  Post CVA HA: continue topiramate- 25mg  BID , cont to monitor on current dose  LOS: 6 days A FACE TO FACE EVALUATION WAS PERFORMED  09/24/20 09/22/2020, 7:59 AM

## 2020-09-22 NOTE — Progress Notes (Signed)
Occupational Therapy Session Note  Patient Details  Name: [name] MRN: [id number] Date of Birth: November 03, 1945  Today's Date: 09/22/2020 OT Individual Time: 1115-1200 OT Individual Time Calculation (min): 45 min    Short Term Goals: Week 1:  OT Short Term Goal 1 (Week 1): Short term goals equal to LTGs set at supervision level overall.  Skilled Therapeutic Interventions/Progress Updates:      Pt seen for BADL retraining of toileting, bathing, and dressing with a focus on L side awareness.  Pt overall did fairly well needing min A with mobility and most of her self care.  But she did need more A with LB dressing due to poor processing and problem solving with how to get her feet into the pant legs.  She needed constant cues for safe ambulation to fully advance L foot in step pattern vs "dragging" it. During dressing, she needed several cues to fully attend to L side.  Pt returned to sit in wc with belt alarm on to prepare for lunch.  All needs met.    Therapy Documentation Precautions:  Precautions Precautions: Fall Precaution Comments: L inattention Restrictions Weight Bearing Restrictions: No    Vital Signs: Therapy Vitals Temp: 97.7 F (36.5 C) Pulse Rate: (!) 59 Resp: 17 BP: 125/61 Patient Position (if appropriate): Sitting Oxygen Therapy SpO2: 100 % O2 Device: Room Air Pain: Pain Assessment Pain Scale: Faces Pain Score: 0-No pain ADL: ADL Eating: Set up Where Assessed-Eating: Wheelchair Grooming: Supervision/safety Where Assessed-Grooming: Wheelchair Upper Body Bathing: Supervision/safety Where Assessed-Upper Body Bathing: Chair,Shower Lower Body Bathing: Minimal assistance Where Assessed-Lower Body Bathing: Chair,Shower Upper Body Dressing: Minimal assistance Where Assessed-Upper Body Dressing: Other (Comment) (toilet) Lower Body Dressing: Moderate assistance Where Assessed-Lower Body Dressing: Other (Comment) (toilet) Toileting: Minimal assistance Where  Assessed-Toileting: Glass blower/designer: Psychiatric nurse Method: Counselling psychologist: Grab bars,Raised Counselling psychologist: Environmental education officer Method: Heritage manager: Shower seat with back   Therapy/Group: Individual Therapy  [place] 09/22/2020, 4:10 PM

## 2020-09-23 MED ORDER — MODAFINIL 100 MG PO TABS
100.0000 mg | ORAL_TABLET | Freq: Every day | ORAL | Status: DC
  Administered 2020-09-23 – 2020-09-28 (×6): 100 mg via ORAL
  Filled 2020-09-23 (×6): qty 1

## 2020-09-23 NOTE — Progress Notes (Addendum)
Physical Therapy Session Note  Patient Details  Name: [name] MRN: [id number] Date of Birth: 1945-11-04  Today's Date: 09/23/2020 PT Individual Time: 0800-0845 PT Individual Time Calculation (min): 45 min; missed 15 mins due to unwilling to participate   Short Term Goals: Week 1:  PT Short Term Goal 1 (Week 1): STG= LTG based on ELOS  Skilled Therapeutic Interventions/Progress Updates:    Session 1: Patient received asleep in bed, easy to wake, but she would frequently fall back asleep. Patient denied pain. She was able to come sit edge of bed to don pants with CGA and Max verbal cues for initiation and continuance of movement. PT questions poor effort vs poor motor planning. Once sitting edge of bed, RN in for AM rx. Patient then required Max verbal cues and CGA to don pants completing in standing with CGA to come to stand. Patient taking ~7 mins to don pants, which is an improvement from yesterday, but still incredibly hypokinetic. Patient reporting need to go to the bathroom. She ambulated to bathroom with RW and CGA with verbal cues for larger L step, increased L foot clearance and cues to keep L foot within walker frame. Patient completing clothing management in standing with CGA. Continent of bladder. Patient denied need to have a bowel movement, but sat on the toilet for ~10 mins despite PTs attempts to encourage patient to complete perihygiene and return to wc to participate in therapy. PT questions poor initiation vs resistance to participate. Perihygiene ultimately completed seated with supervision. CGA to come to stand from toilet and ambulate back to wc. Patient requesting to eat breakfast. Once tray was placed in front of patient she did not initiate eating. PT encouraging patient to eat. Patient simply staring back at PT. Patient left up in wc, seatbelt alarm on, call light within reach, breakfast tray set up in front of her.   Session 2:  Therapy Documentation Precautions:   Precautions Precautions: Fall Precaution Comments: L inattention Restrictions Weight Bearing Restrictions: No    Therapy/Group: Individual Therapy  Elizebeth Koller, PT, DPT, CBIS  09/23/2020, 7:36 AM

## 2020-09-23 NOTE — Progress Notes (Signed)
Speech Language Pathology Daily Session Note  Patient Details  Name: [name] MRN: [id number] Date of Birth: 31-Jul-1945  Today's Date: 09/23/2020 SLP Individual Time: 0930-1030 SLP Individual Time Calculation (min): 60 min  Short Term Goals: Week 1: SLP Short Term Goal 1 (Week 1): Patient will perform simulated money/check/bank management tasks with minA cues for accuracy. SLP Short Term Goal 2 (Week 1): Patient will complete simulated weekly pill box task with supervision A for accuracy. SLP Short Term Goal 3 (Week 1): Patient will compensate for left visual field impairment with supervision A level. SLP Short Term Goal 4 (Week 1): Patient will recall specific exercises, strategies, tasks completed in daily PT, OT, ST sessions with minA.  Skilled Therapeutic Interventions: Skilled SLP intervention focused on cognition. Pt required max cues for participation for entire session. Problem solving task completed with coins/bills. She required max A with SLP directing patient through questions and showing pt coin amounts due to patient not counting coins using her own hands. Increased time needed to respond to questions. SLP often repeated information with visual cues while pt stared at coins. She increased accuracy with visual cues for calculations. Max encouragement for pt to respond to questions regarding tasks completed in PT session this morning. She mainly responded to yes/no questions and stared at SLP in silence or responded with short vague answers. When asked if she is not feeling well or not understanding task instructions, pt responded "I feel fine." Cont therapy per plan of care. Request pt be seated in wheelchair for next session.      Pain Pain Assessment Pain Scale: Faces Faces Pain Scale: No hurt  Therapy/Group: Individual Therapy  Carlean Jews Alvera Tourigny 09/23/2020, 10:22 AM

## 2020-09-23 NOTE — Progress Notes (Signed)
Patient ID: [name], female   DOB: [birth year], 75 y.o.   MRN: [id number]   Family education scheduled Sunday, May 22 9-11AM with pt dtr.  [place], [place] [phone]

## 2020-09-23 NOTE — Patient Care Conference (Signed)
Inpatient RehabilitationTeam Conference and Plan of Care Update Date: 09/23/2020   Time: 10:43 AM    Patient Name: [name]      Medical Record Number: [id number]  Date of Birth: 1945-09-24 Sex: Female         Room/Bed: 4M09C/4M09C-01 Payor Info: Payor: MEDICARE / Plan: MEDICARE PART A AND B / Product Type: *No Product type* /    Admit Date/Time:  09/16/2020  3:18 PM  Primary Diagnosis:  CVA (cerebral vascular accident) [facility])  Hospital Problems: Principal Problem:   CVA (cerebral vascular accident) [facility])    Expected Discharge Date: Expected Discharge Date: 09/28/20  Team Members Present: Physician leading conference: Dr. Claudette Laws Care Coodinator Present: Chana Bode, RN, BSN, CRRN;Christina Huey, BSW Nurse Present: Chana Bode, RN PT Present: Merry Lofty, PT OT Present: Perrin Maltese, OT SLP Present: Other (comment) Fae Pippin, SLP) PPS Coordinator present : Fae Pippin, SLP     Current Status/Progress Goal Weekly Team Focus  Bowel/Bladder   Continent of bowel/bladder. LBM 09/20/2020 (requires encouragment to toilet during night as patient will not call.)  Remain continent.  Assess qshift and PRN. toilet Q3h during hs.   Swallow/Nutrition/ Hydration             ADL's   Supervision for UB bathing with min assist for UB dressing.  She was able to complete LB bathing at min assist with LB dressing.  Min assist for transfers with use of the RW.  Left neglect with left visual field deficit.  LUE functions at a diminished level  supervision overall  selfcare retraining, transfer training, DME education, neuromuscular re-education, balance retraining,   Mobility   CGA bed mob, CGA/MinA STS/ stand pivot with RW, CGA gait up to 53ft with RW, steps x4 with R HR MinA, would benefit from stimulant to allow for more participation in therapy, will need fam ed prior to dc  SPV overall, likely need to be downgraded  activity tolerance, alertness, bed mobility,  transfers, gait progressions, stairs, pt ed, fam ed, dc planning   Communication             Safety/Cognition/ Behavioral Observations  min-mod A, limited by fatigue, needs encouragement for participation.  Supervision  simple money management tasks, understanding medications.recall of rehab information.   Pain   C/o headaches. PRN and scheduled medications administered.  Pain <3/10.  Assess Qshift and PRN   Skin   Closed incision by groin. Remainder of skin intact.  Maintain skin integrity.  Assess Qshift and PRN.     Discharge Planning:  Discharging home with daughter to provide care 24/7   Team Discussion: Team noted decline is cognition level since eval. Md noted latency of response and cognitive issues are component of CVA. Topamax increased for HA control however noted increased cognitive issues; MD to discontinue and start provigil. BP is controlled. Continue to note left inattention, field cut but has functional use of the left hand and requires cues for attention to hand during usage.  Patient on target to meet rehab goals: Currently supervision for upper body care ,  And min assist lower body bathing and mod assist for lower body dressing. CGA for sit- stand, able to ambulate and manage 12 steps with min assist and a RW. Supervision goals set for discharge.  *See Care Plan and progress notes for long and short-term goals.   Revisions to Treatment Plan:  SLP working on recall, medication management Teaching Needs: Transfers, safety, toileting, medications, etc.   Current  Barriers to Discharge: Decreased caregiver support, Home enviroment access/layout and Behavior  Possible Resolutions to Barriers: Family education set up for 09/25/20 [facility] follow up services recommended     Medical Summary Current Status: HA improving, severe cognitive deficits with slowed responses, BP controlled  Barriers to Discharge: Medical stability   Possible Resolutions to Barriers/Weekly Focus:  adjust topamax   Continued Need for Acute Rehabilitation Level of Care: The patient requires daily medical management by a physician with specialized training in physical medicine and rehabilitation for the following reasons: Direction of a multidisciplinary physical rehabilitation program to maximize functional independence : Yes Medical management of patient stability for increased activity during participation in an intensive rehabilitation regime.: Yes Analysis of laboratory values and/or radiology reports with any subsequent need for medication adjustment and/or medical intervention. : Yes   I attest that I was present, lead the team conference, and concur with the assessment and plan of the team.   Chana Bode B 09/23/2020, 1:28 PM

## 2020-09-23 NOTE — Progress Notes (Signed)
PROGRESS NOTE   Subjective/Complaints:  HA better , appreciate neuropsych consult  ROS- denies CP SOB, N/V/D  Objective:   No results found. No results for input(s): WBC, HGB, HCT, PLT in the last 72 hours. No results for input(s): NA, K, CL, CO2, GLUCOSE, BUN, CREATININE, CALCIUM in the last 72 hours.  Intake/Output Summary (Last 24 hours) at 09/23/2020 0823 Last data filed at 09/22/2020 1801 Gross per 24 hour  Intake 355 ml  Output --  Net 355 ml        Physical Exam: Vital Signs Blood pressure (!) 135/58, pulse (!) 50, temperature 98 F (36.7 C), resp. rate 16, height 5' 10"  (1.778 m), weight 87.7 kg, SpO2 100 %.  General: No acute distress Mood and affect are appropriate Heart: Regular rate and rhythm no rubs murmurs or extra sounds Lungs: Clear to auscultation, breathing unlabored, no rales or wheezes Abdomen: Positive bowel sounds, soft nontender to palpation, nondistended Extremities: No clubbing, cyanosis, or edema Skin: No evidence of breakdown, no evidence of rash  Neurologic: Cranial nerves II through XII intact, motor strength is 5/5 R and 4/5 L deltoid, bicep, tricep, grip, 4/5 B hip flexor, knee extensors, ankle dorsiflexor and plantar flexor Sensory exam normal sensation to light touch and proprioception in bilateral upper and lower extremities Tactile extinction on Left with double simultaneous stimulation  Musculoskeletal: Full range of motion in all 4 extremities. Pain with RIght shoulder abd, no deformity or crepitus , mild pain with Adduction as well, no pain with ext rotation    Assessment/Plan: 1. Functional deficits which require 3+ hours per day of interdisciplinary therapy in a comprehensive inpatient rehab setting.  Physiatrist is providing close team supervision and 24 hour management of active medical problems listed below.  Physiatrist and rehab team continue to assess barriers to  discharge/monitor patient progress toward functional and medical goals  Care Tool:  Bathing    Body parts bathed by patient: Right arm,Left arm,Chest,Abdomen,Front perineal area,Buttocks,Right upper leg,Left upper leg,Face   Body parts bathed by helper: Right lower leg,Left lower leg     Bathing assist Assist Level: Minimal Assistance - Patient > 75%     Upper Body Dressing/Undressing Upper body dressing   What is the patient wearing?: Pull over shirt    Upper body assist Assist Level: Minimal Assistance - Patient > 75%    Lower Body Dressing/Undressing Lower body dressing      What is the patient wearing?: Underwear/pull up,Pants     Lower body assist Assist for lower body dressing: Moderate Assistance - Patient 50 - 74%     Toileting Toileting    Toileting assist Assist for toileting: Moderate Assistance - Patient 50 - 74%     Transfers Chair/bed transfer  Transfers assist     Chair/bed transfer assist level: Minimal Assistance - Patient > 75% Chair/bed transfer assistive device: Programmer, multimedia   Ambulation assist      Assist level: Minimal Assistance - Patient > 75% Assistive device: Walker-rolling Max distance: 87   Walk 10 feet activity   Assist     Assist level: Minimal Assistance - Patient > 75% Assistive device: Walker-rolling  Walk 50 feet activity   Assist    Assist level: Minimal Assistance - Patient > 75% Assistive device: Walker-rolling    Walk 150 feet activity   Assist Walk 150 feet activity did not occur: Safety/medical concerns         Walk 10 feet on uneven surface  activity   Assist Walk 10 feet on uneven surfaces activity did not occur: Safety/medical concerns         Wheelchair     Assist Will patient use wheelchair at discharge?: No Type of Wheelchair: Manual    Wheelchair assist level: Minimal Assistance - Patient > 75% Max wheelchair distance: 25    Wheelchair 50 feet  with 2 turns activity    Assist            Wheelchair 150 feet activity     Assist          Blood pressure (!) 135/58, pulse (!) 50, temperature 98 F (36.7 C), resp. rate 16, height 5' 10"  (1.778 m), weight 87.7 kg, SpO2 100 %.    Medical Problem List and Plan: 1.  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  Status post right ICA revascularization             -patient may shower             Team conference today please see physician documentation under team conference tab, met with team  to discuss problems,progress, and goals. Formulized individual treatment plan based on medical history, underlying problem and comorbidities.              Continue CIR PT, OT, SLP 2.  Antithrombotics: -DVT/anticoagulation: SCDs             -antiplatelet therapy: Aspirin 81 mg daily, Brilinta 60 mg twice daily 3. Pain Management: Tylenol as needed, right shoulder pain mainly with abduction will check xray- no hx of fall per pt  4. Mood: Provide emotional support             -antipsychotic agents: N/A 5. Neuropsych: This patient is capable of making decisions on her own behalf. 6. Skin/Wound Care: Routine skin checks 7. Fluids/Electrolytes/Nutrition: Routine in and outs. Electrolytes stable, monitor weekly 8.  Hypertension.  Norvasc 2.5 mg daily.- increase to 70m starting today, continue Vitals:   09/22/20 1920 09/23/20 0506  BP: (!) 134/59 (!) 135/58  Pulse: 67 (!) 50  Resp: 17 16  Temp: 99 F (37.2 C) 98 F (36.7 C)  SpO2: 100%   Controlled  5/18             Monitor with increased mobility 9.  Hyperlipidemia: Lipitor 10.  History of COPD with remote tobacco use.               O2 Sats every shift             Questionable medical noncompliance.  Provide counseling 11.  Post CVA HA: continue topiramate- 528mBID , cont to monitor  on current dose  LOS: 7 days A FACE TO FATorrey Valaria Kohut 09/23/2020, 8:23 AM

## 2020-09-23 NOTE — Progress Notes (Signed)
Patient ID: [name], female   DOB: December 02, 1945, 75 y.o.   MRN: [id number] Team Conference Report to Patient/Family  Team Conference discussion was reviewed with the patient and caregiver, including goals, any changes in plan of care and target discharge date.  Patient and caregiver express understanding and are in agreement.  The patient has a target discharge date of 09/28/20.  SW spoke with [name]), informed pt and dtr of team conference updates. Daughter aware of the medication concern and is aware of physician d/c'ing med. Str informed of pt current recommendations for home health and rolling walker. No additional questions or concerns, sw will continue to follow up.  Andria Rhein 09/23/2020, 1:13 PM  [phone]

## 2020-09-23 NOTE — Progress Notes (Signed)
Physical Therapy Session Note  Patient Details  Name: [name] MRN: [id number] Date of Birth: 1945/09/26  Today's Date: 09/23/2020 PT Individual Time: 4008-6761 PT Individual Time Calculation (min): 12 min  and Today's Date: 09/23/2020 PT Missed Time: 18 Minutes Missed Time Reason: Patient unwilling to participate  Short Term Goals: Week 1:  PT Short Term Goal 1 (Week 1): STG= LTG based on ELOS  Skilled Therapeutic Interventions/Progress Updates:    Patient received supine in bed, asleep and difficult to wake initially. Patient reports pain in R hand, which she states is "arthritic pain." She did not rate and did not respond to PT when asked if she wanted pain rx or other interventions. PT initially able to encourage patient to participate in therapy and come to sit edge of bed. Patient with significantly delayed processing and almost no initiation. Finally, patient pulling blanket back up over her head. When PT continued to encourage patient to participate in therapy and asked "will you come sit on the edge of the bed?" Patient simply responded "no" and then stopped responding to or acknowledging PT. Patient remaining in bed, bed alarm on, call light within reach.   Therapy Documentation Precautions:  Precautions Precautions: Fall Precaution Comments: L inattention Restrictions Weight Bearing Restrictions: No    Therapy/Group: Individual Therapy  Elizebeth Koller, PT, DPT, CBIS  09/23/2020, 11:24 AM

## 2020-09-23 NOTE — Progress Notes (Signed)
Occupational Therapy Weekly Progress Note  Patient Details  Name: [name] MRN: [id number] Date of Birth: January 22, 1946  Beginning of progress report period: Sep 17, 2020 End of progress report period: Sep 23, 2020  Today's Date: 09/23/2020 OT Individual Time: 6811-5726 OT Individual Time Calculation (min): 43 min    Pt is making steady progress with OT.  Currently, she needs min assist for LB selfcare sit to stand with supervision to min assist for UB selfcare in sitting.  She continues to exhibit left neglect as well as left visual field deficit, requiring min to mod instructional cueing to attend to the left side when ambulating with the RW or when positioning the left hand on the RW.  She continues to demonstrate decreased initiation as well as decreased sequencing of tasks requiring mod cueing at times with delays of 30-45 seconds before initiating a simple task such as bed mobility.  LUE function is currently at a diminished level with decreased speed and coordination noted when trying to pull up garments.  Feel overall her progress the past week has been slower secondary to her complaints of headache and some of the side effects of the medicine causing her cognition to be more significantly impaired.  Currently, she is scheduled to discharge on 5/23 with her daughter providing 24 hour assist.  Recommend continued CIR level therapy in order to progress toward supervision level goals.    Patient continues to demonstrate the following deficits: muscle weakness, muscle joint tightness and muscle paralysis, impaired timing and sequencing, abnormal tone, unbalanced muscle activation and decreased coordination, decreased attention to left and left side neglect, decreased initiation, decreased attention, decreased awareness, decreased problem solving, decreased safety awareness, decreased memory and delayed processing and decreased standing balance, decreased postural control, hemiplegia and decreased  balance strategies and therefore will continue to benefit from skilled OT intervention to enhance overall performance with BADL and Reduce care partner burden.  Patient progressing toward long term goals..  Continue plan of care.  OT Short Term Goals Week 2:  OT Short Term Goal 1 (Week 2): Short term goals equal to LTGs set at supervision level overall.  Skilled Therapeutic Interventions/Progress Updates:    Pt completed supine to sit EOB with mod assist and mod demonstrational cueing for initiation.  She was oriented to place this session and also reported needing to use the bathroom.  Min assist was needed for sit to stand from the EOB and for functional mobility to the bathroom.  She was able to complete all toilet hygiene and clothing management as well as ambulating out to the sink to wash her hands with supervision.  Next, she was taken down to the therapy gym via wheelchair where she completed stand pivot transfer to the therapy mat with min assist and no device.  Had her work on standing balance with integration of the LUE for picking up playing cards and matching them to the ones on the mirror.  Min instructional cueing with min guard assist to complete task without any UE support in static standing position.  She was able to maintain standing for 10 mins before resting.  Returned to the room at then end of session with min assist needed for stand pivot transfer to the bed with min assist to lift the LLE into the bed.  Call button and phone in reach with safety alarm in place.   Therapy Documentation Precautions:  Precautions Precautions: Fall Precaution Comments: L inattention Restrictions Weight Bearing Restrictions: No  Pain: Pain  Assessment Pain Scale: Faces Pain Score: 0-No pain ADL: See Care Tool Section for some details of mobility and selfcare  Therapy/Group: Individual Therapy  [name] OTR/L 09/23/2020, 4:25 PM

## 2020-09-24 DIAGNOSIS — I610 Nontraumatic intracerebral hemorrhage in hemisphere, subcortical: Secondary | ICD-10-CM

## 2020-09-24 NOTE — Progress Notes (Signed)
Occupational Therapy Session Note  Patient Details  Name: [name] MRN: [id number] Date of Birth: 07-12-1945  Today's Date: 09/24/2020 OT Individual Time: 1001-1101 OT Individual Time Calculation (min): 60 min    Short Term Goals: Week 2:  OT Short Term Goal 1 (Week 2): Short term goals equal to LTGs set at supervision level overall.  Skilled Therapeutic Interventions/Progress Updates:    Pt completed toileting, showering, and dressing during session.  She was able to complete supine to sit with mod assist secondary to decreased initiation of task.  Once sitting, she was able to complete functional mobility to the bathroom for use of the toilet with increased time and min assist.  Decreased efficiency noted when advancing the LLE.  She continues to try and complete peri care with the LUE, however she is not able to efficiently reach behind her with the hand while sitting.  Decreased emergent awareness, requiring mod instructional cueing to switch hands and integrate the non-involved RUE.  Next, she transferred over to the walk-in shower with mod assist for lining up to the seat.  She was able to complete all bathing with min assist and mod instructional cueing to wash her arms and her feet.  Once complete, she transferred out to the wheelchair for work on dressing.  Min assist for donning pullover shirt with pt initially trying to donn it with the shirt backwards without awareness that it needed to be re-oriented.  Next, she needed mod assist for donning brief, pants, and gripper socks.  Min assist for sit to stand from the wheelchair to pull items over the hips.  Finished session with call button and phone in reach and safety belt in place.    Therapy Documentation Precautions:  Precautions Precautions: Fall Precaution Comments: L inattention Restrictions Weight Bearing Restrictions: No  Pain: Pain Assessment Pain Scale: Faces Faces Pain Scale: No hurt Pain Type: Acute pain Pain  Location: Head Pain Orientation: Right Pain Descriptors / Indicators: Headache ADL: See Care Tool Section for some details of mobility and selfcare  Therapy/Group: Individual Therapy  Denene Alamillo OTR/L 09/24/2020, 12:51 PM

## 2020-09-24 NOTE — Progress Notes (Signed)
Speech Language Pathology Daily Session Note  Patient Details  Name: [name] MRN: [id number] Date of Birth: 04-25-1946  Today's Date: 09/24/2020 SLP Individual Time: 0920-1000 SLP Individual Time Calculation (min): 40 min  Short Term Goals: Week 1: SLP Short Term Goal 1 (Week 1): Patient will perform simulated money/check/bank management tasks with minA cues for accuracy. SLP Short Term Goal 2 (Week 1): Patient will complete simulated weekly pill box task with supervision A for accuracy. SLP Short Term Goal 3 (Week 1): Patient will compensate for left visual field impairment with supervision A level. SLP Short Term Goal 4 (Week 1): Patient will recall specific exercises, strategies, tasks completed in daily PT, OT, ST sessions with minA.  Skilled Therapeutic Interventions: Skilled SLP intervention focused on cognition. Pt sleeping when slp entered room and was very sleepy for entire session. Some improvement in responses and initiation this session despite lethargy. Pt shown BID pill organizer and educated on purpose with explanation. She answered simple problem solving questions regarding where to place pills and times taken using medication list with mod-max A verbal cues and increased time to answer questions.  Mod A with tactile cues to use right hand during tasks this session. Cont with therapy per plan of care.      Pain Pain Assessment Pain Scale: Faces Pain Score: 5  Faces Pain Scale: No hurt Pain Type: Acute pain Pain Location: Head Pain Orientation: Right Pain Descriptors / Indicators: Headache Pain Frequency: Intermittent Pain Onset: Gradual Pain Intervention(s): Medication (See eMAR)  Therapy/Group: Individual Therapy  Carlean Jews Wynona Duhamel 09/24/2020, 9:59 AM

## 2020-09-24 NOTE — Progress Notes (Signed)
PROGRESS NOTE   Subjective/Complaints:  PT/OT noted pt less responsive in therapy , reviewed meds, on excalating dose of topiramate for HA, UA ordered although no fever or urinary c/os ROS- denies CP SOB, N/V/D  Objective:   No results found. No results for input(s): WBC, HGB, HCT, PLT in the last 72 hours. No results for input(s): NA, K, CL, CO2, GLUCOSE, BUN, CREATININE, CALCIUM in the last 72 hours. No intake or output data in the 24 hours ending 09/24/20 0721      Physical Exam: Vital Signs Blood pressure (!) 133/57, pulse (!) 48, temperature 97.7 F (36.5 C), temperature source Oral, resp. rate 20, height 5\' 10"  (1.778 m), weight 87.7 kg, SpO2 97 %.   General: No acute distress Mood and affect are appropriate Heart: Regular rate and rhythm no rubs murmurs or extra sounds Lungs: Clear to auscultation, breathing unlabored, no rales or wheezes Abdomen: Positive bowel sounds, soft nontender to palpation, nondistended Extremities: No clubbing, cyanosis, or edema Skin: No evidence of breakdown, no evidence of rash  Neurologic: Cranial nerves II through XII intact, motor strength is 5/5 R and 4/5 L deltoid, bicep, tricep, grip, 4/5 B hip flexor, knee extensors, ankle dorsiflexor and plantar flexor Sensory exam normal sensation to light touch and proprioception in bilateral upper and lower extremities Tactile extinction on Left with double simultaneous stimulation  Musculoskeletal: Full range of motion in all 4 extremities. Pain with RIght shoulder abd, no deformity or crepitus , mild pain with Adduction as well, no pain with ext rotation    Assessment/Plan: 1. Functional deficits which require 3+ hours per day of interdisciplinary therapy in a comprehensive inpatient rehab setting.  Physiatrist is providing close team supervision and 24 hour management of active medical problems listed below.  Physiatrist and rehab team  continue to assess barriers to discharge/monitor patient progress toward functional and medical goals  Care Tool:  Bathing    Body parts bathed by patient: Right arm,Left arm,Chest,Abdomen,Front perineal area,Buttocks,Right upper leg,Left upper leg,Face   Body parts bathed by helper: Right lower leg,Left lower leg     Bathing assist Assist Level: Minimal Assistance - Patient > 75%     Upper Body Dressing/Undressing Upper body dressing   What is the patient wearing?: Pull over shirt    Upper body assist Assist Level: Minimal Assistance - Patient > 75%    Lower Body Dressing/Undressing Lower body dressing      What is the patient wearing?: Underwear/pull up,Pants     Lower body assist Assist for lower body dressing: Moderate Assistance - Patient 50 - 74%     Toileting Toileting    Toileting assist Assist for toileting: Minimal Assistance - Patient > 75%     Transfers Chair/bed transfer  Transfers assist     Chair/bed transfer assist level: Minimal Assistance - Patient > 75% Chair/bed transfer assistive device:   Ambulation assist      Assist level: Minimal Assistance - Patient > 75% Assistive device: Walker-rolling Max distance: 87   Walk 10 feet activity   Assist     Assist level: Minimal Assistance - Patient > 75% Assistive device: Walker-rolling  Walk 50 feet activity   Assist    Assist level: Minimal Assistance - Patient > 75% Assistive device: Walker-rolling    Walk 150 feet activity   Assist Walk 150 feet activity did not occur: Safety/medical concerns         Walk 10 feet on uneven surface  activity   Assist Walk 10 feet on uneven surfaces activity did not occur: Safety/medical concerns         Wheelchair     Assist Will patient use wheelchair at discharge?: No Type of Wheelchair: Manual    Wheelchair assist level: Minimal Assistance - Patient > 75% Max wheelchair distance: 25     Wheelchair 50 feet with 2 turns activity    Assist            Wheelchair 150 feet activity     Assist          Blood pressure (!) 133/57, pulse (!) 48, temperature 97.7 F (36.5 C), temperature source Oral, resp. rate 20, height 5\' 10"  (1.778 m), weight 87.7 kg, SpO2 97 %.    Medical Problem List and Plan: 1.  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  Status post right ICA revascularization             -patient may shower                           Continue CIR PT, OT, SLP 2.  Antithrombotics: -DVT/anticoagulation: SCDs             -antiplatelet therapy: Aspirin 81 mg daily, Brilinta 60 mg twice daily 3. Pain Management: Tylenol as needed, right shoulder pain mainly with abduction will check xray- no hx of fall per pt  4. Mood: Provide emotional support             -antipsychotic agents: N/A 5. Neuropsych: This patient is capable of making decisions on her own behalf. 6. Skin/Wound Care: Routine skin checks 7. Fluids/Electrolytes/Nutrition: Routine in and outs. Electrolytes stable, monitor weekly 8.  Hypertension.  Norvasc 2.5 mg daily.- increase to 5mg  starting today, continue Vitals:   09/23/20 1957 09/24/20 0340  BP: (!) 131/55 (!) 133/57  Pulse: 63 (!) 48  Resp: 20 20  Temp: 98.5 F (36.9 C) 97.7 F (36.5 C)  SpO2: 99% 97%  Controlled  5/19             Monitor with increased mobility 9.  Hyperlipidemia: Lipitor 10.  History of COPD with remote tobacco use.               O2 Sats every shift             Questionable medical noncompliance.  Provide counseling 11.  Post CVA HA:off topiramatie no increased c/os use prn tylenol due to concerns about sedation   LOS: 8 days A FACE TO FACE EVALUATION WAS PERFORMED  09/26/20 09/24/2020, 7:21 AM

## 2020-09-24 NOTE — Plan of Care (Signed)
  Problem: RH Simple Meal Prep Goal: LTG Patient will perform simple meal prep w/assist (OT) Description: LTG: Patient will perform simple meal prep with assistance, with/without cues (OT). Outcome: Not Applicable

## 2020-09-24 NOTE — Progress Notes (Signed)
Patient ID: [name], female   DOB: 10-Apr-1946, 75 y.o.   MRN: [id number]   New patient appointment scheduled on October 20, 2020 at 10:30 AM with Hetty Blend, NP-C at [facility] [address] [place] [address] [phone]  Lavera Guise, [place] [phone]

## 2020-09-24 NOTE — Plan of Care (Signed)
  Problem: RH Balance Goal: LTG Patient will maintain dynamic standing balance (PT) Description: LTG:  Patient will maintain dynamic standing balance with assistance during mobility activities (PT) Flowsheets (Taken 09/24/2020 1829) LTG: Pt will maintain dynamic standing balance during mobility activities with:: (downgraded based on pt progress) Contact Guard/Touching assist Note: downgraded based on pt progress   Problem: RH Car Transfers Goal: LTG Patient will perform car transfers with assist (PT) Description: LTG: Patient will perform car transfers with assistance (PT). Flowsheets (Taken 09/24/2020 1829) LTG: Pt will perform car transfers with assist:: (downgraded based on pt progress and L inattention) Contact Guard/Touching assist Note: downgraded based on pt progress and L inattention   Problem: RH Stairs Goal: LTG Patient will ambulate up and down stairs w/assist (PT) Description: LTG: Patient will ambulate up and down # of stairs with assistance (PT) Flowsheets (Taken 09/24/2020 1829) LTG: Pt will ambulate up/down stairs assist needed:: Contact Guard/Touching assist LTG: Pt will  ambulate up and down number of stairs: 4 steps using B HRs

## 2020-09-24 NOTE — Progress Notes (Signed)
Physical Therapy Weekly Progress Note  Patient Details  Name: [name] MRN: [id number] Date of Birth: 22-Jun-1945  Beginning of progress report period: Sep 17, 2020 End of progress report period: Sep 24, 2020  Today's Date: 09/24/2020 PT Individual Time: 1123-1201 and 2993-7169 PT Individual Time Calculation (min): 38 min and 30 min  Patient has met 0 of 1 short term goals as none were set at initial evaluation based on ELOS at that time.  [name] has made slower than anticipated progress due to participation being impacted by increased lethargy from medications. She is able to perform supine<>sit with min assist, sit<>stands using RW with min assist, stand pivot transfers using RW with min assist, and ambulating up to 142f using RW with CGA and up to 2028fwithout AD requiring min assist, as well as navigating 4 steps using B HRs with min assist. She continues to demonstrate L inattention requiring cuing for navigating around obstacles and attending to L UE and LE. [name] demonstrates impaired initiation with hypokinesia requiring significantly increased time to begin and then complete a motor task as well as impaired problem solving when original motor plan is unsuccessful. Recommend continued CIR level therapies in order to progress towards supervision/CGA level functional mobility goals in preparation for D/C home with her daughter providing 24hr support.  Patient continues to demonstrate the following deficits muscle weakness, decreased cardiorespiratoy endurance, impaired timing and sequencing, abnormal tone, unbalanced muscle activation, decreased coordination and decreased motor planning, decreased visual perceptual skills and decreased visual motor skills, decreased attention to left, decreased initiation, decreased attention, decreased awareness, decreased problem solving, decreased safety awareness, decreased memory and delayed processing and decreased sitting balance, decreased  standing balance, decreased postural control, hemiplegia and decreased balance strategies and therefore will continue to benefit from skilled PT intervention to increase functional independence with mobility.  Patient not progressing toward long term goals.  See goal revision..  Continue plan of care.  PT Short Term Goals: Week 1:  PT Short Term Goal 1 (Week 1): STG= LTG based on ELOS PT Short Term Goal 1 - Progress (Week 1): Progressing toward goal Week 2:  PT Short Term Goal 1 (Week 2): = to LTGs based on ELOS  Skilled Therapeutic Interventions/Progress Updates:  Ambulation/gait training;Discharge planning;Functional mobility training;Psychosocial support;Therapeutic Activities;Visual/perceptual remediation/compensation;Balance/vestibular training;Disease management/prevention;Neuromuscular re-education;Skin care/wound management;Therapeutic Exercise;Wheelchair propulsion/positioning;Cognitive remediation/compensation;DME/adaptive equipment instruction;Pain management;Splinting/orthotics;UE/LE Strength taining/ROM;Community reintegration;Functional electrical stimulation;Patient/family education;Stair training;UE/LE Coordination activities   Session 1: Pt received sitting in w/c and agreeable to therapy session. Pt continues to demo impaired processing and initiation with delayed verbal and motor responses to questions and cuing throughout session.  Transported to/from gym in w/c for time management and energy conservation. Sit<>stands with and without RW requiring CGA for steadying due to decreased anterior weight shift when coming upright causing slight posterior LOB. Gait training 14169fsing RW with CGA - intermittent decreased L LE foot clearance and step length that occurs predominantly when turning or when distracted by other things in environment - cuing for safety throughout. Dynamic gait training, L attention, and L UE NMR via ambulating up/down hallway while visually scanning and grasping  sticky notes on wall in sequential order from A-F; pt able to recall which letter she was searching for next without cuing - requires intermittent cuing to step L LE because would get it caught outside of the RW. Stair navigation training via ascending/descending 4 steps using B HRs with min assist for balance and max cuing/facilitation to bring L UE up/down  handrail - cuing for reciprocal pattern to promote normalized gait mechanics and L LE NMR; however, pt only performed 1x on descent. Gait training ~140f and ~526f L HHA, with min assist for balance - initially pt with strong anterior lean requiring assist to prevent LOB and cuing to correct (anticipate this may be due to pt leaning forward on RW when using AD) - improved with pt able to maintain reciprocal stepping pattern and demos slight improvement in L foot clearance and step length compared to when using AD. Transported back to room and pt agreeable to remain sitting in w/c - left with needs in reach and seat belt alarm on.  Session 2: Pt received asleep, supine in bed with the covers over her head. Upon awakening pt agreeable to therapy session with minimal encouragement. Pt reports need to use bathroom. Supine>sitting L EOB, HOB slightly elevated and relying heavily on bedrail, with supervision and cuing for sequencing to increase pt independence as she has poor motor planning and problem solving to adjust mobility strategy to improve success. Sit>stand EOB>RW with min assist for lifting to stand and cuing to weight shift forward as pt tends to lean posteriorly. Gait training ~1573f2 in/out of bathroom using RW with CGA for steadying - pt continues to demo L inattention requiring cuing for obstacle navigation. Standing with CGA for steadying performed LB clothing management total assist due to urgency. Pt had started to be incontinent of bladder in brief but further able to void continently on toilet. Seated peri-care with se-tup assist. Standing  using UE support on RW as needed pt able to pull pants up over hips with min assist for L side - therapist facilitating L hand grasp on pants to promote NMR and L attention.  Standing at sink using RW with CGA for steadying performed hand hygiene with cuing for use of L hand to turn on/off water. Gait training ~200f61fo AD, with min assist for balance - pt continues to demo improved upright posture, increased gait speed, and increased L L foot clearance and step length compared to when using AD but does demo more impaired balance - when slowing down to navigate around something or turn pt does have shorter L LE step length requiring cuing for improvement to maintain balance. At end of session pt left seated in w/c with needs in reach and seat belt alarm on in preparation for meal tray.   Therapy Documentation Precautions:  Precautions Precautions: Fall Precaution Comments: L inattention Restrictions Weight Bearing Restrictions: No  Pain:   Session 1: No reports of pain throughout session.  Session 2: Denies pain during session.  Therapy/Group: Individual Therapy  CarlTawana ScaleT, DPT, CSRS  09/24/2020, 8:05 AM

## 2020-09-24 NOTE — Plan of Care (Signed)
  Problem: RH Problem Solving Goal: LTG Patient will demonstrate problem solving for (SLP) Description: LTG:  Patient will demonstrate problem solving for basic/complex daily situations with cues  (SLP) Flowsheets (Taken 09/24/2020 0951) LTG: Patient will demonstrate problem solving for (SLP): Basic daily situations LTG Patient will demonstrate problem solving for: Minimal Assistance - Patient > 75%   Problem: RH Memory Goal: LTG Patient will demonstrate ability for day to day (SLP) Description: LTG:   Patient will demonstrate ability for day to day recall/carryover during cognitive/linguistic activities with assist  (SLP) Flowsheets (Taken 09/24/2020 0951) LTG: Patient will demonstrate ability for day to day recall: New information LTG: Patient will demonstrate ability for day to day recall/carryover during cognitive/linguistic activities with assist (SLP): Minimal Assistance - Patient > 75%   Problem: RH Awareness Goal: LTG: Patient will demonstrate awareness during functional activites type of (SLP) Description: LTG: Patient will demonstrate awareness during functional activites type of (SLP) Flowsheets (Taken 09/24/2020 0951) Patient will demonstrate during cognitive/linguistic activities awareness type of:  Intellectual  Emergent LTG: Patient will demonstrate awareness during cognitive/linguistic activities with assistance of (SLP): Minimal Assistance - Patient > 75%

## 2020-09-25 ENCOUNTER — Encounter (HOSPITAL_COMMUNITY): Payer: Self-pay | Admitting: Physical Medicine & Rehabilitation

## 2020-09-25 ENCOUNTER — Other Ambulatory Visit: Payer: Self-pay

## 2020-09-25 LAB — PLATELET INHIBITION P2Y12: Platelet Function  P2Y12: 50 [PRU] — ABNORMAL LOW (ref 182–335)

## 2020-09-25 NOTE — Progress Notes (Signed)
Pt refusing care today including time toileting, also refusing to be checked to see if brief soiled. Educated pt regarding bowel and bladder care. Pt still refusing.

## 2020-09-25 NOTE — Progress Notes (Signed)
NIR.  P2Y12 this AM 50 PRU.  Discussed with Dr. Tommie Sams who recommends patient continue with DAPT as ordered (Brilinta 45 mg once daily and Aspirin 81 mg once daily).  Please call NIR with questions/concerns.   Waylan Boga Jaquavis Felmlee, PA-C 09/25/2020, 10:12 AM

## 2020-09-25 NOTE — Progress Notes (Signed)
PROGRESS NOTE   Subjective/Complaints:  No issues overnite, no HA, response times are quicker this am  ROS- denies CP SOB, N/V/D  Objective:   No results found. No results for input(s): WBC, HGB, HCT, PLT in the last 72 hours. No results for input(s): NA, K, CL, CO2, GLUCOSE, BUN, CREATININE, CALCIUM in the last 72 hours.  Intake/Output Summary (Last 24 hours) at 09/25/2020 0705 Last data filed at 09/24/2020 1300 Gross per 24 hour  Intake 118 ml  Output --  Net 118 ml        Physical Exam: Vital Signs Blood pressure 133/64, pulse (!) 55, temperature 98 F (36.7 C), resp. rate 20, height 5\' 10"  (1.778 m), weight 87.7 kg, SpO2 99 %.  General: No acute distress Mood and affect are appropriate Heart: Regular rate and rhythm no rubs murmurs or extra sounds Lungs: Clear to auscultation, breathing unlabored, no rales or wheezes Abdomen: Positive bowel sounds, soft nontender to palpation, nondistended Extremities: No clubbing, cyanosis, or edema Skin: No evidence of breakdown, no evidence of rash  Neurologic: Cranial nerves II through XII intact, motor strength is 5/5 R and 4/5 L deltoid, bicep, tricep, grip, 4/5 B hip flexor, knee extensors, ankle dorsiflexor and plantar flexor Sensory exam normal sensation to light touch and proprioception in bilateral upper and lower extremities Tactile extinction on Left with double simultaneous stimulation  Musculoskeletal: Full range of motion in all 4 extremities. Pain with RIght shoulder abd, no deformity or crepitus , mild pain with Adduction as well, no pain with ext rotation    Assessment/Plan: 1. Functional deficits which require 3+ hours per day of interdisciplinary therapy in a comprehensive inpatient rehab setting.  Physiatrist is providing close team supervision and 24 hour management of active medical problems listed below.  Physiatrist and rehab team continue to assess  barriers to discharge/monitor patient progress toward functional and medical goals  Care Tool:  Bathing    Body parts bathed by patient: Right arm,Left arm,Chest,Abdomen,Front perineal area,Buttocks,Right upper leg,Left upper leg,Face,Right lower leg,Left lower leg   Body parts bathed by helper: Right lower leg,Left lower leg     Bathing assist Assist Level: Minimal Assistance - Patient > 75%     Upper Body Dressing/Undressing Upper body dressing   What is the patient wearing?: Pull over shirt    Upper body assist Assist Level: Minimal Assistance - Patient > 75%    Lower Body Dressing/Undressing Lower body dressing      What is the patient wearing?: Pants,Incontinence brief     Lower body assist Assist for lower body dressing: Moderate Assistance - Patient 50 - 74%     Toileting Toileting    Toileting assist Assist for toileting: Minimal Assistance - Patient > 75%     Transfers Chair/bed transfer  Transfers assist     Chair/bed transfer assist level: Minimal Assistance - Patient > 75% Chair/bed transfer assistive device:   Ambulation assist      Assist level: Minimal Assistance - Patient > 75% Assistive device: No Device Max distance: 246ft   Walk 10 feet activity   Assist     Assist level: Minimal Assistance -  Patient > 75% Assistive device: No Device   Walk 50 feet activity   Assist    Assist level: Minimal Assistance - Patient > 75% Assistive device: No Device    Walk 150 feet activity   Assist Walk 150 feet activity did not occur: Safety/medical concerns  Assist level: Minimal Assistance - Patient > 75% Assistive device: No Device    Walk 10 feet on uneven surface  activity   Assist Walk 10 feet on uneven surfaces activity did not occur: Safety/medical concerns         Wheelchair     Assist Will patient use wheelchair at discharge?: No Type of Wheelchair: Manual    Wheelchair  assist level: Minimal Assistance - Patient > 75% Max wheelchair distance: 25    Wheelchair 50 feet with 2 turns activity    Assist            Wheelchair 150 feet activity     Assist          Blood pressure 133/64, pulse (!) 55, temperature 98 F (36.7 C), resp. rate 20, height 5\' 10"  (1.778 m), weight 87.7 kg, SpO2 99 %.    Medical Problem List and Plan: 1.  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  Status post right ICA revascularization             -patient may shower                           Continue CIR PT, OT, SLP 2.  Antithrombotics: -DVT/anticoagulation: SCDs             -antiplatelet therapy: Aspirin 81 mg daily, Brilinta 60 mg twice daily 3. Pain Management: Tylenol as needed, right shoulder pain mainly with abduction will check xray- no hx of fall per pt  4. Mood: Provide emotional support             -antipsychotic agents: N/A 5. 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 Skin/Wound Care: Routine skin checks 7. Fluids/Electrolytes/Nutrition: Routine in and outs. Electrolytes stable, monitor weekly 8.  Hypertension.  Norvasc 2.5 mg daily.- increase to 5mg  starting today, continue Vitals:   09/24/20 1929 09/25/20 0432  BP: (!) 144/61 133/64  Pulse: 68 (!) 55  Resp: 17 20  Temp: 98 F (36.7 C)   SpO2: 99% 99%  Controlled  5/20             Monitor with increased mobility 9.  Hyperlipidemia: Lipitor 10.  History of COPD with remote tobacco use.               O2 Sats every shift             Questionable medical noncompliance.  Provide counseling 11.  Post CVA HA:off topiramatie no increased c/os use prn tylenol due to concerns about sedation   LOS: 9 days A FACE TO FACE EVALUATION WAS PERFORMED  09/26/20 09/25/2020, 7:05 AM

## 2020-09-25 NOTE — Progress Notes (Signed)
Occupational Therapy Session Note  Patient Details  Name: [name] MRN: [id number] Date of Birth: [birth year]  Today's Date: 09/25/2020 OT Individual Time: 1004-1047 OT Individual Time Calculation (min): 43 min    Short Term Goals: Week 1:  OT Short Term Goal 1 (Week 1): Short term goals equal to LTGs set at supervision level overall. Week 2:  OT Short Term Goal 1 (Week 2): Short term goals equal to LTGs set at supervision level overall.  Skilled Therapeutic Interventions/Progress Updates:    Pt received in bed and consented to OT tx with encouragement. Pt req min A with bed rail to sit EOB to eat muffin from breakfast tray. Pt then required min A and mod cuing for STS and stand pivot transfer to w/c with RW with cuing on sequencing and steps throughout transition. Pt taken down to therapy gym, instructed in [place] board activity while standing to increase tolerance to upright position. Pt instructed in visual motor and visual scanning exercises with functional reach for standing balance. Pt required 2 seated rest breaks during activity due to fatigue. Constant cuing for proper hand placement during STSs with RW to and from w/c. Pt is very slow moving and requires increased time for all tasks. After tx, pt helped back to bed with min A and left semifowler in bed with alarm on and all needs met.   Therapy Documentation Precautions:  Precautions Precautions: Fall Precaution Comments: L inattention Restrictions Weight Bearing Restrictions: No Pain: Pain Assessment Pain Scale: 0-10 Pain Score: 6  Pain Type: Acute pain Pain Location: Head Pain Descriptors / Indicators: Headache Pain Frequency: Intermittent Pain Onset: Gradual Pain Intervention(s): Medication (See eMAR)   Therapy/Group: Individual Therapy  [name] 09/25/2020, 10:43 AM

## 2020-09-25 NOTE — Progress Notes (Signed)
Physical Therapy Session Note  Patient Details  Name: [name] MRN: [id number] Date of Birth: February 23, 1946  Today's Date: 09/25/2020 PT Individual Time: 1115-1130 PT Individual Time Calculation (min): 15 min  and Today's Date: 09/25/2020 PT Missed Time: 30 Minutes Missed Time Reason: Patient unwilling to participate  Short Term Goals: Week 2:  PT Short Term Goal 1 (Week 2): = to LTGs based on ELOS  Skilled Therapeutic Interventions/Progress Updates:    Patient received supine in bed, asleep, difficult to wake initially. She denies pain when asked, but reports fatigue. Patients attention to PT and response time to PTs questions has significantly improved. However, patient stating "I'm just lazy and I don't want to get out of bed." PT reminding patient, gently, that that's not a reason to not participate in therapy. Patient stating "I walk just fine." When PT suggested other activities patient didn't respond. When PT asked what patient would rather do she stated "stay in bed and be left alone." PT gently attempting to educate patient on purpose and importance of participation in therapy until dc. Patient stating "I'm just going to stay in bed." PT unable to redirect patient to therapeutic tasks. Bed alarm on, call light within reach.   Therapy Documentation Precautions:  Precautions Precautions: Fall Precaution Comments: L inattention Restrictions Weight Bearing Restrictions: No    Therapy/Group: Individual Therapy  Elizebeth Koller, PT, DPT, CBIS  09/25/2020, 7:39 AM

## 2020-09-25 NOTE — Progress Notes (Signed)
Patient ID: [name], female   DOB: Feb 29, 1946, 75 y.o.   MRN: [id number]   Sw left pt daughter [name]) a VM. Waiting for [facility] selections. SW will cont to follow up.  [place], [place] [phone]

## 2020-09-25 NOTE — Plan of Care (Signed)
  Problem: RH Awareness Goal: LTG: Patient will demonstrate awareness during functional activites type of (SLP) Description: LTG: Patient will demonstrate awareness during functional activites type of (SLP) Flowsheets (Taken 09/25/2020 1647) LTG: Patient will demonstrate awareness during cognitive/linguistic activities with assistance of (SLP): Moderate Assistance - Patient 50 - 74%   Problem: RH Memory Goal: LTG Patient will demonstrate ability for day to day (SLP) Description: LTG:   Patient will demonstrate ability for day to day recall/carryover during cognitive/linguistic activities with assist  (SLP) Flowsheets (Taken 09/25/2020 1647) LTG: Patient will demonstrate ability for day to day recall/carryover during cognitive/linguistic activities with assist (SLP): Moderate Assistance - Patient 50 - 74%

## 2020-09-25 NOTE — Progress Notes (Signed)
Speech Language Pathology Weekly Progress and Session Note  Patient Details  Name: Breshay Ilg MRN: [id number] Date of Birth: 06-26-1945  Beginning of progress report period: 09/17/2020 End of progress report period: 09/25/2020  Today's Date: 09/25/2020 SLP Individual Time: 1400-1430 SLP Individual Time Calculation (min): 30 min  Short Term Goals: Week 1: SLP Short Term Goal 1 (Week 1): Patient will perform simulated money/check/bank management tasks with minA cues for accuracy. SLP Short Term Goal 1 - Progress (Week 1): Not progressing SLP Short Term Goal 2 (Week 1): Patient will complete simulated weekly pill box task with supervision A for accuracy. SLP Short Term Goal 2 - Progress (Week 1): Not progressing SLP Short Term Goal 3 (Week 1): Patient will compensate for left visual field impairment with supervision A level. SLP Short Term Goal 3 - Progress (Week 1): Not progressing SLP Short Term Goal 4 (Week 1): Patient will recall specific exercises, strategies, tasks completed in daily PT, OT, ST sessions with minA. SLP Short Term Goal 4 - Progress (Week 1): Not progressing    New Short Term Goals: Week 2: SLP Short Term Goal 1 (Week 2): STG=LTG's (anticipated discharge 5/23)  Weekly Progress Updates:  Patient has lacked adequate willingness to participate fully in tasks and does not demonstrate any awareness to the impact of her deficits on her ability to complete ADL tasks. She did not meet any of her STG's and is not demonstrating adequate progress towards them. SLP to downgrade her LTG"s due to lack of progress.    Intensity: Minumum of 1-2 x/day, 30 to 90 minutes Frequency: 3 to 5 out of 7 days Duration/Length of Stay: 5/23 Treatment/Interventions: Cognitive remediation/compensation;Medication managment;Patient/family education;Functional tasks   Daily Session  Skilled Therapeutic Interventions: Patient seen to address cognitive function goals. When SLP arrived, patient in  bed but easily aroused. Of note, per chart review, patient refused a full PT session due to not wanting to get out of bed, refused majority of OT session for similar reason. Patient verbalized agreement to participate in ST session but was engaged at all. When asked how her therapies were going and her thoughts on everything, she gave limited responses, basically saying she felt she was fine. SLP introduced medication management task with pill box but after getting instructions, patient then asked what SLP wanted her to do. She did not initiate or attempt task and then said she could handle her medications fine. When SLP asked patient what she was working on in therapy, she did not have answer and stated she thought she was "fine". She was not able to give an answer when SLP asked her what kind of help she anticipated needing at home.    General    Pain Pain Assessment Pain Scale: 0-10 Pain Score: 0-No pain  Therapy/Group: Individual Therapy  Angela Nevin, MA, CCC-SLP Speech Therapy

## 2020-09-25 NOTE — Progress Notes (Signed)
Occupational Therapy Session Note  Patient Details  Name: [name] MRN: [id number] Date of Birth: 01-09-1946  Today's Date: 09/25/2020 OT Individual Time: 9528-4132 OT Individual Time Calculation (min): 16 min  and Today's Date: 09/25/2020 OT Missed Time: 29 Minutes Missed Time Reason: Patient fatigue   Short Term Goals: Week 2:  OT Short Term Goal 1 (Week 2): Short term goals equal to LTGs set at supervision level overall.  Skilled Therapeutic Interventions/Progress Updates:    Pt sleeping upon arrival. Mod verbal cues to arouse. Attempted to engage pt in OOB activities to prepare for next therapy and remainder of day. Pt had not eaten breakfast and declined sitting EOB or in w/c to eat breakfast. Reviewed goals and purpose of therapy to prepare for discharge home. Pt would only respond with "no" to all questions and requests. Discussed with RN. RN commented that pt had headache and did not sleep well previous night. On return to room pt sleeping again. Pt continued to refuse engaging in therapy. Pt missed 29 mins skilled OT services. Pt remained in bed with bed alarm activated  Therapy Documentation Precautions:  Precautions Precautions: Fall Precaution Comments: L inattention Restrictions Weight Bearing Restrictions: No General: General OT Amount of Missed Time: 29 Minutes Vital Signs:  Pain: Pt c/o headache this morning; RN admin meds prior to therapy   Therapy/Group: Individual Therapy  Rich Brave 09/25/2020, 9:21 AM

## 2020-09-26 DIAGNOSIS — M25511 Pain in right shoulder: Secondary | ICD-10-CM

## 2020-09-26 DIAGNOSIS — I10 Essential (primary) hypertension: Secondary | ICD-10-CM

## 2020-09-26 DIAGNOSIS — I63511 Cerebral infarction due to unspecified occlusion or stenosis of right middle cerebral artery: Secondary | ICD-10-CM

## 2020-09-26 DIAGNOSIS — D62 Acute posthemorrhagic anemia: Secondary | ICD-10-CM

## 2020-09-26 NOTE — Progress Notes (Signed)
Physical Therapy Session Note  Patient Details  Name: [name] MRN: [id number] Date of Birth: August 09, 1945  Today's Date: 09/26/2020 PT Individual Time: 3343-5686 PT Individual Time Calculation (min): 43 min   Short Term Goals: Week 2:  PT Short Term Goal 1 (Week 2): = to LTGs based on ELOS  Skilled Therapeutic Interventions/Progress Updates:    Pt received supine in bed awake but lights dimmed, agreeable to therapy session with minimal encouragement. Supine>sitting L EOB, HOB slightly elevated and using bedrail, with min assist at this time - continues to require cuing for sequencing as pt initiates the task but then once experiences difficulty executing the task has impaired problem solving on finding a new strategy to be successful. Pt continues to demo significant hypokinesia with very slow movements. Sit>stand EOB>RW with pt requiring multiple attempts prior to being able to power up into standing with heavy min assist - cuing to count to 3 to increase motor planning and initiation - continued cuing for safe hand placement when using AD. Gait training ~151ft to main therapy gym using RW with CGA for steadying - continues to demo decreased L LE foot clearance and step length as well as slow gait speed - cuing and facilitation for improvement. Pt educated on D/C recommendations with need for 24hr support, need to use RW for all standing/ambulatory tasks, as well as recommendation for follow-up HHPT. Pt in agreement and reports no questions/concerns at this time. Therapist oriented pt to scheduled family education tomorrow of which pt was unaware. Stair navigation training via ascending/descending 4 6" steps using B HRs - CGA for steadying - cuing to ascend leading with L LE to promote improved L hip/knee flexor activation and strengthening then descended leading with L LE for safety - pt demos improved attention to L hand placement on steps today. Gait training ~124ft back to room using RW with CGA  for steadying and continued cuing/facilitation to address the above gait impairments with pt showing some improvement. Pt reports need to use bathroom. Pt left in care of NT to assist with toileting.   Therapy Documentation Precautions:  Precautions Precautions: Fall Precaution Comments: L inattention Restrictions Weight Bearing Restrictions: No  Pain:   Denies pain during session.   Therapy/Group: Individual Therapy  Ginny Forth , PT, DPT, CSRS  09/26/2020, 4:30 PM

## 2020-09-26 NOTE — Plan of Care (Signed)
  Problem: Consults Goal: RH STROKE PATIENT EDUCATION Description: See Patient Education module for education specifics  Outcome: Progressing   Problem: RH BOWEL ELIMINATION Goal: RH STG MANAGE BOWEL WITH ASSISTANCE Description: STG Manage Bowel with mod I Assistance. Outcome: Progressing Goal: RH STG MANAGE BOWEL W/MEDICATION W/ASSISTANCE Description: STG Manage Bowel with Medication with mod I Assistance. Outcome: Progressing   Problem: RH SAFETY Goal: RH STG ADHERE TO SAFETY PRECAUTIONS W/ASSISTANCE/DEVICE Description: STG Adhere to Safety Precautions With cues/reminders Assistance/Device. Outcome: Progressing   Problem: RH KNOWLEDGE DEFICIT Goal: RH STG INCREASE KNOWLEDGE OF HYPERTENSION Description: Patient will be able to manage HTN with medications and dietary modifications using handouts and educational tools independently Outcome: Progressing Goal: RH STG INCREASE KNOWLEGDE OF HYPERLIPIDEMIA Description: Patient will be able to manage HLD with medications and dietary modifications using handouts and educational tools independently Outcome: Progressing Goal: RH STG INCREASE KNOWLEDGE OF STROKE PROPHYLAXIS Description: Patient will be able to manage secondary stroke risks with medications and dietary modifications using handouts and educational tools independently Outcome: Progressing

## 2020-09-26 NOTE — Discharge Summary (Signed)
Physician Discharge Summary  Patient ID: [name] MRN: [id number] DOB/AGE: 05-18-1945 75 y.o.  Admit date: 09/16/2020 Discharge date: 09/28/2020  Discharge Diagnoses:  Principal Problem:   CVA (cerebral vascular accident) [facility]) Active Problems:   Right shoulder pain   Acute blood loss anemia   Essential hypertension Hyperlipidemia COPD Remote tobacco abuse Acute blood loss anemia Thoracicoabdominal aortic aneurysm Questionable medical compliance  Discharged Condition: Stable  Significant Diagnostic Studies: CT Angio Head W or Wo Contrast  Result Date: 09/10/2020 CLINICAL DATA:  Right gaze preference and left facial droop EXAM: CT ANGIOGRAPHY HEAD AND NECK TECHNIQUE: Multidetector CT imaging of the head and neck was performed using the standard protocol during bolus administration of intravenous contrast. Multiplanar CT image reconstructions and MIPs were obtained to evaluate the vascular anatomy. Carotid stenosis measurements (when applicable) are obtained utilizing NASCET criteria, using the distal internal carotid diameter as the denominator. CONTRAST:  8m OMNIPAQUE IOHEXOL 350 MG/ML SOLN COMPARISON:  None. FINDINGS: CTA NECK Aortic arch: Eccentric noncalcified plaque along the visualized distal aortic arch. Great vessel origins are patent. No high-grade proximal subclavian stenosis. Right carotid system: Common carotid is patent. There is mixed plaque at the bifurcation. ICA is occluded just beyond the origin. ECA is patent. Left carotid system: Patent. Atherosclerotic wall thickening along the common carotid. Mixed but primarily calcified plaque at the bifurcation and along the proximal internal carotid. There is less than 50% stenosis. Vertebral arteries: Patent. Left vertebral artery slightly dominant. Mild plaque at the origins. Skeleton: Degenerative changes of the included spine. Other neck: No acute abnormality. Upper chest: No apical lung mass. Review of the MIP images  confirms the above findings CTA HEAD Anterior circulation: At least partial reconstitution of the right ICA at the clinoid where there is calcified plaque. Right anterior and middle cerebral arteries are patent. Atherosclerotic irregularity of the right M1 MCA with mild and moderate stenoses. Mild distal branch atherosclerotic irregularity. Left intracranial internal carotid arteries patent with calcified plaque causing mild stenosis. Left anterior and middle cerebral arteries are patent. Atherosclerotic irregularity of the left M1 MCA with mild stenosis. Mild distal branch atherosclerotic irregularity. Posterior circulation: Intracranial vertebral arteries are patent. Basilar artery is patent. Major cerebellar artery origins are patent. Posterior cerebral arteries are patent. Focal moderate stenosis of the right P2 PCA. A right posterior communicating artery is present. Venous sinuses: Patent as allowed by contrast bolus timing. 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 At least partial reconstitution at the clinoid intracranially. Less than 50% stenosis at the left ICA origin. Mild to moderate intracranial atherosclerosis. Results were discussed by telephone at the time of interpretation on 09/10/2020 at 4:34 pm with provider Dr. BCurly Shores who verbally acknowledged these results. Electronically Signed   By: PMacy MisM.D.   On: 09/10/2020 16:37   DG Shoulder Right  Result Date: 09/17/2020 CLINICAL DATA:  Right shoulder pain. EXAM: RIGHT SHOULDER - 2+ VIEW COMPARISON:  None. FINDINGS: Mild greater tuberosity hyperostosis. Mild acromial hyperostosis. Focal periosteal thickening in the proximal right humeral shaft, suggesting previous trauma. Otherwise, unremarkable bones and soft tissues. IMPRESSION: No acute abnormality. Electronically Signed   By: SClaudie ReveringM.D.   On: 09/17/2020 14:14   CT HEAD WO  CONTRAST  Result Date: 09/16/2020 CLINICAL DATA:  Stroke follow-up EXAM: CT HEAD WITHOUT CONTRAST TECHNIQUE: Contiguous axial images were obtained from the base of the skull through the vertex without intravenous contrast. COMPARISON:  Yesterday FINDINGS: Brain: Subarachnoid and intraventricular hemorrhage is non progressed and similar in distribution. Nonprogressive right parietal infarct. No hydrocephalus, mass, or midline shift. Vascular: No hyperdense vessel or unexpected calcification. Skull: Normal. Negative for fracture or focal lesion. Sinuses/Orbits: No acute finding. IMPRESSION: No new abnormality or progression of intracranial hemorrhage and right cerebral infarction. Electronically Signed   By: Monte Fantasia M.D.   On: 09/16/2020 10:58   CT HEAD WO CONTRAST  Result Date: 09/15/2020 CLINICAL DATA:  Initial evaluation for new onset right arm weakness, history of recent stroke. EXAM: CT HEAD WITHOUT CONTRAST TECHNIQUE: Contiguous axial images were obtained from the base of the skull through the vertex without intravenous contrast. COMPARISON:  Prior studies from 09/11/2020. FINDINGS: Brain: Patchy hypodensities involving the right basal ganglia and posterior right parieto-occipital region, consistent with evolving acute to subacute right MCA distribution infarcts. Since previous exam, there has been interval development of scattered subarachnoid hemorrhage involving the right sylvian fissure and overlying right frontal cortical sulci. Small volume intraventricular hemorrhage seen as well, with blood seen layering within the occipital horns of both lateral ventricles, likely related to redistribution. No hydrocephalus or ventricular trapping. No significant mass effect or midline shift. Basilar cisterns remain patent. No other definite acute large vessel territory infarct. No mass lesion or extra-axial fluid collection. Underlying atrophy with chronic microvascular ischemic disease again noted.  Vascular: No visible hyperdense vessel. Skull: Scalp soft tissues and calvarium within normal limits. Sinuses/Orbits: Globes and orbital soft tissues within normal limits. Moderate mucosal thickening in opacity seen throughout the paranasal sinuses, with air-fluid levels present within both maxillary sinuses. Small right mastoid effusion noted. Other: None. IMPRESSION: 1. Interval development of scattered subarachnoid hemorrhage involving the right Sylvian fissure and overlying right frontal cortical sulci. Small volume intraventricular hemorrhage seen as well, likely related to redistribution. No hydrocephalus or ventricular trapping. 2. Evolving acute to subacute right MCA distribution infarcts. No significant mass effect or midline shift. 3. No other new acute intracranial abnormality. 4. Underlying atrophy with chronic microvascular ischemic disease. Critical Value/emergent results were called by telephone at the time of interpretation on 09/15/2020 at 2:18 am to provider [facility] , who verbally acknowledged these results. Electronically Signed   By: Jeannine Boga M.D.   On: 09/15/2020 02:33   CT Angio Neck W and/or Wo Contrast  Result Date: 09/10/2020 CLINICAL DATA:  Right gaze preference and left facial droop EXAM: CT ANGIOGRAPHY HEAD AND NECK TECHNIQUE: Multidetector CT imaging of the head and neck was performed using the standard protocol during bolus administration of intravenous contrast. Multiplanar CT image reconstructions and MIPs were obtained to evaluate the vascular anatomy. Carotid stenosis measurements (when applicable) are obtained utilizing NASCET criteria, using the distal internal carotid diameter as the denominator. CONTRAST:  68m OMNIPAQUE IOHEXOL 350 MG/ML SOLN COMPARISON:  None. FINDINGS: CTA NECK Aortic arch: Eccentric noncalcified plaque along the visualized distal aortic arch. Great vessel origins are patent. No high-grade proximal subclavian stenosis. Right carotid  system: Common carotid is patent. There is mixed plaque at the bifurcation. ICA is occluded just beyond the origin. ECA is patent. Left carotid system: Patent. Atherosclerotic wall thickening along the common carotid. Mixed but primarily calcified plaque at the bifurcation and along the proximal internal carotid. There is less than 50% stenosis. Vertebral arteries: Patent. Left vertebral artery slightly dominant. Mild plaque at the origins. Skeleton: Degenerative changes of the included spine. Other neck: No acute abnormality. Upper chest: No apical lung mass. Review of the MIP images confirms  the above findings CTA HEAD Anterior circulation: At least partial reconstitution of the right ICA at the clinoid where there is calcified plaque. Right anterior and middle cerebral arteries are patent. Atherosclerotic irregularity of the right M1 MCA with mild and moderate stenoses. Mild distal branch atherosclerotic irregularity. Left intracranial internal carotid arteries patent with calcified plaque causing mild stenosis. Left anterior and middle cerebral arteries are patent. Atherosclerotic irregularity of the left M1 MCA with mild stenosis. Mild distal branch atherosclerotic irregularity. Posterior circulation: Intracranial vertebral arteries are patent. Basilar artery is patent. Major cerebellar artery origins are patent. Posterior cerebral arteries are patent. Focal moderate stenosis of the right P2 PCA. A right posterior communicating artery is present. Venous sinuses: Patent as allowed by contrast bolus timing. 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 At least partial reconstitution at the clinoid intracranially. Less than 50% stenosis at the left ICA origin. Mild to moderate intracranial atherosclerosis. Results were discussed by telephone at the time of interpretation on 09/10/2020 at 4:34 pm with provider Dr.  Curly Shores, who verbally acknowledged these results. Electronically Signed   By: Macy Mis M.D.   On: 09/10/2020 16:37   MR BRAIN WO CONTRAST  Result Date: 09/10/2020 CLINICAL DATA:  ICA occlusion, code stroke follow-up EXAM: MRI HEAD WITHOUT CONTRAST TECHNIQUE: Multiplanar, multiecho pulse sequences of the brain and surrounding structures were obtained without intravenous contrast. COMPARISON:  Prior CT FINDINGS: Brain: There is diffusion hyperintensity in the right parietal lobe corresponding to infarct on CT. There is mixed ADC including hypointensity. Few additional punctate foci of mildly reduced diffusion in the right centrum semiovale. There is no evidence of intracranial hemorrhage. There is no intracranial mass or significant mass effect. There is no hydrocephalus or extra-axial fluid collection. Prominence of the ventricles and sulci reflects generalized parenchymal volume loss. Other patchy foci of T2 hyperintensity in the supratentorial white matter are nonspecific but may reflect mild chronic microvascular ischemic changes. Vascular: Loss of the flow void of the visualized cervical right ICA and proximal intracranial portion corresponding to findings on CTA. Skull and upper cervical spine: Normal marrow signal is preserved. Sinuses/Orbits: Paranasal sinus mucosal thickening with maxillary sinus air-fluid levels. Orbits are unremarkable. Other: Sella is unremarkable.  Mastoid air cells are clear. 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 No evidence of hemorrhage. Electronically Signed   By: Macy Mis M.D.   On: 09/10/2020 19:08   IR INTRAVSC STENT CERV CAROTID W/O EMB-PROT MOD SED  Result Date: 09/14/2020 INDICATION: 75 year old female with past medical history significant for hypertension, COPD, aortic aneurysm, DVT in pregnancy, former smoking. She was admitted on  09/10/2020 due to history of dizziness and imbalance for 2-3 weeks. 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 Initial NIHSS was 6. However, symptoms resolved during patient evaluation. On 09/11/2020, patient was initially evaluated in the morning and found to have NIHSS of 1 for dysarthria which she refers as her baseline. At 7:25 p.m., patient was found to have new onset of left-sided weakness and neglect, NIHSS 8. Her last known at baseline was 7 p.m. repeat CT angiogram of the head and neck showed new stenosis of the right M1 segment. Case was discussed in the family with details by Dr. Weldon Picking  Rory Percy, MD. Decision was made to proceed with a diagnostic cerebral angiogram and mechanical thrombectomy. EXAM: ULTRASOUND-GUIDED VASCULAR ACCESS DIAGNOSTIC CEREBRAL ANGIOGRAM MECHANICAL THROMBECTOMY RIGHT INTERNAL CAROTID ARTERY ANGIOPLASTY AND STENTING FLAT PANEL HEAD CT COMPARISON:  CT/CT angiogram of the head and neck Sep 11, 2020 MEDICATIONS: Cangrelor IV bolus and drip. ANESTHESIA/SEDATION: The procedure was performed under general anesthesia. CONTRAST:  80 mL of Omnipaque 240 milligram/mL FLUOROSCOPY TIME:  Fluoroscopy Time: 74 minutes 36 seconds (2,200 mGy). COMPLICATIONS: None immediate. TECHNIQUE: Informed written consent was obtained from the patient's daughter after a thorough discussion of the procedural risks, benefits and alternatives. All questions were addressed. Maximal Sterile Barrier Technique was utilized including caps, mask, sterile gowns, sterile gloves, sterile drape, hand hygiene and skin antiseptic. A timeout was performed prior to the initiation of the procedure. The right groin was prepped and draped in the usual sterile fashion. Using a micropuncture kit and the modified Seldinger technique, access was gained to the right common femoral artery and an 8 French sheath was placed. Real-time ultrasound guidance  was utilized for vascular access including the acquisition of a permanent ultrasound image documenting patency of the accessed vessel. Under fluoroscopy, an 8 [place] emboguard guide catheter was navigated over a 6 [place] Berenstein 2 catheter and a 0.035" Terumo Glidewire into the aortic arch. The catheter was placed into the right common carotid artery. Frontal and lateral angiograms of the neck and skull base were obtained. Attempted navigation through and occluded right carotid bulb with a phenom 21 microcatheter and Aristotle 14 microguidewire proved unsuccessful. Next, a Berenstein 2 catheter was navigated over a 0.035" Terumo Glidewire through the carotid occlusion into the proximal cervical segment of the right ICA. Frontal and lateral angiograms of the head and neck were obtained. FINDINGS: 1. Patent right common femoral artery. 2. 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 3. Opacification of the ophthalmic segment of the right ICA via right ophthalmic artery, suggesting chronic occlusion. Patent right MCA noted with possible nonocclusive filling defect at the distal M1 segment. 4. Decrease caliber and irregular lumen of the cervical right ICA with non flow limiting dissection at the proximal segment noted after crossing the carotid bulb occlusion. Near occlusion of the distal right M1/MCA with minimal contrast penetration M2 branches. PROCEDURE: The Berenstein 2 catheter was exchanged over the wire for an exchange length 0.018 " command microwire for a large bore catheter navigated over a phenom 21 microcatheter. The catheter was placed into the cavernous segment of the right ICA. Frontal and lateral angiograms of the head were obtained. Near occlusion of the distal right M1/MCA segment confirmed. Under biplane roadmap, a large bore aspiration catheter was navigated over a phenom 21 microcatheter and a synchro support microguidewire into the cavernous segment of the right  ICA. The microcatheter was then navigated over the wire into the left M2/MCA posterior division branch. Then, a 5 x 37 mm embotrap stent retriever was deployed spanning the M1 M2 segment. The device was allowed to intercalated with the clot for 4 minutes. The microcatheter was removed. The aspiration catheter was advanced to the level of occlusion and connected to a penumbra aspiration pump. The guiding catheter was advanced into the upper cervical segment of the right ICA and the balloon was inflated. The thrombectomy device and aspiration catheter were removed under constant aspiration. Right internal carotid artery angiogram showed complete recanalization of the right MCA vascular tree (TICI3). The guiding catheter was then retracted into the right common  carotid artery. Frontal and lateral angiograms of the neck were obtained. Decrease caliber of cervical right ICA with prominent lumen irregularity and ulceration at the bulb. Flat panel CT of the head was obtained and post processed in a separate workstation with concurrent attending physician supervision. Selected images were sent to PACS. No evidence of hemorrhagic complication. Right common carotid artery angiograms were obtained with frontal and lateral views of the neck, in the working projections. Next, a 6-8 x 40 mm XACT carotid stent was deployed across the right carotid bifurcation. Frontal and lateral angiograms of the neck showed good position of the carotid stent with moderate in stent stenosis. In stent angioplasty was then performed using a 4 x 30 mm Viatrac balloon with resolution of the InStent stenosis. Follow-up right common carotid artery angiogram showed progressed improvement of the caliber of the cervical right ICA distal to the stent with unimpeded flow through the stent. Right common carotid artery angiograms with frontal and lateral views of the head showed no evidence of thromboembolic complication with excellent anterograde flow in the  right anterior circulation. Delay angiograms of the right common carotid artery with frontal and lateral views of the neck showed stable appearance of the recently deployed stent without evidence of in stent clot formation. Non flow limiting dissection of the mid cervical segment appears stable. The catheter was subsequently withdrawn. Right common femoral artery angiograms with frontal and lateral views showed access at the mid segment of the right common femoral artery which has adequate caliber for closure device. The femoral sheath was exchanged over the wire for a Perclose pro style which was utilized for access closure. Immediate hemostasis was achieved. 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 Complete revascularization of the right MCA achieved (TICI 3). No evidence of hemorrhagic or thromboembolic complication. PLAN: 1. Transition to dual antiplatelet therapy with aspirin and Plavix once cangrelor drip is discontinued. 2. Transferred to ICU for continued care. 3. Goal SBP 120-140 mgHg. Electronically Signed   By: Pedro Earls M.D.   On: 09/14/2020 15:27   IR CT Head Ltd  Result Date: 09/14/2020 INDICATION: 75 year old female with past medical history significant for hypertension, COPD, aortic aneurysm, DVT in pregnancy, former smoking. She was admitted on 09/10/2020 due to history of dizziness and imbalance for 2-3 weeks. 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 Initial NIHSS was 6. However, symptoms resolved during patient evaluation. On 09/11/2020, patient was initially evaluated in the morning and found to have NIHSS of 1 for dysarthria which she refers as her baseline. At 7:25 p.m., patient was found to have new onset of left-sided weakness and  neglect, NIHSS 8. Her last known at baseline was 7 p.m. repeat CT angiogram of the head and neck showed new stenosis of the right M1 segment. Case was discussed in the family with details by Dr. Amie [place], MD. Decision was made to proceed with a diagnostic cerebral angiogram and mechanical thrombectomy. EXAM: ULTRASOUND-GUIDED VASCULAR ACCESS DIAGNOSTIC CEREBRAL ANGIOGRAM MECHANICAL THROMBECTOMY RIGHT INTERNAL CAROTID ARTERY ANGIOPLASTY AND STENTING FLAT PANEL HEAD CT COMPARISON:  CT/CT angiogram of the head and neck Sep 11, 2020 MEDICATIONS: Cangrelor IV bolus and drip. ANESTHESIA/SEDATION: The procedure was performed under general anesthesia. CONTRAST:  80 mL of Omnipaque 240 milligram/mL FLUOROSCOPY TIME:  Fluoroscopy Time: 74 minutes 36 seconds (2,200 mGy). COMPLICATIONS: None immediate. TECHNIQUE: Informed written consent was obtained from the patient's daughter after a thorough discussion of the procedural risks, benefits and alternatives. All questions were addressed. Maximal Sterile Barrier Technique was utilized including caps, mask, sterile gowns, sterile gloves, sterile drape, hand hygiene and skin antiseptic. A timeout was performed prior to the initiation of the procedure. The right groin was prepped and draped in the usual sterile fashion. Using a micropuncture kit and the modified Seldinger technique, access was gained to the right common femoral artery and an 8 French sheath was placed. Real-time ultrasound guidance was utilized for vascular access including the acquisition of a permanent ultrasound image documenting patency of the accessed vessel. Under fluoroscopy, an 8 [place] emboguard guide catheter was navigated over a 6 [place] Berenstein 2 catheter and a 0.035" Terumo Glidewire into the aortic arch. The catheter was placed into the right common carotid artery. Frontal and lateral angiograms of the neck and skull base were obtained. Attempted navigation through and occluded right carotid bulb  with a phenom 21 microcatheter and Aristotle 14 microguidewire proved unsuccessful. Next, a Berenstein 2 catheter was navigated over a 0.035" Terumo Glidewire through the carotid occlusion into the proximal cervical segment of the right ICA. Frontal and lateral angiograms of the head and neck were obtained. FINDINGS: 1. Patent right common femoral artery. 2. 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 3. Opacification of the ophthalmic segment of the right ICA via right ophthalmic artery, suggesting chronic occlusion. Patent right MCA noted with possible nonocclusive filling defect at the distal M1 segment. 4. Decrease caliber and irregular lumen of the cervical right ICA with non flow limiting dissection at the proximal segment noted after crossing the carotid bulb occlusion. Near occlusion of the distal right M1/MCA with minimal contrast penetration M2 branches. PROCEDURE: The Berenstein 2 catheter was exchanged over the wire for an exchange length 0.018 " command microwire for a large bore catheter navigated over a phenom 21 microcatheter. The catheter was placed into the cavernous segment of the right ICA. Frontal and lateral angiograms of the head were obtained. Near occlusion of the distal right M1/MCA segment confirmed. Under biplane roadmap, a large bore aspiration catheter was navigated over a phenom 21 microcatheter and a synchro support microguidewire into the cavernous segment of the right ICA. The microcatheter was then navigated over the wire into the left M2/MCA posterior division branch. Then, a 5 x 37 mm embotrap stent retriever was deployed spanning the M1 M2 segment. The device was allowed to intercalated with the clot for 4 minutes. The microcatheter was removed. The aspiration catheter was advanced to the level of occlusion and connected to a penumbra aspiration pump. The guiding catheter was advanced into the upper cervical segment of the right ICA and the  balloon was inflated. The thrombectomy device and aspiration catheter were removed under constant aspiration. Right internal carotid artery angiogram showed complete recanalization of the right MCA vascular tree (TICI3). The guiding catheter was then retracted into the right common carotid artery. Frontal and lateral angiograms of the neck were obtained. Decrease caliber of cervical right ICA with prominent lumen irregularity and ulceration at the bulb. Flat panel CT of the head was obtained and post processed in a separate workstation with concurrent attending physician supervision. Selected images were sent to PACS. No evidence of hemorrhagic complication. Right common carotid artery angiograms were obtained with frontal and lateral views of the neck,  in the working projections. Next, a 6-8 x 40 mm XACT carotid stent was deployed across the right carotid bifurcation. Frontal and lateral angiograms of the neck showed good position of the carotid stent with moderate in stent stenosis. In stent angioplasty was then performed using a 4 x 30 mm Viatrac balloon with resolution of the InStent stenosis. Follow-up right common carotid artery angiogram showed progressed improvement of the caliber of the cervical right ICA distal to the stent with unimpeded flow through the stent. Right common carotid artery angiograms with frontal and lateral views of the head showed no evidence of thromboembolic complication with excellent anterograde flow in the right anterior circulation. Delay angiograms of the right common carotid artery with frontal and lateral views of the neck showed stable appearance of the recently deployed stent without evidence of in stent clot formation. Non flow limiting dissection of the mid cervical segment appears stable. The catheter was subsequently withdrawn. Right common femoral artery angiograms with frontal and lateral views showed access at the mid segment of the right common femoral artery which has  adequate caliber for closure device. The femoral sheath was exchanged over the wire for a Perclose pro style which was utilized for access closure. Immediate hemostasis was achieved. 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 Complete revascularization of the right MCA achieved (TICI 3). No evidence of hemorrhagic or thromboembolic complication. PLAN: 1. Transition to dual antiplatelet therapy with aspirin and Plavix once cangrelor drip is discontinued. 2. Transferred to ICU for continued care. 3. Goal SBP 120-140 mgHg. Electronically Signed   By: Pedro Earls M.D.   On: 09/14/2020 15:27   IR US Guide Vasc Access Right  Result Date: 09/14/2020 INDICATION: 75 year old female with past medical history significant for hypertension, COPD, aortic aneurysm, DVT in pregnancy, former smoking. She was admitted on 09/10/2020 due to history of dizziness and imbalance for 2-3 weeks. 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 Initial NIHSS was 6. However, symptoms resolved during patient evaluation. On 09/11/2020, patient was initially evaluated in the morning and found to have NIHSS of 1 for dysarthria which she refers as her baseline. At 7:25 p.m., patient was found to have new onset of left-sided weakness and neglect, NIHSS 8. Her last known at baseline was 7 p.m. repeat CT angiogram of the head and neck showed new stenosis of the right M1 segment. Case was discussed in the family with details by Dr. Amie Portland, MD. Decision was made to proceed with a diagnostic cerebral angiogram and mechanical thrombectomy. EXAM: ULTRASOUND-GUIDED VASCULAR ACCESS DIAGNOSTIC CEREBRAL ANGIOGRAM MECHANICAL THROMBECTOMY RIGHT INTERNAL CAROTID ARTERY ANGIOPLASTY AND STENTING FLAT PANEL HEAD CT  COMPARISON:  CT/CT angiogram of the head and neck Sep 11, 2020 MEDICATIONS: Cangrelor IV bolus and drip. ANESTHESIA/SEDATION: The procedure was performed under general anesthesia. CONTRAST:  80 mL of Omnipaque 240 milligram/mL FLUOROSCOPY TIME:  Fluoroscopy Time: 74 minutes 36 seconds (2,200 mGy). COMPLICATIONS: None immediate. TECHNIQUE: Informed written consent was obtained from the patient's daughter after a thorough discussion of the procedural risks, benefits and alternatives. All questions were addressed. Maximal Sterile Barrier Technique was utilized including caps, mask, sterile gowns, sterile gloves, sterile drape, hand hygiene and skin antiseptic. A timeout was performed prior to the initiation of the procedure. The right groin  was prepped and draped in the usual sterile fashion. Using a micropuncture kit and the modified Seldinger technique, access was gained to the right common femoral artery and an 8 French sheath was placed. Real-time ultrasound guidance was utilized for vascular access including the acquisition of a permanent ultrasound image documenting patency of the accessed vessel. Under fluoroscopy, an 8 [place] emboguard guide catheter was navigated over a 6 [place] Berenstein 2 catheter and a 0.035" Terumo Glidewire into the aortic arch. The catheter was placed into the right common carotid artery. Frontal and lateral angiograms of the neck and skull base were obtained. Attempted navigation through and occluded right carotid bulb with a phenom 21 microcatheter and Aristotle 14 microguidewire proved unsuccessful. Next, a Berenstein 2 catheter was navigated over a 0.035" Terumo Glidewire through the carotid occlusion into the proximal cervical segment of the right ICA. Frontal and lateral angiograms of the head and neck were obtained. FINDINGS: 1. Patent right common femoral artery. 2. 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 3. Opacification of the ophthalmic  segment of the right ICA via right ophthalmic artery, suggesting chronic occlusion. Patent right MCA noted with possible nonocclusive filling defect at the distal M1 segment. 4. Decrease caliber and irregular lumen of the cervical right ICA with non flow limiting dissection at the proximal segment noted after crossing the carotid bulb occlusion. Near occlusion of the distal right M1/MCA with minimal contrast penetration M2 branches. PROCEDURE: The Berenstein 2 catheter was exchanged over the wire for an exchange length 0.018 " command microwire for a large bore catheter navigated over a phenom 21 microcatheter. The catheter was placed into the cavernous segment of the right ICA. Frontal and lateral angiograms of the head were obtained. Near occlusion of the distal right M1/MCA segment confirmed. Under biplane roadmap, a large bore aspiration catheter was navigated over a phenom 21 microcatheter and a synchro support microguidewire into the cavernous segment of the right ICA. The microcatheter was then navigated over the wire into the left M2/MCA posterior division branch. Then, a 5 x 37 mm embotrap stent retriever was deployed spanning the M1 M2 segment. The device was allowed to intercalated with the clot for 4 minutes. The microcatheter was removed. The aspiration catheter was advanced to the level of occlusion and connected to a penumbra aspiration pump. The guiding catheter was advanced into the upper cervical segment of the right ICA and the balloon was inflated. The thrombectomy device and aspiration catheter were removed under constant aspiration. Right internal carotid artery angiogram showed complete recanalization of the right MCA vascular tree (TICI3). The guiding catheter was then retracted into the right common carotid artery. Frontal and lateral angiograms of the neck were obtained. Decrease caliber of cervical right ICA with prominent lumen irregularity and ulceration at the bulb. Flat panel CT of the  head was obtained and post processed in a separate workstation with concurrent attending physician supervision. Selected images were sent to PACS. No evidence of hemorrhagic complication. Right common carotid artery angiograms were obtained with frontal and lateral views of the neck, in the working projections. Next, a 6-8 x 40 mm XACT carotid stent was deployed across the right carotid bifurcation. Frontal and lateral angiograms of the neck showed good position of the carotid stent with moderate in stent stenosis. In stent angioplasty was then performed using a 4 x 30 mm Viatrac balloon with resolution of the InStent stenosis. Follow-up right common carotid artery angiogram showed progressed improvement of the  caliber of the cervical right ICA distal to the stent with unimpeded flow through the stent. Right common carotid artery angiograms with frontal and lateral views of the head showed no evidence of thromboembolic complication with excellent anterograde flow in the right anterior circulation. Delay angiograms of the right common carotid artery with frontal and lateral views of the neck showed stable appearance of the recently deployed stent without evidence of in stent clot formation. Non flow limiting dissection of the mid cervical segment appears stable. The catheter was subsequently withdrawn. Right common femoral artery angiograms with frontal and lateral views showed access at the mid segment of the right common femoral artery which has adequate caliber for closure device. The femoral sheath was exchanged over the wire for a Perclose pro style which was utilized for access closure. Immediate hemostasis was achieved. 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 Complete revascularization of the right MCA achieved (TICI 3). No evidence of hemorrhagic or thromboembolic  complication. PLAN: 1. Transition to dual antiplatelet therapy with aspirin and Plavix once cangrelor drip is discontinued. 2. Transferred to ICU for continued care. 3. Goal SBP 120-140 mgHg. Electronically Signed   By: Pedro Earls M.D.   On: 09/14/2020 15:27   CT CEREBRAL PERFUSION W CONTRAST  Result Date: 09/11/2020 CLINICAL DATA:  Progressive left-sided weakness. EXAM: CT ANGIOGRAPHY HEAD AND NECK CT PERFUSION BRAIN TECHNIQUE: Multidetector CT imaging of the head and neck was performed using the standard protocol during bolus administration of intravenous contrast. Multiplanar CT image reconstructions and MIPs were obtained to evaluate the vascular anatomy. Carotid stenosis measurements (when applicable) are obtained utilizing NASCET criteria, using the distal internal carotid diameter as the denominator. Multiphase CT imaging of the brain was performed following IV bolus contrast injection. Subsequent parametric perfusion maps were calculated using RAPID software. CONTRAST:  137m OMNIPAQUE IOHEXOL 350 MG/ML SOLN COMPARISON:  MR head without contrast, CT head without contrast, and CTA head and neck 09/10/2020. FINDINGS: CTA NECK FINDINGS Aortic arch: No significant interval change in the 3 vessel aortic arch with atherosclerotic calcifications. Right carotid system: The right common carotid artery is unremarkable. The right ICA is occluded at the bifurcation without significant reconstitution in the neck. Left carotid system: The left common carotid artery is within normal limits. Dense calcifications are present at the proximal ICA without a significant stenosis relative to the more distal vessel. There is mild tortuosity without significant stenosis in the cervical ICA. Vertebral arteries: Left vertebral artery is dominant vessel. Both vertebral arteries originate from the subclavian arteries without significant stenosis. No significant stenosis is present in either vertebral artery in the  neck. Skeleton: Multilevel degenerative changes are present. Anterior osteophytes are fused at C3-4. No significant interval change. Other neck: No acute abnormality or significant interval change. Upper chest: Lung apices are clear.  Thoracic inlet is normal. Review of the MIP images confirms the above findings CTA HEAD FINDINGS Anterior circulation: The right internal carotid artery remains occluded through the ophthalmic segment. Atherosclerotic changes are present the cavernous left ICA without significant stenosis through the ICA terminus. The left A1 segment and right A1 segment are similar the prior exam. Proximal right M1 segment narrowing has progressed. Is some irregularity in the left M1 segment without significant stenosis. There is significant loss of perfusion right MCA branch vessels compared to prior exam. Left MCA branch vessels remain  intact. ACA vessels are unchanged. Posterior circulation: Moderate narrowing is present at the dural margin of the right vertebral artery, progressive from the prior exam. Left vertebral artery is unchanged. Vertebrobasilar junction normal. The basilar artery is small. Both posterior cerebral arteries originate from the basilar tip. Prominent right posterior communicating artery is present. There is segmental attenuation PCA branch vessels similar the prior study. Venous sinuses: Dural sinuses are patent. Straight sinus and deep cerebral veins are intact. Anatomic variants: None Review of the MIP images confirms the above findings CT Brain Perfusion Findings: ASPECTS: 10/10 CBF (<30%) Volume: 80m Perfusion (Tmax>6.0s) volume: 1661mMismatch Volume: 16271mnfarction Location:Right MCA territory IMPRESSION: 1. 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 2. 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 3. Progressive proximal right M1 segment stenosis with significant loss of  perfusion in the right MCA branch vessels. 4. Stable atherosclerotic changes at the left carotid bifurcation without significant stenosis. 5. 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 Basilar artery is similar the prior exam. 6.  Aortic Atherosclerosis (ICD10-I70.0). Electronically Signed   By: ChrSan MorelleD.   On: 09/11/2020 20:25   ECHOCARDIOGRAM COMPLETE  Result Date: 09/12/2020    ECHOCARDIOGRAM REPORT   Patient Name:   Debra SLAGERte of Exam: 09/12/2020 Medical Rec #:  [id number]:       70.0 in Accession #:    [id number]:       210.0 lb Date of Birth:  [birth year]SA:          2.131 m Patient Age:    [age]ars    BP:           99/45 mmHg Patient Gender: F           HR:           36 bpm. Exam Location:  Inpatient Procedure: 2D Echo, Cardiac Doppler and Color Doppler Indications:    TIA  History:        Patient has no prior history of Echocardiogram examinations.                 CHF, COPD; Risk Factors:Hypertension and Diabetes.  Sonographer:    TAMLuisa HartCS Referring Phys: [id number] Comments: No cardiac surgery noted in chart. IMPRESSIONS  1. Left ventricular ejection fraction, by estimation, is 60 to 65%. The left ventricle has normal function. The left ventricle has no regional wall motion abnormalities. There is mild left ventricular hypertrophy. Left ventricular diastolic parameters are consistent with Grade I diastolic dysfunction (impaired relaxation).  2. Right ventricular systolic function is normal. The right ventricular size is normal. Tricuspid regurgitation signal is inadequate for assessing PA pressure.  3. Left atrial size was moderately dilated.  4. Right atrial size was mildly dilated.  5. The mitral valve is normal in structure. Trivial mitral valve regurgitation. No evidence of mitral stenosis.  6. The aortic valve is grossly normal. There is mild calcification of the aortic valve. Aortic valve  regurgitation is not visualized. No aortic stenosis is present.  7. Aortic dilatation noted. There is borderline dilatation of the ascending aorta, measuring 39 mm.  8. The inferior vena cava is normal in size with greater than 50% respiratory variability, suggesting right atrial pressure of 3 mmHg. Conclusion(s)/Recommendation(s): No intracardiac source of embolism detected on this transthoracic study. A transesophageal echocardiogram is recommended to  exclude cardiac source of embolism if clinically indicated. FINDINGS  Left Ventricle: Left ventricular ejection fraction, by estimation, is 60 to 65%. The left ventricle has normal function. The left ventricle has no regional wall motion abnormalities. The left ventricular internal cavity size was normal in size. There is  mild left ventricular hypertrophy. Left ventricular diastolic parameters are consistent with Grade I diastolic dysfunction (impaired relaxation). Right Ventricle: The right ventricular size is normal. No increase in right ventricular wall thickness. Right ventricular systolic function is normal. Tricuspid regurgitation signal is inadequate for assessing PA pressure. Left Atrium: Left atrial size was moderately dilated. Right Atrium: Right atrial size was mildly dilated. Pericardium: There is no evidence of pericardial effusion. Mitral Valve: The mitral valve is normal in structure. Trivial mitral valve regurgitation. No evidence of mitral valve stenosis. Tricuspid Valve: The tricuspid valve is normal in structure. Tricuspid valve regurgitation is not demonstrated. No evidence of tricuspid stenosis. Aortic Valve: The aortic valve is grossly normal. There is mild calcification of the aortic valve. Aortic valve regurgitation is not visualized. No aortic stenosis is present. Aortic valve mean gradient measures 4.0 mmHg. Aortic valve peak gradient measures 9.5 mmHg. Aortic valve area, by VTI measures 3.12 cm. Pulmonic Valve: The pulmonic valve was  normal in structure. Pulmonic valve regurgitation is trivial. No evidence of pulmonic stenosis. Aorta: Aortic dilatation noted. There is borderline dilatation of the ascending aorta, measuring 39 mm. Venous: The inferior vena cava is normal in size with greater than 50% respiratory variability, suggesting right atrial pressure of 3 mmHg. IAS/Shunts: No atrial level shunt detected by color flow Doppler. EKG: Rhythm strip during this exam demostrated sinus bradycardia.  LEFT VENTRICLE PLAX 2D LVIDd:         5.20 cm      Diastology LV PW:         1.20 cm      LV e' medial:    5.11 cm/s LV IVS:        1.30 cm      LV E/e' medial:  15.2 LVOT diam:     2.30 cm      LV e' lateral:   6.57 cm/s LV SV:         115          LV E/e' lateral: 11.9 LV SV Index:   54 LVOT Area:     4.15 cm  LV Volumes (MOD) LV vol d, MOD A2C: 63.5 ml LV vol d, MOD A4C: 130.0 ml LV vol s, MOD A2C: 27.5 ml LV vol s, MOD A4C: 27.9 ml LV SV MOD A2C:     36.0 ml LV SV MOD A4C:     130.0 ml LV SV MOD BP:      68.8 ml RIGHT VENTRICLE RV S prime:     16.30 cm/s  PULMONARY VEINS TAPSE (M-mode): 3.0 cm      A Reversal Duration: 113.00 msec                             A Reversal Velocity: 32.80 cm/s                             Diastolic Velocity:  69.48 cm/s                             S/D Velocity:  1.40                             Systolic Velocity:   87.56 cm/s LEFT ATRIUM             Index LA diam:        4.30 cm 2.02 cm/m LA Vol (A2C):   95.6 ml 44.86 ml/m LA Vol (A4C):   80.7 ml 37.87 ml/m LA Biplane Vol: 90.0 ml 42.23 ml/m  AORTIC VALVE                   PULMONIC VALVE AV Area (Vmax):    3.26 cm    PV Vmax:       1.03 m/s AV Area (Vmean):   3.10 cm    PV Vmean:      68.500 cm/s AV Area (VTI):     3.12 cm    PV VTI:        0.305 m AV Vmax:           154.00 cm/s PV Peak grad:  4.2 mmHg AV Vmean:          94.300 cm/s PV Mean grad:  2.0 mmHg AV VTI:            0.367 m AV Peak Grad:      9.5 mmHg AV Mean Grad:      4.0 mmHg LVOT Vmax:          121.00 cm/s LVOT Vmean:        70.300 cm/s LVOT VTI:          0.276 m LVOT/AV VTI ratio: 0.75  AORTA Ao Root diam: 3.40 cm Ao Asc diam:  3.90 cm MITRAL VALVE MV Area (PHT): 2.48 cm    SHUNTS MV Decel Time: 306 msec    Systemic VTI:  0.28 m MV E velocity: 77.90 cm/s  Systemic Diam: 2.30 cm MV A velocity: 94.00 cm/s MV E/A ratio:  0.83 Cherlynn Kaiser MD Electronically signed by Cherlynn Kaiser MD Signature Date/Time: 09/12/2020/4:17:21 PM    Final    IR PERCUTANEOUS ART THROMBECTOMY/INFUSION INTRACRANIAL INC DIAG ANGIO  Result Date: 09/14/2020 INDICATION: 75 year old female with past medical history significant for hypertension, COPD, aortic aneurysm, DVT in pregnancy, former smoking. She was admitted on 09/10/2020 due to history of dizziness and imbalance for 2-3 weeks. 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 Initial NIHSS was 6. However, symptoms resolved during patient evaluation. On 09/11/2020, patient was initially evaluated in the morning and found to have NIHSS of 1 for dysarthria which she refers as her baseline. At 7:25 p.m., patient was found to have new onset of left-sided weakness and neglect, NIHSS 8. Her last known at baseline was 7 p.m. repeat CT angiogram of the head and neck showed new stenosis of the right M1 segment. Case was discussed in the family with details by Dr. Amie Portland, MD. Decision was made to proceed with a diagnostic cerebral angiogram and mechanical thrombectomy. EXAM: ULTRASOUND-GUIDED VASCULAR ACCESS DIAGNOSTIC CEREBRAL ANGIOGRAM MECHANICAL THROMBECTOMY RIGHT INTERNAL CAROTID ARTERY ANGIOPLASTY AND STENTING FLAT PANEL HEAD CT COMPARISON:  CT/CT angiogram of the head and neck Sep 11, 2020 MEDICATIONS: Cangrelor IV bolus and drip. ANESTHESIA/SEDATION: The procedure was performed under general anesthesia. CONTRAST:  80 mL of Omnipaque 240 milligram/mL FLUOROSCOPY TIME:  Fluoroscopy Time: 74  minutes 36 seconds (2,200  mGy). COMPLICATIONS: None immediate. TECHNIQUE: Informed written consent was obtained from the patient's daughter after a thorough discussion of the procedural risks, benefits and alternatives. All questions were addressed. Maximal Sterile Barrier Technique was utilized including caps, mask, sterile gowns, sterile gloves, sterile drape, hand hygiene and skin antiseptic. A timeout was performed prior to the initiation of the procedure. The right groin was prepped and draped in the usual sterile fashion. Using a micropuncture kit and the modified Seldinger technique, access was gained to the right common femoral artery and an 8 French sheath was placed. Real-time ultrasound guidance was utilized for vascular access including the acquisition of a permanent ultrasound image documenting patency of the accessed vessel. Under fluoroscopy, an 8 [place] emboguard guide catheter was navigated over a 6 [place] Berenstein 2 catheter and a 0.035" Terumo Glidewire into the aortic arch. The catheter was placed into the right common carotid artery. Frontal and lateral angiograms of the neck and skull base were obtained. Attempted navigation through and occluded right carotid bulb with a phenom 21 microcatheter and Aristotle 14 microguidewire proved unsuccessful. Next, a Berenstein 2 catheter was navigated over a 0.035" Terumo Glidewire through the carotid occlusion into the proximal cervical segment of the right ICA. Frontal and lateral angiograms of the head and neck were obtained. FINDINGS: 1. Patent right common femoral artery. 2. 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 3. Opacification of the ophthalmic segment of the right ICA via right ophthalmic artery, suggesting chronic occlusion. Patent right MCA noted with possible nonocclusive filling defect at the distal M1 segment. 4. Decrease caliber and irregular lumen of the cervical right ICA with non flow limiting  dissection at the proximal segment noted after crossing the carotid bulb occlusion. Near occlusion of the distal right M1/MCA with minimal contrast penetration M2 branches. PROCEDURE: The Berenstein 2 catheter was exchanged over the wire for an exchange length 0.018 " command microwire for a large bore catheter navigated over a phenom 21 microcatheter. The catheter was placed into the cavernous segment of the right ICA. Frontal and lateral angiograms of the head were obtained. Near occlusion of the distal right M1/MCA segment confirmed. Under biplane roadmap, a large bore aspiration catheter was navigated over a phenom 21 microcatheter and a synchro support microguidewire into the cavernous segment of the right ICA. The microcatheter was then navigated over the wire into the left M2/MCA posterior division branch. Then, a 5 x 37 mm embotrap stent retriever was deployed spanning the M1 M2 segment. The device was allowed to intercalated with the clot for 4 minutes. The microcatheter was removed. The aspiration catheter was advanced to the level of occlusion and connected to a penumbra aspiration pump. The guiding catheter was advanced into the upper cervical segment of the right ICA and the balloon was inflated. The thrombectomy device and aspiration catheter were removed under constant aspiration. Right internal carotid artery angiogram showed complete recanalization of the right MCA vascular tree (TICI3). The guiding catheter was then retracted into the right common carotid artery. Frontal and lateral angiograms of the neck were obtained. Decrease caliber of cervical right ICA with prominent lumen irregularity and ulceration at the bulb. Flat panel CT of the head was obtained and post processed in a separate workstation with concurrent attending physician supervision. Selected images were sent to PACS. No evidence of hemorrhagic complication. Right common carotid artery angiograms were obtained with frontal and  lateral views of the neck, in the working projections. Next, a 6-8  x 40 mm XACT carotid stent was deployed across the right carotid bifurcation. Frontal and lateral angiograms of the neck showed good position of the carotid stent with moderate in stent stenosis. In stent angioplasty was then performed using a 4 x 30 mm Viatrac balloon with resolution of the InStent stenosis. Follow-up right common carotid artery angiogram showed progressed improvement of the caliber of the cervical right ICA distal to the stent with unimpeded flow through the stent. Right common carotid artery angiograms with frontal and lateral views of the head showed no evidence of thromboembolic complication with excellent anterograde flow in the right anterior circulation. Delay angiograms of the right common carotid artery with frontal and lateral views of the neck showed stable appearance of the recently deployed stent without evidence of in stent clot formation. Non flow limiting dissection of the mid cervical segment appears stable. The catheter was subsequently withdrawn. Right common femoral artery angiograms with frontal and lateral views showed access at the mid segment of the right common femoral artery which has adequate caliber for closure device. The femoral sheath was exchanged over the wire for a Perclose pro style which was utilized for access closure. Immediate hemostasis was achieved. 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 Complete revascularization of the right MCA achieved (TICI 3). No evidence of hemorrhagic or thromboembolic complication. PLAN: 1. Transition to dual antiplatelet therapy with aspirin and Plavix once cangrelor drip is discontinued. 2. Transferred to ICU for continued care. 3. Goal SBP 120-140 mgHg. Electronically Signed   By: Pedro Earls M.D.    On: 09/14/2020 15:27   CT HEAD CODE STROKE WO CONTRAST  Result Date: 09/11/2020 CLINICAL DATA:  Code stroke. Neuro deficit. Acute stroke suspected. Dizziness and unsteady gait for 2 weeks. EXAM: CT HEAD WITHOUT CONTRAST TECHNIQUE: Contiguous axial images were obtained from the base of the skull through the vertex without intravenous contrast. COMPARISON:  CT head without contrast and CTA head and neck 09/10/2020. FINDINGS: Brain: Right parietal acute/subacute infarct stable. No new infarct present. Basal ganglia are intact. Insular ribbon normal. The ventricles are of normal size. No significant extraaxial fluid collection is present. Vascular: Atherosclerotic calcifications are present within the cavernous internal carotid arteries bilaterally. No hyperdense vessel is present. Skull: Calvarium is intact. No focal lytic or blastic lesions are present. Sinuses/Orbits: Bilateral maxillary sinus fluid collections are present. Scattered ethmoid opacification noted. Sphenoid sinuses are clear. Frontal sinuses demonstrate mild mucosal thickening. Mastoid air cells are clear. The globes and orbits are within normal limits. ASPECTS [facility] Stroke Program Early CT Score) - Ganglionic level infarction (caudate, lentiform nuclei, internal capsule, insula, M1-M3 cortex): 7/7 - Supraganglionic infarction (M4-M6 cortex): 3/3 Total score (0-10 with 10 being normal): 10/10 IMPRESSION: 1. Stable appearance of right parietal acute/subacute infarct. 2. No new infarct. 3. ASPECTS is 10/10. Electronically Signed   By: San Morelle M.D.   On: 09/11/2020 20:00   CT HEAD CODE STROKE WO CONTRAST  Result Date: 09/10/2020 CLINICAL DATA:  Code stroke.  Acute neuro deficit EXAM: CT HEAD WITHOUT CONTRAST TECHNIQUE: Contiguous axial images were obtained from the base of the skull through the vertex without intravenous contrast. COMPARISON:  None. FINDINGS: Brain: Ventricle size normal. Mild atrophy. Chronic infarct right parietal  lobe. Mild white matter hypodensity bilaterally most consistent with microvascular ischemia Vascular: Negative for hyperdense vessel Skull: Negative Sinuses/Orbits: Mucosal edema  in the paranasal sinuses. Air-fluid levels in both maxillary sinus. Mastoid clear bilaterally. Other: None ASPECTS ([facility] Stroke Program Early CT Score) - Ganglionic level infarction (caudate, lentiform nuclei, internal capsule, insula, M1-M3 cortex): 7 - Supraganglionic infarction (M4-M6 cortex): 3 Total score (0-10 with 10 being normal): 10 IMPRESSION: 1. No acute abnormality. 2. Chronic infarct right parietal lobe. Microvascular ischemic change in the white matter 3. ASPECTS is 10 4. These results were called by telephone at the time of interpretation on 09/10/2020 at 4:07 pm to provider Eulis Foster , who verbally acknowledged these results. Electronically Signed   By: Franchot Gallo M.D.   On: 09/10/2020 16:08   CT ANGIO HEAD NECK W WO CM W PERF  Result Date: 09/11/2020 CLINICAL DATA:  Progressive left-sided weakness. EXAM: CT ANGIOGRAPHY HEAD AND NECK CT PERFUSION BRAIN TECHNIQUE: Multidetector CT imaging of the head and neck was performed using the standard protocol during bolus administration of intravenous contrast. Multiplanar CT image reconstructions and MIPs were obtained to evaluate the vascular anatomy. Carotid stenosis measurements (when applicable) are obtained utilizing NASCET criteria, using the distal internal carotid diameter as the denominator. Multiphase CT imaging of the brain was performed following IV bolus contrast injection. Subsequent parametric perfusion maps were calculated using RAPID software. CONTRAST:  135m OMNIPAQUE IOHEXOL 350 MG/ML SOLN COMPARISON:  MR head without contrast, CT head without contrast, and CTA head and neck 09/10/2020. FINDINGS: CTA NECK FINDINGS Aortic arch: No significant interval change in the 3 vessel aortic arch with atherosclerotic calcifications. Right carotid system: The right common  carotid artery is unremarkable. The right ICA is occluded at the bifurcation without significant reconstitution in the neck. Left carotid system: The left common carotid artery is within normal limits. Dense calcifications are present at the proximal ICA without a significant stenosis relative to the more distal vessel. There is mild tortuosity without significant stenosis in the cervical ICA. Vertebral arteries: Left vertebral artery is dominant vessel. Both vertebral arteries originate from the subclavian arteries without significant stenosis. No significant stenosis is present in either vertebral artery in the neck. Skeleton: Multilevel degenerative changes are present. Anterior osteophytes are fused at C3-4. No significant interval change. Other neck: No acute abnormality or significant interval change. Upper chest: Lung apices are clear.  Thoracic inlet is normal. Review of the MIP images confirms the above findings CTA HEAD FINDINGS Anterior circulation: The right internal carotid artery remains occluded through the ophthalmic segment. Atherosclerotic changes are present the cavernous left ICA without significant stenosis through the ICA terminus. The left A1 segment and right A1 segment are similar the prior exam. Proximal right M1 segment narrowing has progressed. Is some irregularity in the left M1 segment without significant stenosis. There is significant loss of perfusion right MCA branch vessels compared to prior exam. Left MCA branch vessels remain intact. ACA vessels are unchanged. Posterior circulation: Moderate narrowing is present at the dural margin of the right vertebral artery, progressive from the prior exam. Left vertebral artery is unchanged. Vertebrobasilar junction normal. The basilar artery is small. Both posterior cerebral arteries originate from the basilar tip. Prominent right posterior communicating artery is present. There is segmental attenuation PCA branch vessels similar the prior  study. Venous sinuses: Dural sinuses are patent. Straight sinus and deep cerebral veins are intact. Anatomic variants: None Review of the MIP images confirms the above findings CT Brain Perfusion Findings: ASPECTS: 10/10 CBF (<30%) Volume: 054mPerfusion (Tmax>6.0s) volume: 16248mismatch Volume: 162m74mfarction Location:Right MCA territory IMPRESSION: 1. Right MCA territory  ischemia with 162 mL of surrounding penumbra. 2. 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 3. Progressive proximal right M1 segment stenosis with significant loss of perfusion in the right MCA branch vessels. 4. Stable atherosclerotic changes at the left carotid bifurcation without significant stenosis. 5. 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 Basilar artery is similar the prior exam. 6.  Aortic Atherosclerosis (ICD10-I70.0). Electronically Signed   By: San Morelle M.D.   On: 09/11/2020 20:25    Labs:  Basic Metabolic Panel: No results for input(s): NA, K, CL, CO2, GLUCOSE, BUN, CREATININE, CALCIUM, MG, PHOS in the last 168 hours.  CBC: No results for input(s): WBC, NEUTROABS, HGB, HCT, MCV, PLT in the last 168 hours.  CBG: No results for input(s): GLUCAP in the last 168 hours.  Family history.  Positive for hypertension as well as hyperlipidemia.  Denies any colon cancer esophageal cancer or rectal cancer  Brief HPI:   [name] is a 75 y.o. right-handed female with history of COPD, DVT during pregnancy, hypertension, thoracicoabdominal aortic aneurysm, quit smoking 33 years ago, questionable medical noncompliance.  Per chart review lives with her family.  Independent prior to admission.  Two-level home bed and bath upstairs.  Independent ADLs.  She does not drive.  Presented 09/10/2020 with dizziness and unsteady gait that persisted over 2 to 3 weeks as well as intermittent left-sided weakness.  CT/MRI  showed right parietal small to moderate size acute to subacute infarction as well as few additional punctate acute to subacute right centrum semiovale infarctions.  No evidence of hemorrhage.  CT angiogram of head and neck age-indeterminate occlusion of the right cervical ICA just beyond its origin.  At least partial reconstitution at the clinoid intracranially.  Less than 50% stenosis of the left ICA origin.  Admission chemistries unremarkable except potassium 3.3.  Patient underwent right ICA mechanical thrombectomy revascularization per interventional radiology 09/12/2020.  Maintained on DAPT aspirin 81 mg daily and Brilinta for CVA prophylaxis x3 months.  Cleviprex initially added for blood pressure control.  Patient developed increasing right hand pain and numbness follow-up CT of the head 09/15/2020 showed interval development of scattered SAH involving the right sylvian fissure and overlying right frontal cortical sulci.  Small volume intraventricular hemorrhage seen as well as likely related redistribution.  No hydrocephalus or ventricular trapping.  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  Due to patient's left-sided weakness and decreased functional mobility was admitted for a comprehensive rehab program   Hospital Course: [name] was admitted to rehab 09/16/2020 for inpatient therapies to consist of PT, ST and OT at least three hours five days a week. Past admission physiatrist, therapy team and rehab RN have worked together to provide customized collaborative inpatient rehab.  Pertaining to patient's small to moderate acute to subacute infarct as well as few additional punctate acute to subacute right centrum semiovale infarction status post right ICA revascularization.  Patient would remain on aspirin as well as Brilinta x3 months and follow-up neurology services as well as interventional  radiology.  Blood pressures controlled on Norvasc titrated to 5 mg 09/17/2020.  Lipitor ongoing for hyperlipidemia.  History of COPD remote tobacco abuse oxygen saturations maintained greater than 90%.  There was some question of possible medical noncompliance again patient receive counts regards to maintaining medical regimen.  Right shoulder pain x-ray suggesting  some previous trauma nothing acute.  Acute blood loss anemia 10.8 no bleeding episodes   Blood pressures were monitored on TID basis and controlled     Rehab course: During patient's stay in rehab weekly team conferences were held to monitor patient's progress, set goals and discuss barriers to discharge. At admission, patient required minimal assist 120 feet rolling walker minimal guard sit to stand minimal guard lateral scoot transfers minimal assist upper body bathing max is lower body bathing minimal assist lower body dressing max assist lower body dressing  Physical exam.  Blood pressure 122/98 pulse 56 temperature 98.2 respirations 16 oxygen saturation is 96% room air Constitutional.  No acute distress HEENT Head.  Normocephalic and atraumatic Eyes.  Pupils round and reactive to light no discharge without nystagmus Neck.  Supple nontender no JVD without thyromegaly Cardiac regular rate rhythm not any extra sounds or murmur heard Abdomen.  Soft nontender positive bowel sounds without rebound Respiratory effort normal no respiratory distress without wheeze Skin.  Warm and dry Neurologic.  Alert oriented x3 follows full commands.  Sensation intact to light touch Motor.  Right upper extremity/right lower extremity 5/5 proximal to distal Left upper extremity/left lower extremity 4 - 4+/5 proximal to distal  He/She  has had improvement in activity tolerance, balance, postural control as well as ability to compensate for deficits. He/She has had improvement in functional use RUE/LUE  and RLE/LLE as well as improvement in awareness.   She is able to perform supine to sit with minimal assist sit to stand using rolling walker minimal assist stand pivot transfers using rolling walker minimal assist ambulate 140-200 feet with and without assistive device navigating 4 stairs bilateral handrails minimal assist.  Patient required minimal assist with bed rail to sit edge of bed.  Required minimal assist and moderate cueing for STS and stand pivot transfers to wheelchair with rolling walker for ADLs.  Full family teaching completed plan discharged to home       Disposition: Discharged to home    Diet: Regular  Special Instructions: No driving smoking or alcohol  Medications at discharge 1.  Tylenol as needed 2.  Norvasc 5 mg p.o. daily 3.  Aspirin 81 mg p.o. daily 4.  Lipitor 80 mg p.o. nightly 5.  Provigil 100 mg p.o. daily 6.  Brilinta 45 mg p.o. daily  30-35 minutes were spent completing discharge summary and discharge planning  Discharge Instructions    Ambulatory referral to Neurology   Complete by: As directed    An appointment is requested in approximately 4 weeks right parietal right centrum semiovale infarction   Ambulatory referral to Physical Medicine Rehab   Complete by: As directed    Moderate complexity follow-up 1 to 2 weeks right parietal centrum semiovale infarction       Follow-up Information    Kirsteins, Luanna Salk, MD Follow up.   Specialty: Physical Medicine and Rehabilitation Why: Office to call for appointment Contact information: [address] [place] [address] [phone]               Signed: Cathlyn Parsons 09/28/2020, 5:11 AM

## 2020-09-26 NOTE — Progress Notes (Signed)
PROGRESS NOTE   Subjective/Complaints: Patient seen laying in bed this morning.  She states she slept well overnight.  No reported issues overnight.  Limited engagement.  She was seen by [facility] yesterday, notes reviewed- recommending DAPT.  ROS: Limited due to cognition/participation  Objective:   No results found. No results for input(s): WBC, HGB, HCT, PLT in the last 72 hours. No results for input(s): NA, K, CL, CO2, GLUCOSE, BUN, CREATININE, CALCIUM in the last 72 hours.  Intake/Output Summary (Last 24 hours) at 09/26/2020 0901 Last data filed at 09/25/2020 1801 Gross per 24 hour  Intake 200 ml  Output --  Net 200 ml        Physical Exam: Vital Signs Blood pressure (!) 151/68, pulse (!) 58, temperature 97.6 F (36.4 C), resp. rate 20, height 5\' 10"  (1.778 m), weight 87.7 kg, SpO2 100 %. Constitutional: No distress . Vital signs reviewed. HENT: Normocephalic.  Atraumatic. Eyes: EOMI. No discharge. Cardiovascular: No JVD.  RRR. Respiratory: Normal effort.  No stridor.  Bilateral clear to auscultation. GI: Non-distended.  BS +. Skin: Warm and dry.  Intact. Psych: Flat.  Disengaged. Musc: No edema in extremities.  No tenderness in extremities. Neuro: Alert RUE/RLE: 5/5 proximal distal LUE/LLE:?  4 -/5 proximal distal  Assessment/Plan: 1. Functional deficits which require 3+ hours per day of interdisciplinary therapy in a comprehensive inpatient rehab setting.  Physiatrist is providing close team supervision and 24 hour management of active medical problems listed below.  Physiatrist and rehab team continue to assess barriers to discharge/monitor patient progress toward functional and medical goals  Care Tool:  Bathing    Body parts bathed by patient: Right arm,Left arm,Chest,Abdomen,Front perineal area,Buttocks,Right upper leg,Left upper leg,Face,Right lower leg,Left lower leg   Body parts bathed by helper: Right  lower leg,Left lower leg     Bathing assist Assist Level: Minimal Assistance - Patient > 75%     Upper Body Dressing/Undressing Upper body dressing   What is the patient wearing?: Pull over shirt    Upper body assist Assist Level: Minimal Assistance - Patient > 75%    Lower Body Dressing/Undressing Lower body dressing      What is the patient wearing?: Pants,Incontinence brief     Lower body assist Assist for lower body dressing: Moderate Assistance - Patient 50 - 74%     Toileting Toileting    Toileting assist Assist for toileting: Minimal Assistance - Patient > 75%     Transfers Chair/bed transfer  Transfers assist     Chair/bed transfer assist level: Minimal Assistance - Patient > 75% Chair/bed transfer assistive device:   Ambulation assist      Assist level: Minimal Assistance - Patient > 75% Assistive device: No Device Max distance: 279ft   Walk 10 feet activity   Assist     Assist level: Minimal Assistance - Patient > 75% Assistive device: No Device   Walk 50 feet activity   Assist    Assist level: Minimal Assistance - Patient > 75% Assistive device: No Device    Walk 150 feet activity   Assist Walk 150 feet activity did not occur: Safety/medical concerns  Assist  level: Minimal Assistance - Patient > 75% Assistive device: No Device    Walk 10 feet on uneven surface  activity   Assist Walk 10 feet on uneven surfaces activity did not occur: Safety/medical concerns         Wheelchair     Assist Will patient use wheelchair at discharge?: No Type of Wheelchair: Manual    Wheelchair assist level: Minimal Assistance - Patient > 75% Max wheelchair distance: 25    Wheelchair 50 feet with 2 turns activity    Assist            Wheelchair 150 feet activity     Assist          Blood pressure (!) 151/68, pulse (!) 58, temperature 97.6 F (36.4 C), resp. rate 20, height 5'  10" (1.778 m), weight 87.7 kg, SpO2 100 %.    Medical Problem List and Plan: 1.  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  Status post right ICA revascularization  Continue CIR  2.  Antithrombotics: -DVT/anticoagulation: SCDs             -antiplatelet therapy: Aspirin 81 mg daily, Brilinta 60 mg twice daily 3. Pain Management: Tylenol as needed  Appears controlled on 5/21 4. Mood: Provide emotional support             -antipsychotic agents: N/A 5. 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 Skin/Wound Care: Routine skin checks 7. Fluids/Electrolytes/Nutrition: Routine in and outs.   BMP ordered for Monday 8.  Hypertension.  Norvasc 2.5 mg daily.- increased to 5mg  starting 5/12 Vitals:   09/25/20 1934 09/26/20 0349  BP: (!) 147/64 (!) 151/68  Pulse: 63 (!) 58  Resp: 17 20  Temp: 98.6 F (37 C) 97.6 F (36.4 C)  SpO2: 100% 100%   ?  Trending up on 5/21, will consider medication adjustments if persistent             Monitor with increased mobility 9.    Hyperlipidemia: Lipitor 10.  History of COPD with remote tobacco use.               O2 Sats every shift             Questionable medical noncompliance.  Provide counseling 11.  Post CVA HA:off topiramatie no increased c/os use prn tylenol due to concerns about sedation   12.  Right shoulder pain  X-ray suggesting previous trauma  Controlled on 5/21 13.  Acute blood loss anemia  Hemoglobin 10.8 on 5/12, labs ordered for Monday  LOS: 10 days A FACE TO FACE EVALUATION WAS PERFORMED  Haila Dena Tuesday 09/26/2020, 9:01 AM

## 2020-09-27 MED ORDER — TICAGRELOR 90 MG PO TABS: 45.0000 mg | ORAL_TABLET | Freq: Every day | ORAL | 0 refills | Status: DC

## 2020-09-27 MED ORDER — AMLODIPINE BESYLATE 5 MG PO TABS: 5.0000 mg | ORAL_TABLET | Freq: Every day | ORAL | 0 refills | Status: DC

## 2020-09-27 MED ORDER — ACETAMINOPHEN 325 MG PO TABS: 650.0000 mg | ORAL_TABLET | ORAL | Status: DC | PRN

## 2020-09-27 MED ORDER — ATORVASTATIN CALCIUM 80 MG PO TABS: 80.0000 mg | ORAL_TABLET | Freq: Every day | ORAL | 0 refills | Status: DC

## 2020-09-27 NOTE — Plan of Care (Signed)
  Problem: Consults Goal: RH STROKE PATIENT EDUCATION Description: See Patient Education module for education specifics  Outcome: Progressing   Problem: RH BOWEL ELIMINATION Goal: RH STG MANAGE BOWEL WITH ASSISTANCE Description: STG Manage Bowel with mod I Assistance. Outcome: Progressing Goal: RH STG MANAGE BOWEL W/MEDICATION W/ASSISTANCE Description: STG Manage Bowel with Medication with mod I Assistance. Outcome: Progressing   Problem: RH SAFETY Goal: RH STG ADHERE TO SAFETY PRECAUTIONS W/ASSISTANCE/DEVICE Description: STG Adhere to Safety Precautions With cues/reminders Assistance/Device. Outcome: Progressing   Problem: RH KNOWLEDGE DEFICIT Goal: RH STG INCREASE KNOWLEDGE OF HYPERTENSION Description: Patient will be able to manage HTN with medications and dietary modifications using handouts and educational tools independently Outcome: Progressing Goal: RH STG INCREASE KNOWLEGDE OF HYPERLIPIDEMIA Description: Patient will be able to manage HLD with medications and dietary modifications using handouts and educational tools independently Outcome: Progressing Goal: RH STG INCREASE KNOWLEDGE OF STROKE PROPHYLAXIS Description: Patient will be able to manage secondary stroke risks with medications and dietary modifications using handouts and educational tools independently Outcome: Progressing   

## 2020-09-27 NOTE — Progress Notes (Signed)
Physical Therapy Discharge Summary  Patient Details  Name: [name] MRN: [id number] Date of Birth: 1945-10-04  Today's Date: 09/27/2020  Patient has met 3 of 9 long term goals due to improved activity tolerance, improved balance, improved postural control and ability to compensate for deficits.  Patient to discharge at an ambulatory level [place].   Patient's care partner unavailable to provide the necessary physical and cognitive assistance at discharge.  Patient's family did not show up for scheduled family education session and when called to be reminded of session declined to participate or be present for family training.  Reasons goals not met: Patient with generally very poor participation in scheduled therapy. She is also limited by poor initiation, poor attention to task, poor insight into deficits, poor attention to L and generally not responsive to therapeutic intervention to correct above mentioned deficits. Some goals downgraded to CGA with hopes of patient improving participation since some rx's were adjusted- however that was not the case.   Recommendation:  Patient will benefit from ongoing skilled PT services in home health setting to continue to advance safe functional mobility, address ongoing impairments in endurance, strength, balance, coordination, safety, independence with functional mobility, L inattention, and minimize fall risk.  Equipment: RW  Reasons for discharge: discharge from hospital  Patient/family agrees with progress made and goals achieved: Yes  PT Discharge Precautions/Restrictions Precautions Precautions: Fall Precaution Comments: L inattention Restrictions Weight Bearing Restrictions: No Pain Pain Assessment Pain Scale: 0-10 Pain Score: 0-No pain Vision/Perception  Perception Perception: Impaired Inattention/Neglect: Does not attend to left visual field Praxis Praxis: Impaired Praxis Impairment Details: Initiation  Cognition Overall  Cognitive Status: Impaired/Different from baseline Arousal/Alertness: Awake/alert Orientation Level: Oriented to person;Oriented to place;Oriented to situation Attention: Sustained;Selective Sustained Attention: Impaired Selective Attention: Impaired Memory: Impaired Memory Impairment: Retrieval deficit;Storage deficit Awareness: Impaired Awareness Impairment: Emergent impairment Problem Solving: Impaired Safety/Judgment: Impaired Sensation Sensation Light Touch: Appears Intact Proprioception: Appears Intact Coordination Gross Motor Movements are Fluid and Coordinated: No Fine Motor Movements are Fluid and Coordinated: No Coordination and Movement Description: L hemi Motor  Motor Motor: Hemiplegia;Abnormal postural alignment and control Motor - Skilled Clinical Observations: Mild L hemi with L inattention Motor - Discharge Observations: mild L hemi with L inattention  Mobility Bed Mobility Bed Mobility: Rolling Right;Rolling Left;Supine to Sit Rolling Right: Supervision/verbal cueing Rolling Left: Supervision/Verbal cueing Supine to Sit: Minimal Assistance - Patient > 75% Transfers Transfers: Sit to Stand;Stand Pivot Transfers Sit to Stand: Minimal Assistance - Patient > 75% Stand to Sit: Minimal Assistance - Patient > 75% Stand Pivot Transfers: Minimal Assistance - Patient > 75% Stand Pivot Transfer Details: Verbal cues for gait pattern;Verbal cues for precautions/safety Transfer (Assistive device): Rolling walker Locomotion  Gait Ambulation: Yes Gait Assistance: Contact Guard/Touching assist Gait Distance (Feet): 150 Feet Assistive device: Rolling walker Gait Assistance Details: Verbal cues for safe use of DME/AE;Verbal cues for gait pattern;Verbal cues for technique;Verbal cues for precautions/safety Gait Gait: Yes Gait Pattern: Impaired Gait Pattern: Step-through pattern;Decreased step length - right;Decreased stance time - left;Decreased dorsiflexion -  left;Decreased weight shift to left;Poor foot clearance - left;Trunk flexed;Left foot flat Gait velocity: decreased Stairs / Additional Locomotion Stairs: Yes Stairs Assistance: Contact Guard/Touching assist Stair Management Technique: One rail Right Number of Stairs: 4 Height of Stairs: 6 Wheelchair Mobility Wheelchair Mobility: No  Trunk/Postural Assessment  Cervical Assessment Cervical Assessment: Exceptions to [facility] (R gaze preference) Thoracic Assessment Thoracic Assessment: Exceptions to [facility] (rounded shoulders) Lumbar Assessment Lumbar Assessment: Exceptions to [facility] (posterior  pelvic tilt) Postural Control Postural Control: Deficits on evaluation Righting Reactions: delayed and inadeqaute Protective Responses: delayed and inadequate  Balance Balance Balance Assessed: Yes Static Sitting Balance Static Sitting - Balance Support: Feet supported Static Sitting - Level of Assistance: 5: Stand by assistance Dynamic Sitting Balance Dynamic Sitting - Balance Support: During functional activity;Feet supported Dynamic Sitting - Level of Assistance: 5: Stand by assistance Static Standing Balance Static Standing - Balance Support: Bilateral upper extremity supported;During functional activity Static Standing - Level of Assistance: 4: Min assist Dynamic Standing Balance Dynamic Standing - Balance Support: Bilateral upper extremity supported;During functional activity Dynamic Standing - Level of Assistance: 4: Min assist Extremity Assessment   RLE Assessment RLE Assessment: Within Functional Limits General Strength Comments: grossly 4+/5 LLE Assessment LLE Assessment: Exceptions to [facility] LLE Strength Left Hip Flexion: 3+/5 Left Hip ABduction: 4/5 Left Hip ADduction: 4/5 Left Knee Flexion: 4-/5 Left Knee Extension: 4/5 Left Ankle Dorsiflexion: 3/5 Left Ankle Plantar Flexion: 4-/5  Patients family choosing to not be present for scheduled family education session. Therefore, family  not educated on patients current level of assist required (grossly MinA with RW), poor attention to L, requirement of use of RW for safety, poor initiation, hypokinesis, decreased insight into deficits, gait abnormalities (including decreased L foot clearance and decreased L step length) and recommended f/u with HH to continue to improve above mentioned deficits.    Estevan Ryder, PT, DPT, CBIS Excell Seltzer, PT, DPT, CSRS 09/27/2020, 4:07 PM

## 2020-09-27 NOTE — Progress Notes (Signed)
Occupational Therapy Session Note  Patient Details  Name: [name] MRN: [id number] Date of Birth: November 06, 1945  Today's Date: 09/27/2020 OT Individual Time: 3664-4034 OT Individual Time Calculation (min): 41 min   Short Term Goals: Week 2:  OT Short Term Goal 1 (Week 2): Short term goals equal to LTGs set at supervision level overall.  Skilled Therapeutic Interventions/Progress Updates:    Pt greeted EOB with RN present. They were getting ready to transfer to the toilet. OT assisted pt with transfer, vcs and increased time for sit<stand using RW, CGA for ambulatory transfer to the elevated toilet with cues for increasing stride length on the Lt side/keeping Lt foot inside of RW. Min A for lowering clothing and then pt had +bladder void. Assistance for thoroughness with frontal hygiene due to residue. Posterior hygiene completed by therapist to ensure cleanliness. Mod A to elevate new brief/pants over hips, OT assisting pt with using her Lt hand at this time. Next pt ambulated to the sink with CGA using device to wash her hands given vcs. Note that she began walking away from sink before turning off the faucet. Pt declined transferring to the w/c, so returned EOB. We called her 2 daughters, leaving voicemail on daughter [name]'s cell phone to remind her of scheduled family education. We also called daughter [name], who stated that she was not coming in for family education and that her sister was probably working, unable to call sister when OT asked if this was possible. Pt declined participating in any other ADL tasks or therapeutic activities for her Lt hand. OT brought her a therapeutic activity printout, reviewed activities that she could participate in at home to improve functional use of the Lt hand. Pt appeared minimally receptive to education. She responded "no" to most questions but stated "yes" when asked if she wanted to return to bed. Min A and vcs for transition to supine position. Pt  remained in bed with all needs within reach and bed alarm set. Note that pt exhibited very slow movements and required increased time to execute transfers/tasks today.   Family did not show up for family education today  Therapy Documentation Precautions:  Precautions Precautions: Fall Precaution Comments: L inattention Restrictions Weight Bearing Restrictions: No Pain: pt stated "no" when asked if she had pain Pain Assessment Pain Scale: 0-10 Pain Score: 0-No pain ADL: ADL Eating: Set up Where Assessed-Eating: Wheelchair Grooming: Supervision/safety Where Assessed-Grooming: Wheelchair Upper Body Bathing: Supervision/safety Where Assessed-Upper Body Bathing: Chair,Shower Lower Body Bathing: Minimal assistance Where Assessed-Lower Body Bathing: Chair,Shower Upper Body Dressing: Minimal assistance Where Assessed-Upper Body Dressing: Other (Comment) (toilet) Lower Body Dressing: Moderate assistance Where Assessed-Lower Body Dressing: Other (Comment) (toilet) Toileting: Minimal assistance Where Assessed-Toileting: Teacher, adult education: Curator Method: Proofreader: Grab bars,Raised Copy: Insurance underwriter Method: Designer, industrial/product: Shower seat with back     Therapy/Group: Individual Therapy  Abundio Miu 09/27/2020, 12:36 PM

## 2020-09-27 NOTE — Progress Notes (Signed)
Speech Language Pathology Daily Session Note  Patient Details  Name: [name] MRN: [id number] Date of Birth: Oct 30, 1945  Today's Date: 09/27/2020 SLP Individual Time: 8295-6213 SLP Individual Time Calculation (min): 30 min  Short Term Goals: Week 2: SLP Short Term Goal 1 (Week 2): STG=LTG's (anticipated discharge 5/23)  Skilled Therapeutic Interventions:  Pt was seen for skilled ST targeting patient education prior to discharge.  Per OT, pt's family would not be attending family education today; therefore, SLP provided pt with a handout of recommended memory compensatory strategies for family to maximize carryover in the home environment.  SLP reviewed and reinforced recommendations that pt have assistance for medication management at discharge in addition to provided skilled education regarding memory compensatory strategies.  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  All questions were answered to her satisfaction at this time.  Pt was left in bed with bed alarm set and call bell within reach.  Continue per current plan of care.    Pain Pain Assessment Pain Scale: 0-10 Pain Score: 0-No pain  Therapy/Group: Individual Therapy  Norma Ignasiak, Melanee Spry 09/27/2020, 12:45 PM

## 2020-09-27 NOTE — Progress Notes (Signed)
Physical Therapy Session Note  Patient Details  Name: [name] MRN: [id number] Date of Birth: 03-Aug-1945  Today's Date: 09/27/2020 PT Individual Time: 1015-1055 PT Individual Time Calculation (min): 40 min   Short Term Goals: Week 2:  PT Short Term Goal 1 (Week 2): = to LTGs based on ELOS  Skilled Therapeutic Interventions/Progress Updates:    Pt received supine in bed, agreeable with encouragement to participate in therapy session. Pt's daughters not present for scheduled family education session. Per OT family not present during her session and she called family to remind them of scheduled family education session. Family declining to be present for education. Supine to sit with min A with max tactile cueing required to initiate movement. Sit to stand with min A to RW throughout session. Stand pivot transfer with RW and min A. Car transfer with min A needed for management of LLE in/out of car. Ascend/descend 4 x 6" stairs laterally with R handrail and min A for balance, increased time and cueing needed to perform safely. Ambulation x 150 ft with RW and CGA, cues for increased LLE clearance with onset of fatigue. Pt declines to remain seated in w/c or in recliner at end of session, requests to return to bed. Sit to supine with min A needed for LLE management. Pt left supine in bed with needs in reach, bed alarm in place.  Therapy Documentation Precautions:  Precautions Precautions: Fall Precaution Comments: L inattention Restrictions Weight Bearing Restrictions: No   Therapy/Group: Individual Therapy   Peter Congo, PT, DPT, CSRS  09/27/2020, 12:24 PM

## 2020-09-28 LAB — CBC WITH DIFFERENTIAL/PLATELET
Abs Immature Granulocytes: 0.01 10*3/uL (ref 0.00–0.07)
Basophils Absolute: 0 10*3/uL (ref 0.0–0.1)
Basophils Relative: 1 %
Eosinophils Absolute: 0.2 10*3/uL (ref 0.0–0.5)
Eosinophils Relative: 4 %
HCT: 33.9 % — ABNORMAL LOW (ref 36.0–46.0)
Hemoglobin: 11.1 g/dL — ABNORMAL LOW (ref 12.0–15.0)
Immature Granulocytes: 0 %
Lymphocytes Relative: 39 %
Lymphs Abs: 1.9 10*3/uL (ref 0.7–4.0)
MCH: 29.3 pg (ref 26.0–34.0)
MCHC: 32.7 g/dL (ref 30.0–36.0)
MCV: 89.4 fL (ref 80.0–100.0)
Monocytes Absolute: 0.5 10*3/uL (ref 0.1–1.0)
Monocytes Relative: 9 %
Neutro Abs: 2.3 10*3/uL (ref 1.7–7.7)
Neutrophils Relative %: 47 %
Platelets: 242 10*3/uL (ref 150–400)
RBC: 3.79 MIL/uL — ABNORMAL LOW (ref 3.87–5.11)
RDW: 12.3 % (ref 11.5–15.5)
WBC: 5 10*3/uL (ref 4.0–10.5)
nRBC: 0 % (ref 0.0–0.2)

## 2020-09-28 LAB — BASIC METABOLIC PANEL
Anion gap: 8 (ref 5–15)
BUN: 23 mg/dL (ref 8–23)
CO2: 25 mmol/L (ref 22–32)
Calcium: 9.3 mg/dL (ref 8.9–10.3)
Chloride: 103 mmol/L (ref 98–111)
Creatinine, Ser: 0.69 mg/dL (ref 0.44–1.00)
GFR, Estimated: >60 mL/min (ref >60)
Glucose, Bld: 87 mg/dL (ref 70–99)
Potassium: 3.1 mmol/L — ABNORMAL LOW (ref 3.5–5.1)
Sodium: 136 mmol/L (ref 135–145)

## 2020-09-28 MED ORDER — MODAFINIL 100 MG PO TABS: 100.0000 mg | ORAL_TABLET | Freq: Every day | ORAL | 0 refills | Status: DC

## 2020-09-28 NOTE — Progress Notes (Signed)
Occupational Therapy Discharge Summary  Patient Details  Name: [name] MRN: [id number] Date of Birth: 10/28/1945  Today's Date: 09/28/2020 OT Individual Time:  -       Patient has met 9 of 12 long term goals due to improved activity tolerance, improved balance, ability to compensate for deficits, functional use of  LEFT upper extremity, improved attention, improved awareness and improved coordination.  Patient to discharge at [facility] Assist level.  Patient's care partner unavailable to provide the necessary physical and cognitive assistance at discharge.  No family attended education which was scheduled for Sunday 5/23.    Reasons goals not met: Pt continues to demonstrate cognitive impairment with regards to awareness, memory, and attention.  She needs overall mod assist to recall techniques from day to day including initiation of tasks as well as attention to the left UE and left visual field.  Recommendation:  Patient will benefit from ongoing skilled OT services in home health setting to continue to advance functional skills in the area of BADL and Reduce care partner burden.  Feel pt will benefit from ongoing [place] to further progress ADL function to a supervision/modified independent level for home.  Currently pt exhibits deficits with awareness, initiation, cognitive processing, safety awareness, as well as vision and LUE and LLE strength and functional use.  No family attended education sessions scheduled and pt will need 24 hr physical assist at discharge, which has been expressed to them.    Equipment: No equipment provided  Reasons for discharge: treatment goals met and discharge from hospital  Patient/family agrees with progress made and goals achieved: No  OT Discharge Precautions/Restrictions  Precautions Precautions: Fall Precaution Comments: L inattention Restrictions Weight Bearing Restrictions: No  ADL ADL Eating: Set up Where Assessed-Eating:  Wheelchair Grooming: Supervision/safety Where Assessed-Grooming: Standing at sink Upper Body Bathing: Supervision/safety Where Assessed-Upper Body Bathing: Chair,Shower Lower Body Bathing: Contact guard Where Assessed-Lower Body Bathing: Chair,Shower Upper Body Dressing: Supervision/safety Where Assessed-Upper Body Dressing: Wheelchair Lower Body Dressing: Minimal assistance Where Assessed-Lower Body Dressing: Wheelchair,Standing at sink,Sitting at sink Toileting: Contact guard Where Assessed-Toileting: Glass blower/designer: Therapist, music Method: Counselling psychologist: Grab bars,Raised toilet seat,Bedside commode Tub/Shower Transfer: Metallurgist Method: Transfer board,Stand Adult nurse: Facilities manager: Curator Method: Heritage manager: Civil engineer, contracting with back Vision Baseline Vision/History: Wears glasses Wears Glasses: Reading only Patient Visual Report: Peripheral vision impairment Vision Assessment?: Yes Eye Alignment: Within Functional Limits Ocular Range of Motion: Within Functional Limits Alignment/Gaze Preference: Within Defined Limits Tracking/Visual Pursuits: Decreased smoothness of horizontal tracking;Decreased smoothness of vertical tracking Convergence: Within functional limits Visual Fields: Left visual field deficit Perception  Perception: Impaired Inattention/Neglect: Does not attend to left visual field Praxis Praxis: Impaired Praxis Impairment Details: Initiation Cognition Overall Cognitive Status: Impaired/Different from baseline Arousal/Alertness: Awake/alert Attention: Sustained;Selective Sustained Attention: Impaired Sustained Attention Impairment: Verbal basic;Functional basic Selective Attention: Impaired Selective Attention Impairment: Verbal basic;Functional basic Memory: Impaired Memory Impairment:  Retrieval deficit;Storage deficit;Decreased recall of new information Awareness: Impaired Awareness Impairment: Emergent impairment;Anticipatory impairment Problem Solving: Impaired Problem Solving Impairment: Functional basic Safety/Judgment: Impaired Sensation Sensation Light Touch: Appears Intact Hot/Cold: Appears Intact Proprioception: Appears Intact Stereognosis: Appears Intact Additional Comments: Sensation intact in BUEs with gross testing. Coordination Gross Motor Movements are Fluid and Coordinated: No Fine Motor Movements are Fluid and Coordinated: No Coordination and Movement Description: left hemiparesis still present, but pt able to use the UE at a diminshed level  for selfcare tasks Motor  Motor Motor: Hemiplegia;Abnormal postural alignment and control Motor - Discharge Observations: mild L hemi with L inattention Mobility  Transfers Sit to Stand: Contact Guard/Touching assist Stand to Sit: Contact Guard/Touching assist  Trunk/Postural Assessment  Cervical Assessment Cervical Assessment: Exceptions to [facility] (cervical protaction) Thoracic Assessment Thoracic Assessment: Exceptions to [facility] (thoracic rounding in sitting) Lumbar Assessment Lumbar Assessment: Exceptions to [facility] (posterior pelvic tilt)  Balance Balance Balance Assessed: Yes Static Sitting Balance Static Sitting - Balance Support: Feet supported Static Sitting - Level of Assistance: 5: Stand by assistance Dynamic Sitting Balance Dynamic Sitting - Balance Support: During functional activity;Feet supported Dynamic Sitting - Level of Assistance: 5: Stand by assistance Static Standing Balance Static Standing - Balance Support: Bilateral upper extremity supported;During functional activity Static Standing - Level of Assistance:  (contact guard) Dynamic Standing Balance Dynamic Standing - Balance Support: Bilateral upper extremity supported;During functional activity Dynamic Standing - Level of Assistance:  4: Min assist Extremity/Trunk Assessment RUE Assessment RUE Assessment: Within Functional Limits Active Range of Motion (AROM) Comments: WFLs grossly for all movements General Strength Comments: 3+/5 shoulder flexion with 4/5 for all other joints LUE Assessment LUE Assessment: Exceptions to [facility] Passive Range of Motion (PROM) Comments: WFLs Active Range of Motion (AROM) Comments: WFLs General Strength Comments: shoulder flexion 3+/5 all other joints 4/5.  Slight decreased FM coordination compared to the right   Carrin Vannostrand OTR/L 09/28/2020, 5:24 PM

## 2020-09-28 NOTE — Progress Notes (Signed)
PROGRESS NOTE   Subjective/Complaints:  No issues overnite   ROS: Limited due to cognition/participation  Objective:   No results found. No results for input(s): WBC, HGB, HCT, PLT in the last 72 hours. No results for input(s): NA, K, CL, CO2, GLUCOSE, BUN, CREATININE, CALCIUM in the last 72 hours.  Intake/Output Summary (Last 24 hours) at 09/28/2020 0750 Last data filed at 09/27/2020 1814 Gross per 24 hour  Intake 377 ml  Output --  Net 377 ml        Physical Exam: Vital Signs Blood pressure 138/60, pulse (!) 50, temperature 98.7 F (37.1 C), resp. rate 16, height 5\' 10"  (1.778 m), weight 87.7 kg, SpO2 100 %.  General: No acute distress Mood and affect are appropriate Heart: Regular rate and rhythm no rubs murmurs or extra sounds Lungs: Clear to auscultation, breathing unlabored, no rales or wheezes Abdomen: Positive bowel sounds, soft nontender to palpation, nondistended Extremities: No clubbing, cyanosis, or edema Skin: No evidence of breakdown, no evidence of rash   Psych: Flat.  Disengaged. Musc: No edema in extremities.  No tenderness in extremities. Neuro: Alert RUE/RLE: 5/5 proximal distal LUE 4/5 /LLE:  3 -/5 proximal distal- ? Effort   Assessment/Plan: 1. Functional deficits which require 3+ hours per day of interdisciplinary therapy in a comprehensive inpatient rehab setting.  Physiatrist is providing close team supervision and 24 hour management of active medical problems listed below.  Physiatrist and rehab team continue to assess barriers to discharge/monitor patient progress toward functional and medical goals  Care Tool:  Bathing    Body parts bathed by patient: Right arm,Left arm,Chest,Abdomen,Front perineal area,Buttocks,Right upper leg,Left upper leg,Face,Right lower leg,Left lower leg   Body parts bathed by helper: Right lower leg,Left lower leg     Bathing assist Assist Level:  Minimal Assistance - Patient > 75%     Upper Body Dressing/Undressing Upper body dressing   What is the patient wearing?: Pull over shirt    Upper body assist Assist Level: Minimal Assistance - Patient > 75%    Lower Body Dressing/Undressing Lower body dressing      What is the patient wearing?: Pants,Incontinence brief     Lower body assist Assist for lower body dressing: Moderate Assistance - Patient 50 - 74%     Toileting Toileting    Toileting assist Assist for toileting: Minimal Assistance - Patient > 75%     Transfers Chair/bed transfer  Transfers assist     Chair/bed transfer assist level: Minimal Assistance - Patient > 75% Chair/bed transfer assistive device: assist      Assist level: Contact Guard/Touching assist Assistive device: Walker-rolling Max distance: 143ft   Walk 10 feet activity   Assist     Assist level: Contact Guard/Touching assist Assistive device: Walker-rolling   Walk 50 feet activity   Assist    Assist level: Contact Guard/Touching assist Assistive device: Walker-rolling    Walk 150 feet activity   Assist Walk 150 feet activity did not occur: Safety/medical concerns  Assist level: Contact Guard/Touching assist Assistive device: Walker-rolling    Walk 10 feet on uneven surface  activity   Assist  Walk 10 feet on uneven surfaces activity did not occur: Safety/medical concerns         Wheelchair     Assist Will patient use wheelchair at discharge?: No Type of Wheelchair: Manual    Wheelchair assist level: Minimal Assistance - Patient > 75% Max wheelchair distance: 25    Wheelchair 50 feet with 2 turns activity    Assist            Wheelchair 150 feet activity     Assist          Blood pressure 138/60, pulse (!) 50, temperature 98.7 F (37.1 C), resp. rate 16, height 5\' 10"  (1.778 m), weight 87.7 kg, SpO2 100 %.    Medical Problem  List and Plan: 1.  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  Status post right ICA revascularization  Continue CIR  2.  Antithrombotics: -DVT/anticoagulation: SCDs             -antiplatelet therapy: Aspirin 81 mg daily, Brilinta 45 mg  Daily-reduced dose  due to small SAH 3. Pain Management: Tylenol as needed  Appears controlled on 5/23 4. Mood: Provide emotional support             -antipsychotic agents: N/A 5. 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 Skin/Wound Care: Routine skin checks 7. Fluids/Electrolytes/Nutrition: Routine in and outs.    8.  Hypertension.  Norvasc 2.5 mg daily.- increased to 5mg  starting 5/12 Vitals:   09/27/20 1920 09/28/20 0519  BP: 135/67 138/60  Pulse: 65 (!) 50  Resp: 16 16  Temp: 98.4 F (36.9 C) 98.7 F (37.1 C)  SpO2: 99% 100%  controlled 5/23- apical pulse is 70 this am              Monitor with increased mobility 9.    Hyperlipidemia: Lipitor 10.  History of COPD with remote tobacco use.               O2 Sats every shift             Questionable medical noncompliance.  Provide counseling 11.  Post CVA HA:off topiramatie no increased c/os use prn tylenol due to concerns about sedation   12.  Right shoulder pain  Improved  13.  Acute blood loss anemia  Hemoglobin 10.8 on 5/12, labs ordered for today   LOS: 12 days A FACE TO FACE EVALUATION WAS PERFORMED  6/23 09/28/2020, 7:50 AM

## 2020-09-28 NOTE — Progress Notes (Signed)
Pt d/c to home with personal property, discharge instructions provided to pt by D. Anguilli, PA-c. Pt verbalized an understanding of medications, discharge instructions, and follow up appts.

## 2020-09-28 NOTE — Discharge Instructions (Signed)
Inpatient Rehab Discharge Instructions  Austen Wygant Discharge date and time: No discharge date for patient encounter.   Activities/Precautions/ Functional Status: Activity: activity as tolerated Diet: regular diet Wound Care: Routine skin checks Functional status:  ___ No restrictions     ___ Walk up steps independently ___ 24/7 supervision/assistance   ___ Walk up steps with assistance ___ Intermittent supervision/assistance  ___ Bathe/dress independently ___ Walk with walker     _x__ Bathe/dress with assistance ___ Walk Independently    ___ Shower independently ___ Walk with assistance    ___ Shower with assistance ___ No alcohol     ___ Return to work/school ________   COMMUNITY REFERRALS UPON DISCHARGE:    Home Health:   PT     OT                  Agency: [facility] [facility]  [phone] *Please expect follow-up within 2-3 days to schedule home visit. If you have not received follow-up, be sure to contact the branch directly.*   Medical Equipment/Items Ordered: rolling walker                                                 Agency/Supplier: [facility] [phone]  Special Instructions: New patient appointment scheduled on October 20, 2020 at 10:30 AM with Hetty Blend, NP-C at [facility]   No driving smoking or alcohol  Continue aspirin 81 mg daily and Brilinta 60 mg twice daily x3 months total then aspirin alone STROKE/TIA DISCHARGE INSTRUCTIONS SMOKING Cigarette smoking nearly doubles your risk of having a stroke & is the single most alterable risk factor  If you smoke or have smoked in the last 12 months, you are advised to quit smoking for your health.  Most of the excess cardiovascular risk related to smoking disappears within a year of stopping.  Ask you doctor about anti-smoking medications  [place] Quit Line: 1-800-QUIT NOW  Free Smoking Cessation Classes [phone]  CHOLESTEROL Know your levels; limit fat & cholesterol in  your diet  Lipid Panel     Component Value Date/Time   CHOL 230 (H) 09/12/2020 0025   TRIG 83 09/12/2020 0025   HDL 53 09/12/2020 0025   CHOLHDL 4.3 09/12/2020 0025   VLDL 17 09/12/2020 0025   LDLCALC 160 (H) 09/12/2020 0025      Many patients benefit from treatment even if their cholesterol is at goal.  Goal: Total Cholesterol (CHOL) less than 160  Goal:  Triglycerides (TRIG) less than 150  Goal:  HDL greater than 40  Goal:  LDL (LDLCALC) less than 100   BLOOD PRESSURE American Stroke Association blood pressure target is less that 120/80 mm/Hg  Your discharge blood pressure is:  BP: (!) 188/77  Monitor your blood pressure  Limit your salt and alcohol intake  Many individuals will require more than one medication for high blood pressure  DIABETES (A1c is a blood sugar average for last 3 months) Goal HGBA1c is under 7% (HBGA1c is blood sugar average for last 3 months)  Diabetes: No known diagnosis of diabetes    Lab Results  Component Value Date   HGBA1C 5.3 09/11/2020     Your HGBA1c can be lowered with medications, healthy diet, and exercise.  Check your blood sugar as directed by your physician  Call your physician if you experience  unexplained or low blood sugars.  PHYSICAL ACTIVITY/REHABILITATION Goal is 30 minutes at least 4 days per week  Activity: Increase activity slowly, Therapies: Physical Therapy: Home Health Return to work:   Activity decreases your risk of heart attack and stroke and makes your heart stronger.  It helps control your weight and blood pressure; helps you relax and can improve your mood.  Participate in a regular exercise program.  Talk with your doctor about the best form of exercise for you (dancing, walking, swimming, cycling).  DIET/WEIGHT Goal is to maintain a healthy weight  Your discharge diet is:  Diet Order            Diet regular Room service appropriate? Yes with Assist; Fluid consistency: Thin  Diet effective now                  liquids Your height is:  Height: 5\' 10"  (177.8 cm) Your current weight is: Weight: 87.7 kg Your Body Mass Index (BMI) is:  BMI (Calculated): 27.74  Following the type of diet specifically designed for you will help prevent another stroke.  Your goal weight range is:    Your goal Body Mass Index (BMI) is 19-24.  Healthy food habits can help reduce 3 risk factors for stroke:  High cholesterol, hypertension, and excess weight.  RESOURCES Stroke/Support Group:  Call [phone]   STROKE EDUCATION PROVIDED/REVIEWED AND GIVEN TO PATIENT Stroke warning signs and symptoms How to activate emergency medical system (call 911). Medications prescribed at discharge. Need for follow-up after discharge. Personal risk factors for stroke. Pneumonia vaccine given:  Flu vaccine given:  My questions have been answered, the writing is legible, and I understand these instructions.  I will adhere to these goals & educational materials that have been provided to me after my discharge from the hospital.     My questions have been answered and I understand these instructions. I will adhere to these goals and the provided educational materials after my discharge from the hospital.  Patient/Caregiver Signature _______________________________ Date __________  Clinician Signature _______________________________________ Date __________  Please bring this form and your medication list with you to all your follow-up doctor's appointments.

## 2020-09-28 NOTE — Progress Notes (Signed)
Patient ID: [name], female   DOB: 11-07-1945, 75 y.o.   MRN: [id number]  This SW covering for primary SW, Erlene Quan.   SW called [name] [phone]) to follow-up about HHA preference. Reports she did not get message on Friday from [place] as she has been having issues with her phone where she is not getting messages or phone calls. SW discussed HHAs in their area. Preferred HHA is [place]. Reports she will be here around 2:30pm/3pm due to trying to get things set up for pt and having to get a bed for her.   SW sent referral to [facility] for HHPT/OT and waiting on follow-up.   SW called [name] to inform on above about HHA, and contact info for agency will be in AVS. She reported that pt was calling her granddaughter asking to leave now. Asked SW if she could relay message informing her that she will not be here until 3pm since she also has her grandchildren and not able to find any siblings that can come get her. SW met with pt in room to inform on above. Pt verbalized understanding. SW informed pt assigned Therapist, sports.   SW faxed [place] order to [facility] [phone].  Loralee Pacas, MSW, [place] Office: [phone] Cell: [phone] Fax: [phone]

## 2020-09-29 ENCOUNTER — Telehealth: Payer: Self-pay

## 2020-09-29 NOTE — Telephone Encounter (Signed)
Hospital discharge notes reviewed per protocol. In-home physical therapy verbal approval given to [facility] PT with [facility]. Services to be twice a week for 8 weeks.   Call back phone [phone].

## 2020-09-29 NOTE — Progress Notes (Signed)
Inpatient Rehabilitation Care Coordinator Discharge Note  The overall goal for the admission was met for:   Discharge location: Yes  Length of Stay: Yes  Discharge activity level: Yes  Home/community participation: Yes  Services provided included: MD, RD, PT, OT, SLP, RN, CM, TR, Pharmacy, Neuropsych and SW  Financial Services: Medicare  Choices offered to/list presented to:pt and dtr  Follow-up services arranged: [facility] Health: [facility]  Comments (or additional information):  Patient/Family verbalized understanding of follow-up arrangements: Yes  Individual responsible for coordination of the follow-up planYork Cerise, [phone]  Confirmed correct DME delivered: Dyanne Iha 09/29/2020    Dyanne Iha

## 2020-09-30 ENCOUNTER — Telehealth: Payer: Self-pay | Admitting: *Deleted

## 2020-09-30 ENCOUNTER — Telehealth: Payer: Self-pay

## 2020-09-30 NOTE — Telephone Encounter (Signed)
Transition Care Management Unsuccessful Follow-up Telephone Call  Date of discharge and from where:  [facility] inpt rehab 09/28/20  Attempts:  2nd Attempt  Reason for unsuccessful TCM follow-up call:  Left voice message to expect papers in mail and appt date 10/08/20 9:20 arrive by 9:00

## 2020-09-30 NOTE — Telephone Encounter (Signed)
Transition Care Management Unsuccessful Follow-up Telephone Call  Date of discharge and from where:  [facility] inpt rehab  Attempts:  1st Attempt  Reason for unsuccessful TCM follow-up call:  Left voice message

## 2020-09-30 NOTE — Telephone Encounter (Signed)
Brandon, OT from [facility] called requesting verbal orders for HHOT 2wk4. Orders approved and given. 

## 2020-09-30 NOTE — Progress Notes (Signed)
Speech Language Pathology Discharge Summary  Patient Details  Name: [name] MRN: [id number] Date of Birth: [birth year]   Patient has met 3 of 3 long term goals.  Patient to discharge at overall Mod;Min level.  Reasons goals not met: N/A   Clinical Impression/Discharge Summary: Patient achieved all 3 LTG's however this was only when they were downgraded to [facility] for problem solving, reasoning, min-modA for recall. Patient's participation overall was limited and this appeared related to her poor awareness to her deficits. Speech-language abilities were [facility] and she was not seen for any swallow/dysphagia concerns. She will require min-modA level for safety, awareness, problem solving upon discharge.  Care Partner:  Caregiver Able to Provide Assistance: Yes  Type of Caregiver Assistance: Cognitive;Physical  Recommendation:  Home Health SLP;24 hour supervision/assistance  Rationale for SLP Follow Up: Maximize cognitive function and independence;Reduce caregiver burden   Equipment: N/A for speech   Reasons for discharge: Discharged from hospital   Patient/Family Agrees with Progress Made and Goals Achieved: Yes   Sonia Baller, MA, CCC-SLP Speech Therapy

## 2020-10-08 ENCOUNTER — Encounter: Payer: Medicare Other | Admitting: Registered Nurse

## 2020-10-12 ENCOUNTER — Encounter: Payer: Self-pay | Admitting: Registered Nurse

## 2020-10-12 ENCOUNTER — Encounter: Payer: Medicare Other | Attending: Registered Nurse | Admitting: Registered Nurse

## 2020-10-12 ENCOUNTER — Other Ambulatory Visit: Payer: Self-pay

## 2020-10-12 VITALS — BP 132/81 | HR 79 | Temp 98.0°F | Ht 70.0 in | Wt 186.0 lb

## 2020-10-12 DIAGNOSIS — I10 Essential (primary) hypertension: Secondary | ICD-10-CM | POA: Insufficient documentation

## 2020-10-12 DIAGNOSIS — I6300 Cerebral infarction due to thrombosis of unspecified precerebral artery: Secondary | ICD-10-CM | POA: Diagnosis not present

## 2020-10-12 DIAGNOSIS — M25562 Pain in left knee: Secondary | ICD-10-CM | POA: Insufficient documentation

## 2020-10-12 DIAGNOSIS — I615 Nontraumatic intracerebral hemorrhage, intraventricular: Secondary | ICD-10-CM

## 2020-10-12 DIAGNOSIS — I63519 Cerebral infarction due to unspecified occlusion or stenosis of unspecified middle cerebral artery: Secondary | ICD-10-CM

## 2020-10-12 NOTE — Progress Notes (Signed)
Subjective:    Patient ID: [name], female    DOB: Jan 08, 1946, 75 y.o.   MRN: [id number]  HPI: [name] is a 75 y.o. female who is here for HFU appointment for follow up of her CVA, Essential Hypertension  and Left knee pain.  She presented to [facility] on 09/10/2020, she reported dizziness and unsteady gait for 2 weeks. After her arrival to ED she developed left sided weakness, left facial droop and left side neglect. Code Stroke was activated.   CT Code Stroke WO Contrast:  IMPRESSION: 1. No acute abnormality. 2. Chronic infarct right parietal lobe. Microvascular ischemic change in the white matter 3. ASPECTS is 10 4. These results were called by telephone at the time of interpretation on 09/10/2020 at 4:07 pm to provider Effie Shy , who verbally acknowledged these results.  CT Angio:  IMPRESSION: Age-indeterminate occlusion of the right cervical ICA just beyond the origin. At least partial reconstitution at the clinoid intracranially.   Less than 50% stenosis at the left ICA origin.   Mild to moderate intracranial atherosclerosis.   Results were discussed by telephone at the time of interpretation on 09/10/2020 at 4:34 pm with provider Dr. Iver Nestle, who verbally acknowledged these results.   MR Brain: WO Contrast:  IMPRESSION: Right parietal small to moderate size acute and subacute infarct as well as a few additional punctate acute to subacute right centrum semiovale infarcts. No evidence of hemorrhage.  CT Cerebral Perfusion:  IMPRESSION: 1. 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 2. 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 3. Progressive proximal right M1 segment stenosis with significant loss of perfusion in the right MCA branch vessels. 4. Stable atherosclerotic changes at the left carotid bifurcation without significant stenosis. 5. 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 Basilar artery is similar the prior exam. 6.  Aortic Atherosclerosis (ICD10-I70.0).   Per Dr Daleen Bo Note:  Pt underwent emergent thrombectomy, since transferred to ICU. Neurology had initially recommended ASA 81mg  with Brilinta 90mg  bid x 3 months followed by ASA alone.  On late night of 09/14/2020 patient was noted to have right hand pain and numbness, worsened.  CT head was done which showed subarachnoid hemorrhage involving the right sylvian fissures with evolving right MCA infarct.  Case was discussed with neurology as well as IR.  Patient's antiplatelets were held.  Repeat CT head was done today on 09/16/2020 which showed stable hemorrhage.  IR checked P2Y12.  Neurology as well as IR recommended resuming aspirin 81 mg p.o. daily and Brilinta 60 mg p.o. twice daily.  [name] was admitted to inpatient rehabilitation on 09/16/2020 and discharged home on 09/28/2020. She is receiving HomeHealth therapy from [facility]. She sates she has left knee pain. She rates her pain 5. Also reports she has a good appetite.   This provider placed a call to [facility] Neurology to obtain HFU appointment, daughter is aware of the appointment and verbalizes understanding.   [name] in the room all questions answered.  She states when she giver her mother the Provigil at night her mother is up all day, this was discussed with Dr 09/30/2020 on 10/13/2020. Call was placed to [facility] on 10/13/2020. She was instructed to Hold Provigil dose on 10/13/2020 at [facility] and for her to give the provigil during the day only, beginmning 10/14/2020, she verbalizes understanding.      Pain Inventory Average  Pain 5 Pain Right Now 5 My pain is aching  LOCATION OF PAIN  groin  BOWEL Number of stools per week: 3 Oral laxative use No  Type of laxative n/a Enema or suppository use No  History of colostomy No  Incontinent No   BLADDER Normal In and out cath,  frequency n/a Able to self cath Yes  Bladder incontinence No  Frequent urination No  Leakage with coughing No  Difficulty starting stream No  Incomplete bladder emptying No    Mobility use a walker how many minutes can you walk? 5-6 mins ability to climb steps?  yes do you drive?  no Do you have any goals in this area?  yes  Function not employed: date last employed not sure  Neuro/Psych No problems in this area  Prior Studies n/a  Physicians involved in your care n/a   No family history on file. Social History   Socioeconomic History   Marital status: Single    Spouse name: Not on file   Number of children: Not on file   Years of education: Not on file   Highest education level: Not on file  Occupational History   Not on file  Tobacco Use   Smoking status: Former Smoker    Quit date: 05/10/1987    Years since quitting: 33.4   Smokeless tobacco: Never Used  Vaping Use   Vaping Use: Never used  Substance and Sexual Activity   Alcohol use: Never   Drug use: Never   Sexual activity: Not Currently    Birth control/protection: None  Other Topics Concern   Not on file  Social History Narrative   Not on file   Social Determinants of Health   Financial Resource Strain: Not on file  Food Insecurity: Not on file  Transportation Needs: Not on file  Physical Activity: Not on file  Stress: Not on file  Social Connections: Not on file   Past Surgical History:  Procedure Laterality Date   IR CT HEAD LTD  09/11/2020   IR INTRAVSC STENT CERV CAROTID W/O EMB-PROT MOD SED INC ANGIO  09/11/2020   IR PERCUTANEOUS ART THROMBECTOMY/INFUSION INTRACRANIAL INC DIAG ANGIO  09/11/2020       IR PERCUTANEOUS ART THROMBECTOMY/INFUSION INTRACRANIAL INC DIAG ANGIO  09/11/2020   IR US GUIDE VASC ACCESS RIGHT  09/11/2020   NO PAST SURGERIES     RADIOLOGY WITH ANESTHESIA N/A 09/11/2020   Procedure: IR WITH ANESTHESIA;  Surgeon: Julieanne Cotton, MD;  Location: [facility] OR;  Service: Radiology;   Laterality: N/A;   Past Medical History:  Diagnosis Date   COPD (chronic obstructive pulmonary disease) (HCC)    History of DVT (deep vein thrombosis)    Hypertension    Thoracoabdominal aortic aneurysm (HCC)    BP 132/81   Pulse 79   Temp 98 F (36.7 C)   Ht 5\' 10"  (1.778 m)   Wt 186 lb (84.4 kg)   SpO2 97%   BMI 26.69 kg/m   Opioid Risk Score:   Fall Risk Score:  `1  Depression screen PHQ 2/9  No flowsheet data found.  Review of Systems  Constitutional: Negative.   HENT: Negative.   Eyes: Negative.   Respiratory: Negative.   Cardiovascular: Negative.   Gastrointestinal: Negative.   Endocrine: Negative.   Genitourinary: Negative.   Musculoskeletal:       Some pain in groin area  Skin: Negative.   Allergic/Immunologic: Negative.   Neurological: Negative.   Hematological: Negative.  Psychiatric/Behavioral: Negative.        Objective:   Physical Exam Vitals and nursing note reviewed.  Constitutional:      Appearance: Normal appearance.  Cardiovascular:     Rate and Rhythm: Normal rate and regular rhythm.     Pulses: Normal pulses.     Heart sounds: Normal heart sounds.  Pulmonary:     Effort: Pulmonary effort is normal.     Breath sounds: Normal breath sounds.  Musculoskeletal:     Cervical back: Normal range of motion and neck supple.     Comments: Normal Muscle Bulk and Muscle Testing Reveals:  Upper Extremities: Full ROM and Muscle Strength  4/5 Lower Extremities : Full ROM and Muscle Strength 5/5 Arises from Table Slowly using walker for support Narrow Based Gait     Skin:    General: Skin is warm and dry.  Neurological:     Mental Status: She is alert and oriented to person, place, and time.  Psychiatric:        Mood and Affect: Mood normal.        Behavior: Behavior normal.          Assessment & Plan:  CVA: Continue Home Health Therapy with [facility], She has a scheduled appointment with Neurology. Per Dr Daleen Bo Note:  Pt  underwent emergent thrombectomy, since transferred to ICU. Neurology had initially recommended ASA 81mg  with Brilinta 90mg  bid x 3 months followed by ASA alone.  On late night of 09/14/2020 patient was noted to have right hand pain and numbness, worsened.  CT head was done which showed subarachnoid hemorrhage involving the right sylvian fissures with evolving right MCA infarct.  Case was discussed with neurology as well as IR.  Patient's antiplatelets were held.  Repeat CT head was done today on 09/16/2020 which showed stable hemorrhage.  IR checked P2Y12.  Neurology as well as IR recommended resuming aspirin 81 mg p.o. daily and Brilinta 60 mg p.o. twice daily. Essential Hypertension: Continue Current Medication regimen: PCP Following. Continue to Monitor.  3. Left knee pain. Continue HEP as Tolerated. Continue to Monitor.   F/U with Dr 11/14/2020 in 4- 6 weeks

## 2020-10-20 ENCOUNTER — Ambulatory Visit: Payer: Self-pay | Admitting: Family Medicine

## 2020-11-20 ENCOUNTER — Other Ambulatory Visit: Payer: Self-pay

## 2020-11-20 ENCOUNTER — Encounter: Payer: Medicare Other | Attending: Registered Nurse | Admitting: Physical Medicine & Rehabilitation

## 2020-11-20 ENCOUNTER — Encounter: Payer: Self-pay | Admitting: Physical Medicine & Rehabilitation

## 2020-11-20 VITALS — BP 137/85 | HR 64 | Temp 99.4°F | Ht 70.0 in | Wt 187.2 lb

## 2020-11-20 DIAGNOSIS — I10 Essential (primary) hypertension: Secondary | ICD-10-CM | POA: Diagnosis present

## 2020-11-20 DIAGNOSIS — I69354 Hemiplegia and hemiparesis following cerebral infarction affecting left non-dominant side: Secondary | ICD-10-CM | POA: Insufficient documentation

## 2020-11-20 DIAGNOSIS — I6300 Cerebral infarction due to thrombosis of unspecified precerebral artery: Secondary | ICD-10-CM | POA: Diagnosis not present

## 2020-11-20 MED ORDER — ATORVASTATIN CALCIUM 80 MG PO TABS: 80.0000 mg | ORAL_TABLET | Freq: Every day | ORAL | 0 refills | Status: DC

## 2020-11-20 NOTE — Progress Notes (Signed)
Subjective:    Patient ID: [name], female    DOB: 1945-09-11, 75 y.o.   MRN: [id number]  HPI  The patient is a 75 year old female who is on the inpatient rehab service in May 2022.  Her stroke was 09/10/2020  "Right parietal small to moderate size acute and subacute infarct as well as a few additional punctate acute to subacute right centrum semiovale infarcts. No evidence of hemorrhage." Started on aspirin and Brilinta.  The patient has continued these medications.  Unfortunately she has not followed up with primary care yet.  She is waiting on a call back apparently.  We talked to the patient as well as her daughter about this.  She did not have a PCP prior to admission. Still getting HH PT, OT The patient is requiring assistance for cooking and cleaning but otherwise is modified independent for ADLs and mobility using a rolling walker. Prior to her stroke she did not use any type of an assistive device  The patient sees neurology on Monday pain Inventory Average Pain 7 Pain Right Now 0 My pain is intermittent, dull, and aching  LOCATION OF PAIN  Right groin, left knee  BOWEL Number of stools per week: 2  Oral laxative use Yes  Type of laxative Exlax Enema or suppository use No  History of colostomy No  Incontinent No   BLADDER Normal In and out cath, frequency n/a Able to self cath No  Bladder incontinence No  Frequent urination No  Leakage with coughing No  Difficulty starting stream No  Incomplete bladder emptying No    Mobility walk without assistance walk with assistance use a walker ability to climb steps?  yes do you drive?  no Do you have any goals in this area?  yes  Function retired I need assistance with the following:  bathing, meal prep, household duties, shopping, and family helps out. Do you have any goals in this area?  yes  Neuro/Psych No problems in this area trouble walking  Prior Studies Any changes since last visit?   no  Physicians involved in your care Any changes since last visit?  no   No family history on file. Social History   Socioeconomic History   Marital status: Single    Spouse name: Not on file   Number of children: Not on file   Years of education: Not on file   Highest education level: Not on file  Occupational History   Not on file  Tobacco Use   Smoking status: Former    Types: Cigarettes    Quit date: 05/10/1987    Years since quitting: 33.5   Smokeless tobacco: Never  Vaping Use   Vaping Use: Never used  Substance and Sexual Activity   Alcohol use: Never   Drug use: Never   Sexual activity: Not Currently    Birth control/protection: None  Other Topics Concern   Not on file  Social History Narrative   Not on file   Social Determinants of Health   Financial Resource Strain: Not on file  Food Insecurity: Not on file  Transportation Needs: Not on file  Physical Activity: Not on file  Stress: Not on file  Social Connections: Not on file   Past Surgical History:  Procedure Laterality Date   IR CT HEAD LTD  09/11/2020   IR INTRAVSC STENT CERV CAROTID W/O EMB-PROT MOD SED INC ANGIO  09/11/2020   IR PERCUTANEOUS ART THROMBECTOMY/INFUSION INTRACRANIAL INC DIAG ANGIO  09/11/2020  IR PERCUTANEOUS ART THROMBECTOMY/INFUSION INTRACRANIAL INC DIAG ANGIO  09/11/2020   IR US GUIDE VASC ACCESS RIGHT  09/11/2020   NO PAST SURGERIES     RADIOLOGY WITH ANESTHESIA N/A 09/11/2020   Procedure: IR WITH ANESTHESIA;  Surgeon: Julieanne Cotton, MD;  Location: [facility] OR;  Service: Radiology;  Laterality: N/A;   Past Medical History:  Diagnosis Date   COPD (chronic obstructive pulmonary disease) (HCC)    History of DVT (deep vein thrombosis)    Hypertension    Thoracoabdominal aortic aneurysm (HCC)    There were no vitals taken for this visit.  Opioid Risk Score:   Fall Risk Score:  `1  Depression screen PHQ 2/9  Depression screen PHQ 2/9 10/12/2020  Decreased Interest 0  Down,  Depressed, Hopeless 0  PHQ - 2 Score 0  Altered sleeping 0  Tired, decreased energy 0  Change in appetite 0  Feeling bad or failure about yourself  0  Trouble concentrating 0  Moving slowly or fidgety/restless 0  Suicidal thoughts 0  PHQ-9 Score 0  Difficult doing work/chores Not difficult at all     Review of Systems  Gastrointestinal:        Left groin pain  Musculoskeletal:  Positive for gait problem.       Left knee pain  All other systems reviewed and are negative.     Objective:   Physical Exam Vitals and nursing note reviewed.  Constitutional:      Appearance: Normal appearance.  HENT:     Head: Normocephalic and atraumatic.  Eyes:     Extraocular Movements: Extraocular movements intact.     Conjunctiva/sclera: Conjunctivae normal.     Pupils: Pupils are equal, round, and reactive to light.  Cardiovascular:     Rate and Rhythm: Normal rate and regular rhythm.     Heart sounds: Normal heart sounds.  Pulmonary:     Effort: Pulmonary effort is normal. No respiratory distress.     Breath sounds: Normal breath sounds.  Abdominal:     General: Abdomen is flat. Bowel sounds are normal. There is no distension.     Palpations: Abdomen is soft.  Musculoskeletal:        General: No swelling or tenderness.     Cervical back: Normal range of motion.     Right lower leg: No edema.     Left lower leg: No edema.  Skin:    General: Skin is warm and dry.  Neurological:     Mental Status: She is alert and oriented to person, place, and time.  Psychiatric:        Mood and Affect: Mood normal.        Behavior: Behavior normal.  Motor strength is 4/5 in the left deltoid bicep tricep grip hip flexor knee extensor ankle dorsiflexor 5/5 in the right deltoid bicep tricep grip hip flexor knee extensor ankle dorsiflexor Patient states that bilateral upper lower limb light touch sensation is normal Tone is normal bilaterally Ambulates with rolling walker forward flexed gait no  evidence of toe drag or knee instability.        Assessment & Plan:  #1.  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  She is back to modified independent level with self-care and mobility except for more complex tasks such as cooking and cleaning.  I do not think she will get back to that. She did not use an assistive device prior to her stroke.  She does have the  potential to progress to using a cane. The patient and family are not able to provide transportation to get to and from outpatient therapy so the patient will need to finish out home health and follow-up with physical medicine rehab in 3 months. Recommend follow-up with neurology has appointment on 7/18 with Ihor Austin Patient is still trying to establish with PCP. It is concerning that she has not taken her atorvastatin since June.  I will write a prescription for this and they could pick it up at their pharmacy. Refills will need to be through her new PCP The patient was on Norvasc 5 mg upon discharge from the hospital however she is running a fairly normal blood pressure off blood pressure medications, 137/85 today. Will not restart will defer to primary care Secondary stroke prevention as per neurology.

## 2020-11-20 NOTE — Patient Instructions (Signed)
Once Home health therapy is done please continue home exercises

## 2020-11-23 ENCOUNTER — Encounter: Payer: Self-pay | Admitting: Adult Health

## 2020-11-23 ENCOUNTER — Other Ambulatory Visit: Payer: Self-pay

## 2020-11-23 ENCOUNTER — Ambulatory Visit (INDEPENDENT_AMBULATORY_CARE_PROVIDER_SITE_OTHER): Payer: Medicare Other | Admitting: Adult Health

## 2020-11-23 VITALS — BP 138/81 | HR 64 | Ht 70.0 in | Wt 188.0 lb

## 2020-11-23 DIAGNOSIS — I10 Essential (primary) hypertension: Secondary | ICD-10-CM

## 2020-11-23 DIAGNOSIS — E785 Hyperlipidemia, unspecified: Secondary | ICD-10-CM

## 2020-11-23 DIAGNOSIS — I63511 Cerebral infarction due to unspecified occlusion or stenosis of right middle cerebral artery: Secondary | ICD-10-CM | POA: Diagnosis not present

## 2020-11-23 NOTE — Patient Instructions (Addendum)
Continue aspirin 81 mg daily and Brilinta (ticagrelor) 90 mg bid  and atorvastatin 80mg  daily  for secondary stroke prevention  You will continue brilinta for total of 3 months then just aspirin alone  Continue to follow up with PCP regarding cholesterol and blood pressure management  Maintain strict control of hypertension with blood pressure goal below 130/90 and cholesterol with LDL cholesterol (bad cholesterol) goal below 70 mg/dL.      Followup in the future with me in 3 months or call earlier if needed      Thank you for coming to see at [facility]. I hope we have been able to provide you high quality care today.  You may receive a patient satisfaction survey over the next few weeks. We would appreciate your feedback and comments so that we may continue to improve ourselves and the health of our patients.

## 2020-11-23 NOTE — Progress Notes (Signed)
[facility] [address] [place] [id number] [phone]       HOSPITAL FOLLOW UP NOTE  [name] Date of Birth:  11-02-1945 Medical Record Number:  [id number]   Reason for Referral:  hospital stroke follow up    SUBJECTIVE:   CHIEF COMPLAINT:  Chief Complaint  Patient presents with   Follow-up    RM 2 with [name] Pt is well and stable, states she has no complaints or complications     HPI:   [name] is a 75 y.o. female with history of HTN, COPD, aortic aneurysm, DVT in pregnancy, remote smoking history, non-adherence to medications for the past year, and a remote MI who [facility] for evaluation of feeling dizzy, recent headaches, intermittent left-sided weakness and off balance for 2 - 3 weeks causing her to run into walls and bump her head.  Personally reviewed hospitalization pertinent progress notes, lab work and imaging with summary provided.  She was [facility] further stroke evaluation.  Evaluated by Dr. Pearlean BrownieSethi with stroke work-up revealing right parietal small moderate size acute and subacute infarct as well as a few additional punctate acute to subacute right centrum semiovale infarcts in setting of right carotid artery occlusion, embolic secondary to unknown source.  Due to resolution of symptoms (NIHSS 1), elevated BP and 2 to 3-week onset, the risk of acute intervention outweigh benefits. On 5/6, worsening left-sided weakness and left-sided neglect with NIHSS 14 - CTA confirmed right carotid occlusion but showed progressive proximal right M1 segment stenosis with significant loss of perfusion in the right MCA branch vessels and CT perfusion showing 0 core and 162 cc of surrounding penumbra -etiology felt to be atheroembolic vs large vessel occlusion and underwent emergent diagnostic cerebral angiogram at both right ICA and MCA with complete recanalization.  3 months DAPT recommended with aspirin  and Plavix and f/u with Dr. Salvadore Dome Macedo Rodriguez with carotid ultrasound 3 months post discharge. On 5/10, repeat CT head obtained due to right hand pain/weakness which showed scattered SAH (involving the right sylvian fissure and overlying right frontal cortical sulci) with small volume IVH. Repeat CT head 5/11 showed stable appearance without any increase.  P2 Y 12 level showed slight suppression platelet function and IR recommended resuming aspirin and starting Brilinta for 3 months postprocedure. Per Dr. Pearlean BrownieSethi, recommended outpatient loop recorder to assess for possible atrial fibrillation.  LDL 160 -initiated atorvastatin 80 mg daily.  A1c 5.3.  Other stroke risk factors include advanced age, former tobacco use, CAD and medication noncompliance.  Per therapy recommendations, discharged to [facility] on 09/16/2020 -09/28/2020.  Today, 11/24/2020, [name] is being seen for hospital follow-up accompanied by her daughter, [name].  Reports she has been doing well since discharge with residual left hand weakness and LLE weakness but overall greatly improving.  Currently working with home health physical therapy -using rolling walker for ambulation and denies any recent falls.  She does need some assistance with ADLs such as bathing but otherwise independent.  She is currently living with her daughter who assists as needed -was living independently prior.  Denies new stroke/TIA symptoms.  Compliant on aspirin and Brilinta as well as atorvastatin without associated side effects.  Blood pressure today 138/81.  No further concerns at this time.      ROS:   14 system review of systems performed and negative with exception of those listed in HPI  PMH:  Past Medical History:  Diagnosis Date  COPD (chronic obstructive pulmonary disease) (HCC)    History of DVT (deep vein thrombosis)    Hypertension    Thoracoabdominal aortic aneurysm (HCC)     PSH:  Past Surgical History:  Procedure Laterality Date   IR CT  HEAD LTD  09/11/2020   IR INTRAVSC STENT CERV CAROTID W/O EMB-PROT MOD SED INC ANGIO  09/11/2020   IR PERCUTANEOUS ART THROMBECTOMY/INFUSION INTRACRANIAL INC DIAG ANGIO  09/11/2020       IR PERCUTANEOUS ART THROMBECTOMY/INFUSION INTRACRANIAL INC DIAG ANGIO  09/11/2020   IR US GUIDE VASC ACCESS RIGHT  09/11/2020   NO PAST SURGERIES     RADIOLOGY WITH ANESTHESIA N/A 09/11/2020   Procedure: IR WITH ANESTHESIA;  Surgeon: Julieanne Cotton, MD;  Location: [facility] OR;  Service: Radiology;  Laterality: N/A;    Social History:  Social History   Socioeconomic History   Marital status: Single    Spouse name: Not on file   Number of children: Not on file   Years of education: Not on file   Highest education level: Not on file  Occupational History   Not on file  Tobacco Use   Smoking status: Former    Types: Cigarettes    Quit date: 05/10/1987    Years since quitting: 33.5   Smokeless tobacco: Never  Vaping Use   Vaping Use: Never used  Substance and Sexual Activity   Alcohol use: Not Currently   Drug use: Never   Sexual activity: Not Currently    Birth control/protection: None  Other Topics Concern   Not on file  Social History Narrative   Not on file   Social Determinants of Health   Financial Resource Strain: Not on file  Food Insecurity: Not on file  Transportation Needs: Not on file  Physical Activity: Not on file  Stress: Not on file  Social Connections: Not on file  Intimate Partner Violence: Not on file    Family History: No family history on file.  Medications:   Current Outpatient Medications on File Prior to Visit  Medication Sig Dispense Refill   acetaminophen (TYLENOL) 325 MG tablet Take 2 tablets (650 mg total) by mouth every 4 (four) hours as needed for mild pain (or temp > 37.5 C (99.5 F)).     amLODipine (NORVASC) 5 MG tablet Take 1 tablet (5 mg total) by mouth daily. 30 tablet 0   aspirin EC 81 MG EC tablet Take 1 tablet (81 mg total) by mouth daily. Swallow whole. 30  tablet 0   atorvastatin (LIPITOR) 80 MG tablet Take 1 tablet (80 mg total) by mouth at bedtime. 30 tablet 0   modafinil (PROVIGIL) 100 MG tablet Take 1 tablet (100 mg total) by mouth daily. 30 tablet 0   ticagrelor (BRILINTA) 90 MG TABS tablet Take 0.5 tablets (45 mg total) by mouth daily. 60 tablet 0   No current facility-administered medications on file prior to visit.    Allergies:  No Known Allergies    OBJECTIVE:  Physical Exam  Vitals:   11/23/20 1508  BP: 138/81  Pulse: 64  Weight: 188 lb (85.3 kg)  Height: 5\' 10"  (1.778 m)   Body mass index is 26.98 kg/m. No results found.  Post stroke PHQ 2/9 Depression screen PHQ 2/9 11/20/2020  Decreased Interest 0  Down, Depressed, Hopeless 0  PHQ - 2 Score 0  Altered sleeping -  Tired, decreased energy -  Change in appetite -  Feeling bad or failure about yourself  -  Trouble concentrating -  Moving slowly or fidgety/restless -  Suicidal thoughts -  PHQ-9 Score -  Difficult doing work/chores -     General: well developed, well nourished, pleasant elderly African-American female, seated, in no evident distress Head: head normocephalic and atraumatic.   Neck: supple with no carotid or supraclavicular bruits Cardiovascular: regular rate and rhythm, no murmurs Musculoskeletal: no deformity Skin:  no rash/petichiae Vascular:  Normal pulses all extremities   Neurologic Exam Mental Status: Awake and fully alert.  Fluent speech and language.  Oriented to place and time. Recent and remote memory intact. Attention span, concentration and fund of knowledge appropriate. Mood and affect appropriate.  Cranial Nerves: Fundoscopic exam reveals sharp disc margins. Pupils equal, briskly reactive to light. Extraocular movements full without nystagmus. Visual fields full to confrontation. Hearing intact. Facial sensation intact. Face, tongue, palate moves normally and symmetrically.  Motor: Normal strength, bulk and tone right upper and  lower extremity.  Mild left-sided weakness with decreased grip strength and mild swelling, hip flexor weakness and ankle dorsiflexion weakness Sensory.: intact to touch , pinprick , position and vibratory sensation.  Coordination: Rapid alternating movements normal in all extremities except decreased left hand. Finger-to-nose and heel-to-shin performed accurately bilaterally. Gait and Station: Arises from chair without difficulty. Stance is normal. Gait demonstrates normal stride length and balance with use of rolling walker. Tandem walk and heel toe not attempted due to safety concerns.  Reflexes: 1+ and symmetric. Toes downgoing.     NIHSS  0 Modified Rankin  3      ASSESSMENT: Yetzali Weld is a 76 y.o. year old female with recent right parietal acute and subacute infarct and acute to subacute right centrum semiovale infarcts on 09/10/2020 after presenting with dizziness, headaches, intermittent left-sided weakness and imbalance for 2 to 3 weeks.  Acute worsening left-sided weakness and inattention 5/6 likely in setting of symptomatic right ICA stenosis and underwent mechanical thrombectomy of both right ICA and right MCA occlusion.  Vascular risk factors include HTN, HLD, history of tobacco use, advanced age, CAD and history of medication noncompliance.      PLAN:  R MCA strokes :  Residual deficit: Mild right-sided weakness and gait impairment -continue working with home health therapy for hopeful ongoing recovery.  Advised to follow-up with IR as 3 months postprocedure carotid duplex recommended during hospitalization Referral placed to electrophysiology for further discussion of loop recorder placement to further evaluate for possible A. fib as contributing factor to recent strokes as recommended at hospital discharge Continue aspirin 81 mg daily and Brilinta (ticagrelor) 90 mg bid  and atorvastatin 80 mg daily for secondary stroke prevention.  Continue Brilinta for total of 48-month  duration then aspirin alone. Discussed secondary stroke prevention measures and importance of establishing care with PCP follow up for aggressive stroke risk factor management  HTN: BP goal <130/90.  Stable on amlodipine and advised to continue HLD: LDL goal <70. Recent LDL 160 -continue atorvastatin 80 mg daily. Will plan on repeat levels at follow-up visit if not already completed by that time    Follow up in 3 months or call earlier if needed   CC:  [facility] provider: Dr. Pearlean Brownie PCP: Pcp, No    I spent 56 minutes of face-to-face and non-face-to-face time with patient and daughter.  This included previsit chart review including review of recent hospitalization, lab review, study review, order entry, electronic health record documentation, patient education regarding recent stroke including potential etiologies, secondary stroke prevention and aggressive stroke  risk factor management, residual deficits and possible further recovery, and answered all questions to patient satisfaction   Ihor Austin, AGNP-BC  [facility] [address] [place], [place] [address]  Phone [phone] Fax [phone] Note: This document was prepared with digital dictation and possible smart phrase technology. Any transcriptional errors that result from this process are unintentional.

## 2020-11-24 NOTE — Progress Notes (Signed)
I agree with the above plan 

## 2020-11-26 ENCOUNTER — Telehealth: Payer: Self-pay | Admitting: *Deleted

## 2020-11-26 NOTE — Telephone Encounter (Signed)
  [facility] called for ext of PT POC 1wk4.  Approval given.

## 2020-12-30 ENCOUNTER — Institutional Professional Consult (permissible substitution): Payer: Medicare Other | Admitting: Internal Medicine

## 2021-01-07 ENCOUNTER — Encounter: Payer: Self-pay | Admitting: Cardiology

## 2021-02-19 ENCOUNTER — Other Ambulatory Visit: Payer: Self-pay

## 2021-02-19 ENCOUNTER — Encounter: Payer: Self-pay | Admitting: Physical Medicine & Rehabilitation

## 2021-02-19 ENCOUNTER — Encounter: Payer: Medicare Other | Attending: Registered Nurse | Admitting: Physical Medicine & Rehabilitation

## 2021-02-19 VITALS — BP 200/82 | HR 61 | Temp 98.9°F | Ht 70.0 in | Wt 187.0 lb

## 2021-02-19 DIAGNOSIS — I69354 Hemiplegia and hemiparesis following cerebral infarction affecting left non-dominant side: Secondary | ICD-10-CM

## 2021-02-19 MED ORDER — TICAGRELOR 90 MG PO TABS: 45.0000 mg | ORAL_TABLET | Freq: Every day | ORAL | 0 refills | Status: DC

## 2021-02-19 MED ORDER — ATORVASTATIN CALCIUM 80 MG PO TABS: 80.0000 mg | ORAL_TABLET | Freq: Every day | ORAL | 1 refills | Status: DC

## 2021-02-19 MED ORDER — AMLODIPINE BESYLATE 5 MG PO TABS: 5.0000 mg | ORAL_TABLET | Freq: Every day | ORAL | 1 refills | Status: DC

## 2021-02-19 NOTE — Progress Notes (Signed)
Subjective:    Patient ID: [name], female    DOB: April 20, 1946, 75 y.o.   MRN: [id number] 75 y.o. right-handed female with history of COPD, DVT during pregnancy, hypertension, thoracicoabdominal aortic aneurysm, quit smoking 33 years ago, questionable medical noncompliance.  Per chart review lives with her family.  Independent prior to admission.  Two-level home bed and bath upstairs.  Independent ADLs.  She does not drive.  Presented 09/10/2020 with dizziness and unsteady gait that persisted over 2 to 3 weeks as well as intermittent left-sided weakness.  CT/MRI showed right parietal small to moderate size acute to subacute infarction as well as few additional punctate acute to subacute right centrum semiovale infarctions.  No evidence of hemorrhage.  CT angiogram of head and neck age-indeterminate occlusion of the right cervical ICA just beyond its origin.  At least partial reconstitution at the clinoid intracranially.  Less than 50% stenosis of the left ICA origin.  Admission chemistries unremarkable except potassium 3.3.  Patient underwent right ICA mechanical thrombectomy revascularization per interventional radiology 09/12/2020.  Maintained on DAPT aspirin 81 mg daily and Brilinta for CVA prophylaxis x3 months.  Cleviprex initially added for blood pressure control.  Patient developed increasing right hand pain and numbness follow-up CT of the head 09/15/2020 showed interval development of scattered SAH involving the right sylvian fissure and overlying right frontal cortical sulci.  Small volume intraventricular hemorrhage seen as well as likely related redistribution.  No hydrocephalus or ventricular trapping.  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  She has completed home health PT OT.  She is now modified independent with all self-care and mobility.  She no longer requires an assistive device.  She was using a walker when she was last seen about 3 months ago.  She was referred to primary care 3 months ago but states he still has not seen primary care.  Daughter states that they have an appointment in December.  They state it is a place on the [place] rate but does not remember the name of the doctor. Patient has follow-up with neurology recommendation was for patient to follow-up with interventional radiology but the patient has not made an appointment.  The patient has not been taking any medicines except for aspirin. Pain Inventory Average Pain 4 Pain Right Now 4 My pain is  soreness  In the last 24 hours, has pain interfered with the following? General activity 0 Relation with others 0 Enjoyment of life 0 What TIME of day is your pain at its worst? varies Sleep (in general) Poor  Pain is worse with:  none Pain improves with:  none Relief from Meds: na  History reviewed. No pertinent family history. Social History   Socioeconomic History   Marital status: Single    Spouse name: Not on file   Number of children: Not on file   Years of education: Not on file   Highest education level: Not on file  Occupational History   Not on file  Tobacco Use   Smoking status: Former    Types: Cigarettes    Quit date: 05/10/1987    Years since quitting: 33.8   Smokeless tobacco: Never  Vaping Use   Vaping Use: Never used  Substance and Sexual Activity   Alcohol use:  Not Currently   Drug use: Never   Sexual activity: Not Currently    Birth control/protection: None  Other Topics Concern   Not on file  Social History Narrative   Not on file   Social Determinants of Health   Financial Resource Strain: Not on file  Food Insecurity: Not on file  Transportation Needs: Not on file  Physical Activity:  Not on file  Stress: Not on file  Social Connections: Not on file   Past Surgical History:  Procedure Laterality Date   IR CT HEAD LTD  09/11/2020   IR INTRAVSC STENT CERV CAROTID W/O EMB-PROT MOD SED INC ANGIO  09/11/2020   IR PERCUTANEOUS ART THROMBECTOMY/INFUSION INTRACRANIAL INC DIAG ANGIO  09/11/2020       IR PERCUTANEOUS ART THROMBECTOMY/INFUSION INTRACRANIAL INC DIAG ANGIO  09/11/2020   IR US GUIDE VASC ACCESS RIGHT  09/11/2020   NO PAST SURGERIES     RADIOLOGY WITH ANESTHESIA N/A 09/11/2020   Procedure: IR WITH ANESTHESIA;  Surgeon: Julieanne Cotton, MD;  Location: [facility];  Service: Radiology;  Laterality: N/A;   Past Surgical History:  Procedure Laterality Date   IR CT HEAD LTD  09/11/2020   IR INTRAVSC STENT CERV CAROTID W/O EMB-PROT MOD SED INC ANGIO  09/11/2020   IR PERCUTANEOUS ART THROMBECTOMY/INFUSION INTRACRANIAL INC DIAG ANGIO  09/11/2020       IR PERCUTANEOUS ART THROMBECTOMY/INFUSION INTRACRANIAL INC DIAG ANGIO  09/11/2020   IR US GUIDE VASC ACCESS RIGHT  09/11/2020   NO PAST SURGERIES     RADIOLOGY WITH ANESTHESIA N/A 09/11/2020   Procedure: IR WITH ANESTHESIA;  Surgeon: Julieanne Cotton, MD;  Location: [facility] OR;  Service: Radiology;  Laterality: N/A;   Past Medical History:  Diagnosis Date   COPD (chronic obstructive pulmonary disease) (HCC)    History of DVT (deep vein thrombosis)    Hypertension    Thoracoabdominal aortic aneurysm    BP (!) 200/82   Pulse 61   Temp 98.9 F (37.2 C) (Oral)   Ht 5\' 10"  (1.778 m)   Wt 187 lb (84.8 kg)   SpO2 95%   BMI 26.83 kg/m   Opioid Risk Score:   Fall Risk Score:  `1  Depression screen PHQ 2/9  Depression screen [facility] 2/9 11/20/2020 10/12/2020  Decreased Interest 0 0  Down, Depressed, Hopeless 0 0  PHQ - 2 Score 0 0  Altered sleeping - 0  Tired, decreased energy - 0  Change in appetite - 0  Feeling bad or failure about yourself  - 0  Trouble concentrating - 0  Moving slowly or fidgety/restless - 0  Suicidal thoughts - 0  PHQ-9  Score - 0  Difficult doing work/chores - Not difficult at all     Review of Systems  Musculoskeletal:        Left hand soreness      Objective:   Physical Exam Vitals and nursing note reviewed.  Constitutional:      Appearance: Normal appearance. She is normal weight.  HENT:     Head: Normocephalic and atraumatic.  Eyes:     Extraocular Movements: Extraocular movements intact.     Conjunctiva/sclera: Conjunctivae normal.     Pupils: Pupils are equal, round, and reactive to light.  Cardiovascular:     Rate and Rhythm: Normal rate and regular rhythm.     Heart sounds: Normal heart sounds. No murmur heard. Pulmonary:     Effort: Pulmonary effort is normal. No respiratory distress.  Breath sounds: Normal breath sounds.  Abdominal:     General: Abdomen is flat. Bowel sounds are normal. There is no distension.     Palpations: Abdomen is soft.  Musculoskeletal:     Comments: No pain with upper extremity or lower extremity range of motion No evidence of joint swelling in the upper or lower extremities.  Skin:    General: Skin is warm and dry.  Neurological:     Mental Status: She is alert and oriented to person, place, and time. Mental status is at baseline.     Cranial Nerves: No dysarthria.     Sensory: Sensation is intact.     Coordination: Coordination is intact.     Comments: Motor strength is 5/5 bilateral deltoid, bicep, tricep, grip, hip flexor, knee extensor, knee dorsiflexor plantar flexor Ambulates without assistive device no evidence of toe drag or knee instability.  She does have short step gait as well as wide-based support.  Psychiatric:        Mood and Affect: Mood normal.        Behavior: Behavior normal.          Assessment & Plan:   1.  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  I discussed with the patient main issue now is preventing another stroke.  Referrals to primary care were made upon  discharge from rehabilitation as well as at last MD visit, patient has yet seen primary care provider however has an appointment scheduled.  I have reviewed all the medications that were prescribed at discharge from the [facility] inpatient rehab unit.  They have been sent to her pharmacy.  Strongly recommended that they pick them up and start them today.  Also recommended that patient and daughter purchase a blood pressure cuff. Given excellent functional improvement she does not need any physical medicine rehab follow-up.  She does have neurology follow-up, referral made to interventional radiology as well.

## 2021-02-19 NOTE — Patient Instructions (Signed)
Please get automatic blood pressure cuff from pharmacy to check blood pressure daily

## 2021-03-01 ENCOUNTER — Encounter: Payer: Self-pay | Admitting: Adult Health

## 2021-03-01 ENCOUNTER — Ambulatory Visit: Payer: Medicare Other | Admitting: Adult Health

## 2021-03-04 ENCOUNTER — Ambulatory Visit (HOSPITAL_COMMUNITY)
Admission: RE | Admit: 2021-03-04 | Discharge: 2021-03-04 | Disposition: A | Discharge status: Home / Self Care | Payer: Medicare Other | Source: Ambulatory Visit | Attending: Neuroradiology | Admitting: Neuroradiology

## 2021-03-04 ENCOUNTER — Other Ambulatory Visit: Payer: Self-pay

## 2021-03-04 DIAGNOSIS — G459 Transient cerebral ischemic attack, unspecified: Secondary | ICD-10-CM | POA: Insufficient documentation

## 2021-03-04 NOTE — Progress Notes (Signed)
VASCULAR LAB    Carotid duplex has been performed.  See CV proc for preliminary results.   Upton Russey, RVT 03/04/2021, 1:49 PM

## 2021-03-15 ENCOUNTER — Other Ambulatory Visit (HOSPITAL_COMMUNITY): Payer: Self-pay | Admitting: Neuroradiology

## 2021-03-15 DIAGNOSIS — G459 Transient cerebral ischemic attack, unspecified: Secondary | ICD-10-CM

## 2021-03-22 ENCOUNTER — Ambulatory Visit (HOSPITAL_COMMUNITY)
Admission: RE | Admit: 2021-03-22 | Discharge: 2021-03-22 | Disposition: A | Discharge status: Home / Self Care | Payer: Medicare Other | Source: Ambulatory Visit | Attending: Neuroradiology | Admitting: Neuroradiology

## 2021-03-22 ENCOUNTER — Other Ambulatory Visit: Payer: Self-pay

## 2021-03-22 DIAGNOSIS — G459 Transient cerebral ischemic attack, unspecified: Secondary | ICD-10-CM

## 2021-03-22 NOTE — Progress Notes (Signed)
Referring Physician(s): de Macedo Rodrigues,Shine Scrogham  Chief Complaint: The patient is seen in follow up today s/p right ICA angioplasty and stenting and right MCA mechanical thrombectomy.  History of present illness:  75 year old female with past medical history significant for hypertension, COPD, aortic aneurysm, DVT in pregnancy, former smoking. She was admitted on 09/10/2020 due to history of dizziness and imbalance for 2-3 weeks. 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 Initial NIHSS was 6. However, symptoms resolved during patient evaluation. On 09/11/2020, patient was initially evaluated in the morning and found to have NIHSS of 1 for dysarthria which she refers as her baseline. At 7:25 p.m., patient was found to have new onset of left-sided weakness and neglect, NIHSS 8. Her last known at baseline was 7 p.m. Repeat CT angiogram of the head and neck showed new stenosis of the right M1 segment.  She was then taken for a  diagnostic cerebral angiogram and successful right MCA mechanical thrombectomy and cervical right ICA angioplasty and stenting for treatment of a right ICA occlusion at the bulb with embolus to the right MCA. Complete revascularization of the right MCA was achieved (TICI 3).  During hospitalization, she developed trace subarachnoid hemorrhage thought to be related to low PRU (10) requiring adjusted anti-platelet dosage, now on 45 mg of Brilinta once a day.  She has made a great recovery with mild residual left-sided weakness.  She is able to walk unassisted but requires help with bathing and cooking.  She recently underwent a carotid duplex and comes today to discuss results.   Past Medical History:  Diagnosis Date   COPD (chronic obstructive pulmonary disease) ([place])    History of DVT (deep vein thrombosis)    Hypertension    Thoracoabdominal aortic aneurysm     Past Surgical  History:  Procedure Laterality Date   IR CT HEAD LTD  09/11/2020   IR INTRAVSC STENT CERV CAROTID W/O EMB-PROT MOD SED INC ANGIO  09/11/2020   IR PERCUTANEOUS ART THROMBECTOMY/INFUSION INTRACRANIAL INC DIAG ANGIO  09/11/2020       IR PERCUTANEOUS ART THROMBECTOMY/INFUSION INTRACRANIAL INC DIAG ANGIO  09/11/2020   IR US GUIDE VASC ACCESS RIGHT  09/11/2020   NO PAST SURGERIES     RADIOLOGY WITH ANESTHESIA N/A 09/11/2020   Procedure: IR WITH ANESTHESIA;  Surgeon: Luanne Bras, MD;  Location: [place];  Service: Radiology;  Laterality: N/A;    Allergies: Patient has no known allergies.  Medications: Prior to Admission medications   Medication Sig Start Date End Date Taking? Authorizing Provider  acetaminophen (TYLENOL) 325 MG tablet Take 2 tablets (650 mg total) by mouth every 4 (four) hours as needed for mild pain (or temp > 37.5 C (99.5 F)). Patient not taking: Reported on 02/19/2021 09/27/20   Angiulli, Lavon Paganini, PA-C  amLODipine (NORVASC) 5 MG tablet Take 1 tablet (5 mg total) by mouth daily. 02/19/21   Kirsteins, Luanna Salk, MD  aspirin EC 81 MG EC tablet Take 1 tablet (81 mg total) by mouth daily. Swallow whole. Patient not taking: Reported on 02/19/2021 09/17/20   Darliss Cheney, MD  atorvastatin (LIPITOR) 80 MG tablet Take 1 tablet (80 mg total) by mouth at bedtime. 02/19/21 03/21/21  Kirsteins, Luanna Salk, MD  modafinil (PROVIGIL) 100 MG tablet Take 1 tablet (100 mg total) by mouth daily. Patient not taking: Reported on 02/19/2021 09/28/20   Cathlyn Parsons, PA-C  ticagrelor Kary Kos)  90 MG TABS tablet Take 0.5 tablets (45 mg total) by mouth daily. 02/19/21   Kirsteins, Luanna Salk, MD     No family history on file.  Social History   Socioeconomic History   Marital status: Single    Spouse name: Not on file   Number of children: Not on file   Years of education: Not on file   Highest education level: Not on file  Occupational History   Not on file  Tobacco Use   Smoking status: Former     Types: Cigarettes    Quit date: 05/10/1987    Years since quitting: 33.8   Smokeless tobacco: Never  Vaping Use   Vaping Use: Never used  Substance and Sexual Activity   Alcohol use: Not Currently   Drug use: Never   Sexual activity: Not Currently    Birth control/protection: None  Other Topics Concern   Not on file  Social History Narrative   Not on file   Social Determinants of Health   Financial Resource Strain: Not on file  Food Insecurity: Not on file  Transportation Needs: Not on file  Physical Activity: Not on file  Stress: Not on file  Social Connections: Not on file     Vital Signs: There were no vitals taken for this visit.  Physical Exam Constitutional:      Appearance: Normal appearance.  HENT:     Mouth/Throat:     Mouth: Mucous membranes are moist.  Eyes:     Extraocular Movements: Extraocular movements intact.     Pupils: Pupils are equal, round, and reactive to light.  Neurological:     Mental Status: She is alert and oriented to person, place, and time.     Cranial Nerves: No cranial nerve deficit.     Motor: Weakness present.     Comments: Mild upper and lower extremity weakness.    Imaging: Carotid Arterial Duplex Study Summary:  Right Carotid: Patent stent.  Left Carotid: Velocities in the left ICA are consistent with a 1-39% stenosis.  Electronically signed by Orlie Pollen on 03/04/2021 at 4:22:34 PM.   Labs:  CBC: Recent Labs    09/14/20 0407 09/15/20 0410 09/17/20 0516 09/28/20 0841  WBC 6.0 5.7 5.8 5.0  HGB 9.9* 10.3* 10.8* 11.1*  HCT 30.8* 31.5* 33.8* 33.9*  PLT 123* 147* 183 242    COAGS: Recent Labs    09/10/20 1540  INR 1.0  APTT 26    BMP: Recent Labs    09/15/20 0410 09/16/20 0943 09/17/20 0516 09/28/20 0841  NA 138 136 137 136  K 3.1* 3.4* 3.5 3.1*  CL 105 105 106 103  CO2 25 23 24 25   GLUCOSE 88 153* 96 87  BUN 11 7* 12 23  CALCIUM 8.9 8.8* 9.0 9.3  CREATININE 0.64 0.84 0.76 0.69  GFRNONAA  >60 >60 >60 >60    LIVER FUNCTION TESTS: Recent Labs    09/13/20 0312 09/14/20 0407 09/15/20 0410 09/17/20 0516  BILITOT 0.2* 0.4 0.6 0.9  AST 16 15 14* 16  ALT 8 8 9 9   ALKPHOS 60 50 62 60  PROT 6.6 5.8* 6.2* 6.7  ALBUMIN 3.2* 2.9* 3.0* 3.2*    Assessment:  [name] estimated core a recovery since her stroke earlier this year.  She has not presented any new symptoms or complaints.  Given stable symptoms and duplex ultrasound showing widely patent right carotid stent, she may discontinue the Brilinta while remaining on daily aspirin.  Yearly  carotid duplex is recommended and may be followed by her primary care physician.  Should she develop new symptoms, in stent stenosis or new carotid disease, we would be happy to see her in clinic.   Signed: Baldemar Lenis, MD 03/22/2021, 12:05 PM    I spent a total of    15 Minutes in face to face in clinical consultation, greater than 50% of which was counseling/coordinating care for post carotid stenting care.

## 2021-04-23 ENCOUNTER — Ambulatory Visit (INDEPENDENT_AMBULATORY_CARE_PROVIDER_SITE_OTHER): Payer: Medicare Other | Admitting: Primary Care

## 2021-04-25 ENCOUNTER — Other Ambulatory Visit: Payer: Self-pay | Admitting: Physical Medicine & Rehabilitation

## 2021-04-25 NOTE — Telephone Encounter (Signed)
Needs to go to PCP

## 2021-04-29 ENCOUNTER — Other Ambulatory Visit: Payer: Self-pay | Admitting: Physical Medicine & Rehabilitation

## 2021-05-11 NOTE — Telephone Encounter (Signed)
Please advise patient that all additional refills will be through her family practice physician

## 2021-05-30 NOTE — Progress Notes (Signed)
Subjective:    Lovie Zarling - 76 y.o. female MRN [id number]  Date of birth: [birth year]  HPI  Danylah Holden is to establish care. She is accompanied by her daughter.   Current issues and/or concerns: Doing well on current regimen Amlodipine for high blood pressure. No side effects. No issues/concerns. Denies chest pain and shortness of breath. Monitoring salt in diet. Reports drinks a lot of soda. Not checking blood pressure at home.    ROS per HPI     Health Maintenance:  Health Maintenance Due  Topic Date Due   COVID-19 Vaccine (1) Never done   Pneumonia Vaccine 5+ Years old (1 - PCV) Never done   Hepatitis C Screening  Never done   TETANUS/TDAP  Never done   Zoster Vaccines- Shingrix (1 of 2) Never done   DEXA SCAN  Never done   INFLUENZA VACCINE  Never done     Past Medical History: Patient Active Problem List   Diagnosis Date Noted   Right shoulder pain    Acute blood loss anemia    Essential hypertension    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   Intraventricular hemorrhage (HCC) 09/16/2020   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 (HCC) 09/16/2020   CVA (cerebral vascular accident) (HCC) 09/16/2020   History of COPD    Dyslipidemia    Benign essential HTN    TIA (transient ischemic attack) 09/10/2020    Social History   reports that she quit smoking about 34 years ago. Her smoking use included cigarettes. She has never used smokeless tobacco. She reports that she does not currently use alcohol. She reports that she does not use drugs.   Family History  family history is not on file.   Medications: reviewed and updated   Objective:   Physical Exam BP 127/80 (BP Location: Left Arm, Patient Position: Sitting, Cuff Size: Large)    Pulse 73    Temp 98.9 F (37.2 C)    Resp 18    Ht 5\' 10"  (1.778 m)    Wt 188 lb (85.3 kg)    SpO2 98%    BMI 26.98 kg/m   Physical Exam HENT:     Head: Normocephalic and  atraumatic.  Eyes:     Extraocular Movements: Extraocular movements intact.     Conjunctiva/sclera: Conjunctivae normal.     Pupils: Pupils are equal, round, and reactive to light.  Cardiovascular:     Rate and Rhythm: Normal rate and regular rhythm.     Pulses: Normal pulses.     Heart sounds: Normal heart sounds.  Pulmonary:     Effort: Pulmonary effort is normal.     Breath sounds: Normal breath sounds.  Musculoskeletal:     Cervical back: Normal range of motion and neck supple.  Neurological:     General: No focal deficit present.     Mental Status: She is alert and oriented to person, place, and time.  Psychiatric:        Mood and Affect: Mood normal.        Behavior: Behavior normal.       Assessment & Plan:  1. Encounter to establish care: - Patient presents today to establish care.  - Return for annual physical examination, labs, and health maintenance. Arrive fasting meaning having no food for at least 8 hours prior to appointment. You may have only water or black coffee. Please take scheduled medications as normal.  2. Essential hypertension: - Continue Amlodipine as prescribed.  -  Counseled on blood pressure goal of less than 140/90, low-sodium, DASH diet, medication compliance, 150 minutes of moderate intensity exercise per week as tolerated. Discussed medication compliance, adverse effects. - Patient with no show Cardiology appointment on 12/30/2020. Will refer back for further evaluation and management.  - Follow-up with primary provider as scheduled. - Ambulatory referral to Cardiology - amLODipine (NORVASC) 5 MG tablet; Take 1 tablet (5 mg total) by mouth daily.  Dispense: 30 tablet; Refill: 2      Patient was given clear instructions to go to Emergency Department or return to medical center if symptoms don't improve, worsen, or new problems develop.The patient verbalized understanding.  I discussed the assessment and treatment plan with the patient. The  patient was provided an opportunity to ask questions and all were answered. The patient agreed with the plan and demonstrated an understanding of the instructions.   The patient was advised to call back or seek an in-person evaluation if the symptoms worsen or if the condition fails to improve as anticipated.    Ricky Stabs, NP 06/04/2021, 2:54 PM Primary Care at [facility]

## 2021-06-04 ENCOUNTER — Other Ambulatory Visit: Payer: Self-pay

## 2021-06-04 ENCOUNTER — Ambulatory Visit (INDEPENDENT_AMBULATORY_CARE_PROVIDER_SITE_OTHER): Payer: Medicare Other | Admitting: Family

## 2021-06-04 ENCOUNTER — Encounter: Payer: Self-pay | Admitting: Family

## 2021-06-04 VITALS — BP 127/80 | HR 73 | Temp 98.9°F | Resp 18 | Ht 70.0 in | Wt 188.0 lb

## 2021-06-04 DIAGNOSIS — I10 Essential (primary) hypertension: Secondary | ICD-10-CM

## 2021-06-04 DIAGNOSIS — Z7689 Persons encountering health services in other specified circumstances: Secondary | ICD-10-CM

## 2021-06-04 MED ORDER — AMLODIPINE BESYLATE 5 MG PO TABS: 5.0000 mg | ORAL_TABLET | Freq: Every day | ORAL | 2 refills | Status: DC

## 2021-06-04 NOTE — Progress Notes (Signed)
Pt presents to establish care, needs refill on Amlodipine

## 2021-06-04 NOTE — Patient Instructions (Signed)
Thank you for choosing Primary Care at [facility] for your medical home!    [name] was seen by Rema Fendt, NP today.   [name]'s primary care provider is Rema Fendt, NP.   For the best care possible,  you should try to see Ricky Stabs, NP whenever you come to clinic.   We look forward to seeing you again soon!  If you have any questions about your visit today,  please call us at [phone]  Or feel free to reach your provider via MyChart.    Keeping you healthy   Get these tests Blood pressure- Have your blood pressure checked once a year by your healthcare provider.  Normal blood pressure is 120/80. Weight- Have your body mass index (BMI) calculated to screen for obesity.  BMI is a measure of body fat based on height and weight. You can also calculate your own BMI at https://www.west-esparza.com/. Cholesterol- Have your cholesterol checked regularly starting at age 79, sooner may be necessary if you have diabetes, high blood pressure, if a family member developed heart diseases at an early age or if you smoke.  Chlamydia, HIV, and other sexual transmitted disease- Get screened each year until the age of 42 then within three months of each new sexual partner. Diabetes- Have your blood sugar checked regularly if you have high blood pressure, high cholesterol, a family history of diabetes or if you are overweight.   Get these vaccines Flu shot- Every fall. Tetanus shot- Every 10 years. Menactra- Single dose; prevents meningitis.   Take these steps Don't smoke- If you do smoke, ask your healthcare provider about quitting. For tips on how to quit, go to www.smokefree.gov or call 1-800-QUIT-NOW. Be physically active- Exercise 5 days a week for at least 30 minutes.  If you are not already physically active start slow and gradually work up to 30 minutes of moderate physical activity.  Examples of moderate activity include walking briskly, mowing the yard, dancing,  swimming bicycling, etc. Eat a healthy diet- Eat a variety of healthy foods such as fruits, vegetables, low fat milk, low fat cheese, yogurt, lean meats, poultry, fish, beans, tofu, etc.  For more information on healthy eating, go to www.thenutritionsource.org Drink alcohol in moderation- Limit alcohol intake two drinks or less a day.  Never drink and drive. Dentist- Brush and floss teeth twice daily; visit your dentis twice a year. Depression-Your emotional health is as important as your physical health.  If you're feeling down, losing interest in things you normally enjoy please talk with your healthcare provider. Gun Safety- If you keep a gun in your home, keep it unloaded and with the safety lock on.  Bullets should be stored separately. Helmet use- Always wear a helmet when riding a motorcycle, bicycle, rollerblading or skateboarding. Safe sex- If you may be exposed to a sexually transmitted infection, use a condom Seat belts- Seat bels can save your life; always wear one. Smoke/Carbon Monoxide detectors- These detectors need to be installed on the appropriate level of your home.  Replace batteries at least once a year. Skin Cancer- When out in the sun, cover up and use sunscreen SPF 15 or higher. Violence- If anyone is threatening or hurting you, please tell your healthcare provider.

## 2021-07-06 ENCOUNTER — Encounter: Payer: Self-pay | Admitting: Cardiovascular Disease

## 2021-07-06 ENCOUNTER — Ambulatory Visit (INDEPENDENT_AMBULATORY_CARE_PROVIDER_SITE_OTHER): Payer: Medicare Other | Admitting: Cardiovascular Disease

## 2021-07-06 ENCOUNTER — Other Ambulatory Visit: Payer: Self-pay

## 2021-07-06 VITALS — BP 140/70 | HR 63 | Ht 70.0 in | Wt 191.0 lb

## 2021-07-06 DIAGNOSIS — I10 Essential (primary) hypertension: Secondary | ICD-10-CM

## 2021-07-06 DIAGNOSIS — Z8673 Personal history of transient ischemic attack (TIA), and cerebral infarction without residual deficits: Secondary | ICD-10-CM

## 2021-07-06 DIAGNOSIS — E782 Mixed hyperlipidemia: Secondary | ICD-10-CM

## 2021-07-06 NOTE — Patient Instructions (Signed)
Medication Instructions:  Your physician recommends that you continue on your current medications as directed. Please refer to the Current Medication list given to you today.  *If you need a refill on your cardiac medications before your next appointment, please call your pharmacy*   Lab Work: TODAY: Lipid panel, ALT, BMP If you have labs (blood work) drawn today and your tests are completely normal, you will receive your results only by: MyChart Message (if you have MyChart) OR A paper copy in the mail If you have any lab test that is abnormal or we need to change your treatment, we will call you to review the results.   Testing/Procedures: NONE   Follow-Up: At [facility], you and your health needs are our priority.  As part of our continuing mission to provide you with exceptional heart care, we have created designated Provider Care Teams.  These Care Teams include your primary Cardiologist (physician) and Advanced Practice Providers (APPs -  Physician Assistants and Nurse Practitioners) who all work together to provide you with the care you need, when you need it.  We recommend signing up for the patient portal called "MyChart".  Sign up information is provided on this After Visit Summary.  MyChart is used to connect with patients for Virtual Visits (Telemedicine).  Patients are able to view lab/test results, encounter notes, upcoming appointments, etc.  Non-urgent messages can be sent to your provider as well.   To learn more about what you can do with MyChart, go to [email or web address].    Your next appointment:   1 year(s)  The format for your next appointment:   In Person  Provider:   Kristeen Miss, MD

## 2021-07-06 NOTE — Progress Notes (Signed)
Cardiology Office Note:    Date:  07/06/2021   ID:  [name], DOB 10-22-1945, MRN [id number]  PCP:  Rema Fendt, NP   [facility] Providers Cardiologist:  Maikel Neisler  1}    Referring MD: Rema Fendt, NP   Chief Complaint  Patient presents with   Hypertension          History of Present Illness:     Seen with daughter, [name] is a 76 y.o. female with a hx of COPD, DVT, HTN. We were asked to see her for evaluation of her HTN by Dr. Zonia Kief  Hx of stroke followed by carotid artery stenting by Dr. Jerilynn Mages  Was on ASA and Brilinta and now is just on ASA alone .    Her BP has been well controlled since she has been on the medications for her HTN.  Has cut out most of her salt  Has recovered from her stroke   No CP or dyspnea  Walks outside on occasion.  BP is slightly elevated today - she did not take her BP meds.    Past Medical History:  Diagnosis Date   COPD (chronic obstructive pulmonary disease) (HCC)    History of DVT (deep vein thrombosis)    Hypertension    Thoracoabdominal aortic aneurysm     Past Surgical History:  Procedure Laterality Date   IR CT HEAD LTD  09/11/2020   IR INTRAVSC STENT CERV CAROTID W/O EMB-PROT MOD SED INC ANGIO  09/11/2020   IR PERCUTANEOUS ART THROMBECTOMY/INFUSION INTRACRANIAL INC DIAG ANGIO  09/11/2020       IR PERCUTANEOUS ART THROMBECTOMY/INFUSION INTRACRANIAL INC DIAG ANGIO  09/11/2020   IR US GUIDE VASC ACCESS RIGHT  09/11/2020   NO PAST SURGERIES     RADIOLOGY WITH ANESTHESIA N/A 09/11/2020   Procedure: IR WITH ANESTHESIA;  Surgeon: Julieanne Cotton, MD;  Location: [facility] OR;  Service: Radiology;  Laterality: N/A;    Current Medications: Current Meds  Medication Sig   amLODipine (NORVASC) 5 MG tablet Take 1 tablet (5 mg total) by mouth daily.   aspirin EC 81 MG EC tablet Take 1 tablet (81 mg total) by mouth daily. Swallow whole.   atorvastatin (LIPITOR) 80 MG tablet TAKE 1 TABLET(80 MG) BY MOUTH AT  BEDTIME     Allergies:   Patient has no known allergies.   Social History   Socioeconomic History   Marital status: Single    Spouse name: Not on file   Number of children: Not on file   Years of education: Not on file   Highest education level: Not on file  Occupational History   Not on file  Tobacco Use   Smoking status: Former    Types: Cigarettes    Quit date: 05/10/1987    Years since quitting: 34.1   Smokeless tobacco: Never  Vaping Use   Vaping Use: Never used  Substance and Sexual Activity   Alcohol use: Not Currently   Drug use: Never   Sexual activity: Not Currently    Birth control/protection: None  Other Topics Concern   Not on file  Social History Narrative   Not on file   Social Determinants of Health   Financial Resource Strain: Not on file  Food Insecurity: Not on file  Transportation Needs: Not on file  Physical Activity: Not on file  Stress: Not on file  Social Connections: Not on file     Family History: The patient's family history includes  Hypertension in her mother.  ROS:   Please see the history of present illness.     All other systems reviewed and are negative.  EKGs/Labs/Other Studies Reviewed:    The following studies were reviewed today:   EKG:   07/06/21:  Normal sinus rhythm.  She has nonspecific ST and T wave abnormalities-?  LVH.  Recent Labs: 09/16/2020: Magnesium 1.9 09/17/2020: ALT 9 09/28/2020: BUN 23; Creatinine, Ser 0.69; Hemoglobin 11.1; Platelets 242; Potassium 3.1; Sodium 136  Recent Lipid Panel    Component Value Date/Time   CHOL 230 (H) 09/12/2020 0025   TRIG 83 09/12/2020 0025   HDL 53 09/12/2020 0025   CHOLHDL 4.3 09/12/2020 0025   VLDL 17 09/12/2020 0025   LDLCALC 160 (H) 09/12/2020 0025     Risk Assessment/Calculations:           Physical Exam:    VS:  BP 140/70 (BP Location: Left Arm, Patient Position: Sitting, Cuff Size: Normal)    Pulse 63    Ht 5\' 10"  (1.778 m)    Wt 191 lb (86.6 kg)    SpO2  96%    BMI 27.41 kg/m     Wt Readings from Last 3 Encounters:  07/06/21 191 lb (86.6 kg)  06/04/21 188 lb (85.3 kg)  02/19/21 187 lb (84.8 kg)     GEN:  Well nourished, well developed in no acute distress HEENT: Normal NECK: No JVD; No carotid bruits LYMPHATICS: No lymphadenopathy CARDIAC: RRR, no murmurs, rubs, gallops RESPIRATORY:  Clear to auscultation without rales, wheezing or rhonchi  ABDOMEN: Soft, non-tender, non-distended MUSCULOSKELETAL:  No edema; No deformity  SKIN: Warm and dry NEUROLOGIC:  Alert and oriented x 3 PSYCHIATRIC:  Normal affect   ASSESSMENT:    1. Hypertension, unspecified type   2. Mixed hyperlipidemia   3. History of stroke    PLAN:    In order of problems listed above:  Hypertension: Patient presents for further evaluation management of her hypertension.  Her blood pressure is typically fairly well controlled.  Blood pressure is mildly elevated today but it should be noted that she did not take her amlodipine this morning.  At this point I do not think that she needs any additional medications.  Her daughter has been doing the cooking and has been cooking a low-salt diet.  Continue with low-salt diet and continue with current medications.  2.  Hyperlipidemia: She has been on atorvastatin.  Her last lipid levels were from May of last year.  We will check lipids, ALT, basic metabolic profile today.  3.  History of stroke: She has a history of stroke and is status post carotid stenting.  She will continue to follow-up with her primary medical doctor.           Medication Adjustments/Labs and Tests Ordered: Current medicines are reviewed at length with the patient today.  Concerns regarding medicines are outlined above.  Orders Placed This Encounter  Procedures   Lipid panel   ALT   Basic metabolic panel   EKG 12-Lead   No orders of the defined types were placed in this encounter.   Patient Instructions  Medication Instructions:  Your  physician recommends that you continue on your current medications as directed. Please refer to the Current Medication list given to you today.  *If you need a refill on your cardiac medications before your next appointment, please call your pharmacy*   Lab Work: TODAY: Lipid panel, ALT, BMP If you have labs (blood  work) drawn today and your tests are completely normal, you will receive your results only by: MyChart Message (if you have MyChart) OR A paper copy in the mail If you have any lab test that is abnormal or we need to change your treatment, we will call you to review the results.   Testing/Procedures: NONE   Follow-Up: At [facility], you and your health needs are our priority.  As part of our continuing mission to provide you with exceptional heart care, we have created designated Provider Care Teams.  These Care Teams include your primary Cardiologist (physician) and Advanced Practice Providers (APPs -  Physician Assistants and Nurse Practitioners) who all work together to provide you with the care you need, when you need it.  We recommend signing up for the patient portal called "MyChart".  Sign up information is provided on this After Visit Summary.  MyChart is used to connect with patients for Virtual Visits (Telemedicine).  Patients are able to view lab/test results, encounter notes, upcoming appointments, etc.  Non-urgent messages can be sent to your provider as well.   To learn more about what you can do with MyChart, go to [email or web address].    Your next appointment:   1 year(s)  The format for your next appointment:   In Person  Provider:   Kristeen Miss, MD       Signed, Kristeen Miss, MD  07/06/2021 5:28 PM    [facility]

## 2021-07-07 ENCOUNTER — Telehealth: Payer: Self-pay

## 2021-07-07 DIAGNOSIS — E782 Mixed hyperlipidemia: Secondary | ICD-10-CM

## 2021-07-07 LAB — ALT: ALT: 13 IU/L (ref 0–32)

## 2021-07-07 LAB — BASIC METABOLIC PANEL
BUN/Creatinine Ratio: 22 (ref 12–28)
BUN: 17 mg/dL (ref 8–27)
CO2: 23 mmol/L (ref 20–29)
Calcium: 9.1 mg/dL (ref 8.7–10.3)
Chloride: 104 mmol/L (ref 96–106)
Creatinine, Ser: 0.78 mg/dL (ref 0.57–1.00)
Glucose: 100 mg/dL — ABNORMAL HIGH (ref 70–99)
Potassium: 3.2 mmol/L — ABNORMAL LOW (ref 3.5–5.2)
Sodium: 141 mmol/L (ref 134–144)
eGFR: 78 mL/min/{1.73_m2} (ref >59)

## 2021-07-07 LAB — LIPID PANEL
Chol/HDL Ratio: 2.8 ratio (ref 0.0–4.4)
Cholesterol, Total: 212 mg/dL — ABNORMAL HIGH (ref 100–199)
HDL: 77 mg/dL (ref >39)
LDL Chol Calc (NIH): 116 mg/dL — ABNORMAL HIGH (ref 0–99)
Triglycerides: 109 mg/dL (ref 0–149)
VLDL Cholesterol Cal: 19 mg/dL (ref 5–40)

## 2021-07-07 MED ORDER — ROSUVASTATIN CALCIUM 20 MG PO TABS: 20.0000 mg | ORAL_TABLET | Freq: Every day | ORAL | 3 refills | Status: DC

## 2021-07-07 NOTE — Telephone Encounter (Signed)
Spoke with patient's daughter, [name] (on [place]) regarding lab results and Dr. Harvie Bridge recommendations. ? ?Per Dr. Elease Hashimoto discontinue atorvastatin and start Rosuvastatin 20mg  QD (sent to pharmacy of choice).  ? ?Scheduled F/U Lipids for 09/06/21. ? ?Daughter verbalized understanding and agrees with plan above. States she will let patient know of the changes and F/U labs. ?

## 2021-07-07 NOTE — Telephone Encounter (Signed)
-----   Message from Vesta Mixer, MD sent at 07/07/2021 10:41 AM EST ----- ?Hx of carotid stenting  ?LDL is 116 - her goal is < 70 ?DC atorvastatin  ?Start rosuvastatin 20 mg a day  ?Lipids in 3 months  ? ?

## 2021-09-06 ENCOUNTER — Other Ambulatory Visit: Payer: Medicare Other

## 2021-12-21 IMAGING — CT CT HEAD W/O CM
4 series · 16 of 47 positions shown, 18 images · non-contrast
Comparison: Prior studies from 09/11/2020.

CLINICAL DATA: Initial evaluation for new onset right arm weakness,
history of recent stroke.

EXAM:
CT HEAD WITHOUT CONTRAST
TECHNIQUE: Contiguous axial images were obtained from the base of the skull
through the vertex without intravenous contrast.

[Series 3: head without · axial · non-contrast · 0.51mm/px · z∈[+1272,+1392]mm · 6 of 34 slices shown, 8 images]
[im 5/34  brain]
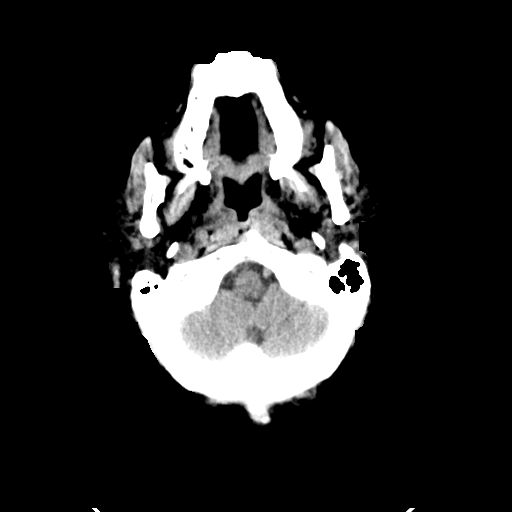
[im 5/34  bone]
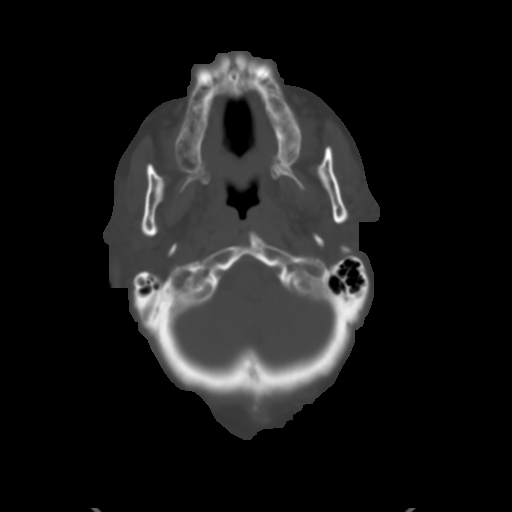
[im 10/34  brain]
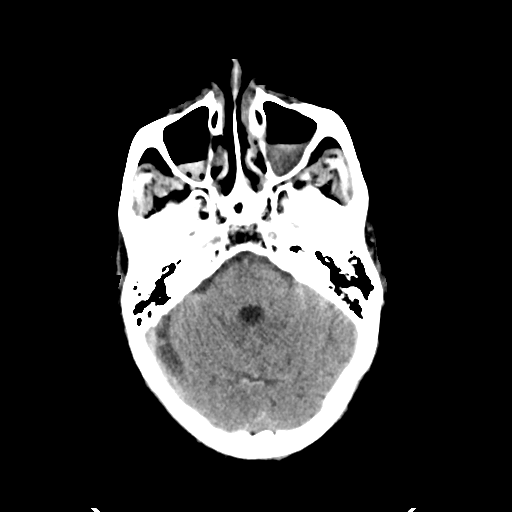
[im 15/34  brain]
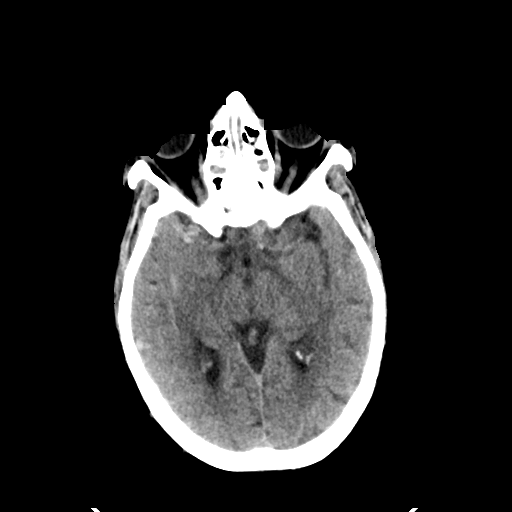
[im 19/34  brain]
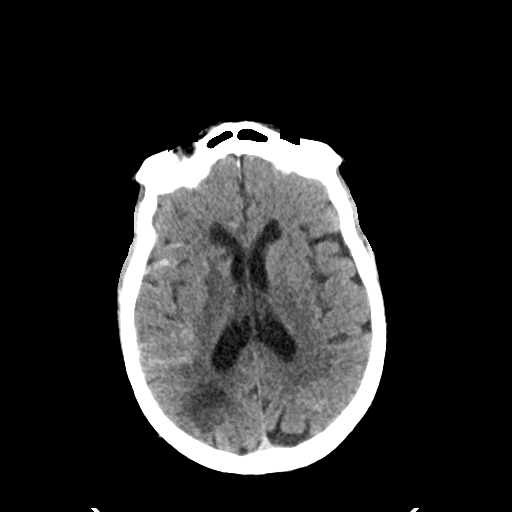
[im 24/34  brain]
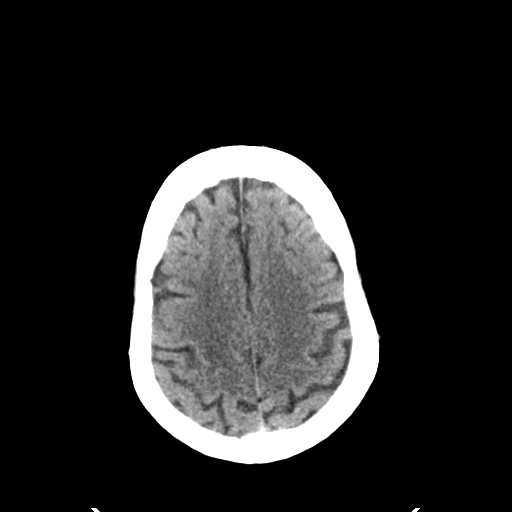
[im 24/34  bone]
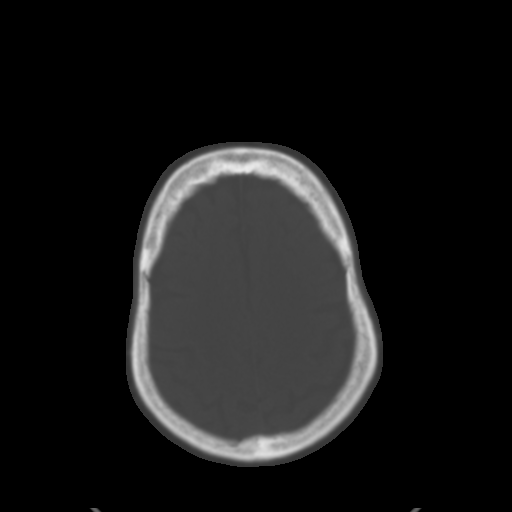
[im 29/34  brain]
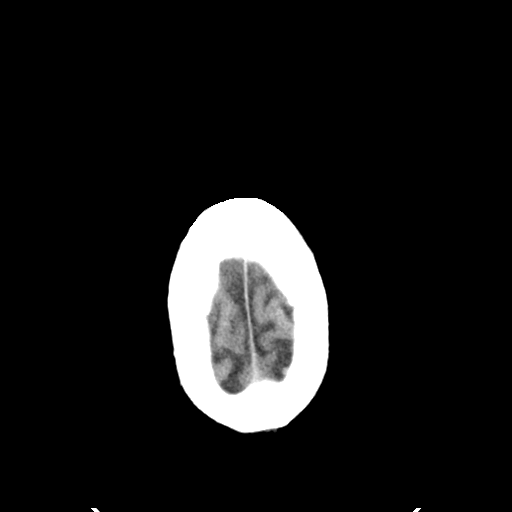

[Series 4: head bone · axial · 0.51mm/px · z∈[+1268,+1324]mm · 4 of 87 slices shown]
[im 9/87  bone]
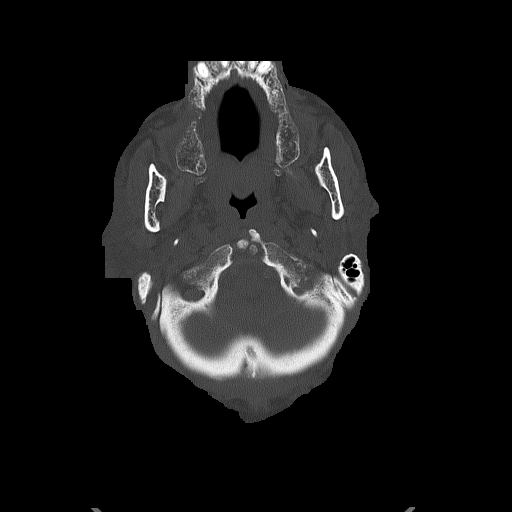
[im 17/87  bone]
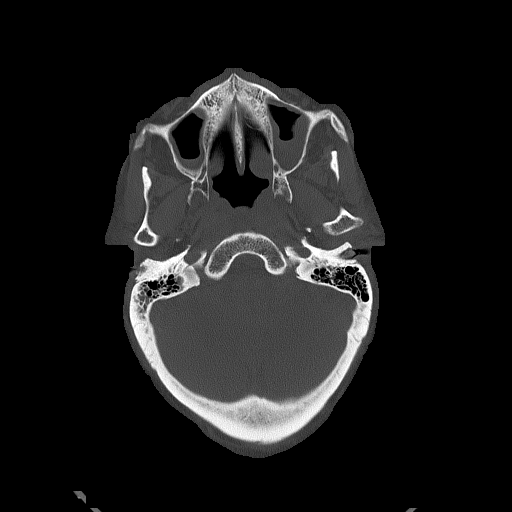
[im 29/87  bone]
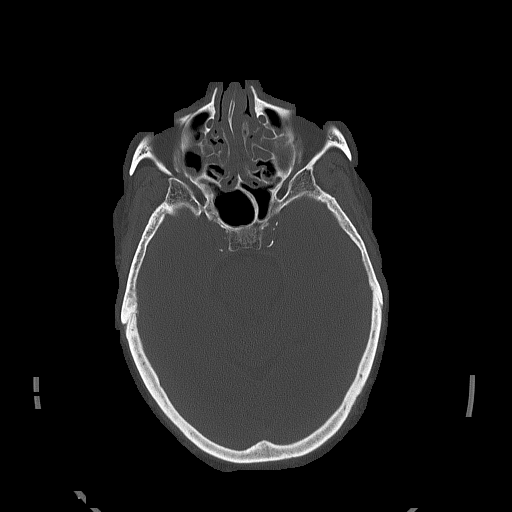
[im 37/87  bone]
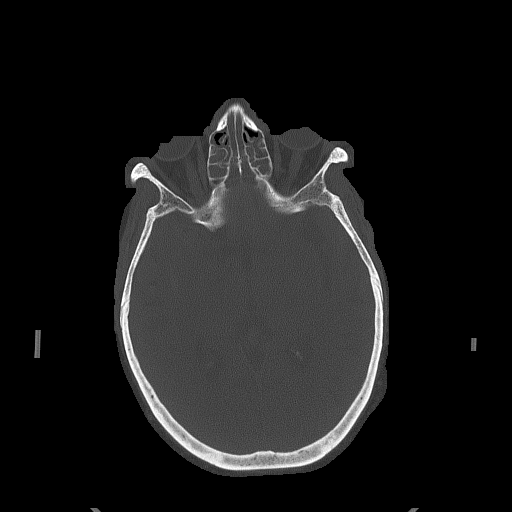

[Series 5: head without cor · coronal · non-contrast · 0.34mm/px · 3 of 82 slices shown]
[im 28/82  brain]
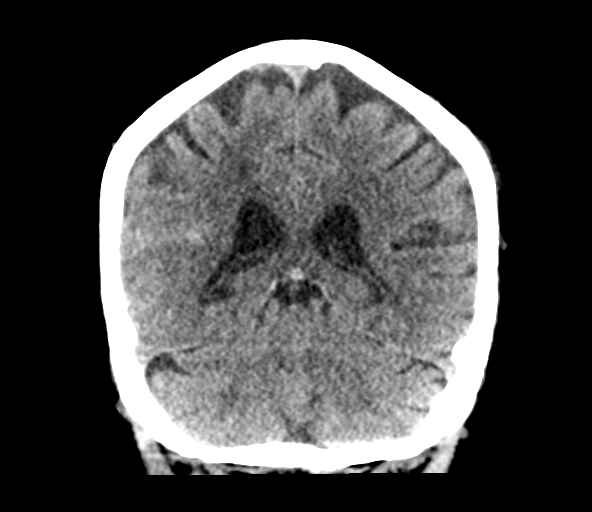
[im 37/82  brain]
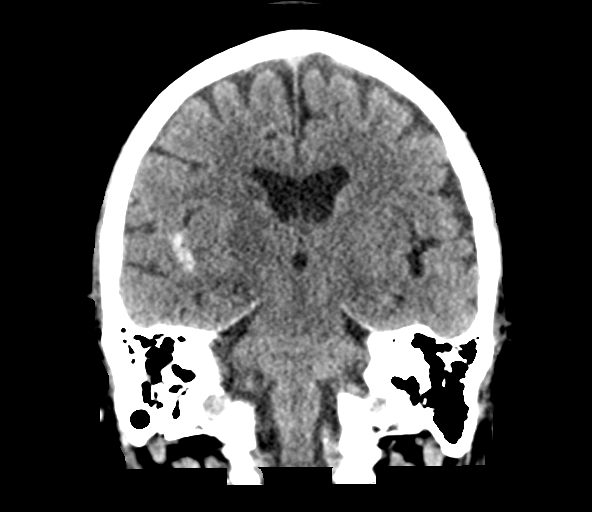
[im 46/82  brain]
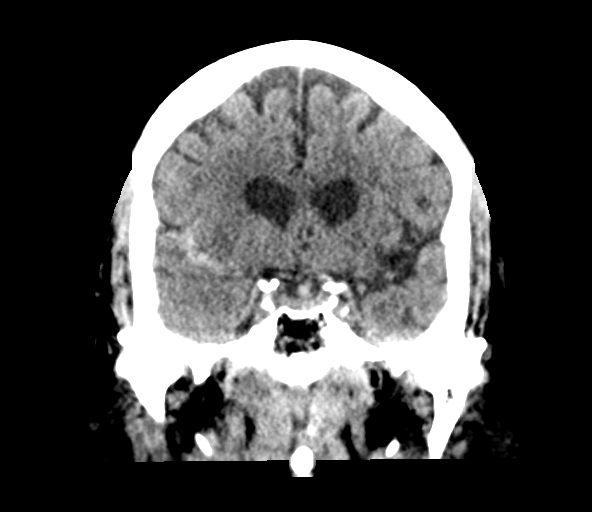

[Series 6: head without sag · sagittal · non-contrast · 0.36mm/px · 3 of 61 slices shown]
[im 21/61  brain]
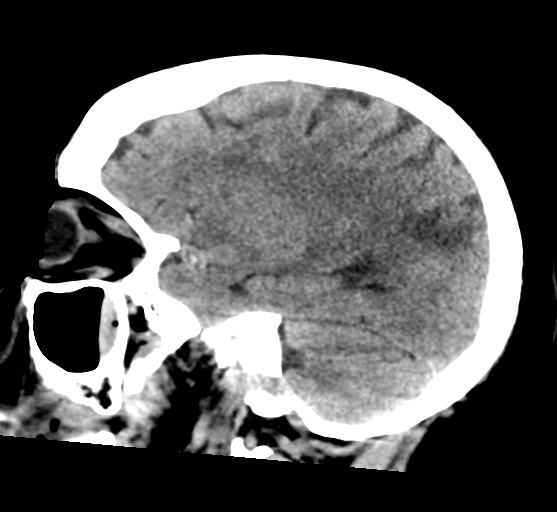
[im 31/61  brain]
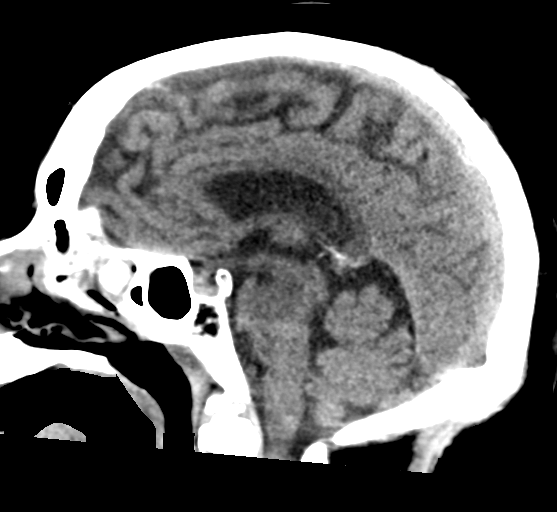
[im 41/61  brain]
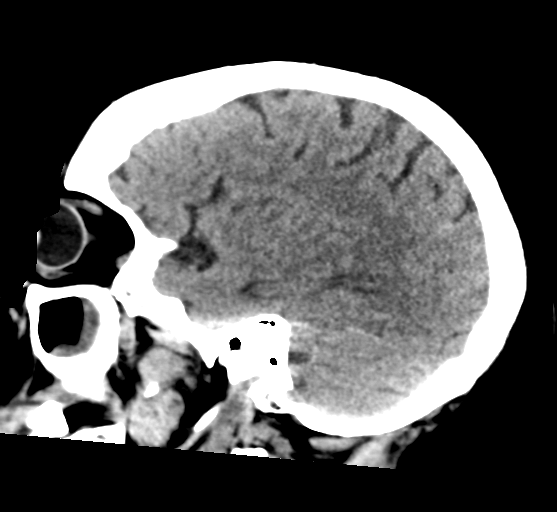

[16 of 47 positions shown; findings below may reference images not displayed]

FINDINGS: Brain: Patchy hypodensities involving the right basal ganglia and
posterior right parieto-occipital region, consistent with evolving
acute to subacute right MCA distribution infarcts. Since previous
exam, there has been interval development of scattered subarachnoid
hemorrhage involving the right sylvian fissure and overlying right
frontal cortical sulci. Small volume intraventricular hemorrhage
seen as well, with blood seen layering within the occipital horns of
both lateral ventricles, likely related to redistribution. No
hydrocephalus or ventricular trapping. No significant mass effect or
midline shift. Basilar cisterns remain patent.

No other definite acute large vessel territory infarct. No mass
lesion or extra-axial fluid collection. Underlying atrophy with
chronic microvascular ischemic disease again noted.

Vascular: No visible hyperdense vessel.

Skull: Scalp soft tissues and calvarium within normal limits.

Sinuses/Orbits: Globes and orbital soft tissues within normal
limits. Moderate mucosal thickening in opacity seen throughout the
paranasal sinuses, with air-fluid levels present within both
maxillary sinuses. Small right mastoid effusion noted.

Other: None.
IMPRESSION: 1. Interval development of scattered subarachnoid hemorrhage
involving the right Sylvian fissure and overlying right frontal
cortical sulci. Small volume intraventricular hemorrhage seen as
well, likely related to redistribution. No hydrocephalus or
ventricular trapping.
2. Evolving acute to subacute right MCA distribution infarcts. No
significant mass effect or midline shift.
3. No other new acute intracranial abnormality.
4. Underlying atrophy with chronic microvascular ischemic disease.

Critical Value/emergent results were called by telephone at the time
of interpretation on 09/15/2020 at [DATE] to provider ONKAR
TIGER , who verbally acknowledged these results.

## 2021-12-22 ENCOUNTER — Other Ambulatory Visit: Payer: Self-pay

## 2021-12-22 DIAGNOSIS — I10 Essential (primary) hypertension: Secondary | ICD-10-CM

## 2021-12-22 MED ORDER — AMLODIPINE BESYLATE 5 MG PO TABS: 5.0000 mg | ORAL_TABLET | Freq: Every day | ORAL | 0 refills | Status: DC

## 2022-05-06 ENCOUNTER — Telehealth: Payer: Self-pay | Admitting: Family

## 2022-05-06 NOTE — Telephone Encounter (Signed)
Tried calling patient to schedule Medicare Annual Wellness Visit (AWV) either virtually or phone  my jabber number [phone]  No answer   awvi 07/07/20 per [facility]  please schedule with Nurse Health Adviser   45 min for awv-i and in office appointments 30 min for awv-s  phone/virtual appointments

## 2022-05-23 ENCOUNTER — Other Ambulatory Visit: Payer: Self-pay

## 2022-05-23 DIAGNOSIS — I10 Essential (primary) hypertension: Secondary | ICD-10-CM

## 2022-05-23 MED ORDER — AMLODIPINE BESYLATE 5 MG PO TABS: 5.0000 mg | ORAL_TABLET | Freq: Every day | ORAL | 0 refills | Status: DC

## 2022-09-16 ENCOUNTER — Telehealth: Payer: Self-pay | Admitting: Family

## 2022-09-16 NOTE — Telephone Encounter (Signed)
Called patient to schedule Medicare Annual Wellness Visit (AWV). Left message for patient to call back and schedule Medicare Annual Wellness Visit (AWV).  Last date of AWV:  awvi 07/07/20 per [facility]   Please transfer to Idris Edmundson @ [phone]  Thank you ,  Rudell Cobb AWV direct phone # [phone]

## 2022-11-14 ENCOUNTER — Encounter: Payer: Self-pay | Admitting: Cardiovascular Disease

## 2022-11-14 NOTE — Progress Notes (Signed)
Cardiology Office Note:    Date:  11/14/2022   ID:  [name], DOB 24-Jan-1946, MRN [id number]  PCP:  Rema Fendt, NP   [facility] Providers Cardiologist:  Kallen Delatorre  1}    Referring MD: Rema Fendt, NP   Chief Complaint  Patient presents with   Hyperlipidemia   Hypertension          History of Present Illness:     Seen with daughter, [name] is a 77 y.o. female with a hx of COPD, DVT, HTN. We were asked to see her for evaluation of her HTN by Dr. Zonia Kief  Hx of stroke followed by carotid artery stenting by Dr. Jerilynn Mages  Was on ASA and Brilinta and now is just on ASA alone .    Her BP has been well controlled since she has been on the medications for her HTN.  Has cut out most of her salt  Has recovered from her stroke   No CP or dyspnea  Walks outside on occasion.  BP is slightly elevated today - she did not take her BP meds.     November 15, 2022: [name] is seen today for follow-up of her hypertension.  Past Medical History:  Diagnosis Date   COPD (chronic obstructive pulmonary disease) (HCC)    History of DVT (deep vein thrombosis)    Hypertension    Thoracoabdominal aortic aneurysm [facility])     Past Surgical History:  Procedure Laterality Date   IR CT HEAD LTD  09/11/2020   IR INTRAVSC STENT CERV CAROTID W/O EMB-PROT MOD SED INC ANGIO  09/11/2020   IR PERCUTANEOUS ART THROMBECTOMY/INFUSION INTRACRANIAL INC DIAG ANGIO  09/11/2020       IR PERCUTANEOUS ART THROMBECTOMY/INFUSION INTRACRANIAL INC DIAG ANGIO  09/11/2020   IR US GUIDE VASC ACCESS RIGHT  09/11/2020   NO PAST SURGERIES     RADIOLOGY WITH ANESTHESIA N/A 09/11/2020   Procedure: IR WITH ANESTHESIA;  Surgeon: Julieanne Cotton, MD;  Location: [facility] OR;  Service: Radiology;  Laterality: N/A;    Current Medications: No outpatient medications have been marked as taking for the 11/15/22 encounter (Office Visit) with Rudene Poulsen, Deloris Ping, MD.     Allergies:   Patient has no known allergies.    Social History   Socioeconomic History   Marital status: Single    Spouse name: Not on file   Number of children: Not on file   Years of education: Not on file   Highest education level: Not on file  Occupational History   Not on file  Tobacco Use   Smoking status: Former    Types: Cigarettes    Quit date: 05/10/1987    Years since quitting: 35.5   Smokeless tobacco: Never  Vaping Use   Vaping Use: Never used  Substance and Sexual Activity   Alcohol use: Not Currently   Drug use: Never   Sexual activity: Not Currently    Birth control/protection: None  Other Topics Concern   Not on file  Social History Narrative   Not on file   Social Determinants of Health   Financial Resource Strain: Not on file  Food Insecurity: Not on file  Transportation Needs: Not on file  Physical Activity: Not on file  Stress: Not on file  Social Connections: Not on file     Family History: The patient's family history includes Hypertension in her mother.  ROS:   Please see the history of present illness.  All other systems reviewed and are negative.  EKGs/Labs/Other Studies Reviewed:    The following studies were reviewed today:   EKG:     Recent Labs: No results found for requested labs within last 365 days.  Recent Lipid Panel    Component Value Date/Time   CHOL 212 (H) 07/06/2021 1448   TRIG 109 07/06/2021 1448   HDL 77 07/06/2021 1448   CHOLHDL 2.8 07/06/2021 1448   CHOLHDL 4.3 09/12/2020 0025   VLDL 17 09/12/2020 0025   LDLCALC 116 (H) 07/06/2021 1448     Risk Assessment/Calculations:           Physical Exam:    Physical Exam: There were no vitals taken for this visit.  No BP recorded.  {Refresh Note OR Click here to enter BP  :1}***    GEN:  Well nourished, well developed in no acute distress HEENT: Normal NECK: No JVD; No carotid bruits LYMPHATICS: No lymphadenopathy CARDIAC: RRR ***, no murmurs, rubs, gallops RESPIRATORY:  Clear to auscultation  without rales, wheezing or rhonchi  ABDOMEN: Soft, non-tender, non-distended MUSCULOSKELETAL:  No edema; No deformity  SKIN: Warm and dry NEUROLOGIC:  Alert and oriented x 3   ASSESSMENT:    No diagnosis found.  PLAN:      Hypertension:    2.  Hyperlipidemia:    3.  History of stroke:            Medication Adjustments/Labs and Tests Ordered: Current medicines are reviewed at length with the patient today.  Concerns regarding medicines are outlined above.  No orders of the defined types were placed in this encounter.  No orders of the defined types were placed in this encounter.   There are no Patient Instructions on file for this visit.   Signed, Kristeen Miss, MD  11/14/2022 5:22 PM     [facility]

## 2022-11-15 ENCOUNTER — Ambulatory Visit: Payer: 59 | Attending: Cardiovascular Disease | Admitting: Cardiovascular Disease

## 2022-11-16 ENCOUNTER — Encounter: Payer: Self-pay | Admitting: Cardiovascular Disease

## 2023-05-30 ENCOUNTER — Ambulatory Visit (INDEPENDENT_AMBULATORY_CARE_PROVIDER_SITE_OTHER): Payer: 59 | Admitting: Family

## 2023-05-30 VITALS — BP 159/72 | HR 62 | Temp 98.7°F | Ht 70.0 in | Wt 210.4 lb

## 2023-05-30 DIAGNOSIS — E785 Hyperlipidemia, unspecified: Secondary | ICD-10-CM

## 2023-05-30 DIAGNOSIS — I10 Essential (primary) hypertension: Secondary | ICD-10-CM | POA: Diagnosis not present

## 2023-05-30 MED ORDER — HYDRALAZINE HCL 10 MG PO TABS
50.0000 mg | ORAL_TABLET | Freq: Once | ORAL | Status: AC
  Administered 2023-05-30: 50 mg via ORAL

## 2023-05-30 MED ORDER — ROSUVASTATIN CALCIUM 20 MG PO TABS
20.0000 mg | ORAL_TABLET | Freq: Every day | ORAL | 0 refills | Status: DC
Start: 2023-05-30 — End: 2023-08-23

## 2023-05-30 MED ORDER — AMLODIPINE BESYLATE 5 MG PO TABS: 5.0000 mg | ORAL_TABLET | Freq: Every day | ORAL | 0 refills | Status: DC

## 2023-05-30 MED ORDER — VALSARTAN 40 MG PO TABS: 40.0000 mg | ORAL_TABLET | Freq: Every day | ORAL | 1 refills | Status: DC

## 2023-05-30 NOTE — Progress Notes (Signed)
Patient here for blood pressure medication, refill sent in.

## 2023-05-30 NOTE — Progress Notes (Signed)
Patient ID: [name], female    DOB: May 03, 1946  MRN: [id number]  CC: Chronic Conditions Follow-Up  Subjective: [name] is a 78 y.o. female who presents for chronic conditions follow-up.   Her concerns today include:  - States she has not taken Amlodipine since July 2024. She does not check blood pressure at home. She does not watch salt intake. She does not complain of red flag symptoms such as but not limited to chest pain, shortness of breath, worst headache of life, nausea/vomiting.  - States she has not taken Rosuvastatin since July 2024.  - No further issues/concerns for discussion today.  Patient Active Problem List   Diagnosis Date Noted   Right shoulder pain    Acute blood loss anemia    Essential hypertension    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   Intraventricular hemorrhage (HCC) 09/16/2020   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 09/16/2020   CVA (cerebral vascular accident) (HCC) 09/16/2020   History of COPD    Dyslipidemia    Benign essential HTN    TIA (transient ischemic attack) 09/10/2020     Current Outpatient Medications on File Prior to Visit  Medication Sig Dispense Refill   aspirin EC 81 MG EC tablet Take 1 tablet (81 mg total) by mouth daily. Swallow whole. (Patient not taking: Reported on 05/30/2023) 30 tablet 0   No current facility-administered medications on file prior to visit.    No Known Allergies  Social History   Socioeconomic History   Marital status: Single    Spouse name: Not on file   Number of children: Not on file   Years of education: Not on file   Highest education level: Not on file  Occupational History   Not on file  Tobacco Use   Smoking status: Former    Current packs/day: 0.00    Types: Cigarettes    Quit date: 05/10/1987    Years since quitting: 36.0   Smokeless tobacco: Never  Vaping Use   Vaping status: Never Used  Substance and Sexual  Activity   Alcohol use: Not Currently   Drug use: Never   Sexual activity: Not Currently    Birth control/protection: None  Other Topics Concern   Not on file  Social History Narrative   Not on file   Social Drivers of Health   Financial Resource Strain: Not on file  Food Insecurity: Not on file  Transportation Needs: Not on file  Physical Activity: Not on file  Stress: Not on file  Social Connections: Not on file  Intimate Partner Violence: Not on file    Family History  Problem Relation Age of Onset   Hypertension Mother     Past Surgical History:  Procedure Laterality Date   IR CT HEAD LTD  09/11/2020   IR INTRAVSC STENT CERV CAROTID W/O EMB-PROT MOD SED INC ANGIO  09/11/2020   IR PERCUTANEOUS ART THROMBECTOMY/INFUSION INTRACRANIAL INC DIAG ANGIO  09/11/2020       IR PERCUTANEOUS ART THROMBECTOMY/INFUSION INTRACRANIAL INC DIAG ANGIO  09/11/2020   IR US GUIDE VASC ACCESS RIGHT  09/11/2020   NO PAST SURGERIES     RADIOLOGY WITH ANESTHESIA N/A 09/11/2020   Procedure: IR WITH ANESTHESIA;  Surgeon: Julieanne Cotton, MD;  Location: [facility] OR;  Service: Radiology;  Laterality: N/A;    ROS: Review of Systems Negative except as stated above  PHYSICAL EXAM: BP (!) 159/72   Pulse 62   Temp 98.7 F (  37.1 C) (Oral)   Ht 5\' 10"  (1.778 m)   Wt 210 lb 6.4 oz (95.4 kg)   SpO2 95%   BMI 30.19 kg/m   Physical Exam HENT:     Head: Normocephalic and atraumatic.     Nose: Nose normal.     Mouth/Throat:     Mouth: Mucous membranes are moist.     Pharynx: Oropharynx is clear.  Eyes:     Extraocular Movements: Extraocular movements intact.     Conjunctiva/sclera: Conjunctivae normal.     Pupils: Pupils are equal, round, and reactive to light.  Cardiovascular:     Rate and Rhythm: Normal rate and regular rhythm.     Pulses: Normal pulses.     Heart sounds: Normal heart sounds.  Pulmonary:     Effort: Pulmonary effort is normal.     Breath sounds: Normal breath sounds.   Musculoskeletal:        General: Normal range of motion.     Cervical back: Normal range of motion and neck supple.  Neurological:     General: No focal deficit present.     Mental Status: She is alert and oriented to person, place, and time.  Psychiatric:        Mood and Affect: Mood normal.        Behavior: Behavior normal.     ASSESSMENT AND PLAN: 1. Uncontrolled hypertension (Primary) - Blood pressure not at goal during today's visit. Patient asymptomatic without chest pressure, chest pain, palpitations, shortness of breath, worst headache of life, and any additional red flag symptoms. - Resume Amlodipine as prescribed.  - Begin Valsartan as prescribed.  - Hydralazine administered in office with improvement of blood pressure from 199/96 to 159/72. - Routine screening.  - Counseled on blood pressure goal of less than 140/90, low-sodium, DASH diet, medication compliance, 150 minutes of moderate intensity exercise per week as tolerated. Discussed medication compliance, adverse effects. - Referral to Cardiology for evaluation/management.  - Follow-up in 2 weeks or sooner if needed with clinical pharmacist for blood pressure check. Write down your blood pressure readings each day and bring those results along with your home blood pressure monitor to your appointment. Medications may be adjusted at that time if needed. - amLODipine (NORVASC) 5 MG tablet; Take 1 tablet (5 mg total) by mouth daily.  Dispense: 90 tablet; Refill: 0 - valsartan (DIOVAN) 40 MG tablet; Take 1 tablet (40 mg total) by mouth daily.  Dispense: 30 tablet; Refill: 1 - Ambulatory referral to Cardiology - Basic Metabolic Panel - hydrALAZINE (APRESOLINE) tablet 50 mg  2. Hyperlipidemia, unspecified hyperlipidemia type - Resume Rosuvastatin as prescribed. Counseled on medication adherence/adverse effects.  - Routine screening.  - Referral to Cardiology for evaluation/management.  - Follow-up in 2 weeks or sooner if  needed with clinical pharmacist. - Ambulatory referral to Cardiology - Lipid panel - rosuvastatin (CRESTOR) 20 MG tablet; Take 1 tablet (20 mg total) by mouth daily.  Dispense: 90 tablet; Refill: 0    Patient was given the opportunity to ask questions.  Patient verbalized understanding of the plan and was able to repeat key elements of the plan. Patient was given clear instructions to go to Emergency Department or return to medical center if symptoms don't improve, worsen, or new problems develop.The patient verbalized understanding.   Orders Placed This Encounter  Procedures   Basic Metabolic Panel   Lipid panel   Ambulatory referral to Cardiology     Requested Prescriptions   Signed Prescriptions  Disp Refills   amLODipine (NORVASC) 5 MG tablet 90 tablet 0    Sig: Take 1 tablet (5 mg total) by mouth daily.   valsartan (DIOVAN) 40 MG tablet 30 tablet 1    Sig: Take 1 tablet (40 mg total) by mouth daily.   rosuvastatin (CRESTOR) 20 MG tablet 90 tablet 0    Sig: Take 1 tablet (20 mg total) by mouth daily.    Return in about 2 weeks (around 06/13/2023) for Follow-Up or next available chronic conditions with Georgiana Shore Ausdall RPH-CPP at [facility].  Rema Fendt, NP

## 2023-05-31 ENCOUNTER — Other Ambulatory Visit: Payer: Self-pay | Admitting: Family

## 2023-05-31 DIAGNOSIS — Z13228 Encounter for screening for other metabolic disorders: Secondary | ICD-10-CM

## 2023-05-31 DIAGNOSIS — E785 Hyperlipidemia, unspecified: Secondary | ICD-10-CM

## 2023-05-31 LAB — LIPID PANEL
Chol/HDL Ratio: 3.7 {ratio} (ref 0.0–4.4)
Cholesterol, Total: 288 mg/dL — ABNORMAL HIGH (ref 100–199)
HDL: 78 mg/dL (ref >39)
LDL Chol Calc (NIH): 165 mg/dL — ABNORMAL HIGH (ref 0–99)
Triglycerides: 248 mg/dL — ABNORMAL HIGH (ref 0–149)
VLDL Cholesterol Cal: 45 mg/dL — ABNORMAL HIGH (ref 5–40)

## 2023-05-31 LAB — BASIC METABOLIC PANEL
BUN/Creatinine Ratio: 18 (ref 12–28)
BUN: 17 mg/dL (ref 8–27)
CO2: 23 mmol/L (ref 20–29)
Calcium: 9.2 mg/dL (ref 8.7–10.3)
Chloride: 103 mmol/L (ref 96–106)
Creatinine, Ser: 0.97 mg/dL (ref 0.57–1.00)
Glucose: 90 mg/dL (ref 70–99)
Potassium: 3.2 mmol/L — ABNORMAL LOW (ref 3.5–5.2)
Sodium: 142 mmol/L (ref 134–144)
eGFR: 59 mL/min/{1.73_m2} — ABNORMAL LOW (ref >59)

## 2023-06-05 ENCOUNTER — Encounter: Payer: Self-pay | Admitting: *Deleted

## 2023-06-13 ENCOUNTER — Ambulatory Visit: Payer: 59 | Admitting: Pharmacist

## 2023-07-26 ENCOUNTER — Encounter: Payer: Self-pay | Admitting: Cardiovascular Disease

## 2023-08-22 ENCOUNTER — Other Ambulatory Visit: Payer: Self-pay | Admitting: Family

## 2023-08-22 DIAGNOSIS — I10 Essential (primary) hypertension: Secondary | ICD-10-CM

## 2023-08-22 DIAGNOSIS — E785 Hyperlipidemia, unspecified: Secondary | ICD-10-CM

## 2023-11-04 ENCOUNTER — Encounter (HOSPITAL_COMMUNITY): Payer: Self-pay | Admitting: Interventional Radiology

## 2023-12-05 ENCOUNTER — Emergency Department (HOSPITAL_COMMUNITY)
Admission: EM | Admit: 2023-12-05 | Discharge: 2023-12-06 | Disposition: A | Discharge status: Home / Self Care | Attending: Emergency Medicine | Admitting: Emergency Medicine

## 2023-12-05 ENCOUNTER — Other Ambulatory Visit: Payer: Self-pay

## 2023-12-05 ENCOUNTER — Encounter (HOSPITAL_COMMUNITY): Payer: Self-pay | Admitting: *Deleted

## 2023-12-05 ENCOUNTER — Emergency Department (HOSPITAL_COMMUNITY)

## 2023-12-05 DIAGNOSIS — Z79899 Other long term (current) drug therapy: Secondary | ICD-10-CM | POA: Diagnosis not present

## 2023-12-05 DIAGNOSIS — N390 Urinary tract infection, site not specified: Secondary | ICD-10-CM

## 2023-12-05 DIAGNOSIS — I10 Essential (primary) hypertension: Secondary | ICD-10-CM | POA: Insufficient documentation

## 2023-12-05 DIAGNOSIS — I723 Aneurysm of iliac artery: Secondary | ICD-10-CM | POA: Diagnosis not present

## 2023-12-05 DIAGNOSIS — Z7982 Long term (current) use of aspirin: Secondary | ICD-10-CM | POA: Diagnosis not present

## 2023-12-05 DIAGNOSIS — M5441 Lumbago with sciatica, right side: Secondary | ICD-10-CM | POA: Insufficient documentation

## 2023-12-05 DIAGNOSIS — M25551 Pain in right hip: Secondary | ICD-10-CM | POA: Diagnosis present

## 2023-12-05 DIAGNOSIS — I825Z1 Chronic embolism and thrombosis of unspecified deep veins of right distal lower extremity: Secondary | ICD-10-CM

## 2023-12-05 DIAGNOSIS — M79661 Pain in right lower leg: Secondary | ICD-10-CM | POA: Diagnosis not present

## 2023-12-05 DIAGNOSIS — M7989 Other specified soft tissue disorders: Secondary | ICD-10-CM | POA: Diagnosis not present

## 2023-12-05 DIAGNOSIS — I714 Abdominal aortic aneurysm, without rupture, unspecified: Secondary | ICD-10-CM | POA: Diagnosis not present

## 2023-12-05 DIAGNOSIS — D696 Thrombocytopenia, unspecified: Secondary | ICD-10-CM

## 2023-12-05 DIAGNOSIS — R319 Hematuria, unspecified: Secondary | ICD-10-CM

## 2023-12-05 LAB — URINALYSIS, ROUTINE W REFLEX MICROSCOPIC
Bilirubin Urine: NEGATIVE
Glucose, UA: NEGATIVE mg/dL
Ketones, ur: NEGATIVE mg/dL
Nitrite: NEGATIVE
Protein, ur: >=300 mg/dL — AB
Specific Gravity, Urine: 1.02 (ref 1.005–1.030)
WBC, UA: >50 WBC/hpf (ref 0–5)
pH: 5 (ref 5.0–8.0)

## 2023-12-05 LAB — CBC
HCT: 36.9 % (ref 36.0–46.0)
Hemoglobin: 11.6 g/dL — ABNORMAL LOW (ref 12.0–15.0)
MCH: 28.9 pg (ref 26.0–34.0)
MCHC: 31.4 g/dL (ref 30.0–36.0)
MCV: 92 fL (ref 80.0–100.0)
Platelets: 45 K/uL — ABNORMAL LOW (ref 150–400)
RBC: 4.01 MIL/uL (ref 3.87–5.11)
RDW: 13.4 % (ref 11.5–15.5)
WBC: 3.2 K/uL — ABNORMAL LOW (ref 4.0–10.5)
nRBC: 0 % (ref 0.0–0.2)

## 2023-12-05 LAB — COMPREHENSIVE METABOLIC PANEL WITH GFR
ALT: 9 U/L (ref 0–44)
AST: 19 U/L (ref 15–41)
Albumin: 3.6 g/dL (ref 3.5–5.0)
Alkaline Phosphatase: 69 U/L (ref 38–126)
Anion gap: 9 (ref 5–15)
BUN: 22 mg/dL (ref 8–23)
CO2: 28 mmol/L (ref 22–32)
Calcium: 9.2 mg/dL (ref 8.9–10.3)
Chloride: 103 mmol/L (ref 98–111)
Creatinine, Ser: 1.07 mg/dL — ABNORMAL HIGH (ref 0.44–1.00)
GFR, Estimated: 53 mL/min — ABNORMAL LOW (ref >60)
Glucose, Bld: 81 mg/dL (ref 70–99)
Potassium: 3 mmol/L — ABNORMAL LOW (ref 3.5–5.1)
Sodium: 140 mmol/L (ref 135–145)
Total Bilirubin: 0.8 mg/dL (ref 0.0–1.2)
Total Protein: 8.2 g/dL — ABNORMAL HIGH (ref 6.5–8.1)

## 2023-12-05 MED ORDER — HYDROCODONE-ACETAMINOPHEN 5-325 MG PO TABS: 1.0000 | ORAL_TABLET | Freq: Four times a day (QID) | ORAL | 0 refills | Status: DC | PRN

## 2023-12-05 MED ORDER — KETOROLAC TROMETHAMINE 15 MG/ML IJ SOLN
15.0000 mg | Freq: Once | INTRAMUSCULAR | Status: AC
  Administered 2023-12-05: 15 mg via INTRAVENOUS
  Filled 2023-12-05: qty 1

## 2023-12-05 MED ORDER — POTASSIUM CHLORIDE CRYS ER 20 MEQ PO TBCR
40.0000 meq | EXTENDED_RELEASE_TABLET | Freq: Once | ORAL | Status: AC
  Administered 2023-12-05: 40 meq via ORAL
  Filled 2023-12-05: qty 2

## 2023-12-05 MED ORDER — ONDANSETRON HCL 4 MG/2ML IJ SOLN
4.0000 mg | Freq: Once | INTRAMUSCULAR | Status: AC
  Administered 2023-12-05: 4 mg via INTRAVENOUS
  Filled 2023-12-05: qty 2

## 2023-12-05 MED ORDER — PREDNISONE 20 MG PO TABS
ORAL_TABLET | ORAL | 0 refills | Status: DC
Start: 2023-12-05 — End: 2023-12-15

## 2023-12-05 MED ORDER — CEPHALEXIN 500 MG PO CAPS: 500.0000 mg | ORAL_CAPSULE | Freq: Four times a day (QID) | ORAL | 0 refills | Status: DC

## 2023-12-05 MED ORDER — MORPHINE SULFATE (PF) 4 MG/ML IV SOLN
4.0000 mg | Freq: Once | INTRAVENOUS | Status: AC
  Administered 2023-12-05: 4 mg via INTRAVENOUS
  Filled 2023-12-05: qty 1

## 2023-12-05 MED ORDER — LIDOCAINE 5 % EX PTCH
1.0000 | MEDICATED_PATCH | CUTANEOUS | Status: DC
  Administered 2023-12-05: 1 via TRANSDERMAL
  Filled 2023-12-05: qty 1

## 2023-12-05 MED ORDER — HYDROCODONE-ACETAMINOPHEN 5-325 MG PO TABS
1.0000 | ORAL_TABLET | Freq: Once | ORAL | Status: AC
  Administered 2023-12-05: 1 via ORAL
  Filled 2023-12-05: qty 1

## 2023-12-05 NOTE — ED Notes (Signed)
 Reports pain was better, but CT made it worse again.

## 2023-12-05 NOTE — ED Notes (Signed)
 [facility] called

## 2023-12-05 NOTE — ED Provider Notes (Addendum)
 [place] EMERGENCY DEPARTMENT AT [facility] Provider Note   CSN: [id number] Arrival date & time: 12/05/23  1012     Patient presents with: Back Pain   [name] is a 78 y.o. female.   Patient with history of CVA, history of DVT, history of intra-abdominal aneurysm, hypertension (amlodipine , valsartan , hydralazine ), hyperlipidemia (rosuvastatin ) -- presents to the emergency department today for evaluation of right hip pain and right leg pain.  Symptoms started about 3 days ago.  She denies falls or injuries.  Pain shoots down her right leg.  It is worse when she bends.  No distal numbness or tingling.  She is able to ambulate.  EMS was called for transport today.  Patient does not know the names of the medications she should be taking.  She reports not being on any medications for several weeks.  She denies any anterior abdominal pain.  No chest pain or shortness of breath.  No weakness or numbness in the lower extremities. Patient denies warning symptoms of back pain including: fecal incontinence, urinary retention or overflow incontinence, night sweats, waking from sleep with back pain, unexplained fevers or weight loss, h/o cancer, IVDU, recent trauma.          Prior to Admission medications   Medication Sig Start Date End Date Taking? Authorizing Provider  amLODipine  (NORVASC ) 5 MG tablet TAKE 1 TABLET(5 MG) BY MOUTH DAILY 08/23/23   Lorren Greig PARAS, NP  aspirin  EC 81 MG EC tablet Take 1 tablet (81 mg total) by mouth daily. Swallow whole. Patient not taking: Reported on 05/30/2023 09/17/20   Vernon Ranks, MD  rosuvastatin  (CRESTOR ) 20 MG tablet TAKE 1 TABLET(20 MG) BY MOUTH DAILY 08/23/23   Lorren Greig PARAS, NP  valsartan  (DIOVAN ) 40 MG tablet Take 1 tablet (40 mg total) by mouth daily. 05/30/23   Lorren Greig PARAS, NP    Allergies: Patient has no known allergies.    Review of Systems  Updated Vital Signs BP (!) 136/112 (BP Location: Left Arm)   Pulse (!) 110   Temp  98.7 F (37.1 C) (Oral)   Resp 16   Wt 95.3 kg   SpO2 91%   BMI 30.13 kg/m   Physical Exam Vitals and nursing note reviewed.  Constitutional:      Appearance: She is well-developed.  HENT:     Head: Normocephalic and atraumatic.  Eyes:     Conjunctiva/sclera: Conjunctivae normal.  Cardiovascular:     Pulses: Normal pulses.          Dorsalis pedis pulses are 2+ on the right side and 2+ on the left side.  Pulmonary:     Effort: Pulmonary effort is normal.  Abdominal:     Palpations: Abdomen is soft.     Tenderness: There is no abdominal tenderness.  Musculoskeletal:        General: Normal range of motion.     Cervical back: Normal range of motion and neck supple.     Right lower leg: Edema present.     Left lower leg: No edema.     Comments: No step-off noted with palpation of spine.  Right lower extremity is swollen compared to the left lower extremity, patient states that this is chronic.  Skin:    General: Skin is warm and dry.     Findings: No rash.  Neurological:     Mental Status: She is alert.     Sensory: No sensory deficit.     Comments: 5/5 strength  in entire lower extremities bilaterally. No sensation deficit.      (all labs ordered are listed, but only abnormal results are displayed) Labs Reviewed  CBC  COMPREHENSIVE METABOLIC PANEL WITH GFR    EKG: None  Radiology: CT Lumbar Spine Wo Contrast Result Date: 12/05/2023 CLINICAL DATA:  Low back pain, increased fracture risk Myelopathy, acute, lumbar spine EXAM: CT LUMBAR SPINE WITHOUT CONTRAST TECHNIQUE: Multidetector CT imaging of the lumbar spine was performed without intravenous contrast administration. Multiplanar CT image reconstructions were also generated. RADIATION DOSE REDUCTION: This exam was performed according to the departmental dose-optimization program which includes automated exposure control, adjustment of the mA and/or kV according to patient size and/or use of iterative reconstruction  technique. COMPARISON:  None Available. FINDINGS: Segmentation: Normal. 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 Vertebrae: No fractures or osseous lesions. Paraspinal and other soft tissues: There is a fusiform infrarenal abdominal aorta aneurysm present, which measures up to of 4.3 x 4.2 cm in cross-sectional diameter. 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 The paraspinous soft tissues are otherwise unremarkable. Disc levels: L1-2: Normal. L2-3: Normal. L3-4: Broad-based disc bulging and bilateral facet hypertrophy, resulting in mild-to-moderate central spinal canal stenosis and bilateral lateral recess stenosis. L4-5: Broad-based disc bulging and moderate bilateral facet hypertrophy, causing moderate central spinal canal stenosis and moderate bilateral lateral recess stenosis. L5-S1: Disc space narrowing. Moderate bilateral facet arthrosis. No significant spinal canal or neural foraminal stenosis. IMPRESSION: 1. 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 2. Chronic degenerative disc disease at L3-4 with mild-to-moderate central spinal canal and bilateral lateral recess stenosis. 3. 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 Electronically Signed   By: Evalene Coho M.D.   On: 12/05/2023 12:33     Procedures   Medications Ordered in the ED  morphine  (PF) 4 MG/ML injection 4 mg (has no administration in time range)  ondansetron  (ZOFRAN ) injection 4 mg (has no administration in time range)   ED Course  Patient seen and examined. History obtained directly from patient.   Labs/EKG: Ordered CBC, CMP.  Imaging: Ordered CT lumbar spine, DVT study right lower extremity.  Medications/Fluids: Ordered: Morphine /Zofran .   Most recent vital signs  reviewed and are as follows: Wt 95.3 kg   SpO2 98%   BMI 30.13 kg/m   Initial impression: Patient's right hip pain with radiation in the right lower leg concerning for right lower extremity radiculopathy.  Patient does not have a history of the same.  She has a complicated past medical history.  She is noncompliant with her medications.  I do not think that she is supposed to be on anticoagulation from what I can tell.  She does have history of previous DVT, will evaluate for right lower extremity DVT.  Given no history of radiculopathy, will check lower back with CT.  Will check basic labs.  3:17 PM Patient discussed with Dr. Elnor who has seen.   Plan: F/u results DVT study, EKG, UA. DVT study in progress now.   CT of the lumbar spine shows mild degenerative disc disease, shows previously known aneurysm.  Likely home with pain meds/steroid taper, PCP/vascular follow-up.   Discussed plan with Minnie RIGGERS at shift change.   3:40 PM on reassessment, patient appears comfortable.  Awaiting DVT study results.  Rechecked lower extremities, well-perfused,  readily palpable pedal pulses bilaterally.                                   Medical Decision Making Amount and/or Complexity of Data Reviewed Labs: ordered. Radiology: ordered.  Risk Prescription drug management.   Patient with right hip pain with radiation down the right leg.  Normal distal circulation, motor, and sensation.  Symptoms suggestive of lumbar radiculopathy, however patient does have several comorbidities, DVT history, noncompliance with medication which complicates medical picture.  Evaluating currently for DVT.  Lumbar spine without any concerning findings.  Pulses intact distally.  No concern for ischemia or arterial insufficiency acutely.  Patient will need outpatient vascular follow-up.  If remainder of workup is reassuring, will provide with symptom control, steroid taper.  Strongly encouraged outpatient follow-up which  will be important.  No emergent indications for neurosurgery or other consultation.  The patient's vital signs, pertinent lab work and imaging were reviewed and interpreted as discussed in the ED course. Hospitalization was considered for further testing, treatments, or serial exams/observation. However as patient is well-appearing, has a stable exam, and reassuring studies today, I do not feel that they warrant admission at this time. This plan was discussed with the patient who verbalizes agreement and comfort with this plan and seems reliable and able to return to the Emergency Department with worsening or changing symptoms.       Final diagnoses:  Acute right-sided low back pain with right-sided sciatica  Aneurysm of right iliac artery (HCC)  Abdominal aortic aneurysm (AAA) without rupture, unspecified part (HCC)  Hypertension, unspecified type    ED Discharge Orders          Ordered    predniSONE  (DELTASONE ) 20 MG tablet        12/05/23 1527    HYDROcodone -acetaminophen  (NORCO/VICODIN) 5-325 MG tablet  Every 6 hours PRN        12/05/23 1527               Desiderio Chew, PA-C 12/05/23 1531    Desiderio Chew, PA-C 12/05/23 1540    Elnor Savant A, DO 12/11/23 [phone]

## 2023-12-05 NOTE — ED Notes (Signed)
 2nd IV established, blood sent, pending CMP

## 2023-12-05 NOTE — ED Provider Notes (Signed)
 Received patient in signout from previous provider pending DVT, EKG, UA, reassessment.  See his note.  In short, patient presents to Emergency Department for evaluation of right hip and right leg pain that started 3 days ago.  Pain radiates down right leg pain that worsens with bending. No hx of IVDU nor malignancy. No saddle paresthesia. Incontinent at baseline. Low suspicion for cauda equina. No unilateral weakness nor additional signs of CVA to suggest CVA/TIA  ED workup notable for potassium of 3 which she appears to be chronically in the 3-3.2 range and clearance of 45.  This is not per patient's baseline patient usually is 123-242  Previous EDP obtain DVT study as patient has a history of DVT but is not on any anticoagulation secondary to history of ICH.  Will also obtain UA to ensure no UTI, stones.  EKG obtained as patient's pulse increased to 110 following being within normal limits prior.  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 Will have her follow up with vascular regarding this. Unable to be on [facility] as she has hx of ICH  Stable for DC. DC with steroids, pain medicine for likely lumbar radiculopathy.  She can also follow-up with vascular surgery for history of aneurysms and further management  Obtained in and out as patient was unable to void for UA. UA notable for possible UTI. No contaminated. Will send culture and keflex   Discussed ED workup, disposition, return to ED precautions with patient who expresses understanding agrees with plan.  All questions answered to their satisfaction.  They are agreeable to plan.  Discharge instructions provided on paperwork   Minnie Tinnie BRAVO, PA 12/05/23 2219    Melvenia Motto, MD 12/05/23 [phone]

## 2023-12-05 NOTE — Discharge Instructions (Addendum)
 Please read and follow all provided instructions.  Your diagnoses today include:  1. Acute right-sided low back pain with right-sided sciatica   2. Aneurysm of right iliac artery (HCC)   3. Abdominal aortic aneurysm (AAA) without rupture, unspecified part (HCC)   4. Hypertension, unspecified type   5. Thrombopenia (HCC)     Tests performed today include: Vital signs - see below for your results today Complete blood cell count: Your platelet count was low and will need to be rechecked by your doctor Basic metabolic panel: Low potassium, chronic Urinalysis (urine test):  CT scan of your lumbar spine shows some arthritis and degenerative disc disease, also shows the aneurysm in your abdomen and right groin.  As we discussed, these will need to be closely followed as an outpatient by vascular surgery to make sure that they are not getting bigger.  Medications prescribed:  Prednisone  - steroid medicine   It is best to take this medication in the morning to prevent sleeping problems. If you are diabetic, monitor your blood sugar closely and stop taking Prednisone  if blood sugar is over 300. Take with food to prevent stomach upset.   Vicodin (hydrocodone /acetaminophen ) - narcotic pain medication  DO NOT drive or perform any activities that require you to be awake and alert because this medicine can make you drowsy. BE VERY CAREFUL not to take multiple medicines containing Tylenol  (also called acetaminophen ). Doing so can lead to an overdose which can damage your liver and cause liver failure and possibly death.  Keflex  - antibiotic for suspected UTI  Make sure to take as prescribed and do not miss a dose We have also sent a urine culture and will update you if we need to change your antibiotic  Take any prescribed medications only as directed.  Home care instructions:  Follow any educational materials contained in this packet Please rest, use ice or heat on your back for the next several  days Do not lift, push, pull anything more than 10 pounds for the next week  Follow-up instructions: Please follow-up with your primary care provider in the next 1 week for further evaluation of your symptoms.  Follow up with vascular surgery regarding chronic DVT and aneurysm    Return instructions:  SEEK IMMEDIATE MEDICAL ATTENTION IF YOU HAVE: New numbness, tingling, weakness, or problem with the use of your arms or legs Severe back pain not relieved with medications Loss control of your bowels or bladder Increasing pain in any areas of the body (such as chest or abdominal pain) Shortness of breath, dizziness, or fainting.  Worsening nausea (feeling sick to your stomach), vomiting, fever, or sweats Any other emergent concerns regarding your health   Additional Information:  Your vital signs today were: BP (!) 136/112 (BP Location: Left Arm)   Pulse (!) 110   Temp 98.7 F (37.1 C) (Oral)   Resp 16   Wt 95.3 kg   SpO2 91%   BMI 30.13 kg/m  If your blood pressure (BP) was elevated above 135/85 this visit, please have this repeated by your doctor within one month. --------------

## 2023-12-05 NOTE — ED Triage Notes (Signed)
 BIB [facility] from home for R lower back pain, radiates from hip to foot. Denies h/o same. Denies other sx. Denies fall or injury. Denies numbness, tingling, NVD, fever, or GYN/GU sx. Onset 3d ago. No relief with tylenol . VSS. 154/96, HR 120, RR 18, 97% RA. CBG 80. Alert, NAD, calm, interactive.

## 2023-12-05 NOTE — ED Notes (Signed)
 Pt not in room, pt in imaging. CBC in process. Pt needs venipuncture for CMP. CMP order cancelled by lab cancelled. Pending CMP draw.

## 2023-12-05 NOTE — ED Notes (Signed)
 Back from CT

## 2023-12-05 NOTE — ED Notes (Signed)
 Unable to collect UA due pt missing bed pan.

## 2023-12-10 ENCOUNTER — Emergency Department (HOSPITAL_COMMUNITY)

## 2023-12-10 ENCOUNTER — Inpatient Hospital Stay (HOSPITAL_COMMUNITY)
Admission: EM | Admit: 2023-12-10 | Discharge: 2023-12-15 | DRG: 812 | Disposition: A | Discharge status: Skilled Nursing Facility | Attending: Internal Medicine | Admitting: Internal Medicine

## 2023-12-10 ENCOUNTER — Encounter (HOSPITAL_COMMUNITY): Payer: Self-pay

## 2023-12-10 ENCOUNTER — Other Ambulatory Visit: Payer: Self-pay

## 2023-12-10 DIAGNOSIS — J449 Chronic obstructive pulmonary disease, unspecified: Secondary | ICD-10-CM | POA: Diagnosis present

## 2023-12-10 DIAGNOSIS — I10 Essential (primary) hypertension: Secondary | ICD-10-CM | POA: Diagnosis present

## 2023-12-10 DIAGNOSIS — Z79899 Other long term (current) drug therapy: Secondary | ICD-10-CM

## 2023-12-10 DIAGNOSIS — Z792 Long term (current) use of antibiotics: Secondary | ICD-10-CM

## 2023-12-10 DIAGNOSIS — K922 Gastrointestinal hemorrhage, unspecified: Secondary | ICD-10-CM | POA: Diagnosis not present

## 2023-12-10 DIAGNOSIS — Z8673 Personal history of transient ischemic attack (TIA), and cerebral infarction without residual deficits: Secondary | ICD-10-CM | POA: Diagnosis not present

## 2023-12-10 DIAGNOSIS — E538 Deficiency of other specified B group vitamins: Secondary | ICD-10-CM | POA: Diagnosis present

## 2023-12-10 DIAGNOSIS — R531 Weakness: Secondary | ICD-10-CM | POA: Diagnosis not present

## 2023-12-10 DIAGNOSIS — E876 Hypokalemia: Secondary | ICD-10-CM | POA: Diagnosis present

## 2023-12-10 DIAGNOSIS — Z7952 Long term (current) use of systemic steroids: Secondary | ICD-10-CM

## 2023-12-10 DIAGNOSIS — Z86718 Personal history of other venous thrombosis and embolism: Secondary | ICD-10-CM

## 2023-12-10 DIAGNOSIS — E785 Hyperlipidemia, unspecified: Secondary | ICD-10-CM | POA: Diagnosis present

## 2023-12-10 DIAGNOSIS — D696 Thrombocytopenia, unspecified: Secondary | ICD-10-CM | POA: Diagnosis present

## 2023-12-10 DIAGNOSIS — M549 Dorsalgia, unspecified: Secondary | ICD-10-CM | POA: Diagnosis present

## 2023-12-10 DIAGNOSIS — Z87891 Personal history of nicotine dependence: Secondary | ICD-10-CM

## 2023-12-10 DIAGNOSIS — D6489 Other specified anemias: Secondary | ICD-10-CM | POA: Diagnosis not present

## 2023-12-10 DIAGNOSIS — D649 Anemia, unspecified: Secondary | ICD-10-CM | POA: Diagnosis not present

## 2023-12-10 DIAGNOSIS — R296 Repeated falls: Secondary | ICD-10-CM | POA: Diagnosis not present

## 2023-12-10 DIAGNOSIS — Z751 Person awaiting admission to adequate facility elsewhere: Secondary | ICD-10-CM

## 2023-12-10 DIAGNOSIS — Z8249 Family history of ischemic heart disease and other diseases of the circulatory system: Secondary | ICD-10-CM

## 2023-12-10 DIAGNOSIS — I69354 Hemiplegia and hemiparesis following cerebral infarction affecting left non-dominant side: Secondary | ICD-10-CM

## 2023-12-10 DIAGNOSIS — R29810 Facial weakness: Secondary | ICD-10-CM | POA: Diagnosis present

## 2023-12-10 DIAGNOSIS — R609 Edema, unspecified: Secondary | ICD-10-CM | POA: Diagnosis present

## 2023-12-10 DIAGNOSIS — I7143 Infrarenal abdominal aortic aneurysm, without rupture: Secondary | ICD-10-CM | POA: Diagnosis present

## 2023-12-10 DIAGNOSIS — I739 Peripheral vascular disease, unspecified: Secondary | ICD-10-CM | POA: Diagnosis present

## 2023-12-10 LAB — I-STAT CHEM 8, ED
BUN: 39 mg/dL — ABNORMAL HIGH (ref 8–23)
BUN: 49 mg/dL — ABNORMAL HIGH (ref 8–23)
Calcium, Ion: 0.87 mmol/L — CL (ref 1.15–1.40)
Calcium, Ion: 1.15 mmol/L (ref 1.15–1.40)
Chloride: 104 mmol/L (ref 98–111)
Chloride: 106 mmol/L (ref 98–111)
Creatinine, Ser: 1 mg/dL (ref 0.44–1.00)
Creatinine, Ser: 1 mg/dL (ref 0.44–1.00)
Glucose, Bld: 90 mg/dL (ref 70–99)
Glucose, Bld: 92 mg/dL (ref 70–99)
HCT: 16 % — ABNORMAL LOW (ref 36.0–46.0)
HCT: 17 % — ABNORMAL LOW (ref 36.0–46.0)
Hemoglobin: 5.4 g/dL — CL (ref 12.0–15.0)
Hemoglobin: 5.8 g/dL — CL (ref 12.0–15.0)
Potassium: 3.2 mmol/L — ABNORMAL LOW (ref 3.5–5.1)
Potassium: 3.9 mmol/L (ref 3.5–5.1)
Sodium: 136 mmol/L (ref 135–145)
Sodium: 139 mmol/L (ref 135–145)
TCO2: 24 mmol/L (ref 22–32)
TCO2: 26 mmol/L (ref 22–32)

## 2023-12-10 LAB — CBC WITH DIFFERENTIAL/PLATELET
Abs Immature Granulocytes: 0.09 K/uL — ABNORMAL HIGH (ref 0.00–0.07)
Basophils Absolute: 0 K/uL (ref 0.0–0.1)
Basophils Relative: 0 %
Eosinophils Absolute: 0.2 K/uL (ref 0.0–0.5)
Eosinophils Relative: 2 %
HCT: 17.5 % — ABNORMAL LOW (ref 36.0–46.0)
Hemoglobin: 5.7 g/dL — CL (ref 12.0–15.0)
Immature Granulocytes: 1 %
Lymphocytes Relative: 22 %
Lymphs Abs: 1.6 K/uL (ref 0.7–4.0)
MCH: 30.5 pg (ref 26.0–34.0)
MCHC: 32.6 g/dL (ref 30.0–36.0)
MCV: 93.6 fL (ref 80.0–100.0)
Monocytes Absolute: 0.4 K/uL (ref 0.1–1.0)
Monocytes Relative: 6 %
Neutro Abs: 5.1 K/uL (ref 1.7–7.7)
Neutrophils Relative %: 69 %
Platelets: 78 K/uL — ABNORMAL LOW (ref 150–400)
RBC: 1.87 MIL/uL — ABNORMAL LOW (ref 3.87–5.11)
RDW: 14 % (ref 11.5–15.5)
Smear Review: NORMAL
WBC: 7.5 K/uL (ref 4.0–10.5)
nRBC: 0.9 % — ABNORMAL HIGH (ref 0.0–0.2)

## 2023-12-10 LAB — PREPARE RBC (CROSSMATCH)

## 2023-12-10 LAB — PROTIME-INR
INR: 1.4 — ABNORMAL HIGH (ref 0.8–1.2)
Prothrombin Time: 18.3 s — ABNORMAL HIGH (ref 11.4–15.2)

## 2023-12-10 LAB — COMPREHENSIVE METABOLIC PANEL WITH GFR
ALT: 17 U/L (ref 0–44)
AST: 36 U/L (ref 15–41)
Albumin: 3.4 g/dL — ABNORMAL LOW (ref 3.5–5.0)
Alkaline Phosphatase: 40 U/L (ref 38–126)
Anion gap: 6 (ref 5–15)
BUN: 39 mg/dL — ABNORMAL HIGH (ref 8–23)
CO2: 26 mmol/L (ref 22–32)
Calcium: 8.7 mg/dL — ABNORMAL LOW (ref 8.9–10.3)
Chloride: 104 mmol/L (ref 98–111)
Creatinine, Ser: 0.96 mg/dL (ref 0.44–1.00)
GFR, Estimated: >60 mL/min (ref >60)
Glucose, Bld: 87 mg/dL (ref 70–99)
Potassium: 3.1 mmol/L — ABNORMAL LOW (ref 3.5–5.1)
Sodium: 136 mmol/L (ref 135–145)
Total Bilirubin: 0.8 mg/dL (ref 0.0–1.2)
Total Protein: 5.1 g/dL — ABNORMAL LOW (ref 6.5–8.1)

## 2023-12-10 LAB — POC OCCULT BLOOD, ED: Fecal Occult Bld: POSITIVE — AB

## 2023-12-10 LAB — MAGNESIUM: Magnesium: 2 mg/dL (ref 1.7–2.4)

## 2023-12-10 LAB — CK: Total CK: 762 U/L — ABNORMAL HIGH (ref 38–234)

## 2023-12-10 MED ORDER — IOHEXOL 350 MG/ML SOLN
75.0000 mL | Freq: Once | INTRAVENOUS | Status: AC | PRN
  Administered 2023-12-10: 75 mL via INTRAVENOUS

## 2023-12-10 MED ORDER — SODIUM CHLORIDE 0.9% IV SOLUTION: Freq: Once | INTRAVENOUS | Status: AC

## 2023-12-10 MED ORDER — POTASSIUM CHLORIDE 10 MEQ/100ML IV SOLN
10.0000 meq | INTRAVENOUS | Status: AC
  Administered 2023-12-10 (×4): 10 meq via INTRAVENOUS
  Filled 2023-12-10 (×4): qty 100

## 2023-12-10 MED ORDER — ACETAMINOPHEN 650 MG RE SUPP: 650.0000 mg | Freq: Four times a day (QID) | RECTAL | Status: DC | PRN

## 2023-12-10 MED ORDER — SODIUM CHLORIDE 0.9% FLUSH
3.0000 mL | Freq: Two times a day (BID) | INTRAVENOUS | Status: DC
  Administered 2023-12-10 – 2023-12-15 (×11): 3 mL via INTRAVENOUS

## 2023-12-10 MED ORDER — SODIUM CHLORIDE 0.9 % IV BOLUS
500.0000 mL | Freq: Once | INTRAVENOUS | Status: AC
  Administered 2023-12-10: 500 mL via INTRAVENOUS

## 2023-12-10 MED ORDER — ROSUVASTATIN CALCIUM 20 MG PO TABS
20.0000 mg | ORAL_TABLET | Freq: Every day | ORAL | Status: DC
  Administered 2023-12-11 – 2023-12-15 (×5): 20 mg via ORAL
  Filled 2023-12-10 (×5): qty 1

## 2023-12-10 MED ORDER — ACETAMINOPHEN 325 MG PO TABS
650.0000 mg | ORAL_TABLET | Freq: Four times a day (QID) | ORAL | Status: DC | PRN
  Administered 2023-12-11 – 2023-12-15 (×4): 650 mg via ORAL
  Filled 2023-12-10 (×4): qty 2

## 2023-12-10 NOTE — Consult Note (Signed)
 Consultation  Referring Provider: [facility] Primary Care Physician:  Pcp, No Primary Gastroenterologist: unassigned Reason for Consultation:  anemia with large drop in hgb past week ,concern for Gi bleed  HPI: [name] is a 78 y.o. female with history of hypertension, COPD, prior history of DVT, history of CVA with left-sided weakness, prior history of intracerebral hemorrhage show not on anticoagulation just baby aspirin . She had been seen in the ER on 12/05/2023 for back pain, and leg pain there was concern for lumbar radiculopathy and she had been given a course of prednisone  and Norco.  Apparently also felt to have UTI and had been given a prescription for Keflex  which it appears that she had not taken.  He was brought to the ER today after having multiple falls at home over the past couple of days.  When she was evaluated by EMS they noted that the Keflex  bottle was full and the hydrocodone  bottle was empty.  Patient with general complaint of weakness.  Has been hemodynamically stable, workup in the emergency room with labs today showing WBC 7.5/hemoglobin 5.7/hematocrit 17.5/MCV 93.6/platelets 78  Of note labs from 12/05/2023 with WBC 3.2/hemoglobin 11.6/hematocrit 36/MCV 92 and platelets 45 Pro time 18.3/INR 1.4 Sodium 136/potassium 3.1/BUN 39/creatinine 0.96 Albumin 3.4 LFTs within normal limits CK7 62 Stool was hemocculted and returned heme positive, no rectal exam done  Reviewing her prior labs she had hemoglobin of 11.1 in 2022, and on a series of labs had mild thrombocytopenia in the 140 range.  Patient says she feels sore all over from falling, and is having pain more so in the right hip than the left.  She denies any melena or hematochezia and says that she does pay attention to her bowels.  Has not had any diarrhea or loose stools over the past week she has no complaints of abdominal pain or discomfort, no recent nausea or vomiting no hematemesis, no epistaxis, no  dysphagia or odynophagia.  Appetite has been okay She takes baby aspirin  only at home, no other thinners  She does not believe that she has ever had an endoscopy or colonoscopy.  CT of the chest abdomen pelvis today-no focal consolidation or pulmonary edema, no pleural effusion or pneumothorax, chronic bandlike scarring in the posterior medial left lower lobe 1 nodule in the posterior left lung base, liver unremarkable, spleen normal.  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 There is asymmetric soft tissue edema and subcutaneous fat stranding overlying the lateral and anterior left hip oxymel left lower extremity with overlying skin thickening, and mild subcutaneous soft tissue stranding and skin thickening over the right hip. 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     Past Medical History:  Diagnosis Date   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 (HCC) 09/16/2020   COPD (chronic obstructive pulmonary disease) (HCC)    History of DVT (deep vein thrombosis)    Hypertension    Thoracoabdominal aortic aneurysm (HCC)    TIA (transient ischemic attack) 09/10/2020    Past Surgical History:  Procedure Laterality Date   IR CT HEAD LTD  09/11/2020   IR INTRAVSC STENT CERV CAROTID W/O EMB-PROT MOD SED INC ANGIO  09/11/2020   IR PERCUTANEOUS ART THROMBECTOMY/INFUSION INTRACRANIAL INC DIAG ANGIO  09/11/2020       IR PERCUTANEOUS ART THROMBECTOMY/INFUSION INTRACRANIAL INC DIAG ANGIO  09/11/2020   IR US  GUIDE VASC ACCESS RIGHT  09/11/2020   NO PAST SURGERIES     RADIOLOGY WITH ANESTHESIA N/A  09/11/2020   Procedure: IR WITH ANESTHESIA;  Surgeon: Dolphus Carrion, MD;  Location: [facility] OR;  Service: Radiology;  Laterality: N/A;    Prior to Admission medications   Medication Sig Start Date End Date Taking? Authorizing Provider  amLODipine  (NORVASC ) 5 MG tablet TAKE 1 TABLET(5 MG) BY MOUTH DAILY 08/23/23   Lorren Greig PARAS, NP  aspirin  EC 81 MG EC tablet Take 1 tablet (81 mg total) by mouth daily. Swallow whole. Patient not taking: Reported on 05/30/2023 09/17/20   Vernon Ranks, MD  cephALEXin  (KEFLEX ) 500 MG capsule Take 1 capsule (500 mg total) by mouth 4 (four) times daily. 12/05/23   Minnie Tinnie BRAVO, PA  HYDROcodone -acetaminophen  (NORCO/VICODIN) 5-325 MG tablet Take 1 tablet by mouth every 6 (six) hours as needed for severe pain (pain score 7-10). 12/05/23   Desiderio Chew, PA-C  predniSONE  (DELTASONE ) 20 MG tablet 3 Tabs PO Days 1-3, then 2 tabs PO Days 4-6, then 1 tab PO Day 7-9, then Half Tab PO Day 10-12 12/05/23   Desiderio Chew, PA-C  rosuvastatin  (CRESTOR ) 20 MG tablet TAKE 1 TABLET(20 MG) BY MOUTH DAILY 08/23/23   Lorren Greig PARAS, NP  valsartan  (DIOVAN ) 40 MG tablet Take 1 tablet (40 mg total) by mouth daily. 05/30/23   Lorren Greig PARAS, NP    Current Facility-Administered Medications  Medication Dose Route Frequency Provider Last Rate Last Admin   0.9 %  sodium chloride  infusion (Manually program via Guardrails IV Fluids)   Intravenous Once Seena Marsa NOVAK, MD       acetaminophen  (TYLENOL ) tablet 650 mg  650 mg Oral Q6H PRN Seena Marsa NOVAK, MD       Or   acetaminophen  (TYLENOL ) suppository 650 mg  650 mg Rectal Q6H PRN Seena Marsa NOVAK, MD       potassium chloride  10 mEq in 100 mL IVPB  10 mEq Intravenous Q1 Hr x 4 Melvin, Alexander B, MD       [START ON 12/11/2023] rosuvastatin  (CRESTOR ) tablet 20 mg  20 mg Oral Daily Melvin, Alexander B, MD       sodium chloride  flush (NS) 0.9 % injection 3 mL  3 mL Intravenous Q12H Seena Marsa NOVAK, MD        Allergies as of  12/10/2023   (No Known Allergies)    Family History  Problem Relation Age of Onset   Hypertension Mother     Social History   Socioeconomic History   Marital status: Single    Spouse name: Not on file   Number of children: Not on file   Years of education: Not on file   Highest education level: Not on file  Occupational History   Not on file  Tobacco Use   Smoking status: Former    Current packs/day: 0.00    Types: Cigarettes    Quit date: 05/10/1987    Years since quitting: 36.6   Smokeless tobacco: Never  Vaping Use   Vaping status: Never Used  Substance and Sexual Activity   Alcohol use: Not Currently   Drug use: Never   Sexual activity: Not Currently    Birth control/protection: None  Other Topics Concern   Not on file  Social History Narrative   Not on file   Social Drivers of Health   Financial Resource Strain: Not on file  Food Insecurity: Not on file  Transportation Needs: Not on file  Physical Activity: Not on file  Stress: Not on file  Social Connections: Not on file  Intimate Partner Violence: Not on file    Review of Systems: Pertinent positive and negative review of systems were noted in the above HPI section.  All other review of systems was otherwise negative.   Physical Exam: Vital signs in last 24 hours: Temp:  [98.4 F (36.9 C)-98.5 F (36.9 C)] 98.4 F (36.9 C) (08/03 1300) Pulse Rate:  [57-71] 71 (08/03 1315) Resp:  [12-17] 16 (08/03 1315) BP: (156-173)/(59-97) 167/97 (08/03 1315) SpO2:  [100 %] 100 % (08/03 1315) Weight:  [87.1 kg] 87.1 kg (08/03 0930)   General:   Alert,  Well-developed, well-nourished, elderly African-American female pleasant and cooperative in NAD Head:  Normocephalic and atraumatic. Eyes:  Sclera clear, no icterus.   Conjunctiva pink. Ears:  Normal auditory acuity. Nose:  No deformity, discharge,  or lesions. Mouth:  No deformity or lesions.   Neck:  Supple; no masses or thyromegaly. Lungs:  Clear  throughout to auscultation.   No wheezes, crackles, or rhonchi.  Heart:  Regular rate and rhythm; no murmurs, clicks, rubs,  or gallops. Abdomen:  Soft,nontender, BS active,nonpalp mass or hsm.   Rectal: Light brown stool, heme-negative with my Hemoccult Msk:  Symmetrical without gross deformities. . Pulses:  Normal pulses noted. Extremities: Scattered ecchymoses upper extremities and lower extremities, large ecchymoses over the left hip, firm, and much smaller ecchymoses on the right hip Neurologic:  Alert and  oriented x4; left-sided weakness Skin:  Intact without significant lesions or rashes.. Psych:  Alert and cooperative. Normal mood and affect.  Intake/Output from previous day: No intake/output data recorded. Intake/Output this shift: No intake/output data recorded.  Lab Results: Recent Labs    12/10/23 1151 12/10/23 1200 12/10/23 1207  WBC  --  7.5  --   HGB 5.4* 5.7* 5.8*  HCT 16.0* 17.5* 17.0*  PLT  --  78*  --    BMET Recent Labs    12/10/23 1151 12/10/23 1200 12/10/23 1207  NA 136 136 139  K 3.9 3.1* 3.2*  CL 106 104 104  CO2  --  26  --   GLUCOSE 92 87 90  BUN 49* 39* 39*  CREATININE 1.00 0.96 1.00  CALCIUM   --  8.7*  --    LFT Recent Labs    12/10/23 1200  PROT 5.1*  ALBUMIN 3.4*  AST 36  ALT 17  ALKPHOS 40  BILITOT 0.8   PT/INR Recent Labs    12/10/23 1200  LABPROT 18.3*  INR 1.4*   Hepatitis Panel No results for input(s): HEPBSAG, HCVAB, HEPAIGM, HEPBIGM in the last 72 hours.   IMPRESSION:  #30 78 year old African-American female brought to the ER after multiple falls at home over the past 5 to 6 days, scattered ecchymoses complaints of weakness Had been seen in the ER 4 days ago with complaints of back and leg pain and had been given an rx for prednisone  and Norco Also been given Keflex  for possible UTI.  (Per EMS the Keflex  bottle was full and the Norco bottle empty.  On evaluation in the ER hemodynamically stable. Labs  show profound anemia with hemoglobin 5.7 down from hemoglobin of 11.6 on 12/05/2023  He also has a significant thrombocytopenia with  platelet count of 78, and had platelet count of 45 ,4 days ago.  She has not noted any melena or hematochezia, has been having normal bowel movements, she has not had any nausea vomiting hematemesis epistaxis etc. at home, has been on baby aspirin  only.  CT chest abdomen and pelvis, no evidence of retroperitoneal bleed, she does have abdominal aortic aneurysm at 4.7 x 4.4 x 3.1 cm, and significant soft tissue edema primarily of the left hip distal with contusion  Stool was Hemoccult positive by the ER, Rectal exam by me with brown very normal-appearing stool heme-negative  I do not think she has had a major GI bleed, as there should be evidence of more overt blood loss for 5+ gram drop in hemoglobin She may have lost some blood into the left hip but no huge hematoma identified on the CT.  Suspect she has an underlying hematologic process/myelo dysplastic process with both again anemia and thrombocytopenia.  #2 previous CVA with left-sided weakness #3.  COPD #4.  Prior history of DVT #5.  Prior history of intracranial hemorrhage #6 elevated CK -not unexpected after multiple falls  Plan; Needs hematologic workup No plans for endoscopic evaluation from GI perspective at this time until she has had further hematologic evaluation. Okay for regular diet Heme transfused 2 units of packed RBCs  GI will follow along    Idell Hissong PA-C 12/10/2023, 4:18 PM

## 2023-12-10 NOTE — ED Notes (Signed)
 Pt provided with sack lunch and cup of water with PA ok

## 2023-12-10 NOTE — Plan of Care (Signed)
   Problem: Clinical Measurements: Goal: Will remain free from infection Outcome: Progressing Goal: Diagnostic test results will improve Outcome: Progressing

## 2023-12-10 NOTE — ED Triage Notes (Addendum)
 Pt arrived by EMS from home. Reports that she has had multi falls over past 1 1/2 weeks. Denies hitting head. No anticoagulant use. Pt has positive recent history of UTI dx, but keflex  bottle found in home was still full. Hydro/acet was empty. Pt c/o increased weakness and loss of independence. Lives at home with daughter and other family members. Pt w/hx CVA + L sided weakness. Presents with multiple bruising over entire body predominantly on L hip

## 2023-12-10 NOTE — H&P (Signed)
 History and Physical   Elzabeth Mack FMW:[id number] DOB: Sep 10, 1945 DOA: 12/10/2023  PCP: Pcp, No   Patient coming from: Home  Chief Complaint: Falls  HPI: [name] is a 78 y.o. female with medical history significant of hypertension, hyperlipidemia, CVA, TIA, COPD, DVT, intraventricular hemorrhage presenting after multiple falls at home.  Patient has been following 4-5 times a day for the past couple days.  Denies hitting her head and not on any anticoagulation.  Patient was seen in the ED on 7/29 for pain.  Workup showed chronic PVD without acute DVT, not on anticoagulation due to history of intracerebral hemorrhage.  Concern for lumbar radiculopathy for which she was prescribed prednisone  and Norco.  Urinalysis showed hemoglobin, protein, leukocytes, bacteria.  Patient was prescribed Keflex  for possible UTI and urine culture was planned but does not appear to have been ordered.  Has been reported, apparently by EMS that patient's Keflex  bottle was full at the home but hydrocodone  bottle was empty.  Patient is reporting increased weakness in the setting of baseline left-sided weakness and left facial droop from prior stroke that she feels may be contributing to the above falls.  Denies fevers, chills, chest pain, shortness of breath, abdominal pain, constipation, diarrhea, nausea, vomiting.  While in the ED noted to have new acute anemia prompting additional workup.  No reported dark or bloody stools; but did note some blood in her bed at one point.  ED Course: Vital signs in the ED notable for blood pressure in the 150s-170 systolic, heart rate in the 50s-70s.  Lab workup included CMP with potassium 3.1, BUN 39, calcium  8.7, protein 5.1, albumin 3.4.  CBC with hemoglobin 5.7 which is down from 11.65 days ago, platelets 78.  PT 18.3, INR 1.4.  FOBT positive.  CK7 62.  Urinalysis pending.  Type and screen performed.  Magnesium  normal.  Left shoulder x-ray showed no acute abnormality, left  forearm x-ray showed no acute abnormality, left hand x-ray showed no acute abnormality, pelvis x-ray showed no acute abnormality, left femur x-ray showed no acute abnormality.  CT head showed no acute abnormality but did show progressive chronic ischemic disease and progressed sinus disease.  CT C-spine showed no acute abnormality but did showed DISH.  CT of the chest abdomen pelvis showed no acute abnormality but did show infrarenal aortic aneurysm measuring 4.4 x 4.4 x 3.1 with recommendation for 1 year follow-up and vascular surgery referral.  Also noted was a pleural nodule with recommendation for 3 to 39-month follow-up.  Soft tissue edema and fat stranding at the left hip was noted possibly representing soft tissue contusion versus anasarca versus other.  Patient has been ordered 2 units PRBCs and 500 cc IV fluid.  GI also consulted.  Review of Systems: As per HPI otherwise all other systems reviewed and are negative.  Past Medical History:  Diagnosis Date   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 (HCC) 09/16/2020   COPD (chronic obstructive pulmonary disease) (HCC)    History of DVT (deep vein thrombosis)    Hypertension    Thoracoabdominal aortic aneurysm (HCC)    TIA (transient ischemic attack) 09/10/2020    Past Surgical History:  Procedure Laterality Date   IR CT HEAD LTD  09/11/2020   IR INTRAVSC STENT CERV CAROTID W/O EMB-PROT MOD SED INC ANGIO  09/11/2020   IR PERCUTANEOUS ART THROMBECTOMY/INFUSION INTRACRANIAL INC DIAG ANGIO  09/11/2020       IR PERCUTANEOUS ART THROMBECTOMY/INFUSION INTRACRANIAL INC DIAG ANGIO  09/11/2020   IR US  GUIDE VASC ACCESS RIGHT  09/11/2020   NO PAST SURGERIES     RADIOLOGY WITH ANESTHESIA N/A 09/11/2020   Procedure: IR WITH ANESTHESIA;  Surgeon: Dolphus Carrion, MD;  Location: [facility] OR;  Service: Radiology;  Laterality: N/A;    Social History  reports that she quit smoking about 36 years ago. Her smoking use  included cigarettes. She has never used smokeless tobacco. She reports that she does not currently use alcohol. She reports that she does not use drugs.  No Known Allergies  Family History  Problem Relation Age of Onset   Hypertension Mother   Reviewed on admission  Prior to Admission medications   Medication Sig Start Date End Date Taking? Authorizing Provider  amLODipine  (NORVASC ) 5 MG tablet TAKE 1 TABLET(5 MG) BY MOUTH DAILY 08/23/23   Lorren Greig PARAS, NP  aspirin  EC 81 MG EC tablet Take 1 tablet (81 mg total) by mouth daily. Swallow whole. Patient not taking: Reported on 05/30/2023 09/17/20   Vernon Ranks, MD  cephALEXin  (KEFLEX ) 500 MG capsule Take 1 capsule (500 mg total) by mouth 4 (four) times daily. 12/05/23   Minnie Tinnie BRAVO, PA  HYDROcodone -acetaminophen  (NORCO/VICODIN) 5-325 MG tablet Take 1 tablet by mouth every 6 (six) hours as needed for severe pain (pain score 7-10). 12/05/23   Geiple, Joshua, PA-C  predniSONE  (DELTASONE ) 20 MG tablet 3 Tabs PO Days 1-3, then 2 tabs PO Days 4-6, then 1 tab PO Day 7-9, then Half Tab PO Day 10-12 12/05/23   Desiderio Chew, PA-C  rosuvastatin  (CRESTOR ) 20 MG tablet TAKE 1 TABLET(20 MG) BY MOUTH DAILY 08/23/23   Lorren Greig PARAS, NP  valsartan  (DIOVAN ) 40 MG tablet Take 1 tablet (40 mg total) by mouth daily. 05/30/23   Lorren Greig PARAS, NP    Physical Exam: Vitals:   12/10/23 0930 12/10/23 1230 12/10/23 1300 12/10/23 1315  BP:  (!) 173/59  (!) 167/97  Pulse:  (!) 57  71  Resp:  12  16  Temp:   98.4 F (36.9 C)   TempSrc:      SpO2:  100%  100%  Weight: 87.1 kg     Height: 5' 10 (1.778 m)       Physical Exam Constitutional:      General: She is not in acute distress. HENT:     Head: Normocephalic and atraumatic.     Mouth/Throat:     Mouth: Mucous membranes are moist.     Pharynx: Oropharynx is clear.  Eyes:     Extraocular Movements: Extraocular movements intact.     Pupils: Pupils are equal, round, and reactive to light.   Cardiovascular:     Rate and Rhythm: Normal rate and regular rhythm.     Pulses: Normal pulses.     Heart sounds: Normal heart sounds.  Pulmonary:     Effort: Pulmonary effort is normal. No respiratory distress.     Breath sounds: Normal breath sounds.  Abdominal:     General: Bowel sounds are normal. There is no distension.     Palpations: Abdomen is soft.     Tenderness: There is no abdominal tenderness.  Musculoskeletal:        General: No swelling or deformity.  Skin:    General: Skin is warm and dry.  Neurological:     General: No focal deficit present.     Mental Status: Mental status is at baseline.   Falls Labs on Admission: I have personally reviewed  following labs and imaging studies  CBC: Recent Labs  Lab 12/05/23 1104 12/10/23 1151 12/10/23 1200 12/10/23 1207  WBC 3.2*  --  7.5  --   NEUTROABS  --   --  5.1  --   HGB 11.6* 5.4* 5.7* 5.8*  HCT 36.9 16.0* 17.5* 17.0*  MCV 92.0  --  93.6  --   PLT 45*  --  78*  --     Basic Metabolic Panel: Recent Labs  Lab 12/05/23 1306 12/10/23 1151 12/10/23 1200 12/10/23 1207  NA 140 136 136 139  K 3.0* 3.9 3.1* 3.2*  CL 103 106 104 104  CO2 28  --  26  --   GLUCOSE 81 92 87 90  BUN 22 49* 39* 39*  CREATININE 1.07* 1.00 0.96 1.00  CALCIUM  9.2  --  8.7*  --   MG  --   --  2.0  --     GFR: Estimated Creatinine Clearance: 54.7 mL/min (by C-G formula based on SCr of 1 mg/dL).  Liver Function Tests: Recent Labs  Lab 12/05/23 1306 12/10/23 1200  AST 19 36  ALT 9 17  ALKPHOS 69 40  BILITOT 0.8 0.8  PROT 8.2* 5.1*  ALBUMIN 3.6 3.4*    Urine analysis:    Component Value Date/Time   COLORURINE YELLOW 12/05/2023 1525   APPEARANCEUR CLOUDY (A) 12/05/2023 1525   LABSPEC 1.020 12/05/2023 1525   PHURINE 5.0 12/05/2023 1525   GLUCOSEU NEGATIVE 12/05/2023 1525   HGBUR LARGE (A) 12/05/2023 1525   BILIRUBINUR NEGATIVE 12/05/2023 1525   KETONESUR NEGATIVE 12/05/2023 1525   PROTEINUR >=300 (A) 12/05/2023 1525    UROBILINOGEN 1.0 11/15/2010 2015   NITRITE NEGATIVE 12/05/2023 1525   LEUKOCYTESUR TRACE (A) 12/05/2023 1525    Radiological Exams on Admission: CT CHEST ABDOMEN PELVIS W CONTRAST Result Date: 12/10/2023 EXAM: CT CHEST, ABDOMEN AND PELVIS WITH CONTRAST 12/10/2023 01:43:26 PM TECHNIQUE: CT of the chest, abdomen and pelvis was performed with the administration of intravenous contrast. Multiplanar reformatted images are provided for review. Automated exposure control, iterative reconstruction, and/or weight based adjustment of the mA/kV was utilized to reduce the radiation dose to as low as reasonably achievable. COMPARISON: 11/16/2010 CLINICAL HISTORY: Polytrauma, blunt; Multiple falls, pain. Triage notes; Pt arrived by EMS from home. Reports that she has had multi falls over past 1 1/2 weeks. Denies hitting head. No anticoagulant use. Pt has positive recent history of UTI dx, but keflex  bottle found in home was still full. Hydro/acet was empty. Pt c/o ; increased weakness and loss of independence. Lives at home with daughter and other family members. Pt w/hx CVA + L sided weakness. Presents with multiple bruising over entire body predominantly on L hip. FINDINGS: CHEST: MEDIASTINUM: Heart and pericardium are unremarkable. The central airways are clear. Small hiatal hernia. THORACIC LYMPH NODES: No mediastinal, hilar or axillary lymphadenopathy. LUNGS AND PLEURA: No focal consolidation or pulmonary edema. No pleural effusion or pneumothorax. Pleural calcifications are identified overlying the left lung, unchanged from the previous exam. Chronic band-like area of scarring is noted within the posterior and medial left lower lobe. Subpleural nodule in the posterior left lung base measures 0.9 x 0.6 cm, new from previous exam. ABDOMEN AND PELVIS: LIVER: The liver is unremarkable. GALLBLADDER AND BILE DUCTS: Gallbladder is unremarkable. No biliary ductal dilatation. SPLEEN: No acute abnormality. PANCREAS: No acute  abnormality. ADRENAL GLANDS: No acute abnormality. KIDNEYS, URETERS AND BLADDER: No stones in the kidneys or ureters. No hydronephrosis. No perinephric or  periureteral stranding. Urinary bladder is unremarkable. GI AND BOWEL: Stomach demonstrates no acute abnormality. There is no bowel obstruction. The appendix is visualized and appears normal. REPRODUCTIVE ORGANS: No acute abnormality. PERITONEUM AND RETROPERITONEUM: No ascites. No free air. VASCULATURE: Aortic atherosclerotic calcification. Focal penetrating atherosclerotic ulceration noted along the undersurface of the aortic arch measuring 1.7 x 0.8 cm, sagittal image 89/7. Coronary artery calcifications. The infrarenal abdominal aorta measures 4.4 x 4.4 cm. 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 ABDOMINAL AND PELVIS LYMPH NODES: No lymphadenopathy. BONES AND SOFT TISSUES: Thoracic degenerative disc disease. There is asymmetric soft tissue edema and subcutaneous fat stranding within the soft tissues overlying the lateral aspect and anterior aspect of the left hip and image portions of the proximal left lower extremity. Overlying skin thickening noted. Mild subcutaneous soft tissue stranding and skin thickening overlies the lateral aspect right hip. IMPRESSION: 1. No acute findings in the chest, abdomen, and pelvis related to the polytrauma and multiple falls. 2. 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 Follow up in 12 months and referral to vascular surgery. 3. 9x6 mm subpleural nodule within the left base appears smoothly marginated and may represent a benign intrapulmonary lymph node. Initial follow-up examination in 3 to 6 months is recommended. 4. Asymmetric soft tissue edema and subcutaneous fat stranding overlying the lateral and anterior left hip and proximal left lower extremity, with overlying skin thickening. Mild  subcutaneous soft tissue stranding and skin thickening overlies the lateral aspect of the right hip. These findings may reflect superficial soft tissue contusion, or anasarca. Clinical correlation advised. Electronically signed by: Waddell Calk MD 12/10/2023 02:04 PM EDT RP Workstation: [id number]   CT Cervical Spine Wo Contrast Result Date: 12/10/2023 CLINICAL DATA:  79 year old female with multiple falls recently. Increased weakness. EXAM: CT CERVICAL SPINE WITHOUT CONTRAST TECHNIQUE: Multidetector CT imaging of the cervical spine was performed without intravenous contrast. Multiplanar CT image reconstructions were also generated. RADIATION DOSE REDUCTION: This exam was performed according to the departmental dose-optimization program which includes automated exposure control, adjustment of the mA and/or kV according to patient size and/or use of iterative reconstruction technique. COMPARISON:  Head CT today.  CTA head and neck 09/11/2020. FINDINGS: Alignment: Stable straightening of lordosis. Cervicothoracic junction alignment is within normal limits. Bilateral posterior element alignment is within normal limits. Skull base and vertebrae: Bone mineralization is within normal limits for age. Visualized skull base is intact. No atlanto-occipital dissociation. C1 and C2 appear intact and aligned. No acute osseous abnormality identified. Soft tissues and spinal canal: No prevertebral fluid or swelling. No visible canal hematoma. Advanced chronic cervical carotid atherosclerosis. New right side carotid vascular stent since 2022. Disc levels: Diffuse idiopathic skeletal hyperostosis (DISH). Associated interbody ankylosis C3 through C7, and likely developing also at the cervicothoracic junction. Associated bulky disc and endplate degeneration in the cervical spine, but no CT evidence of spinal stenosis. Upper chest: Calcified aortic atherosclerosis. Otherwise negative visible noncontrast thoracic inlet. IMPRESSION:  1. No acute traumatic injury identified in the cervical spine. 2. DISH with widespread cervical intervertebral ankylosis. 3. Cervical right carotid artery stenting since 2022 CTA. Aortic Atherosclerosis (ICD10-I70.0). Electronically Signed   By: VEAR Hurst M.D.   On: 12/10/2023 12:35   CT Head Wo Contrast Result Date: 12/10/2023 CLINICAL DATA:  78 year old female with multiple falls recently. Increased weakness. EXAM: CT HEAD WITHOUT CONTRAST TECHNIQUE: Contiguous axial images were obtained  from the base of the skull through the vertex without intravenous contrast. RADIATION DOSE REDUCTION: This exam was performed according to the departmental dose-optimization program which includes automated exposure control, adjustment of the mA and/or kV according to patient size and/or use of iterative reconstruction technique. COMPARISON:  Brain MRI 09/10/2020.  Head CT 09/16/2020. FINDINGS: Brain: Progressive encephalomalacia posterior right MCA territory where MRI was abnormal in 2022. Ex vacuo ventricular enlargement now. Punctate dystrophic calcification associated there. Resolved small volume intracranial hemorrhage seen on prior CT. New since that time but circumscribed and chronic appearing lacunar infarct medial right lentiform (series 6, image 14). Contralateral left basal ganglia vascular calcification is stable. No midline shift, ventriculomegaly, mass effect, evidence of mass lesion, intracranial hemorrhage or evidence of cortically based acute infarction. Vascular: Calcified atherosclerosis at the skull base. No suspicious intracranial vascular hyperdensity. Skull: No acute osseous abnormality identified. Sinuses/Orbits: Substantially progressed bilateral paranasal sinus periosteal thickening compared to the prior CT. Generalized paranasal sinus mucosal thickening is similar. Tympanic cavities and mastoids well aerated. Small fluid levels in the paranasal sinuses. Other: Disconjugate gaze. No acute orbit or scalp  soft tissue finding. IMPRESSION: 1. No acute intracranial abnormality or acute traumatic injury identified. 2. Progressed chronic ischemic disease since 2022 post ischemic encephalomalacia in the right MCA territory. 3. Acute and chronic appearing bilateral paranasal sinus inflammation has progressed since 2022. Electronically Signed   By: VEAR Hurst M.D.   On: 12/10/2023 12:31   DG FEMUR MIN 2 VIEWS LEFT Result Date: 12/10/2023 CLINICAL DATA:  [id number] Fall [id number] Pain 144615. EXAM: LEFT FEMUR 2 VIEWS COMPARISON:  None Available. FINDINGS: No acute fracture or dislocation. No aggressive osseous lesion. There are mild degenerative changes of the hip joint without significant joint space narrowing. Osteophytosis of the superior acetabulum. Mild degenerative changes of the knee joint. No focal soft tissue swelling. No radiopaque foreign bodies. IMPRESSION: *No acute osseous abnormality of the left femur. Electronically Signed   By: Ree Molt M.D.   On: 12/10/2023 11:47   DG Forearm Left Result Date: 12/10/2023 CLINICAL DATA:  [id number] Fall [id number] Pain 144615 EXAM: LEFT FOREARM - 2 VIEW COMPARISON:  None Available. FINDINGS: There is diffuse osteopenia of the visualized osseous structures. No acute fracture or dislocation. No aggressive osseous lesion. Note is made of negative ulnar variance. Mild diffuse degenerative changes of imaged joints. No radiopaque foreign bodies. Soft tissues are within normal limits. IMPRESSION: No acute osseous abnormality of the left forearm. Electronically Signed   By: Ree Molt M.D.   On: 12/10/2023 11:39   DG Shoulder Left Result Date: 12/10/2023 CLINICAL DATA:  fall. EXAM: LEFT SHOULDER - 2+ VIEW COMPARISON:  None Available. FINDINGS: No acute fracture or dislocation. No aggressive osseous lesion. Glenohumeral and acromioclavicular joints are normal in alignment. Moderate osteoarthritis of the acromioclavicular joint. Mild osteoarthritis of the glenohumeral  joint. No soft tissue swelling. No radiopaque foreign bodies. IMPRESSION: No acute osseous abnormality of the left shoulder joint. Electronically Signed   By: Ree Molt M.D.   On: 12/10/2023 11:37   DG Pelvis 1-2 Views Result Date: 12/10/2023 CLINICAL DATA:  Fall. EXAM: PELVIS - 1-2 VIEW COMPARISON:  None Available. FINDINGS: Pelvis is intact with normal and symmetric sacroiliac joints. No acute fracture or dislocation. No aggressive osseous lesion. Visualized sacral arcuate lines are unremarkable. Unremarkable symphysis pubis. There are mild degenerative changes of bilateral hip joints without significant joint space narrowing. Osteophytosis of the superior acetabulum. No radiopaque foreign bodies. IMPRESSION: Negative. Electronically  Signed   By: Ree Molt M.D.   On: 12/10/2023 11:36   DG Hand Complete Left Result Date: 12/10/2023 CLINICAL DATA:  Fall.  Pain. EXAM: LEFT HAND - COMPLETE 3+ VIEW COMPARISON:  None Available. FINDINGS: There is diffuse osteopenia of the visualized osseous structures. No acute fracture or dislocation. No aggressive osseous lesion. Mild diffuse degenerative osteoarthritis of imaged joints. No radiopaque foreign bodies. Soft tissues are within normal limits. IMPRESSION: No acute osseous abnormality of the left hand. Electronically Signed   By: Ree Molt M.D.   On: 12/10/2023 11:36   EKG: Independently reviewed.  Sinus rhythm at 63 bpm.  Nonspecific T wave changes.  PVC noted.  Stable left bundle branch block.  Assessment/Plan Principal Problem:   Acute GI bleeding Active Problems:   Dyslipidemia   History of TIA (transient ischemic attack) and stroke   Essential hypertension   GI bleed > Patient presenting with multiple falls and incidentally noted to have hemoglobin 5.7 down from 11.65 days ago. > Not on anticoagulation.  But has had no falls as above.  No evidence of bleeding on CT head, CT chest abdomen pelvis.  No obvious hematomas to the legs.  FOBT  positive, so presumed primary source of bleeding is GI. > GI consulted in the ED and 2 units ordered for transfusion. - Monitor in progressive unit overnight - Appreciate GI recommendations and assistance - N.p.o. for now - Transfused 2 units - Trend CBC - Supportive care  Hypokalemia > Incidental potassium of 3.1.  Magnesium  normal. - 40 mEq IV potassium while n.p.o.  Abnormal CT > CT of the chest abdomen pelvis showed: - Infrarenal aortic aneurysm measuring 4.4 x 4.4 x 3.1 with recommendation for 1 year follow-up and vascular surgery referral - Subpleural nodule with recommendation for 3 to 66-month follow-up  Hypertension - Chart review shows patient has not picked up prescription for amlodipine  or valsartan  recently, continue to hold for now  Hyperlipidemia - Resume home rosuvastatin   History of CVA History of TIA - Resume home rosuvastatin   Recent back pain > Recently prescribed Norco and prednisone  with plan for follow-up outpatient. - Hold Norco with recent falls - Hold prednisone  with GI bleed  DVT prophylaxis: SCDs Code Status:   Full Family Communication:  None on admission  Disposition Plan:   Patient is from:  Home  Anticipated DC to:  Home  Anticipated DC date:  1 to 3 days  Anticipated DC barriers: None  Consults called:  Gastroenterology Admission status:  Observation, telemetry  Severity of Illness: The appropriate patient status for this patient is OBSERVATION. Observation status is judged to be reasonable and necessary in order to provide the required intensity of service to ensure the patient's safety. The patient's presenting symptoms, physical exam findings, and initial radiographic and laboratory data in the context of their medical condition is felt to place them at decreased risk for further clinical deterioration. Furthermore, it is anticipated that the patient will be medically stable for discharge from the hospital within 2 midnights of  admission.    Marsa KATHEE Scurry MD [facility]  How to contact the [facility] Attending or Consulting provider 7A - 7P or covering provider during after hours 7P -7A, for this patient?   Check the care team in [facility] and look for a) attending/consulting [facility] provider listed and b) the [facility] team listed Log into [email or web address] and use [facility] universal password to access. If you do not have the password, please contact the hospital  operator. Locate the [facility] provider you are looking for under [facility] and page to a number that you can be directly reached. If you still have difficulty reaching the provider, please page the [facility] (Director on Call) for the Hospitalists listed on amion for assistance.  12/10/2023, 3:09 PM

## 2023-12-10 NOTE — ED Provider Notes (Signed)
 [place] EMERGENCY DEPARTMENT AT [facility] Provider Note   CSN: [id number] Arrival date & time: 12/10/23  9062     Patient presents with: Fall and Weakness   [name] is a 78 y.o. female.  Patient with past medical history of hypertension, COPD, aortic aneurysm, CVA causing left-sided deficits presents to emergency room with complaint of weakness.  Per patient and EMS patient has had 4-5 falls daily for the past week.  Patient notes that she has a left-sided facial droop, left arm and left leg weakness at baseline but now her right leg feels weaker than normal and this is what is causing the falls. Of note, had recent ED visit for low back pain and right leg pain. Also started noticing diffuse bruising to upper and lower extremity with swelling to the area.  She is unsure if she is injured them during her falls.  Notes left wrist pain.  Otherwise no complaints. Tried contacting multiple family members, unable to get them on phone. Not on BT. No reported head or neck injury.     Fall  Weakness      Prior to Admission medications   Medication Sig Start Date End Date Taking? Authorizing Provider  amLODipine  (NORVASC ) 5 MG tablet TAKE 1 TABLET(5 MG) BY MOUTH DAILY 08/23/23   Lorren Greig PARAS, NP  aspirin  EC 81 MG EC tablet Take 1 tablet (81 mg total) by mouth daily. Swallow whole. Patient not taking: Reported on 05/30/2023 09/17/20   Vernon Ranks, MD  cephALEXin  (KEFLEX ) 500 MG capsule Take 1 capsule (500 mg total) by mouth 4 (four) times daily. 12/05/23   Minnie Tinnie BRAVO, PA  HYDROcodone -acetaminophen  (NORCO/VICODIN) 5-325 MG tablet Take 1 tablet by mouth every 6 (six) hours as needed for severe pain (pain score 7-10). 12/05/23   Geiple, Joshua, PA-C  predniSONE  (DELTASONE ) 20 MG tablet 3 Tabs PO Days 1-3, then 2 tabs PO Days 4-6, then 1 tab PO Day 7-9, then Half Tab PO Day 10-12 12/05/23   Desiderio Chew, PA-C  rosuvastatin  (CRESTOR ) 20 MG tablet TAKE 1 TABLET(20 MG) BY MOUTH  DAILY 08/23/23   Lorren Greig PARAS, NP  valsartan  (DIOVAN ) 40 MG tablet Take 1 tablet (40 mg total) by mouth daily. 05/30/23   Lorren Greig PARAS, NP    Allergies: Patient has no known allergies.    Review of Systems  Neurological:  Positive for weakness.    Updated Vital Signs BP (!) 156/67 (BP Location: Right Arm)   Pulse 71   Temp 98.5 F (36.9 C) (Oral)   Resp 17   Ht 5' 10 (1.778 m)   Wt 87.1 kg   SpO2 100%   BMI 27.55 kg/m   Physical Exam Vitals and nursing note reviewed.  Constitutional:      General: She is not in acute distress.    Appearance: She is not toxic-appearing.  HENT:     Head: Normocephalic and atraumatic.     Comments: Head appears atraumatic. Eyes:     General: No scleral icterus.    Conjunctiva/sclera: Conjunctivae normal.  Neck:     Comments: No cervical midline tenderness step-off or deformity. Cardiovascular:     Rate and Rhythm: Normal rate and regular rhythm.     Pulses: Normal pulses.     Heart sounds: Normal heart sounds.  Pulmonary:     Effort: Pulmonary effort is normal. No respiratory distress.     Breath sounds: Normal breath sounds.  Abdominal:     General:  Abdomen is flat. Bowel sounds are normal.     Palpations: Abdomen is soft.     Tenderness: There is no abdominal tenderness.  Musculoskeletal:     Right lower leg: Edema present.     Left lower leg: Edema present.     Comments: Patient with diffuse bruising over left lower extremity and left upper extremity with mild swelling.  Does have palpable radial pulse. Patient with bruising over right knee and right upper arm.  Mild lower extremity swelling. No bruising over chest or torso. Tenderness to palpation over left wrist.  Skin:    General: Skin is warm and dry.     Findings: No lesion.  Neurological:     General: No focal deficit present.     Mental Status: She is alert and oriented to person, place, and time. Mental status is at baseline.     Comments: Right leg 4/5  strength. Left leg 4/5 strength. Sensation equal bilaterally.  Right arm 5/5, left arm 4/5. Left facial droop.     (all labs ordered are listed, but only abnormal results are displayed) Labs Reviewed  CBC WITH DIFFERENTIAL/PLATELET  COMPREHENSIVE METABOLIC PANEL WITH GFR  URINALYSIS, ROUTINE W REFLEX MICROSCOPIC  PROTIME-INR  MAGNESIUM   CK    EKG: None  Radiology: No results found.   .Critical Care  Performed by: Shermon Warren SAILOR, PA-C Authorized by: Shermon Warren SAILOR, PA-C   Critical care provider statement:    Critical care time (minutes):  30   Critical care was necessary to treat or prevent imminent or life-threatening deterioration of the following conditions:  Circulatory failure and trauma   Critical care was time spent personally by me on the following activities:  Development of treatment plan with patient or surrogate, discussions with consultants, evaluation of patient's response to treatment, examination of patient, ordering and review of laboratory studies, ordering and review of radiographic studies, ordering and performing treatments and interventions, pulse oximetry, re-evaluation of patient's condition and review of old charts    Medications Ordered in the ED - No data to display  Clinical Course as of 12/10/23 1008  Sun Dec 10, 2023  1007 Attempted contacting patient's son listed in contact information on patient's chart x 2 with no answer, can't leave voice mail.  [JB]    Clinical Course User Index [JB] Tristian Bouska, Warren SAILOR, PA-C                                 Medical Decision Making Amount and/or Complexity of Data Reviewed Labs: ordered. Radiology: ordered.  Risk Prescription drug management. Decision regarding hospitalization.   This patient presents to the ED for concern of weakness, this involves an extensive number of treatment options, and is a complaint that carries with it a high risk of complications and morbidity.  The differential  diagnosis includes    Co morbidities that complicate the patient evaluation  Hx of CVA  Lab Tests:  I personally interpreted labs.  The pertinent results include:   CBC shows hemoglobin of 5.7 which is decreased in 5 days from 11.6 Fecal occult positive here with no gross blood on exam   Imaging Studies ordered:  I ordered imaging studies including CT scan of chest abdomen pelvis with contrast shows no acute abnormality.  CT head, cervical spine with no acute abnormality.  Obtained x-rays of femur forearm shoulder pelvis and left hand with no acute findings.  Cardiac Monitoring: /  EKG:  The patient was maintained on a cardiac monitor.  I personally viewed and interpreted the cardiac monitored which showed an underlying rhythm of: sinus    Consultations Obtained:  I requested consultation with the GI,  and discussed lab and imaging findings as well as pertinent plan - they recommend: Admit and will see in consult Hospitalist have put in admission orders   Problem List / ED Course / Critical interventions / Medication management  Patient presenting to emergency room with weakness and frequent falls.  On my exam her head appears atraumatic but does have obvious bruising over upper and lower extremities.  No obvious bruising or trauma to chest and abdomen.  Lungs clear to auscultation.  She is hemodynamically stable.  Her hemoglobin is 5.7, fecal occult is positive but no gross hematochezia or melena is noted.  Patient denies hematochezia or melena at home.  Given 5.7 and suspected GI bleed patient will be transfused with 2 units of blood.  Potassium will be supplemented.  Overall reassuring imaging findings.  GI will see the patient in consult.  Patient will be admitted for symptomatic anemia, frequent falls.     Final diagnoses:  Anemia, unspecified type  Gastrointestinal hemorrhage, unspecified gastrointestinal hemorrhage type    ED Discharge Orders     None           Shermon Warren SAILOR, PA-C 12/10/23 1546    Tegeler, Lonni PARAS, MD 12/10/23 [phone]

## 2023-12-11 DIAGNOSIS — I739 Peripheral vascular disease, unspecified: Secondary | ICD-10-CM | POA: Diagnosis present

## 2023-12-11 DIAGNOSIS — I10 Essential (primary) hypertension: Secondary | ICD-10-CM | POA: Diagnosis present

## 2023-12-11 DIAGNOSIS — J449 Chronic obstructive pulmonary disease, unspecified: Secondary | ICD-10-CM | POA: Diagnosis present

## 2023-12-11 DIAGNOSIS — R29810 Facial weakness: Secondary | ICD-10-CM | POA: Diagnosis present

## 2023-12-11 DIAGNOSIS — D649 Anemia, unspecified: Secondary | ICD-10-CM | POA: Diagnosis not present

## 2023-12-11 DIAGNOSIS — E876 Hypokalemia: Secondary | ICD-10-CM | POA: Diagnosis present

## 2023-12-11 DIAGNOSIS — I7143 Infrarenal abdominal aortic aneurysm, without rupture: Secondary | ICD-10-CM | POA: Diagnosis present

## 2023-12-11 DIAGNOSIS — Z8249 Family history of ischemic heart disease and other diseases of the circulatory system: Secondary | ICD-10-CM | POA: Diagnosis not present

## 2023-12-11 DIAGNOSIS — E785 Hyperlipidemia, unspecified: Secondary | ICD-10-CM | POA: Diagnosis present

## 2023-12-11 DIAGNOSIS — Z86718 Personal history of other venous thrombosis and embolism: Secondary | ICD-10-CM | POA: Diagnosis not present

## 2023-12-11 DIAGNOSIS — Z7952 Long term (current) use of systemic steroids: Secondary | ICD-10-CM | POA: Diagnosis not present

## 2023-12-11 DIAGNOSIS — Z751 Person awaiting admission to adequate facility elsewhere: Secondary | ICD-10-CM | POA: Diagnosis not present

## 2023-12-11 DIAGNOSIS — R296 Repeated falls: Secondary | ICD-10-CM

## 2023-12-11 DIAGNOSIS — Z87891 Personal history of nicotine dependence: Secondary | ICD-10-CM

## 2023-12-11 DIAGNOSIS — E538 Deficiency of other specified B group vitamins: Secondary | ICD-10-CM

## 2023-12-11 DIAGNOSIS — R609 Edema, unspecified: Secondary | ICD-10-CM | POA: Diagnosis present

## 2023-12-11 DIAGNOSIS — D6489 Other specified anemias: Secondary | ICD-10-CM | POA: Diagnosis present

## 2023-12-11 DIAGNOSIS — D696 Thrombocytopenia, unspecified: Secondary | ICD-10-CM | POA: Diagnosis present

## 2023-12-11 DIAGNOSIS — M549 Dorsalgia, unspecified: Secondary | ICD-10-CM | POA: Diagnosis present

## 2023-12-11 DIAGNOSIS — I69354 Hemiplegia and hemiparesis following cerebral infarction affecting left non-dominant side: Secondary | ICD-10-CM | POA: Diagnosis not present

## 2023-12-11 DIAGNOSIS — K922 Gastrointestinal hemorrhage, unspecified: Secondary | ICD-10-CM | POA: Diagnosis present

## 2023-12-11 DIAGNOSIS — Z792 Long term (current) use of antibiotics: Secondary | ICD-10-CM | POA: Diagnosis not present

## 2023-12-11 DIAGNOSIS — Z79899 Other long term (current) drug therapy: Secondary | ICD-10-CM | POA: Diagnosis not present

## 2023-12-11 LAB — CBC WITH DIFFERENTIAL/PLATELET
Abs Immature Granulocytes: 0.09 K/uL — ABNORMAL HIGH (ref 0.00–0.07)
Basophils Absolute: 0 K/uL (ref 0.0–0.1)
Basophils Relative: 0 %
Eosinophils Absolute: 0.2 K/uL (ref 0.0–0.5)
Eosinophils Relative: 3 %
HCT: 23.7 % — ABNORMAL LOW (ref 36.0–46.0)
Hemoglobin: 8 g/dL — ABNORMAL LOW (ref 12.0–15.0)
Immature Granulocytes: 1 %
Lymphocytes Relative: 19 %
Lymphs Abs: 1.3 K/uL (ref 0.7–4.0)
MCH: 30.9 pg (ref 26.0–34.0)
MCHC: 33.8 g/dL (ref 30.0–36.0)
MCV: 91.5 fL (ref 80.0–100.0)
Monocytes Absolute: 0.4 K/uL (ref 0.1–1.0)
Monocytes Relative: 6 %
Neutro Abs: 4.9 K/uL (ref 1.7–7.7)
Neutrophils Relative %: 71 %
Platelets: 80 K/uL — ABNORMAL LOW (ref 150–400)
RBC: 2.59 MIL/uL — ABNORMAL LOW (ref 3.87–5.11)
RDW: 14.2 % (ref 11.5–15.5)
WBC: 7 K/uL (ref 4.0–10.5)
nRBC: 1.6 % — ABNORMAL HIGH (ref 0.0–0.2)

## 2023-12-11 LAB — COMPREHENSIVE METABOLIC PANEL WITH GFR
ALT: 16 U/L (ref 0–44)
AST: 29 U/L (ref 15–41)
Albumin: 3 g/dL — ABNORMAL LOW (ref 3.5–5.0)
Alkaline Phosphatase: 43 U/L (ref 38–126)
Anion gap: 9 (ref 5–15)
BUN: 28 mg/dL — ABNORMAL HIGH (ref 8–23)
CO2: 23 mmol/L (ref 22–32)
Calcium: 8.3 mg/dL — ABNORMAL LOW (ref 8.9–10.3)
Chloride: 108 mmol/L (ref 98–111)
Creatinine, Ser: 0.81 mg/dL (ref 0.44–1.00)
GFR, Estimated: >60 mL/min (ref >60)
Glucose, Bld: 89 mg/dL (ref 70–99)
Potassium: 3.6 mmol/L (ref 3.5–5.1)
Sodium: 140 mmol/L (ref 135–145)
Total Bilirubin: 1 mg/dL (ref 0.0–1.2)
Total Protein: 5.6 g/dL — ABNORMAL LOW (ref 6.5–8.1)

## 2023-12-11 LAB — RETICULOCYTES
Immature Retic Fract: 41 % — ABNORMAL HIGH (ref 2.3–15.9)
RBC.: 2.59 MIL/uL — ABNORMAL LOW (ref 3.87–5.11)
Retic Count, Absolute: 81.1 K/uL (ref 19.0–186.0)
Retic Ct Pct: 3.1 % (ref 0.4–3.1)

## 2023-12-11 LAB — TYPE AND SCREEN
ABO/RH(D): B POS
Antibody Screen: NEGATIVE
Unit division: 0
Unit division: 0

## 2023-12-11 LAB — CBC
HCT: 23 % — ABNORMAL LOW (ref 36.0–46.0)
Hemoglobin: 7.6 g/dL — ABNORMAL LOW (ref 12.0–15.0)
MCH: 30.5 pg (ref 26.0–34.0)
MCHC: 33 g/dL (ref 30.0–36.0)
MCV: 92.4 fL (ref 80.0–100.0)
Platelets: 74 K/uL — ABNORMAL LOW (ref 150–400)
RBC: 2.49 MIL/uL — ABNORMAL LOW (ref 3.87–5.11)
RDW: 13.7 % (ref 11.5–15.5)
WBC: 6.4 K/uL (ref 4.0–10.5)
nRBC: 1.6 % — ABNORMAL HIGH (ref 0.0–0.2)

## 2023-12-11 LAB — BPAM RBC
Blood Product Expiration Date: 202508282359
Blood Product Expiration Date: 202508282359
ISSUE DATE / TIME: 202508031627
ISSUE DATE / TIME: 202508032010
Unit Type and Rh: 7300
Unit Type and Rh: 7300

## 2023-12-11 LAB — IRON AND TIBC
Iron: 70 ug/dL (ref 28–170)
Saturation Ratios: 25 % (ref 10.4–31.8)
TIBC: 277 ug/dL (ref 250–450)
UIBC: 207 ug/dL

## 2023-12-11 LAB — FERRITIN: Ferritin: 80 ng/mL (ref 11–307)

## 2023-12-11 LAB — VITAMIN B12: Vitamin B-12: 251 pg/mL (ref 180–914)

## 2023-12-11 LAB — FOLATE: Folate: 4.8 ng/mL — ABNORMAL LOW (ref >5.9)

## 2023-12-11 LAB — LACTATE DEHYDROGENASE: LDH: 241 U/L — ABNORMAL HIGH (ref 98–192)

## 2023-12-11 MED ORDER — VITAMIN B-12 1000 MCG PO TABS
1000.0000 ug | ORAL_TABLET | Freq: Every day | ORAL | Status: DC
  Administered 2023-12-14 – 2023-12-15 (×2): 1000 ug via ORAL
  Filled 2023-12-11 (×2): qty 1

## 2023-12-11 MED ORDER — HYDRALAZINE HCL 20 MG/ML IJ SOLN: 10.0000 mg | Freq: Four times a day (QID) | INTRAMUSCULAR | Status: DC | PRN

## 2023-12-11 MED ORDER — PANTOPRAZOLE SODIUM 40 MG PO TBEC
40.0000 mg | DELAYED_RELEASE_TABLET | Freq: Every day | ORAL | Status: DC
  Administered 2023-12-11 – 2023-12-15 (×5): 40 mg via ORAL
  Filled 2023-12-11 (×5): qty 1

## 2023-12-11 MED ORDER — CYANOCOBALAMIN 1000 MCG/ML IJ SOLN
1000.0000 ug | Freq: Every day | INTRAMUSCULAR | Status: AC
  Administered 2023-12-11 – 2023-12-13 (×3): 1000 ug via SUBCUTANEOUS
  Filled 2023-12-11 (×3): qty 1

## 2023-12-11 MED ORDER — AMLODIPINE BESYLATE 10 MG PO TABS
10.0000 mg | ORAL_TABLET | Freq: Every day | ORAL | Status: DC
  Administered 2023-12-11 – 2023-12-15 (×5): 10 mg via ORAL
  Filled 2023-12-11 (×5): qty 1

## 2023-12-11 NOTE — Plan of Care (Signed)
°  Problem: Education: °Goal: Knowledge of General Education information will improve °Description: Including pain rating scale, medication(s)/side effects and non-pharmacologic comfort measures °Outcome: Progressing °  °Problem: Clinical Measurements: °Goal: Ability to maintain clinical measurements within normal limits will improve °Outcome: Progressing °Goal: Will remain free from infection °Outcome: Progressing °  °Problem: Coping: °Goal: Level of anxiety will decrease °Outcome: Progressing °  °

## 2023-12-11 NOTE — Evaluation (Signed)
 Physical Therapy Evaluation Patient Details Name: [name] MRN: [id number] DOB: 21-Jun-1945 Today's Date: 12/11/2023  History of Present Illness  Pt is a 78 y/o female presenting 8/3 after suffering 4-5 falls a day for the past couple of days.  Work up included anemia with Hgb in the 5's, now 8.1 with 2 units of blood.  PMHx:  HTN, HLD, CVA, CAPD, DVT, IVH  Clinical Impression  Pt admitted with/for multiple falls in part due to low HGB in the 5's.  Pt is needing mod to max assist for all mobility and balance.  Pt currently limited functionally due to the problems listed. ( See problems list.)   Pt will benefit from PT to maximize function and safety in order to get ready for next venue listed below.  Patient will benefit from continued inpatient follow up therapy, <3 hours/day          If plan is discharge home, recommend the following:     Can travel by private vehicle   Yes    Equipment Recommendations    Recommendations for Other Services       Functional Status Assessment Patient has had a recent decline in their functional status and demonstrates the ability to make significant improvements in function in a reasonable and predictable amount of time.     Precautions / Restrictions Precautions Precautions: Fall Recall of Precautions/Restrictions: Intact      Mobility  Bed Mobility Overal bed mobility: Needs Assistance Bed Mobility: Supine to Sit     Supine to sit: Mod assist     General bed mobility comments: assist of both LE's  and truncal assist.    Transfers Overall transfer level: Needs assistance Equipment used: Quad cane (face to face) Transfers: Sit to/from Stand Sit to Stand: Mod assist           General transfer comment: 6 standing trials through pericare bed change with face to face assist and use of the RW.    Ambulation/Gait               General Gait Details: side stepping x 2 feet with with max and blocking of L LE  Stairs             Wheelchair Mobility     Tilt Bed    Modified Rankin (Stroke Patients Only)       Balance Overall balance assessment: Needs assistance Sitting-balance support: Single extremity supported, Bilateral upper extremity supported, Feet supported Sitting balance-Leahy Scale: Poor Sitting balance - Comments: posterior bias   Standing balance support: Single extremity supported, Bilateral upper extremity supported, During functional activity Standing balance-Leahy Scale: Poor                               Pertinent Vitals/Pain Pain Assessment Pain Assessment: Faces Faces Pain Scale: No hurt Pain Location: un known Pain Intervention(s): Monitored during session    Home Living Family/patient expects to be discharged to:: Private residence Living Arrangements: Children;Other relatives (lives in grandson's apt, also with one of her dtrs.) Available Help at Discharge: Available PRN/intermittently Type of Home: Apartment Home Access: Level entry     Alternate Level Stairs-Number of Steps: flight Home Layout: Two level;Able to live on main level with bedroom/bathroom Home Equipment: Rolling Walker (2 wheels)      Prior Function Prior Level of Function : Needs assist             Mobility Comments:  needs assist which is inadequate presently ADLs Comments: needs assist which is inadequate lately     Extremity/Trunk Assessment   Upper Extremity Assessment Upper Extremity Assessment: LUE deficits/detail LUE Deficits / Details: held in mild flexion, paretic, difficult to hold the RW LUE Coordination: decreased fine motor    Lower Extremity Assessment Lower Extremity Assessment: LLE deficits/detail;Generalized weakness LLE Coordination: decreased fine motor    Cervical / Trunk Assessment Cervical / Trunk Assessment: Kyphotic  Communication   Communication Communication: No apparent difficulties Factors Affecting Communication: Reduced clarity of  speech    Cognition Arousal: Alert Behavior During Therapy: Lability, Flat affect (crying frequently about her living conditions.)   PT - Cognitive impairments: No family/caregiver present to determine baseline                         Following commands: Intact       Cueing       General Comments General comments (skin integrity, edema, etc.): vss overall, Pt's living situation is inadequate for her needs.  Pt's grandson and dtr do no often help her when needed    Exercises Other Exercises Other Exercises: warm up exercise prior to mobility.  Increased tone L U and LE   Assessment/Plan    PT Assessment Patient needs continued PT services  PT Problem List Decreased strength;Decreased activity tolerance;Decreased mobility;Decreased knowledge of use of DME;Decreased cognition       PT Treatment Interventions DME instruction;Gait training;Functional mobility training;Therapeutic activities;Balance training;Neuromuscular re-education;Patient/family education    PT Goals (Current goals can be found in the Care Plan section)  Acute Rehab PT Goals Patient Stated Goal: pt hopes to get a into a better situation or get a better to function in her p PT Goal Formulation: With patient Time For Goal Achievement: 12/25/23 Potential to Achieve Goals: Fair    Frequency Min 3X/week     Co-evaluation               AM-PAC PT 6 Clicks Mobility  Outcome Measure Help needed turning from your back to your side while in a flat bed without using bedrails?: A Lot Help needed moving from lying on your back to sitting on the side of a flat bed without using bedrails?: A Lot Help needed moving to and from a bed to a chair (including a wheelchair)?: A Lot Help needed standing up from a chair using your arms (e.g., wheelchair or bedside chair)?: A Lot Help needed to walk in hospital room?: Total Help needed climbing 3-5 steps with a railing? : Total 6 Click Score: 10    End of  Session         PT Visit Diagnosis: Other abnormalities of gait and mobility (R26.89);Repeated falls (R29.6);Muscle weakness (generalized) (M62.81);Difficulty in walking, not elsewhere classified (R26.2)    Time: 8371-8273 PT Time Calculation (min) (ACUTE ONLY): 58 min   Charges:   PT Evaluation $PT Eval Moderate Complexity: 1 Mod PT Treatments $Therapeutic Exercise: 8-22 mins $Therapeutic Activity: 8-22 mins $Neuromuscular Re-education: 8-22 mins $ PT Supplies: 1 Supply PT General Charges $$ ACUTE PT VISIT: 1 Visit         12/11/2023  [place] HERO., PT Acute Rehabilitation Services [phone]  (office)  Vinie GAILS Shantoya Geurts 12/11/2023, 8:11 PM

## 2023-12-11 NOTE — Consult Note (Addendum)
 [facility]  Telephone:([phone] Fax:(336) [phone]    HEMATOLOGY CONSULTATION  PURPOSE OF CONSULTATION/CHIEF COMPLAINT: Anemia, thrombocytopenia  Referring MD: Dr. Dennise   HPI: [name] is a 78 year old female patient who was admitted on 12/10/2023 with complaints of falls.  Apparently patient had been falling 4-5 times per day for the days preceding admission. Workup was done in the ED which showed acute anemia.  Therefore hematology evaluation has been requested. Patient is seen assessed and examined today.  She is awake and alert sitting up in bed.  She answers all questions appropriately although some of her speech is not comprehensible. Medical history significant for CVA in 2022, TIA, history of DVT, hypertension and COPD. Surgical history includes thrombectomy in 2022, although patient states she has not had any surgeries before. Family history is noncontributory, states she does not recall any family members with blood disorders or cancer. Social history is significant for tobacco use, reports she started smoking at age 88 2-1/2 packs/day for 30 years, quit 30 years ago.  Admits to alcohol use during the same.,  Quit the same time.  Admits to a little use of marijuana during that time.  Denies other illicit drug use.  Worked at [employer] with no occupational hazardous exposure.   ASSESSMENT AND PLAN:  Anemia, normocytic GI bleeding - Patient admitted on 8/3 with hemoglobin 5.4. - Status post PRBC transfusions - Hemoglobin improved to 8.0 today - Recommend PRBC transfusions for Hgb <7.0 - Ferritin and iron sat stable - LDH elevated.  Haptoglobin pending -- FOBT+ -Seen by GI, no further plans for endoscopic evaluation - Recommend bone marrow biopsy to delineate if hematologic cause of anemia and thrombocytopenia - Continue to monitor CBC with differential - Hematology/Dr. Lanny will evaluate and make further recommendations  Thrombocytopenia -  Platelets 80K - Recommend platelet transfusion for counts <20 K or <50 K with acute bleeding - No transfusional intervention required at this time  B12 deficiency - B12 level suboptimal 251 - Agree with B12 1000 mcg injections daily x 3. - Consider continuation of B12 1000 mcg injection weekly upon discharge - Noted oral vitamin B-12 1000 mcg daily  Multiple falls -- Status post multiple falls at home. Reports she lives with [name] and has a walker to ambulate at home. -- may have been secondary to severe anemia and weakness -- Continue supportive care  Hx of CVA, TIA -- Denies headache, chest pain or other acute pain -- Continue close observation      Past Medical History:  Diagnosis Date   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 (HCC) 09/16/2020   COPD (chronic obstructive pulmonary disease) (HCC)    History of DVT (deep vein thrombosis)    Hypertension    Thoracoabdominal aortic aneurysm (HCC)    TIA (transient ischemic attack) 09/10/2020  :  Past Surgical History:  Procedure Laterality Date   IR CT HEAD LTD  09/11/2020   IR INTRAVSC STENT CERV CAROTID W/O EMB-PROT MOD SED INC ANGIO  09/11/2020   IR PERCUTANEOUS ART THROMBECTOMY/INFUSION INTRACRANIAL INC DIAG ANGIO  09/11/2020       IR PERCUTANEOUS ART THROMBECTOMY/INFUSION INTRACRANIAL INC DIAG ANGIO  09/11/2020   IR US  GUIDE VASC ACCESS RIGHT  09/11/2020   NO PAST SURGERIES     RADIOLOGY WITH ANESTHESIA N/A 09/11/2020   Procedure: IR WITH ANESTHESIA;  Surgeon: Dolphus Carrion, MD;  Location: [facility] OR;  Service: Radiology;  Laterality: N/A;  :  No Known Allergies:  Family History  Problem Relation Age of Onset   Hypertension Mother   :   Social History   Socioeconomic History   Marital status: Single    Spouse name: Not on file   Number of children: Not on file   Years of education: Not on file   Highest education level: Not on file  Occupational History   Not  on file  Tobacco Use   Smoking status: Former    Current packs/day: 0.00    Types: Cigarettes    Quit date: 05/10/1987    Years since quitting: 36.6   Smokeless tobacco: Never  Vaping Use   Vaping status: Never Used  Substance and Sexual Activity   Alcohol use: Not Currently   Drug use: Never   Sexual activity: Not Currently    Birth control/protection: None  Other Topics Concern   Not on file  Social History Narrative   Not on file   Social Drivers of Health   Financial Resource Strain: Not on file  Food Insecurity: Not on file  Transportation Needs: Not on file  Physical Activity: Not on file  Stress: Not on file  Social Connections: Not on file  Intimate Partner Violence: Not on file  :   CURRENT MEDS: Current Facility-Administered Medications  Medication Dose Route Frequency Provider Last Rate Last Admin   acetaminophen  (TYLENOL ) tablet 650 mg  650 mg Oral Q6H PRN Seena Marsa NOVAK, MD   650 mg at 12/11/23 1228   Or   acetaminophen  (TYLENOL ) suppository 650 mg  650 mg Rectal Q6H PRN Seena Marsa NOVAK, MD       amLODipine  (NORVASC ) tablet 10 mg  10 mg Oral Daily Singh, Prashant K, MD   10 mg at 12/11/23 1228   cyanocobalamin  (VITAMIN B12) injection 1,000 mcg  1,000 mcg Subcutaneous Daily Dennise Lavada POUR, MD   1,000 mcg at 12/11/23 1228   [START ON 12/14/2023] cyanocobalamin  (VITAMIN B12) tablet 1,000 mcg  1,000 mcg Oral Daily Dennise Lavada POUR, MD       hydrALAZINE  (APRESOLINE ) injection 10 mg  10 mg Intravenous Q6H PRN Dennise Lavada POUR, MD       pantoprazole  (PROTONIX ) EC tablet 40 mg  40 mg Oral Daily Singh, Prashant K, MD   40 mg at 12/11/23 1228   rosuvastatin  (CRESTOR ) tablet 20 mg  20 mg Oral Daily Melvin, Alexander B, MD   20 mg at 12/11/23 1016   sodium chloride  flush (NS) 0.9 % injection 3 mL  3 mL Intravenous Q12H Seena Marsa NOVAK, MD   3 mL at 12/11/23 1016    PHYSICAL EXAMINATION: ECOG PERFORMANCE STATUS: 3 - Symptomatic, >50% confined to  bed  Vitals:   12/11/23 1149 12/11/23 1152  BP: (!) 148/136   Pulse:  (!) 141  Resp:  (!) 22  Temp:  97.8 F (36.6 C)  SpO2:  99%   Filed Weights   12/10/23 0930  Weight: 192 lb (87.1 kg)    GENERAL: alert, no distress and comfortable +left sided weakness SKIN: skin color, texture, turgor are normal, no rashes or significant lesions EYES: normal, conjunctiva are pink and non-injected, sclera clear OROPHARYNX: no exudate, no erythema and lips, buccal mucosa, and tongue normal  NECK: supple, thyroid normal size, non-tender, without nodularity LYMPH: no palpable lymphadenopathy in the cervical, axillary or inguinal LUNGS: clear to auscultation and percussion with normal breathing effort HEART: regular rate & rhythm and no murmurs and no lower extremity edema ABDOMEN: abdomen soft, non-tender and  normal bowel sounds MUSCULOSKELETAL: no cyanosis of digits and no clubbing  PSYCH: alert & oriented x 3 with fluent speech NEURO: no focal motor/sensory deficits   LABS: Lab Results  Component Value Date   WBC 7.0 12/11/2023   HGB 8.0 (L) 12/11/2023   HCT 23.7 (L) 12/11/2023   MCV 91.5 12/11/2023   PLT 80 (L) 12/11/2023    Lab Results  Component Value Date   WBC 7.0 12/11/2023   HGB 8.0 (L) 12/11/2023   HCT 23.7 (L) 12/11/2023   PLT 80 (L) 12/11/2023   GLUCOSE 89 12/11/2023   CHOL 288 (H) 05/30/2023   TRIG 248 (H) 05/30/2023   HDL 78 05/30/2023   LDLCALC 165 (H) 05/30/2023   ALT 16 12/11/2023   AST 29 12/11/2023   NA 140 12/11/2023   K 3.6 12/11/2023   CL 108 12/11/2023   CREATININE 0.81 12/11/2023   BUN 28 (H) 12/11/2023   CO2 23 12/11/2023   INR 1.4 (H) 12/10/2023   HGBA1C 5.3 09/11/2020    CT CHEST ABDOMEN PELVIS W CONTRAST Result Date: 12/10/2023 EXAM: CT CHEST, ABDOMEN AND PELVIS WITH CONTRAST 12/10/2023 01:43:26 PM TECHNIQUE: CT of the chest, abdomen and pelvis was performed with the administration of intravenous contrast. Multiplanar reformatted images are  provided for review. Automated exposure control, iterative reconstruction, and/or weight based adjustment of the mA/kV was utilized to reduce the radiation dose to as low as reasonably achievable. COMPARISON: 11/16/2010 CLINICAL HISTORY: Polytrauma, blunt; Multiple falls, pain. Triage notes; Pt arrived by EMS from home. Reports that she has had multi falls over past 1 1/2 weeks. Denies hitting head. No anticoagulant use. Pt has positive recent history of UTI dx, but keflex  bottle found in home was still full. Hydro/acet was empty. Pt c/o ; increased weakness and loss of independence. Lives at home with daughter and other family members. Pt w/hx CVA + L sided weakness. Presents with multiple bruising over entire body predominantly on L hip. FINDINGS: CHEST: MEDIASTINUM: Heart and pericardium are unremarkable. The central airways are clear. Small hiatal hernia. THORACIC LYMPH NODES: No mediastinal, hilar or axillary lymphadenopathy. LUNGS AND PLEURA: No focal consolidation or pulmonary edema. No pleural effusion or pneumothorax. Pleural calcifications are identified overlying the left lung, unchanged from the previous exam. Chronic band-like area of scarring is noted within the posterior and medial left lower lobe. Subpleural nodule in the posterior left lung base measures 0.9 x 0.6 cm, new from previous exam. ABDOMEN AND PELVIS: LIVER: The liver is unremarkable. GALLBLADDER AND BILE DUCTS: Gallbladder is unremarkable. No biliary ductal dilatation. SPLEEN: No acute abnormality. PANCREAS: No acute abnormality. ADRENAL GLANDS: No acute abnormality. KIDNEYS, URETERS AND BLADDER: No stones in the kidneys or ureters. No hydronephrosis. No perinephric or periureteral stranding. Urinary bladder is unremarkable. GI AND BOWEL: Stomach demonstrates no acute abnormality. There is no bowel obstruction. The appendix is visualized and appears normal. REPRODUCTIVE ORGANS: No acute abnormality. PERITONEUM AND RETROPERITONEUM: No  ascites. No free air. VASCULATURE: Aortic atherosclerotic calcification. Focal penetrating atherosclerotic ulceration noted along the undersurface of the aortic arch measuring 1.7 x 0.8 cm, sagittal image 89/7. Coronary artery calcifications. The infrarenal abdominal aorta measures 4.4 x 4.4 cm. 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 ABDOMINAL AND PELVIS LYMPH NODES: No lymphadenopathy. BONES AND SOFT TISSUES: Thoracic degenerative disc disease. There is asymmetric soft tissue edema and subcutaneous fat stranding within the soft tissues overlying the lateral aspect and anterior aspect of the left hip and  image portions of the proximal left lower extremity. Overlying skin thickening noted. Mild subcutaneous soft tissue stranding and skin thickening overlies the lateral aspect right hip. IMPRESSION: 1. No acute findings in the chest, abdomen, and pelvis related to the polytrauma and multiple falls. 2. 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 Follow up in 12 months and referral to vascular surgery. 3. 9x6 mm subpleural nodule within the left base appears smoothly marginated and may represent a benign intrapulmonary lymph node. Initial follow-up examination in 3 to 6 months is recommended. 4. Asymmetric soft tissue edema and subcutaneous fat stranding overlying the lateral and anterior left hip and proximal left lower extremity, with overlying skin thickening. Mild subcutaneous soft tissue stranding and skin thickening overlies the lateral aspect of the right hip. These findings may reflect superficial soft tissue contusion, or anasarca. Clinical correlation advised. Electronically signed by: Waddell Calk MD 12/10/2023 02:04 PM EDT RP Workstation: [id number]   CT Cervical Spine Wo Contrast Result Date: 12/10/2023 CLINICAL DATA:  78 year old female with multiple falls recently.  Increased weakness. EXAM: CT CERVICAL SPINE WITHOUT CONTRAST TECHNIQUE: Multidetector CT imaging of the cervical spine was performed without intravenous contrast. Multiplanar CT image reconstructions were also generated. RADIATION DOSE REDUCTION: This exam was performed according to the departmental dose-optimization program which includes automated exposure control, adjustment of the mA and/or kV according to patient size and/or use of iterative reconstruction technique. COMPARISON:  Head CT today.  CTA head and neck 09/11/2020. FINDINGS: Alignment: Stable straightening of lordosis. Cervicothoracic junction alignment is within normal limits. Bilateral posterior element alignment is within normal limits. Skull base and vertebrae: Bone mineralization is within normal limits for age. Visualized skull base is intact. No atlanto-occipital dissociation. C1 and C2 appear intact and aligned. No acute osseous abnormality identified. Soft tissues and spinal canal: No prevertebral fluid or swelling. No visible canal hematoma. Advanced chronic cervical carotid atherosclerosis. New right side carotid vascular stent since 2022. Disc levels: Diffuse idiopathic skeletal hyperostosis (DISH). Associated interbody ankylosis C3 through C7, and likely developing also at the cervicothoracic junction. Associated bulky disc and endplate degeneration in the cervical spine, but no CT evidence of spinal stenosis. Upper chest: Calcified aortic atherosclerosis. Otherwise negative visible noncontrast thoracic inlet. IMPRESSION: 1. No acute traumatic injury identified in the cervical spine. 2. DISH with widespread cervical intervertebral ankylosis. 3. Cervical right carotid artery stenting since 2022 CTA. Aortic Atherosclerosis (ICD10-I70.0). Electronically Signed   By: VEAR Hurst M.D.   On: 12/10/2023 12:35   CT Head Wo Contrast Result Date: 12/10/2023 CLINICAL DATA:  78 year old female with multiple falls recently. Increased weakness. EXAM: CT  HEAD WITHOUT CONTRAST TECHNIQUE: Contiguous axial images were obtained from the base of the skull through the vertex without intravenous contrast. RADIATION DOSE REDUCTION: This exam was performed according to the departmental dose-optimization program which includes automated exposure control, adjustment of the mA and/or kV according to patient size and/or use of iterative reconstruction technique. COMPARISON:  Brain MRI 09/10/2020.  Head CT 09/16/2020. FINDINGS: Brain: Progressive encephalomalacia posterior right MCA territory where MRI was abnormal in 2022. Ex vacuo ventricular enlargement now. Punctate dystrophic calcification associated there. Resolved small volume intracranial hemorrhage seen on prior CT. New since that time but circumscribed and chronic appearing lacunar infarct medial right lentiform (series 6, image 14). Contralateral left basal ganglia vascular calcification is stable. No midline shift, ventriculomegaly, mass effect, evidence of mass lesion, intracranial hemorrhage or evidence of  cortically based acute infarction. Vascular: Calcified atherosclerosis at the skull base. No suspicious intracranial vascular hyperdensity. Skull: No acute osseous abnormality identified. Sinuses/Orbits: Substantially progressed bilateral paranasal sinus periosteal thickening compared to the prior CT. Generalized paranasal sinus mucosal thickening is similar. Tympanic cavities and mastoids well aerated. Small fluid levels in the paranasal sinuses. Other: Disconjugate gaze. No acute orbit or scalp soft tissue finding. IMPRESSION: 1. No acute intracranial abnormality or acute traumatic injury identified. 2. Progressed chronic ischemic disease since 2022 post ischemic encephalomalacia in the right MCA territory. 3. Acute and chronic appearing bilateral paranasal sinus inflammation has progressed since 2022. Electronically Signed   By: VEAR Hurst M.D.   On: 12/10/2023 12:31   DG FEMUR MIN 2 VIEWS LEFT Result Date:  12/10/2023 CLINICAL DATA:  [id number] Fall [id number] Pain 144615. EXAM: LEFT FEMUR 2 VIEWS COMPARISON:  None Available. FINDINGS: No acute fracture or dislocation. No aggressive osseous lesion. There are mild degenerative changes of the hip joint without significant joint space narrowing. Osteophytosis of the superior acetabulum. Mild degenerative changes of the knee joint. No focal soft tissue swelling. No radiopaque foreign bodies. IMPRESSION: *No acute osseous abnormality of the left femur. Electronically Signed   By: Ree Molt M.D.   On: 12/10/2023 11:47   DG Forearm Left Result Date: 12/10/2023 CLINICAL DATA:  [id number] Fall [id number] Pain 144615 EXAM: LEFT FOREARM - 2 VIEW COMPARISON:  None Available. FINDINGS: There is diffuse osteopenia of the visualized osseous structures. No acute fracture or dislocation. No aggressive osseous lesion. Note is made of negative ulnar variance. Mild diffuse degenerative changes of imaged joints. No radiopaque foreign bodies. Soft tissues are within normal limits. IMPRESSION: No acute osseous abnormality of the left forearm. Electronically Signed   By: Ree Molt M.D.   On: 12/10/2023 11:39   DG Shoulder Left Result Date: 12/10/2023 CLINICAL DATA:  fall. EXAM: LEFT SHOULDER - 2+ VIEW COMPARISON:  None Available. FINDINGS: No acute fracture or dislocation. No aggressive osseous lesion. Glenohumeral and acromioclavicular joints are normal in alignment. Moderate osteoarthritis of the acromioclavicular joint. Mild osteoarthritis of the glenohumeral joint. No soft tissue swelling. No radiopaque foreign bodies. IMPRESSION: No acute osseous abnormality of the left shoulder joint. Electronically Signed   By: Ree Molt M.D.   On: 12/10/2023 11:37   DG Pelvis 1-2 Views Result Date: 12/10/2023 CLINICAL DATA:  Fall. EXAM: PELVIS - 1-2 VIEW COMPARISON:  None Available. FINDINGS: Pelvis is intact with normal and symmetric sacroiliac joints. No acute fracture or  dislocation. No aggressive osseous lesion. Visualized sacral arcuate lines are unremarkable. Unremarkable symphysis pubis. There are mild degenerative changes of bilateral hip joints without significant joint space narrowing. Osteophytosis of the superior acetabulum. No radiopaque foreign bodies. IMPRESSION: Negative. Electronically Signed   By: Ree Molt M.D.   On: 12/10/2023 11:36   DG Hand Complete Left Result Date: 12/10/2023 CLINICAL DATA:  Fall.  Pain. EXAM: LEFT HAND - COMPLETE 3+ VIEW COMPARISON:  None Available. FINDINGS: There is diffuse osteopenia of the visualized osseous structures. No acute fracture or dislocation. No aggressive osseous lesion. Mild diffuse degenerative osteoarthritis of imaged joints. No radiopaque foreign bodies. Soft tissues are within normal limits. IMPRESSION: No acute osseous abnormality of the left hand. Electronically Signed   By: Ree Molt M.D.   On: 12/10/2023 11:36   VAS US  LOWER EXTREMITY VENOUS (DVT) (7a-7p) Result Date: 12/06/2023  Lower Venous DVT Study Patient Name:  CHERYLANNE ARDELEAN  Date of Exam:   12/05/2023 Medical Rec #:  [id number]    Accession #:    [id number] Date of Birth: 1945/10/06   Patient Gender: F Patient Age:   36 years Exam Location:  [facility] Procedure:      VAS US  LOWER EXTREMITY VENOUS (DVT) Referring Phys: JOSHUA GEIPLE --------------------------------------------------------------------------------  Indications: Pain.  Risk Factors: DVT Hx of LE DVT. Performing Technologist: Elmarie Lindau, RVT  Examination Guidelines: A complete evaluation includes B-mode imaging, spectral Doppler, color Doppler, and power Doppler as needed of all accessible portions of each vessel. Bilateral testing is considered an integral part of a complete examination. Limited examinations for reoccurring indications may be performed as noted. The reflux portion of the exam is performed with the patient in reverse Trendelenburg.   +---------+---------------+---------+-----------+----------+--------------+ RIGHT    CompressibilityPhasicitySpontaneityPropertiesThrombus Aging +---------+---------------+---------+-----------+----------+--------------+ CFV      Full           Yes      Yes                                 +---------+---------------+---------+-----------+----------+--------------+ SFJ      Full                                                        +---------+---------------+---------+-----------+----------+--------------+ FV Prox  Partial                                      Chronic        +---------+---------------+---------+-----------+----------+--------------+ FV Mid   Partial                                      Chronic        +---------+---------------+---------+-----------+----------+--------------+ FV DistalNone                                         Chronic        +---------+---------------+---------+-----------+----------+--------------+ PFV      Full                                                        +---------+---------------+---------+-----------+----------+--------------+ POP      Partial                                      Chronic        +---------+---------------+---------+-----------+----------+--------------+ PTV      Full                                                        +---------+---------------+---------+-----------+----------+--------------+     Summary: RIGHT: - Findings consistent with chronic deep vein thrombosis involving  the right femoral vein, and right popliteal vein.  - No cystic structure found in the popliteal fossa.   *See table(s) above for measurements and observations. Electronically signed by Norman Serve on 12/06/2023 at 10:07:40 AM.    Final    CT Lumbar Spine Wo Contrast Result Date: 12/05/2023 CLINICAL DATA:  Low back pain, increased fracture risk Myelopathy, acute, lumbar spine EXAM: CT LUMBAR SPINE WITHOUT  CONTRAST TECHNIQUE: Multidetector CT imaging of the lumbar spine was performed without intravenous contrast administration. Multiplanar CT image reconstructions were also generated. RADIATION DOSE REDUCTION: This exam was performed according to the departmental dose-optimization program which includes automated exposure control, adjustment of the mA and/or kV according to patient size and/or use of iterative reconstruction technique. COMPARISON:  None Available. FINDINGS: Segmentation: Normal. 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 Vertebrae: No fractures or osseous lesions. Paraspinal and other soft tissues: There is a fusiform infrarenal abdominal aorta aneurysm present, which measures up to of 4.3 x 4.2 cm in cross-sectional diameter. 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 The paraspinous soft tissues are otherwise unremarkable. Disc levels: L1-2: Normal. L2-3: Normal. L3-4: Broad-based disc bulging and bilateral facet hypertrophy, resulting in mild-to-moderate central spinal canal stenosis and bilateral lateral recess stenosis. L4-5: Broad-based disc bulging and moderate bilateral facet hypertrophy, causing moderate central spinal canal stenosis and moderate bilateral lateral recess stenosis. L5-S1: Disc space narrowing. Moderate bilateral facet arthrosis. No significant spinal canal or neural foraminal stenosis. IMPRESSION: 1. 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 2. Chronic degenerative disc disease at L3-4 with mild-to-moderate central spinal canal and bilateral lateral recess stenosis. 3. 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 Electronically Signed   By: Evalene Coho M.D.   On: 12/05/2023 12:33     The total time spent in the  appointment was 55 minutes encounter with patients including review of chart and various tests results, discussions about plan of care and coordination of care plan   All questions were answered. The patient knows to call the clinic with any problems, questions or concerns. No barriers to learning was detected.  Thank you for the courtesy of this consultation, Olam JINNY Brunner, NP  8/4/20252:29 PM    Addendum I have seen the patient, examined her. I agree with the assessment and and plan and have edited the notes.   78 yo female with PMH of hypertension, dyslipidemia, CVA, COPD, DVT, who presented with multiple falls at home.  She was admitted for severe anemia with Hg 5.4, and plt 45-80K.  Iron study was normal, B12 level slightly low at 254, folate level was low.  MCV is normal.  Her reticulocyte count was normal, LDH slightly elevated, CMP was unremarkable with normal.  No lab evidence of hemolysis.  Her stool was positive for B, but no frank GI bleeding.  GI has been consulted, but does not think her anemia is related to GI bleeding and does not recommend EGD.  CT scan yesterday was unremarkable for malignancy or cirrhosis.  Her CBC was unremarkable septa mild anemia with hemoglobin 11.1.  Not sure how long she had anemia. Her peripheral blood smear was unremarkable.  I recommend a bone marrow biopsy to rule out a primary bone marrow disease. She has received blood transfusion and hemoglobin was 8.0 this morning.  If  stable, okay to discharge after bone marrow biopsy and I will follow her up in my office.  Onita Mattock MD 12/11/2023

## 2023-12-11 NOTE — Plan of Care (Signed)

## 2023-12-11 NOTE — Care Management Obs Status (Signed)
 MEDICARE OBSERVATION STATUS NOTIFICATION   Patient Details  Name: Camrie Stock MRN: [id number] Date of Birth: 04/16/1946   Medicare Observation Status Notification Given:  Yes  Patient understood obs letter and want a copy  Averie Hornbaker 12/11/2023, 3:31 PM

## 2023-12-11 NOTE — Progress Notes (Signed)
 Patient from home with multiple falls. Awaiting PT eval for recommendations.

## 2023-12-11 NOTE — Progress Notes (Signed)
 Daily Progress Note  DOA: 12/10/2023 Hospital Day: 2   Patient Profile:   78 yo female with a past medical history including but not limited to COPD, CVA, DVT, AAA. Admitted with multiple falls at home. Found to have severe anemia.    ASSESSMENT    Acute worsening of chronic [place] anemia New thrombocytopenia Presenting hgb 5.4, down from 11.6 a few days prior.  No overt GI blood loss but FOBT +. No evidence for iron deficiency . B123 low normal range. Folate slightly low at 4.8. Acute worsening of anemia with concomitant thrombocytopenia suggestive of hematologic disorder.     Principal Problem:   Acute GI bleeding Active Problems:   Dyslipidemia   History of TIA (transient ischemic attack) and stroke   Essential hypertension   PLAN   -Awaiting Hematology's evaluation. No plans for endoscopic evaluation right now --Folate repletion per admitting team   Subjective   Feels okay. No GI complaints.   Objective    Recent Labs    12/10/23 1200 12/10/23 1207 12/11/23 0329 12/11/23 0618  WBC 7.5  --  6.4 7.0  HGB 5.7* 5.8* 7.6* 8.0*  HCT 17.5* 17.0* 23.0* 23.7*  MCV 93.6  --  92.4 91.5  PLT 78*  --  74* 80*   Recent Labs    12/11/23 0619  FOLATE 4.8*  VITAMINB12 251  FERRITIN 80  TIBC 277  IRONPCTSAT 25   Recent Labs    12/10/23 1200 12/10/23 1207 12/11/23 0329  NA 136 139 140  K 3.1* 3.2* 3.6  CL 104 104 108  CO2 26  --  23  GLUCOSE 87 90 89  BUN 39* 39* 28*  CREATININE 0.96 1.00 0.81  CALCIUM  8.7*  --  8.3*   Recent Labs    12/10/23 1200 12/11/23 0329  PROT 5.1* 5.6*  ALBUMIN 3.4* 3.0*  AST 36 29  ALT 17 16  ALKPHOS 40 43  BILITOT 0.8 1.0      Imaging:  CT CHEST ABDOMEN PELVIS W CONTRAST EXAM: CT CHEST, ABDOMEN AND PELVIS WITH CONTRAST 12/10/2023 01:43:26 PM  TECHNIQUE: CT of the chest, abdomen and pelvis was performed with the administration of intravenous contrast. Multiplanar reformatted images are provided for  review. Automated exposure control, iterative reconstruction, and/or weight based adjustment of the mA/kV was utilized to reduce the radiation dose to as low as reasonably achievable.  COMPARISON: 11/16/2010  CLINICAL HISTORY: Polytrauma, blunt; Multiple falls, pain. Triage notes; Pt arrived by EMS from home. Reports that she has had multi falls over past 1 1/2 weeks. Denies hitting head. No anticoagulant use. Pt has positive recent history of UTI dx, but keflex  bottle found in home was still full. Hydro/acet was empty. Pt c/o ; increased weakness and loss of independence. Lives at home with daughter and other family members. Pt w/hx CVA + L sided weakness. Presents with multiple bruising over entire body predominantly on L hip.  FINDINGS:  CHEST:  MEDIASTINUM: Heart and pericardium are unremarkable. The central airways are clear. Small hiatal hernia.  THORACIC LYMPH NODES: No mediastinal, hilar or axillary lymphadenopathy.  LUNGS AND PLEURA: No focal consolidation or pulmonary edema. No pleural effusion or pneumothorax. Pleural calcifications are identified overlying the left lung, unchanged from the previous exam. Chronic band-like area of scarring is noted within the posterior and medial left lower lobe. Subpleural nodule in the posterior left lung base measures 0.9 x 0.6 cm, new from previous exam.  ABDOMEN AND PELVIS:  LIVER: The  liver is unremarkable.  GALLBLADDER AND BILE DUCTS: Gallbladder is unremarkable. No biliary ductal dilatation.  SPLEEN: No acute abnormality.  PANCREAS: No acute abnormality.  ADRENAL GLANDS: No acute abnormality.  KIDNEYS, URETERS AND BLADDER: No stones in the kidneys or ureters. No hydronephrosis. No perinephric or periureteral stranding. Urinary bladder is unremarkable.  GI AND BOWEL: Stomach demonstrates no acute abnormality. There is no bowel obstruction. The appendix is visualized and appears normal.  REPRODUCTIVE  ORGANS: No acute abnormality.  PERITONEUM AND RETROPERITONEUM: No ascites. No free air.  VASCULATURE: Aortic atherosclerotic calcification. Focal penetrating atherosclerotic ulceration noted along the undersurface of the aortic arch measuring 1.7 x 0.8 cm, sagittal image 89/7. Coronary artery calcifications. The infrarenal abdominal aorta measures 4.4 x 4.4 cm. 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  ABDOMINAL AND PELVIS LYMPH NODES: No lymphadenopathy.  BONES AND SOFT TISSUES: Thoracic degenerative disc disease. There is asymmetric soft tissue edema and subcutaneous fat stranding within the soft tissues overlying the lateral aspect and anterior aspect of the left hip and image portions of the proximal left lower extremity. Overlying skin thickening noted. Mild subcutaneous soft tissue stranding and skin thickening overlies the lateral aspect right hip.  IMPRESSION: 1. No acute findings in the chest, abdomen, and pelvis related to the polytrauma and multiple falls. 2. 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 Follow up in 12 months and referral to vascular surgery. 3. 9x6 mm subpleural nodule within the left base appears smoothly marginated and may represent a benign intrapulmonary lymph node. Initial follow-up examination in 3 to 6 months is recommended. 4. Asymmetric soft tissue edema and subcutaneous fat stranding overlying the lateral and anterior left hip and proximal left lower extremity, with overlying skin thickening. Mild subcutaneous soft tissue stranding and skin thickening overlies the lateral aspect of the right hip. These findings may reflect superficial soft tissue contusion, or anasarca. Clinical correlation advised.  Electronically signed by: Waddell Calk MD 12/10/2023 02:04 PM EDT RP Workstation: [id number] CT Cervical Spine  Wo Contrast CLINICAL DATA:  78 year old female with multiple falls recently. Increased weakness.  EXAM: CT CERVICAL SPINE WITHOUT CONTRAST  TECHNIQUE: Multidetector CT imaging of the cervical spine was performed without intravenous contrast. Multiplanar CT image reconstructions were also generated.  RADIATION DOSE REDUCTION: This exam was performed according to the departmental dose-optimization program which includes automated exposure control, adjustment of the mA and/or kV according to patient size and/or use of iterative reconstruction technique.  COMPARISON:  Head CT today.  CTA head and neck 09/11/2020.  FINDINGS: Alignment: Stable straightening of lordosis. Cervicothoracic junction alignment is within normal limits. Bilateral posterior element alignment is within normal limits.  Skull base and vertebrae: Bone mineralization is within normal limits for age. Visualized skull base is intact. No atlanto-occipital dissociation. C1 and C2 appear intact and aligned. No acute osseous abnormality identified.  Soft tissues and spinal canal: No prevertebral fluid or swelling. No visible canal hematoma. Advanced chronic cervical carotid atherosclerosis. New right side carotid vascular stent since 2022.  Disc levels: Diffuse idiopathic skeletal hyperostosis (DISH). Associated interbody ankylosis C3 through C7, and likely developing also at the cervicothoracic junction. Associated bulky disc and endplate degeneration in the cervical spine, but no CT evidence of spinal stenosis.  Upper chest: Calcified aortic atherosclerosis. Otherwise negative visible noncontrast thoracic inlet.  IMPRESSION: 1. No acute traumatic injury identified in the cervical spine. 2. DISH with  widespread cervical intervertebral ankylosis. 3. Cervical right carotid artery stenting since 2022 CTA. Aortic Atherosclerosis (ICD10-I70.0).  Electronically Signed   By: VEAR Hurst M.D.   On: 12/10/2023 12:35 CT  Head Wo Contrast CLINICAL DATA:  78 year old female with multiple falls recently. Increased weakness.  EXAM: CT HEAD WITHOUT CONTRAST  TECHNIQUE: Contiguous axial images were obtained from the base of the skull through the vertex without intravenous contrast.  RADIATION DOSE REDUCTION: This exam was performed according to the departmental dose-optimization program which includes automated exposure control, adjustment of the mA and/or kV according to patient size and/or use of iterative reconstruction technique.  COMPARISON:  Brain MRI 09/10/2020.  Head CT 09/16/2020.  FINDINGS: Brain: Progressive encephalomalacia posterior right MCA territory where MRI was abnormal in 2022. Ex vacuo ventricular enlargement now. Punctate dystrophic calcification associated there. Resolved small volume intracranial hemorrhage seen on prior CT.  New since that time but circumscribed and chronic appearing lacunar infarct medial right lentiform (series 6, image 14). Contralateral left basal ganglia vascular calcification is stable.  No midline shift, ventriculomegaly, mass effect, evidence of mass lesion, intracranial hemorrhage or evidence of cortically based acute infarction.  Vascular: Calcified atherosclerosis at the skull base. No suspicious intracranial vascular hyperdensity.  Skull: No acute osseous abnormality identified.  Sinuses/Orbits: Substantially progressed bilateral paranasal sinus periosteal thickening compared to the prior CT. Generalized paranasal sinus mucosal thickening is similar. Tympanic cavities and mastoids well aerated. Small fluid levels in the paranasal sinuses.  Other: Disconjugate gaze. No acute orbit or scalp soft tissue finding.  IMPRESSION: 1. No acute intracranial abnormality or acute traumatic injury identified. 2. Progressed chronic ischemic disease since 2022 post ischemic encephalomalacia in the right MCA territory. 3. Acute and chronic appearing  bilateral paranasal sinus inflammation has progressed since 2022.  Electronically Signed   By: VEAR Hurst M.D.   On: 12/10/2023 12:31 DG FEMUR MIN 2 VIEWS LEFT CLINICAL DATA:  [id number] Fall [id number] Pain 144615.  EXAM: LEFT FEMUR 2 VIEWS  COMPARISON:  None Available.  FINDINGS: No acute fracture or dislocation.  No aggressive osseous lesion.  There are mild degenerative changes of the hip joint without significant joint space narrowing. Osteophytosis of the superior acetabulum.  Mild degenerative changes of the knee joint.  No focal soft tissue swelling.  No radiopaque foreign bodies.  IMPRESSION: *No acute osseous abnormality of the left femur.  Electronically Signed   By: Ree Molt M.D.   On: 12/10/2023 11:47 DG Forearm Left CLINICAL DATA:  [id number] Fall [id number] Pain 144615  EXAM: LEFT FOREARM - 2 VIEW  COMPARISON:  None Available.  FINDINGS: There is diffuse osteopenia of the visualized osseous structures.  No acute fracture or dislocation.  No aggressive osseous lesion.  Note is made of negative ulnar variance.  Mild diffuse degenerative changes of imaged joints.  No radiopaque foreign bodies.  Soft tissues are within normal limits.  IMPRESSION: No acute osseous abnormality of the left forearm.  Electronically Signed   By: Ree Molt M.D.   On: 12/10/2023 11:39 DG Shoulder Left CLINICAL DATA:  fall.  EXAM: LEFT SHOULDER - 2+ VIEW  COMPARISON:  None Available.  FINDINGS: No acute fracture or dislocation.  No aggressive osseous lesion.  Glenohumeral and acromioclavicular joints are normal in alignment.  Moderate osteoarthritis of the acromioclavicular joint.  Mild osteoarthritis of the glenohumeral joint.  No soft tissue swelling.  No radiopaque foreign bodies.  IMPRESSION: No acute osseous abnormality of the left shoulder joint.  Electronically Signed  By: Ree Molt M.D.   On: 12/10/2023 11:37 DG Pelvis  1-2 Views CLINICAL DATA:  Fall.  EXAM: PELVIS - 1-2 VIEW  COMPARISON:  None Available.  FINDINGS: Pelvis is intact with normal and symmetric sacroiliac joints.  No acute fracture or dislocation.  No aggressive osseous lesion.  Visualized sacral arcuate lines are unremarkable.  Unremarkable symphysis pubis.  There are mild degenerative changes of bilateral hip joints without significant joint space narrowing. Osteophytosis of the superior acetabulum.  No radiopaque foreign bodies.  IMPRESSION: Negative.  Electronically Signed   By: Ree Molt M.D.   On: 12/10/2023 11:36 DG Hand Complete Left CLINICAL DATA:  Fall.  Pain.  EXAM: LEFT HAND - COMPLETE 3+ VIEW  COMPARISON:  None Available.  FINDINGS: There is diffuse osteopenia of the visualized osseous structures.  No acute fracture or dislocation.  No aggressive osseous lesion.  Mild diffuse degenerative osteoarthritis of imaged joints.  No radiopaque foreign bodies.  Soft tissues are within normal limits.  IMPRESSION: No acute osseous abnormality of the left hand.  Electronically Signed   By: Ree Molt M.D.   On: 12/10/2023 11:36     Scheduled inpatient medications:   amLODipine   10 mg Oral Daily   cyanocobalamin   1,000 mcg Subcutaneous Daily   [START ON 12/14/2023] vitamin B-12  1,000 mcg Oral Daily   pantoprazole   40 mg Oral Daily   rosuvastatin   20 mg Oral Daily   sodium chloride  flush  3 mL Intravenous Q12H   Continuous inpatient infusions:  PRN inpatient medications: acetaminophen  **OR** acetaminophen , hydrALAZINE   Vital signs in last 24 hours: Temp:  [97.5 F (36.4 C)-98.6 F (37 C)] 97.8 F (36.6 C) (08/04 1152) Pulse Rate:  [62-141] 141 (08/04 1152) Resp:  [14-22] 22 (08/04 1152) BP: (109-158)/(48-136) 148/136 (08/04 1149) SpO2:  [89 %-100 %] 99 % (08/04 1152)    Intake/Output Summary (Last 24 hours) at 12/11/2023 1423 Last data filed at 12/11/2023 1100 Gross per 24 hour   Intake 715 ml  Output 750 ml  Net -35 ml    Intake/Output from previous day: 08/03 0701 - 08/04 0700 In: 715 [I.V.:3; Blood:712] Out: 650 [Urine:650] Intake/Output this shift: Total I/O In: -  Out: 100 [Urine:100]   Physical Exam:  General: Alert female in NAD Heart:  Regular rate and rhythm.  Pulmonary: Normal respiratory effort Abdomen: Soft, nondistended, nontender. Normal bowel sounds. Extremities: No lower extremity edema  Neurologic: Alert and oriented Psych: Pleasant. Cooperative     LOS: 0 days   Vina Dasen ,NP 12/11/2023, 2:23 PM

## 2023-12-11 NOTE — Progress Notes (Addendum)
 PROGRESS NOTE                                                                                                                                                                                                             Patient Demographics:    [name], is a 78 y.o. female, DOB - 1945-08-01, FMW:[id number]  Outpatient Primary MD for the patient is Pcp, No    LOS - 0  Admit date - 12/10/2023    Chief Complaint  Patient presents with   Fall   Weakness       Brief Narrative (HPI from H&P)   78 y.o. female with medical history significant of hypertension, hyperlipidemia, CVA, TIA, COPD, DVT, intraventricular hemorrhage presenting after multiple falls at home.  This happened on the day of admission about 4-5 times, in the ER she was found to have severe anemia, head CT was nonacute, CT abdomen pelvis and chest nonacute, she was seen by GI who recommended hematological workup for anemia.   Subjective:    [name] today has, No headache, No chest pain, No abdominal pain - No Nausea, No new weakness tingling or numbness, no shortness of breath   Assessment  & Plan :   Severe symptomatic anemia.  Weakly heme positive brown stool, anemia panel unimpressive except borderline B12 - she has been transfused 2 units of packed RBC, posttransfusion H&H stable, on PPI seen by GI, per GI not likely to be GI source for anemia, requested hematological workup, hematology has been consulted.  Continue workup and monitor, PT OT here.  Multiple falls at home likely due to severe anemia.  PT OT and supportive care for above, CT head nonacute.   Nonspecific changes on CT of infrarenal aortic aneurysm and subpleural left lung nodule.  PCP to monitor needs close monitoring for both.  Dyslipidemia.  On statin.  History of stroke including intracerebral bleed.  On statin continue.  Hypertension.  Resume Norvasc  along with as needed hydralazine   and monitor  Low normal B12.  Replace.      Condition - Extremely Guarded  Family Communication  : Son Auston [phone]  on 12/11/2023 at 10:43 AM x 2.  Going to voicemail which is not set up  Code Status : Full code  Consults  : GI, hematology  PUD Prophylaxis : PPI  Procedures  :     CT head and C-spine.  Nonacute.  CT chest-abdomen pelvis  1. No acute findings in the chest, abdomen, and pelvis related to the polytrauma and multiple falls. 2. 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 Follow up in 12 months and referral to vascular surgery. 3. 9x6 mm subpleural nodule within the left base appears smoothly marginated and may represent a benign intrapulmonary lymph node. Initial follow-up examination in 3 to 6 months is recommended. 4. Asymmetric soft tissue edema and subcutaneous fat stranding overlying the lateral and anterior left hip and proximal left lower extremity, with overlying skin thickening. Mild subcutaneous soft tissue stranding and skin thickening overlies the lateral aspect of the right hip. These findings may reflect superficial soft tissue contusion, or anasarca.      Disposition Plan  :    Status is: Observation  DVT Prophylaxis  :    SCDs Start: 12/10/23 1453    Lab Results  Component Value Date   PLT 80 (L) 12/11/2023    Diet :  Diet Order             Diet full liquid Room service appropriate? No; Fluid consistency: Thin  Diet effective now                    Inpatient Medications  Scheduled Meds:  cyanocobalamin   1,000 mcg Subcutaneous Daily   [START ON 12/14/2023] vitamin B-12  1,000 mcg Oral Daily   rosuvastatin   20 mg Oral Daily   sodium chloride  flush  3 mL Intravenous Q12H   Continuous Infusions: PRN Meds:.acetaminophen  **OR** acetaminophen   Antibiotics  :    Anti-infectives (From admission, onward)    None         Objective:    Vitals:   12/10/23 2316 12/11/23 0405 12/11/23 0653 12/11/23 0829  BP: (!) 130/56 (!) 111/53  (!) 155/67  Pulse: 68 62  90  Resp: 20 18  17   Temp: 98 F (36.7 C) 97.6 F (36.4 C)  98.3 F (36.8 C)  TempSrc: Axillary Oral  Oral  SpO2: 100% 99% (!) 89%   Weight:      Height:        Wt Readings from Last 3 Encounters:  12/10/23 87.1 kg  12/05/23 95.3 kg  05/30/23 95.4 kg     Intake/Output Summary (Last 24 hours) at 12/11/2023 1040 Last data filed at 12/11/2023 0028 Gross per 24 hour  Intake 715 ml  Output 650 ml  Net 65 ml     Physical Exam  Awake Alert, No new F.N deficits, chronic left-sided weakness and facial droop [place].AT,PERRAL Supple Neck, No JVD,   Symmetrical Chest wall movement, Good air movement bilaterally, CTAB RRR,No Gallops,Rubs or new Murmurs,  +ve B.Sounds, Abd Soft, No tenderness,   No Cyanosis, Clubbing or edema        Data Review:    Recent Labs  Lab 12/05/23 1104 12/10/23 1151 12/10/23 1200 12/10/23 1207 12/11/23 0329 12/11/23 0618  WBC 3.2*  --  7.5  --  6.4 7.0  HGB 11.6* 5.4* 5.7* 5.8* 7.6* 8.0*  HCT 36.9 16.0* 17.5* 17.0* 23.0* 23.7*  PLT 45*  --  78*  --  74* 80*  MCV 92.0  --  93.6  --  92.4 91.5  MCH 28.9  --  30.5  --  30.5 30.9  MCHC 31.4  --  32.6  --  33.0 33.8  RDW 13.4  --  14.0  --  13.7 14.2  LYMPHSABS  --   --  1.6  --   --  1.3  MONOABS  --   --  0.4  --   --  0.4  EOSABS  --   --  0.2  --   --  0.2  BASOSABS  --   --  0.0  --   --  0.0    Recent Labs  Lab 12/05/23 1306 12/10/23 1151 12/10/23 1200 12/10/23 1207 12/11/23 0329  NA 140 136 136 139 140  K 3.0* 3.9 3.1* 3.2* 3.6  CL 103 106 104 104 108  CO2 28  --  26  --  23  ANIONGAP 9  --  6  --  9  GLUCOSE 81 92 87 90 89  BUN 22 49* 39* 39* 28*  CREATININE 1.07* 1.00 0.96 1.00 0.81  AST 19  --  36  --  29  ALT 9  --  17  --  16  ALKPHOS 69  --  40  --  43  BILITOT 0.8  --  0.8  --  1.0  ALBUMIN 3.6  --  3.4*  --  3.0*  INR  --   --  1.4*  --   --    MG  --   --  2.0  --   --   CALCIUM  9.2  --  8.7*  --  8.3*      Recent Labs  Lab 12/05/23 1306 12/10/23 1200 12/11/23 0329  INR  --  1.4*  --   MG  --  2.0  --   CALCIUM  9.2 8.7* 8.3*    --------------------------------------------------------------------------------------------------------------- Lab Results  Component Value Date   CHOL 288 (H) 05/30/2023   HDL 78 05/30/2023   LDLCALC 165 (H) 05/30/2023   TRIG 248 (H) 05/30/2023   CHOLHDL 3.7 05/30/2023    Lab Results  Component Value Date   HGBA1C 5.3 09/11/2020   No results for input(s): TSH, T4TOTAL, FREET4, T3FREE, THYROIDAB in the last 72 hours. Recent Labs    12/11/23 0619  VITAMINB12 251  FOLATE 4.8*  FERRITIN 80  TIBC 277  IRON 70  RETICCTPCT 3.1   ------------------------------------------------------------------------------------------------------------------ Cardiac Enzymes No results for input(s): CKMB, TROPONINI, MYOGLOBIN in the last 168 hours.  Invalid input(s): CK  Micro Results No results found for this or any previous visit (from the past 240 hours).  Radiology Report CT CHEST ABDOMEN PELVIS W CONTRAST Result Date: 12/10/2023 EXAM: CT CHEST, ABDOMEN AND PELVIS WITH CONTRAST 12/10/2023 01:43:26 PM TECHNIQUE: CT of the chest, abdomen and pelvis was performed with the administration of intravenous contrast. Multiplanar reformatted images are provided for review. Automated exposure control, iterative reconstruction, and/or weight based adjustment of the mA/kV was utilized to reduce the radiation dose to as low as reasonably achievable. COMPARISON: 11/16/2010 CLINICAL HISTORY: Polytrauma, blunt; Multiple falls, pain. Triage notes; Pt arrived by EMS from home. Reports that she has had multi falls over past 1 1/2 weeks. Denies hitting head. No anticoagulant use. Pt has positive recent history of UTI dx, but keflex  bottle found in home was still full. Hydro/acet was empty. Pt c/o ;  increased weakness and loss of independence. Lives at home with daughter and other family members. Pt w/hx CVA + L sided weakness. Presents with multiple bruising over entire body predominantly on L hip. FINDINGS: CHEST: MEDIASTINUM: Heart and pericardium are unremarkable. The central airways are  clear. Small hiatal hernia. THORACIC LYMPH NODES: No mediastinal, hilar or axillary lymphadenopathy. LUNGS AND PLEURA: No focal consolidation or pulmonary edema. No pleural effusion or pneumothorax. Pleural calcifications are identified overlying the left lung, unchanged from the previous exam. Chronic band-like area of scarring is noted within the posterior and medial left lower lobe. Subpleural nodule in the posterior left lung base measures 0.9 x 0.6 cm, new from previous exam. ABDOMEN AND PELVIS: LIVER: The liver is unremarkable. GALLBLADDER AND BILE DUCTS: Gallbladder is unremarkable. No biliary ductal dilatation. SPLEEN: No acute abnormality. PANCREAS: No acute abnormality. ADRENAL GLANDS: No acute abnormality. KIDNEYS, URETERS AND BLADDER: No stones in the kidneys or ureters. No hydronephrosis. No perinephric or periureteral stranding. Urinary bladder is unremarkable. GI AND BOWEL: Stomach demonstrates no acute abnormality. There is no bowel obstruction. The appendix is visualized and appears normal. REPRODUCTIVE ORGANS: No acute abnormality. PERITONEUM AND RETROPERITONEUM: No ascites. No free air. VASCULATURE: Aortic atherosclerotic calcification. Focal penetrating atherosclerotic ulceration noted along the undersurface of the aortic arch measuring 1.7 x 0.8 cm, sagittal image 89/7. Coronary artery calcifications. The infrarenal abdominal aorta measures 4.4 x 4.4 cm. 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 ABDOMINAL AND PELVIS LYMPH NODES: No lymphadenopathy. BONES AND SOFT TISSUES: Thoracic degenerative disc disease. There is asymmetric soft tissue edema and subcutaneous  fat stranding within the soft tissues overlying the lateral aspect and anterior aspect of the left hip and image portions of the proximal left lower extremity. Overlying skin thickening noted. Mild subcutaneous soft tissue stranding and skin thickening overlies the lateral aspect right hip. IMPRESSION: 1. No acute findings in the chest, abdomen, and pelvis related to the polytrauma and multiple falls. 2. 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 Follow up in 12 months and referral to vascular surgery. 3. 9x6 mm subpleural nodule within the left base appears smoothly marginated and may represent a benign intrapulmonary lymph node. Initial follow-up examination in 3 to 6 months is recommended. 4. Asymmetric soft tissue edema and subcutaneous fat stranding overlying the lateral and anterior left hip and proximal left lower extremity, with overlying skin thickening. Mild subcutaneous soft tissue stranding and skin thickening overlies the lateral aspect of the right hip. These findings may reflect superficial soft tissue contusion, or anasarca. Clinical correlation advised. Electronically signed by: Waddell Calk MD 12/10/2023 02:04 PM EDT RP Workstation: [id number]   CT Cervical Spine Wo Contrast Result Date: 12/10/2023 CLINICAL DATA:  78 year old female with multiple falls recently. Increased weakness. EXAM: CT CERVICAL SPINE WITHOUT CONTRAST TECHNIQUE: Multidetector CT imaging of the cervical spine was performed without intravenous contrast. Multiplanar CT image reconstructions were also generated. RADIATION DOSE REDUCTION: This exam was performed according to the departmental dose-optimization program which includes automated exposure control, adjustment of the mA and/or kV according to patient size and/or use of iterative reconstruction technique. COMPARISON:  Head CT today.  CTA head and neck 09/11/2020. FINDINGS: Alignment:  Stable straightening of lordosis. Cervicothoracic junction alignment is within normal limits. Bilateral posterior element alignment is within normal limits. Skull base and vertebrae: Bone mineralization is within normal limits for age. Visualized skull base is intact. No atlanto-occipital dissociation. C1 and C2 appear intact and aligned. No acute osseous abnormality identified. Soft tissues and spinal canal: No prevertebral fluid or swelling. No visible canal hematoma. Advanced chronic cervical carotid atherosclerosis. New right side carotid vascular stent since 2022. Disc levels: Diffuse idiopathic  skeletal hyperostosis (DISH). Associated interbody ankylosis C3 through C7, and likely developing also at the cervicothoracic junction. Associated bulky disc and endplate degeneration in the cervical spine, but no CT evidence of spinal stenosis. Upper chest: Calcified aortic atherosclerosis. Otherwise negative visible noncontrast thoracic inlet. IMPRESSION: 1. No acute traumatic injury identified in the cervical spine. 2. DISH with widespread cervical intervertebral ankylosis. 3. Cervical right carotid artery stenting since 2022 CTA. Aortic Atherosclerosis (ICD10-I70.0). Electronically Signed   By: VEAR Hurst M.D.   On: 12/10/2023 12:35   CT Head Wo Contrast Result Date: 12/10/2023 CLINICAL DATA:  78 year old female with multiple falls recently. Increased weakness. EXAM: CT HEAD WITHOUT CONTRAST TECHNIQUE: Contiguous axial images were obtained from the base of the skull through the vertex without intravenous contrast. RADIATION DOSE REDUCTION: This exam was performed according to the departmental dose-optimization program which includes automated exposure control, adjustment of the mA and/or kV according to patient size and/or use of iterative reconstruction technique. COMPARISON:  Brain MRI 09/10/2020.  Head CT 09/16/2020. FINDINGS: Brain: Progressive encephalomalacia posterior right MCA territory where MRI was abnormal  in 2022. Ex vacuo ventricular enlargement now. Punctate dystrophic calcification associated there. Resolved small volume intracranial hemorrhage seen on prior CT. New since that time but circumscribed and chronic appearing lacunar infarct medial right lentiform (series 6, image 14). Contralateral left basal ganglia vascular calcification is stable. No midline shift, ventriculomegaly, mass effect, evidence of mass lesion, intracranial hemorrhage or evidence of cortically based acute infarction. Vascular: Calcified atherosclerosis at the skull base. No suspicious intracranial vascular hyperdensity. Skull: No acute osseous abnormality identified. Sinuses/Orbits: Substantially progressed bilateral paranasal sinus periosteal thickening compared to the prior CT. Generalized paranasal sinus mucosal thickening is similar. Tympanic cavities and mastoids well aerated. Small fluid levels in the paranasal sinuses. Other: Disconjugate gaze. No acute orbit or scalp soft tissue finding. IMPRESSION: 1. No acute intracranial abnormality or acute traumatic injury identified. 2. Progressed chronic ischemic disease since 2022 post ischemic encephalomalacia in the right MCA territory. 3. Acute and chronic appearing bilateral paranasal sinus inflammation has progressed since 2022. Electronically Signed   By: VEAR Hurst M.D.   On: 12/10/2023 12:31   DG FEMUR MIN 2 VIEWS LEFT Result Date: 12/10/2023 CLINICAL DATA:  [id number] Fall [id number] Pain 144615. EXAM: LEFT FEMUR 2 VIEWS COMPARISON:  None Available. FINDINGS: No acute fracture or dislocation. No aggressive osseous lesion. There are mild degenerative changes of the hip joint without significant joint space narrowing. Osteophytosis of the superior acetabulum. Mild degenerative changes of the knee joint. No focal soft tissue swelling. No radiopaque foreign bodies. IMPRESSION: *No acute osseous abnormality of the left femur. Electronically Signed   By: Ree Molt M.D.   On:  12/10/2023 11:47   DG Forearm Left Result Date: 12/10/2023 CLINICAL DATA:  [id number] Fall [id number] Pain 144615 EXAM: LEFT FOREARM - 2 VIEW COMPARISON:  None Available. FINDINGS: There is diffuse osteopenia of the visualized osseous structures. No acute fracture or dislocation. No aggressive osseous lesion. Note is made of negative ulnar variance. Mild diffuse degenerative changes of imaged joints. No radiopaque foreign bodies. Soft tissues are within normal limits. IMPRESSION: No acute osseous abnormality of the left forearm. Electronically Signed   By: Ree Molt M.D.   On: 12/10/2023 11:39   DG Shoulder Left Result Date: 12/10/2023 CLINICAL DATA:  fall. EXAM: LEFT SHOULDER - 2+ VIEW COMPARISON:  None Available. FINDINGS: No acute fracture or dislocation. No aggressive osseous lesion. Glenohumeral and acromioclavicular joints are normal in alignment.  Moderate osteoarthritis of the acromioclavicular joint. Mild osteoarthritis of the glenohumeral joint. No soft tissue swelling. No radiopaque foreign bodies. IMPRESSION: No acute osseous abnormality of the left shoulder joint. Electronically Signed   By: Ree Molt M.D.   On: 12/10/2023 11:37   DG Pelvis 1-2 Views Result Date: 12/10/2023 CLINICAL DATA:  Fall. EXAM: PELVIS - 1-2 VIEW COMPARISON:  None Available. FINDINGS: Pelvis is intact with normal and symmetric sacroiliac joints. No acute fracture or dislocation. No aggressive osseous lesion. Visualized sacral arcuate lines are unremarkable. Unremarkable symphysis pubis. There are mild degenerative changes of bilateral hip joints without significant joint space narrowing. Osteophytosis of the superior acetabulum. No radiopaque foreign bodies. IMPRESSION: Negative. Electronically Signed   By: Ree Molt M.D.   On: 12/10/2023 11:36   DG Hand Complete Left Result Date: 12/10/2023 CLINICAL DATA:  Fall.  Pain. EXAM: LEFT HAND - COMPLETE 3+ VIEW COMPARISON:  None Available. FINDINGS: There is diffuse  osteopenia of the visualized osseous structures. No acute fracture or dislocation. No aggressive osseous lesion. Mild diffuse degenerative osteoarthritis of imaged joints. No radiopaque foreign bodies. Soft tissues are within normal limits. IMPRESSION: No acute osseous abnormality of the left hand. Electronically Signed   By: Ree Molt M.D.   On: 12/10/2023 11:36     Signature  -   Lavada Stank M.D on 12/11/2023 at 10:40 AM   -  To page go to [email or web address]

## 2023-12-12 DIAGNOSIS — K922 Gastrointestinal hemorrhage, unspecified: Secondary | ICD-10-CM | POA: Diagnosis not present

## 2023-12-12 LAB — CBC WITH DIFFERENTIAL/PLATELET
Abs Immature Granulocytes: 0.05 K/uL (ref 0.00–0.07)
Basophils Absolute: 0 K/uL (ref 0.0–0.1)
Basophils Relative: 0 %
Eosinophils Absolute: 0.2 K/uL (ref 0.0–0.5)
Eosinophils Relative: 4 %
HCT: 22.7 % — ABNORMAL LOW (ref 36.0–46.0)
Hemoglobin: 7.5 g/dL — ABNORMAL LOW (ref 12.0–15.0)
Immature Granulocytes: 1 %
Lymphocytes Relative: 22 %
Lymphs Abs: 1.2 K/uL (ref 0.7–4.0)
MCH: 30.9 pg (ref 26.0–34.0)
MCHC: 33 g/dL (ref 30.0–36.0)
MCV: 93.4 fL (ref 80.0–100.0)
Monocytes Absolute: 0.4 K/uL (ref 0.1–1.0)
Monocytes Relative: 8 %
Neutro Abs: 3.6 K/uL (ref 1.7–7.7)
Neutrophils Relative %: 65 %
Platelets: 100 K/uL — ABNORMAL LOW (ref 150–400)
RBC: 2.43 MIL/uL — ABNORMAL LOW (ref 3.87–5.11)
RDW: 14.5 % (ref 11.5–15.5)
WBC: 5.5 K/uL (ref 4.0–10.5)
nRBC: 0.5 % — ABNORMAL HIGH (ref 0.0–0.2)

## 2023-12-12 MED ORDER — HYDRALAZINE HCL 50 MG PO TABS
50.0000 mg | ORAL_TABLET | Freq: Three times a day (TID) | ORAL | Status: DC
  Administered 2023-12-12 – 2023-12-15 (×9): 50 mg via ORAL
  Filled 2023-12-12 (×10): qty 1

## 2023-12-12 NOTE — Progress Notes (Signed)
 Patient not seen in person today however chart reviewed  Discussed with Dr. Lanny the plan for bone marrow biopsy. Trace heme positive stool is noted and if the bone marrow biopsy fails to reveal a cause for her anemia then patient can follow-up with GI as an outpatient for consideration of endoscopic evaluation at that time.  Dr. Lanny in agreement with this plan.  B12 and folate being replaced Iron studies normal not consistent with a chronic GI blood loss anemia  Will sign off but remain available, call if questions

## 2023-12-12 NOTE — Plan of Care (Signed)

## 2023-12-12 NOTE — TOC Initial Note (Addendum)
 Transition of Care [facility]) - Initial/Assessment Note    Patient Details  Name: [name] MRN: [id number] Date of Birth: [birth year]  [facility] of [facility]) CM/SW Contact:    [name], LCSW Phone Number: 12/12/2023, 9:07 AM  Clinical Narrative:                 9am-CSW left voicemail for patient's son, Auston, to discuss home supports and SNF recommendation.   Patient sleeping. CSW will attempt to see her again this afternoon.   Expected Discharge Plan: Skilled Nursing Facility Barriers to Discharge: Continued Medical Work up, English as a second language teacher   Patient Goals and CMS Choice            Expected Discharge Plan and Services In-house Referral: Clinical Social Work     Living arrangements for the past 2 months: Single Family Home                                      Prior Living Arrangements/Services Living arrangements for the past 2 months: Single Family Home Lives with:: Adult Children Patient language and need for interpreter reviewed:: Yes Do you feel safe going back to the place where you live?: Yes      Need for Family Participation in Patient Care: Yes (Comment) Care giver support system in place?: Yes (comment)   Criminal Activity/Legal Involvement Pertinent to Current Situation/Hospitalization: No - Comment as needed  Activities of Daily Living      Permission Sought/Granted Permission sought to share information with : Family Supports, Oceanographer granted to share information with : Yes, Verbal Permission Granted  Share Information with NAME: Rosabel Auston -Son [phone]           Emotional Assessment Appearance:: Appears stated age     Orientation: : Oriented to Self, Oriented to Place, Oriented to Situation Alcohol / Substance Use: Not Applicable Psych Involvement: No (comment)  Admission diagnosis:  Acute GI bleeding [K92.2] Gastrointestinal hemorrhage, unspecified gastrointestinal  hemorrhage type [K92.2] Anemia, unspecified type [D64.9] Patient Active Problem List   Diagnosis Date Noted   Anemia 12/11/2023   Low folate 12/11/2023   Acute GI bleeding 12/10/2023   Right shoulder pain    Acute blood loss anemia    Essential hypertension    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   Intraventricular hemorrhage (HCC) 09/16/2020   History of TIA (transient ischemic attack) and stroke 09/16/2020   History of COPD    Dyslipidemia    PCP:  Pcp, No Pharmacy:   [id number] - [place], [place] - [address] AT [address] [place]  [address] Phone: [phone] Fax: [phone]     Social Drivers of Health (SDOH) Social History: SDOH Screenings   Depression (PHQ2-9): Low Risk  (06/04/2021)  Tobacco Use: Medium Risk (12/10/2023)   SDOH Interventions:     Readmission Risk Interventions     No data to display

## 2023-12-12 NOTE — Progress Notes (Addendum)
 Physical Therapy Treatment Patient Details Name: [name] MRN: [id number] DOB: 10-10-1945 Today's Date: 12/12/2023   History of Present Illness Pt is a 78 y/o female presenting 8/3 after suffering 4-5 falls a day for the past couple of days.  Work up included anemia with Hgb in the 5's, now 8.1 with 2 units of blood.  PMHx:  HTN, HLD, CVA, CAPD, DVT, IVH    PT Comments  Pt still relatively labile about her living situation and about need to get in touch with her son.  Pt is slowly progressing toward goals, limited by L sided weakness and incoordination.  Emphasis on pre mobility warm up exercise for U and LE's, rolling, and transitions up from L UE, sitting balance, 4 STS trials with max of 2 persons with RW and pivot to the chair with RW assist and max A of 2.    If plan is discharge home, recommend the following: A lot of help with walking and/or transfers;A lot of help with bathing/dressing/bathroom;Assistance with cooking/housework;Assist for transportation   Can travel by private vehicle     No  Equipment Recommendations   (TBD)    Recommendations for Other Services OT consult     Precautions / Restrictions Precautions Precautions: Fall Recall of Precautions/Restrictions: Intact     Mobility  Bed Mobility Overal bed mobility: Needs Assistance Bed Mobility: Rolling, Sidelying to Sit Rolling: Mod assist, +2 for physical assistance Sidelying to sit: Max assist, +2 for safety/equipment       General bed mobility comments: cues for sequencing, assist of both LE's  and truncal assist.  Pt used the rail to assist rolling.  Little ability to assist up from L elbow.    Transfers   Equipment used: 1 person hand held assist (face to face) Transfers: Sit to/from Stand Sit to Stand: Mod assist           General transfer comment: 4 standing trials at EOB, all with RW assist, weak stand pivot in the RW  ended abruptly with need to bring chair to the patient     Ambulation/Gait               General Gait Details: not able   Stairs             Wheelchair Mobility     Tilt Bed    Modified Rankin (Stroke Patients Only)       Balance Overall balance assessment: Needs assistance Sitting-balance support: Single extremity supported, Bilateral upper extremity supported, Feet supported Sitting balance-Leahy Scale: Fair Sitting balance - Comments: posterior and left bias when balance challenged   Standing balance support: Single extremity supported, Bilateral upper extremity supported, During functional activity Standing balance-Leahy Scale: Poor                              Communication Communication Communication: No apparent difficulties Factors Affecting Communication: Reduced clarity of speech  Cognition Arousal: Alert Behavior During Therapy: Lability, Flat affect (crying frequently about her living conditions.)   PT - Cognitive impairments: No family/caregiver present to determine baseline                         Following commands: Intact      Cueing    Exercises Other Exercises Other Exercises: warm up exercise prior to mobility.  Increased tone L U and LE    General Comments General comments (skin integrity, edema,  etc.): pt very upset to get in touch with her son.      Pertinent Vitals/Pain Pain Assessment Pain Assessment: Faces Faces Pain Scale: No hurt Pain Intervention(s): Monitored during session    Home Living Family/patient expects to be discharged to:: Private residence Living Arrangements: Children;Other relatives (lives in grandson's apt, also with one of her dtrs.) Available Help at Discharge: Available PRN/intermittently Type of Home: Apartment Home Access: Level entry     Alternate Level Stairs-Number of Steps: flight Home Layout: Two level;Able to live on main level with bedroom/bathroom Home Equipment: Rolling Walker (2 wheels)      Prior Function             PT Goals (current goals can now be found in the care plan section) Acute Rehab PT Goals Patient Stated Goal: pt hopes to get a into a better situation or get a better to function in her p PT Goal Formulation: With patient Time For Goal Achievement: 12/25/23 Potential to Achieve Goals: Fair Progress towards PT goals: Progressing toward goals    Frequency    Min 3X/week      PT Plan      Co-evaluation              AM-PAC PT 6 Clicks Mobility   Outcome Measure  Help needed turning from your back to your side while in a flat bed without using bedrails?: A Lot Help needed moving from lying on your back to sitting on the side of a flat bed without using bedrails?: A Lot Help needed moving to and from a bed to a chair (including a wheelchair)?: A Lot Help needed standing up from a chair using your arms (e.g., wheelchair or bedside chair)?: Total Help needed to walk in hospital room?: Total Help needed climbing 3-5 steps with a railing? : Total 6 Click Score: 9    End of Session   Activity Tolerance: Patient tolerated treatment well;Patient limited by fatigue   Nurse Communication: Mobility status PT Visit Diagnosis: Other abnormalities of gait and mobility (R26.89);Repeated falls (R29.6);Muscle weakness (generalized) (M62.81);Difficulty in walking, not elsewhere classified (R26.2)     Time: 8778-8740 PT Time Calculation (min) (ACUTE ONLY): 38 min  Charges:    $Therapeutic Exercise: 8-22 mins $Therapeutic Activity: 8-22 mins $Neuromuscular Re-education: 8-22 mins PT General Charges $$ ACUTE PT VISIT: 1 Visit                     12/12/2023  [place] HERO., PT Acute Rehabilitation Services [phone]  (office)   Vinie GAILS Farha Dano 12/12/2023, 1:40 PM

## 2023-12-12 NOTE — Consult Note (Signed)
 Chief Complaint: Normocytic anemia, thrombocytopenia - IR consulted for image guided bone marrow biopsy and aspiration  Referring Provider(s): Lanny Callander, MD   Supervising Physician: Jennefer Rover  Patient Status: [facility] - In-pt  History of Present Illness: [name] is a 78 y.o. female with pmhx prior CVA, COPD, prior DVT, hypertension.  Patient initially presented to hospital and admitted on 12/10/23 with complaint of multiple falls.  Lab work initially showed acute anemia.  Initial hemoglobin 5.4, status post PRBC transfusions, up to 8.0 now.  Fecal occult blood positive.  GI evaluated with no plan for further endoscopic evaluation.  Given acute anemia and thrombocytopenia, hematology evaluated patient and requesting IR consult for image guided bone marrow biopsy and aspiration.  Patient with no complaints today, resting comfortably in bed.  Due to limitations with pathology availability, plan to tentatively proceed with procedure tomorrow 12/13/2023.   Patient is Full Code  Past Medical History:  Diagnosis Date   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 (HCC) 09/16/2020   COPD (chronic obstructive pulmonary disease) (HCC)    History of DVT (deep vein thrombosis)    Hypertension    Thoracoabdominal aortic aneurysm (HCC)    TIA (transient ischemic attack) 09/10/2020    Past Surgical History:  Procedure Laterality Date   IR CT HEAD LTD  09/11/2020   IR INTRAVSC STENT CERV CAROTID W/O EMB-PROT MOD SED INC ANGIO  09/11/2020   IR PERCUTANEOUS ART THROMBECTOMY/INFUSION INTRACRANIAL INC DIAG ANGIO  09/11/2020       IR PERCUTANEOUS ART THROMBECTOMY/INFUSION INTRACRANIAL INC DIAG ANGIO  09/11/2020   IR US  GUIDE VASC ACCESS RIGHT  09/11/2020   NO PAST SURGERIES     RADIOLOGY WITH ANESTHESIA N/A 09/11/2020   Procedure: IR WITH ANESTHESIA;  Surgeon: Dolphus Carrion, MD;  Location: [facility] OR;  Service: Radiology;  Laterality: N/A;     Allergies: Patient has no known allergies.  Medications: Prior to Admission medications   Medication Sig Start Date End Date Taking? Authorizing Provider  amLODipine  (NORVASC ) 5 MG tablet TAKE 1 TABLET(5 MG) BY MOUTH DAILY Patient taking differently: Take 5 mg by mouth daily. 08/23/23   Lorren Greig PARAS, NP  aspirin  EC 81 MG EC tablet Take 1 tablet (81 mg total) by mouth daily. Swallow whole. Patient not taking: Reported on 05/30/2023 09/17/20   Vernon Ranks, MD  cephALEXin  (KEFLEX ) 500 MG capsule Take 1 capsule (500 mg total) by mouth 4 (four) times daily. 12/05/23   Minnie Tinnie BRAVO, PA  HYDROcodone -acetaminophen  (NORCO/VICODIN) 5-325 MG tablet Take 1 tablet by mouth every 6 (six) hours as needed for severe pain (pain score 7-10). 12/05/23   Geiple, Joshua, PA-C  predniSONE  (DELTASONE ) 20 MG tablet 3 Tabs PO Days 1-3, then 2 tabs PO Days 4-6, then 1 tab PO Day 7-9, then Half Tab PO Day 10-12 12/05/23   Geiple, Joshua, PA-C  rosuvastatin  (CRESTOR ) 20 MG tablet TAKE 1 TABLET(20 MG) BY MOUTH DAILY Patient taking differently: Take 20 mg by mouth daily. 08/23/23   Lorren Greig PARAS, NP  valsartan  (DIOVAN ) 40 MG tablet Take 1 tablet (40 mg total) by mouth daily. 05/30/23   Lorren Greig PARAS, NP     Family History  Problem Relation Age of Onset   Hypertension Mother     Social History   Socioeconomic History   Marital status: Single    Spouse name: Not on file   Number of children: Not on file   Years of education: Not on  file   Highest education level: Not on file  Occupational History   Not on file  Tobacco Use   Smoking status: Former    Current packs/day: 0.00    Types: Cigarettes    Quit date: 05/10/1987    Years since quitting: 36.6   Smokeless tobacco: Never  Vaping Use   Vaping status: Never Used  Substance and Sexual Activity   Alcohol use: Not Currently   Drug use: Never   Sexual activity: Not Currently    Birth control/protection: None  Other Topics Concern   Not on file   Social History Narrative   Not on file   Social Drivers of Health   Financial Resource Strain: Not on file  Food Insecurity: Not on file  Transportation Needs: Not on file  Physical Activity: Not on file  Stress: Not on file  Social Connections: Not on file     Review of Systems: A 12 point ROS discussed and pertinent positives are indicated in the HPI above.  All other systems are negative.    Vital Signs: BP (!) 134/58 (BP Location: Left Arm)   Pulse 61   Temp 97.6 F (36.4 C) (Oral)   Resp 14   Ht 5' 10 (1.778 m)   Wt 192 lb (87.1 kg)   SpO2 98%   BMI 27.55 kg/m   Advance Care Plan: The advanced care place/surrogate decision maker was discussed at the time of visit and the patient did not wish to discuss or was not able to name a surrogate decision maker or provide an advance care plan.  Physical Exam Vitals and nursing note reviewed.  Constitutional:      Appearance: Normal appearance.  HENT:     Mouth/Throat:     Mouth: Mucous membranes are moist.     Pharynx: Oropharynx is clear.  Cardiovascular:     Rate and Rhythm: Normal rate and regular rhythm.  Pulmonary:     Effort: Pulmonary effort is normal.     Breath sounds: Normal breath sounds.  Abdominal:     Palpations: Abdomen is soft.     Tenderness: There is no abdominal tenderness.  Musculoskeletal:     Right lower leg: No edema.     Left lower leg: No edema.  Skin:    General: Skin is warm and dry.  Neurological:     Mental Status: She is alert and oriented to person, place, and time. Mental status is at baseline.     Imaging: CT CHEST ABDOMEN PELVIS W CONTRAST Result Date: 12/10/2023 EXAM: CT CHEST, ABDOMEN AND PELVIS WITH CONTRAST 12/10/2023 01:43:26 PM TECHNIQUE: CT of the chest, abdomen and pelvis was performed with the administration of intravenous contrast. Multiplanar reformatted images are provided for review. Automated exposure control, iterative reconstruction, and/or weight based  adjustment of the mA/kV was utilized to reduce the radiation dose to as low as reasonably achievable. COMPARISON: 11/16/2010 CLINICAL HISTORY: Polytrauma, blunt; Multiple falls, pain. Triage notes; Pt arrived by EMS from home. Reports that she has had multi falls over past 1 1/2 weeks. Denies hitting head. No anticoagulant use. Pt has positive recent history of UTI dx, but keflex  bottle found in home was still full. Hydro/acet was empty. Pt c/o ; increased weakness and loss of independence. Lives at home with daughter and other family members. Pt w/hx CVA + L sided weakness. Presents with multiple bruising over entire body predominantly on L hip. FINDINGS: CHEST: MEDIASTINUM: Heart and pericardium are unremarkable. The central airways are  clear. Small hiatal hernia. THORACIC LYMPH NODES: No mediastinal, hilar or axillary lymphadenopathy. LUNGS AND PLEURA: No focal consolidation or pulmonary edema. No pleural effusion or pneumothorax. Pleural calcifications are identified overlying the left lung, unchanged from the previous exam. Chronic band-like area of scarring is noted within the posterior and medial left lower lobe. Subpleural nodule in the posterior left lung base measures 0.9 x 0.6 cm, new from previous exam. ABDOMEN AND PELVIS: LIVER: The liver is unremarkable. GALLBLADDER AND BILE DUCTS: Gallbladder is unremarkable. No biliary ductal dilatation. SPLEEN: No acute abnormality. PANCREAS: No acute abnormality. ADRENAL GLANDS: No acute abnormality. KIDNEYS, URETERS AND BLADDER: No stones in the kidneys or ureters. No hydronephrosis. No perinephric or periureteral stranding. Urinary bladder is unremarkable. GI AND BOWEL: Stomach demonstrates no acute abnormality. There is no bowel obstruction. The appendix is visualized and appears normal. REPRODUCTIVE ORGANS: No acute abnormality. PERITONEUM AND RETROPERITONEUM: No ascites. No free air. VASCULATURE: Aortic atherosclerotic calcification. Focal penetrating  atherosclerotic ulceration noted along the undersurface of the aortic arch measuring 1.7 x 0.8 cm, sagittal image 89/7. Coronary artery calcifications. The infrarenal abdominal aorta measures 4.4 x 4.4 cm. 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 ABDOMINAL AND PELVIS LYMPH NODES: No lymphadenopathy. BONES AND SOFT TISSUES: Thoracic degenerative disc disease. There is asymmetric soft tissue edema and subcutaneous fat stranding within the soft tissues overlying the lateral aspect and anterior aspect of the left hip and image portions of the proximal left lower extremity. Overlying skin thickening noted. Mild subcutaneous soft tissue stranding and skin thickening overlies the lateral aspect right hip. IMPRESSION: 1. No acute findings in the chest, abdomen, and pelvis related to the polytrauma and multiple falls. 2. 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 Follow up in 12 months and referral to vascular surgery. 3. 9x6 mm subpleural nodule within the left base appears smoothly marginated and may represent a benign intrapulmonary lymph node. Initial follow-up examination in 3 to 6 months is recommended. 4. Asymmetric soft tissue edema and subcutaneous fat stranding overlying the lateral and anterior left hip and proximal left lower extremity, with overlying skin thickening. Mild subcutaneous soft tissue stranding and skin thickening overlies the lateral aspect of the right hip. These findings may reflect superficial soft tissue contusion, or anasarca. Clinical correlation advised. Electronically signed by: Waddell Calk MD 12/10/2023 02:04 PM EDT RP Workstation: [id number]   CT Cervical Spine Wo Contrast Result Date: 12/10/2023 CLINICAL DATA:  78 year old female with multiple falls recently. Increased weakness. EXAM: CT CERVICAL SPINE WITHOUT CONTRAST TECHNIQUE: Multidetector CT imaging  of the cervical spine was performed without intravenous contrast. Multiplanar CT image reconstructions were also generated. RADIATION DOSE REDUCTION: This exam was performed according to the departmental dose-optimization program which includes automated exposure control, adjustment of the mA and/or kV according to patient size and/or use of iterative reconstruction technique. COMPARISON:  Head CT today.  CTA head and neck 09/11/2020. FINDINGS: Alignment: Stable straightening of lordosis. Cervicothoracic junction alignment is within normal limits. Bilateral posterior element alignment is within normal limits. Skull base and vertebrae: Bone mineralization is within normal limits for age. Visualized skull base is intact. No atlanto-occipital dissociation. C1 and C2 appear intact and aligned. No acute osseous abnormality identified. Soft tissues and spinal canal: No prevertebral fluid or swelling. No visible canal hematoma. Advanced chronic cervical carotid atherosclerosis. New right side carotid vascular stent since 2022. Disc levels: Diffuse idiopathic  skeletal hyperostosis (DISH). Associated interbody ankylosis C3 through C7, and likely developing also at the cervicothoracic junction. Associated bulky disc and endplate degeneration in the cervical spine, but no CT evidence of spinal stenosis. Upper chest: Calcified aortic atherosclerosis. Otherwise negative visible noncontrast thoracic inlet. IMPRESSION: 1. No acute traumatic injury identified in the cervical spine. 2. DISH with widespread cervical intervertebral ankylosis. 3. Cervical right carotid artery stenting since 2022 CTA. Aortic Atherosclerosis (ICD10-I70.0). Electronically Signed   By: VEAR Hurst M.D.   On: 12/10/2023 12:35   CT Head Wo Contrast Result Date: 12/10/2023 CLINICAL DATA:  78 year old female with multiple falls recently. Increased weakness. EXAM: CT HEAD WITHOUT CONTRAST TECHNIQUE: Contiguous axial images were obtained from the base of the skull  through the vertex without intravenous contrast. RADIATION DOSE REDUCTION: This exam was performed according to the departmental dose-optimization program which includes automated exposure control, adjustment of the mA and/or kV according to patient size and/or use of iterative reconstruction technique. COMPARISON:  Brain MRI 09/10/2020.  Head CT 09/16/2020. FINDINGS: Brain: Progressive encephalomalacia posterior right MCA territory where MRI was abnormal in 2022. Ex vacuo ventricular enlargement now. Punctate dystrophic calcification associated there. Resolved small volume intracranial hemorrhage seen on prior CT. New since that time but circumscribed and chronic appearing lacunar infarct medial right lentiform (series 6, image 14). Contralateral left basal ganglia vascular calcification is stable. No midline shift, ventriculomegaly, mass effect, evidence of mass lesion, intracranial hemorrhage or evidence of cortically based acute infarction. Vascular: Calcified atherosclerosis at the skull base. No suspicious intracranial vascular hyperdensity. Skull: No acute osseous abnormality identified. Sinuses/Orbits: Substantially progressed bilateral paranasal sinus periosteal thickening compared to the prior CT. Generalized paranasal sinus mucosal thickening is similar. Tympanic cavities and mastoids well aerated. Small fluid levels in the paranasal sinuses. Other: Disconjugate gaze. No acute orbit or scalp soft tissue finding. IMPRESSION: 1. No acute intracranial abnormality or acute traumatic injury identified. 2. Progressed chronic ischemic disease since 2022 post ischemic encephalomalacia in the right MCA territory. 3. Acute and chronic appearing bilateral paranasal sinus inflammation has progressed since 2022. Electronically Signed   By: VEAR Hurst M.D.   On: 12/10/2023 12:31   DG FEMUR MIN 2 VIEWS LEFT Result Date: 12/10/2023 CLINICAL DATA:  809823 Fall [id number] Pain 144615. EXAM: LEFT FEMUR 2 VIEWS COMPARISON:   None Available. FINDINGS: No acute fracture or dislocation. No aggressive osseous lesion. There are mild degenerative changes of the hip joint without significant joint space narrowing. Osteophytosis of the superior acetabulum. Mild degenerative changes of the knee joint. No focal soft tissue swelling. No radiopaque foreign bodies. IMPRESSION: *No acute osseous abnormality of the left femur. Electronically Signed   By: Ree Molt M.D.   On: 12/10/2023 11:47   DG Forearm Left Result Date: 12/10/2023 CLINICAL DATA:  809823 Fall [id number] Pain 144615 EXAM: LEFT FOREARM - 2 VIEW COMPARISON:  None Available. FINDINGS: There is diffuse osteopenia of the visualized osseous structures. No acute fracture or dislocation. No aggressive osseous lesion. Note is made of negative ulnar variance. Mild diffuse degenerative changes of imaged joints. No radiopaque foreign bodies. Soft tissues are within normal limits. IMPRESSION: No acute osseous abnormality of the left forearm. Electronically Signed   By: Ree Molt M.D.   On: 12/10/2023 11:39   DG Shoulder Left Result Date: 12/10/2023 CLINICAL DATA:  fall. EXAM: LEFT SHOULDER - 2+ VIEW COMPARISON:  None Available. FINDINGS: No acute fracture or dislocation. No aggressive osseous lesion. Glenohumeral and acromioclavicular joints are normal in alignment.  Moderate osteoarthritis of the acromioclavicular joint. Mild osteoarthritis of the glenohumeral joint. No soft tissue swelling. No radiopaque foreign bodies. IMPRESSION: No acute osseous abnormality of the left shoulder joint. Electronically Signed   By: Ree Molt M.D.   On: 12/10/2023 11:37   DG Pelvis 1-2 Views Result Date: 12/10/2023 CLINICAL DATA:  Fall. EXAM: PELVIS - 1-2 VIEW COMPARISON:  None Available. FINDINGS: Pelvis is intact with normal and symmetric sacroiliac joints. No acute fracture or dislocation. No aggressive osseous lesion. Visualized sacral arcuate lines are unremarkable. Unremarkable  symphysis pubis. There are mild degenerative changes of bilateral hip joints without significant joint space narrowing. Osteophytosis of the superior acetabulum. No radiopaque foreign bodies. IMPRESSION: Negative. Electronically Signed   By: Ree Molt M.D.   On: 12/10/2023 11:36   DG Hand Complete Left Result Date: 12/10/2023 CLINICAL DATA:  Fall.  Pain. EXAM: LEFT HAND - COMPLETE 3+ VIEW COMPARISON:  None Available. FINDINGS: There is diffuse osteopenia of the visualized osseous structures. No acute fracture or dislocation. No aggressive osseous lesion. Mild diffuse degenerative osteoarthritis of imaged joints. No radiopaque foreign bodies. Soft tissues are within normal limits. IMPRESSION: No acute osseous abnormality of the left hand. Electronically Signed   By: Ree Molt M.D.   On: 12/10/2023 11:36   VAS US  LOWER EXTREMITY VENOUS (DVT) (7a-7p) Result Date: 12/06/2023  Lower Venous DVT Study Patient Name:  [name]  Date of Exam:   12/05/2023 Medical Rec #: [id number]    Accession #:    [id number] Date of Birth: April 02, 1946   Patient Gender: F Patient Age:   15 years Exam Location:  [facility] Procedure:      VAS US  LOWER EXTREMITY VENOUS (DVT) Referring Phys: FONDA GEIPLE --------------------------------------------------------------------------------  Indications: Pain.  Risk Factors: DVT Hx of LE DVT. Performing Technologist: Elmarie Lindau, RVT  Examination Guidelines: A complete evaluation includes B-mode imaging, spectral Doppler, color Doppler, and power Doppler as needed of all accessible portions of each vessel. Bilateral testing is considered an integral part of a complete examination. Limited examinations for reoccurring indications may be performed as noted. The reflux portion of the exam is performed with the patient in reverse Trendelenburg.  +---------+---------------+---------+-----------+----------+--------------+ RIGHT     CompressibilityPhasicitySpontaneityPropertiesThrombus Aging +---------+---------------+---------+-----------+----------+--------------+ CFV      Full           Yes      Yes                                 +---------+---------------+---------+-----------+----------+--------------+ SFJ      Full                                                        +---------+---------------+---------+-----------+----------+--------------+ FV Prox  Partial                                      Chronic        +---------+---------------+---------+-----------+----------+--------------+ FV Mid   Partial                                      Chronic        +---------+---------------+---------+-----------+----------+--------------+  FV DistalNone                                         Chronic        +---------+---------------+---------+-----------+----------+--------------+ PFV      Full                                                        +---------+---------------+---------+-----------+----------+--------------+ POP      Partial                                      Chronic        +---------+---------------+---------+-----------+----------+--------------+ PTV      Full                                                        +---------+---------------+---------+-----------+----------+--------------+     Summary: RIGHT: - Findings consistent with chronic deep vein thrombosis involving the right femoral vein, and right popliteal vein.  - No cystic structure found in the popliteal fossa.   *See table(s) above for measurements and observations. Electronically signed by Norman Serve on 12/06/2023 at 10:07:40 AM.    Final    CT Lumbar Spine Wo Contrast Result Date: 12/05/2023 CLINICAL DATA:  Low back pain, increased fracture risk Myelopathy, acute, lumbar spine EXAM: CT LUMBAR SPINE WITHOUT CONTRAST TECHNIQUE: Multidetector CT imaging of the lumbar spine was performed without  intravenous contrast administration. Multiplanar CT image reconstructions were also generated. RADIATION DOSE REDUCTION: This exam was performed according to the departmental dose-optimization program which includes automated exposure control, adjustment of the mA and/or kV according to patient size and/or use of iterative reconstruction technique. COMPARISON:  None Available. FINDINGS: Segmentation: Normal. 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 Vertebrae: No fractures or osseous lesions. Paraspinal and other soft tissues: There is a fusiform infrarenal abdominal aorta aneurysm present, which measures up to of 4.3 x 4.2 cm in cross-sectional diameter. 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 The paraspinous soft tissues are otherwise unremarkable. Disc levels: L1-2: Normal. L2-3: Normal. L3-4: Broad-based disc bulging and bilateral facet hypertrophy, resulting in mild-to-moderate central spinal canal stenosis and bilateral lateral recess stenosis. L4-5: Broad-based disc bulging and moderate bilateral facet hypertrophy, causing moderate central spinal canal stenosis and moderate bilateral lateral recess stenosis. L5-S1: Disc space narrowing. Moderate bilateral facet arthrosis. No significant spinal canal or neural foraminal stenosis. IMPRESSION: 1. 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 2. Chronic degenerative disc disease at L3-4 with mild-to-moderate central spinal canal and bilateral lateral recess stenosis. 3. 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 Electronically Signed   By: Evalene Coho M.D.   On: 12/05/2023 12:33    Labs:  CBC: Recent Labs    12/10/23 1200 12/10/23 1207 12/11/23 0329 12/11/23 0618 12/12/23 0859  WBC 7.5  --  6.4  7.0 5.5  HGB 5.7* 5.8* 7.6* 8.0* 7.5*  HCT 17.5* 17.0* 23.0* 23.7* 22.7*  PLT 78*  --  74* 80* 100*    COAGS: Recent Labs    12/10/23 1200  INR 1.4*    BMP: Recent Labs    05/30/23 1423 05/30/23 1423 12/05/23 1306 12/10/23 1151 12/10/23 1200 12/10/23 1207 12/11/23 0329  NA 142   < > 140 136 136 139 140  K 3.2*  --  3.0* 3.9 3.1* 3.2* 3.6  CL 103  --  103 106 104 104 108  CO2 23  --  28  --  26  --  23  GLUCOSE 90   < > 81 92 87 90 89  BUN 17   < > 22 49* 39* 39* 28*  CALCIUM  9.2  --  9.2  --  8.7*  --  8.3*  CREATININE 0.97  --  1.07* 1.00 0.96 1.00 0.81  GFRNONAA  --   --  53*  --  >60  --  >60   < > = values in this interval not displayed.    LIVER FUNCTION TESTS: Recent Labs    12/05/23 1306 12/10/23 1200 12/11/23 0329  BILITOT 0.8 0.8 1.0  AST 19 36 29  ALT 9 17 16   ALKPHOS 69 40 43  PROT 8.2* 5.1* 5.6*  ALBUMIN 3.6 3.4* 3.0*    TUMOR MARKERS: No results for input(s): AFPTM, CEA, CA199, CHROMGRNA in the last 8760 hours.  Assessment and Plan:  [name] is a 78 y.o. female with pmhx prior CVA, COPD, prior DVT, hypertension.  Patient initially presented to hospital and admitted on a 325 with complaint of multiple falls.  Lab work initially showed acute anemia.  Initial hemoglobin 5.4, status post PRBC transfusions, up to 8.0 now.  Fecal occult blood positive.  GI evaluated with no plan for further endoscopic evaluation.  Given acute anemia and thrombocytopenia, hematology evaluated patient and requesting IR consult for image guided bone marrow biopsy and aspiration.  Risks and benefits of bone marrow biopsy was discussed with the patient and/or patient's family including, but not limited to bleeding, infection, damage to adjacent structures or low yield requiring additional tests.  All of the questions were answered and there is agreement to proceed.  Consent signed and in chart.   Thank you for allowing our service to participate in [name] 's care.  Electronically Signed: Kimble VEAR Clas, PA-C   12/12/2023, 9:53 AM      I spent a total of 20 Minutes    in face to face in clinical consultation, greater than 50% of which was counseling/coordinating care for bone marrow biopsy and aspiration

## 2023-12-12 NOTE — Progress Notes (Signed)
 PROGRESS NOTE                                                                                                                                                                                                             Patient Demographics:    [name], is a 78 y.o. female, DOB - 1945-06-02, FMW:[id number]  Outpatient Primary MD for the patient is Pcp, No    LOS - 1  Admit date - 12/10/2023    Chief Complaint  Patient presents with   Fall   Weakness       Brief Narrative (HPI from H&P)   78 y.o. female with medical history significant of hypertension, hyperlipidemia, CVA, TIA, COPD, DVT, intraventricular hemorrhage presenting after multiple falls at home.  This happened on the day of admission about 4-5 times, in the ER she was found to have severe anemia, head CT was nonacute, CT abdomen pelvis and chest nonacute, she was seen by GI who recommended hematological workup for anemia.   Subjective:   Patient in bed, appears comfortable, denies any headache, no fever, no chest pain or pressure, no shortness of breath , no abdominal pain. No focal weakness.   Assessment  & Plan :   Severe symptomatic anemia.  Weakly heme positive brown stool, anemia panel unimpressive except borderline B12 - she has been transfused 2 units of packed RBC, posttransfusion H&H stable, on PPI seen by GI, per GI not likely to be GI source for anemia, requested hematological workup, hematology has been consulted, bone marrow biopsy requested by hematology likely to be done on 12/12/2023.  Continue workup and monitor, PT OT here, looks like qualifies for SNF.  Multiple falls at home likely due to severe anemia.  PT OT and supportive care for above, CT head nonacute.  Likely SNF.   Nonspecific changes on CT of infrarenal aortic aneurysm and subpleural left lung nodule.  PCP to monitor needs close monitoring for both.  Dyslipidemia.  On  statin.  History of stroke including intracerebral bleed.  On statin continue.  Hypertension.  Resume Norvasc , added scheduled Norvasc  for better control, continue as needed hydralazine  and monitor  Low normal B12.  Replace.      Condition - Extremely Guarded  Family Communication  : Son Auston [phone]  on 12/11/2023 at 10:43 AM x 2.  Going to  voicemail which is not set up  Code Status : Full code  Consults  : GI, hematology  PUD Prophylaxis : PPI   Procedures  :     Bone marrow biopsy scheduled for 12/12/2023.    CT head and C-spine.  Nonacute.  CT chest-abdomen pelvis  1. No acute findings in the chest, abdomen, and pelvis related to the polytrauma and multiple falls. 2. 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 Follow up in 12 months and referral to vascular surgery. 3. 9x6 mm subpleural nodule within the left base appears smoothly marginated and may represent a benign intrapulmonary lymph node. Initial follow-up examination in 3 to 6 months is recommended. 4. Asymmetric soft tissue edema and subcutaneous fat stranding overlying the lateral and anterior left hip and proximal left lower extremity, with overlying skin thickening. Mild subcutaneous soft tissue stranding and skin thickening overlies the lateral aspect of the right hip. These findings may reflect superficial soft tissue contusion, or anasarca.      Disposition Plan  :    Status is: Observation  DVT Prophylaxis  :    SCDs Start: 12/10/23 1453    Lab Results  Component Value Date   PLT 80 (L) 12/11/2023    Diet :  Diet Order             Diet NPO time specified Except for: Sips with Meds  Diet effective midnight                    Inpatient Medications  Scheduled Meds:  amLODipine   10 mg Oral Daily   cyanocobalamin   1,000 mcg Subcutaneous Daily   [START ON 12/14/2023] vitamin B-12  1,000 mcg Oral Daily    pantoprazole   40 mg Oral Daily   rosuvastatin   20 mg Oral Daily   sodium chloride  flush  3 mL Intravenous Q12H   Continuous Infusions: PRN Meds:.acetaminophen  **OR** acetaminophen , hydrALAZINE   Antibiotics  :    Anti-infectives (From admission, onward)    None         Objective:   Vitals:   12/11/23 1726 12/11/23 1950 12/11/23 2313 12/12/23 0320  BP: (!) 157/80 130/86 (!) 150/93 (!) 147/98  Pulse:   64 (!) 54  Resp: 16 20 18  (!) 23  Temp: 98.2 F (36.8 C) 97.6 F (36.4 C) 97.8 F (36.6 C) 98 F (36.7 C)  TempSrc:  Oral Oral Oral  SpO2:  97% 92% 97%  Weight:      Height:        Wt Readings from Last 3 Encounters:  12/10/23 87.1 kg  12/05/23 95.3 kg  05/30/23 95.4 kg     Intake/Output Summary (Last 24 hours) at 12/12/2023 0755 Last data filed at 12/12/2023 0541 Gross per 24 hour  Intake --  Output 450 ml  Net -450 ml     Physical Exam  Awake Alert, No new F.N deficits, chronic left-sided weakness and facial droop [place].AT,PERRAL Supple Neck, No JVD,   Symmetrical Chest wall movement, Good air movement bilaterally, CTAB RRR,No Gallops,Rubs or new Murmurs,  +ve B.Sounds, Abd Soft, No tenderness,   No Cyanosis, Clubbing or edema        Data Review:    Recent Labs  Lab 12/05/23 1104 12/10/23 1151 12/10/23 1200 12/10/23 1207 12/11/23 0329 12/11/23 0618  WBC 3.2*  --  7.5  --  6.4 7.0  HGB 11.6* 5.4* 5.7* 5.8*  7.6* 8.0*  HCT 36.9 16.0* 17.5* 17.0* 23.0* 23.7*  PLT 45*  --  78*  --  74* 80*  MCV 92.0  --  93.6  --  92.4 91.5  MCH 28.9  --  30.5  --  30.5 30.9  MCHC 31.4  --  32.6  --  33.0 33.8  RDW 13.4  --  14.0  --  13.7 14.2  LYMPHSABS  --   --  1.6  --   --  1.3  MONOABS  --   --  0.4  --   --  0.4  EOSABS  --   --  0.2  --   --  0.2  BASOSABS  --   --  0.0  --   --  0.0    Recent Labs  Lab 12/05/23 1306 12/10/23 1151 12/10/23 1200 12/10/23 1207 12/11/23 0329  NA 140 136 136 139 140  K 3.0* 3.9 3.1* 3.2* 3.6  CL 103 106 104 104 108   CO2 28  --  26  --  23  ANIONGAP 9  --  6  --  9  GLUCOSE 81 92 87 90 89  BUN 22 49* 39* 39* 28*  CREATININE 1.07* 1.00 0.96 1.00 0.81  AST 19  --  36  --  29  ALT 9  --  17  --  16  ALKPHOS 69  --  40  --  43  BILITOT 0.8  --  0.8  --  1.0  ALBUMIN 3.6  --  3.4*  --  3.0*  INR  --   --  1.4*  --   --   MG  --   --  2.0  --   --   CALCIUM  9.2  --  8.7*  --  8.3*      Recent Labs  Lab 12/05/23 1306 12/10/23 1200 12/11/23 0329  INR  --  1.4*  --   MG  --  2.0  --   CALCIUM  9.2 8.7* 8.3*    --------------------------------------------------------------------------------------------------------------- Lab Results  Component Value Date   CHOL 288 (H) 05/30/2023   HDL 78 05/30/2023   LDLCALC 165 (H) 05/30/2023   TRIG 248 (H) 05/30/2023   CHOLHDL 3.7 05/30/2023    Lab Results  Component Value Date   HGBA1C 5.3 09/11/2020   No results for input(s): TSH, T4TOTAL, FREET4, T3FREE, THYROIDAB in the last 72 hours. Recent Labs    12/11/23 0619  VITAMINB12 251  FOLATE 4.8*  FERRITIN 80  TIBC 277  IRON 70  RETICCTPCT 3.1   ------------------------------------------------------------------------------------------------------------------ Cardiac Enzymes No results for input(s): CKMB, TROPONINI, MYOGLOBIN in the last 168 hours.  Invalid input(s): CK  Micro Results No results found for this or any previous visit (from the past 240 hours).  Radiology Report CT CHEST ABDOMEN PELVIS W CONTRAST Result Date: 12/10/2023 EXAM: CT CHEST, ABDOMEN AND PELVIS WITH CONTRAST 12/10/2023 01:43:26 PM TECHNIQUE: CT of the chest, abdomen and pelvis was performed with the administration of intravenous contrast. Multiplanar reformatted images are provided for review. Automated exposure control, iterative reconstruction, and/or weight based adjustment of the mA/kV was utilized to reduce the radiation dose to as low as reasonably achievable. COMPARISON: 11/16/2010 CLINICAL  HISTORY: Polytrauma, blunt; Multiple falls, pain. Triage notes; Pt arrived by EMS from home. Reports that she has had multi falls over past 1 1/2 weeks. Denies hitting head. No anticoagulant use. Pt has positive recent history of UTI dx, but keflex  bottle found  in home was still full. Hydro/acet was empty. Pt c/o ; increased weakness and loss of independence. Lives at home with daughter and other family members. Pt w/hx CVA + L sided weakness. Presents with multiple bruising over entire body predominantly on L hip. FINDINGS: CHEST: MEDIASTINUM: Heart and pericardium are unremarkable. The central airways are clear. Small hiatal hernia. THORACIC LYMPH NODES: No mediastinal, hilar or axillary lymphadenopathy. LUNGS AND PLEURA: No focal consolidation or pulmonary edema. No pleural effusion or pneumothorax. Pleural calcifications are identified overlying the left lung, unchanged from the previous exam. Chronic band-like area of scarring is noted within the posterior and medial left lower lobe. Subpleural nodule in the posterior left lung base measures 0.9 x 0.6 cm, new from previous exam. ABDOMEN AND PELVIS: LIVER: The liver is unremarkable. GALLBLADDER AND BILE DUCTS: Gallbladder is unremarkable. No biliary ductal dilatation. SPLEEN: No acute abnormality. PANCREAS: No acute abnormality. ADRENAL GLANDS: No acute abnormality. KIDNEYS, URETERS AND BLADDER: No stones in the kidneys or ureters. No hydronephrosis. No perinephric or periureteral stranding. Urinary bladder is unremarkable. GI AND BOWEL: Stomach demonstrates no acute abnormality. There is no bowel obstruction. The appendix is visualized and appears normal. REPRODUCTIVE ORGANS: No acute abnormality. PERITONEUM AND RETROPERITONEUM: No ascites. No free air. VASCULATURE: Aortic atherosclerotic calcification. Focal penetrating atherosclerotic ulceration noted along the undersurface of the aortic arch measuring 1.7 x 0.8 cm, sagittal image 89/7. Coronary artery  calcifications. The infrarenal abdominal aorta measures 4.4 x 4.4 cm. 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 ABDOMINAL AND PELVIS LYMPH NODES: No lymphadenopathy. BONES AND SOFT TISSUES: Thoracic degenerative disc disease. There is asymmetric soft tissue edema and subcutaneous fat stranding within the soft tissues overlying the lateral aspect and anterior aspect of the left hip and image portions of the proximal left lower extremity. Overlying skin thickening noted. Mild subcutaneous soft tissue stranding and skin thickening overlies the lateral aspect right hip. IMPRESSION: 1. No acute findings in the chest, abdomen, and pelvis related to the polytrauma and multiple falls. 2. 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 Follow up in 12 months and referral to vascular surgery. 3. 9x6 mm subpleural nodule within the left base appears smoothly marginated and may represent a benign intrapulmonary lymph node. Initial follow-up examination in 3 to 6 months is recommended. 4. Asymmetric soft tissue edema and subcutaneous fat stranding overlying the lateral and anterior left hip and proximal left lower extremity, with overlying skin thickening. Mild subcutaneous soft tissue stranding and skin thickening overlies the lateral aspect of the right hip. These findings may reflect superficial soft tissue contusion, or anasarca. Clinical correlation advised. Electronically signed by: Waddell Calk MD 12/10/2023 02:04 PM EDT RP Workstation: [id number]   CT Cervical Spine Wo Contrast Result Date: 12/10/2023 CLINICAL DATA:  78 year old female with multiple falls recently. Increased weakness. EXAM: CT CERVICAL SPINE WITHOUT CONTRAST TECHNIQUE: Multidetector CT imaging of the cervical spine was performed without intravenous contrast. Multiplanar CT image reconstructions were also generated. RADIATION  DOSE REDUCTION: This exam was performed according to the departmental dose-optimization program which includes automated exposure control, adjustment of the mA and/or kV according to patient size and/or use of iterative reconstruction technique. COMPARISON:  Head CT today.  CTA head and neck 09/11/2020. FINDINGS: Alignment: Stable straightening of lordosis. Cervicothoracic junction alignment is within normal limits. Bilateral posterior element alignment is within normal limits. Skull base and vertebrae: Bone mineralization is  within normal limits for age. Visualized skull base is intact. No atlanto-occipital dissociation. C1 and C2 appear intact and aligned. No acute osseous abnormality identified. Soft tissues and spinal canal: No prevertebral fluid or swelling. No visible canal hematoma. Advanced chronic cervical carotid atherosclerosis. New right side carotid vascular stent since 2022. Disc levels: Diffuse idiopathic skeletal hyperostosis (DISH). Associated interbody ankylosis C3 through C7, and likely developing also at the cervicothoracic junction. Associated bulky disc and endplate degeneration in the cervical spine, but no CT evidence of spinal stenosis. Upper chest: Calcified aortic atherosclerosis. Otherwise negative visible noncontrast thoracic inlet. IMPRESSION: 1. No acute traumatic injury identified in the cervical spine. 2. DISH with widespread cervical intervertebral ankylosis. 3. Cervical right carotid artery stenting since 2022 CTA. Aortic Atherosclerosis (ICD10-I70.0). Electronically Signed   By: VEAR Hurst M.D.   On: 12/10/2023 12:35   CT Head Wo Contrast Result Date: 12/10/2023 CLINICAL DATA:  78 year old female with multiple falls recently. Increased weakness. EXAM: CT HEAD WITHOUT CONTRAST TECHNIQUE: Contiguous axial images were obtained from the base of the skull through the vertex without intravenous contrast. RADIATION DOSE REDUCTION: This exam was performed according to the departmental  dose-optimization program which includes automated exposure control, adjustment of the mA and/or kV according to patient size and/or use of iterative reconstruction technique. COMPARISON:  Brain MRI 09/10/2020.  Head CT 09/16/2020. FINDINGS: Brain: Progressive encephalomalacia posterior right MCA territory where MRI was abnormal in 2022. Ex vacuo ventricular enlargement now. Punctate dystrophic calcification associated there. Resolved small volume intracranial hemorrhage seen on prior CT. New since that time but circumscribed and chronic appearing lacunar infarct medial right lentiform (series 6, image 14). Contralateral left basal ganglia vascular calcification is stable. No midline shift, ventriculomegaly, mass effect, evidence of mass lesion, intracranial hemorrhage or evidence of cortically based acute infarction. Vascular: Calcified atherosclerosis at the skull base. No suspicious intracranial vascular hyperdensity. Skull: No acute osseous abnormality identified. Sinuses/Orbits: Substantially progressed bilateral paranasal sinus periosteal thickening compared to the prior CT. Generalized paranasal sinus mucosal thickening is similar. Tympanic cavities and mastoids well aerated. Small fluid levels in the paranasal sinuses. Other: Disconjugate gaze. No acute orbit or scalp soft tissue finding. IMPRESSION: 1. No acute intracranial abnormality or acute traumatic injury identified. 2. Progressed chronic ischemic disease since 2022 post ischemic encephalomalacia in the right MCA territory. 3. Acute and chronic appearing bilateral paranasal sinus inflammation has progressed since 2022. Electronically Signed   By: VEAR Hurst M.D.   On: 12/10/2023 12:31   DG FEMUR MIN 2 VIEWS LEFT Result Date: 12/10/2023 CLINICAL DATA:  [id number] Fall [id number] Pain 144615. EXAM: LEFT FEMUR 2 VIEWS COMPARISON:  None Available. FINDINGS: No acute fracture or dislocation. No aggressive osseous lesion. There are mild degenerative changes  of the hip joint without significant joint space narrowing. Osteophytosis of the superior acetabulum. Mild degenerative changes of the knee joint. No focal soft tissue swelling. No radiopaque foreign bodies. IMPRESSION: *No acute osseous abnormality of the left femur. Electronically Signed   By: Ree Molt M.D.   On: 12/10/2023 11:47   DG Forearm Left Result Date: 12/10/2023 CLINICAL DATA:  [id number] Fall [id number] Pain [id number] EXAM: LEFT FOREARM - 2 VIEW COMPARISON:  None Available. FINDINGS: There is diffuse osteopenia of the visualized osseous structures. No acute fracture or dislocation. No aggressive osseous lesion. Note is made of negative ulnar variance. Mild diffuse degenerative changes of imaged joints. No radiopaque foreign bodies. Soft tissues are within normal limits. IMPRESSION: No acute osseous abnormality of the  left forearm. Electronically Signed   By: Ree Molt M.D.   On: 12/10/2023 11:39   DG Shoulder Left Result Date: 12/10/2023 CLINICAL DATA:  fall. EXAM: LEFT SHOULDER - 2+ VIEW COMPARISON:  None Available. FINDINGS: No acute fracture or dislocation. No aggressive osseous lesion. Glenohumeral and acromioclavicular joints are normal in alignment. Moderate osteoarthritis of the acromioclavicular joint. Mild osteoarthritis of the glenohumeral joint. No soft tissue swelling. No radiopaque foreign bodies. IMPRESSION: No acute osseous abnormality of the left shoulder joint. Electronically Signed   By: Ree Molt M.D.   On: 12/10/2023 11:37   DG Pelvis 1-2 Views Result Date: 12/10/2023 CLINICAL DATA:  Fall. EXAM: PELVIS - 1-2 VIEW COMPARISON:  None Available. FINDINGS: Pelvis is intact with normal and symmetric sacroiliac joints. No acute fracture or dislocation. No aggressive osseous lesion. Visualized sacral arcuate lines are unremarkable. Unremarkable symphysis pubis. There are mild degenerative changes of bilateral hip joints without significant joint space narrowing.  Osteophytosis of the superior acetabulum. No radiopaque foreign bodies. IMPRESSION: Negative. Electronically Signed   By: Ree Molt M.D.   On: 12/10/2023 11:36   DG Hand Complete Left Result Date: 12/10/2023 CLINICAL DATA:  Fall.  Pain. EXAM: LEFT HAND - COMPLETE 3+ VIEW COMPARISON:  None Available. FINDINGS: There is diffuse osteopenia of the visualized osseous structures. No acute fracture or dislocation. No aggressive osseous lesion. Mild diffuse degenerative osteoarthritis of imaged joints. No radiopaque foreign bodies. Soft tissues are within normal limits. IMPRESSION: No acute osseous abnormality of the left hand. Electronically Signed   By: Ree Molt M.D.   On: 12/10/2023 11:36     Signature  -   Lavada Stank M.D on 12/12/2023 at 7:55 AM   -  To page go to [email or web address]

## 2023-12-13 ENCOUNTER — Inpatient Hospital Stay (HOSPITAL_COMMUNITY)

## 2023-12-13 DIAGNOSIS — D649 Anemia, unspecified: Secondary | ICD-10-CM | POA: Diagnosis not present

## 2023-12-13 HISTORY — PX: IR BONE MARROW BIOPSY & ASPIRATION: IMG5727

## 2023-12-13 LAB — CBC WITH DIFFERENTIAL/PLATELET
Abs Immature Granulocytes: 0.04 K/uL (ref 0.00–0.07)
Basophils Absolute: 0 K/uL (ref 0.0–0.1)
Basophils Relative: 0 %
Eosinophils Absolute: 0.2 K/uL (ref 0.0–0.5)
Eosinophils Relative: 4 %
HCT: 23 % — ABNORMAL LOW (ref 36.0–46.0)
Hemoglobin: 7.6 g/dL — ABNORMAL LOW (ref 12.0–15.0)
Immature Granulocytes: 1 %
Lymphocytes Relative: 22 %
Lymphs Abs: 1.4 K/uL (ref 0.7–4.0)
MCH: 30.8 pg (ref 26.0–34.0)
MCHC: 33 g/dL (ref 30.0–36.0)
MCV: 93.1 fL (ref 80.0–100.0)
Monocytes Absolute: 0.5 K/uL (ref 0.1–1.0)
Monocytes Relative: 8 %
Neutro Abs: 4.3 K/uL (ref 1.7–7.7)
Neutrophils Relative %: 65 %
Platelets: 111 K/uL — ABNORMAL LOW (ref 150–400)
RBC: 2.47 MIL/uL — ABNORMAL LOW (ref 3.87–5.11)
RDW: 15.5 % (ref 11.5–15.5)
WBC: 6.4 K/uL (ref 4.0–10.5)
nRBC: 0 % (ref 0.0–0.2)

## 2023-12-13 LAB — BASIC METABOLIC PANEL WITH GFR
Anion gap: 7 (ref 5–15)
BUN: 16 mg/dL (ref 8–23)
CO2: 24 mmol/L (ref 22–32)
Calcium: 8.6 mg/dL — ABNORMAL LOW (ref 8.9–10.3)
Chloride: 106 mmol/L (ref 98–111)
Creatinine, Ser: 0.74 mg/dL (ref 0.44–1.00)
GFR, Estimated: >60 mL/min (ref >60)
Glucose, Bld: 83 mg/dL (ref 70–99)
Potassium: 3.5 mmol/L (ref 3.5–5.1)
Sodium: 137 mmol/L (ref 135–145)

## 2023-12-13 LAB — MAGNESIUM: Magnesium: 1.8 mg/dL (ref 1.7–2.4)

## 2023-12-13 LAB — HAPTOGLOBIN: Haptoglobin: 88 mg/dL (ref 42–346)

## 2023-12-13 MED ORDER — FENTANYL CITRATE (PF) 100 MCG/2ML IJ SOLN
INTRAMUSCULAR | Status: AC
  Filled 2023-12-13: qty 2

## 2023-12-13 MED ORDER — MIDAZOLAM HCL 2 MG/2ML IJ SOLN
INTRAMUSCULAR | Status: AC
  Filled 2023-12-13: qty 2

## 2023-12-13 MED ORDER — LIDOCAINE HCL 1 % IJ SOLN
INTRAMUSCULAR | Status: AC
Start: 2023-12-13 — End: 2023-12-13
  Filled 2023-12-13: qty 20

## 2023-12-13 MED ORDER — MIDAZOLAM HCL 2 MG/2ML IJ SOLN
INTRAMUSCULAR | Status: AC | PRN
Start: 2023-12-13 — End: 2023-12-13
  Administered 2023-12-13 (×2): .5 mg via INTRAVENOUS

## 2023-12-13 MED ORDER — FENTANYL CITRATE (PF) 100 MCG/2ML IJ SOLN
INTRAMUSCULAR | Status: AC | PRN
  Administered 2023-12-13: 50 ug via INTRAVENOUS

## 2023-12-13 NOTE — Plan of Care (Signed)

## 2023-12-13 NOTE — Evaluation (Signed)
 Occupational Therapy Evaluation Patient Details Name: [name] MRN: [id number] DOB: 26-Jun-1945 Today's Date: 12/13/2023   History of Present Illness   Pt is a 78 y/o female presenting 8/3 after suffering 4-5 falls a day for the past couple of days.  Work up included anemia with Hgb in the 5's. Bone biopsy 12/13/23  PMHx:  HTN, HLD, CVA, COPD, DVT, IVH.     Clinical Impressions Pt reports having assistance for bed mobility and standing using her walker. Her daughter assists with sponge bathing, dressing and toilet transfers. Pt is typically able to self feed, groom and manage pericare. Pt presents with generalized weakness, decreased sitting balance when challenged and residual L side weakness with L field deficit from prior CVA. She is intermittently labile. Pt requires min to total assist for ADLs and +2 max assist for sit to stand from elevated bed. Patient will benefit from continued inpatient follow up therapy, <3 hours/day. Pt is in agreement with this plan as she only has intermittent assistance  at home.      If plan is discharge home, recommend the following:   Two people to help with walking and/or transfers;A lot of help with bathing/dressing/bathroom;Assistance with cooking/housework;Assistance with feeding;Direct supervision/assist for medications management;Direct supervision/assist for financial management;Assist for transportation;Help with stairs or ramp for entrance     Functional Status Assessment   Patient has had a recent decline in their functional status and demonstrates the ability to make significant improvements in function in a reasonable and predictable amount of time.     Equipment Recommendations   None recommended by OT     Recommendations for Other Services         Precautions/Restrictions   Precautions Precautions: Fall Restrictions Weight Bearing Restrictions Per Provider Order: No     Mobility Bed Mobility Overal bed mobility: Needs  Assistance Bed Mobility: Supine to Sit, Sit to Supine     Supine to sit: Mod assist Sit to supine: Mod assist   General bed mobility comments: increased time, assist to raise trunk and for hips to EOB, assist for LEs back into bed    Transfers Overall transfer level: Needs assistance Equipment used: Rolling walker (2 wheels) Transfers: Sit to/from Stand Sit to Stand: +2 physical assistance, Max assist           General transfer comment: from elevated bed, assist to rise and steady with increased time and effort      Balance Overall balance assessment: Needs assistance   Sitting balance-Leahy Scale: Fair Sitting balance - Comments: posterior and left bias when balance challenged   Standing balance support: Bilateral upper extremity supported Standing balance-Leahy Scale: Poor                             ADL either performed or assessed with clinical judgement   ADL Overall ADL's : Needs assistance/impaired Eating/Feeding: Minimal assistance;Bed level   Grooming: Wash/dry hands;Wash/dry face;Contact guard assist;Sitting   Upper Body Bathing: Moderate assistance;Sitting   Lower Body Bathing: Total assistance;Bed level   Upper Body Dressing : Minimal assistance;Sitting   Lower Body Dressing: Total assistance;Bed level       Toileting- Clothing Manipulation and Hygiene: Total assistance;Bed level               Vision Ability to See in Adequate Light: 1 Impaired Patient Visual Report: Peripheral vision impairment (L vision impairment)       Perception  Praxis         Pertinent Vitals/Pain Pain Assessment Pain Assessment: Faces Faces Pain Scale: No hurt     Extremity/Trunk Assessment Upper Extremity Assessment Upper Extremity Assessment: Right hand dominant;LUE deficits/detail LUE Deficits / Details: weakness post CVA LUE Coordination: decreased fine motor;decreased gross motor   Lower Extremity Assessment Lower Extremity  Assessment: Defer to PT evaluation   Cervical / Trunk Assessment Cervical / Trunk Assessment: Kyphotic   Communication Communication Communication: Impaired Factors Affecting Communication: Hearing impaired   Cognition Arousal: Alert Behavior During Therapy: Lability, Flat affect Cognition: No family/caregiver present to determine baseline             OT - Cognition Comments: questionable historian, realistic that she cannot be managed at home in her current condition                 Following commands: Intact       Cueing  General Comments   Cueing Techniques: Verbal cues      Exercises     Shoulder Instructions      Home Living Family/patient expects to be discharged to:: Private residence Living Arrangements: Other relatives;Children (grandson) Available Help at Discharge: Available PRN/intermittently Type of Home: Apartment Home Access: Level entry     Home Layout: Two level;Able to live on main level with bedroom/bathroom Alternate Level Stairs-Number of Steps: flight   Bathroom Shower/Tub: Producer, television/film/video: Standard     Home Equipment: Agricultural consultant (2 wheels)          Prior Functioning/Environment Prior Level of Function : Needs assist             Mobility Comments: assist in and out of bed, assist to stand, stays in bed majority of time ADLs Comments: self feeds, grooms, assisted for sponge bathing, dressing, toilet transfers and all IADLs, reports she handles her own meds and can do pericare    OT Problem List: Decreased strength;Impaired balance (sitting and/or standing);Decreased coordination;Decreased cognition;Decreased knowledge of use of DME or AE;Impaired UE functional use   OT Treatment/Interventions: Self-care/ADL training;DME and/or AE instruction;Therapeutic activities;Patient/family education;Balance training      OT Goals(Current goals can be found in the care plan section)   Acute Rehab OT  Goals OT Goal Formulation: With patient Time For Goal Achievement: 12/27/23 Potential to Achieve Goals: Good ADL Goals Pt Will Perform Grooming: with set-up;sitting Pt Will Perform Upper Body Dressing: sitting;with supervision Pt Will Transfer to Toilet: with mod assist;stand pivot transfer;bedside commode Pt Will Perform Toileting - Clothing Manipulation and hygiene: with mod assist;sit to/from stand Additional ADL Goal #1: Pt will demonstrate ability to maintain sitting balance while engaged in ADLs at EOB.   OT Frequency:  Min 2X/week    Co-evaluation              AM-PAC OT 6 Clicks Daily Activity     Outcome Measure Help from another person eating meals?: A Little Help from another person taking care of personal grooming?: A Little Help from another person toileting, which includes using toliet, bedpan, or urinal?: Total Help from another person bathing (including washing, rinsing, drying)?: A Lot Help from another person to put on and taking off regular upper body clothing?: A Little Help from another person to put on and taking off regular lower body clothing?: Total 6 Click Score: 13   End of Session Equipment Utilized During Treatment: Gait belt;Rolling walker (2 wheels)  Activity Tolerance: Patient tolerated treatment well Patient left: in  bed;with call bell/phone within reach;with bed alarm set  OT Visit Diagnosis: Unsteadiness on feet (R26.81);Other abnormalities of gait and mobility (R26.89);Muscle weakness (generalized) (M62.81);Cognitive communication deficit (R41.841);Hemiplegia and hemiparesis Hemiplegia - Right/Left: Left Hemiplegia - dominant/non-dominant: Non-Dominant                Time: 1436-1500 OT Time Calculation (min): 24 min Charges:  OT General Charges $OT Visit: 1 Visit OT Evaluation $OT Eval Moderate Complexity: 1 Mod OT Treatments $Self Care/Home Management : 8-22 mins  Mliss HERO, OTR/L Acute Rehabilitation Services Office: [phone]    Kennth Mliss Helling 12/13/2023, 3:38 PM

## 2023-12-13 NOTE — Progress Notes (Signed)
 PROGRESS NOTE    Brooklee Mack  FMW:[id number] DOB: [birth year] DOA: 12/10/2023 PCP: Pcp, No     Brief Narrative:  [name] is a 78 y.o. female with medical history significant of hypertension, hyperlipidemia, CVA, TIA, COPD, DVT, intraventricular hemorrhage presenting after multiple falls at home.  This happened on the day of admission about 4-5 times, in the ER she was found to have severe anemia, head CT was nonacute, CT abdomen pelvis and chest nonacute, she was seen by GI who recommended hematological workup for anemia.   New events last 24 hours / Subjective: Patient underwent bone marrow biopsy this morning.   Assessment & Plan:   Principal Problem:   Symptomatic anemia Active Problems:   Dyslipidemia   History of TIA (transient ischemic attack) and stroke   Essential hypertension   Low folate   Severe symptomatic anemia - S/p 2 unit pRBC 8/3 - Had weakly heme positive brown stool, GI consulted, no inpatient endoscopic eval planned  - Hematology consulted.  S/p bone marrow biopsy 8/6.  She will follow-up with Dr. Lanny as outpatient  Multiple falls at home - PT OT recommending SNF placement  Nonspecific changes on CT of infrarenal aortic aneurysm and subpleural left lung nodule  - Outpatient follow up   Dyslipidemia - Crestor    Hx stroke, ICH  HTN - Norvasc , hydralazine     DVT prophylaxis:  SCDs Start: 12/10/23 1453  Code Status: Full Family Communication: None at bedside Disposition Plan:  SNF  Status is: Inpatient Remains inpatient appropriate because: SNF placement pending     Antimicrobials:  Anti-infectives (From admission, onward)    None        Objective: Vitals:   12/13/23 0820 12/13/23 0825 12/13/23 0830 12/13/23 0901  BP: 134/62 120/73 (!) 127/56 (!) 107/55  Pulse: 82 66 72   Resp: (!) 23 17 20    Temp:    98.5 F (36.9 C)  TempSrc:    Oral  SpO2: 100% 100% 100%   Weight:      Height:        Intake/Output Summary (Last 24  hours) at 12/13/2023 1456 Last data filed at 12/13/2023 0902 Gross per 24 hour  Intake --  Output 1000 ml  Net -1000 ml   Filed Weights   12/10/23 0930  Weight: 87.1 kg    Examination:  General exam: Appears calm and comfortable  Respiratory system: Clear to auscultation. Respiratory effort normal. No respiratory distress. No conversational dyspnea.  Cardiovascular system: S1 & S2 heard, RRR. No murmurs. No pedal edema. Gastrointestinal system: Abdomen is nondistended, soft and nontender. Normal bowel sounds heard. Extremities: Symmetric in appearance  Skin: No rashes, lesions or ulcers on exposed skin   Data Reviewed: I have personally reviewed following labs and imaging studies  CBC: Recent Labs  Lab 12/10/23 1200 12/10/23 1207 12/11/23 0329 12/11/23 0618 12/12/23 0859 12/13/23 0602  WBC 7.5  --  6.4 7.0 5.5 6.4  NEUTROABS 5.1  --   --  4.9 3.6 4.3  HGB 5.7* 5.8* 7.6* 8.0* 7.5* 7.6*  HCT 17.5* 17.0* 23.0* 23.7* 22.7* 23.0*  MCV 93.6  --  92.4 91.5 93.4 93.1  PLT 78*  --  74* 80* 100* 111*   Basic Metabolic Panel: Recent Labs  Lab 12/10/23 1151 12/10/23 1200 12/10/23 1207 12/11/23 0329 12/13/23 0602  NA 136 136 139 140 137  K 3.9 3.1* 3.2* 3.6 3.5  CL 106 104 104 108 106  CO2  --  26  --  23 24  GLUCOSE 92 87 90 89 83  BUN 49* 39* 39* 28* 16  CREATININE 1.00 0.96 1.00 0.81 0.74  CALCIUM   --  8.7*  --  8.3* 8.6*  MG  --  2.0  --   --  1.8   GFR: Estimated Creatinine Clearance: 68.3 mL/min (by C-G formula based on SCr of 0.74 mg/dL). Liver Function Tests: Recent Labs  Lab 12/10/23 1200 12/11/23 0329  AST 36 29  ALT 17 16  ALKPHOS 40 43  BILITOT 0.8 1.0  PROT 5.1* 5.6*  ALBUMIN 3.4* 3.0*   No results for input(s): LIPASE, AMYLASE in the last 168 hours. No results for input(s): AMMONIA in the last 168 hours. Coagulation Profile: Recent Labs  Lab 12/10/23 1200  INR 1.4*   Cardiac Enzymes: Recent Labs  Lab 12/10/23 1200  CKTOTAL 762*    BNP (last 3 results) No results for input(s): PROBNP in the last 8760 hours. HbA1C: No results for input(s): HGBA1C in the last 72 hours. CBG: No results for input(s): GLUCAP in the last 168 hours. Lipid Profile: No results for input(s): CHOL, HDL, LDLCALC, TRIG, CHOLHDL, LDLDIRECT in the last 72 hours. Thyroid Function Tests: No results for input(s): TSH, T4TOTAL, FREET4, T3FREE, THYROIDAB in the last 72 hours. Anemia Panel: Recent Labs    12/11/23 0619  VITAMINB12 251  FOLATE 4.8*  FERRITIN 80  TIBC 277  IRON 70  RETICCTPCT 3.1   Sepsis Labs: No results for input(s): PROCALCITON, LATICACIDVEN in the last 168 hours.  No results found for this or any previous visit (from the past 240 hours).    Radiology Studies: IR BONE MARROW BIOPSY & ASPIRATION Result Date: 12/13/2023 CLINICAL DATA:  Anemia EXAM: FLUOROSCOPIC GUIDED DEEP ILIAC BONE ASPIRATION AND CORE BIOPSY TECHNIQUE: Patient was placed prone on the fluoro table and limited images through the pelvis were obtained. Appropriate skin entry site was identified. Skin site was marked, prepped with chlorhexidine , draped in usual sterile fashion, and infiltrated locally with 1% lidocaine . Intravenous Fentanyl  50mcg and Versed  1mg  were administered as conscious sedation during continuous monitoring of the patient's level of consciousness and physiological / cardiorespiratory status by the radiology RN, with a total moderate sedation time of 10 minutes. Under fluoroscopic guidance an 11-gauge trocar bone needle was advanced into the left iliac bone just lateral to the sacroiliac joint. Once needle tip position was confirmed, core and aspiration samples were obtained, submitted to pathology for approval. Patient tolerated procedure well. COMPLICATIONS: COMPLICATIONS none IMPRESSION: 1. Technically successful fluoro guided left iliac bone core and aspiration biopsy. Electronically Signed   By: JONETTA Faes M.D.    On: 12/13/2023 13:57      Scheduled Meds:  amLODipine   10 mg Oral Daily   [START ON 12/14/2023] vitamin B-12  1,000 mcg Oral Daily   hydrALAZINE   50 mg Oral Q8H   pantoprazole   40 mg Oral Daily   rosuvastatin   20 mg Oral Daily   sodium chloride  flush  3 mL Intravenous Q12H   Continuous Infusions:   LOS: 2 days   Time spent: 35 minutes   Delon Hoe, DO [facility] 12/13/2023, 2:56 PM   Available via Epic secure chat 7am-7pm After these hours, please refer to coverage provider listed on [email or web address]

## 2023-12-13 NOTE — NC FL2 (Signed)
 [place]  MEDICAID [id number] LEVEL OF CARE FORM     IDENTIFICATION  Patient Name: [name] Birthdate: Sep 08, 1945 Sex: female Admission Date (Current Location): 12/10/2023  [facility] and [place] Number:  Producer, television/film/video and Address:  [facility], [address], [place], [place] [address]      Provider Number: [id number]  Attending Physician Name and Address:  Rojelio Nest, DO  Relative Name and Phone Number:       Current Level of Care: Hospital Recommended Level of Care: Skilled Nursing Facility Prior Approval Number:    Date Approved/Denied:   PASRR Number: [id number]  Discharge Plan: SNF    Current Diagnoses: Patient Active Problem List   Diagnosis Date Noted   Anemia 12/11/2023   Low folate 12/11/2023   Acute GI bleeding 12/10/2023   Right shoulder pain    Acute blood loss anemia    Essential hypertension    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   Intraventricular hemorrhage (HCC) 09/16/2020   History of TIA (transient ischemic attack) and stroke 09/16/2020   History of COPD    Dyslipidemia     Orientation RESPIRATION BLADDER Height & Weight     Self, Time, Situation, Place  Normal Incontinent, External catheter Weight: 192 lb (87.1 kg) Height:  5' 10 (177.8 cm)  BEHAVIORAL SYMPTOMS/MOOD NEUROLOGICAL BOWEL NUTRITION STATUS      Incontinent Diet (See dc summary)  AMBULATORY STATUS COMMUNICATION OF NEEDS Skin   Extensive Assist Verbally Normal                       Personal Care Assistance Level of Assistance  Bathing, Feeding, Dressing Bathing Assistance: Maximum assistance Feeding assistance: Limited assistance Dressing Assistance: Limited assistance     Functional Limitations Info             SPECIAL CARE FACTORS FREQUENCY  PT (By licensed PT), OT (By licensed OT)     PT Frequency: 5x/week OT Frequency: 5x/week            Contractures Contractures Info: Not present    Additional Factors Info   Code Status, Allergies Code Status Info: Full Allergies Info: NKA           Current Medications (12/13/2023):  This is the current hospital active medication list Current Facility-Administered Medications  Medication Dose Route Frequency Provider Last Rate Last Admin   acetaminophen  (TYLENOL ) tablet 650 mg  650 mg Oral Q6H PRN Seena Marsa NOVAK, MD   650 mg at 12/12/23 1340   Or   acetaminophen  (TYLENOL ) suppository 650 mg  650 mg Rectal Q6H PRN Seena Marsa NOVAK, MD       amLODipine  (NORVASC ) tablet 10 mg  10 mg Oral Daily Singh, Prashant K, MD   10 mg at 12/13/23 0953   [START ON 12/14/2023] cyanocobalamin  (VITAMIN B12) tablet 1,000 mcg  1,000 mcg Oral Daily Singh, Prashant K, MD       hydrALAZINE  (APRESOLINE ) injection 10 mg  10 mg Intravenous Q6H PRN Singh, Prashant K, MD       hydrALAZINE  (APRESOLINE ) tablet 50 mg  50 mg Oral Q8H Singh, Prashant K, MD   50 mg at 12/13/23 9365   pantoprazole  (PROTONIX ) EC tablet 40 mg  40 mg Oral Daily Singh, Prashant K, MD   40 mg at 12/13/23 9046   rosuvastatin  (CRESTOR ) tablet 20 mg  20 mg Oral Daily Melvin, Alexander B, MD   20 mg at 12/13/23 9046   sodium chloride   flush (NS) 0.9 % injection 3 mL  3 mL Intravenous Q12H Seena Marsa NOVAK, MD   3 mL at 12/13/23 9046     Discharge Medications: Please see discharge summary for a list of discharge medications.  Relevant Imaging Results:  Relevant Lab Results:   Additional Information SSN : [id number]  [name] RAMAN Rakan Soffer, LCSW

## 2023-12-13 NOTE — TOC Progression Note (Addendum)
 Transition of Care [facility]) - Progression Note    Patient Details  Name: [name] MRN: [id number] Date of Birth: Nov 16, 1945  Transition of Care [facility]) CM/SW Contact  [name], LCSW Phone Number: 12/13/2023, 2:15 PM  Clinical Narrative:    12pm-CSW received consult for possible SNF placement at time of discharge. CSW spoke with patient. Patient expressed understanding of PT recommendation and is agreeable to SNF placement at time of discharge. Patient stated she has not heard from any family while she has been in the hospital but thought she heard that someone spoke with her son. CSW discussed insurance authorization process and will provide Medicare SNF ratings list. CSW will send out referrals for review and provide bed offers as available.   CSW left another voicemail for patient's son.  CSW reached out to OT to see if patient can be evaluated prior to submitting for insurance approval for SNF.   3:47 PM-CSW met with patient to provide SNF bed offers and ratings list. She stated her son's number has changed and she does not know it. He [place] of [place]. She is not familiar with [facility] or the facilities. She denied having any neighbors or other family to visit. CSW conducted chart review and found a daughter listed. Patient stated she has also changed her number but she lives in [place]. She provided info as: [name] [phone]. CSW called and left a voicemail. Will hope that daughter calls back as patient stated she will otherwise just choose the highest rated facility near her home.   3:52 PM-CSW received call from patient's daughter. She stated that she was unaware that patient had been hospitalized. Patient has been calling her but she has not called her back due to them being recently estranged. She stated patient is now wanting her help now that she is sick. She agreed to help patient choose the SNF. CSW securely emailed her the offers to [email or web address].    Will being insurance process pending a facility choice, Ref # [id number].   Skilled Nursing Rehab Facilities-   [email or web address]   Ratings out of 5 stars (5 the highest)  Name Address  Phone # Quality Care Staffing Health Inspection Overall  [place] [facility] [address], [place] [phone] 3 1 4 3   [facility] [address], [place] [phone] 5 2 4 5   [facility] [address], [facility] [phone] 2 1 1 1   [facility] [address], [place] [phone] 4 3 4 4   [facility]  [address], [place] [phone] 5 3 5 5   [facility] [address], [facility] [phone] 5 3 2 3   [facility] [address] [place] [phone] 5 1 2 2   [facility] [phone] N[address], [place] [phone] 2 3 4 4   [address] ([place]) [address], [place] [phone] 1 3 3 2   [facility] [address] [place], [place] [phone] 3 1 2 1   [facility] [address], [place] [phone] 3 1 1 1   Clotilda Pereyra [address] [place] [phone] 3 3 4 4   [facility] [address] [place] [phone] 4 1 2 1   [facility] ([place]) [address], [place] [phone] 1 1 4 2           [facility] [address], [place] [phone] 3 1 5 4   [facility] ([place]) [address], [place] [phone] 4 1 1 1   [facility]  [address] [place] [phone] 2 3 1 1   [facility] [address], [place] [phone] 2 2 4 4   [facility] [address], [place] [phone] 2 2 3 3           [facility] [address] [place] [phone] 2 1 2 1   [facility] (No [facility]) [address], [place] [phone] 5 4 4 5   [facility] [address] [place] [phone] 3 4 4 4   [facility] [address] [place] [phone]  3 1 2 1   Marion[facility] Milian [address] [place] [phone] 3 2 2 2   [facility] [address], [place] [phone] 1 3 3 2   [facility] [address] [place] [phone] 2 2 3 3   [facility] [address] [place], [place] [phone] 2 1 4 3   [facility] [address], [facility] [phone] 2 1 1 1   [facility] [address] [place], [place] [phone] 2 1 2 1   [facility] [address] [place] [address] [place] [phone] 3 1 2 1           [facility] [address] [place] [phone] 4 1 2 1   [facility] [address] [place]  [phone] 3 4 4 4   [facility] (No Humana) [address], [place] [phone] 3 2 5 5   [facility]) [address], [place] [phone] 2 2 3 3   [facility] [address], [place] [phone] 5 3 5 5   [facility] [address] [place], [place] [phone] [facility] [address] [place], [place] [phone] 3 5 5 5           [facility] [address] [place], [place] [phone] 5 4 5 5   [facility])  [address] [place] [phone] 1 1 2 1   [facility]) [address] [place] [phone]  2 4 4   [facility] [address], [place] [phone] 3 5 5 5   [address] [place], [place] [phone] 4 2 2 2   [facility]) [address] [place] [phone] 1 1 3 1   [facility] [address] [place] [phone] 2 2 2 2       Expected Discharge Plan: Skilled Nursing Facility Barriers to Discharge: Continued Medical Work up, English as a second language teacher               Expected Discharge Plan and Services In-house Referral: Clinical Social Work     Living arrangements for the past 2 months: Single Family Home                                       Social Drivers of Health  (SDOH) Interventions SDOH Screenings   Food Insecurity: No Food Insecurity (12/13/2023)  Housing: Low Risk  (12/13/2023)  Transportation Needs: No Transportation Needs (12/13/2023)  Utilities: Not At Risk (12/13/2023)  Depression (PHQ2-9): Low Risk  (06/04/2021)  Social Connections: Unknown (12/13/2023)  Tobacco Use: Medium Risk (12/10/2023)    Readmission Risk Interventions     No data to display

## 2023-12-13 NOTE — Procedures (Signed)
  Procedure:  FLuoro guided bone marrow biopsy L iliac Preprocedure diagnosis: The primary encounter diagnosis was Anemia, unspecified type. A diagnosis of Gastrointestinal hemorrhage, unspecified gastrointestinal hemorrhage type was also pertinent to this visit. Postprocedure diagnosis: same EBL:    minimal Complications:   none immediate  See full dictation in [employer].  CHARM Toribio Faes MD Main # [phone] Pager  [phone] Mobile [phone]

## 2023-12-14 DIAGNOSIS — D649 Anemia, unspecified: Secondary | ICD-10-CM | POA: Diagnosis not present

## 2023-12-14 LAB — CBC
HCT: 23.6 % — ABNORMAL LOW (ref 36.0–46.0)
Hemoglobin: 7.7 g/dL — ABNORMAL LOW (ref 12.0–15.0)
MCH: 31 pg (ref 26.0–34.0)
MCHC: 32.6 g/dL (ref 30.0–36.0)
MCV: 95.2 fL (ref 80.0–100.0)
Platelets: 123 K/uL — ABNORMAL LOW (ref 150–400)
RBC: 2.48 MIL/uL — ABNORMAL LOW (ref 3.87–5.11)
RDW: 15.7 % — ABNORMAL HIGH (ref 11.5–15.5)
WBC: 5.4 K/uL (ref 4.0–10.5)
nRBC: 0 % (ref 0.0–0.2)

## 2023-12-14 NOTE — TOC Progression Note (Addendum)
 Transition of Care [facility]) - Progression Note    Patient Details  Name: [name] MRN: [id number] Date of Birth: 1946-01-03  Transition of Care [facility]) CM/SW Contact  [name], LCSW Phone Number: 12/14/2023, 12:23 PM  Clinical Narrative:    11am-[facility] able to accept patient tomorrow. Updated daughter and insurance; authorization still pending.   12:30 PM-Insurance approval received, Ref# [id number], effective 12/14/2023-12/18/2023.   Expected Discharge Plan: Skilled Nursing Facility Barriers to Discharge: Continued Medical Work up, English as a second language teacher               Expected Discharge Plan and Services In-house Referral: Clinical Social Work     Living arrangements for the past 2 months: Single Family Home                                       Social Drivers of Health (SDOH) Interventions SDOH Screenings   Food Insecurity: No Food Insecurity (12/13/2023)  Housing: Low Risk  (12/13/2023)  Transportation Needs: No Transportation Needs (12/13/2023)  Utilities: Not At Risk (12/13/2023)  Depression (PHQ2-9): Low Risk  (06/04/2021)  Social Connections: Unknown (12/13/2023)  Tobacco Use: Medium Risk (12/10/2023)    Readmission Risk Interventions     No data to display

## 2023-12-14 NOTE — Care Management Important Message (Signed)
 Important Message  Patient Details  Name: [name] MRN: [id number] Date of Birth: November 15, 1945   Important Message Given:  Yes - Medicare IM     Claretta Deed 12/14/2023, 4:21 PM

## 2023-12-14 NOTE — Progress Notes (Signed)
 PROGRESS NOTE    [name]  FMW:[id number] DOB: October 13, 1945 DOA: 12/10/2023 PCP: Pcp, No     Brief Narrative:  [name] is a 78 y.o. female with medical history significant of hypertension, hyperlipidemia, CVA, TIA, COPD, DVT, intraventricular hemorrhage presenting after multiple falls at home.  This happened on the day of admission about 4-5 times, in the ER she was found to have severe anemia, head CT was nonacute, CT abdomen pelvis and chest nonacute, she was seen by GI who recommended hematological workup for anemia. She underwent bone marrow biopsy 8/6.   New events last 24 hours / Subjective: No complaints, ate breakfast today. Awaiting SNF placement.   Assessment & Plan:   Principal Problem:   Symptomatic anemia Active Problems:   Dyslipidemia   History of TIA (transient ischemic attack) and stroke   Essential hypertension   Low folate   Severe symptomatic anemia - S/p 2 unit pRBC 8/3 - Had weakly heme positive brown stool, GI consulted, no inpatient endoscopic eval planned  - Hematology consulted.  S/p bone marrow biopsy 8/6.  She will follow-up with Dr. Lanny as outpatient  Multiple falls at home - PT OT recommending SNF placement  Nonspecific changes on CT of infrarenal aortic aneurysm and subpleural left lung nodule  - Outpatient follow up   Dyslipidemia - Crestor    Hx stroke, ICH  HTN - Norvasc , hydralazine     DVT prophylaxis:  SCDs Start: 12/10/23 1453  Code Status: Full Family Communication: None at bedside Disposition Plan:  SNF  Status is: Inpatient Remains inpatient appropriate because: SNF placement pending     Antimicrobials:  Anti-infectives (From admission, onward)    None        Objective: Vitals:   12/13/23 2311 12/14/23 0000 12/14/23 0330 12/14/23 0800  BP: (!) 88/54 (!) 105/58 117/62 139/68  Pulse: 67 62 (!) 59 63  Resp: 19 18 13 17   Temp: 97.9 F (36.6 C)  98.4 F (36.9 C) (!) 97.5 F (36.4 C)  TempSrc: Oral  Oral  Oral  SpO2: 99% 99% 99% 96%  Weight:      Height:        Intake/Output Summary (Last 24 hours) at 12/14/2023 1204 Last data filed at 12/14/2023 0520 Gross per 24 hour  Intake --  Output 300 ml  Net -300 ml   Filed Weights   12/10/23 0930  Weight: 87.1 kg    Examination:  General exam: Appears calm and comfortable  Respiratory system: Clear to auscultation. Respiratory effort normal. No respiratory distress. No conversational dyspnea.  Cardiovascular system: S1 & S2 heard, RRR. No murmurs. No pedal edema. Gastrointestinal system: Abdomen is nondistended, soft and nontender. Normal bowel sounds heard. Extremities: Symmetric in appearance  Skin: No rashes, lesions or ulcers on exposed skin   Data Reviewed: I have personally reviewed following labs and imaging studies  CBC: Recent Labs  Lab 12/10/23 1200 12/10/23 1207 12/11/23 0329 12/11/23 0618 12/12/23 0859 12/13/23 0602 12/14/23 0542  WBC 7.5  --  6.4 7.0 5.5 6.4 5.4  NEUTROABS 5.1  --   --  4.9 3.6 4.3  --   HGB 5.7*   < > 7.6* 8.0* 7.5* 7.6* 7.7*  HCT 17.5*   < > 23.0* 23.7* 22.7* 23.0* 23.6*  MCV 93.6  --  92.4 91.5 93.4 93.1 95.2  PLT 78*  --  74* 80* 100* 111* 123*   < > = values in this interval not displayed.   Basic Metabolic Panel: Recent Labs  Lab 12/10/23 1151 12/10/23 1200 12/10/23 1207 12/11/23 0329 12/13/23 0602  NA 136 136 139 140 137  K 3.9 3.1* 3.2* 3.6 3.5  CL 106 104 104 108 106  CO2  --  26  --  23 24  GLUCOSE 92 87 90 89 83  BUN 49* 39* 39* 28* 16  CREATININE 1.00 0.96 1.00 0.81 0.74  CALCIUM   --  8.7*  --  8.3* 8.6*  MG  --  2.0  --   --  1.8   GFR: Estimated Creatinine Clearance: 68.3 mL/min (by C-G formula based on SCr of 0.74 mg/dL). Liver Function Tests: Recent Labs  Lab 12/10/23 1200 12/11/23 0329  AST 36 29  ALT 17 16  ALKPHOS 40 43  BILITOT 0.8 1.0  PROT 5.1* 5.6*  ALBUMIN 3.4* 3.0*   No results for input(s): LIPASE, AMYLASE in the last 168 hours. No results  for input(s): AMMONIA in the last 168 hours. Coagulation Profile: Recent Labs  Lab 12/10/23 1200  INR 1.4*   Cardiac Enzymes: Recent Labs  Lab 12/10/23 1200  CKTOTAL 762*   BNP (last 3 results) No results for input(s): PROBNP in the last 8760 hours. HbA1C: No results for input(s): HGBA1C in the last 72 hours. CBG: No results for input(s): GLUCAP in the last 168 hours. Lipid Profile: No results for input(s): CHOL, HDL, LDLCALC, TRIG, CHOLHDL, LDLDIRECT in the last 72 hours. Thyroid Function Tests: No results for input(s): TSH, T4TOTAL, FREET4, T3FREE, THYROIDAB in the last 72 hours. Anemia Panel: No results for input(s): VITAMINB12, FOLATE, FERRITIN, TIBC, IRON, RETICCTPCT in the last 72 hours.  Sepsis Labs: No results for input(s): PROCALCITON, LATICACIDVEN in the last 168 hours.  No results found for this or any previous visit (from the past 240 hours).    Radiology Studies: IR BONE MARROW BIOPSY & ASPIRATION Result Date: 12/13/2023 CLINICAL DATA:  Anemia EXAM: FLUOROSCOPIC GUIDED DEEP ILIAC BONE ASPIRATION AND CORE BIOPSY TECHNIQUE: Patient was placed prone on the fluoro table and limited images through the pelvis were obtained. Appropriate skin entry site was identified. Skin site was marked, prepped with chlorhexidine , draped in usual sterile fashion, and infiltrated locally with 1% lidocaine . Intravenous Fentanyl  50mcg and Versed  1mg  were administered as conscious sedation during continuous monitoring of the patient's level of consciousness and physiological / cardiorespiratory status by the radiology RN, with a total moderate sedation time of 10 minutes. Under fluoroscopic guidance an 11-gauge trocar bone needle was advanced into the left iliac bone just lateral to the sacroiliac joint. Once needle tip position was confirmed, core and aspiration samples were obtained, submitted to pathology for approval. Patient tolerated procedure  well. COMPLICATIONS: COMPLICATIONS none IMPRESSION: 1. Technically successful fluoro guided left iliac bone core and aspiration biopsy. Electronically Signed   By: JONETTA Faes M.D.   On: 12/13/2023 13:57      Scheduled Meds:  amLODipine   10 mg Oral Daily   vitamin B-12  1,000 mcg Oral Daily   hydrALAZINE   50 mg Oral Q8H   pantoprazole   40 mg Oral Daily   rosuvastatin   20 mg Oral Daily   sodium chloride  flush  3 mL Intravenous Q12H   Continuous Infusions:   LOS: 3 days   Time spent: 20 minutes   Delon Hoe, DO [facility] 12/14/2023, 12:04 PM   Available via Epic secure chat 7am-7pm After these hours, please refer to coverage provider listed on [email or web address]

## 2023-12-14 NOTE — Progress Notes (Signed)
 Physical Therapy Treatment Patient Details Name: [name] MRN: [id number] DOB: 03-12-1946 Today's Date: 12/14/2023   History of Present Illness Pt is a 78 y/o female presenting 8/3 after suffering 4-5 falls a day for the past couple of days.  Work up included anemia with Hgb in the 5's. Bone biopsy 12/13/23  PMHx:  HTN, HLD, CVA, COPD, DVT, IVH.    PT Comments  Pt agreeable to participate in physical therapy session. Utilized [facility] for pre transfer training and also to increase functional strengthening. Pt requiring modA+2 for bed mobility and transitions to standing using lift equipment. Continues with generalized weakness/debility and impaired sitting balance. Patient will benefit from continued inpatient follow up therapy, <3 hours/day.    If plan is discharge home, recommend the following: A lot of help with walking and/or transfers;A lot of help with bathing/dressing/bathroom;Assistance with cooking/housework;Assist for transportation   Can travel by private vehicle     No  Equipment Recommendations  None recommended by PT    Recommendations for Other Services       Precautions / Restrictions Precautions Precautions: Fall Restrictions Weight Bearing Restrictions Per Provider Order: No     Mobility  Bed Mobility Overal bed mobility: Needs Assistance Bed Mobility: Supine to Sit     Supine to sit: Mod assist, +2 for physical assistance     General bed mobility comments: increased time, LLE assist cues for reaching over with RUE for rail, but pt not using RUE functionally to push off and elevate trunk    Transfers Overall transfer level: Needs assistance Equipment used: Ambulation equipment used Transfers: Sit to/from Stand, Bed to chair/wheelchair/BSC Sit to Stand: +2 physical assistance, Mod assist           General transfer comment: Pt able to grip Stedy with bilateral hands, rocking forward with emphasis to gain momentum, max cueing for upright posture and  glute activation to achieve enough extension to place Stedy flaps. Pt performed multiple times on and off bed and [facility]    Ambulation/Gait                   Stairs             Wheelchair Mobility     Tilt Bed    Modified Rankin (Stroke Patients Only)       Balance Overall balance assessment: Needs assistance   Sitting balance-Leahy Scale: Poor Sitting balance - Comments: CGA-minA, reliant on RUE support. Loss of balance posteriorly                                    Communication Communication Communication: Impaired Factors Affecting Communication: Hearing impaired  Cognition Arousal: Alert Behavior During Therapy: Flat affect   PT - Cognitive impairments: No family/caregiver present to determine baseline                       PT - Cognition Comments: Mostly flat affect, but did smile once or twice to a joke. Limited verbalizations Following commands: Intact      Cueing Cueing Techniques: Verbal cues  Exercises General Exercises - Lower Extremity Heel Slides: AAROM, Left, 5 reps, Supine Other Exercises Other Exercises: Supine: L hamstring stretch x 1 minute    General Comments        Pertinent Vitals/Pain Pain Assessment Pain Assessment: No/denies pain    Home Living  Prior Function            PT Goals (current goals can now be found in the care plan section) Acute Rehab PT Goals Potential to Achieve Goals: Fair Progress towards PT goals: Progressing toward goals    Frequency    Min 2X/week      PT Plan      Co-evaluation              AM-PAC PT 6 Clicks Mobility   Outcome Measure  Help needed turning from your back to your side while in a flat bed without using bedrails?: A Lot Help needed moving from lying on your back to sitting on the side of a flat bed without using bedrails?: A Lot Help needed moving to and from a bed to a chair (including a  wheelchair)?: Total Help needed standing up from a chair using your arms (e.g., wheelchair or bedside chair)?: Total Help needed to walk in hospital room?: Total Help needed climbing 3-5 steps with a railing? : Total 6 Click Score: 8    End of Session Equipment Utilized During Treatment: Gait belt Activity Tolerance: Patient tolerated treatment well;Patient limited by fatigue Patient left: in chair;with call bell/phone within reach;with chair alarm set Nurse Communication: Mobility status;Need for lift equipment PT Visit Diagnosis: Other abnormalities of gait and mobility (R26.89);Repeated falls (R29.6);Muscle weakness (generalized) (M62.81);Difficulty in walking, not elsewhere classified (R26.2)     Time: 8861-8794 PT Time Calculation (min) (ACUTE ONLY): 27 min  Charges:    $Therapeutic Activity: 23-37 mins PT General Charges $$ ACUTE PT VISIT: 1 Visit                     Aleck Daring, PT, DPT Acute Rehabilitation Services Office [phone]    Alayne ONEIDA Daring 12/14/2023, 12:16 PM

## 2023-12-15 ENCOUNTER — Other Ambulatory Visit: Payer: Self-pay | Admitting: Hematology

## 2023-12-15 DIAGNOSIS — D649 Anemia, unspecified: Secondary | ICD-10-CM | POA: Diagnosis not present

## 2023-12-15 LAB — CBC
HCT: 25.1 % — ABNORMAL LOW (ref 36.0–46.0)
Hemoglobin: 8 g/dL — ABNORMAL LOW (ref 12.0–15.0)
MCH: 30.9 pg (ref 26.0–34.0)
MCHC: 31.9 g/dL (ref 30.0–36.0)
MCV: 96.9 fL (ref 80.0–100.0)
Platelets: 133 K/uL — ABNORMAL LOW (ref 150–400)
RBC: 2.59 MIL/uL — ABNORMAL LOW (ref 3.87–5.11)
RDW: 15.8 % — ABNORMAL HIGH (ref 11.5–15.5)
WBC: 5.7 K/uL (ref 4.0–10.5)
nRBC: 0 % (ref 0.0–0.2)

## 2023-12-15 MED ORDER — PANTOPRAZOLE SODIUM 40 MG PO TBEC: 40.0000 mg | DELAYED_RELEASE_TABLET | Freq: Every day | ORAL | 0 refills | Status: AC

## 2023-12-15 MED ORDER — POLYETHYLENE GLYCOL 3350 17 G PO PACK: 17.0000 g | PACK | Freq: Every day | ORAL | 0 refills | Status: AC

## 2023-12-15 MED ORDER — HYDRALAZINE HCL 50 MG PO TABS: 50.0000 mg | ORAL_TABLET | Freq: Three times a day (TID) | ORAL | 0 refills | Status: AC

## 2023-12-15 MED ORDER — POLYETHYLENE GLYCOL 3350 17 G PO PACK
17.0000 g | PACK | Freq: Every day | ORAL | Status: DC
  Administered 2023-12-15: 17 g via ORAL
  Filled 2023-12-15: qty 1

## 2023-12-15 MED ORDER — AMLODIPINE BESYLATE 10 MG PO TABS: 10.0000 mg | ORAL_TABLET | Freq: Every day | ORAL | 0 refills | Status: AC

## 2023-12-15 MED ORDER — SENNA 8.6 MG PO TABS: 1.0000 | ORAL_TABLET | Freq: Every day | ORAL | Status: DC

## 2023-12-15 NOTE — Progress Notes (Addendum)
 Report given on pt to [facility].

## 2023-12-15 NOTE — Plan of Care (Signed)
   Problem: Education: Goal: Knowledge of General Education information will improve Description Including pain rating scale, medication(s)/side effects and non-pharmacologic comfort measures Outcome: Progressing   Problem: Health Behavior/Discharge Planning: Goal: Ability to manage health-related needs will improve Outcome: Progressing

## 2023-12-15 NOTE — Discharge Summary (Signed)
 Physician Discharge Summary  [name] FMW:[id number] DOB: November 30, 1945 DOA: 12/10/2023  PCP: Pcp, No  Admit date: 12/10/2023 Discharge date: 12/15/2023  Admitted From: Home Disposition:  SNF   Recommendations for Outpatient Follow-up:  Follow up with PCP  Follow-up with hematology/oncology, Dr. Lanny for bone marrow biopsy results Follow-up with GI outpatient  Discharge Condition: Stable CODE STATUS: Full code Diet recommendation:  Diet Orders (From admission, onward)     Start     Ordered   12/15/23 0000  Diet - low sodium heart healthy        12/15/23 9047   12/12/23 1319  Diet Heart Room service appropriate? No; Fluid consistency: Thin  Diet effective now       Question Answer Comment  Room service appropriate? No   Fluid consistency: Thin      12/12/23 1318           Brief/Interim Summary: [name] is a 78 y.o. female with medical history significant of hypertension, hyperlipidemia, CVA, TIA, COPD, DVT, intraventricular hemorrhage presenting after multiple falls at home.  This happened on the day of admission about 4-5 times, in the ER she was found to have severe anemia, head CT was nonacute, CT abdomen pelvis and chest nonacute, she was seen by GI who recommended hematological workup for anemia. She underwent bone marrow biopsy 8/6.  Patient's hemoglobin remained clinically stable.  She was discharged to skilled nursing facility and will follow-up with Dr. Lanny as outpatient.  Discharge Diagnoses:  Principal Problem:   Symptomatic anemia Active Problems:   Dyslipidemia   History of TIA (transient ischemic attack) and stroke   Essential hypertension   Low folate   Severe symptomatic anemia - S/p 2 unit pRBC 8/3 - Had weakly heme positive brown stool, GI consulted, no inpatient endoscopic eval planned  - Hematology consulted.  S/p bone marrow biopsy 8/6.  She will follow-up with Dr. Lanny as outpatient   Multiple falls at home - PT OT recommending SNF placement    Nonspecific changes on CT of infrarenal aortic aneurysm and subpleural left lung nodule  - Outpatient follow up    Dyslipidemia - Crestor     Hx stroke, ICH   HTN - Norvasc , hydralazine      Discharge Instructions  Discharge Instructions     Ambulatory referral to Hematology / Oncology   Complete by: As directed    Diet - low sodium heart healthy   Complete by: As directed    Increase activity slowly   Complete by: As directed    No wound care   Complete by: As directed       Allergies as of 12/15/2023   No Known Allergies      Medication List     STOP taking these medications    aspirin  EC 81 MG tablet   cephALEXin  500 MG capsule Commonly known as: KEFLEX    HYDROcodone -acetaminophen  5-325 MG tablet Commonly known as: NORCO/VICODIN   predniSONE  20 MG tablet Commonly known as: DELTASONE    valsartan  40 MG tablet Commonly known as: DIOVAN        TAKE these medications    amLODipine  10 MG tablet Commonly known as: NORVASC  Take 1 tablet (10 mg total) by mouth daily. What changed:  medication strength See the new instructions.   hydrALAZINE  50 MG tablet Commonly known as: APRESOLINE  Take 1 tablet (50 mg total) by mouth every 8 (eight) hours.   pantoprazole  40 MG tablet Commonly known as: PROTONIX  Take 1 tablet (40 mg total) by  mouth daily.   polyethylene glycol 17 g packet Commonly known as: MIRALAX  / GLYCOLAX  Take 17 g by mouth daily.   rosuvastatin  20 MG tablet Commonly known as: CRESTOR  TAKE 1 TABLET(20 MG) BY MOUTH DAILY What changed: See the new instructions.        Contact information for follow-up providers     Lanny Callander, MD. Call.   Specialties: Hematology, Oncology Why: Follow up for bone marrow biopsy results Contact information: [address] [place] [address] [phone]         [facility] Follow up.   Specialty: Gastroenterology Why: If symptoms worsen, As needed Contact  information: [address] [place]  [address] [phone]             Contact information for after-discharge care     Destination     [facility], [place] .   Service: Skilled Nursing Contact information: [address] [place]  [phone]                    No Known Allergies  Procedures/Studies: IR BONE MARROW BIOPSY & ASPIRATION Result Date: 12/13/2023 CLINICAL DATA:  Anemia EXAM: FLUOROSCOPIC GUIDED DEEP ILIAC BONE ASPIRATION AND CORE BIOPSY TECHNIQUE: Patient was placed prone on the fluoro table and limited images through the pelvis were obtained. Appropriate skin entry site was identified. Skin site was marked, prepped with chlorhexidine , draped in usual sterile fashion, and infiltrated locally with 1% lidocaine . Intravenous Fentanyl  50mcg and Versed  1mg  were administered as conscious sedation during continuous monitoring of the patient's level of consciousness and physiological / cardiorespiratory status by the radiology RN, with a total moderate sedation time of 10 minutes. Under fluoroscopic guidance an 11-gauge trocar bone needle was advanced into the left iliac bone just lateral to the sacroiliac joint. Once needle tip position was confirmed, core and aspiration samples were obtained, submitted to pathology for approval. Patient tolerated procedure well. COMPLICATIONS: COMPLICATIONS none IMPRESSION: 1. Technically successful fluoro guided left iliac bone core and aspiration biopsy. Electronically Signed   By: JONETTA Faes M.D.   On: 12/13/2023 13:57   CT CHEST ABDOMEN PELVIS W CONTRAST Result Date: 12/10/2023 EXAM: CT CHEST, ABDOMEN AND PELVIS WITH CONTRAST 12/10/2023 01:43:26 PM TECHNIQUE: CT of the chest, abdomen and pelvis was performed with the administration of intravenous contrast. Multiplanar reformatted images are provided for review. Automated exposure control, iterative reconstruction, and/or  weight based adjustment of the mA/kV was utilized to reduce the radiation dose to as low as reasonably achievable. COMPARISON: 11/16/2010 CLINICAL HISTORY: Polytrauma, blunt; Multiple falls, pain. Triage notes; Pt arrived by EMS from home. Reports that she has had multi falls over past 1 1/2 weeks. Denies hitting head. No anticoagulant use. Pt has positive recent history of UTI dx, but keflex  bottle found in home was still full. Hydro/acet was empty. Pt c/o ; increased weakness and loss of independence. Lives at home with daughter and other family members. Pt w/hx CVA + L sided weakness. Presents with multiple bruising over entire body predominantly on L hip. FINDINGS: CHEST: MEDIASTINUM: Heart and pericardium are unremarkable. The central airways are clear. Small hiatal hernia. THORACIC LYMPH NODES: No mediastinal, hilar or axillary lymphadenopathy. LUNGS AND PLEURA: No focal consolidation or pulmonary edema. No pleural effusion or pneumothorax. Pleural calcifications are identified overlying the left lung, unchanged from the previous exam. Chronic band-like area of scarring is noted within the posterior and medial left lower lobe. Subpleural nodule in  the posterior left lung base measures 0.9 x 0.6 cm, new from previous exam. ABDOMEN AND PELVIS: LIVER: The liver is unremarkable. GALLBLADDER AND BILE DUCTS: Gallbladder is unremarkable. No biliary ductal dilatation. SPLEEN: No acute abnormality. PANCREAS: No acute abnormality. ADRENAL GLANDS: No acute abnormality. KIDNEYS, URETERS AND BLADDER: No stones in the kidneys or ureters. No hydronephrosis. No perinephric or periureteral stranding. Urinary bladder is unremarkable. GI AND BOWEL: Stomach demonstrates no acute abnormality. There is no bowel obstruction. The appendix is visualized and appears normal. REPRODUCTIVE ORGANS: No acute abnormality. PERITONEUM AND RETROPERITONEUM: No ascites. No free air. VASCULATURE: Aortic atherosclerotic calcification. Focal  penetrating atherosclerotic ulceration noted along the undersurface of the aortic arch measuring 1.7 x 0.8 cm, sagittal image 89/7. Coronary artery calcifications. The infrarenal abdominal aorta measures 4.4 x 4.4 cm. 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 ABDOMINAL AND PELVIS LYMPH NODES: No lymphadenopathy. BONES AND SOFT TISSUES: Thoracic degenerative disc disease. There is asymmetric soft tissue edema and subcutaneous fat stranding within the soft tissues overlying the lateral aspect and anterior aspect of the left hip and image portions of the proximal left lower extremity. Overlying skin thickening noted. Mild subcutaneous soft tissue stranding and skin thickening overlies the lateral aspect right hip. IMPRESSION: 1. No acute findings in the chest, abdomen, and pelvis related to the polytrauma and multiple falls. 2. 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 Follow up in 12 months and referral to vascular surgery. 3. 9x6 mm subpleural nodule within the left base appears smoothly marginated and may represent a benign intrapulmonary lymph node. Initial follow-up examination in 3 to 6 months is recommended. 4. Asymmetric soft tissue edema and subcutaneous fat stranding overlying the lateral and anterior left hip and proximal left lower extremity, with overlying skin thickening. Mild subcutaneous soft tissue stranding and skin thickening overlies the lateral aspect of the right hip. These findings may reflect superficial soft tissue contusion, or anasarca. Clinical correlation advised. Electronically signed by: Waddell Calk MD 12/10/2023 02:04 PM EDT RP Workstation: [id number]   CT Cervical Spine Wo Contrast Result Date: 12/10/2023 CLINICAL DATA:  78 year old female with multiple falls recently. Increased weakness. EXAM: CT CERVICAL SPINE WITHOUT CONTRAST TECHNIQUE: Multidetector  CT imaging of the cervical spine was performed without intravenous contrast. Multiplanar CT image reconstructions were also generated. RADIATION DOSE REDUCTION: This exam was performed according to the departmental dose-optimization program which includes automated exposure control, adjustment of the mA and/or kV according to patient size and/or use of iterative reconstruction technique. COMPARISON:  Head CT today.  CTA head and neck 09/11/2020. FINDINGS: Alignment: Stable straightening of lordosis. Cervicothoracic junction alignment is within normal limits. Bilateral posterior element alignment is within normal limits. Skull base and vertebrae: Bone mineralization is within normal limits for age. Visualized skull base is intact. No atlanto-occipital dissociation. C1 and C2 appear intact and aligned. No acute osseous abnormality identified. Soft tissues and spinal canal: No prevertebral fluid or swelling. No visible canal hematoma. Advanced chronic cervical carotid atherosclerosis. New right side carotid vascular stent since 2022. Disc levels: Diffuse idiopathic skeletal hyperostosis (DISH). Associated interbody ankylosis C3 through C7, and likely developing also at the cervicothoracic junction. Associated bulky disc and endplate degeneration in the cervical spine, but no CT evidence of spinal stenosis. Upper chest: Calcified aortic atherosclerosis. Otherwise negative visible noncontrast thoracic inlet. IMPRESSION: 1. No acute traumatic injury identified in the cervical spine. 2. DISH  with widespread cervical intervertebral ankylosis. 3. Cervical right carotid artery stenting since 2022 CTA. Aortic Atherosclerosis (ICD10-I70.0). Electronically Signed   By: VEAR Hurst M.D.   On: 12/10/2023 12:35   CT Head Wo Contrast Result Date: 12/10/2023 CLINICAL DATA:  78 year old female with multiple falls recently. Increased weakness. EXAM: CT HEAD WITHOUT CONTRAST TECHNIQUE: Contiguous axial images were obtained from the base  of the skull through the vertex without intravenous contrast. RADIATION DOSE REDUCTION: This exam was performed according to the departmental dose-optimization program which includes automated exposure control, adjustment of the mA and/or kV according to patient size and/or use of iterative reconstruction technique. COMPARISON:  Brain MRI 09/10/2020.  Head CT 09/16/2020. FINDINGS: Brain: Progressive encephalomalacia posterior right MCA territory where MRI was abnormal in 2022. Ex vacuo ventricular enlargement now. Punctate dystrophic calcification associated there. Resolved small volume intracranial hemorrhage seen on prior CT. New since that time but circumscribed and chronic appearing lacunar infarct medial right lentiform (series 6, image 14). Contralateral left basal ganglia vascular calcification is stable. No midline shift, ventriculomegaly, mass effect, evidence of mass lesion, intracranial hemorrhage or evidence of cortically based acute infarction. Vascular: Calcified atherosclerosis at the skull base. No suspicious intracranial vascular hyperdensity. Skull: No acute osseous abnormality identified. Sinuses/Orbits: Substantially progressed bilateral paranasal sinus periosteal thickening compared to the prior CT. Generalized paranasal sinus mucosal thickening is similar. Tympanic cavities and mastoids well aerated. Small fluid levels in the paranasal sinuses. Other: Disconjugate gaze. No acute orbit or scalp soft tissue finding. IMPRESSION: 1. No acute intracranial abnormality or acute traumatic injury identified. 2. Progressed chronic ischemic disease since 2022 post ischemic encephalomalacia in the right MCA territory. 3. Acute and chronic appearing bilateral paranasal sinus inflammation has progressed since 2022. Electronically Signed   By: VEAR Hurst M.D.   On: 12/10/2023 12:31   DG FEMUR MIN 2 VIEWS LEFT Result Date: 12/10/2023 CLINICAL DATA:  809823 Fall [id number] Pain 144615. EXAM: LEFT FEMUR 2  VIEWS COMPARISON:  None Available. FINDINGS: No acute fracture or dislocation. No aggressive osseous lesion. There are mild degenerative changes of the hip joint without significant joint space narrowing. Osteophytosis of the superior acetabulum. Mild degenerative changes of the knee joint. No focal soft tissue swelling. No radiopaque foreign bodies. IMPRESSION: *No acute osseous abnormality of the left femur. Electronically Signed   By: Ree Molt M.D.   On: 12/10/2023 11:47   DG Forearm Left Result Date: 12/10/2023 CLINICAL DATA:  809823 Fall [id number] Pain 144615 EXAM: LEFT FOREARM - 2 VIEW COMPARISON:  None Available. FINDINGS: There is diffuse osteopenia of the visualized osseous structures. No acute fracture or dislocation. No aggressive osseous lesion. Note is made of negative ulnar variance. Mild diffuse degenerative changes of imaged joints. No radiopaque foreign bodies. Soft tissues are within normal limits. IMPRESSION: No acute osseous abnormality of the left forearm. Electronically Signed   By: Ree Molt M.D.   On: 12/10/2023 11:39   DG Shoulder Left Result Date: 12/10/2023 CLINICAL DATA:  fall. EXAM: LEFT SHOULDER - 2+ VIEW COMPARISON:  None Available. FINDINGS: No acute fracture or dislocation. No aggressive osseous lesion. Glenohumeral and acromioclavicular joints are normal in alignment. Moderate osteoarthritis of the acromioclavicular joint. Mild osteoarthritis of the glenohumeral joint. No soft tissue swelling. No radiopaque foreign bodies. IMPRESSION: No acute osseous abnormality of the left shoulder joint. Electronically Signed   By: Ree Molt M.D.   On: 12/10/2023 11:37   DG Pelvis 1-2 Views Result Date: 12/10/2023 CLINICAL DATA:  Fall. EXAM:  PELVIS - 1-2 VIEW COMPARISON:  None Available. FINDINGS: Pelvis is intact with normal and symmetric sacroiliac joints. No acute fracture or dislocation. No aggressive osseous lesion. Visualized sacral arcuate lines are unremarkable.  Unremarkable symphysis pubis. There are mild degenerative changes of bilateral hip joints without significant joint space narrowing. Osteophytosis of the superior acetabulum. No radiopaque foreign bodies. IMPRESSION: Negative. Electronically Signed   By: Ree Molt M.D.   On: 12/10/2023 11:36   DG Hand Complete Left Result Date: 12/10/2023 CLINICAL DATA:  Fall.  Pain. EXAM: LEFT HAND - COMPLETE 3+ VIEW COMPARISON:  None Available. FINDINGS: There is diffuse osteopenia of the visualized osseous structures. No acute fracture or dislocation. No aggressive osseous lesion. Mild diffuse degenerative osteoarthritis of imaged joints. No radiopaque foreign bodies. Soft tissues are within normal limits. IMPRESSION: No acute osseous abnormality of the left hand. Electronically Signed   By: Ree Molt M.D.   On: 12/10/2023 11:36   VAS US  LOWER EXTREMITY VENOUS (DVT) (7a-7p) Result Date: 12/06/2023  Lower Venous DVT Study Patient Name:  VITALIA STOUGH  Date of Exam:   12/05/2023 Medical Rec #: [id number]    Accession #:    [id number] Date of Birth: 27-Aug-1945   Patient Gender: F Patient Age:   60 years Exam Location:  [facility] Procedure:      VAS US  LOWER EXTREMITY VENOUS (DVT) Referring Phys: FONDA GEIPLE --------------------------------------------------------------------------------  Indications: Pain.  Risk Factors: DVT Hx of LE DVT. Performing Technologist: Elmarie Lindau, RVT  Examination Guidelines: A complete evaluation includes B-mode imaging, spectral Doppler, color Doppler, and power Doppler as needed of all accessible portions of each vessel. Bilateral testing is considered an integral part of a complete examination. Limited examinations for reoccurring indications may be performed as noted. The reflux portion of the exam is performed with the patient in reverse Trendelenburg.  +---------+---------------+---------+-----------+----------+--------------+ RIGHT     CompressibilityPhasicitySpontaneityPropertiesThrombus Aging +---------+---------------+---------+-----------+----------+--------------+ CFV      Full           Yes      Yes                                 +---------+---------------+---------+-----------+----------+--------------+ SFJ      Full                                                        +---------+---------------+---------+-----------+----------+--------------+ FV Prox  Partial                                      Chronic        +---------+---------------+---------+-----------+----------+--------------+ FV Mid   Partial                                      Chronic        +---------+---------------+---------+-----------+----------+--------------+ FV DistalNone                                         Chronic        +---------+---------------+---------+-----------+----------+--------------+ PFV  Full                                                        +---------+---------------+---------+-----------+----------+--------------+ POP      Partial                                      Chronic        +---------+---------------+---------+-----------+----------+--------------+ PTV      Full                                                        +---------+---------------+---------+-----------+----------+--------------+     Summary: RIGHT: - Findings consistent with chronic deep vein thrombosis involving the right femoral vein, and right popliteal vein.  - No cystic structure found in the popliteal fossa.   *See table(s) above for measurements and observations. Electronically signed by Norman Serve on 12/06/2023 at 10:07:40 AM.    Final    CT Lumbar Spine Wo Contrast Result Date: 12/05/2023 CLINICAL DATA:  Low back pain, increased fracture risk Myelopathy, acute, lumbar spine EXAM: CT LUMBAR SPINE WITHOUT CONTRAST TECHNIQUE: Multidetector CT imaging of the lumbar spine was performed without  intravenous contrast administration. Multiplanar CT image reconstructions were also generated. RADIATION DOSE REDUCTION: This exam was performed according to the departmental dose-optimization program which includes automated exposure control, adjustment of the mA and/or kV according to patient size and/or use of iterative reconstruction technique. COMPARISON:  None Available. FINDINGS: Segmentation: Normal. 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 Vertebrae: No fractures or osseous lesions. Paraspinal and other soft tissues: There is a fusiform infrarenal abdominal aorta aneurysm present, which measures up to of 4.3 x 4.2 cm in cross-sectional diameter. 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 The paraspinous soft tissues are otherwise unremarkable. Disc levels: L1-2: Normal. L2-3: Normal. L3-4: Broad-based disc bulging and bilateral facet hypertrophy, resulting in mild-to-moderate central spinal canal stenosis and bilateral lateral recess stenosis. L4-5: Broad-based disc bulging and moderate bilateral facet hypertrophy, causing moderate central spinal canal stenosis and moderate bilateral lateral recess stenosis. L5-S1: Disc space narrowing. Moderate bilateral facet arthrosis. No significant spinal canal or neural foraminal stenosis. IMPRESSION: 1. 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 2. Chronic degenerative disc disease at L3-4 with mild-to-moderate central spinal canal and bilateral lateral recess stenosis. 3. 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 Electronically Signed   By: Evalene Coho M.D.   On: 12/05/2023 12:33      Discharge Exam: Vitals:   12/15/23 0401 12/15/23 0806  BP: 124/62 (!) 123/45  Pulse: (!) 58 63  Resp: 20 20  Temp:  98.3 F (36.8 C) 98 F (36.7 C)  SpO2: 99% 98%    General: Pt is alert, awake, not in acute distress Cardiovascular: RR, S1/S2 +, no edema Respiratory: CTA bilaterally, no wheezing, no rhonchi, no respiratory distress, no conversational dyspnea  Abdominal: Soft, NT, ND, bowel sounds +  Extremities: no edema, no cyanosis Psych: Normal mood and affect, stable judgement and insight     The results of significant diagnostics from this hospitalization (including imaging, microbiology, ancillary and laboratory) are listed below for reference.     Microbiology: No results found for this or any previous visit (from the past 240 hours).   Labs: BNP (last 3 results) No results for input(s): BNP in the last 8760 hours. Basic Metabolic Panel: Recent Labs  Lab 12/10/23 1151 12/10/23 1200 12/10/23 1207 12/11/23 0329 12/13/23 0602  NA 136 136 139 140 137  K 3.9 3.1* 3.2* 3.6 3.5  CL 106 104 104 108 106  CO2  --  26  --  23 24  GLUCOSE 92 87 90 89 83  BUN 49* 39* 39* 28* 16  CREATININE 1.00 0.96 1.00 0.81 0.74  CALCIUM   --  8.7*  --  8.3* 8.6*  MG  --  2.0  --   --  1.8   Liver Function Tests: Recent Labs  Lab 12/10/23 1200 12/11/23 0329  AST 36 29  ALT 17 16  ALKPHOS 40 43  BILITOT 0.8 1.0  PROT 5.1* 5.6*  ALBUMIN 3.4* 3.0*   No results for input(s): LIPASE, AMYLASE in the last 168 hours. No results for input(s): AMMONIA in the last 168 hours. CBC: Recent Labs  Lab 12/10/23 1200 12/10/23 1207 12/11/23 0618 12/12/23 0859 12/13/23 0602 12/14/23 0542 12/15/23 0655  WBC 7.5   < > 7.0 5.5 6.4 5.4 5.7  NEUTROABS 5.1  --  4.9 3.6 4.3  --   --   HGB 5.7*   < > 8.0* 7.5* 7.6* 7.7* 8.0*  HCT 17.5*   < > 23.7* 22.7* 23.0* 23.6* 25.1*  MCV 93.6   < > 91.5 93.4 93.1 95.2 96.9  PLT 78*   < > 80* 100* 111* 123* 133*   < > = values in this interval not displayed.   Cardiac Enzymes: Recent Labs  Lab 12/10/23 1200  CKTOTAL 762*   BNP: Invalid input(s):  POCBNP CBG: No results for input(s): GLUCAP in the last 168 hours. D-Dimer No results for input(s): DDIMER in the last 72 hours. Hgb A1c No results for input(s): HGBA1C in the last 72 hours. Lipid Profile No results for input(s): CHOL, HDL, LDLCALC, TRIG, CHOLHDL, LDLDIRECT in the last 72 hours. Thyroid function studies No results for input(s): TSH, T4TOTAL, T3FREE, THYROIDAB in the last 72 hours.  Invalid input(s): FREET3 Anemia work up No results for input(s): VITAMINB12, FOLATE, FERRITIN, TIBC, IRON, RETICCTPCT in the last 72 hours. Urinalysis    Component Value Date/Time   COLORURINE YELLOW 12/05/2023 1525   APPEARANCEUR CLOUDY (A) 12/05/2023 1525   LABSPEC 1.020 12/05/2023 1525   PHURINE 5.0 12/05/2023 1525   GLUCOSEU NEGATIVE 12/05/2023 1525   HGBUR LARGE (A) 12/05/2023 1525   BILIRUBINUR NEGATIVE 12/05/2023 1525   KETONESUR NEGATIVE 12/05/2023 1525   PROTEINUR >=300 (A) 12/05/2023 1525   UROBILINOGEN 1.0 11/15/2010 2015   NITRITE NEGATIVE 12/05/2023 1525   LEUKOCYTESUR TRACE (A) 12/05/2023 1525   Sepsis Labs Recent Labs  Lab 12/12/23 0859 12/13/23 0602 12/14/23 0542 12/15/23 0655  WBC 5.5 6.4 5.4 5.7   Microbiology No results found for this or any previous visit (from the past 240 hours).   Patient was seen and examined on the day of discharge and was found to be in stable condition. Time coordinating discharge: 40 minutes including assessment and coordination of care, as well as examination of the  patient.   SIGNED:  Delon Hoe, DO [facility] 12/15/2023, 9:55 AM

## 2023-12-15 NOTE — Progress Notes (Signed)
 DC order noted per MD. DC RN at the bedside. Planned transport to SNF. AVS printed and medical necessity in dc packet.

## 2023-12-15 NOTE — TOC Transition Note (Signed)
 Transition of Care [facility]) - Discharge Note   Patient Details  Name: [name] MRN: [id number] Date of Birth: 1945/10/19  Transition of Care [facility]) CM/SW Contact:  [name], LCSW Phone Number: 12/15/2023, 2:03 PM   Clinical Narrative:    Patient will DC to: [facility] Anticipated DC date: 12/15/23 Family notified: Daughter, [name] Transport by: [facility]   Per MD patient ready for DC to [place]. RN to call report prior to discharge [phone] room 908P). RN, patient, patient's family, and facility notified of DC. Discharge Summary and FL2 sent to facility. DC packet on chart. Ambulance transport requested for patient.   CSW will sign off for now as social work intervention is no longer needed. Please consult us  again if new needs arise.     Final next level of care: Skilled Nursing Facility Barriers to Discharge: Barriers Resolved   Patient Goals and CMS Choice Patient states their goals for this hospitalization and ongoing recovery are:: Rehab CMS Medicare.gov Compare Post Acute Care list provided to:: Patient Choice offered to / list presented to : Patient [place] ownership interest in [facility].provided to:: Patient    Discharge Placement   Existing PASRR number confirmed : 12/15/23          Patient chooses bed at: [facility] Patient to be transferred to facility by: [facility] Name of family member notified: Daughter Patient and family notified of of transfer: 12/15/23  Discharge Plan and Services Additional resources added to the After Visit Summary for   In-house Referral: Clinical Social Work                                   Social Drivers of Health (SDOH) Interventions SDOH Screenings   Food Insecurity: No Food Insecurity (12/13/2023)  Housing: Low Risk  (12/13/2023)  Transportation Needs: No Transportation Needs (12/13/2023)  Utilities: Not At Risk (12/13/2023)  Depression (PHQ2-9): Low Risk  (06/04/2021)  Social Connections: Unknown  (12/13/2023)  Tobacco Use: Medium Risk (12/10/2023)     Readmission Risk Interventions     No data to display

## 2023-12-18 ENCOUNTER — Telehealth: Payer: Self-pay | Admitting: Hematology

## 2023-12-18 NOTE — Telephone Encounter (Signed)
 Scheduled appointments per 8/8 staff message. Called and left a VM for the patients son with the appointment details. Called the patient but the VM is not set up.

## 2023-12-25 LAB — SURGICAL PATHOLOGY

## 2023-12-26 ENCOUNTER — Inpatient Hospital Stay: Admitting: Hematology

## 2023-12-26 ENCOUNTER — Inpatient Hospital Stay

## 2023-12-26 ENCOUNTER — Encounter (HOSPITAL_COMMUNITY): Payer: Self-pay

## 2023-12-26 NOTE — Assessment & Plan Note (Deleted)
-  She was admitted on 12/10/2023 for multiple falls at home and lab showed severe anemia with Hg 5.4, and plt 45-80K.  Iron study was normal, B12 level slightly low at 254, folate level was low.  MCV is normal.  Her reticulocyte count was normal, LDH slightly elevated, CMP was unremarkable with normal.  - She underwent a bone marrow biopsy on December 13, 2023, which showed normocellular bone marrow with trilineage, no significant dysplasia, no increased blasts.

## 2024-01-01 NOTE — Assessment & Plan Note (Deleted)
-  She was admitted on 12/10/2023 for multiple falls at home and lab showed severe anemia with Hg 5.4, and plt 45-80K.  Iron study was normal, B12 level slightly low at 254, folate level was low.  MCV is normal.  Her reticulocyte count was normal, LDH slightly elevated, CMP was unremarkable with normal.  - She underwent a bone marrow biopsy on December 13, 2023, which showed normocellular bone marrow with trilineage, no significant dysplasia, no increased blasts.

## 2024-01-02 ENCOUNTER — Inpatient Hospital Stay: Admitting: Hematology

## 2024-01-02 ENCOUNTER — Telehealth: Payer: Self-pay

## 2024-01-02 ENCOUNTER — Inpatient Hospital Stay: Attending: Hematology

## 2024-01-02 DIAGNOSIS — D649 Anemia, unspecified: Secondary | ICD-10-CM

## 2024-01-02 NOTE — Telephone Encounter (Signed)
 Tried calling pt regarding appts today.  Pt did not answer and unable to leave a voicemail. No Showed appts today.

## 2024-01-07 ENCOUNTER — Encounter (HOSPITAL_COMMUNITY): Payer: Self-pay

## 2024-01-07 ENCOUNTER — Other Ambulatory Visit: Payer: Self-pay

## 2024-01-07 ENCOUNTER — Observation Stay (HOSPITAL_COMMUNITY)
Admission: EM | Admit: 2024-01-07 | Discharge: 2024-01-09 | Disposition: A | Discharge status: Home Health | Attending: Internal Medicine | Admitting: Internal Medicine

## 2024-01-07 ENCOUNTER — Emergency Department (HOSPITAL_COMMUNITY)

## 2024-01-07 DIAGNOSIS — Z8673 Personal history of transient ischemic attack (TIA), and cerebral infarction without residual deficits: Secondary | ICD-10-CM | POA: Diagnosis not present

## 2024-01-07 DIAGNOSIS — J129 Viral pneumonia, unspecified: Secondary | ICD-10-CM | POA: Diagnosis not present

## 2024-01-07 DIAGNOSIS — N390 Urinary tract infection, site not specified: Secondary | ICD-10-CM | POA: Diagnosis not present

## 2024-01-07 DIAGNOSIS — J9621 Acute and chronic respiratory failure with hypoxia: Principal | ICD-10-CM

## 2024-01-07 DIAGNOSIS — Z87891 Personal history of nicotine dependence: Secondary | ICD-10-CM | POA: Diagnosis not present

## 2024-01-07 DIAGNOSIS — E785 Hyperlipidemia, unspecified: Secondary | ICD-10-CM | POA: Insufficient documentation

## 2024-01-07 DIAGNOSIS — I10 Essential (primary) hypertension: Secondary | ICD-10-CM | POA: Insufficient documentation

## 2024-01-07 DIAGNOSIS — Z79899 Other long term (current) drug therapy: Secondary | ICD-10-CM | POA: Diagnosis not present

## 2024-01-07 DIAGNOSIS — B9620 Unspecified Escherichia coli [E. coli] as the cause of diseases classified elsewhere: Secondary | ICD-10-CM | POA: Insufficient documentation

## 2024-01-07 DIAGNOSIS — L8990 Pressure ulcer of unspecified site, unspecified stage: Secondary | ICD-10-CM | POA: Insufficient documentation

## 2024-01-07 DIAGNOSIS — J449 Chronic obstructive pulmonary disease, unspecified: Secondary | ICD-10-CM | POA: Insufficient documentation

## 2024-01-07 DIAGNOSIS — E860 Dehydration: Secondary | ICD-10-CM | POA: Insufficient documentation

## 2024-01-07 DIAGNOSIS — E876 Hypokalemia: Secondary | ICD-10-CM | POA: Diagnosis not present

## 2024-01-07 DIAGNOSIS — J9601 Acute respiratory failure with hypoxia: Principal | ICD-10-CM | POA: Insufficient documentation

## 2024-01-07 DIAGNOSIS — R0602 Shortness of breath: Secondary | ICD-10-CM | POA: Diagnosis present

## 2024-01-07 LAB — COMPREHENSIVE METABOLIC PANEL WITH GFR
ALT: 11 U/L (ref 0–44)
AST: 20 U/L (ref 15–41)
Albumin: 3.2 g/dL — ABNORMAL LOW (ref 3.5–5.0)
Alkaline Phosphatase: 58 U/L (ref 38–126)
Anion gap: 9 (ref 5–15)
BUN: 18 mg/dL (ref 8–23)
CO2: 21 mmol/L — ABNORMAL LOW (ref 22–32)
Calcium: 8.5 mg/dL — ABNORMAL LOW (ref 8.9–10.3)
Chloride: 110 mmol/L (ref 98–111)
Creatinine, Ser: 1.07 mg/dL — ABNORMAL HIGH (ref 0.44–1.00)
GFR, Estimated: 53 mL/min — ABNORMAL LOW (ref >60)
Glucose, Bld: 120 mg/dL — ABNORMAL HIGH (ref 70–99)
Potassium: 2.9 mmol/L — ABNORMAL LOW (ref 3.5–5.1)
Sodium: 140 mmol/L (ref 135–145)
Total Bilirubin: 0.6 mg/dL (ref 0.0–1.2)
Total Protein: 6.7 g/dL (ref 6.5–8.1)

## 2024-01-07 LAB — I-STAT CHEM 8, ED
BUN: 20 mg/dL (ref 8–23)
Calcium, Ion: 1.13 mmol/L — ABNORMAL LOW (ref 1.15–1.40)
Chloride: 110 mmol/L (ref 98–111)
Creatinine, Ser: 1 mg/dL (ref 0.44–1.00)
Glucose, Bld: 119 mg/dL — ABNORMAL HIGH (ref 70–99)
HCT: 34 % — ABNORMAL LOW (ref 36.0–46.0)
Hemoglobin: 11.6 g/dL — ABNORMAL LOW (ref 12.0–15.0)
Potassium: 2.8 mmol/L — ABNORMAL LOW (ref 3.5–5.1)
Sodium: 144 mmol/L (ref 135–145)
TCO2: 21 mmol/L — ABNORMAL LOW (ref 22–32)

## 2024-01-07 LAB — I-STAT CG4 LACTIC ACID, ED: Lactic Acid, Venous: 1.5 mmol/L (ref 0.5–1.9)

## 2024-01-07 LAB — CBC WITH DIFFERENTIAL/PLATELET
Abs Immature Granulocytes: 0.01 K/uL (ref 0.00–0.07)
Basophils Absolute: 0 K/uL (ref 0.0–0.1)
Basophils Relative: 0 %
Eosinophils Absolute: 0 K/uL (ref 0.0–0.5)
Eosinophils Relative: 1 %
HCT: 32.1 % — ABNORMAL LOW (ref 36.0–46.0)
Hemoglobin: 10.1 g/dL — ABNORMAL LOW (ref 12.0–15.0)
Immature Granulocytes: 0 %
Lymphocytes Relative: 23 %
Lymphs Abs: 0.8 K/uL (ref 0.7–4.0)
MCH: 31.3 pg (ref 26.0–34.0)
MCHC: 31.5 g/dL (ref 30.0–36.0)
MCV: 99.4 fL (ref 80.0–100.0)
Monocytes Absolute: 0 K/uL — ABNORMAL LOW (ref 0.1–1.0)
Monocytes Relative: 1 %
Neutro Abs: 2.6 K/uL (ref 1.7–7.7)
Neutrophils Relative %: 75 %
Platelets: 127 K/uL — ABNORMAL LOW (ref 150–400)
RBC: 3.23 MIL/uL — ABNORMAL LOW (ref 3.87–5.11)
RDW: 14.4 % (ref 11.5–15.5)
WBC: 3.4 K/uL — ABNORMAL LOW (ref 4.0–10.5)
nRBC: 0 % (ref 0.0–0.2)

## 2024-01-07 LAB — RESP PANEL BY RT-PCR (RSV, FLU A&B, COVID)  RVPGX2
Influenza A by PCR: NEGATIVE
Influenza B by PCR: NEGATIVE
Resp Syncytial Virus by PCR: NEGATIVE
SARS Coronavirus 2 by RT PCR: NEGATIVE

## 2024-01-07 MED ORDER — FAMOTIDINE IN NACL 20-0.9 MG/50ML-% IV SOLN
20.0000 mg | Freq: Once | INTRAVENOUS | Status: AC
  Administered 2024-01-07: 20 mg via INTRAVENOUS
  Filled 2024-01-07: qty 50

## 2024-01-07 MED ORDER — SODIUM CHLORIDE 0.9 % IV SOLN: INTRAVENOUS | Status: DC

## 2024-01-07 MED ORDER — IOHEXOL 350 MG/ML SOLN
75.0000 mL | Freq: Once | INTRAVENOUS | Status: AC | PRN
  Administered 2024-01-07: 75 mL via INTRAVENOUS

## 2024-01-07 MED ORDER — SODIUM CHLORIDE 0.9 % IV BOLUS
500.0000 mL | Freq: Once | INTRAVENOUS | Status: AC
  Administered 2024-01-07: 500 mL via INTRAVENOUS

## 2024-01-07 MED ORDER — DIPHENHYDRAMINE HCL 50 MG/ML IJ SOLN
50.0000 mg | Freq: Once | INTRAMUSCULAR | Status: AC
  Administered 2024-01-07: 50 mg via INTRAVENOUS
  Filled 2024-01-07: qty 1

## 2024-01-07 MED ORDER — POTASSIUM CHLORIDE CRYS ER 20 MEQ PO TBCR
60.0000 meq | EXTENDED_RELEASE_TABLET | Freq: Once | ORAL | Status: AC
  Administered 2024-01-07: 60 meq via ORAL
  Filled 2024-01-07: qty 3

## 2024-01-07 MED ORDER — DEXAMETHASONE SODIUM PHOSPHATE 10 MG/ML IJ SOLN
10.0000 mg | Freq: Once | INTRAMUSCULAR | Status: AC
  Administered 2024-01-07: 10 mg via INTRAVENOUS
  Filled 2024-01-07: qty 1

## 2024-01-07 NOTE — ED Provider Notes (Signed)
 Patient signed out to me by Dr. Dasie.  Patient awaiting CT angiography to further evaluate her hypoxia.   CT has been performed, no evidence of PE.  No pneumonia.  Upon returning from radiology, however, patient began itching.  No angioedema or wheezing.  Patient scratching her extremities but no clear hives.  Must be concerned about possible IV dye allergy.  Treated with Decadron , IV Benadryl , IV Solu-Medrol .  Will need hospital admission.       Haze Lonni PARAS, MD 01/07/24 [phone]

## 2024-01-07 NOTE — ED Provider Notes (Signed)
 [place] EMERGENCY DEPARTMENT AT [facility] Provider Note   CSN: [id number] Arrival date & time: 01/07/24  2045     Patient presents with: No chief complaint on file.   [name] is a 78 y.o. female.   78 year old female presents with 1 day of cough and shortness of breath.  No vomiting or diarrhea.  Patient states that she has had bodyaches and some chills.  Also some foul-smelling urine.  Patient normally does not use oxygen.  Does note some pain in her lungs.  EMS was called and patient was congested.  Tympanic temperature was 98.2.  CBG of 114.  Does have a history of COPD.  Placed on 6 L oxygen and transported here       Prior to Admission medications   Medication Sig Start Date End Date Taking? Authorizing Provider  amLODipine  (NORVASC ) 10 MG tablet Take 1 tablet (10 mg total) by mouth daily. 12/15/23   Rojelio Nest, DO  hydrALAZINE  (APRESOLINE ) 50 MG tablet Take 1 tablet (50 mg total) by mouth every 8 (eight) hours. 12/15/23   Rojelio Nest, DO  pantoprazole  (PROTONIX ) 40 MG tablet Take 1 tablet (40 mg total) by mouth daily. 12/15/23   Rojelio Nest, DO  polyethylene glycol (MIRALAX  / GLYCOLAX ) 17 g packet Take 17 g by mouth daily. 12/15/23   Rojelio Nest, DO  rosuvastatin  (CRESTOR ) 20 MG tablet TAKE 1 TABLET(20 MG) BY MOUTH DAILY Patient taking differently: Take 20 mg by mouth daily. 08/23/23   Lorren Greig PARAS, NP    Allergies: Patient has no known allergies.    Review of Systems  All other systems reviewed and are negative.   Updated Vital Signs BP (!) 86/75   Pulse 69   Temp 97.8 F (36.6 C) (Rectal)   Resp (!) 24   SpO2 100%   Physical Exam Vitals and nursing note reviewed.  Constitutional:      General: She is not in acute distress.    Appearance: Normal appearance. She is well-developed. She is not toxic-appearing.  HENT:     Head: Normocephalic and atraumatic.  Eyes:     General: Lids are normal.     Conjunctiva/sclera: Conjunctivae  normal.     Pupils: Pupils are equal, round, and reactive to light.  Neck:     Thyroid: No thyroid mass.     Trachea: No tracheal deviation.  Cardiovascular:     Rate and Rhythm: Normal rate and regular rhythm.     Heart sounds: Normal heart sounds. No murmur heard.    No gallop.  Pulmonary:     Effort: Tachypnea and respiratory distress present.     Breath sounds: No stridor. Examination of the right-upper field reveals decreased breath sounds. Examination of the left-upper field reveals decreased breath sounds. Decreased breath sounds present. No wheezing, rhonchi or rales.  Abdominal:     General: There is no distension.     Palpations: Abdomen is soft.     Tenderness: There is no abdominal tenderness. There is no rebound.  Musculoskeletal:        General: No tenderness. Normal range of motion.     Cervical back: Normal range of motion and neck supple.  Skin:    General: Skin is warm and dry.     Findings: No abrasion or rash.  Neurological:     Mental Status: She is alert and oriented to person, place, and time. Mental status is at baseline.     GCS: GCS eye subscore  is 4. GCS verbal subscore is 5. GCS motor subscore is 6.     Cranial Nerves: No cranial nerve deficit.     Sensory: No sensory deficit.     Motor: Motor function is intact.  Psychiatric:        Attention and Perception: Attention normal.        Speech: Speech normal.        Behavior: Behavior normal.     (all labs ordered are listed, but only abnormal results are displayed) Labs Reviewed  RESP PANEL BY RT-PCR (RSV, FLU A&B, COVID)  RVPGX2  CULTURE, BLOOD (ROUTINE X 2)  CULTURE, BLOOD (ROUTINE X 2)  CBC WITH DIFFERENTIAL/PLATELET  COMPREHENSIVE METABOLIC PANEL WITH GFR  URINALYSIS, W/ REFLEX TO CULTURE (INFECTION SUSPECTED)  I-STAT CHEM 8, ED  I-STAT CG4 LACTIC ACID, ED    EKG: EKG Interpretation Date/Time:  Sunday January 07 2024 21:14:11 EDT Ventricular Rate:  72 PR Interval:  155 QRS  Duration:  111 QT Interval:  422 QTC Calculation: 462 R Axis:   47  Text Interpretation: Sinus rhythm Probable LVH with secondary repol abnrm No significant change since last tracing Confirmed by Dasie Faden ([id number]) on 01/07/2024 9:37:10 PM  Radiology: No results found.   Procedures   Medications Ordered in the ED  0.9 %  sodium chloride  infusion (has no administration in time range)  sodium chloride  0.9 % bolus 500 mL (has no administration in time range)                                    Medical Decision Making Amount and/or Complexity of Data Reviewed Labs: ordered. Radiology: ordered.  Risk Prescription drug management.   Patient is EKG shows sinus rhythm.  Chest x-ray without acute findings here.  Concern for possible PE and CT angio chest is pending at this time.  She does require oxygen at this time.  Hyperkalemic and treated with oral potassium.  Signed over to Dr. Wanita     Final diagnoses:  None    ED Discharge Orders     None          Dasie Faden, MD 01/07/24 2316

## 2024-01-07 NOTE — ED Triage Notes (Signed)
 Pt coming from home for breathing difficulty, reports lung pain. Per EMS lung sounds congested. Hx of new stroke. Hx of copd.  EMS VS 99.32F tympanic. 103/69BP 78HR CBG 114

## 2024-01-08 DIAGNOSIS — E860 Dehydration: Secondary | ICD-10-CM | POA: Insufficient documentation

## 2024-01-08 DIAGNOSIS — J9601 Acute respiratory failure with hypoxia: Secondary | ICD-10-CM | POA: Diagnosis present

## 2024-01-08 DIAGNOSIS — J129 Viral pneumonia, unspecified: Secondary | ICD-10-CM | POA: Insufficient documentation

## 2024-01-08 LAB — URINALYSIS, W/ REFLEX TO CULTURE (INFECTION SUSPECTED)
Bilirubin Urine: NEGATIVE
Glucose, UA: NEGATIVE mg/dL
Ketones, ur: NEGATIVE mg/dL
Nitrite: POSITIVE — AB
Protein, ur: >=300 mg/dL — AB
Specific Gravity, Urine: >1.046 — ABNORMAL HIGH (ref 1.005–1.030)
pH: 5 (ref 5.0–8.0)

## 2024-01-08 LAB — RESPIRATORY PANEL BY PCR

## 2024-01-08 LAB — BASIC METABOLIC PANEL WITH GFR
Anion gap: 11 (ref 5–15)
BUN: 19 mg/dL (ref 8–23)
CO2: 20 mmol/L — ABNORMAL LOW (ref 22–32)
Calcium: 8.3 mg/dL — ABNORMAL LOW (ref 8.9–10.3)
Chloride: 109 mmol/L (ref 98–111)
Creatinine, Ser: 0.93 mg/dL (ref 0.44–1.00)
GFR, Estimated: >60 mL/min (ref >60)
Glucose, Bld: 129 mg/dL — ABNORMAL HIGH (ref 70–99)
Potassium: 4.1 mmol/L (ref 3.5–5.1)
Sodium: 140 mmol/L (ref 135–145)

## 2024-01-08 LAB — BLOOD CULTURE ID PANEL (REFLEXED) - BCID2

## 2024-01-08 LAB — TSH: TSH: 1.591 u[IU]/mL (ref 0.350–4.500)

## 2024-01-08 LAB — CBC
HCT: 32.2 % — ABNORMAL LOW (ref 36.0–46.0)
Hemoglobin: 10.3 g/dL — ABNORMAL LOW (ref 12.0–15.0)
MCH: 31.1 pg (ref 26.0–34.0)
MCHC: 32 g/dL (ref 30.0–36.0)
MCV: 97.3 fL (ref 80.0–100.0)
Platelets: 115 K/uL — ABNORMAL LOW (ref 150–400)
RBC: 3.31 MIL/uL — ABNORMAL LOW (ref 3.87–5.11)
RDW: 14.6 % (ref 11.5–15.5)
WBC: 7.3 K/uL (ref 4.0–10.5)
nRBC: 0 % (ref 0.0–0.2)

## 2024-01-08 LAB — MAGNESIUM: Magnesium: 1.7 mg/dL (ref 1.7–2.4)

## 2024-01-08 LAB — PROCALCITONIN: Procalcitonin: 1.75 ng/mL

## 2024-01-08 LAB — PHOSPHORUS: Phosphorus: 3.8 mg/dL (ref 2.5–4.6)

## 2024-01-08 LAB — TROPONIN I (HIGH SENSITIVITY): Troponin I (High Sensitivity): 22 ng/L — ABNORMAL HIGH (ref <18)

## 2024-01-08 MED ORDER — ALBUTEROL SULFATE (2.5 MG/3ML) 0.083% IN NEBU: 2.5000 mg | INHALATION_SOLUTION | RESPIRATORY_TRACT | Status: DC | PRN

## 2024-01-08 MED ORDER — IPRATROPIUM-ALBUTEROL 0.5-2.5 (3) MG/3ML IN SOLN
3.0000 mL | Freq: Four times a day (QID) | RESPIRATORY_TRACT | Status: DC
  Administered 2024-01-08 (×3): 3 mL via RESPIRATORY_TRACT
  Filled 2024-01-08 (×3): qty 3

## 2024-01-08 MED ORDER — GERHARDT'S BUTT CREAM
TOPICAL_CREAM | Freq: Two times a day (BID) | CUTANEOUS | Status: DC
  Filled 2024-01-08 (×2): qty 60

## 2024-01-08 MED ORDER — POLYETHYLENE GLYCOL 3350 17 G PO PACK: 17.0000 g | PACK | Freq: Every day | ORAL | Status: DC | PRN

## 2024-01-08 MED ORDER — POTASSIUM CHLORIDE 10 MEQ/100ML IV SOLN
10.0000 meq | INTRAVENOUS | Status: AC
  Administered 2024-01-08 (×2): 10 meq via INTRAVENOUS
  Filled 2024-01-08 (×2): qty 100

## 2024-01-08 MED ORDER — LACTATED RINGERS IV BOLUS: 1000.0000 mL | Freq: Once | INTRAVENOUS | Status: DC

## 2024-01-08 MED ORDER — ACETAMINOPHEN 500 MG PO TABS
1000.0000 mg | ORAL_TABLET | Freq: Four times a day (QID) | ORAL | Status: DC | PRN
  Administered 2024-01-08 – 2024-01-09 (×2): 1000 mg via ORAL
  Filled 2024-01-08 (×2): qty 2

## 2024-01-08 MED ORDER — PANTOPRAZOLE SODIUM 40 MG PO TBEC
40.0000 mg | DELAYED_RELEASE_TABLET | Freq: Every day | ORAL | Status: DC
  Administered 2024-01-08: 40 mg via ORAL
  Filled 2024-01-08 (×2): qty 1

## 2024-01-08 MED ORDER — GERHARDT'S BUTT CREAM: TOPICAL_CREAM | CUTANEOUS | Status: DC | PRN

## 2024-01-08 MED ORDER — LACTATED RINGERS IV SOLN: INTRAVENOUS | Status: DC

## 2024-01-08 MED ORDER — SODIUM CHLORIDE 0.9% FLUSH
3.0000 mL | Freq: Two times a day (BID) | INTRAVENOUS | Status: DC
  Administered 2024-01-08 – 2024-01-09 (×2): 3 mL via INTRAVENOUS

## 2024-01-08 MED ORDER — LACTATED RINGERS IV SOLN: INTRAVENOUS | Status: AC

## 2024-01-08 MED ORDER — LACTATED RINGERS IV BOLUS
500.0000 mL | Freq: Once | INTRAVENOUS | Status: AC
  Administered 2024-01-08: 500 mL via INTRAVENOUS

## 2024-01-08 MED ORDER — ONDANSETRON HCL 4 MG/2ML IJ SOLN: 4.0000 mg | Freq: Four times a day (QID) | INTRAMUSCULAR | Status: DC | PRN

## 2024-01-08 MED ORDER — MELATONIN 3 MG PO TABS
6.0000 mg | ORAL_TABLET | Freq: Every evening | ORAL | Status: DC | PRN
  Administered 2024-01-08: 6 mg via ORAL
  Filled 2024-01-08: qty 2

## 2024-01-08 MED ORDER — ROSUVASTATIN CALCIUM 20 MG PO TABS
20.0000 mg | ORAL_TABLET | Freq: Every day | ORAL | Status: DC
  Administered 2024-01-08: 20 mg via ORAL
  Filled 2024-01-08 (×2): qty 1

## 2024-01-08 NOTE — Evaluation (Signed)
 Occupational Therapy Evaluation Patient Details Name: [name] MRN: [id number] DOB: 1945-11-04 Today's Date: 01/08/2024   History of Present Illness   [name] is a 78 y.o. female who presents with shortness of breath.  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 Pt states her daughter assists with ADL tasks. At baseline [name] uses a RW for mobility however had significant difficulty ambulating this date and was only abe to take a couple of steps to the recliner. Overall Max A with ADL tasks. Attempted to call family for PLOF and availability to assist at DC however no answer. Regarding sleeping on a mattress on the floor, [name] said she fell when trying to get out of the chair and was unable to stand up therefore crawled to a mattress which was on the floor. At this time Patient will benefit from continued inpatient follow up therapy, <3 hours/day. Acute OT to follow.   VSS on RA with SpO2 95 after activity.      If plan is discharge home, recommend the following:   A lot of help with walking and/or transfers;A lot of help with bathing/dressing/bathroom     Functional Status Assessment   Patient has had a recent decline in their functional status and demonstrates the ability to make significant improvements in function in a reasonable and predictable amount of time.     Equipment Recommendations   None recommended by OT     Recommendations for Other Services         Precautions/Restrictions   Precautions Precautions: Fall Recall of Precautions/Restrictions: Impaired Restrictions Weight Bearing Restrictions Per Provider Order:  No     Mobility Bed Mobility Overal bed mobility: Needs Assistance Bed Mobility: Supine to Sit, Sit to Supine     Supine to sit: HOB elevated, Used rails, Mod assist          Transfers Overall transfer level: Needs assistance Equipment used: Rolling walker (2 wheels) Transfers: Sit to/from Stand, Bed to chair/wheelchair/BSC Sit to Stand: Mod assist, From elevated surface     Step pivot transfers: Mod assist, From elevated surface            Balance Overall balance assessment: Needs assistance Sitting-balance support: Single extremity supported, Feet supported Sitting balance-Leahy Scale: Fair       Standing balance-Leahy Scale: Poor                             ADL either performed or assessed with clinical judgement   ADL Overall ADL's : Needs assistance/impaired Eating/Feeding: Set up   Grooming: Set up;Sitting   Upper Body Bathing: Minimal assistance;Sitting   Lower Body Bathing: Moderate assistance;Sit to/from stand   Upper Body Dressing : Moderate assistance;Sitting   Lower Body Dressing: Maximal assistance;Sit to/from stand   Toilet Transfer: Moderate assistance;Stand-pivot   Toileting- Clothing Manipulation and Hygiene: Maximal assistance       Functional mobility during ADLs: Moderate assistance;Rolling walker (2 wheels)       Vision         Perception         Praxis  Pertinent Vitals/Pain Pain Assessment Pain Assessment: No/denies pain Faces Pain Scale: No hurt     Extremity/Trunk Assessment Upper Extremity Assessment Upper Extremity Assessment: Right hand dominant;LUE deficits/detail LUE Deficits / Details: residual hemiparesis; uses as a funcitonal assist LUE Coordination: decreased fine motor   Lower Extremity Assessment Lower Extremity Assessment: Defer to PT evaluation   Cervical / Trunk Assessment Cervical / Trunk Assessment: Normal   Communication Communication Communication:  Impaired Factors Affecting Communication: Reduced clarity of speech   Cognition Arousal: Alert Behavior During Therapy: Flat affect Cognition: No family/caregiver present to determine baseline                               Following commands: Impaired Following commands impaired: Follows one step commands inconsistently, Follows one step commands with increased time     Cueing  General Comments   Cueing Techniques: Verbal cues;Gestural cues;Tactile cues;Visual cues      Exercises     Shoulder Instructions      Home Living Family/patient expects to be discharged to:: Private residence Living Arrangements: Children;Other relatives Available Help at Discharge: Available 24 hours/day Type of Home: Apartment Home Access: Level entry     Home Layout: Two level;Able to live on main level with bedroom/bathroom Alternate Level Stairs-Number of Steps: flight   Bathroom Shower/Tub: Producer, television/film/video: Standard Bathroom Accessibility: Yes How Accessible: Accessible via walker Home Equipment: Rolling Walker (2 wheels);BSC/3in1;Shower seat;Wheelchair - manual   Additional Comments: per patient she lives with her daughter and grandson, however no daughter is listed as a contact only a son. Attempted to reach son however unsuccessful. Pt reports having a tub shower with a seat, reports that she plans on throwing her bed out and buying a new one but doesn't know what kind, didn't state if she had stairs      Prior Functioning/Environment Prior Level of Function : Needs assist             Mobility Comments: pt reports using a RW and transferring self in/out of bed and walking around home but not leaving house; states her grandson assists her with mobility and lifts her up ADLs Comments: reports daughter prepares meals, assists with dressing and bathing and drives    OT Problem List: Decreased strength;Decreased range of motion;Decreased activity  tolerance;Impaired balance (sitting and/or standing);Decreased coordination;Decreased safety awareness;Decreased knowledge of use of DME or AE;Obesity   OT Treatment/Interventions: Self-care/ADL training;Therapeutic exercise;Neuromuscular education;DME and/or AE instruction;Therapeutic activities;Cognitive remediation/compensation;Patient/family education;Balance training      OT Goals(Current goals can be found in the care plan section)   Acute Rehab OT Goals Patient Stated Goal: go home OT Goal Formulation: Patient unable to participate in goal setting Time For Goal Achievement: 01/22/24 Potential to Achieve Goals: Good   OT Frequency:  Min 2X/week    Co-evaluation              AM-PAC OT 6 Clicks Daily Activity     Outcome Measure Help from another person eating meals?: A Little Help from another person taking care of personal grooming?: A Little Help from another person toileting, which includes using toliet, bedpan, or urinal?: A Lot Help from another person bathing (including washing, rinsing, drying)?: A Lot Help from another person to put on and taking off regular upper body clothing?: A Lot Help from another person to put on and taking off regular lower body clothing?: A Lot 6 Click Score: 14  End of Session Equipment Utilized During Treatment: Gait belt;Rolling walker (2 wheels) Nurse Communication: Mobility status;Precautions  Activity Tolerance: Patient tolerated treatment well Patient left: in chair;with call bell/phone within reach;with chair alarm set  OT Visit Diagnosis: Unsteadiness on feet (R26.81);Other abnormalities of gait and mobility (R26.89);Muscle weakness (generalized) (M62.81);History of falling (Z91.81);Other symptoms and signs involving cognitive function;Hemiplegia and hemiparesis Hemiplegia - Right/Left: Left Hemiplegia - dominant/non-dominant: Non-Dominant 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

## 2024-01-08 NOTE — ED Notes (Signed)
 Per EMS pt lives with daughter, and sleeps on a mattress on the floor, concerns for patient care and family work load. Possible review for SW/CM for more care.

## 2024-01-08 NOTE — Evaluation (Signed)
 Clinical/Bedside Swallow Evaluation Patient Details  Name: [name] MRN: [id number] Date of Birth: 09/27/1945  Today's Date: 01/08/2024 Time: SLP Start Time (ACUTE ONLY): 0845 SLP Stop Time (ACUTE ONLY): 0900 SLP Time Calculation (min) (ACUTE ONLY): 15 min  Past Medical History:  Past Medical History:  Diagnosis Date   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 (HCC) 09/16/2020   COPD (chronic obstructive pulmonary disease) (HCC)    History of DVT (deep vein thrombosis)    Hypertension    Thoracoabdominal aortic aneurysm (HCC)    TIA (transient ischemic attack) 09/10/2020   Past Surgical History:  Past Surgical History:  Procedure Laterality Date   IR BONE MARROW BIOPSY & ASPIRATION  12/13/2023   IR CT HEAD LTD  09/11/2020   IR INTRAVSC STENT CERV CAROTID W/O EMB-PROT MOD SED INC ANGIO  09/11/2020   IR PERCUTANEOUS ART THROMBECTOMY/INFUSION INTRACRANIAL INC DIAG ANGIO  09/11/2020       IR PERCUTANEOUS ART THROMBECTOMY/INFUSION INTRACRANIAL INC DIAG ANGIO  09/11/2020   IR US  GUIDE VASC ACCESS RIGHT  09/11/2020   NO PAST SURGERIES     RADIOLOGY WITH ANESTHESIA N/A 09/11/2020   Procedure: IR WITH ANESTHESIA;  Surgeon: Dolphus Carrion, MD;  Location: [facility] OR;  Service: Radiology;  Laterality: N/A;   HPI:  [name] is a 78 y.o. female with who presented to ED on 8/31 with SOB x1 day and non-productive cough. Pt hx of CVA in '22 requiring thrombectomy and subsequent SAH / IVH, HTN, HLD, COPD, DVT during pregnancy not on AC; Chronic R femoral DVT persists, aortic aneurysm, lung nodule, recent hx of recurrent ground level falls, and severe symptomatic anemia. CT angio chest, 8/31, 1. No pulmonary embolism.  2. Moderate multi-vessel coronary artery calcification.  3. 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 Recommend annual imaging followup by  CTA or MRA. This recommendation follows 2010   ACCF/AHA/AATS/ACR/ASA/SCA/SCAI/SIR/STS/SVM Guidelines for the  Diagnosis and Management of Patients with Thoracic Aortic Disease.  Circulation. 2010; 121: Z733-z630. Aortic aneurysm NOS (ICD10-I71.9)  4. 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  5. Interval development of bronchial wall thickening in keeping with  airway inflammation, most evident within the central airways of the  mid and lower lung zones.  6. Bronchomalacia involving the bronchus intermedius.  7. Mild emphysema.  8. Moderate hiatal hernia.    Assessment / Plan / Recommendation  Aspiration Risk  Mild aspiration risk;Risk for inadequate nutrition/hydration    Diet Recommendation Dysphagia 3 (Mech soft);Thin liquid    Liquid Administration via: Spoon;Cup;Straw Medication Administration: Crushed with puree Supervision: Full supervision/cueing for compensatory strategies;Staff to assist with self feeding Compensations: Minimize environmental distractions;Slow rate;Small sips/bites;Follow solids with liquid Postural Changes: Seated upright at 90 degrees;Remain upright for at least 30 minutes after po intake    Other  Recommendations Oral Care Recommendations: Oral care QID;Staff/trained caregiver to provide oral care        Functional Status Assessment Patient has had a recent decline in their functional status and demonstrates the ability to make significant improvements in function in a reasonable and predictable amount of time.  Frequency and Duration min 2x/week  2 weeks       Prognosis Prognosis for improved oropharyngeal function: Good Barriers to Reach Goals:  (lethargy)      Swallow Study   General Date of Onset: 01/07/24 HPI: [name] is  a 78 y.o. female with who presented to ED on 8/31 with SOB x1 day and non-productive cough. 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 CT angio chest, 8/31, 1. No pulmonary embolism.  2. Moderate multi-vessel coronary artery calcification.  3. 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 Recommend annual imaging followup by  CTA or MRA. This recommendation follows 2010  ACCF/AHA/AATS/ACR/ASA/SCA/SCAI/SIR/STS/SVM Guidelines for the  Diagnosis and Management of Patients with Thoracic Aortic Disease.  Circulation. 2010; 121: Z733-z630. Aortic aneurysm NOS (ICD10-I71.9)  4. 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  5. Interval development of bronchial wall thickening in keeping with  airway inflammation, most evident within the central airways of the  mid and lower lung zones.  6. Bronchomalacia involving the bronchus intermedius.  7. Mild emphysema.  8. Moderate hiatal hernia. Type of Study: Bedside Swallow Evaluation Previous Swallow Assessment: May 2022 recommended regular, thin Diet Prior to this Study: NPO Temperature Spikes Noted: No Respiratory Status: Nasal cannula (2L/min) History of Recent Intubation: No Behavior/Cognition: Lethargic/Drowsy Oral Cavity Assessment: Within Functional Limits Oral Care Completed by SLP: Yes Oral Cavity - Dentition: Missing dentition Vision: Functional for self-feeding Self-Feeding Abilities: Total assist Patient Positioning: Upright in bed Baseline Vocal Quality: Normal Volitional Cough: Cognitively unable to elicit Volitional Swallow: Unable to elicit    Oral/Motor/Sensory Function     Ice Chips Ice chips: Within functional limits   Thin Liquid Thin Liquid: Impaired Oral Phase Impairments: Reduced labial seal;Poor awareness of bolus Oral Phase Functional Implications:  (anterior loss and initial difficulty  siphoning from straw)    Nectar Thick Nectar Thick Liquid: Not tested   Honey Thick Honey Thick Liquid: Not tested   Puree Puree: Within functional limits Presentation: Spoon   Solid     Solid: Impaired Oral Phase Impairments: Impaired mastication Oral Phase Functional Implications: Impaired mastication;Oral residue Pharyngeal Phase Impairments:  (WFL)     [name], M.S., CCC-SLP Speech-Language Pathologist Secure Chat Preferred  O: [phone]  [name] [name] 01/08/2024,9:19 AM

## 2024-01-08 NOTE — Progress Notes (Signed)
 Courtesy visit- No billing:  Patient is seen and examined today morning.  Patient is admitted for evaluation of acute hypoxic respiratory failure suspicion for viral respiratory infection.  She denies any history of COPD, not on home oxygen.  She is lying  in bed, on 2L supplemental oxygen. Shortness of breath improved. Has dry cough.   Plan to continue supplemental oxygen, wean as tolerated, check sputum cultures.  Continue nebs.  Monitor electrolytes and replete accordingly.  Potassium did improve with supplementation.  Anticipated discharge tomorrow with possible home oxygen if she qualifies.

## 2024-01-08 NOTE — Consult Note (Signed)
 WOC Nurse Consult Note: Reason for Consult: pressure injuries B buttocks/coccyx  Wound type: 1. Stage 2 pressure injuries B buttocks and coccyx  2. Intertriginous dermatitis B inner thighs/perineum  ICD-10 CM Codes for Irritant Dermatitis L24A0 - Due to friction or contact with body fluids; unspecified  L30.4  - Erythema intertrigo. Also used for abrasion of the hand, chafing of the skin, dermatitis due to sweating and friction, friction dermatitis, friction eczema, and genital/thigh intertrigo.  Pressure Injury POA: Yes buttocks/coccyx  Measurement: see nursing flowsheet  Wound bed: pink dry  Drainage (amount, consistency, odor) see nursing flowsheet  Periwound:   Dressing procedure/placement/frequency:  Cleanse buttocks/coccyx wounds with soap and water, dry and apply Xeroform gauze ([place] [phone]) to wound beds daily.  Secure with silicone foam  Cleanse inner thighs/perineum/intact skin of buttocks with soap and water, dry and apply Gerhardt's Butt Cream 2 times daily and prn soiling.  POC discussed with bedside nurse. WOC team will not follow. Re-consult if further needs arise.   Thank you,    Powell Bar MSN, RN-BC, [employer]

## 2024-01-08 NOTE — Progress Notes (Signed)
 PHARMACY - PHYSICIAN COMMUNICATION CRITICAL VALUE ALERT - BLOOD CULTURE IDENTIFICATION (BCID)  [name] is an 78 y.o. female who presented to [facility] on 01/07/2024 with a chief complaint of respiratory failure  Assessment:  1 of 4 blood cx with GPC/GNR; not identifed by BCID  Name of physician (or Provider) Contacted: R[place], V  Current antibiotics: none  Changes to prescribed antibiotics recommended:  None, suspected likely contaminant  Results for orders placed or performed during the hospital encounter of 01/07/24  Blood Culture ID Panel (Reflexed) (Collected: 01/07/2024  9:34 PM)  Result Value Ref Range   Enterococcus faecalis NOT DETECTED NOT DETECTED   Enterococcus Faecium NOT DETECTED NOT DETECTED   Listeria monocytogenes NOT DETECTED NOT DETECTED   Staphylococcus species NOT DETECTED NOT DETECTED   Staphylococcus aureus (BCID) NOT DETECTED NOT DETECTED   Staphylococcus epidermidis NOT DETECTED NOT DETECTED   Staphylococcus lugdunensis NOT DETECTED NOT DETECTED   Streptococcus species NOT DETECTED NOT DETECTED   Streptococcus agalactiae NOT DETECTED NOT DETECTED   Streptococcus pneumoniae NOT DETECTED NOT DETECTED   Streptococcus pyogenes NOT DETECTED NOT DETECTED   A.calcoaceticus-baumannii NOT DETECTED NOT DETECTED   Bacteroides fragilis NOT DETECTED NOT DETECTED   Enterobacterales NOT DETECTED NOT DETECTED   Enterobacter cloacae complex NOT DETECTED NOT DETECTED   Escherichia coli NOT DETECTED NOT DETECTED   Klebsiella aerogenes NOT DETECTED NOT DETECTED   Klebsiella oxytoca NOT DETECTED NOT DETECTED   Klebsiella pneumoniae NOT DETECTED NOT DETECTED   Proteus species NOT DETECTED NOT DETECTED   Salmonella species NOT DETECTED NOT DETECTED   Serratia marcescens NOT DETECTED NOT DETECTED   Haemophilus influenzae NOT DETECTED NOT DETECTED   Neisseria meningitidis NOT DETECTED NOT DETECTED   Pseudomonas aeruginosa NOT DETECTED NOT DETECTED   Stenotrophomonas  maltophilia NOT DETECTED NOT DETECTED   Candida albicans NOT DETECTED NOT DETECTED   Candida auris NOT DETECTED NOT DETECTED   Candida glabrata NOT DETECTED NOT DETECTED   Candida krusei NOT DETECTED NOT DETECTED   Candida parapsilosis NOT DETECTED NOT DETECTED   Candida tropicalis NOT DETECTED NOT DETECTED   Cryptococcus neoformans/gattii NOT DETECTED NOT DETECTED    Harlene Denna Berdine JONETTA, BCPS, BCCP Clinical Pharmacist  01/08/2024 9:35 PM   [facility] pharmacy phone numbers are listed on [email or web address]

## 2024-01-08 NOTE — Evaluation (Signed)
 Physical Therapy Evaluation Patient Details Name: [name] MRN: [id number] DOB: December 23, 1945 Today's Date: 01/08/2024  History of Present Illness  [name] is a 78 y.o. female who presents with shortness of breath.  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 Pt poor historian with inconsistent report of home set up, support, and PLOF. At this time pt requiring mod/maxA for transfers and ADLs. Per chart pt sleeps on a mattress on the floor however all patient could tell PT was that she was throwing away her bed and buying a new one. Pt reports having support of 4 daughters however no daughter is a contact in her chart, only a son whom I was unable to reach to verify PLOF, home set up, and support. At this time pt unsafe to return home as she is unable to care for self. Per chart patient recently admitted for frequent falls and currently pt presents with impaired balance and increased falls risk. Acute PT to cont to follow. Recommend inpatient rehab program < 3 hrs a day to address above deficits.        If plan is discharge home, recommend the following: A lot of help with walking and/or transfers;A lot of help with bathing/dressing/bathroom;Assist for transportation;Help with stairs or ramp for entrance;Supervision due to cognitive status   Can travel by private vehicle   No    Equipment Recommendations None recommended by PT  Recommendations for Other Services       Functional Status Assessment Patient has had a recent decline in their functional status and demonstrates the ability to make significant improvements in function in a reasonable and predictable amount of time.     Precautions / Restrictions  Precautions Precautions: Fall Recall of Precautions/Restrictions: Impaired Restrictions Weight Bearing Restrictions Per Provider Order: No      Mobility  Bed Mobility Overal bed mobility: Needs Assistance Bed Mobility: Supine to Sit, Sit to Supine     Supine to sit: HOB elevated, Used rails, Max assist Sit to supine: Mod assist, Used rails   General bed mobility comments: max directional cues, maxA for trunk elevation due to L lateral lean, maxA to scoot hips to EOB to place feet on floor, unable to use L UE functionally to assist, modA for LE management back into bed    Transfers Overall transfer level: Needs assistance Equipment used: Rolling walker (2 wheels) Transfers: Sit to/from Stand, Bed to chair/wheelchair/BSC Sit to Stand: Mod assist, From elevated surface   Step pivot transfers: Mod assist, From elevated surface       General transfer comment: despite max directional verbal cues and bed elevated pt required modA from front to power up into standing. Pt able to step pvt to chair with decreased step height on L LE, L LE in external rotation, despite verbal cues pt sat prematurely sitting on arm rests, pt with improved ability with step pvt back to edge of bed however refuses to push up from arm rest requiring PT to stabilize walker while pt pulling up on it    Ambulation/Gait               General Gait Details: limited to step pvt transfer  Stairs  Wheelchair Mobility     Tilt Bed    Modified Rankin (Stroke Patients Only)       Balance Overall balance assessment: Needs assistance Sitting-balance support: Single extremity supported, Feet supported Sitting balance-Leahy Scale: Poor Sitting balance - Comments: difficulty maintaining midline and up right posture, posterior/L lateral lean without ability to self correct Postural control: Posterior lean                                   Pertinent Vitals/Pain Pain  Assessment Pain Assessment: Faces Faces Pain Scale: No hurt    Home Living Family/patient expects to be discharged to:: Unsure Living Arrangements: Children;Other relatives                 Additional Comments: per patient she lives with her daughter and grandson, however no daughter is listed as a contact only a son. Attempted to reach son however unsuccessful. Pt reports having a tub shower with a seat, reports that she plans on throwing her bed out and buying a new one but doesn't know what kind, didn't state if she had stairs    Prior Function Prior Level of Function : Needs assist             Mobility Comments: pt reports using a RW and transferring self in/out of bed and walking around home but not leaving house ADLs Comments: reports daughter prepares meals, assists with dressing and bathing and drives     Extremity/Trunk Assessment   Upper Extremity Assessment Upper Extremity Assessment: Defer to OT evaluation (noted L UE weakness)    Lower Extremity Assessment Lower Extremity Assessment: LLE deficits/detail LLE Deficits / Details: generalized weakness, residual from previous stroke, difficulty advancing in step pvt transfer, holds in external rotation LLE Coordination: decreased gross motor    Cervical / Trunk Assessment Cervical / Trunk Assessment: Normal  Communication   Communication Communication: Impaired Factors Affecting Communication: Reduced clarity of speech    Cognition Arousal: Alert Behavior During Therapy: Flat affect   PT - Cognitive impairments: No family/caregiver present to determine baseline                       PT - Cognition Comments: unsure of baseline. unable to reach son (contact in chart) pt able to state date and being in the hospital however very delayed processing, impaired sequencing, decreased insight to deficits, inconsistent report of PLOF and home set up, states her daughter works yet makes all her meals and  assists her with ADLs, reports grandson is home because he lost his job and reports she has 3 other daughters however there is no daughter under her contacts only a son Following commands: Impaired Following commands impaired: Follows one step commands inconsistently, Follows one step commands with increased time     Cueing Cueing Techniques: Verbal cues, Gestural cues, Tactile cues, Visual cues     General Comments General comments (skin integrity, edema, etc.): pt soiled in urine, dependent for hygiene despite max directional verbal cues    Exercises     Assessment/Plan    PT Assessment Patient needs continued PT services  PT Problem List Decreased strength;Decreased range of motion;Decreased activity tolerance;Decreased balance;Decreased mobility;Decreased coordination;Decreased cognition       PT Treatment Interventions DME instruction;Gait training;Stair training;Functional mobility training;Therapeutic activities;Therapeutic exercise;Balance training;Neuromuscular re-education    PT Goals (Current goals can be found in the Care Plan section)  Acute Rehab PT Goals Patient Stated Goal: didn't state PT Goal Formulation: Patient unable to participate in goal setting Time For Goal Achievement: 01/22/24 Potential to Achieve Goals: Fair    Frequency Min 3X/week     Co-evaluation               AM-PAC PT 6 Clicks Mobility  Outcome Measure Help needed turning from your back to your side while in a flat bed without using bedrails?: A Lot Help needed moving from lying on your back to sitting on the side of a flat bed without using bedrails?: A Lot Help needed moving to and from a bed to a chair (including a wheelchair)?: A Lot Help needed standing up from a chair using your arms (e.g., wheelchair or bedside chair)?: A Lot Help needed to walk in hospital room?: Total Help needed climbing 3-5 steps with a railing? : Total 6 Click Score: 10    End of Session Equipment  Utilized During Treatment: Gait belt;Oxygen Activity Tolerance: Patient limited by fatigue Patient left: in bed;with call bell/phone within reach;with bed alarm set;with nursing/sitter in room (pt refused to sit in ch air) Nurse Communication: Mobility status (soiled in urine) PT Visit Diagnosis: Unsteadiness on feet (R26.81);Muscle weakness (generalized) (M62.81);Difficulty in walking, not elsewhere classified (R26.2)    Time: 0921-1005 PT Time Calculation (min) (ACUTE ONLY): 44 min   Charges:   PT Evaluation $PT Eval Moderate Complexity: 1 Mod PT Treatments $Therapeutic Activity: 23-37 mins PT General Charges $$ ACUTE PT VISIT: 1 Visit         Norene Ames, PT, DPT Acute Rehabilitation Services Secure chat preferred Office #: [phone]   Norene CHRISTELLA Ames 01/08/2024, 10:35 AM

## 2024-01-08 NOTE — H&P (Signed)
 History and Physical    [name] FMW:[id number] DOB: 05-09-1946 DOA: 01/07/2024  PCP: Pcp, No   Patient coming from: Home   Chief Complaint:    HPI:  [name] is a 78 y.o. 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 Reports onset x 1 day, at rest. Typically able to get around with a walker without any limiting dyspnea. She reports nonproductive cough after coming to the ED. No other URI symptoms. Does have diffuse myalgias. No known sick contacts. No GI iillnes, no dysphagia. Reports taking in adequate nutrition / hydration. She has no other complaints. Breathing has improved. She does not have inhalers at home.      Review of Systems:  ROS complete and negative except as marked above   Allergies  Allergen Reactions   Iodinated Contrast Media     9/1 developed diffuse itching after contrast dye for CT (without rash or airway involvement or GI symptoms) . Consider pretreatment before IV contrast in the future     Prior to Admission medications   Medication Sig Start Date End Date Taking? Authorizing Provider  amLODipine  (NORVASC ) 10 MG tablet Take 1 tablet (10 mg total) by mouth daily. 12/15/23  Yes Rojelio Nest, DO  hydrALAZINE  (APRESOLINE ) 50 MG tablet Take 1 tablet (50 mg total) by mouth every 8 (eight) hours. 12/15/23  Yes Rojelio Nest, DO  pantoprazole  (PROTONIX ) 40 MG tablet Take 1 tablet (40 mg total) by mouth daily. 12/15/23  Yes Rojelio Nest, DO  polyethylene glycol (MIRALAX  / GLYCOLAX ) 17 g packet Take 17 g by mouth daily. 12/15/23  Yes Rojelio Nest, DO  rosuvastatin  (CRESTOR ) 20 MG tablet TAKE 1 TABLET(20 MG) BY MOUTH DAILY Patient taking differently: Take 20 mg by  mouth daily. 08/23/23  Yes Lorren Greig PARAS, NP    Past Medical History:  Diagnosis Date   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 (HCC) 09/16/2020   COPD (chronic obstructive pulmonary disease) (HCC)    History of DVT (deep vein thrombosis)    Hypertension    Thoracoabdominal aortic aneurysm (HCC)    TIA (transient ischemic attack) 09/10/2020    Past Surgical History:  Procedure Laterality Date   IR BONE MARROW BIOPSY & ASPIRATION  12/13/2023   IR CT HEAD LTD  09/11/2020   IR INTRAVSC STENT CERV CAROTID W/O EMB-PROT MOD SED INC ANGIO  09/11/2020   IR PERCUTANEOUS ART THROMBECTOMY/INFUSION INTRACRANIAL INC DIAG ANGIO  09/11/2020       IR PERCUTANEOUS ART THROMBECTOMY/INFUSION INTRACRANIAL INC DIAG ANGIO  09/11/2020   IR US  GUIDE VASC ACCESS RIGHT  09/11/2020   NO PAST SURGERIES     RADIOLOGY WITH ANESTHESIA N/A 09/11/2020   Procedure: IR WITH ANESTHESIA;  Surgeon: Dolphus Carrion, MD;  Location: [facility] OR;  Service: Radiology;  Laterality: N/A;     reports that she quit smoking about 36 years ago. Her smoking use included cigarettes. She has never used smokeless tobacco. She reports that she does not currently use alcohol. She reports that she does not use drugs.  Family History  Problem Relation Age of Onset   Hypertension Mother      Physical Exam: Vitals:   01/07/24 2051 01/07/24 2100  BP: ROLLEN)  86/75 (!) 110/57  Pulse: 69 72  Resp: (!) 24   Temp: 97.8 F (36.6 C)   TempSrc: Rectal   SpO2: 100% 100%    Gen: Awake, alert, NAD, chronically ill appearing.   CV: Regular, normal S1, S2, no murmurs  Resp: Normal WOB, Good air movement, slightly coarse but otherwise CTAB  Abd: Flat, normoactive, nontender MSK: Symmetric, no edema  Skin: No rashes or lesions to exposed skin  Neuro: Alert and interactive, L hemiparesis  Psych: euthymic, appropriate    Data review:   Labs reviewed, notable for:   K 2.9,  Bicarb 21, AG 9  Cr 1  (baseline ~ 0.7)  Lacate 1.5  WBC 3.4 (down from prior), Hb 10 (up from recent b/l), and PLT 127 stable    Micro:  Results for orders placed or performed during the hospital encounter of 01/07/24  Resp panel by RT-PCR (RSV, Flu A&B, Covid) Anterior Nasal Swab     Status: None   Collection Time: 01/07/24  9:31 PM   Specimen: Anterior Nasal Swab  Result Value Ref Range Status   SARS Coronavirus 2 by RT PCR NEGATIVE NEGATIVE Final   Influenza A by PCR NEGATIVE NEGATIVE Final   Influenza B by PCR NEGATIVE NEGATIVE Final    Comment: (NOTE) The Xpert Xpress SARS-CoV-2/FLU/RSV plus assay is intended as an aid in the diagnosis of influenza from Nasopharyngeal swab specimens and should not be used as a sole basis for treatment. Nasal washings and aspirates are unacceptable for Xpert Xpress SARS-CoV-2/FLU/RSV testing.  Fact Sheet for Patients: BloggerCourse.com  Fact Sheet for Healthcare Providers: SeriousBroker.it  This test is not yet approved or cleared by the [place]  FDA and has been authorized for detection and/or diagnosis of SARS-CoV-2 by FDA under an Emergency Use Authorization (EUA). This EUA will remain in effect (meaning this test can be used) for the duration of the COVID-19 declaration under Section 564(b)(1) of the Act, 21 U.S.C. section 360bbb-3(b)(1), unless the authorization is terminated or revoked.     Resp Syncytial Virus by PCR NEGATIVE NEGATIVE Final    Comment: (NOTE) Fact Sheet for Patients: BloggerCourse.com  Fact Sheet for Healthcare Providers: SeriousBroker.it  This test is not yet approved or cleared by the [place]  FDA and has been authorized for detection and/or diagnosis of SARS-CoV-2 by FDA under an Emergency Use Authorization (EUA). This EUA will remain in effect (meaning this test can be used) for the duration of the COVID-19 declaration  under Section 564(b)(1) of the Act, 21 U.S.C. section 360bbb-3(b)(1), unless the authorization is terminated or revoked.  Performed at [facility], [address] [place], [place] [address]     Imaging reviewed:  CT Angio Chest PE W/Cm &/Or Wo Cm Result Date: 01/07/2024 CLINICAL DATA:  High probability pulmonary embolism, dyspnea, chest pain EXAM: CT ANGIOGRAPHY CHEST WITH CONTRAST TECHNIQUE: Multidetector CT imaging of the chest was performed using the standard protocol during bolus administration of intravenous contrast. Multiplanar CT image reconstructions and MIPs were obtained to evaluate the vascular anatomy. RADIATION DOSE REDUCTION: This exam was performed according to the departmental dose-optimization program which includes automated exposure control, adjustment of the mA and/or kV according to patient size and/or use of iterative reconstruction technique. CONTRAST:  75mL OMNIPAQUE  IOHEXOL  350 MG/ML SOLN COMPARISON:  12/10/2023 FINDINGS: Cardiovascular: Moderate multi-vessel coronary artery calcification. Cardiac size is within normal limits. Adequate opacification of the pulmonary arterial tree. No intraluminal filling defect identified to suggest acute pulmonary embolism. Central pulmonary  arteries are of normal caliber. No pericardial effusion. Moderate atherosclerotic calcification within the thoracic aorta. 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 Mediastinum/Nodes: Visualized thyroid is unremarkable. She esophagus is unremarkable. Moderate hiatal hernia. Shotty mediastinal adenopathy has developed, nonspecific, possibly reactive in nature. No frankly pathologic adenopathy is identified. Lungs/Pleura: And like parenchymal scarring within the posterior basal left lower lobe with associated left-sided  volume loss. Calcified pleural plaques are identified on the left, likely the sequela of remote trauma or inflammation. Interval development of bronchial wall thickening in keeping with airway inflammation, most evident within the central airways of the mid and lower lung zones. No confluent pulmonary infiltrate. Mild emphysema. No pneumothorax or pleural effusion. Bronchomalacia involving the bronchus intermedius Upper Abdomen: No acute abnormality. Musculoskeletal: No chest wall abnormality. No acute or significant osseous findings. Review of the MIP images confirms the above findings. IMPRESSION: 1. No pulmonary embolism. 2. Moderate multi-vessel coronary artery calcification. 3. 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 Recommend annual imaging followup by CTA or MRA. This recommendation follows 2010 ACCF/AHA/AATS/ACR/ASA/SCA/SCAI/SIR/STS/SVM Guidelines for the Diagnosis and Management of Patients with Thoracic Aortic Disease. Circulation. 2010; 121: Z733-z630. Aortic aneurysm NOS (ICD10-I71.9) 4. 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 5. Interval development of bronchial wall thickening in keeping with airway inflammation, most evident within the central airways of the mid and lower lung zones. 6. Bronchomalacia involving the bronchus intermedius. 7. Mild emphysema. 8. Moderate hiatal hernia. Aortic Atherosclerosis (ICD10-I70.0) and Emphysema (ICD10-J43.9). Electronically Signed   By: Dorethia Molt M.D.   On: 01/07/2024 23:16   DG Chest Port 1 View Result Date: 01/07/2024 CLINICAL DATA:  Shortness of breath. EXAM: PORTABLE CHEST 1 VIEW COMPARISON:  04/13/2018, chest CT 12/10/2023 FINDINGS: Patient is rotated. The heart is normal in size. Stable mediastinal contours allowing for rotation. Aortic atherosclerosis. Again seen elevation of right hemidiaphragm. Left pleural  calcifications. No acute airspace disease, large pleural effusion or pneumothorax. No pulmonary edema. IMPRESSION: 1. No acute chest findings. 2. Chronic left pleural calcifications. Electronically Signed   By: Andrea Gasman M.D.   On: 01/07/2024 21:49    EKG:  Personally reviewed, SR with LVH, Diffuse ST depression and aVR elevation; per review of prior similar to old EKG. Likely repolarization change   ED Course:  Treated with NS 500 cc followed by rate at 125 / hr, Kcl 40. After contrast had diffuse itching without rash or other systemic signs, treated with IV benadryl  and Dexamethasone  10 mg.    Assessment/Plan:  78 y.o. female with hx CVA in '22 requiring thrombectomy and subsequent SAH / IVH, HTN, HLD, COPD, DVT during pregnancy not on AC; Chronic R femoral DVT persists, aortic aneurysm/ aortic ulcer, lung nodule, recent hx of recurrent ground level falls, and severe symptomatic anemia (underwent BMBx which was normal), thrombocytopenia who presents with SOB, possible AHRF in the field (placed on O2 by EMS). Likely viral pneumonia, and additional findings of dehydration / electrolyte abnormalities.   ? AHRF - resolved  Suspect Viral LRI  Placed on 6L O2 by EMS although no documented desaturation that I can see in chart. Her sat is 100% on 4L in the ED. I have shut off her O2 and her sat remained stable  at 94% off O2. Tachypneic in mid 20s. Exam with coarse lung sounds, but otherwise clear, no wheezes. Flu / COVID / RSV neg. CTA for PE negative for PE or airspace disease, but does demonstrate bronchial wall thickening. Suspect I/s/o recent viral syndrome likely has viral LRI.  -- Check Sputum Cx, RVP -- Hold off on Abx for now with suspected viral cause  - DuoNebs every 6 hours scheduled, albuterol  every 4 hours as needed, incentive spirometer, flutter valve, encourage out of bed to chair. - Would benefit from Rx for albuterol  inhaler at discharge   Dehydration  Hypotension, likely  hypovolemic  Hypokalemia  -- Give additional 500 cc then continue mIVF at 100 cc/hr overnight  -- Hold home antihypertensives for now   -- Additional Kcl repletion, check Mg / phos   ? Contrast allergy  Itching without rash or other systemic signs, mild allergy. Treated with Benadryl  and dexamethasone  in the ED.  -- Added to allergy list, consider pretreatment before contrast in future.   Incidental findings:  Moderate CAD by imaging  Bronchomalacia of BI   Chronic medical problems:  hx CVA in '22 requiring thrombectomy and subsequent SAH / IVH: Noted, she is not on AntiPLT. Continue Rosuvastatin   HTN: Hold home amlodipine , hydralazine  in setting of initial hypotension  HLD: see CVA  COPD: see acute mgmt above DVT during pregnancy not on AC; Chronic R femoral DVT persists: Noted on duplex in 7/'25.  aortic aneurysm/ aortic ulcer: Imaging with CTA noting stable 3.6 cm descending thoracic aortic aneurysm, and stable aortic arch ulcer 1 x 0.6 cm. Outpatient surveillance recommended.  lung nodule: Outpatient surveillance  GERD: continue home PPI    There is no height or weight on file to calculate BMI.    DVT prophylaxis:  SCDs Code Status:  Full Code Diet:  Diet Orders (From admission, onward)    None      Family Communication:  None   Consults:  None   Admission status:   Observation, Telemetry bed  Severity of Illness: The appropriate patient status for this patient is OBSERVATION. Observation status is judged to be reasonable and necessary in order to provide the required intensity of service to ensure the patient's safety. The patient's presenting symptoms, physical exam findings, and initial radiographic and laboratory data in the context of their medical condition is felt to place them at decreased risk for further clinical deterioration. Furthermore, it is anticipated that the patient will be medically stable for discharge from the hospital within 2 midnights of  admission.    Dorn Dawson, MD [facility]  How to contact the [facility] Attending or Consulting provider 7A - 7P or covering provider during after hours 7P -7A, for this patient.  Check the care team in [facility] and look for a) attending/consulting [facility] provider listed and b) the [facility] team listed Log into [email or web address] and use [facility]'s universal password to access. If you do not have the password, please contact the hospital operator. Locate the [facility] provider you are looking for under [facility] and page to a number that you can be directly reached. If you still have difficulty reaching the provider, please page the [facility] (Director on Call) for the Hospitalists listed on amion for assistance.  01/08/2024, 12:30 AM

## 2024-01-09 ENCOUNTER — Other Ambulatory Visit (HOSPITAL_COMMUNITY): Payer: Self-pay

## 2024-01-09 DIAGNOSIS — E876 Hypokalemia: Secondary | ICD-10-CM

## 2024-01-09 DIAGNOSIS — E785 Hyperlipidemia, unspecified: Secondary | ICD-10-CM

## 2024-01-09 DIAGNOSIS — J9601 Acute respiratory failure with hypoxia: Secondary | ICD-10-CM | POA: Diagnosis not present

## 2024-01-09 DIAGNOSIS — I110 Hypertensive heart disease with heart failure: Secondary | ICD-10-CM | POA: Diagnosis not present

## 2024-01-09 DIAGNOSIS — N390 Urinary tract infection, site not specified: Secondary | ICD-10-CM | POA: Insufficient documentation

## 2024-01-09 DIAGNOSIS — E860 Dehydration: Secondary | ICD-10-CM | POA: Diagnosis not present

## 2024-01-09 DIAGNOSIS — I10 Essential (primary) hypertension: Secondary | ICD-10-CM

## 2024-01-09 DIAGNOSIS — L8990 Pressure ulcer of unspecified site, unspecified stage: Secondary | ICD-10-CM | POA: Insufficient documentation

## 2024-01-09 MED ORDER — SODIUM CHLORIDE 0.9 % IV SOLN: 1.0000 g | INTRAVENOUS | Status: DC

## 2024-01-09 MED ORDER — CEFUROXIME AXETIL 500 MG PO TABS: 500.0000 mg | ORAL_TABLET | Freq: Two times a day (BID) | ORAL | 0 refills | Status: DC

## 2024-01-09 MED ORDER — ALBUTEROL SULFATE HFA 108 (90 BASE) MCG/ACT IN AERS: 2.0000 | INHALATION_SPRAY | Freq: Four times a day (QID) | RESPIRATORY_TRACT | 2 refills | Status: AC | PRN

## 2024-01-09 MED ORDER — AMLODIPINE BESYLATE 10 MG PO TABS: 10.0000 mg | ORAL_TABLET | Freq: Every day | ORAL | Status: DC

## 2024-01-09 NOTE — Progress Notes (Signed)
 RN walked patient for 4 minutes in room and in hallway. Patient used a front-wheeled walker to ambulate and required assistance standing from the bed.   Patient remained on room air for the duration of the walk and sustained oxygen levels from 98-100%. Pulse ranged from 70-82 beats per minute. No other abnormal symptoms noted with ambulation. No supplemental oxygen was required.

## 2024-01-09 NOTE — TOC Progression Note (Addendum)
 Transition of Care [facility]) - Progression Note    Patient Details  Name: [name] MRN: [id number] Date of Birth: 01-Aug-1945  [facility] of [facility]) CM/SW Contact  Rosaline JONELLE Joe, RN Phone Number: 01/09/2024, 11:06 AM  Clinical Narrative:    CM met with the patient at the bedside and I called the patient's son, [name], by phone to discuss a discharge plan for the patient.  The patient declined SNF placement and requests to return home with home health services.  Patient's son declined patient moving to his home to live with him in [place]  at this time.  Patient states that she lives with her grandson and daughter, [name] at the home and states that both family members are able to provide 24 hour care since they are present in the home and do not work outside of the home at this time.  DME at the home includes RW, 3:1, WC, tub shower seat and bed mattress remains in the floor at the home since the patient states that she does not like her bed frame:.  I discussed with the son and patient that patient speak with her PCP about Personal Care Services.  I called and scheduled a hospital follow up with Greig Drones, NP - apt scheduled for January 15, 2024 at 10 am, office was made aware and noted in the appointment to please assist the patient with set up for Personal Care Services.  Patient states that she would like to return home and declined resources for senior services for ALF in the future.  Patient was offered Medicare choice regarding home health and patient did not have a preference.  Patient was set up with [facility] - HH orders were placed for [facility] PT and MSW - to be co-signed by MD.  Patient states that when she is stable to discharge that she will need transportation assistance to home by taxi.  Patient states that she would notify her family at this time to meet her at the taxi since her family members do not drive.  01/09/24 1121 - [facility] home [facility] called and states that  patient is already active with [facility] services for the patient - HH orders updated to include RN, PT, OT, MSW.  I asked MSW with [facility] to follow up in regarding to MSW and need for [facility] Services.  01/09/24 1505 - Patient is being discharged by MD today.  Patient completed walk test and does not need oxygen for home.  I asked that the bedside nurse instruct the patient to notify her family that she is being discharge so that the family can assist the patient to the home from the taxi since patient uses RW/WC.  Taxi voucher was provided to the charge RN to confirm that patient has family waiting on her before transportation is arranged for home.  I called the patient's son, [name] by phone and he states that he does not know his sister's number at the house but states that it will be safe for patient to go home by taxi and that the family will be at the home to assist the patient in the home by RW.  Patient's cell phone is uncharged and she is unable to call the family and just want to go home.  Charge RN is aware.   Expected Discharge Plan: Skilled Nursing Facility Barriers to Discharge: Continued Medical Work up, English as a second language teacher, SNF Pending bed offer  Expected Discharge Plan and Services In-house Referral: Clinical Social Work   Post Acute Care Choice: Skilled Nursing Facility, Home Health Living arrangements for the past 2 months: Single Family Home                                       Social Drivers of Health (SDOH) Interventions SDOH Screenings   Food Insecurity: No Food Insecurity (01/08/2024)  Housing: Low Risk  (01/08/2024)  Transportation Needs: No Transportation Needs (01/08/2024)  Utilities: Not At Risk (01/08/2024)  Depression (PHQ2-9): Low Risk  (06/04/2021)  Social Connections: Socially Isolated (01/08/2024)  Tobacco Use: Medium Risk (01/07/2024)    Readmission Risk Interventions     No data to display

## 2024-01-09 NOTE — TOC Initial Note (Signed)
 Transition of Care [facility]) - Initial/Assessment Note    Patient Details  Name: [name] MRN: [id number] Date of Birth: 28-Jun-1945  [facility]) CM/SW Contact:    Arseniy Toomey A [place], LCSW Phone Number: 01/09/2024, 10:31 AM  Clinical Narrative:                  CSW met with pt at bedside and spoke with her and son, Darryl, via phone. Darryl lives out of state in [place] .  Pt is from home with grandson. CSW informed her of recommendation for SNF. She stated that she would prefer to go home, declined SNF, said she has been to rehab before and all it did was give me a bed sore.  Per chart review, pt has been to [facility] in the past. Stated she have a wheelchair and rolling walker, tub bench at home and that her grandson is helpful in the home. Agreeable to Home health services at [place]. RNCM [place] notified, to follow up with pt.   Dispo plan for home with home health vs SNF.   Inpatient Case Management will continue to follow.   Expected Discharge Plan: Skilled Nursing Facility Barriers to Discharge: Continued Medical Work up, English as a second language teacher, SNF Pending bed offer   Patient Goals and CMS Choice Patient states their goals for this hospitalization and ongoing recovery are:: go home CMS Medicare.gov Compare Post Acute Care list provided to:: Patient        Expected Discharge Plan and Services In-house Referral: Clinical Social Work   Post Acute Care Choice: Skilled Nursing Facility, Home Health Living arrangements for the past 2 months: Single Family Home                                      Prior Living Arrangements/Services Living arrangements for the past 2 months: Single Family Home Lives with:: Other (Comment) (adult grandson)          Need for Family Participation in Patient Care: Yes (Comment) Care giver support system in place?: Yes (comment) (pt's son Darryl) Current home services: DME    Activities of Daily Living   ADL Screening  (condition at time of admission) Independently performs ADLs?: No Does the patient have a NEW difficulty with bathing/dressing/toileting/self-feeding that is expected to last >3 days?: No Does the patient have a NEW difficulty with getting in/out of bed, walking, or climbing stairs that is expected to last >3 days?: No Does the patient have a NEW difficulty with communication that is expected to last >3 days?: No Is the patient deaf or have difficulty hearing?: Yes Does the patient have difficulty seeing, even when wearing glasses/contacts?: Yes Does the patient have difficulty concentrating, remembering, or making decisions?: Yes  Permission Sought/Granted                  Emotional Assessment Appearance:: Appears stated age Attitude/Demeanor/Rapport: Engaged Affect (typically observed): Pleasant Orientation: : Oriented to Self, Oriented to Place, Oriented to Situation Alcohol / Substance Use: Not Applicable Psych Involvement: No (comment)  Admission diagnosis:  Acute on chronic respiratory failure with hypoxia (HCC) [J96.21] Acute hypoxic respiratory failure (HCC) [J96.01] Patient Active Problem List   Diagnosis Date Noted   Acute hypoxic respiratory failure (HCC) 01/08/2024   Dehydration 01/08/2024   Viral pneumonia 01/08/2024   Symptomatic anemia 12/11/2023   Low folate 12/11/2023   Right shoulder pain    Acute blood  loss anemia    Essential hypertension    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   Intraventricular hemorrhage (HCC) 09/16/2020   History of TIA (transient ischemic attack) and stroke 09/16/2020   History of COPD    Dyslipidemia    PCP:  Pcp, No Pharmacy:   [id number] - [place], [place] - [address] AT [address] [place] [address] Phone: [phone] Fax: [phone]     Social Drivers of Health (SDOH) Social History: SDOH Screenings   Food Insecurity: No Food Insecurity (01/08/2024)  Housing: Low Risk   (01/08/2024)  Transportation Needs: No Transportation Needs (01/08/2024)  Utilities: Not At Risk (01/08/2024)  Depression (PHQ2-9): Low Risk  (06/04/2021)  Social Connections: Socially Isolated (01/08/2024)  Tobacco Use: Medium Risk (01/07/2024)   SDOH Interventions:     Readmission Risk Interventions     No data to display

## 2024-01-09 NOTE — Plan of Care (Signed)
  Problem: Clinical Measurements: Goal: Respiratory complications will improve Outcome: Progressing   Problem: Pain Managment: Goal: General experience of comfort will improve and/or be controlled Outcome: Progressing   Problem: Skin Integrity: Goal: Risk for impaired skin integrity will decrease Outcome: Progressing

## 2024-01-09 NOTE — Discharge Summary (Incomplete)
 Physician Discharge Summary   Patient: [name] MRN: [id number] DOB: 02/14/1946  Admit date:     01/07/2024  Discharge date: {dischdate:26783}  Discharge Physician: Concepcion Riser   PCP: Pcp, No   Recommendations at discharge:  {Tip this will not be part of the note when signed- Example include specific recommendations for outpatient follow-up, pending tests to follow-up on. (Optional):26781}  ***  Discharge Diagnoses: Principal Problem:   Acute hypoxic respiratory failure (HCC) Active Problems:   Dyslipidemia   Essential hypertension   Dehydration   Viral pneumonia   UTI (urinary tract infection)   Pressure injury of skin  Resolved Problems:   * No resolved hospital problems. [facility] Course: No notes on file  Assessment and Plan: No notes have been filed under this hospital service. Service: Hospitalist     {Tip this will not be part of the note when signed Body mass index is 29.07 kg/m. , ,  Active Pressure Injury/Wound(s)     None          (Optional):26781}  {(NOTE) Pain control PDMP Statment (Optional):26782} Consultants: *** Procedures performed: ***  Disposition: {Plan; Disposition:26390} Diet recommendation:  Discharge Diet Orders (From admission, onward)     Start     Ordered   01/09/24 0000  Diet - low sodium heart healthy        01/09/24 1445           {Diet_Plan:26776} DISCHARGE MEDICATION: Allergies as of 01/09/2024       Reactions   Iodinated Contrast Media    9/1 developed diffuse itching after contrast dye for CT (without rash or airway involvement or GI symptoms) . Consider pretreatment before IV contrast in the future         Medication List     TAKE these medications    albuterol  108 (90 Base) MCG/ACT inhaler Commonly known as: VENTOLIN  HFA Inhale 2 puffs into the lungs every 6 (six) hours as needed for wheezing or shortness of breath.   amLODipine  10 MG tablet Commonly known as: NORVASC  Take 1 tablet  (10 mg total) by mouth daily.   cefUROXime  500 MG tablet Commonly known as: CEFTIN  Take 1 tablet (500 mg total) by mouth 2 (two) times daily with a meal for 3 days.   hydrALAZINE  50 MG tablet Commonly known as: APRESOLINE  Take 1 tablet (50 mg total) by mouth every 8 (eight) hours.   pantoprazole  40 MG tablet Commonly known as: PROTONIX  Take 1 tablet (40 mg total) by mouth daily.   polyethylene glycol 17 g packet Commonly known as: MIRALAX  / GLYCOLAX  Take 17 g by mouth daily.   rosuvastatin  20 MG tablet Commonly known as: CRESTOR  TAKE 1 TABLET(20 MG) BY MOUTH DAILY What changed: See the new instructions.               Discharge Care Instructions  (From admission, onward)           Start     Ordered   01/09/24 0000  Discharge wound care:       Comments: Cleanse buttocks/coccyx wounds with soap and water, dry and apply Xeroform gauze Soila [phone]) to wound beds daily.  Secure with silicone foam   01/09/24 1445            Follow-up Information     Lorren Greig PARAS, NP Follow up on 01/15/2024.   Specialty: Nurse Practitioner Why: You are scheduled for a hospital follow up on Monday, January 15, 2024 at 10 am.  Contact information: [address] [place] [address] [phone]         [facility] Follow up.   Specialty: Home Health Services Why: [facility] will continue to provide home health services. Contact information: [address] [place] [address] [phone]                Discharge Exam: [name]   01/08/24 0248  Weight: 91.9 kg   ***  Condition at discharge: {DC Condition:26389}  The results of significant diagnostics from this hospitalization (including imaging, microbiology, ancillary and laboratory) are listed below for reference.   Imaging Studies: CT Angio Chest PE W/Cm &/Or Wo Cm Result Date: 01/07/2024 CLINICAL DATA:  High probability pulmonary embolism, dyspnea, chest pain EXAM:  CT ANGIOGRAPHY CHEST WITH CONTRAST TECHNIQUE: Multidetector CT imaging of the chest was performed using the standard protocol during bolus administration of intravenous contrast. Multiplanar CT image reconstructions and MIPs were obtained to evaluate the vascular anatomy. RADIATION DOSE REDUCTION: This exam was performed according to the departmental dose-optimization program which includes automated exposure control, adjustment of the mA and/or kV according to patient size and/or use of iterative reconstruction technique. CONTRAST:  75mL OMNIPAQUE  IOHEXOL  350 MG/ML SOLN COMPARISON:  12/10/2023 FINDINGS: Cardiovascular: Moderate multi-vessel coronary artery calcification. Cardiac size is within normal limits. Adequate opacification of the pulmonary arterial tree. No intraluminal filling defect identified to suggest acute pulmonary embolism. Central pulmonary arteries are of normal caliber. No pericardial effusion. Moderate atherosclerotic calcification within the thoracic aorta. 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 Mediastinum/Nodes: Visualized thyroid is unremarkable. She esophagus is unremarkable. Moderate hiatal hernia. Shotty mediastinal adenopathy has developed, nonspecific, possibly reactive in nature. No frankly pathologic adenopathy is identified. Lungs/Pleura: And like parenchymal scarring within the posterior basal left lower lobe with associated left-sided volume loss. Calcified pleural plaques are identified on the left, likely the sequela of remote trauma or inflammation. Interval development of bronchial wall thickening in keeping with airway inflammation, most evident within the central airways of the mid and lower lung zones. No confluent pulmonary infiltrate. Mild emphysema. No pneumothorax or pleural effusion.  Bronchomalacia involving the bronchus intermedius Upper Abdomen: No acute abnormality. Musculoskeletal: No chest wall abnormality. No acute or significant osseous findings. Review of the MIP images confirms the above findings. IMPRESSION: 1. No pulmonary embolism. 2. Moderate multi-vessel coronary artery calcification. 3. 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 Recommend annual imaging followup by CTA or MRA. This recommendation follows 2010 ACCF/AHA/AATS/ACR/ASA/SCA/SCAI/SIR/STS/SVM Guidelines for the Diagnosis and Management of Patients with Thoracic Aortic Disease. Circulation. 2010; 121: Z733-z630. Aortic aneurysm NOS (ICD10-I71.9) 4. 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 5. Interval development of bronchial wall thickening in keeping with airway inflammation, most evident within the central airways of the mid and lower lung zones. 6. Bronchomalacia involving the bronchus intermedius. 7. Mild emphysema. 8. Moderate hiatal hernia. Aortic Atherosclerosis (ICD10-I70.0) and Emphysema (ICD10-J43.9). Electronically Signed   By: Dorethia Molt M.D.   On: 01/07/2024 23:16   DG Chest Port 1 View Result Date: 01/07/2024 CLINICAL DATA:  Shortness of breath. EXAM: PORTABLE CHEST 1 VIEW COMPARISON:  04/13/2018, chest CT 12/10/2023 FINDINGS: Patient is rotated. The heart is normal in size. Stable  mediastinal contours allowing for rotation. Aortic atherosclerosis. Again seen elevation of right hemidiaphragm. Left pleural calcifications. No acute airspace disease, large pleural effusion or pneumothorax. No pulmonary edema. IMPRESSION: 1. No acute chest findings. 2. Chronic left pleural calcifications. Electronically Signed   By: Andrea Gasman M.D.   On: 01/07/2024 21:49   IR BONE MARROW BIOPSY & ASPIRATION Result Date: 12/13/2023 CLINICAL DATA:  Anemia EXAM: FLUOROSCOPIC GUIDED DEEP  ILIAC BONE ASPIRATION AND CORE BIOPSY TECHNIQUE: Patient was placed prone on the fluoro table and limited images through the pelvis were obtained. Appropriate skin entry site was identified. Skin site was marked, prepped with chlorhexidine , draped in usual sterile fashion, and infiltrated locally with 1% lidocaine . Intravenous Fentanyl  50mcg and Versed  1mg  were administered as conscious sedation during continuous monitoring of the patient's level of consciousness and physiological / cardiorespiratory status by the radiology RN, with a total moderate sedation time of 10 minutes. Under fluoroscopic guidance an 11-gauge trocar bone needle was advanced into the left iliac bone just lateral to the sacroiliac joint. Once needle tip position was confirmed, core and aspiration samples were obtained, submitted to pathology for approval. Patient tolerated procedure well. COMPLICATIONS: COMPLICATIONS none IMPRESSION: 1. Technically successful fluoro guided left iliac bone core and aspiration biopsy. Electronically Signed   By: JONETTA Faes M.D.   On: 12/13/2023 13:57    Microbiology: Results for orders placed or performed during the hospital encounter of 01/07/24  Culture, blood (Routine X 2) w Reflex to ID Panel     Status: None (Preliminary result)   Collection Time: 01/07/24  9:30 PM   Specimen: BLOOD RIGHT ARM  Result Value Ref Range Status   Specimen Description BLOOD RIGHT ARM  Final   Special Requests   Final    BOTTLES DRAWN AEROBIC AND ANAEROBIC Blood Culture results may not be optimal due to an inadequate volume of blood received in culture bottles   Culture  Setup Time   Final    GRAM POSITIVE RODS AEROBIC BOTTLE ONLY CRITICAL VALUE NOTED.  VALUE IS CONSISTENT WITH PREVIOUSLY REPORTED AND CALLED VALUE. Performed at [facility], [address] [place], [place] [address]    Culture GRAM POSITIVE RODS  Final   Report Status PENDING  Incomplete  Resp panel by RT-PCR (RSV, Flu A&B, Covid)  Anterior Nasal Swab     Status: None   Collection Time: 01/07/24  9:31 PM   Specimen: Anterior Nasal Swab  Result Value Ref Range Status   SARS Coronavirus 2 by RT PCR NEGATIVE NEGATIVE Final   Influenza A by PCR NEGATIVE NEGATIVE Final   Influenza B by PCR NEGATIVE NEGATIVE Final    Comment: (NOTE) The Xpert Xpress SARS-CoV-2/FLU/RSV plus assay is intended as an aid in the diagnosis of influenza from Nasopharyngeal swab specimens and should not be used as a sole basis for treatment. Nasal washings and aspirates are unacceptable for Xpert Xpress SARS-CoV-2/FLU/RSV testing.  Fact Sheet for Patients: BloggerCourse.com  Fact Sheet for Healthcare Providers: SeriousBroker.it  This test is not yet approved or cleared by the [place]  FDA and has been authorized for detection and/or diagnosis of SARS-CoV-2 by FDA under an Emergency Use Authorization (EUA). This EUA will remain in effect (meaning this test can be used) for the duration of the COVID-19 declaration under Section 564(b)(1) of the Act, 21 U.S.C. section 360bbb-3(b)(1), unless the authorization is terminated or revoked.     Resp Syncytial Virus by PCR NEGATIVE NEGATIVE Final    Comment: (NOTE)  Fact Sheet for Patients: BloggerCourse.com  Fact Sheet for Healthcare Providers: SeriousBroker.it  This test is not yet approved or cleared by the [place]  FDA and has been authorized for detection and/or diagnosis of SARS-CoV-2 by FDA under an Emergency Use Authorization (EUA). This EUA will remain in effect (meaning this test can be used) for the duration of the COVID-19 declaration under Section 564(b)(1) of the Act, 21 U.S.C. section 360bbb-3(b)(1), unless the authorization is terminated or revoked.  Performed at [facility], [address] [place], [place] [address]   Respiratory (~20 pathogens) panel by PCR      Status: None   Collection Time: 01/07/24  9:31 PM   Specimen: Nasopharyngeal Swab; Respiratory  Result Value Ref Range Status   Adenovirus NOT DETECTED NOT DETECTED Final   Coronavirus 229E NOT DETECTED NOT DETECTED Final    Comment: (NOTE) The Coronavirus on the Respiratory Panel, DOES NOT test for the novel  Coronavirus (2019 nCoV)    Coronavirus HKU1 NOT DETECTED NOT DETECTED Final   Coronavirus NL63 NOT DETECTED NOT DETECTED Final   Coronavirus OC43 NOT DETECTED NOT DETECTED Final   Metapneumovirus NOT DETECTED NOT DETECTED Final   Rhinovirus / Enterovirus NOT DETECTED NOT DETECTED Final   Influenza A NOT DETECTED NOT DETECTED Final   Influenza B NOT DETECTED NOT DETECTED Final   Parainfluenza Virus 1 NOT DETECTED NOT DETECTED Final   Parainfluenza Virus 2 NOT DETECTED NOT DETECTED Final   Parainfluenza Virus 3 NOT DETECTED NOT DETECTED Final   Parainfluenza Virus 4 NOT DETECTED NOT DETECTED Final   Respiratory Syncytial Virus NOT DETECTED NOT DETECTED Final   Bordetella pertussis NOT DETECTED NOT DETECTED Final   Bordetella Parapertussis NOT DETECTED NOT DETECTED Final   Chlamydophila pneumoniae NOT DETECTED NOT DETECTED Final   Mycoplasma pneumoniae NOT DETECTED NOT DETECTED Final    Comment: Performed at [facility], [address], [place], [place] [address]  Culture, blood (Routine X 2) w Reflex to ID Panel     Status: None (Preliminary result)   Collection Time: 01/07/24  9:34 PM   Specimen: BLOOD RIGHT ARM  Result Value Ref Range Status   Specimen Description BLOOD RIGHT ARM  Final   Special Requests   Final    BOTTLES DRAWN AEROBIC AND ANAEROBIC Blood Culture results may not be optimal due to an inadequate volume of blood received in culture bottles   Culture  Setup Time   Final    GRAM POSITIVE COCCI GRAM POSITIVE RODS BOTTLES DRAWN AEROBIC ONLY CRITICAL RESULT CALLED TO, READ BACK BY AND VERIFIED WITH: MAYA DOROTHA BARLOW [id number] @ 2128 FH Performed at  [facility], [address] [place], [place] [address]    Culture GRAM POSITIVE COCCI GRAM POSITIVE RODS   Final   Report Status PENDING  Incomplete  Blood Culture ID Panel (Reflexed)     Status: None   Collection Time: 01/07/24  9:34 PM  Result Value Ref Range Status   Enterococcus faecalis NOT DETECTED NOT DETECTED Final   Enterococcus Faecium NOT DETECTED NOT DETECTED Final   Listeria monocytogenes NOT DETECTED NOT DETECTED Final   Staphylococcus species NOT DETECTED NOT DETECTED Final   Staphylococcus aureus (BCID) NOT DETECTED NOT DETECTED Final   Staphylococcus epidermidis NOT DETECTED NOT DETECTED Final   Staphylococcus lugdunensis NOT DETECTED NOT DETECTED Final   Streptococcus species NOT DETECTED NOT DETECTED Final   Streptococcus agalactiae NOT DETECTED NOT DETECTED Final   Streptococcus pneumoniae  NOT DETECTED NOT DETECTED Final   Streptococcus pyogenes NOT DETECTED NOT DETECTED Final   A.calcoaceticus-baumannii NOT DETECTED NOT DETECTED Final   Bacteroides fragilis NOT DETECTED NOT DETECTED Final   Enterobacterales NOT DETECTED NOT DETECTED Final   Enterobacter cloacae complex NOT DETECTED NOT DETECTED Final   Escherichia coli NOT DETECTED NOT DETECTED Final   Klebsiella aerogenes NOT DETECTED NOT DETECTED Final   Klebsiella oxytoca NOT DETECTED NOT DETECTED Final   Klebsiella pneumoniae NOT DETECTED NOT DETECTED Final   Proteus species NOT DETECTED NOT DETECTED Final   Salmonella species NOT DETECTED NOT DETECTED Final   Serratia marcescens NOT DETECTED NOT DETECTED Final   Haemophilus influenzae NOT DETECTED NOT DETECTED Final   Neisseria meningitidis NOT DETECTED NOT DETECTED Final   Pseudomonas aeruginosa NOT DETECTED NOT DETECTED Final   Stenotrophomonas maltophilia NOT DETECTED NOT DETECTED Final   Candida albicans NOT DETECTED NOT DETECTED Final   Candida auris NOT DETECTED NOT DETECTED Final   Candida glabrata NOT DETECTED NOT DETECTED Final    Candida krusei NOT DETECTED NOT DETECTED Final   Candida parapsilosis NOT DETECTED NOT DETECTED Final   Candida tropicalis NOT DETECTED NOT DETECTED Final   Cryptococcus neoformans/gattii NOT DETECTED NOT DETECTED Final    Comment: Performed at [facility], [address], [place], [place] [address]  Urine Culture     Status: Abnormal (Preliminary result)   Collection Time: 01/08/24  4:19 AM   Specimen: Urine, Random  Result Value Ref Range Status   Specimen Description URINE, RANDOM  Final   Special Requests NONE Reflexed from [id number]  Final   Culture (A)  Final    >=100,000 COLONIES/mL ESCHERICHIA COLI SUSCEPTIBILITIES TO FOLLOW Performed at [facility], [address], [place], [place] [address]    Report Status PENDING  Incomplete    Labs: CBC: Recent Labs  Lab 01/07/24 2134 01/07/24 2145 01/08/24 0424  WBC 3.4*  --  7.3  NEUTROABS 2.6  --   --   HGB 10.1* 11.6* 10.3*  HCT 32.1* 34.0* 32.2*  MCV 99.4  --  97.3  PLT 127*  --  115*   Basic Metabolic Panel: Recent Labs  Lab 01/07/24 2134 01/07/24 2145 01/08/24 0424  NA 140 144 140  K 2.9* 2.8* 4.1  CL 110 110 109  CO2 21*  --  20*  GLUCOSE 120* 119* 129*  BUN 18 20 19   CREATININE 1.07* 1.00 0.93  CALCIUM  8.5*  --  8.3*  MG  --   --  1.7  PHOS  --   --  3.8   Liver Function Tests: Recent Labs  Lab 01/07/24 2134  AST 20  ALT 11  ALKPHOS 58  BILITOT 0.6  PROT 6.7  ALBUMIN 3.2*   CBG: No results for input(s): GLUCAP in the last 168 hours.  Discharge time spent: {LESS THAN/GREATER UYJW:73611} 30 minutes.  Signed: Concepcion Riser, MD [facility] 01/09/2024

## 2024-01-09 NOTE — Progress Notes (Signed)
 IVT to bedside for PIV placement per consult request. Pt found to be sitting of EOB. Pt declining to have IV started, states she if leaving. Review of chart noting d/c orders in.  Attempt to redirect and assist pt back into bed refused by pt. Pt adamant she does not want this RN to assist her in any way.

## 2024-01-09 NOTE — Care Management Obs Status (Signed)
 MEDICARE OBSERVATION STATUS NOTIFICATION   Patient Details  Name: [name] MRN: [id number] Date of Birth: March 27, 1946   Medicare Observation Status Notification Given:  Yes After several accepts to contact patient by phone Went to the patient room and spoke with her from the hall. Copy given   Claretta Deed 01/09/2024, 3:22 PM

## 2024-01-10 DIAGNOSIS — E876 Hypokalemia: Secondary | ICD-10-CM | POA: Insufficient documentation

## 2024-01-10 LAB — URINE CULTURE: Culture: >=100000 — AB

## 2024-01-11 ENCOUNTER — Inpatient Hospital Stay (HOSPITAL_COMMUNITY)
Admission: EM | Admit: 2024-01-11 | Discharge: 2024-01-20 | DRG: 291 | Disposition: A | Discharge status: Home Health | Attending: Internal Medicine | Admitting: Internal Medicine

## 2024-01-11 ENCOUNTER — Other Ambulatory Visit: Payer: Self-pay

## 2024-01-11 ENCOUNTER — Encounter (HOSPITAL_COMMUNITY): Payer: Self-pay

## 2024-01-11 ENCOUNTER — Emergency Department (HOSPITAL_COMMUNITY)

## 2024-01-11 DIAGNOSIS — I82501 Chronic embolism and thrombosis of unspecified deep veins of right lower extremity: Secondary | ICD-10-CM | POA: Diagnosis present

## 2024-01-11 DIAGNOSIS — I110 Hypertensive heart disease with heart failure: Principal | ICD-10-CM | POA: Diagnosis present

## 2024-01-11 DIAGNOSIS — Z87891 Personal history of nicotine dependence: Secondary | ICD-10-CM

## 2024-01-11 DIAGNOSIS — Z91041 Radiographic dye allergy status: Secondary | ICD-10-CM

## 2024-01-11 DIAGNOSIS — R296 Repeated falls: Secondary | ICD-10-CM | POA: Diagnosis present

## 2024-01-11 DIAGNOSIS — R5381 Other malaise: Secondary | ICD-10-CM | POA: Diagnosis present

## 2024-01-11 DIAGNOSIS — J449 Chronic obstructive pulmonary disease, unspecified: Secondary | ICD-10-CM | POA: Diagnosis present

## 2024-01-11 DIAGNOSIS — Y636 Underdosing and nonadministration of necessary drug, medicament or biological substance: Secondary | ICD-10-CM | POA: Diagnosis present

## 2024-01-11 DIAGNOSIS — Z8673 Personal history of transient ischemic attack (TIA), and cerebral infarction without residual deficits: Secondary | ICD-10-CM

## 2024-01-11 DIAGNOSIS — E876 Hypokalemia: Secondary | ICD-10-CM | POA: Diagnosis present

## 2024-01-11 DIAGNOSIS — R7989 Other specified abnormal findings of blood chemistry: Secondary | ICD-10-CM | POA: Diagnosis present

## 2024-01-11 DIAGNOSIS — D696 Thrombocytopenia, unspecified: Secondary | ICD-10-CM | POA: Diagnosis present

## 2024-01-11 DIAGNOSIS — I716 Thoracoabdominal aortic aneurysm, without rupture, unspecified: Secondary | ICD-10-CM | POA: Diagnosis present

## 2024-01-11 DIAGNOSIS — I2489 Other forms of acute ischemic heart disease: Secondary | ICD-10-CM | POA: Diagnosis present

## 2024-01-11 DIAGNOSIS — Z79899 Other long term (current) drug therapy: Secondary | ICD-10-CM

## 2024-01-11 DIAGNOSIS — I10 Essential (primary) hypertension: Secondary | ICD-10-CM | POA: Diagnosis present

## 2024-01-11 DIAGNOSIS — Z1152 Encounter for screening for COVID-19: Secondary | ICD-10-CM

## 2024-01-11 DIAGNOSIS — I509 Heart failure, unspecified: Principal | ICD-10-CM

## 2024-01-11 DIAGNOSIS — R4189 Other symptoms and signs involving cognitive functions and awareness: Secondary | ICD-10-CM | POA: Diagnosis present

## 2024-01-11 DIAGNOSIS — K5900 Constipation, unspecified: Secondary | ICD-10-CM | POA: Diagnosis present

## 2024-01-11 DIAGNOSIS — D649 Anemia, unspecified: Secondary | ICD-10-CM | POA: Diagnosis present

## 2024-01-11 DIAGNOSIS — I5033 Acute on chronic diastolic (congestive) heart failure: Secondary | ICD-10-CM | POA: Diagnosis present

## 2024-01-11 DIAGNOSIS — Z86718 Personal history of other venous thrombosis and embolism: Secondary | ICD-10-CM

## 2024-01-11 DIAGNOSIS — Z8249 Family history of ischemic heart disease and other diseases of the circulatory system: Secondary | ICD-10-CM

## 2024-01-11 DIAGNOSIS — F419 Anxiety disorder, unspecified: Secondary | ICD-10-CM | POA: Diagnosis present

## 2024-01-11 DIAGNOSIS — E785 Hyperlipidemia, unspecified: Secondary | ICD-10-CM | POA: Diagnosis present

## 2024-01-11 DIAGNOSIS — J9601 Acute respiratory failure with hypoxia: Secondary | ICD-10-CM | POA: Diagnosis present

## 2024-01-11 LAB — COMPREHENSIVE METABOLIC PANEL WITH GFR
ALT: 8 U/L (ref 0–44)
AST: 23 U/L (ref 15–41)
Albumin: 3.8 g/dL (ref 3.5–5.0)
Alkaline Phosphatase: 70 U/L (ref 38–126)
Anion gap: 14 (ref 5–15)
BUN: 18 mg/dL (ref 8–23)
CO2: 21 mmol/L — ABNORMAL LOW (ref 22–32)
Calcium: 9.1 mg/dL (ref 8.9–10.3)
Chloride: 103 mmol/L (ref 98–111)
Creatinine, Ser: 1.09 mg/dL — ABNORMAL HIGH (ref 0.44–1.00)
GFR, Estimated: 51 mL/min — ABNORMAL LOW (ref >60)
Glucose, Bld: 92 mg/dL (ref 70–99)
Potassium: 3.4 mmol/L — ABNORMAL LOW (ref 3.5–5.1)
Sodium: 139 mmol/L (ref 135–145)
Total Bilirubin: 0.5 mg/dL (ref 0.0–1.2)
Total Protein: 7.2 g/dL (ref 6.5–8.1)

## 2024-01-11 LAB — CBC
HCT: 35.8 % — ABNORMAL LOW (ref 36.0–46.0)
Hemoglobin: 11.1 g/dL — ABNORMAL LOW (ref 12.0–15.0)
MCH: 29.7 pg (ref 26.0–34.0)
MCHC: 31 g/dL (ref 30.0–36.0)
MCV: 95.7 fL (ref 80.0–100.0)
Platelets: 111 K/uL — ABNORMAL LOW (ref 150–400)
RBC: 3.74 MIL/uL — ABNORMAL LOW (ref 3.87–5.11)
RDW: 14.2 % (ref 11.5–15.5)
WBC: 6.7 K/uL (ref 4.0–10.5)
nRBC: 0 % (ref 0.0–0.2)

## 2024-01-11 LAB — RESP PANEL BY RT-PCR (RSV, FLU A&B, COVID)  RVPGX2
Influenza A by PCR: NEGATIVE
Influenza B by PCR: NEGATIVE
Resp Syncytial Virus by PCR: NEGATIVE
SARS Coronavirus 2 by RT PCR: NEGATIVE

## 2024-01-11 LAB — CULTURE, BLOOD (ROUTINE X 2)

## 2024-01-11 MED ORDER — METHYLPREDNISOLONE SODIUM SUCC 125 MG IJ SOLR
125.0000 mg | Freq: Once | INTRAMUSCULAR | Status: AC
  Administered 2024-01-11: 125 mg via INTRAVENOUS
  Filled 2024-01-11: qty 2

## 2024-01-11 MED ORDER — IPRATROPIUM-ALBUTEROL 0.5-2.5 (3) MG/3ML IN SOLN
3.0000 mL | Freq: Once | RESPIRATORY_TRACT | Status: AC
  Administered 2024-01-11: 3 mL via RESPIRATORY_TRACT
  Filled 2024-01-11: qty 3

## 2024-01-11 NOTE — ED Triage Notes (Signed)
 Pt. Arrives for SOB x a few hours. Hx of COPD. Recently discharged from [facility] for the same. Hard for her to take a deep breath. Also had a fall yesterday. Endorses pain to the lower back. Denies chest pain.

## 2024-01-11 NOTE — ED Provider Notes (Signed)
 [place] EMERGENCY DEPARTMENT AT [facility] Provider Note   CSN: [id number] Arrival date & time: 01/11/24  2248     Patient presents with: Shortness of Breath   [name] is a 78 y.o. female.  {Add pertinent medical, surgical, social history, OB history to YEP:67052} Patient presents via EMS with shortness of breath for the past several hours.  Recent admission for COPD exacerbation discharged 2 days ago.  States she developed shortness of breath with difficulty breathing over the past several hours.  When asked about her home medications including her inhalers she states she has no medications at home and cannot explain why.  Unknown if she is taking the antibiotic she was discharged with for bronchitis.  Does not peer to be currently on steroids.  She denies chest pain.  Has a nonproductive cough and congestion.  No fever.  No leg pain or leg swelling.  No abdominal pain, nausea or vomiting.  She denies any blood thinner use.  She was discharged in the hospital 2 days ago with her home medications but cannot explain why she was not taking any prescriptions.  The history is provided by the patient and the EMS personnel.  Shortness of Breath Associated symptoms: no abdominal pain, no chest pain, no fever, no headaches, no rash and no vomiting        Prior to Admission medications   Medication Sig Start Date End Date Taking? Authorizing Provider  albuterol  (VENTOLIN  HFA) 108 (90 Base) MCG/ACT inhaler Inhale 2 puffs into the lungs every 6 (six) hours as needed for wheezing or shortness of breath. 01/09/24   Darci Pore, MD  amLODipine  (NORVASC ) 10 MG tablet Take 1 tablet (10 mg total) by mouth daily. 12/15/23   Rojelio Nest, DO  cefUROXime  (CEFTIN ) 500 MG tablet Take 1 tablet (500 mg total) by mouth 2 (two) times daily with a meal for 3 days. 01/09/24 01/12/24  Darci Pore, MD  hydrALAZINE  (APRESOLINE ) 50 MG tablet Take 1 tablet (50 mg total) by mouth every 8  (eight) hours. 12/15/23   Rojelio Nest, DO  pantoprazole  (PROTONIX ) 40 MG tablet Take 1 tablet (40 mg total) by mouth daily. 12/15/23   Rojelio Nest, DO  polyethylene glycol (MIRALAX  / GLYCOLAX ) 17 g packet Take 17 g by mouth daily. 12/15/23   Rojelio Nest, DO  rosuvastatin  (CRESTOR ) 20 MG tablet TAKE 1 TABLET(20 MG) BY MOUTH DAILY Patient taking differently: Take 20 mg by mouth daily. 08/23/23   Lorren Greig PARAS, NP    Allergies: Iodinated contrast media    Review of Systems  Constitutional:  Negative for activity change, appetite change and fever.  HENT:  Negative for congestion and rhinorrhea.   Respiratory:  Positive for chest tightness and shortness of breath.   Cardiovascular:  Negative for chest pain.  Gastrointestinal:  Negative for abdominal pain, nausea and vomiting.  Genitourinary:  Negative for dysuria and hematuria.  Musculoskeletal:  Negative for arthralgias and myalgias.  Skin:  Negative for rash.  Neurological:  Negative for dizziness, weakness and headaches.   all other systems are negative except as noted in the HPI and PMH.    Updated Vital Signs BP (!) 149/84   Pulse 72   Temp 97.6 F (36.4 C) (Oral)   Resp (!) 26   SpO2 100%   Physical Exam Vitals and nursing note reviewed.  Constitutional:      General: She is in acute distress.     Appearance: She is well-developed. She is not  ill-appearing.     Comments: Dyspneic with conversation  HENT:     Head: Normocephalic and atraumatic.     Mouth/Throat:     Pharynx: No oropharyngeal exudate.  Eyes:     Conjunctiva/sclera: Conjunctivae normal.     Pupils: Pupils are equal, round, and reactive to light.  Neck:     Comments: No meningismus. Cardiovascular:     Rate and Rhythm: Normal rate and regular rhythm.     Heart sounds: Normal heart sounds. No murmur heard. Pulmonary:     Effort: Pulmonary effort is normal. No respiratory distress.     Breath sounds: Wheezing present.     Comments: Diminished bases  with expiratory wheezing Abdominal:     Palpations: Abdomen is soft.     Tenderness: There is no abdominal tenderness. There is no guarding or rebound.  Musculoskeletal:        General: No tenderness. Normal range of motion.     Cervical back: Normal range of motion and neck supple.  Skin:    General: Skin is warm.  Neurological:     Mental Status: She is alert and oriented to person, place, and time.     Cranial Nerves: No cranial nerve deficit.     Motor: No abnormal muscle tone.     Coordination: Coordination normal.     Comments:  5/5 strength throughout. CN 2-12 intact.Equal grip strength.   Psychiatric:        Behavior: Behavior normal.     (all labs ordered are listed, but only abnormal results are displayed) Labs Reviewed  RESP PANEL BY RT-PCR (RSV, FLU A&B, COVID)  [id number]  CBC  COMPREHENSIVE METABOLIC PANEL WITH GFR  PRO BRAIN NATRIURETIC PEPTIDE  D-DIMER, QUANTITATIVE  TROPONIN T, HIGH SENSITIVITY    EKG: EKG Interpretation Date/Time:  Thursday January 11 2024 23:03:53 EDT Ventricular Rate:  69 PR Interval:  150 QRS Duration:  108 QT Interval:  455 QTC Calculation: 488 R Axis:   43  Text Interpretation: Sinus rhythm Probable left ventricular hypertrophy with repolarization abnormality similar in comparison to prior from 31?"AUG?"2025 Borderline prolonged QT interval  No significant change since last tracing  Confirmed by Rogelia Satterfield ([id number]) on 01/11/2024 11:07:31 PM  Radiology: No results found.  {Document cardiac monitor, telemetry assessment procedure when appropriate:32947} Procedures   Medications Ordered in the ED  ipratropium-albuterol  (DUONEB) 0.5-2.5 (3) MG/3ML nebulizer solution 3 mL (has no administration in time range)  methylPREDNISolone  sodium succinate (SOLU-MEDROL ) 125 mg/2 mL injection 125 mg (has no administration in time range)      {Click here for ABCD2, HEART and other calculators REFRESH Note before signing:1}                               Medical Decision Making Amount and/or Complexity of Data Reviewed Labs: ordered. Decision-making details documented in ED Course. Radiology: ordered and independent interpretation performed. Decision-making details documented in ED Course. ECG/medicine tests: ordered and independent interpretation performed. Decision-making details documented in ED Course.  Risk Prescription drug management.   Patient with recent admission for COPD exacerbation and bronchitis here with increased work of breathing and shortness of breath.  She is tachypneic but not hypoxic on room air.  Diminished breath sounds bilaterally.  EKG similar to previous with subtle ST depressions inferior laterally.  She denies chest pain.  {Document critical care time when appropriate  Document review of labs and clinical decision tools ie CHADS2VASC2,  etc  Document your independent review of radiology images and any outside records  Document your discussion with family members, caretakers and with consultants  Document social determinants of health affecting pt's care  Document your decision making why or why not admission, treatments were needed:32947:::1}   Final diagnoses:  None    ED Discharge Orders     None

## 2024-01-12 ENCOUNTER — Encounter (HOSPITAL_COMMUNITY): Payer: Self-pay | Admitting: Family Medicine

## 2024-01-12 ENCOUNTER — Inpatient Hospital Stay (HOSPITAL_COMMUNITY)

## 2024-01-12 DIAGNOSIS — M7989 Other specified soft tissue disorders: Secondary | ICD-10-CM | POA: Diagnosis not present

## 2024-01-12 DIAGNOSIS — E876 Hypokalemia: Secondary | ICD-10-CM

## 2024-01-12 DIAGNOSIS — I5033 Acute on chronic diastolic (congestive) heart failure: Secondary | ICD-10-CM

## 2024-01-12 DIAGNOSIS — Z8249 Family history of ischemic heart disease and other diseases of the circulatory system: Secondary | ICD-10-CM | POA: Diagnosis not present

## 2024-01-12 DIAGNOSIS — Z91041 Radiographic dye allergy status: Secondary | ICD-10-CM | POA: Diagnosis not present

## 2024-01-12 DIAGNOSIS — Z8673 Personal history of transient ischemic attack (TIA), and cerebral infarction without residual deficits: Secondary | ICD-10-CM | POA: Diagnosis not present

## 2024-01-12 DIAGNOSIS — I110 Hypertensive heart disease with heart failure: Secondary | ICD-10-CM | POA: Diagnosis present

## 2024-01-12 DIAGNOSIS — Z87891 Personal history of nicotine dependence: Secondary | ICD-10-CM | POA: Diagnosis not present

## 2024-01-12 DIAGNOSIS — E785 Hyperlipidemia, unspecified: Secondary | ICD-10-CM | POA: Diagnosis present

## 2024-01-12 DIAGNOSIS — J449 Chronic obstructive pulmonary disease, unspecified: Secondary | ICD-10-CM | POA: Diagnosis present

## 2024-01-12 DIAGNOSIS — Z1152 Encounter for screening for COVID-19: Secondary | ICD-10-CM | POA: Diagnosis not present

## 2024-01-12 DIAGNOSIS — R5381 Other malaise: Secondary | ICD-10-CM | POA: Diagnosis present

## 2024-01-12 DIAGNOSIS — R296 Repeated falls: Secondary | ICD-10-CM | POA: Diagnosis present

## 2024-01-12 DIAGNOSIS — D696 Thrombocytopenia, unspecified: Secondary | ICD-10-CM | POA: Diagnosis present

## 2024-01-12 DIAGNOSIS — Z008 Encounter for other general examination: Secondary | ICD-10-CM | POA: Diagnosis present

## 2024-01-12 DIAGNOSIS — Z79899 Other long term (current) drug therapy: Secondary | ICD-10-CM | POA: Diagnosis not present

## 2024-01-12 DIAGNOSIS — F419 Anxiety disorder, unspecified: Secondary | ICD-10-CM | POA: Diagnosis present

## 2024-01-12 DIAGNOSIS — Z86718 Personal history of other venous thrombosis and embolism: Secondary | ICD-10-CM

## 2024-01-12 DIAGNOSIS — D649 Anemia, unspecified: Secondary | ICD-10-CM | POA: Diagnosis present

## 2024-01-12 DIAGNOSIS — Y636 Underdosing and nonadministration of necessary drug, medicament or biological substance: Secondary | ICD-10-CM | POA: Diagnosis present

## 2024-01-12 DIAGNOSIS — R7989 Other specified abnormal findings of blood chemistry: Secondary | ICD-10-CM

## 2024-01-12 DIAGNOSIS — I10 Essential (primary) hypertension: Secondary | ICD-10-CM

## 2024-01-12 DIAGNOSIS — J9601 Acute respiratory failure with hypoxia: Secondary | ICD-10-CM | POA: Diagnosis present

## 2024-01-12 DIAGNOSIS — R4189 Other symptoms and signs involving cognitive functions and awareness: Secondary | ICD-10-CM | POA: Diagnosis present

## 2024-01-12 DIAGNOSIS — I716 Thoracoabdominal aortic aneurysm, without rupture, unspecified: Secondary | ICD-10-CM | POA: Diagnosis present

## 2024-01-12 DIAGNOSIS — I2489 Other forms of acute ischemic heart disease: Secondary | ICD-10-CM | POA: Diagnosis present

## 2024-01-12 DIAGNOSIS — K5900 Constipation, unspecified: Secondary | ICD-10-CM | POA: Diagnosis present

## 2024-01-12 DIAGNOSIS — I82501 Chronic embolism and thrombosis of unspecified deep veins of right lower extremity: Secondary | ICD-10-CM | POA: Diagnosis present

## 2024-01-12 LAB — URINALYSIS, W/ REFLEX TO CULTURE (INFECTION SUSPECTED)
Bilirubin Urine: NEGATIVE
Glucose, UA: NEGATIVE mg/dL
Ketones, ur: NEGATIVE mg/dL
Nitrite: NEGATIVE
Protein, ur: 30 mg/dL — AB
Specific Gravity, Urine: 1.011 (ref 1.005–1.030)
pH: 5 (ref 5.0–8.0)

## 2024-01-12 LAB — BASIC METABOLIC PANEL WITH GFR
Anion gap: 16 — ABNORMAL HIGH (ref 5–15)
BUN: 17 mg/dL (ref 8–23)
CO2: 21 mmol/L — ABNORMAL LOW (ref 22–32)
Calcium: 9 mg/dL (ref 8.9–10.3)
Chloride: 103 mmol/L (ref 98–111)
Creatinine, Ser: 0.91 mg/dL (ref 0.44–1.00)
GFR, Estimated: >60 mL/min (ref >60)
Glucose, Bld: 138 mg/dL — ABNORMAL HIGH (ref 70–99)
Potassium: 3.2 mmol/L — ABNORMAL LOW (ref 3.5–5.1)
Sodium: 140 mmol/L (ref 135–145)

## 2024-01-12 LAB — D-DIMER, QUANTITATIVE: D-Dimer, Quant: >20 ug{FEU}/mL — ABNORMAL HIGH (ref 0.00–0.50)

## 2024-01-12 LAB — CBC
HCT: 34.8 % — ABNORMAL LOW (ref 36.0–46.0)
Hemoglobin: 10.9 g/dL — ABNORMAL LOW (ref 12.0–15.0)
MCH: 30.1 pg (ref 26.0–34.0)
MCHC: 31.3 g/dL (ref 30.0–36.0)
MCV: 96.1 fL (ref 80.0–100.0)
Platelets: 113 K/uL — ABNORMAL LOW (ref 150–400)
RBC: 3.62 MIL/uL — ABNORMAL LOW (ref 3.87–5.11)
RDW: 14.2 % (ref 11.5–15.5)
WBC: 6 K/uL (ref 4.0–10.5)
nRBC: 0 % (ref 0.0–0.2)

## 2024-01-12 LAB — ECHOCARDIOGRAM COMPLETE
Area-P 1/2: 2.04 cm2
S' Lateral: 4.4 cm

## 2024-01-12 LAB — TROPONIN T, HIGH SENSITIVITY
Troponin T High Sensitivity: 41 ng/L — ABNORMAL HIGH (ref 0–19)
Troponin T High Sensitivity: 38 ng/L — ABNORMAL HIGH (ref 0–19)

## 2024-01-12 LAB — MAGNESIUM: Magnesium: 2.2 mg/dL (ref 1.7–2.4)

## 2024-01-12 LAB — PRO BRAIN NATRIURETIC PEPTIDE: Pro Brain Natriuretic Peptide: 3648 pg/mL — ABNORMAL HIGH (ref <300.0)

## 2024-01-12 MED ORDER — PERFLUTREN LIPID MICROSPHERE
1.0000 mL | INTRAVENOUS | Status: AC | PRN
  Administered 2024-01-12: 3 mL via INTRAVENOUS

## 2024-01-12 MED ORDER — PROCHLORPERAZINE EDISYLATE 10 MG/2ML IJ SOLN: 5.0000 mg | Freq: Four times a day (QID) | INTRAMUSCULAR | Status: DC | PRN

## 2024-01-12 MED ORDER — IOHEXOL 350 MG/ML SOLN
75.0000 mL | Freq: Once | INTRAVENOUS | Status: AC | PRN
  Administered 2024-01-12: 75 mL via INTRAVENOUS

## 2024-01-12 MED ORDER — ACETAMINOPHEN 325 MG PO TABS
650.0000 mg | ORAL_TABLET | Freq: Four times a day (QID) | ORAL | Status: DC | PRN
  Administered 2024-01-14: 650 mg via ORAL
  Filled 2024-01-12: qty 2

## 2024-01-12 MED ORDER — POTASSIUM CHLORIDE 10 MEQ/100ML IV SOLN
10.0000 meq | INTRAVENOUS | Status: AC
  Administered 2024-01-12 (×3): 10 meq via INTRAVENOUS
  Filled 2024-01-12 (×2): qty 100

## 2024-01-12 MED ORDER — HYDRALAZINE HCL 25 MG PO TABS
50.0000 mg | ORAL_TABLET | Freq: Three times a day (TID) | ORAL | Status: DC
  Administered 2024-01-12 – 2024-01-20 (×25): 50 mg via ORAL
  Filled 2024-01-12 (×26): qty 2

## 2024-01-12 MED ORDER — POTASSIUM CHLORIDE CRYS ER 20 MEQ PO TBCR
20.0000 meq | EXTENDED_RELEASE_TABLET | Freq: Once | ORAL | Status: AC
Start: 2024-01-12 — End: 2024-01-12
  Administered 2024-01-12: 20 meq via ORAL
  Filled 2024-01-12: qty 1

## 2024-01-12 MED ORDER — DIPHENHYDRAMINE HCL 50 MG/ML IJ SOLN
50.0000 mg | Freq: Once | INTRAMUSCULAR | Status: AC
Start: 2024-01-12 — End: 2024-01-12
  Administered 2024-01-12: 50 mg via INTRAVENOUS
  Filled 2024-01-12: qty 1

## 2024-01-12 MED ORDER — FUROSEMIDE 10 MG/ML IJ SOLN
40.0000 mg | Freq: Two times a day (BID) | INTRAMUSCULAR | Status: DC
  Administered 2024-01-12 – 2024-01-15 (×8): 40 mg via INTRAVENOUS
  Filled 2024-01-12 (×8): qty 4

## 2024-01-12 MED ORDER — HEPARIN SODIUM (PORCINE) 5000 UNIT/ML IJ SOLN
5000.0000 [IU] | Freq: Three times a day (TID) | INTRAMUSCULAR | Status: DC
  Administered 2024-01-12 – 2024-01-20 (×26): 5000 [IU] via SUBCUTANEOUS
  Filled 2024-01-12 (×26): qty 1

## 2024-01-12 MED ORDER — METHYLPREDNISOLONE SODIUM SUCC 40 MG IJ SOLR
40.0000 mg | Freq: Once | INTRAMUSCULAR | Status: DC
  Filled 2024-01-12: qty 1

## 2024-01-12 MED ORDER — ACETAMINOPHEN 650 MG RE SUPP: 650.0000 mg | Freq: Four times a day (QID) | RECTAL | Status: DC | PRN

## 2024-01-12 MED ORDER — POTASSIUM CHLORIDE CRYS ER 20 MEQ PO TBCR
40.0000 meq | EXTENDED_RELEASE_TABLET | ORAL | Status: AC
  Administered 2024-01-12 (×2): 40 meq via ORAL
  Filled 2024-01-12 (×2): qty 2

## 2024-01-12 MED ORDER — MELATONIN 5 MG PO TABS
5.0000 mg | ORAL_TABLET | Freq: Every evening | ORAL | Status: AC | PRN
  Administered 2024-01-12 – 2024-01-16 (×3): 5 mg via ORAL
  Filled 2024-01-12 (×3): qty 1

## 2024-01-12 MED ORDER — PANTOPRAZOLE SODIUM 40 MG PO TBEC
40.0000 mg | DELAYED_RELEASE_TABLET | Freq: Every day | ORAL | Status: DC
  Administered 2024-01-12 – 2024-01-20 (×9): 40 mg via ORAL
  Filled 2024-01-12 (×9): qty 1

## 2024-01-12 MED ORDER — ROSUVASTATIN CALCIUM 20 MG PO TABS
20.0000 mg | ORAL_TABLET | Freq: Every day | ORAL | Status: DC
  Administered 2024-01-12 – 2024-01-20 (×9): 20 mg via ORAL
  Filled 2024-01-12 (×9): qty 1

## 2024-01-12 MED ORDER — FUROSEMIDE 10 MG/ML IJ SOLN
20.0000 mg | Freq: Once | INTRAMUSCULAR | Status: AC
  Administered 2024-01-12: 20 mg via INTRAVENOUS
  Filled 2024-01-12: qty 4

## 2024-01-12 MED ORDER — SODIUM CHLORIDE 0.9% FLUSH
3.0000 mL | Freq: Two times a day (BID) | INTRAVENOUS | Status: DC
  Administered 2024-01-12 – 2024-01-19 (×15): 3 mL via INTRAVENOUS

## 2024-01-12 MED ORDER — AMLODIPINE BESYLATE 5 MG PO TABS
10.0000 mg | ORAL_TABLET | Freq: Every day | ORAL | Status: DC
  Administered 2024-01-12 – 2024-01-20 (×9): 10 mg via ORAL
  Filled 2024-01-12 (×9): qty 2

## 2024-01-12 MED ORDER — DIPHENHYDRAMINE HCL 25 MG PO CAPS: 50.0000 mg | ORAL_CAPSULE | Freq: Once | ORAL | Status: AC

## 2024-01-12 MED ORDER — SENNOSIDES-DOCUSATE SODIUM 8.6-50 MG PO TABS
1.0000 | ORAL_TABLET | Freq: Every evening | ORAL | Status: DC | PRN
  Administered 2024-01-14: 1 via ORAL
  Filled 2024-01-12: qty 1

## 2024-01-12 NOTE — Progress Notes (Signed)
 PT Cancellation Note  Patient Details Name: [name] MRN: [id number] DOB: 1945/08/25   Cancelled Treatment:    Reason Eval/Treat Not Completed: Other (comment);Medical issues which prohibited therapy; hold PT per MD for today, dopplers pending.     Stanley Lyness 01/12/2024, 12:38 PM

## 2024-01-12 NOTE — H&P (Addendum)
 History and Physical    [name] FMW:[id number] DOB: [birth year] DOA: 01/11/2024  PCP: Patient, No Pcp Per   Patient coming from: Home   Chief Complaint: Cough, SOB   HPI: [name] is a 78 y.o. 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  Patient reports that she has not taken any of her medications since she was discharged and has developed recurrent shortness of breath over the past day.  She has not been coughing much and is not producing sputum.  She denies any chest pain associated with this.  She also denies fevers, chills, dysuria, or flank pain.  She was admitted to the hospital from 01/07/2024 until 01/09/2024 with respiratory failure suspected secondary to viral URI, E. coli UTI, frequent falls, and hypotension due to hypovolemia.  She was treated with Rocephin, IV fluids, and supplemental oxygen, refused SNF, and went home with cefdinir.  She did not require supplemental oxygen at time of discharge.  ED Course: Upon arrival to the ED, patient is found to be afebrile and saturating low 90s on room air with mild tachypnea, normal HR, and stable BP.  Labs are most notable for potassium 3.4, creatinine 1.09, normal WBC, platelets 111,000, D-dimer >20, proBNP 3648, and troponin 41.  Chest x-ray demonstrates persistent airspace opacity in the left lung.  CTA chest was ordered but requires pretreatment for history of contrast allergy.  Patient was given IV Lasix , IV Solu-Medrol , DuoNeb, and Benadryl  in the ED.  Review of Systems:  All other systems reviewed and apart from HPI, are negative.  Past Medical History:  Diagnosis Date   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 (HCC) 09/16/2020    COPD (chronic obstructive pulmonary disease) (HCC)    History of DVT (deep vein thrombosis)    Hypertension    Thoracoabdominal aortic aneurysm (HCC)    TIA (transient ischemic attack) 09/10/2020    Past Surgical History:  Procedure Laterality Date   IR BONE MARROW BIOPSY & ASPIRATION  12/13/2023   IR CT HEAD LTD  09/11/2020   IR INTRAVSC STENT CERV CAROTID W/O EMB-PROT MOD SED INC ANGIO  09/11/2020   IR PERCUTANEOUS ART THROMBECTOMY/INFUSION INTRACRANIAL INC DIAG ANGIO  09/11/2020       IR PERCUTANEOUS ART THROMBECTOMY/INFUSION INTRACRANIAL INC DIAG ANGIO  09/11/2020   IR US  GUIDE VASC ACCESS RIGHT  09/11/2020   NO PAST SURGERIES     RADIOLOGY WITH ANESTHESIA N/A 09/11/2020   Procedure: IR WITH ANESTHESIA;  Surgeon: Dolphus Carrion, MD;  Location: [facility] OR;  Service: Radiology;  Laterality: N/A;    Social History:   reports that she quit smoking about 36 years ago. Her smoking use included cigarettes. She has never used smokeless tobacco. She reports that she does not currently use alcohol. She reports that she does not use drugs.  Allergies  Allergen Reactions   Iodinated Contrast Media     9/1 developed diffuse itching after contrast dye for CT (without rash or airway involvement or GI symptoms) . Consider pretreatment before IV contrast in the future     Family History  Problem Relation Age of Onset   Hypertension Mother      Prior to Admission medications   Medication Sig Start Date End Date Taking? Authorizing Provider  albuterol  (  VENTOLIN  HFA) 108 (90 Base) MCG/ACT inhaler Inhale 2 puffs into the lungs every 6 (six) hours as needed for wheezing or shortness of breath. 01/09/24   Darci Pore, MD  amLODipine  (NORVASC ) 10 MG tablet Take 1 tablet (10 mg total) by mouth daily. 12/15/23   Rojelio Nest, DO  cefUROXime  (CEFTIN ) 500 MG tablet Take 1 tablet (500 mg total) by mouth 2 (two) times daily with a meal for 3 days. 01/09/24 01/12/24  Darci Pore, MD  hydrALAZINE   (APRESOLINE ) 50 MG tablet Take 1 tablet (50 mg total) by mouth every 8 (eight) hours. 12/15/23   Rojelio Nest, DO  pantoprazole  (PROTONIX ) 40 MG tablet Take 1 tablet (40 mg total) by mouth daily. 12/15/23   Rojelio Nest, DO  polyethylene glycol (MIRALAX  / GLYCOLAX ) 17 g packet Take 17 g by mouth daily. 12/15/23   Rojelio Nest, DO  rosuvastatin  (CRESTOR ) 20 MG tablet TAKE 1 TABLET(20 MG) BY MOUTH DAILY Patient taking differently: Take 20 mg by mouth daily. 08/23/23   Lorren Greig PARAS, NP    Physical Exam: Vitals:   01/11/24 2255 01/12/24 0015  BP: (!) 149/84 (!) 110/93  Pulse: 72 63  Resp: (!) 26 19  Temp: 97.6 F (36.4 C)   TempSrc: Oral   SpO2: 100% 94%    Constitutional: NAD, no pallor or diaphoresis   Eyes: PERTLA, lids and conjunctivae normal ENMT: Mucous membranes are moist. Posterior pharynx clear of any exudate or lesions.   Neck: supple, no masses  Respiratory: no wheezing. No accessory muscle use.  Cardiovascular: S1 & S2 heard, regular rate and rhythm. Mild bilateral lower extremity edema.  Abdomen: No tenderness, soft. Bowel sounds active.  Musculoskeletal: no clubbing / cyanosis. No joint deformity upper and lower extremities.   Skin: no significant rashes, lesions, ulcers. Warm, dry, well-perfused. Neurologic: CN 2-12 grossly intact. Mild dysarthria. Moving all extremities. Alert and oriented to person, place, and situation.  Psychiatric: Calm. Cooperative.    Labs and Imaging on Admission: I have personally reviewed following labs and imaging studies  CBC: Recent Labs  Lab 01/07/24 2134 01/07/24 2145 01/08/24 0424 01/11/24 2300  WBC 3.4*  --  7.3 6.7  NEUTROABS 2.6  --   --   --   HGB 10.1* 11.6* 10.3* 11.1*  HCT 32.1* 34.0* 32.2* 35.8*  MCV 99.4  --  97.3 95.7  PLT 127*  --  115* 111*   Basic Metabolic Panel: Recent Labs  Lab 01/07/24 2134 01/07/24 2145 01/08/24 0424 01/11/24 2300  NA 140 144 140 139  K 2.9* 2.8* 4.1 3.4*  CL 110 110 109 103  CO2  21*  --  20* 21*  GLUCOSE 120* 119* 129* 92  BUN 18 20 19 18   CREATININE 1.07* 1.00 0.93 1.09*  CALCIUM  8.5*  --  8.3* 9.1  MG  --   --  1.7  --   PHOS  --   --  3.8  --    GFR: Estimated Creatinine Clearance: 51.5 mL/min (A) (by C-G formula based on SCr of 1.09 mg/dL (H)). Liver Function Tests: Recent Labs  Lab 01/07/24 2134 01/11/24 2300  AST 20 23  ALT 11 8  ALKPHOS 58 70  BILITOT 0.6 0.5  PROT 6.7 7.2  ALBUMIN 3.2* 3.8   No results for input(s): LIPASE, AMYLASE in the last 168 hours. No results for input(s): AMMONIA in the last 168 hours. Coagulation Profile: No results for input(s): INR, PROTIME in the last 168 hours. Cardiac Enzymes: No results  for input(s): CKTOTAL, CKMB, CKMBINDEX, TROPONINI in the last 168 hours. BNP (last 3 results) Recent Labs    01/11/24 2300  PROBNP 3,648.0*   HbA1C: No results for input(s): HGBA1C in the last 72 hours. CBG: No results for input(s): GLUCAP in the last 168 hours. Lipid Profile: No results for input(s): CHOL, HDL, LDLCALC, TRIG, CHOLHDL, LDLDIRECT in the last 72 hours. Thyroid Function Tests: No results for input(s): TSH, T4TOTAL, FREET4, T3FREE, THYROIDAB in the last 72 hours. Anemia Panel: No results for input(s): VITAMINB12, FOLATE, FERRITIN, TIBC, IRON, RETICCTPCT in the last 72 hours. Urine analysis:    Component Value Date/Time   COLORURINE YELLOW 01/08/2024 0419   APPEARANCEUR HAZY (A) 01/08/2024 0419   LABSPEC >1.046 (H) 01/08/2024 0419   PHURINE 5.0 01/08/2024 0419   GLUCOSEU NEGATIVE 01/08/2024 0419   HGBUR MODERATE (A) 01/08/2024 0419   BILIRUBINUR NEGATIVE 01/08/2024 0419   KETONESUR NEGATIVE 01/08/2024 0419   PROTEINUR >=300 (A) 01/08/2024 0419   UROBILINOGEN 1.0 11/15/2010 2015   NITRITE POSITIVE (A) 01/08/2024 0419   LEUKOCYTESUR SMALL (A) 01/08/2024 0419   Sepsis Labs: @LABRCNTIP (procalcitonin:4,lacticidven:4) ) Recent Results (from the  past 240 hours)  Culture, blood (Routine X 2) w Reflex to ID Panel     Status: Abnormal   Collection Time: 01/07/24  9:30 PM   Specimen: BLOOD RIGHT ARM  Result Value Ref Range Status   Specimen Description BLOOD RIGHT ARM  Final   Special Requests   Final    BOTTLES DRAWN AEROBIC AND ANAEROBIC Blood Culture results may not be optimal due to an inadequate volume of blood received in culture bottles   Culture  Setup Time   Final    GRAM POSITIVE RODS AEROBIC BOTTLE ONLY CRITICAL VALUE NOTED.  VALUE IS CONSISTENT WITH PREVIOUSLY REPORTED AND CALLED VALUE.    Culture (A)  Final    CORYNEBACTERIUM, GROUP JK Standardized susceptibility testing for this organism is not available. Performed at [facility], [address] [place], [place] [address]    Report Status 01/11/2024 FINAL  Final  Resp panel by RT-PCR (RSV, Flu A&B, Covid) Anterior Nasal Swab     Status: None   Collection Time: 01/07/24  9:31 PM   Specimen: Anterior Nasal Swab  Result Value Ref Range Status   SARS Coronavirus 2 by RT PCR NEGATIVE NEGATIVE Final   Influenza A by PCR NEGATIVE NEGATIVE Final   Influenza B by PCR NEGATIVE NEGATIVE Final    Comment: (NOTE) The Xpert Xpress SARS-CoV-2/FLU/RSV plus assay is intended as an aid in the diagnosis of influenza from Nasopharyngeal swab specimens and should not be used as a sole basis for treatment. Nasal washings and aspirates are unacceptable for Xpert Xpress SARS-CoV-2/FLU/RSV testing.  Fact Sheet for Patients: BloggerCourse.com  Fact Sheet for Healthcare Providers: SeriousBroker.it  This test is not yet approved or cleared by the [place]  FDA and has been authorized for detection and/or diagnosis of SARS-CoV-2 by FDA under an Emergency Use Authorization (EUA). This EUA will remain in effect (meaning this test can be used) for the duration of the COVID-19 declaration under Section 564(b)(1) of the Act, 21  U.S.C. section 360bbb-3(b)(1), unless the authorization is terminated or revoked.     Resp Syncytial Virus by PCR NEGATIVE NEGATIVE Final    Comment: (NOTE) Fact Sheet for Patients: BloggerCourse.com  Fact Sheet for Healthcare Providers: SeriousBroker.it  This test is not yet approved or cleared by the [place]  FDA and has been authorized for detection and/or  diagnosis of SARS-CoV-2 by FDA under an Emergency Use Authorization (EUA). This EUA will remain in effect (meaning this test can be used) for the duration of the COVID-19 declaration under Section 564(b)(1) of the Act, 21 U.S.C. section 360bbb-3(b)(1), unless the authorization is terminated or revoked.  Performed at [facility], [address] [place], [place] [address]   Respiratory (~20 pathogens) panel by PCR     Status: None   Collection Time: 01/07/24  9:31 PM   Specimen: Nasopharyngeal Swab; Respiratory  Result Value Ref Range Status   Adenovirus NOT DETECTED NOT DETECTED Final   Coronavirus 229E NOT DETECTED NOT DETECTED Final    Comment: (NOTE) The Coronavirus on the Respiratory Panel, DOES NOT test for the novel  Coronavirus (2019 nCoV)    Coronavirus HKU1 NOT DETECTED NOT DETECTED Final   Coronavirus NL63 NOT DETECTED NOT DETECTED Final   Coronavirus OC43 NOT DETECTED NOT DETECTED Final   Metapneumovirus NOT DETECTED NOT DETECTED Final   Rhinovirus / Enterovirus NOT DETECTED NOT DETECTED Final   Influenza A NOT DETECTED NOT DETECTED Final   Influenza B NOT DETECTED NOT DETECTED Final   Parainfluenza Virus 1 NOT DETECTED NOT DETECTED Final   Parainfluenza Virus 2 NOT DETECTED NOT DETECTED Final   Parainfluenza Virus 3 NOT DETECTED NOT DETECTED Final   Parainfluenza Virus 4 NOT DETECTED NOT DETECTED Final   Respiratory Syncytial Virus NOT DETECTED NOT DETECTED Final   Bordetella pertussis NOT DETECTED NOT DETECTED Final   Bordetella Parapertussis  NOT DETECTED NOT DETECTED Final   Chlamydophila pneumoniae NOT DETECTED NOT DETECTED Final   Mycoplasma pneumoniae NOT DETECTED NOT DETECTED Final    Comment: Performed at [facility], [address] [place], [place] [address]  Culture, blood (Routine X 2) w Reflex to ID Panel     Status: Abnormal   Collection Time: 01/07/24  9:34 PM   Specimen: BLOOD RIGHT ARM  Result Value Ref Range Status   Specimen Description BLOOD RIGHT ARM  Final   Special Requests   Final    BOTTLES DRAWN AEROBIC AND ANAEROBIC Blood Culture results may not be optimal due to an inadequate volume of blood received in culture bottles   Culture  Setup Time   Final    GRAM POSITIVE COCCI GRAM POSITIVE RODS BOTTLES DRAWN AEROBIC ONLY CRITICAL RESULT CALLED TO, READ BACK BY AND VERIFIED WITH: PHARMD DOROTHA BARLOW [id number] @ 2128 FH    Culture (A)  Final    AEROCOCCUS VIRIDANS BACILLUS SPECIES Standardized susceptibility testing for this organism is not available. Performed at [facility], [address], [place], [place] [address]    Report Status 01/11/2024 FINAL  Final  Blood Culture ID Panel (Reflexed)     Status: None   Collection Time: 01/07/24  9:34 PM  Result Value Ref Range Status   Enterococcus faecalis NOT DETECTED NOT DETECTED Final   Enterococcus Faecium NOT DETECTED NOT DETECTED Final   Listeria monocytogenes NOT DETECTED NOT DETECTED Final   Staphylococcus species NOT DETECTED NOT DETECTED Final   Staphylococcus aureus (BCID) NOT DETECTED NOT DETECTED Final   Staphylococcus epidermidis NOT DETECTED NOT DETECTED Final   Staphylococcus lugdunensis NOT DETECTED NOT DETECTED Final   Streptococcus species NOT DETECTED NOT DETECTED Final   Streptococcus agalactiae NOT DETECTED NOT DETECTED Final   Streptococcus pneumoniae NOT DETECTED NOT DETECTED Final   Streptococcus pyogenes NOT DETECTED NOT DETECTED Final   A.calcoaceticus-baumannii NOT DETECTED NOT DETECTED Final  Bacteroides fragilis  NOT DETECTED NOT DETECTED Final   Enterobacterales NOT DETECTED NOT DETECTED Final   Enterobacter cloacae complex NOT DETECTED NOT DETECTED Final   Escherichia coli NOT DETECTED NOT DETECTED Final   Klebsiella aerogenes NOT DETECTED NOT DETECTED Final   Klebsiella oxytoca NOT DETECTED NOT DETECTED Final   Klebsiella pneumoniae NOT DETECTED NOT DETECTED Final   Proteus species NOT DETECTED NOT DETECTED Final   Salmonella species NOT DETECTED NOT DETECTED Final   Serratia marcescens NOT DETECTED NOT DETECTED Final   Haemophilus influenzae NOT DETECTED NOT DETECTED Final   Neisseria meningitidis NOT DETECTED NOT DETECTED Final   Pseudomonas aeruginosa NOT DETECTED NOT DETECTED Final   Stenotrophomonas maltophilia NOT DETECTED NOT DETECTED Final   Candida albicans NOT DETECTED NOT DETECTED Final   Candida auris NOT DETECTED NOT DETECTED Final   Candida glabrata NOT DETECTED NOT DETECTED Final   Candida krusei NOT DETECTED NOT DETECTED Final   Candida parapsilosis NOT DETECTED NOT DETECTED Final   Candida tropicalis NOT DETECTED NOT DETECTED Final   Cryptococcus neoformans/gattii NOT DETECTED NOT DETECTED Final    Comment: Performed at [facility], [address] [place], [place] [address]  Urine Culture     Status: Abnormal   Collection Time: 01/08/24  4:19 AM   Specimen: Urine, Random  Result Value Ref Range Status   Specimen Description URINE, RANDOM  Final   Special Requests   Final    NONE Reflexed from [id number] Performed at [facility], [address] [place], [place] [address]    Culture >=100,000 COLONIES/mL ESCHERICHIA COLI (A)  Final   Report Status 01/10/2024 FINAL  Final   Organism ID, Bacteria ESCHERICHIA COLI (A)  Final      Susceptibility   Escherichia coli - MIC*    AMPICILLIN 8 SENSITIVE Sensitive     CEFAZOLIN (URINE) Value in next row Sensitive      2 SENSITIVEThis is a modified FDA-approved test that has been validated and its performance  characteristics determined by the reporting laboratory.  This laboratory is certified under the Clinical Laboratory Improvement Amendments CLIA as qualified to perform high complexity clinical laboratory testing.    CEFEPIME Value in next row Sensitive      2 SENSITIVEThis is a modified FDA-approved test that has been validated and its performance characteristics determined by the reporting laboratory.  This laboratory is certified under the Clinical Laboratory Improvement Amendments CLIA as qualified to perform high complexity clinical laboratory testing.    ERTAPENEM Value in next row Sensitive      2 SENSITIVEThis is a modified FDA-approved test that has been validated and its performance characteristics determined by the reporting laboratory.  This laboratory is certified under the Clinical Laboratory Improvement Amendments CLIA as qualified to perform high complexity clinical laboratory testing.    CEFTRIAXONE Value in next row Sensitive      2 SENSITIVEThis is a modified FDA-approved test that has been validated and its performance characteristics determined by the reporting laboratory.  This laboratory is certified under the Clinical Laboratory Improvement Amendments CLIA as qualified to perform high complexity clinical laboratory testing.    CIPROFLOXACIN Value in next row Sensitive      2 SENSITIVEThis is a modified FDA-approved test that has been validated and its performance characteristics determined by the reporting laboratory.  This laboratory is certified under the Clinical Laboratory Improvement Amendments CLIA as qualified to perform high complexity clinical laboratory testing.    GENTAMICIN Value in  next row Sensitive      2 SENSITIVEThis is a modified FDA-approved test that has been validated and its performance characteristics determined by the reporting laboratory.  This laboratory is certified under the Clinical Laboratory Improvement Amendments CLIA as qualified to perform high  complexity clinical laboratory testing.    NITROFURANTOIN Value in next row Sensitive      2 SENSITIVEThis is a modified FDA-approved test that has been validated and its performance characteristics determined by the reporting laboratory.  This laboratory is certified under the Clinical Laboratory Improvement Amendments CLIA as qualified to perform high complexity clinical laboratory testing.    TRIMETH/SULFA Value in next row Sensitive      2 SENSITIVEThis is a modified FDA-approved test that has been validated and its performance characteristics determined by the reporting laboratory.  This laboratory is certified under the Clinical Laboratory Improvement Amendments CLIA as qualified to perform high complexity clinical laboratory testing.    AMPICILLIN/SULBACTAM Value in next row Sensitive      2 SENSITIVEThis is a modified FDA-approved test that has been validated and its performance characteristics determined by the reporting laboratory.  This laboratory is certified under the Clinical Laboratory Improvement Amendments CLIA as qualified to perform high complexity clinical laboratory testing.    PIP/TAZO Value in next row Sensitive ug/mL     <=4 SENSITIVEThis is a modified FDA-approved test that has been validated and its performance characteristics determined by the reporting laboratory.  This laboratory is certified under the Clinical Laboratory Improvement Amendments CLIA as qualified to perform high complexity clinical laboratory testing.    MEROPENEM Value in next row Sensitive      <=4 SENSITIVEThis is a modified FDA-approved test that has been validated and its performance characteristics determined by the reporting laboratory.  This laboratory is certified under the Clinical Laboratory Improvement Amendments CLIA as qualified to perform high complexity clinical laboratory testing.    * >=100,000 COLONIES/mL ESCHERICHIA COLI  Resp panel by RT-PCR (RSV, Flu A&B, Covid) Anterior Nasal Swab      Status: None   Collection Time: 01/11/24 11:00 PM   Specimen: Anterior Nasal Swab  Result Value Ref Range Status   SARS Coronavirus 2 by RT PCR NEGATIVE NEGATIVE Final    Comment: (NOTE) SARS-CoV-2 target nucleic acids are NOT DETECTED.  The SARS-CoV-2 RNA is generally detectable in upper respiratory specimens during the acute phase of infection. The lowest concentration of SARS-CoV-2 viral copies this assay can detect is 138 copies/mL. A negative result does not preclude SARS-Cov-2 infection and should not be used as the sole basis for treatment or other patient management decisions. A negative result may occur with  improper specimen collection/handling, submission of specimen other than nasopharyngeal swab, presence of viral mutation(s) within the areas targeted by this assay, and inadequate number of viral copies(<138 copies/mL). A negative result must be combined with clinical observations, patient history, and epidemiological information. The expected result is Negative.  Fact Sheet for Patients:  BloggerCourse.com  Fact Sheet for Healthcare Providers:  SeriousBroker.it  This test is no t yet approved or cleared by the [place]  FDA and  has been authorized for detection and/or diagnosis of SARS-CoV-2 by FDA under an Emergency Use Authorization (EUA). This EUA will remain  in effect (meaning this test can be used) for the duration of the COVID-19 declaration under Section 564(b)(1) of the Act, 21 U.S.C.section 360bbb-3(b)(1), unless the authorization is terminated  or revoked sooner.       Influenza A by PCR  NEGATIVE NEGATIVE Final   Influenza B by PCR NEGATIVE NEGATIVE Final    Comment: (NOTE) The Xpert Xpress SARS-CoV-2/FLU/RSV plus assay is intended as an aid in the diagnosis of influenza from Nasopharyngeal swab specimens and should not be used as a sole basis for treatment. Nasal washings and aspirates are  unacceptable for Xpert Xpress SARS-CoV-2/FLU/RSV testing.  Fact Sheet for Patients: BloggerCourse.com  Fact Sheet for Healthcare Providers: SeriousBroker.it  This test is not yet approved or cleared by the [place]  FDA and has been authorized for detection and/or diagnosis of SARS-CoV-2 by FDA under an Emergency Use Authorization (EUA). This EUA will remain in effect (meaning this test can be used) for the duration of the COVID-19 declaration under Section 564(b)(1) of the Act, 21 U.S.C. section 360bbb-3(b)(1), unless the authorization is terminated or revoked.     Resp Syncytial Virus by PCR NEGATIVE NEGATIVE Final    Comment: (NOTE) Fact Sheet for Patients: BloggerCourse.com  Fact Sheet for Healthcare Providers: SeriousBroker.it  This test is not yet approved or cleared by the [place]  FDA and has been authorized for detection and/or diagnosis of SARS-CoV-2 by FDA under an Emergency Use Authorization (EUA). This EUA will remain in effect (meaning this test can be used) for the duration of the COVID-19 declaration under Section 564(b)(1) of the Act, 21 U.S.C. section 360bbb-3(b)(1), unless the authorization is terminated or revoked.  Performed at [facility], [address], [place], [place] [address]      Radiological Exams on Admission: DG Chest 2 View Result Date: 01/11/2024 CLINICAL DATA:  SOB EXAM: CHEST - 2 VIEW COMPARISON:  CT angio chest 01/07/2024, chest x-ray 01/07/2024 FINDINGS: The heart and mediastinal contours are unchanged. Atherosclerotic plaque. Persistent left lower to mid lung zone airspace opacity. No pulmonary edema. Persistent blunting of the left costophrenic angle with calcified pleural plaques. No definite left pleural effusion. No right pleural effusion. No pneumothorax. No acute osseous abnormality. IMPRESSION: 1.  Persistent left lower to mid lung zone airspace opacity. Finding better evaluated on CT angiography chest 01/07/2024 2. Persistent blunting of the left costophrenic angle with calcified pleural plaques. No definite left pleural effusion. Electronically Signed   By: Morgane  Naveau M.D.   On: 01/11/2024 23:48    EKG: Independently reviewed. Sinus rhythm, LVH with repolarization abnormality.   Assessment/Plan   1. Acute on chronic HFpEF  - Continue diuresis with IV Lasix , monitor weight and I/Os, update echocardiogram  2. Elevated D-dimer  - D-dimer >20  - She is hemodynamically stable and does not have phlegmasia  - Check CTA chest and LE venous Dopplers    3. Elevated troponin   - Troponin is 41 without chest pain  - Trend troponin, follow-up on chest CTA and echocardiogram findings   4. COPD  - Not in exacerbation  - Continue short-acting bronchodilators as-needed   5. Hypertension  - Continue Norvasc  and hydralazine   6. Hx of CVA  - Crestor     7. Physical debility; frequent falls  - Consult PT    8. Hypokalemia  - Replacing   9. Anemia; thrombocytopenia  - Bone marrow biopsy from 12/13/23 did not reveal etiology and she is planning to follow-up with hematology/oncology, Dr. Lanny  - Counts appear stable with no overt bleeding    DVT prophylaxis: sq heparin   Code Status: Full  Level of Care: Level of care: Telemetry Family Communication: None present  Disposition Plan:  Patient is from: Home  Anticipated d/c is to: SNF  Anticipated d/c date is: 01/14/24  Patient currently: Pending LE Dopplers, CTA chest, echocardiogram, PT eval   Consults called: None Admission status: Inpatient     Debra GORMAN Sprinkles, MD [facility]  01/12/2024, 2:22 AM

## 2024-01-12 NOTE — Progress Notes (Signed)
 PROGRESS NOTE    [name]  FMW:[id number] DOB: [birth year] DOA: 01/11/2024 PCP: Patient, No Pcp Per  Outpatient Specialists:     Brief Narrative:  Patient was admitted earlier today.  As per HPI done earlier today by Dr. Charlton: [name] is a 78 y.o. 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   Patient reports that she has not taken any of her medications since she was discharged and has developed recurrent shortness of breath over the past day.  She has not been coughing much and is not producing sputum.  She denies any chest pain associated with this.  She also denies fevers, chills, dysuria, or flank pain.   She was admitted to the hospital from 01/07/2024 until 01/09/2024 with respiratory failure suspected secondary to viral URI, E. coli UTI, frequent falls, and hypotension due to hypovolemia.  She was treated with Rocephin, IV fluids, and supplemental oxygen, refused SNF, and went home with cefdinir.  She did not require supplemental oxygen at time of discharge.   ED Course: Upon arrival to the ED, patient is found to be afebrile and saturating low 90s on room air with mild tachypnea, normal HR, and stable BP.  Labs are most notable for potassium 3.4, creatinine 1.09, normal WBC, platelets 111,000, D-dimer >20, proBNP 3648, and troponin 41.  Chest x-ray demonstrates persistent airspace opacity in the left lung.   CTA chest was ordered but requires pretreatment for history of contrast allergy.  Patient was given IV Lasix , IV Solu-Medrol , DuoNeb, and Benadryl  in the ED.   01/12/2024: Shortness of breath is slowly improving.  Continue current regimen.   Assessment & Plan:   Principal Problem:   Acute on chronic heart failure with  preserved ejection fraction (HFpEF) (HCC) Active Problems:   COPD (chronic obstructive pulmonary disease) (HCC)   Dyslipidemia   History of stroke   Essential hypertension   Hypokalemia   History of DVT (deep vein thrombosis)   Physical debility   Elevated troponin   Positive D dimer   1. Acute on chronic HFpEF  - Continue diuresis with IV Lasix , monitor weight and I/Os, update echocardiogram   2. Elevated D-dimer  - D-dimer >20  - She is hemodynamically stable and does not have phlegmasia  - Check CTA chest and LE venous Dopplers     3. Elevated troponin   - Troponin is 41 without chest pain  - Trend troponin, follow-up on chest CTA and echocardiogram findings    4. COPD  - Not in exacerbation  - Continue short-acting bronchodilators as-needed    5. Hypertension  - Continue Norvasc  and hydralazine    6. Hx of CVA  - Crestor      7. Physical debility; frequent falls  - Consult PT     8. Hypokalemia  - Replacing    9. Anemia; thrombocytopenia  - Bone marrow biopsy from 12/13/23 did not reveal etiology and she is planning to follow-up with hematology/oncology, Dr. Lanny  - Counts appear stable with no overt bleeding    DVT prophylaxis: Subcu heparin  Code Status: Full code Family Communication:  Disposition Plan: Inpatient   Consultants:  None.  Procedures:  None.  Antimicrobials:  None   Subjective: Shortness of breath has improved  Objective: Vitals:   01/11/24 2255 01/12/24  0015 01/12/24 0238 01/12/24 1205  BP: (!) 149/84 (!) 110/93 (!) 143/98 (!) 171/79  Pulse: 72 63 60 67  Resp: (!) 26 19 20    Temp: 97.6 F (36.4 C)  98.1 F (36.7 C)   TempSrc: Oral  Oral   SpO2: 100% 94% 98%     Intake/Output Summary (Last 24 hours) at 01/12/2024 1644 Last data filed at 01/12/2024 1638 Gross per 24 hour  Intake 180 ml  Output 950 ml  Net -770 ml   There were no vitals filed for this visit.  Examination:  General exam: Appears calm and comfortable   Respiratory system: Clear to auscultation. Respiratory effort normal. Cardiovascular system: S1 & S2 heard Gastrointestinal system: Abdomen is soft and nontender.  Central nervous system: Alert and oriented. No focal neurological deficits. Extremities: Fullness of the ankle  Data Reviewed: I have personally reviewed following labs and imaging studies  CBC: Recent Labs  Lab 01/07/24 2134 01/07/24 2145 01/08/24 0424 01/11/24 2300 01/12/24 0528  WBC 3.4*  --  7.3 6.7 6.0  NEUTROABS 2.6  --   --   --   --   HGB 10.1* 11.6* 10.3* 11.1* 10.9*  HCT 32.1* 34.0* 32.2* 35.8* 34.8*  MCV 99.4  --  97.3 95.7 96.1  PLT 127*  --  115* 111* 113*   Basic Metabolic Panel: Recent Labs  Lab 01/07/24 2134 01/07/24 2145 01/08/24 0424 01/11/24 2300 01/12/24 0528  NA 140 144 140 139 140  K 2.9* 2.8* 4.1 3.4* 3.2*  CL 110 110 109 103 103  CO2 21*  --  20* 21* 21*  GLUCOSE 120* 119* 129* 92 138*  BUN 18 20 19 18 17   CREATININE 1.07* 1.00 0.93 1.09* 0.91  CALCIUM  8.5*  --  8.3* 9.1 9.0  MG  --   --  1.7  --  2.2  PHOS  --   --  3.8  --   --    GFR: Estimated Creatinine Clearance: 61.6 mL/min (by C-G formula based on SCr of 0.91 mg/dL). Liver Function Tests: Recent Labs  Lab 01/07/24 2134 01/11/24 2300  AST 20 23  ALT 11 8  ALKPHOS 58 70  BILITOT 0.6 0.5  PROT 6.7 7.2  ALBUMIN 3.2* 3.8   No results for input(s): LIPASE, AMYLASE in the last 168 hours. No results for input(s): AMMONIA in the last 168 hours. Coagulation Profile: No results for input(s): INR, PROTIME in the last 168 hours. Cardiac Enzymes: No results for input(s): CKTOTAL, CKMB, CKMBINDEX, TROPONINI in the last 168 hours. BNP (last 3 results) Recent Labs    01/11/24 2300  PROBNP 3,648.0*   HbA1C: No results for input(s): HGBA1C in the last 72 hours. CBG: No results for input(s): GLUCAP in the last 168 hours. Lipid Profile: No results for input(s): CHOL, HDL, LDLCALC, TRIG,  CHOLHDL, LDLDIRECT in the last 72 hours. Thyroid Function Tests: No results for input(s): TSH, T4TOTAL, FREET4, T3FREE, THYROIDAB in the last 72 hours. Anemia Panel: No results for input(s): VITAMINB12, FOLATE, FERRITIN, TIBC, IRON, RETICCTPCT in the last 72 hours. Urine analysis:    Component Value Date/Time   COLORURINE YELLOW 01/12/2024 0124   APPEARANCEUR HAZY (A) 01/12/2024 0124   LABSPEC 1.011 01/12/2024 0124   PHURINE 5.0 01/12/2024 0124   GLUCOSEU NEGATIVE 01/12/2024 0124   HGBUR SMALL (A) 01/12/2024 0124   BILIRUBINUR NEGATIVE 01/12/2024 0124   KETONESUR NEGATIVE 01/12/2024 0124   PROTEINUR 30 (A) 01/12/2024 0124   UROBILINOGEN 1.0 11/15/2010 2015  NITRITE NEGATIVE 01/12/2024 0124   LEUKOCYTESUR SMALL (A) 01/12/2024 0124   Sepsis Labs: @LABRCNTIP (procalcitonin:4,lacticidven:4)  ) Recent Results (from the past 240 hours)  Culture, blood (Routine X 2) w Reflex to ID Panel     Status: Abnormal   Collection Time: 01/07/24  9:30 PM   Specimen: BLOOD RIGHT ARM  Result Value Ref Range Status   Specimen Description BLOOD RIGHT ARM  Final   Special Requests   Final    BOTTLES DRAWN AEROBIC AND ANAEROBIC Blood Culture results may not be optimal due to an inadequate volume of blood received in culture bottles   Culture  Setup Time   Final    GRAM POSITIVE RODS IN BOTH AEROBIC AND ANAEROBIC BOTTLES CRITICAL VALUE NOTED.  VALUE IS CONSISTENT WITH PREVIOUSLY REPORTED AND CALLED VALUE.    Culture (A)  Final    CORYNEBACTERIUM, GROUP JK Standardized susceptibility testing for this organism is not available. Performed at [facility], [address], [place], [place] [address]    Report Status 01/11/2024 FINAL  Final  Resp panel by RT-PCR (RSV, Flu A&B, Covid) Anterior Nasal Swab     Status: None   Collection Time: 01/07/24  9:31 PM   Specimen: Anterior Nasal Swab  Result Value Ref Range Status   SARS Coronavirus 2 by RT PCR NEGATIVE NEGATIVE  Final   Influenza A by PCR NEGATIVE NEGATIVE Final   Influenza B by PCR NEGATIVE NEGATIVE Final    Comment: (NOTE) The Xpert Xpress SARS-CoV-2/FLU/RSV plus assay is intended as an aid in the diagnosis of influenza from Nasopharyngeal swab specimens and should not be used as a sole basis for treatment. Nasal washings and aspirates are unacceptable for Xpert Xpress SARS-CoV-2/FLU/RSV testing.  Fact Sheet for Patients: BloggerCourse.com  Fact Sheet for Healthcare Providers: SeriousBroker.it  This test is not yet approved or cleared by the [place]  FDA and has been authorized for detection and/or diagnosis of SARS-CoV-2 by FDA under an Emergency Use Authorization (EUA). This EUA will remain in effect (meaning this test can be used) for the duration of the COVID-19 declaration under Section 564(b)(1) of the Act, 21 U.S.C. section 360bbb-3(b)(1), unless the authorization is terminated or revoked.     Resp Syncytial Virus by PCR NEGATIVE NEGATIVE Final    Comment: (NOTE) Fact Sheet for Patients: BloggerCourse.com  Fact Sheet for Healthcare Providers: SeriousBroker.it  This test is not yet approved or cleared by the [place]  FDA and has been authorized for detection and/or diagnosis of SARS-CoV-2 by FDA under an Emergency Use Authorization (EUA). This EUA will remain in effect (meaning this test can be used) for the duration of the COVID-19 declaration under Section 564(b)(1) of the Act, 21 U.S.C. section 360bbb-3(b)(1), unless the authorization is terminated or revoked.  Performed at [facility], [address] [place], [place] [address]   Respiratory (~20 pathogens) panel by PCR     Status: None   Collection Time: 01/07/24  9:31 PM   Specimen: Nasopharyngeal Swab; Respiratory  Result Value Ref Range Status   Adenovirus NOT DETECTED NOT DETECTED Final    Coronavirus 229E NOT DETECTED NOT DETECTED Final    Comment: (NOTE) The Coronavirus on the Respiratory Panel, DOES NOT test for the novel  Coronavirus (2019 nCoV)    Coronavirus HKU1 NOT DETECTED NOT DETECTED Final   Coronavirus NL63 NOT DETECTED NOT DETECTED Final   Coronavirus OC43 NOT DETECTED NOT DETECTED Final   Metapneumovirus NOT DETECTED NOT DETECTED Final  Rhinovirus / Enterovirus NOT DETECTED NOT DETECTED Final   Influenza A NOT DETECTED NOT DETECTED Final   Influenza B NOT DETECTED NOT DETECTED Final   Parainfluenza Virus 1 NOT DETECTED NOT DETECTED Final   Parainfluenza Virus 2 NOT DETECTED NOT DETECTED Final   Parainfluenza Virus 3 NOT DETECTED NOT DETECTED Final   Parainfluenza Virus 4 NOT DETECTED NOT DETECTED Final   Respiratory Syncytial Virus NOT DETECTED NOT DETECTED Final   Bordetella pertussis NOT DETECTED NOT DETECTED Final   Bordetella Parapertussis NOT DETECTED NOT DETECTED Final   Chlamydophila pneumoniae NOT DETECTED NOT DETECTED Final   Mycoplasma pneumoniae NOT DETECTED NOT DETECTED Final    Comment: Performed at [facility], [address] [place], [place] [address]  Culture, blood (Routine X 2) w Reflex to ID Panel     Status: Abnormal   Collection Time: 01/07/24  9:34 PM   Specimen: BLOOD RIGHT ARM  Result Value Ref Range Status   Specimen Description BLOOD RIGHT ARM  Final   Special Requests   Final    BOTTLES DRAWN AEROBIC AND ANAEROBIC Blood Culture results may not be optimal due to an inadequate volume of blood received in culture bottles   Culture  Setup Time   Final    GRAM POSITIVE COCCI GRAM POSITIVE RODS BOTTLES DRAWN AEROBIC ONLY CRITICAL RESULT CALLED TO, READ BACK BY AND VERIFIED WITH: PHARMD DOROTHA BARLOW [id number] @ 2128 FH    Culture (A)  Final    AEROCOCCUS VIRIDANS BACILLUS SPECIES Standardized susceptibility testing for this organism is not available. Performed at [facility], [address] [place], [place] [address]     Report Status 01/11/2024 FINAL  Final  Blood Culture ID Panel (Reflexed)     Status: None   Collection Time: 01/07/24  9:34 PM  Result Value Ref Range Status   Enterococcus faecalis NOT DETECTED NOT DETECTED Final   Enterococcus Faecium NOT DETECTED NOT DETECTED Final   Listeria monocytogenes NOT DETECTED NOT DETECTED Final   Staphylococcus species NOT DETECTED NOT DETECTED Final   Staphylococcus aureus (BCID) NOT DETECTED NOT DETECTED Final   Staphylococcus epidermidis NOT DETECTED NOT DETECTED Final   Staphylococcus lugdunensis NOT DETECTED NOT DETECTED Final   Streptococcus species NOT DETECTED NOT DETECTED Final   Streptococcus agalactiae NOT DETECTED NOT DETECTED Final   Streptococcus pneumoniae NOT DETECTED NOT DETECTED Final   Streptococcus pyogenes NOT DETECTED NOT DETECTED Final   A.calcoaceticus-baumannii NOT DETECTED NOT DETECTED Final   Bacteroides fragilis NOT DETECTED NOT DETECTED Final   Enterobacterales NOT DETECTED NOT DETECTED Final   Enterobacter cloacae complex NOT DETECTED NOT DETECTED Final   Escherichia coli NOT DETECTED NOT DETECTED Final   Klebsiella aerogenes NOT DETECTED NOT DETECTED Final   Klebsiella oxytoca NOT DETECTED NOT DETECTED Final   Klebsiella pneumoniae NOT DETECTED NOT DETECTED Final   Proteus species NOT DETECTED NOT DETECTED Final   Salmonella species NOT DETECTED NOT DETECTED Final   Serratia marcescens NOT DETECTED NOT DETECTED Final   Haemophilus influenzae NOT DETECTED NOT DETECTED Final   Neisseria meningitidis NOT DETECTED NOT DETECTED Final   Pseudomonas aeruginosa NOT DETECTED NOT DETECTED Final   Stenotrophomonas maltophilia NOT DETECTED NOT DETECTED Final   Candida albicans NOT DETECTED NOT DETECTED Final   Candida auris NOT DETECTED NOT DETECTED Final   Candida glabrata NOT DETECTED NOT DETECTED Final   Candida krusei NOT DETECTED NOT DETECTED Final   Candida parapsilosis NOT DETECTED NOT DETECTED Final  Candida tropicalis  NOT DETECTED NOT DETECTED Final   Cryptococcus neoformans/gattii NOT DETECTED NOT DETECTED Final    Comment: Performed at [facility], [address] [place], [place] [address]  Urine Culture     Status: Abnormal   Collection Time: 01/08/24  4:19 AM   Specimen: Urine, Random  Result Value Ref Range Status   Specimen Description URINE, RANDOM  Final   Special Requests   Final    NONE Reflexed from [id number] Performed at [facility], [address] [place], [place] [address]    Culture >=100,000 COLONIES/mL ESCHERICHIA COLI (A)  Final   Report Status 01/10/2024 FINAL  Final   Organism ID, Bacteria ESCHERICHIA COLI (A)  Final      Susceptibility   Escherichia coli - MIC*    AMPICILLIN 8 SENSITIVE Sensitive     CEFAZOLIN (URINE) Value in next row Sensitive      2 SENSITIVEThis is a modified FDA-approved test that has been validated and its performance characteristics determined by the reporting laboratory.  This laboratory is certified under the Clinical Laboratory Improvement Amendments CLIA as qualified to perform high complexity clinical laboratory testing.    CEFEPIME Value in next row Sensitive      2 SENSITIVEThis is a modified FDA-approved test that has been validated and its performance characteristics determined by the reporting laboratory.  This laboratory is certified under the Clinical Laboratory Improvement Amendments CLIA as qualified to perform high complexity clinical laboratory testing.    ERTAPENEM Value in next row Sensitive      2 SENSITIVEThis is a modified FDA-approved test that has been validated and its performance characteristics determined by the reporting laboratory.  This laboratory is certified under the Clinical Laboratory Improvement Amendments CLIA as qualified to perform high complexity clinical laboratory testing.    CEFTRIAXONE Value in next row Sensitive      2 SENSITIVEThis is a modified FDA-approved test that has been validated and its  performance characteristics determined by the reporting laboratory.  This laboratory is certified under the Clinical Laboratory Improvement Amendments CLIA as qualified to perform high complexity clinical laboratory testing.    CIPROFLOXACIN Value in next row Sensitive      2 SENSITIVEThis is a modified FDA-approved test that has been validated and its performance characteristics determined by the reporting laboratory.  This laboratory is certified under the Clinical Laboratory Improvement Amendments CLIA as qualified to perform high complexity clinical laboratory testing.    GENTAMICIN Value in next row Sensitive      2 SENSITIVEThis is a modified FDA-approved test that has been validated and its performance characteristics determined by the reporting laboratory.  This laboratory is certified under the Clinical Laboratory Improvement Amendments CLIA as qualified to perform high complexity clinical laboratory testing.    NITROFURANTOIN Value in next row Sensitive      2 SENSITIVEThis is a modified FDA-approved test that has been validated and its performance characteristics determined by the reporting laboratory.  This laboratory is certified under the Clinical Laboratory Improvement Amendments CLIA as qualified to perform high complexity clinical laboratory testing.    TRIMETH/SULFA Value in next row Sensitive      2 SENSITIVEThis is a modified FDA-approved test that has been validated and its performance characteristics determined by the reporting laboratory.  This laboratory is certified under the Clinical Laboratory Improvement Amendments CLIA as qualified to perform high complexity clinical laboratory testing.    AMPICILLIN/SULBACTAM Value in next row Sensitive  2 SENSITIVEThis is a modified FDA-approved test that has been validated and its performance characteristics determined by the reporting laboratory.  This laboratory is certified under the Clinical Laboratory Improvement Amendments CLIA as  qualified to perform high complexity clinical laboratory testing.    PIP/TAZO Value in next row Sensitive ug/mL     <=4 SENSITIVEThis is a modified FDA-approved test that has been validated and its performance characteristics determined by the reporting laboratory.  This laboratory is certified under the Clinical Laboratory Improvement Amendments CLIA as qualified to perform high complexity clinical laboratory testing.    MEROPENEM Value in next row Sensitive      <=4 SENSITIVEThis is a modified FDA-approved test that has been validated and its performance characteristics determined by the reporting laboratory.  This laboratory is certified under the Clinical Laboratory Improvement Amendments CLIA as qualified to perform high complexity clinical laboratory testing.    * >=100,000 COLONIES/mL ESCHERICHIA COLI  Resp panel by RT-PCR (RSV, Flu A&B, Covid) Anterior Nasal Swab     Status: None   Collection Time: 01/11/24 11:00 PM   Specimen: Anterior Nasal Swab  Result Value Ref Range Status   SARS Coronavirus 2 by RT PCR NEGATIVE NEGATIVE Final    Comment: (NOTE) SARS-CoV-2 target nucleic acids are NOT DETECTED.  The SARS-CoV-2 RNA is generally detectable in upper respiratory specimens during the acute phase of infection. The lowest concentration of SARS-CoV-2 viral copies this assay can detect is 138 copies/mL. A negative result does not preclude SARS-Cov-2 infection and should not be used as the sole basis for treatment or other patient management decisions. A negative result may occur with  improper specimen collection/handling, submission of specimen other than nasopharyngeal swab, presence of viral mutation(s) within the areas targeted by this assay, and inadequate number of viral copies(<138 copies/mL). A negative result must be combined with clinical observations, patient history, and epidemiological information. The expected result is Negative.  Fact Sheet for Patients:   BloggerCourse.com  Fact Sheet for Healthcare Providers:  SeriousBroker.it  This test is no t yet approved or cleared by the [place]  FDA and  has been authorized for detection and/or diagnosis of SARS-CoV-2 by FDA under an Emergency Use Authorization (EUA). This EUA will remain  in effect (meaning this test can be used) for the duration of the COVID-19 declaration under Section 564(b)(1) of the Act, 21 U.S.C.section 360bbb-3(b)(1), unless the authorization is terminated  or revoked sooner.       Influenza A by PCR NEGATIVE NEGATIVE Final   Influenza B by PCR NEGATIVE NEGATIVE Final    Comment: (NOTE) The Xpert Xpress SARS-CoV-2/FLU/RSV plus assay is intended as an aid in the diagnosis of influenza from Nasopharyngeal swab specimens and should not be used as a sole basis for treatment. Nasal washings and aspirates are unacceptable for Xpert Xpress SARS-CoV-2/FLU/RSV testing.  Fact Sheet for Patients: BloggerCourse.com  Fact Sheet for Healthcare Providers: SeriousBroker.it  This test is not yet approved or cleared by the [place]  FDA and has been authorized for detection and/or diagnosis of SARS-CoV-2 by FDA under an Emergency Use Authorization (EUA). This EUA will remain in effect (meaning this test can be used) for the duration of the COVID-19 declaration under Section 564(b)(1) of the Act, 21 U.S.C. section 360bbb-3(b)(1), unless the authorization is terminated or revoked.     Resp Syncytial Virus by PCR NEGATIVE NEGATIVE Final    Comment: (NOTE) Fact Sheet for Patients: BloggerCourse.com  Fact Sheet for Healthcare Providers: SeriousBroker.it  This test is  not yet approved or cleared by the [place]  FDA and has been authorized for detection and/or diagnosis of SARS-CoV-2 by FDA under an Emergency Use  Authorization (EUA). This EUA will remain in effect (meaning this test can be used) for the duration of the COVID-19 declaration under Section 564(b)(1) of the Act, 21 U.S.C. section 360bbb-3(b)(1), unless the authorization is terminated or revoked.  Performed at [facility], [address], [place], [place] [address]          Radiology Studies: VAS US  LOWER EXTREMITY VENOUS (DVT) Result Date: 01/12/2024  Lower Venous DVT Study Patient Name:  AWA BACHICHA  Date of Exam:   01/12/2024 Medical Rec #: [id number]    Accession #:    [id number] Date of Birth: January 02, 1946   Patient Gender: F Patient Age:   3 years Exam Location:  [facility] Procedure:      VAS US  LOWER EXTREMITY VENOUS (DVT) Referring Phys: EVALENE OPYD --------------------------------------------------------------------------------  Indications: Edema, SOB, and Patient discharged 01/09/2024 after presenting with shortness of breath. Patient states she has not taken any of her medication since discharge. History of COPD and CHF (BNP 3000 this admission).  Comparison Study: Prior right LEV done 12/05/23 Performing Technologist: Alberta Lis RVS  Examination Guidelines: A complete evaluation includes B-mode imaging, spectral Doppler, color Doppler, and power Doppler as needed of all accessible portions of each vessel. Bilateral testing is considered an integral part of a complete examination. Limited examinations for reoccurring indications may be performed as noted. The reflux portion of the exam is performed with the patient in reverse Trendelenburg.  +---------+---------------+---------+-----------+----------+--------------+ RIGHT    CompressibilityPhasicitySpontaneityPropertiesThrombus Aging +---------+---------------+---------+-----------+----------+--------------+ CFV      Partial        Yes      No                   Chronic         +---------+---------------+---------+-----------+----------+--------------+ SFJ      Full                                                        +---------+---------------+---------+-----------+----------+--------------+ FV Prox  Partial        Yes      No                   Chronic        +---------+---------------+---------+-----------+----------+--------------+ FV Mid   Partial        Yes      No                   Chronic        +---------+---------------+---------+-----------+----------+--------------+ FV DistalPartial        Yes      No                   Chronic        +---------+---------------+---------+-----------+----------+--------------+ PFV      Partial        Yes      No                   Chronic        +---------+---------------+---------+-----------+----------+--------------+ POP      Partial        No       Yes  Chronic        +---------+---------------+---------+-----------+----------+--------------+ PTV      Partial                                      Chronic        +---------+---------------+---------+-----------+----------+--------------+ PERO     Partial                                      Chronic        +---------+---------------+---------+-----------+----------+--------------+   +---------+---------------+---------+-----------+----------+--------------+ LEFT     CompressibilityPhasicitySpontaneityPropertiesThrombus Aging +---------+---------------+---------+-----------+----------+--------------+ CFV      Full           Yes      Yes                                 +---------+---------------+---------+-----------+----------+--------------+ SFJ      Full                                                        +---------+---------------+---------+-----------+----------+--------------+ FV Prox  Full                                                         +---------+---------------+---------+-----------+----------+--------------+ FV Mid   Full                                                        +---------+---------------+---------+-----------+----------+--------------+ FV DistalFull                                                        +---------+---------------+---------+-----------+----------+--------------+ PFV      Full           Yes      No                                  +---------+---------------+---------+-----------+----------+--------------+ POP      Full           Yes      No                                  +---------+---------------+---------+-----------+----------+--------------+ PTV      Full                                                        +---------+---------------+---------+-----------+----------+--------------+  PERO     Full                                                        +---------+---------------+---------+-----------+----------+--------------+    Summary: RIGHT: - Findings consistent with chronic deep vein thrombosis involving the right common femoral vein, right femoral vein, right proximal profunda vein, right popliteal vein, right posterior tibial veins, and right peroneal veins.  - Findings appear essentially unchanged compared to previous examination. - No cystic structure found in the popliteal fossa.  LEFT: - There is no evidence of deep vein thrombosis in the lower extremity.  - No cystic structure found in the popliteal fossa.  *See table(s) above for measurements and observations.    Preliminary    CT Angio Chest PE W and/or Wo Contrast Result Date: 01/12/2024 CLINICAL DATA:  Shortness of breath. EXAM: CT ANGIOGRAPHY CHEST WITH CONTRAST TECHNIQUE: Multidetector CT imaging of the chest was performed using the standard protocol during bolus administration of intravenous contrast. Multiplanar CT image reconstructions and MIPs were obtained to evaluate the vascular anatomy.  RADIATION DOSE REDUCTION: This exam was performed according to the departmental dose-optimization program which includes automated exposure control, adjustment of the mA and/or kV according to patient size and/or use of iterative reconstruction technique. CONTRAST:  75mL OMNIPAQUE  IOHEXOL  350 MG/ML SOLN COMPARISON:  01/07/2024 FINDINGS: Cardiovascular: The heart size is upper normal to borderline enlarged. No substantial pericardial effusion. Coronary artery calcification is evident. Moderate atherosclerotic calcification is noted in the wall of the thoracic aorta. Stable appearance of the focal outpouching from the inferior wall of the transverse aorta suggesting ulcerated plaque or penetrating ulcer. Previously described dilation of the distal transverse/proximal descending thoracic aorta up to 3.6 cm, stable. There is no filling defect within the opacified pulmonary arteries to suggest the presence of an acute pulmonary embolus. Mediastinum/Nodes: Similar mild mediastinal lymphadenopathy including 11 mm short axis prevascular node on 37/4 and 11 mm short axis subcarinal node on 42/4. Small hiatal hernia. The esophagus has normal imaging features. There is no axillary lymphadenopathy. Lungs/Pleura: Centrilobular emphsyema noted. Chronic atelectasis/scarring noted superior segment left lower lobe. Stable 10 mm subpleural nodule left lower lobe on 108/6. Calcified pleural plaques noted left hemithorax. Upper Abdomen: Visualized portion of the upper abdomen shows no acute findings. Musculoskeletal: No worrisome lytic or sclerotic osseous abnormality. Review of the MIP images confirms the above findings. IMPRESSION: 1. No CT evidence for acute pulmonary embolus. 2. 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 Recommend annual imaging followup by CTA or MRA. This recommendation follows 2010 ACCF/AHA/AATS/ACR/ASA/SCA/SCAI/SIR/STS/SVM Guidelines for the Diagnosis and Management of Patients with Thoracic  Aortic Disease. Circulation. 2010; 121: Z733-z630. Aortic aneurysm NOS (ICD10-I71.9) 3. No change in focal outpouching from the inferior wall of the transverse aorta suggesting ulcerated plaque or penetrating ulcer. 4. Stable 10 mm subpleural nodule left lower lobe. Attention on follow-up recommended. 5. Stable mild mediastinal lymphadenopathy. 6. Small hiatal hernia. 7. Aortic Atherosclerosis (ICD10-I70.0) and Emphysema (ICD10-J43.9). Electronically Signed   By: Camellia Candle M.D.   On: 01/12/2024 05:09   DG Chest 2 View Result Date: 01/11/2024 CLINICAL DATA:  SOB EXAM: CHEST - 2 VIEW COMPARISON:  CT angio chest 01/07/2024, chest x-ray 01/07/2024 FINDINGS: The heart and mediastinal contours are unchanged. Atherosclerotic plaque. Persistent left lower to mid lung zone airspace opacity.  No pulmonary edema. Persistent blunting of the left costophrenic angle with calcified pleural plaques. No definite left pleural effusion. No right pleural effusion. No pneumothorax. No acute osseous abnormality. IMPRESSION: 1. Persistent left lower to mid lung zone airspace opacity. Finding better evaluated on CT angiography chest 01/07/2024 2. Persistent blunting of the left costophrenic angle with calcified pleural plaques. No definite left pleural effusion. Electronically Signed   By: Morgane  Naveau M.D.   On: 01/11/2024 23:48        Scheduled Meds:  amLODipine   10 mg Oral Daily   furosemide   40 mg Intravenous Q12H   heparin   5,000 Units Subcutaneous Q8H   hydrALAZINE   50 mg Oral Q8H   pantoprazole   40 mg Oral Daily   rosuvastatin   20 mg Oral Daily   sodium chloride  flush  3 mL Intravenous Q12H   Continuous Infusions:   LOS: 0 days    Time spent: 55 minutes    Leatrice Chapel, MD  [facility] Pager #: [phone] 7PM-7AM contact night coverage as above

## 2024-01-12 NOTE — Progress Notes (Signed)
 VASCULAR LAB    Bilateral lower extremity venous duplex has been performed.  See CV proc for preliminary results.   Erisa Mehlman, RVT 01/12/2024, 9:58 AM

## 2024-01-13 DIAGNOSIS — I5033 Acute on chronic diastolic (congestive) heart failure: Secondary | ICD-10-CM | POA: Diagnosis not present

## 2024-01-13 LAB — BASIC METABOLIC PANEL WITH GFR
Anion gap: 12 (ref 5–15)
BUN: 31 mg/dL — ABNORMAL HIGH (ref 8–23)
CO2: 22 mmol/L (ref 22–32)
Calcium: 8.5 mg/dL — ABNORMAL LOW (ref 8.9–10.3)
Chloride: 106 mmol/L (ref 98–111)
Creatinine, Ser: 1.05 mg/dL — ABNORMAL HIGH (ref 0.44–1.00)
GFR, Estimated: 54 mL/min — ABNORMAL LOW (ref >60)
Glucose, Bld: 94 mg/dL (ref 70–99)
Potassium: 3.9 mmol/L (ref 3.5–5.1)
Sodium: 140 mmol/L (ref 135–145)

## 2024-01-13 LAB — RENAL FUNCTION PANEL
Albumin: 3.6 g/dL (ref 3.5–5.0)
Anion gap: 12 (ref 5–15)
BUN: 30 mg/dL — ABNORMAL HIGH (ref 8–23)
CO2: 22 mmol/L (ref 22–32)
Calcium: 8.5 mg/dL — ABNORMAL LOW (ref 8.9–10.3)
Chloride: 106 mmol/L (ref 98–111)
Creatinine, Ser: 1.01 mg/dL — ABNORMAL HIGH (ref 0.44–1.00)
GFR, Estimated: 56 mL/min — ABNORMAL LOW (ref >60)
Glucose, Bld: 94 mg/dL (ref 70–99)
Phosphorus: 2.9 mg/dL (ref 2.5–4.6)
Potassium: 3.9 mmol/L (ref 3.5–5.1)
Sodium: 140 mmol/L (ref 135–145)

## 2024-01-13 LAB — CBC
HCT: 29.2 % — ABNORMAL LOW (ref 36.0–46.0)
Hemoglobin: 9.4 g/dL — ABNORMAL LOW (ref 12.0–15.0)
MCH: 30.9 pg (ref 26.0–34.0)
MCHC: 32.2 g/dL (ref 30.0–36.0)
MCV: 96.1 fL (ref 80.0–100.0)
Platelets: 114 K/uL — ABNORMAL LOW (ref 150–400)
RBC: 3.04 MIL/uL — ABNORMAL LOW (ref 3.87–5.11)
RDW: 15 % (ref 11.5–15.5)
WBC: 9.1 K/uL (ref 4.0–10.5)
nRBC: 0 % (ref 0.0–0.2)

## 2024-01-13 LAB — MAGNESIUM: Magnesium: 2.1 mg/dL (ref 1.7–2.4)

## 2024-01-13 MED ORDER — DIPHENHYDRAMINE HCL 25 MG PO CAPS
25.0000 mg | ORAL_CAPSULE | Freq: Once | ORAL | Status: AC
  Administered 2024-01-13: 25 mg via ORAL
  Filled 2024-01-13: qty 1

## 2024-01-13 MED ORDER — POLYETHYLENE GLYCOL 3350 17 G PO PACK
17.0000 g | PACK | Freq: Every day | ORAL | Status: DC
  Administered 2024-01-13 – 2024-01-20 (×8): 17 g via ORAL
  Filled 2024-01-13 (×8): qty 1

## 2024-01-13 MED ORDER — POTASSIUM CHLORIDE 10 MEQ/100ML IV SOLN
10.0000 meq | Freq: Once | INTRAVENOUS | Status: AC
Start: 2024-01-13 — End: 2024-01-13
  Administered 2024-01-13: 10 meq via INTRAVENOUS
  Filled 2024-01-13: qty 100

## 2024-01-13 NOTE — Evaluation (Signed)
 Physical Therapy Evaluation Patient Details Name: [name] MRN: [id number] DOB: 24-Dec-1945 Today's Date: 01/13/2024  History of Present Illness  78 y.o. 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 She was able to stand and take a few steps at the bedside with a RW. Pt had difficulty advancing L LE in all directions. She is at risk for further falls when mobilizing. Pt presents with weakness, decreased activity tolerance, and impaired gait/balance. No family was present during session. On last admission, therapy recommended SNF but pt declined placement, per chart review. Patient could possibly benefit from continued inpatient follow up therapy, <3 hours/day , if agreeable. If pt declines placement and chooses to return home, recommend HHPT f/u.       If plan is discharge home, recommend the following: A lot of help with walking and/or transfers;A lot of help with bathing/dressing/bathroom;Assist for transportation;Help with stairs or ramp for entrance;Supervision due to cognitive status   Can travel by private vehicle        Equipment Recommendations None recommended by PT  Recommendations for Other Services       Functional Status Assessment Patient has had a recent decline in their functional status and demonstrates the ability to make significant improvements in function in a reasonable and predictable amount of time.     Precautions / Restrictions Precautions Precautions: Fall Recall of Precautions/Restrictions: Impaired Restrictions Weight Bearing Restrictions Per Provider Order: No      Mobility  Bed Mobility Overal bed mobility: Needs  Assistance Bed Mobility: Supine to Sit, Sit to Supine     Supine to sit: Mod assist, HOB elevated, Used rails Sit to supine: Mod assist, HOB elevated, Used rails   General bed mobility comments: Max cueing required. Assist for trunk and bil LEs. Assist to scoot to EOB and to scoot laterally along EOB. Increased time.    Transfers Overall transfer level: Needs assistance Equipment used: Rolling walker (2 wheels) Transfers: Sit to/from Stand Sit to Stand: Mod assist, From elevated surface           General transfer comment: Increased time. Assist to power up, stabilize, control descent. Initially with posterior bias in standing but pt able to correct with therapist steadying her. Pt with difficulty advancing L LE.    Ambulation/Gait Ambulation/Gait assistance: Mod assist Gait Distance (Feet): 1 Feet Assistive device: Rolling walker (2 wheels)         General Gait Details: Lateral steps along bedside with RW. Assist to steady pt and manage RW. Pt had difficulty advancing L LE in all directions. Pt also able to take a couple of steps forward then backwards with time.  Stairs            Wheelchair Mobility     Tilt Bed    Modified Rankin (Stroke Patients Only)       Balance Overall balance assessment: Needs assistance, History of Falls Sitting-balance support: Feet supported Sitting balance-Leahy Scale: Fair     Standing balance support: Bilateral upper extremity supported, During functional activity, Reliant on assistive device for balance Standing balance-Leahy Scale: Poor  Pertinent Vitals/Pain Pain Assessment Pain Assessment: Faces Faces Pain Scale: No hurt    Home Living Family/patient expects to be discharged to:: Private residence Living Arrangements: Children Available Help at Discharge: Available 24 hours/day Type of Home: Apartment Home Access: Level entry     Alternate Level Stairs-Number of  Steps: flight Home Layout: Two level;Able to live on main level with bedroom/bathroom Home Equipment: Rolling Walker (2 wheels);BSC/3in1;Shower seat;Wheelchair - manual Additional Comments: info taken from previous admission    Prior Function Prior Level of Function : Needs assist;Patient poor historian/Family not available             Mobility Comments: on today, pt reports she does not walk at home. grandson assists with mobility. ADLs Comments: reports daughter prepares meals, assists with dressing and bathing and drives (info taken from previous admission)     Extremity/Trunk Assessment   Upper Extremity Assessment Upper Extremity Assessment: Defer to OT evaluation LUE Deficits / Details: residual hemiparesis; uses as a funcitonal assist    Lower Extremity Assessment Lower Extremity Assessment: Generalized weakness LLE Deficits / Details: generalized weakness, residual from previous stroke, difficulty advancing in step pvt transfer LLE Coordination: decreased gross motor;decreased fine motor       Communication   Communication Communication: Impaired Factors Affecting Communication: Difficulty expressing self (slow to respond)    Cognition Arousal: Alert Behavior During Therapy: Flat affect   PT - Cognitive impairments: No family/caregiver present to determine baseline, Sequencing, Initiation, Problem solving                         Following commands: Impaired Following commands impaired: Follows one step commands inconsistently     Cueing Cueing Techniques: Verbal cues, Gestural cues, Tactile cues, Visual cues     General Comments      Exercises     Assessment/Plan    PT Assessment Patient needs continued PT services  PT Problem List Decreased strength;Decreased range of motion;Decreased activity tolerance;Decreased balance;Decreased mobility;Decreased knowledge of use of DME;Decreased cognition;Decreased safety awareness       PT Treatment  Interventions DME instruction;Gait training;Stair training;Functional mobility training;Therapeutic activities;Therapeutic exercise;Balance training;Neuromuscular re-education    PT Goals (Current goals can be found in the Care Plan section)  Acute Rehab PT Goals Patient Stated Goal: didn't state PT Goal Formulation: Patient unable to participate in goal setting Time For Goal Achievement: 01/27/24 Potential to Achieve Goals: Fair    Frequency Min 3X/week     Co-evaluation               AM-PAC PT 6 Clicks Mobility  Outcome Measure Help needed turning from your back to your side while in a flat bed without using bedrails?: A Lot Help needed moving from lying on your back to sitting on the side of a flat bed without using bedrails?: A Lot Help needed moving to and from a bed to a chair (including a wheelchair)?: A Lot Help needed standing up from a chair using your arms (e.g., wheelchair or bedside chair)?: A Lot Help needed to walk in hospital room?: Total Help needed climbing 3-5 steps with a railing? : Total 6 Click Score: 10    End of Session Equipment Utilized During Treatment: Gait belt Activity Tolerance: Patient limited by fatigue Patient left: in bed;with call bell/phone within reach;with bed alarm set   PT Visit Diagnosis: Muscle weakness (generalized) (M62.81);Difficulty in walking, not elsewhere classified (R26.2);History of falling (Z91.81)    Time: 8458-8442 PT Time  Calculation (min) (ACUTE ONLY): 16 min   Charges:   PT Evaluation $PT Eval Low Complexity: 1 Low   PT General Charges $$ ACUTE PT VISIT: 1 Visit            Dannial SQUIBB, PT Acute Rehabilitation  Office: [phone]

## 2024-01-13 NOTE — Progress Notes (Signed)
 PROGRESS NOTE    [name]  FMW:[id number] DOB: 1946-04-28 DOA: 01/11/2024 PCP: Patient, No Pcp Per  Outpatient Specialists:     Brief Narrative:  Patient was admitted earlier today.  As per HPI done earlier today by Dr. Charlton: [name] is a 78 y.o. 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   Patient reports that she has not taken any of her medications since she was discharged and has developed recurrent shortness of breath over the past day.  She has not been coughing much and is not producing sputum.  She denies any chest pain associated with this.  She also denies fevers, chills, dysuria, or flank pain.   She was admitted to the hospital from 01/07/2024 until 01/09/2024 with respiratory failure suspected secondary to viral URI, E. coli UTI, frequent falls, and hypotension due to hypovolemia.  She was treated with Rocephin, IV fluids, and supplemental oxygen, refused SNF, and went home with cefdinir.  She did not require supplemental oxygen at time of discharge.   ED Course: Upon arrival to the ED, patient is found to be afebrile and saturating low 90s on room air with mild tachypnea, normal HR, and stable BP.  Labs are most notable for potassium 3.4, creatinine 1.09, normal WBC, platelets 111,000, D-dimer >20, proBNP 3648, and troponin 41.  Chest x-ray demonstrates persistent airspace opacity in the left lung.   CTA chest was ordered but requires pretreatment for history of contrast allergy.  Patient was given IV Lasix , IV Solu-Medrol , DuoNeb, and Benadryl  in the ED.   01/12/2024: Shortness of breath is slowly improving.  Continue current regimen. 01/13/2024: Patient seen.  Patient continues to improve.  Physical therapy input is highly  appreciated, as SNF is recommended.   Assessment & Plan:   Principal Problem:   Acute on chronic heart failure with preserved ejection fraction (HFpEF) (HCC) Active Problems:   COPD (chronic obstructive pulmonary disease) (HCC)   Dyslipidemia   History of stroke   Essential hypertension   Hypokalemia   History of DVT (deep vein thrombosis)   Physical debility   Elevated troponin   Positive D dimer   1. Acute on chronic HFpEF  - Continue diuresis with IV Lasix , monitor weight and I/Os, update echocardiogram 01/13/2024: Improved significantly.  Pursue disposition.   2. Elevated D-dimer  - D-dimer >20  - She is hemodynamically stable and does not have phlegmasia  - Check CTA chest and LE venous Dopplers   01/13/2024: CTA chest was negative for pulmonary embolism.   3. Elevated troponin   - Troponin is 41 without chest pain  - Trend troponin, follow-up on chest CTA and echocardiogram findings    4. COPD  - Not in exacerbation  - Continue short-acting bronchodilators as-needed    5. Hypertension  - Continue Norvasc  and hydralazine    6. Hx of CVA  - Crestor      7. Physical debility; frequent falls  - Consult PT     8. Hypokalemia  - Replacing    9. Anemia; thrombocytopenia  - Bone marrow biopsy from 12/13/23 did not reveal etiology and she is planning to follow-up with hematology/oncology, Dr. Lanny  - Counts appear stable with no overt bleeding    DVT prophylaxis: Subcu heparin  Code Status: Full code Family Communication:  Disposition Plan: Inpatient   Consultants:  None.  Procedures:  None.  Antimicrobials:  None   Subjective: Shortness of breath has improved significantly  Objective: Vitals:   01/13/24 0701 01/13/24 0932 01/13/24 1318 01/13/24 1432  BP:  (!) 160/64 (!) 117/50 135/67  Pulse:   (!) 58 64  Resp:   19   Temp:   97.6 F (36.4 C)   TempSrc:   Oral   SpO2:   100%   Weight: 89.8 kg     Height: 5' 10 (1.778 m)       Intake/Output  Summary (Last 24 hours) at 01/13/2024 1633 Last data filed at 01/13/2024 1439 Gross per 24 hour  Intake 303 ml  Output 1450 ml  Net -1147 ml   Filed Weights   01/13/24 0701  Weight: 89.8 kg    Examination:  General exam: Appears calm and comfortable  Respiratory system: Clear to auscultation. Respiratory effort normal. Cardiovascular system: S1 & S2 heard Gastrointestinal system: Abdomen is soft and nontender.  Central nervous system: Alert and oriented. No focal neurological deficits. Extremities: Fullness of the ankle  Data Reviewed: I have personally reviewed following labs and imaging studies  CBC: Recent Labs  Lab 01/07/24 2134 01/07/24 2145 01/08/24 0424 01/11/24 2300 01/12/24 0528 01/13/24 0818  WBC 3.4*  --  7.3 6.7 6.0 9.1  NEUTROABS 2.6  --   --   --   --   --   HGB 10.1* 11.6* 10.3* 11.1* 10.9* 9.4*  HCT 32.1* 34.0* 32.2* 35.8* 34.8* 29.2*  MCV 99.4  --  97.3 95.7 96.1 96.1  PLT 127*  --  115* 111* 113* 114*   Basic Metabolic Panel: Recent Labs  Lab 01/08/24 0424 01/11/24 2300 01/12/24 0528 01/13/24 0818 01/13/24 0819  NA 140 139 140 140 140  K 4.1 3.4* 3.2* 3.9 3.9  CL 109 103 103 106 106  CO2 20* 21* 21* 22 22  GLUCOSE 129* 92 138* 94 94  BUN 19 18 17  31* 30*  CREATININE 0.93 1.09* 0.91 1.05* 1.01*  CALCIUM  8.3* 9.1 9.0 8.5* 8.5*  MG 1.7  --  2.2 2.1  --   PHOS 3.8  --   --   --  2.9   GFR: Estimated Creatinine Clearance: 54.9 mL/min (A) (by C-G formula based on SCr of 1.01 mg/dL (H)). Liver Function Tests: Recent Labs  Lab 01/07/24 2134 01/11/24 2300 01/13/24 0819  AST 20 23  --   ALT 11 8  --   ALKPHOS 58 70  --   BILITOT 0.6 0.5  --   PROT 6.7 7.2  --   ALBUMIN 3.2* 3.8 3.6   No results for input(s): LIPASE, AMYLASE in the last 168 hours. No results for input(s): AMMONIA in the last 168 hours. Coagulation Profile: No results for input(s): INR, PROTIME in the last 168 hours. Cardiac Enzymes: No results for input(s):  CKTOTAL, CKMB, CKMBINDEX, TROPONINI in the last 168 hours. BNP (last 3 results) Recent Labs    01/11/24 2300  PROBNP 3,648.0*   HbA1C: No results for input(s): HGBA1C in the last 72 hours. CBG: No results for input(s): GLUCAP in the last 168 hours. Lipid Profile: No results for input(s): CHOL, HDL, LDLCALC, TRIG, CHOLHDL, LDLDIRECT in the last 72 hours. Thyroid  Function Tests: No results for input(s): TSH, T4TOTAL, FREET4, T3FREE, THYROIDAB in the last 72 hours. Anemia Panel: No results for input(s): VITAMINB12, FOLATE, FERRITIN, TIBC, IRON, RETICCTPCT in the last 72 hours. Urine analysis:  Component Value Date/Time   COLORURINE YELLOW 01/12/2024 0124   APPEARANCEUR HAZY (A) 01/12/2024 0124   LABSPEC 1.011 01/12/2024 0124   PHURINE 5.0 01/12/2024 0124   GLUCOSEU NEGATIVE 01/12/2024 0124   HGBUR SMALL (A) 01/12/2024 0124   BILIRUBINUR NEGATIVE 01/12/2024 0124   KETONESUR NEGATIVE 01/12/2024 0124   PROTEINUR 30 (A) 01/12/2024 0124   UROBILINOGEN 1.0 11/15/2010 2015   NITRITE NEGATIVE 01/12/2024 0124   LEUKOCYTESUR SMALL (A) 01/12/2024 0124   Sepsis Labs: @LABRCNTIP (procalcitonin:4,lacticidven:4)  ) Recent Results (from the past 240 hours)  Culture, blood (Routine X 2) w Reflex to ID Panel     Status: Abnormal   Collection Time: 01/07/24  9:30 PM   Specimen: BLOOD RIGHT ARM  Result Value Ref Range Status   Specimen Description BLOOD RIGHT ARM  Final   Special Requests   Final    BOTTLES DRAWN AEROBIC AND ANAEROBIC Blood Culture results may not be optimal due to an inadequate volume of blood received in culture bottles   Culture  Setup Time   Final    GRAM POSITIVE RODS IN BOTH AEROBIC AND ANAEROBIC BOTTLES CRITICAL VALUE NOTED.  VALUE IS CONSISTENT WITH PREVIOUSLY REPORTED AND CALLED VALUE.    Culture (A)  Final    CORYNEBACTERIUM, GROUP JK Standardized susceptibility testing for this organism is not  available. Performed at [facility], [address] [place], [place] [address]    Report Status 01/11/2024 FINAL  Final  Resp panel by RT-PCR (RSV, Flu A&B, Covid) Anterior Nasal Swab     Status: None   Collection Time: 01/07/24  9:31 PM   Specimen: Anterior Nasal Swab  Result Value Ref Range Status   SARS Coronavirus 2 by RT PCR NEGATIVE NEGATIVE Final   Influenza A by PCR NEGATIVE NEGATIVE Final   Influenza B by PCR NEGATIVE NEGATIVE Final    Comment: (NOTE) The Xpert Xpress SARS-CoV-2/FLU/RSV plus assay is intended as an aid in the diagnosis of influenza from Nasopharyngeal swab specimens and should not be used as a sole basis for treatment. Nasal washings and aspirates are unacceptable for Xpert Xpress SARS-CoV-2/FLU/RSV testing.  Fact Sheet for Patients: BloggerCourse.com  Fact Sheet for Healthcare Providers: SeriousBroker.it  This test is not yet approved or cleared by the [place]  FDA and has been authorized for detection and/or diagnosis of SARS-CoV-2 by FDA under an Emergency Use Authorization (EUA). This EUA will remain in effect (meaning this test can be used) for the duration of the COVID-19 declaration under Section 564(b)(1) of the Act, 21 U.S.C. section 360bbb-3(b)(1), unless the authorization is terminated or revoked.     Resp Syncytial Virus by PCR NEGATIVE NEGATIVE Final    Comment: (NOTE) Fact Sheet for Patients: BloggerCourse.com  Fact Sheet for Healthcare Providers: SeriousBroker.it  This test is not yet approved or cleared by the [place]  FDA and has been authorized for detection and/or diagnosis of SARS-CoV-2 by FDA under an Emergency Use Authorization (EUA). This EUA will remain in effect (meaning this test can be used) for the duration of the COVID-19 declaration under Section 564(b)(1) of the Act, 21 U.S.C. section 360bbb-3(b)(1),  unless the authorization is terminated or revoked.  Performed at [facility], [address] [place], [place] [address]   Respiratory (~20 pathogens) panel by PCR     Status: None   Collection Time: 01/07/24  9:31 PM   Specimen: Nasopharyngeal Swab; Respiratory  Result Value Ref Range Status   Adenovirus NOT DETECTED NOT DETECTED Final  Coronavirus 229E NOT DETECTED NOT DETECTED Final    Comment: (NOTE) The Coronavirus on the Respiratory Panel, DOES NOT test for the novel  Coronavirus (2019 nCoV)    Coronavirus HKU1 NOT DETECTED NOT DETECTED Final   Coronavirus NL63 NOT DETECTED NOT DETECTED Final   Coronavirus OC43 NOT DETECTED NOT DETECTED Final   Metapneumovirus NOT DETECTED NOT DETECTED Final   Rhinovirus / Enterovirus NOT DETECTED NOT DETECTED Final   Influenza A NOT DETECTED NOT DETECTED Final   Influenza B NOT DETECTED NOT DETECTED Final   Parainfluenza Virus 1 NOT DETECTED NOT DETECTED Final   Parainfluenza Virus 2 NOT DETECTED NOT DETECTED Final   Parainfluenza Virus 3 NOT DETECTED NOT DETECTED Final   Parainfluenza Virus 4 NOT DETECTED NOT DETECTED Final   Respiratory Syncytial Virus NOT DETECTED NOT DETECTED Final   Bordetella pertussis NOT DETECTED NOT DETECTED Final   Bordetella Parapertussis NOT DETECTED NOT DETECTED Final   Chlamydophila pneumoniae NOT DETECTED NOT DETECTED Final   Mycoplasma pneumoniae NOT DETECTED NOT DETECTED Final    Comment: Performed at [facility], [address], [place], [place] [address]  Culture, blood (Routine X 2) w Reflex to ID Panel     Status: Abnormal   Collection Time: 01/07/24  9:34 PM   Specimen: BLOOD RIGHT ARM  Result Value Ref Range Status   Specimen Description BLOOD RIGHT ARM  Final   Special Requests   Final    BOTTLES DRAWN AEROBIC AND ANAEROBIC Blood Culture results may not be optimal due to an inadequate volume of blood received in culture bottles   Culture  Setup Time   Final    GRAM POSITIVE  COCCI GRAM POSITIVE RODS BOTTLES DRAWN AEROBIC ONLY CRITICAL RESULT CALLED TO, READ BACK BY AND VERIFIED WITH: PHARMD DOROTHA BARLOW [id number] @ 2128 FH    Culture (A)  Final    AEROCOCCUS VIRIDANS BACILLUS SPECIES Standardized susceptibility testing for this organism is not available. Performed at [facility], [address] [place], [place] [address]    Report Status 01/11/2024 FINAL  Final  Blood Culture ID Panel (Reflexed)     Status: None   Collection Time: 01/07/24  9:34 PM  Result Value Ref Range Status   Enterococcus faecalis NOT DETECTED NOT DETECTED Final   Enterococcus Faecium NOT DETECTED NOT DETECTED Final   Listeria monocytogenes NOT DETECTED NOT DETECTED Final   Staphylococcus species NOT DETECTED NOT DETECTED Final   Staphylococcus aureus (BCID) NOT DETECTED NOT DETECTED Final   Staphylococcus epidermidis NOT DETECTED NOT DETECTED Final   Staphylococcus lugdunensis NOT DETECTED NOT DETECTED Final   Streptococcus species NOT DETECTED NOT DETECTED Final   Streptococcus agalactiae NOT DETECTED NOT DETECTED Final   Streptococcus pneumoniae NOT DETECTED NOT DETECTED Final   Streptococcus pyogenes NOT DETECTED NOT DETECTED Final   A.calcoaceticus-baumannii NOT DETECTED NOT DETECTED Final   Bacteroides fragilis NOT DETECTED NOT DETECTED Final   Enterobacterales NOT DETECTED NOT DETECTED Final   Enterobacter cloacae complex NOT DETECTED NOT DETECTED Final   Escherichia coli NOT DETECTED NOT DETECTED Final   Klebsiella aerogenes NOT DETECTED NOT DETECTED Final   Klebsiella oxytoca NOT DETECTED NOT DETECTED Final   Klebsiella pneumoniae NOT DETECTED NOT DETECTED Final   Proteus species NOT DETECTED NOT DETECTED Final   Salmonella species NOT DETECTED NOT DETECTED Final   Serratia marcescens NOT DETECTED NOT DETECTED Final   Haemophilus influenzae NOT DETECTED NOT DETECTED Final   Neisseria meningitidis NOT DETECTED NOT DETECTED  Final   Pseudomonas aeruginosa NOT DETECTED  NOT DETECTED Final   Stenotrophomonas maltophilia NOT DETECTED NOT DETECTED Final   Candida albicans NOT DETECTED NOT DETECTED Final   Candida auris NOT DETECTED NOT DETECTED Final   Candida glabrata NOT DETECTED NOT DETECTED Final   Candida krusei NOT DETECTED NOT DETECTED Final   Candida parapsilosis NOT DETECTED NOT DETECTED Final   Candida tropicalis NOT DETECTED NOT DETECTED Final   Cryptococcus neoformans/gattii NOT DETECTED NOT DETECTED Final    Comment: Performed at [facility], [address] [place], [place] [address]  Urine Culture     Status: Abnormal   Collection Time: 01/08/24  4:19 AM   Specimen: Urine, Random  Result Value Ref Range Status   Specimen Description URINE, RANDOM  Final   Special Requests   Final    NONE Reflexed from [id number] Performed at [facility], [address], [place], [place] [address]    Culture >=100,000 COLONIES/mL ESCHERICHIA COLI (A)  Final   Report Status 01/10/2024 FINAL  Final   Organism ID, Bacteria ESCHERICHIA COLI (A)  Final      Susceptibility   Escherichia coli - MIC*    AMPICILLIN 8 SENSITIVE Sensitive     CEFAZOLIN (URINE) Value in next row Sensitive      2 SENSITIVEThis is a modified FDA-approved test that has been validated and its performance characteristics determined by the reporting laboratory.  This laboratory is certified under the Clinical Laboratory Improvement Amendments CLIA as qualified to perform high complexity clinical laboratory testing.    CEFEPIME Value in next row Sensitive      2 SENSITIVEThis is a modified FDA-approved test that has been validated and its performance characteristics determined by the reporting laboratory.  This laboratory is certified under the Clinical Laboratory Improvement Amendments CLIA as qualified to perform high complexity clinical laboratory testing.    ERTAPENEM Value in next row Sensitive      2 SENSITIVEThis is a modified FDA-approved test that has been validated and its  performance characteristics determined by the reporting laboratory.  This laboratory is certified under the Clinical Laboratory Improvement Amendments CLIA as qualified to perform high complexity clinical laboratory testing.    CEFTRIAXONE Value in next row Sensitive      2 SENSITIVEThis is a modified FDA-approved test that has been validated and its performance characteristics determined by the reporting laboratory.  This laboratory is certified under the Clinical Laboratory Improvement Amendments CLIA as qualified to perform high complexity clinical laboratory testing.    CIPROFLOXACIN Value in next row Sensitive      2 SENSITIVEThis is a modified FDA-approved test that has been validated and its performance characteristics determined by the reporting laboratory.  This laboratory is certified under the Clinical Laboratory Improvement Amendments CLIA as qualified to perform high complexity clinical laboratory testing.    GENTAMICIN Value in next row Sensitive      2 SENSITIVEThis is a modified FDA-approved test that has been validated and its performance characteristics determined by the reporting laboratory.  This laboratory is certified under the Clinical Laboratory Improvement Amendments CLIA as qualified to perform high complexity clinical laboratory testing.    NITROFURANTOIN Value in next row Sensitive      2 SENSITIVEThis is a modified FDA-approved test that has been validated and its performance characteristics determined by the reporting laboratory.  This laboratory is certified under the Clinical Laboratory Improvement Amendments CLIA as qualified to perform high complexity clinical laboratory testing.  TRIMETH/SULFA Value in next row Sensitive      2 SENSITIVEThis is a modified FDA-approved test that has been validated and its performance characteristics determined by the reporting laboratory.  This laboratory is certified under the Clinical Laboratory Improvement Amendments CLIA as qualified  to perform high complexity clinical laboratory testing.    AMPICILLIN/SULBACTAM Value in next row Sensitive      2 SENSITIVEThis is a modified FDA-approved test that has been validated and its performance characteristics determined by the reporting laboratory.  This laboratory is certified under the Clinical Laboratory Improvement Amendments CLIA as qualified to perform high complexity clinical laboratory testing.    PIP/TAZO Value in next row Sensitive ug/mL     <=4 SENSITIVEThis is a modified FDA-approved test that has been validated and its performance characteristics determined by the reporting laboratory.  This laboratory is certified under the Clinical Laboratory Improvement Amendments CLIA as qualified to perform high complexity clinical laboratory testing.    MEROPENEM Value in next row Sensitive      <=4 SENSITIVEThis is a modified FDA-approved test that has been validated and its performance characteristics determined by the reporting laboratory.  This laboratory is certified under the Clinical Laboratory Improvement Amendments CLIA as qualified to perform high complexity clinical laboratory testing.    * >=100,000 COLONIES/mL ESCHERICHIA COLI  Resp panel by RT-PCR (RSV, Flu A&B, Covid) Anterior Nasal Swab     Status: None   Collection Time: 01/11/24 11:00 PM   Specimen: Anterior Nasal Swab  Result Value Ref Range Status   SARS Coronavirus 2 by RT PCR NEGATIVE NEGATIVE Final    Comment: (NOTE) SARS-CoV-2 target nucleic acids are NOT DETECTED.  The SARS-CoV-2 RNA is generally detectable in upper respiratory specimens during the acute phase of infection. The lowest concentration of SARS-CoV-2 viral copies this assay can detect is 138 copies/mL. A negative result does not preclude SARS-Cov-2 infection and should not be used as the sole basis for treatment or other patient management decisions. A negative result may occur with  improper specimen collection/handling, submission of specimen  other than nasopharyngeal swab, presence of viral mutation(s) within the areas targeted by this assay, and inadequate number of viral copies(<138 copies/mL). A negative result must be combined with clinical observations, patient history, and epidemiological information. The expected result is Negative.  Fact Sheet for Patients:  BloggerCourse.com  Fact Sheet for Healthcare Providers:  SeriousBroker.it  This test is no t yet approved or cleared by the [place]  FDA and  has been authorized for detection and/or diagnosis of SARS-CoV-2 by FDA under an Emergency Use Authorization (EUA). This EUA will remain  in effect (meaning this test can be used) for the duration of the COVID-19 declaration under Section 564(b)(1) of the Act, 21 U.S.C.section 360bbb-3(b)(1), unless the authorization is terminated  or revoked sooner.       Influenza A by PCR NEGATIVE NEGATIVE Final   Influenza B by PCR NEGATIVE NEGATIVE Final    Comment: (NOTE) The Xpert Xpress SARS-CoV-2/FLU/RSV plus assay is intended as an aid in the diagnosis of influenza from Nasopharyngeal swab specimens and should not be used as a sole basis for treatment. Nasal washings and aspirates are unacceptable for Xpert Xpress SARS-CoV-2/FLU/RSV testing.  Fact Sheet for Patients: BloggerCourse.com  Fact Sheet for Healthcare Providers: SeriousBroker.it  This test is not yet approved or cleared by the [place]  FDA and has been authorized for detection and/or diagnosis of SARS-CoV-2 by FDA under an Emergency Use Authorization (EUA). This EUA will  remain in effect (meaning this test can be used) for the duration of the COVID-19 declaration under Section 564(b)(1) of the Act, 21 U.S.C. section 360bbb-3(b)(1), unless the authorization is terminated or revoked.     Resp Syncytial Virus by PCR NEGATIVE NEGATIVE Final     Comment: (NOTE) Fact Sheet for Patients: BloggerCourse.com  Fact Sheet for Healthcare Providers: SeriousBroker.it  This test is not yet approved or cleared by the [place]  FDA and has been authorized for detection and/or diagnosis of SARS-CoV-2 by FDA under an Emergency Use Authorization (EUA). This EUA will remain in effect (meaning this test can be used) for the duration of the COVID-19 declaration under Section 564(b)(1) of the Act, 21 U.S.C. section 360bbb-3(b)(1), unless the authorization is terminated or revoked.  Performed at [facility], [address], [place], [place] [address]   Urine Culture     Status: Abnormal (Preliminary result)   Collection Time: 01/12/24  1:24 AM   Specimen: Urine, Random  Result Value Ref Range Status   Specimen Description   Final    URINE, RANDOM Performed at [facility], [address], [place], [place] [address]    Special Requests   Final    NONE Reflexed from [phone] [id number] Performed at [facility], [address] [place], [place] [address]    Culture (A)  Final    >=100,000 COLONIES/mL ESCHERICHIA COLI SUSCEPTIBILITIES TO FOLLOW Performed at [facility], [address] [place], [place] [address]    Report Status PENDING  Incomplete         Radiology Studies: ECHOCARDIOGRAM COMPLETE Result Date: 01/12/2024    ECHOCARDIOGRAM REPORT   Patient Name:   GREIDYS DELAND Date of Exam: 01/12/2024 Medical Rec #:  [id number]   Height:       70.0 in Accession #:    [id number]  Weight:       202.6 lb Date of Birth:  [birth year]  BSA:          2.099 m Patient Age:    77 years    BP:           143/98 mmHg Patient Gender: F           HR:           59 bpm. Exam Location:  Inpatient Procedure: Cardiac Doppler, Color Doppler and Intracardiac Opacification Agent            (Both Spectral and Color Flow Doppler were utilized during             procedure). Indications:    CHF  History:        Patient has prior history of Echocardiogram examinations, most                 recent 09/12/2020. CHF, COPD; Risk Factors:Hypertension and                 Dyslipidemia.  Sonographer:    Philomena Daring Referring Phys: [id number] TIMOTHY S OPYD IMPRESSIONS  1. Left ventricular ejection fraction, by estimation, is 60 to 65%. The left ventricle has normal function. The left ventricle has no regional wall motion abnormalities. Left ventricular diastolic parameters are indeterminate.  2. Right ventricular systolic function is normal. The right ventricular size is normal. Tricuspid regurgitation signal is inadequate for assessing PA pressure.  3. The mitral valve is grossly normal. No evidence of mitral valve regurgitation. No evidence of mitral stenosis.  4. The aortic valve  is tricuspid. Aortic valve regurgitation is not visualized. Aortic valve sclerosis is present, with no evidence of aortic valve stenosis.  5. The inferior vena cava is normal in size with greater than 50% respiratory variability, suggesting right atrial pressure of 3 mmHg. Conclusion(s)/Recommendation(s): No left ventricular mural or apical thrombus/thrombi. FINDINGS  Left Ventricle: Left ventricular ejection fraction, by estimation, is 60 to 65%. The left ventricle has normal function. The left ventricle has no regional wall motion abnormalities. Definity  contrast agent was given IV to delineate the left ventricular  endocardial borders. The left ventricular internal cavity size was normal in size. There is no left ventricular hypertrophy. Left ventricular diastolic parameters are indeterminate. Right Ventricle: The right ventricular size is normal. No increase in right ventricular wall thickness. Right ventricular systolic function is normal. Tricuspid regurgitation signal is inadequate for assessing PA pressure. Left Atrium: Left atrial size was normal in size. Right Atrium: Right atrial size was normal in  size. Pericardium: There is no evidence of pericardial effusion. Mitral Valve: The mitral valve is grossly normal. There is mild thickening of the mitral valve leaflet(s). There is mild calcification of the mitral valve leaflet(s). Mild mitral annular calcification. No evidence of mitral valve regurgitation. No evidence of mitral valve stenosis. Tricuspid Valve: The tricuspid valve is grossly normal. Tricuspid valve regurgitation is not demonstrated. No evidence of tricuspid stenosis. Aortic Valve: The aortic valve is tricuspid. Aortic valve regurgitation is not visualized. Aortic valve sclerosis is present, with no evidence of aortic valve stenosis. Pulmonic Valve: The pulmonic valve was grossly normal. Pulmonic valve regurgitation is not visualized. No evidence of pulmonic stenosis. Aorta: The aortic root and ascending aorta are structurally normal, with no evidence of dilitation. Venous: The inferior vena cava is normal in size with greater than 50% respiratory variability, suggesting right atrial pressure of 3 mmHg. IAS/Shunts: The atrial septum is grossly normal.  LEFT VENTRICLE PLAX 2D LVIDd:         5.40 cm   Diastology LVIDs:         4.40 cm   LV e' lateral:   3.70 cm/s LV PW:         1.10 cm   LV E/e' lateral: 14.1 LV IVS:        1.20 cm LVOT diam:     2.10 cm LV SV:         73 LV SV Index:   35 LVOT Area:     3.46 cm  RIGHT VENTRICLE             IVC RV S prime:     17.50 cm/s  IVC diam: 1.60 cm TAPSE (M-mode): 2.8 cm LEFT ATRIUM             Index        RIGHT ATRIUM           Index LA diam:        3.40 cm 1.62 cm/m   RA Area:     15.80 cm LA Vol (A2C):   39.5 ml 18.82 ml/m  RA Volume:   41.50 ml  19.77 ml/m LA Vol (A4C):   55.8 ml 26.59 ml/m LA Biplane Vol: 51.4 ml 24.49 ml/m  AORTIC VALVE LVOT Vmax:   96.70 cm/s LVOT Vmean:  62.200 cm/s LVOT VTI:    0.210 m  AORTA Ao Root diam: 3.00 cm Ao Asc diam:  3.50 cm MITRAL VALVE MV Area (PHT): 2.04 cm     SHUNTS MV Decel Time: 372  msec     Systemic VTI:   0.21 m MV E velocity: 52.10 cm/s   Systemic Diam: 2.10 cm MV A velocity: 109.00 cm/s MV E/A ratio:  0.48 Sunit Tolia Electronically signed by Madonna Large Signature Date/Time: 01/12/2024/6:35:47 PM    Final    VAS US  LOWER EXTREMITY VENOUS (DVT) Result Date: 01/12/2024  Lower Venous DVT Study Patient Name:  RADIAH LUBINSKI  Date of Exam:   01/12/2024 Medical Rec #: [id number]    Accession #:    [id number] Date of Birth: 31-May-1945   Patient Gender: F Patient Age:   65 years Exam Location:  [facility] Procedure:      VAS US  LOWER EXTREMITY VENOUS (DVT) Referring Phys: EVALENE OPYD --------------------------------------------------------------------------------  Indications: Edema, SOB, and Patient discharged 01/09/2024 after presenting with shortness of breath. Patient states she has not taken any of her medication since discharge. History of COPD and CHF (BNP 3000 this admission).  Comparison Study: Prior right LEV done 12/05/23 Performing Technologist: Alberta Lis RVS  Examination Guidelines: A complete evaluation includes B-mode imaging, spectral Doppler, color Doppler, and power Doppler as needed of all accessible portions of each vessel. Bilateral testing is considered an integral part of a complete examination. Limited examinations for reoccurring indications may be performed as noted. The reflux portion of the exam is performed with the patient in reverse Trendelenburg.  +---------+---------------+---------+-----------+----------+--------------+ RIGHT    CompressibilityPhasicitySpontaneityPropertiesThrombus Aging +---------+---------------+---------+-----------+----------+--------------+ CFV      Partial        Yes      No                   Chronic        +---------+---------------+---------+-----------+----------+--------------+ SFJ      Full                                                        +---------+---------------+---------+-----------+----------+--------------+ FV Prox   Partial        Yes      No                   Chronic        +---------+---------------+---------+-----------+----------+--------------+ FV Mid   Partial        Yes      No                   Chronic        +---------+---------------+---------+-----------+----------+--------------+ FV DistalPartial        Yes      No                   Chronic        +---------+---------------+---------+-----------+----------+--------------+ PFV      Partial        Yes      No                   Chronic        +---------+---------------+---------+-----------+----------+--------------+ POP      Partial        No       Yes                  Chronic        +---------+---------------+---------+-----------+----------+--------------+ PTV      Partial  Chronic        +---------+---------------+---------+-----------+----------+--------------+ PERO     Partial                                      Chronic        +---------+---------------+---------+-----------+----------+--------------+   +---------+---------------+---------+-----------+----------+--------------+ LEFT     CompressibilityPhasicitySpontaneityPropertiesThrombus Aging +---------+---------------+---------+-----------+----------+--------------+ CFV      Full           Yes      Yes                                 +---------+---------------+---------+-----------+----------+--------------+ SFJ      Full                                                        +---------+---------------+---------+-----------+----------+--------------+ FV Prox  Full                                                        +---------+---------------+---------+-----------+----------+--------------+ FV Mid   Full                                                        +---------+---------------+---------+-----------+----------+--------------+ FV DistalFull                                                         +---------+---------------+---------+-----------+----------+--------------+ PFV      Full           Yes      No                                  +---------+---------------+---------+-----------+----------+--------------+ POP      Full           Yes      No                                  +---------+---------------+---------+-----------+----------+--------------+ PTV      Full                                                        +---------+---------------+---------+-----------+----------+--------------+ PERO     Full                                                        +---------+---------------+---------+-----------+----------+--------------+  Summary: RIGHT: - Findings consistent with chronic deep vein thrombosis involving the right common femoral vein, right femoral vein, right proximal profunda vein, right popliteal vein, right posterior tibial veins, and right peroneal veins.  - Findings appear essentially unchanged compared to previous examination. - No cystic structure found in the popliteal fossa.  LEFT: - There is no evidence of deep vein thrombosis in the lower extremity.  - No cystic structure found in the popliteal fossa.  *See table(s) above for measurements and observations.    Preliminary    CT Angio Chest PE W and/or Wo Contrast Result Date: 01/12/2024 CLINICAL DATA:  Shortness of breath. EXAM: CT ANGIOGRAPHY CHEST WITH CONTRAST TECHNIQUE: Multidetector CT imaging of the chest was performed using the standard protocol during bolus administration of intravenous contrast. Multiplanar CT image reconstructions and MIPs were obtained to evaluate the vascular anatomy. RADIATION DOSE REDUCTION: This exam was performed according to the departmental dose-optimization program which includes automated exposure control, adjustment of the mA and/or kV according to patient size and/or use of iterative reconstruction technique. CONTRAST:  75mL OMNIPAQUE  IOHEXOL   350 MG/ML SOLN COMPARISON:  01/07/2024 FINDINGS: Cardiovascular: The heart size is upper normal to borderline enlarged. No substantial pericardial effusion. Coronary artery calcification is evident. Moderate atherosclerotic calcification is noted in the wall of the thoracic aorta. Stable appearance of the focal outpouching from the inferior wall of the transverse aorta suggesting ulcerated plaque or penetrating ulcer. Previously described dilation of the distal transverse/proximal descending thoracic aorta up to 3.6 cm, stable. There is no filling defect within the opacified pulmonary arteries to suggest the presence of an acute pulmonary embolus. Mediastinum/Nodes: Similar mild mediastinal lymphadenopathy including 11 mm short axis prevascular node on 37/4 and 11 mm short axis subcarinal node on 42/4. Small hiatal hernia. The esophagus has normal imaging features. There is no axillary lymphadenopathy. Lungs/Pleura: Centrilobular emphsyema noted. Chronic atelectasis/scarring noted superior segment left lower lobe. Stable 10 mm subpleural nodule left lower lobe on 108/6. Calcified pleural plaques noted left hemithorax. Upper Abdomen: Visualized portion of the upper abdomen shows no acute findings. Musculoskeletal: No worrisome lytic or sclerotic osseous abnormality. Review of the MIP images confirms the above findings. IMPRESSION: 1. No CT evidence for acute pulmonary embolus. 2. 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 Recommend annual imaging followup by CTA or MRA. This recommendation follows 2010 ACCF/AHA/AATS/ACR/ASA/SCA/SCAI/SIR/STS/SVM Guidelines for the Diagnosis and Management of Patients with Thoracic Aortic Disease. Circulation. 2010; 121: Z733-z630. Aortic aneurysm NOS (ICD10-I71.9) 3. No change in focal outpouching from the inferior wall of the transverse aorta suggesting ulcerated plaque or penetrating ulcer. 4. Stable 10 mm subpleural nodule left lower lobe. Attention on follow-up  recommended. 5. Stable mild mediastinal lymphadenopathy. 6. Small hiatal hernia. 7. Aortic Atherosclerosis (ICD10-I70.0) and Emphysema (ICD10-J43.9). Electronically Signed   By: Camellia Candle M.D.   On: 01/12/2024 05:09   DG Chest 2 View Result Date: 01/11/2024 CLINICAL DATA:  SOB EXAM: CHEST - 2 VIEW COMPARISON:  CT angio chest 01/07/2024, chest x-ray 01/07/2024 FINDINGS: The heart and mediastinal contours are unchanged. Atherosclerotic plaque. Persistent left lower to mid lung zone airspace opacity. No pulmonary edema. Persistent blunting of the left costophrenic angle with calcified pleural plaques. No definite left pleural effusion. No right pleural effusion. No pneumothorax. No acute osseous abnormality. IMPRESSION: 1. Persistent left lower to mid lung zone airspace opacity. Finding better evaluated on CT angiography chest 01/07/2024 2. Persistent blunting of the left costophrenic angle with calcified pleural plaques. No definite left pleural effusion.  Electronically Signed   By: Morgane  Naveau M.D.   On: 01/11/2024 23:48        Scheduled Meds:  amLODipine   10 mg Oral Daily   furosemide   40 mg Intravenous Q12H   heparin   5,000 Units Subcutaneous Q8H   hydrALAZINE   50 mg Oral Q8H   pantoprazole   40 mg Oral Daily   polyethylene glycol  17 g Oral Daily   rosuvastatin   20 mg Oral Daily   sodium chloride  flush  3 mL Intravenous Q12H   Continuous Infusions:   LOS: 1 day    Time spent: 35 minutes    Leatrice Chapel, MD  [facility] Pager #: [phone] 7PM-7AM contact night coverage as above

## 2024-01-14 DIAGNOSIS — I5033 Acute on chronic diastolic (congestive) heart failure: Secondary | ICD-10-CM | POA: Diagnosis not present

## 2024-01-14 LAB — CULTURE, BLOOD (ROUTINE X 2)

## 2024-01-14 LAB — BASIC METABOLIC PANEL WITH GFR
Anion gap: 13 (ref 5–15)
BUN: 25 mg/dL — ABNORMAL HIGH (ref 8–23)
CO2: 23 mmol/L (ref 22–32)
Calcium: 8.9 mg/dL (ref 8.9–10.3)
Chloride: 105 mmol/L (ref 98–111)
Creatinine, Ser: 0.87 mg/dL (ref 0.44–1.00)
GFR, Estimated: >60 mL/min (ref >60)
Glucose, Bld: 87 mg/dL (ref 70–99)
Potassium: 3.5 mmol/L (ref 3.5–5.1)
Sodium: 141 mmol/L (ref 135–145)

## 2024-01-14 LAB — CBC
HCT: 31.9 % — ABNORMAL LOW (ref 36.0–46.0)
Hemoglobin: 9.7 g/dL — ABNORMAL LOW (ref 12.0–15.0)
MCH: 29.9 pg (ref 26.0–34.0)
MCHC: 30.4 g/dL (ref 30.0–36.0)
MCV: 98.5 fL (ref 80.0–100.0)
Platelets: 120 K/uL — ABNORMAL LOW (ref 150–400)
RBC: 3.24 MIL/uL — ABNORMAL LOW (ref 3.87–5.11)
RDW: 15.4 % (ref 11.5–15.5)
WBC: 8.3 K/uL (ref 4.0–10.5)
nRBC: 0 % (ref 0.0–0.2)

## 2024-01-14 LAB — URINE CULTURE: Culture: >=100000 — AB

## 2024-01-14 MED ORDER — POTASSIUM CHLORIDE 20 MEQ PO PACK
40.0000 meq | PACK | Freq: Once | ORAL | Status: AC
  Administered 2024-01-14: 40 meq via ORAL
  Filled 2024-01-14: qty 2

## 2024-01-14 NOTE — Plan of Care (Signed)
   Problem: Coping: Goal: Level of anxiety will decrease Outcome: Progressing   Problem: Safety: Goal: Ability to remain free from injury will improve Outcome: Progressing   Problem: Skin Integrity: Goal: Risk for impaired skin integrity will decrease Outcome: Progressing

## 2024-01-14 NOTE — Evaluation (Signed)
 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 As such, her occupational performance is compromised and he requires assist for self-care management. During the session, she required mod assist for supine to sit, mod assist to step-pivot to and from the bedside commode using a RW, and max assist for toileting at bedside commode level. She reported a history of several falls, and stated one time she fell 5 times in one day. She has L sided weakness as a result of a CVA from ~3 years ago; she stated her L leg tends to lock up. She will benefit from further OT services to maximize her safety and independence with self-care tasks and to decrease the risk for additional falls and restricted participation in meaningful activities. Patient will benefit from continued inpatient follow up therapy, <3 hours/day.      If plan is discharge home, recommend the following:   A lot of help with walking and/or transfers;A lot of help with bathing/dressing/bathroom;Assist for transportation;Assistance with cooking/housework     Functional Status Assessment   Patient has had a recent decline in their functional status and demonstrates the ability to make significant improvements in function in a reasonable and predictable amount of time.     Equipment Recommendations   None recommended by OT     Recommendations for Other Services          Precautions/Restrictions   Precautions Precautions: Fall Restrictions Weight Bearing Restrictions Per Provider Order: No     Mobility Bed Mobility Overal bed mobility: Needs Assistance Bed Mobility: Supine to Sit     Supine to sit: Mod assist, HOB elevated, Used rails          Transfers Overall transfer level: Needs assistance Equipment used: Rolling walker (2 wheels) Transfers: Sit to/from Stand, Bed to chair/wheelchair/BSC Sit to Stand: Mod assist, From elevated surface     Step pivot transfers: Mod assist, From elevated surface            Balance     Sitting balance-Leahy Scale: Fair       Standing balance-Leahy Scale: Poor          ADL either performed or assessed with clinical judgement   ADL Overall ADL's : Needs assistance/impaired Eating/Feeding: Set up;Sitting Eating/Feeding Details (indicate cue type and reason): assist needed to remove lids, open packets and cut food, as she is limited by decreased L hand strength with impaired fine motor coordination Grooming: Minimal assistance;Sitting           Upper Body Dressing : Moderate assistance;Sitting Upper Body Dressing Details (indicate cue type and reason): She required assist to doff a hospital gown, then to donn another clean one in sitting at chair level Lower Body Dressing: Maximal assistance;Sitting/lateral leans;Sit to/from stand   Toilet Transfer: Moderate assistance;Stand-pivot;Rolling walker (2 wheels);Cueing for sequencing Toilet Transfer Details (indicate cue type and reason): Pt required cues to advance LLE (weakness noted), for walker placement, and to reach back prior to sitting. Toileting- Clothing Manipulation and Hygiene: Maximal  assistance;Cueing for sequencing;Sit to/from stand Toileting - Clothing Manipulation Details (indicate cue type and reason): Toileting management performed at bedside commode level. Pt was unable to void, therefore hygiene was noted assessed. She  required assist for balance in standing and assist for clothing management.                          Pertinent Vitals/Pain Pain Assessment Pain Assessment: No/denies pain     Extremity/Trunk Assessment Upper Extremity Assessment Upper Extremity Assessment: RUE deficits/detail;LUE deficits/detail RUE Deficits / Details: AROM and strength WFL. Grip strength 4/5 LUE Deficits / Details:  (Weakness and impaired fine motor coordination from a prior CVA. Required AAROM for shoulder. Elbow and hand AROM WFL. grip strength 3+/5) LUE Coordination: decreased fine motor   Lower Extremity Assessment LLE Deficits / Details: generalized weakness, residual from previous stroke, difficulty advancing in step pvt transfer LLE Coordination: decreased gross motor       Communication     Cognition Arousal: Alert Behavior During Therapy: Flat affect Cognition: No family/caregiver present to determine baseline             OT - Cognition Comments: Oriented to person, place, month and year. Slightly delayed initiation. Required occasional repetition of prompts.              Following commands impaired: Follows one step commands with increased time     Cueing  General Comments   Cueing Techniques: Tactile cues;Gestural cues;Verbal cues              Home Living Family/patient expects to be discharged to:: Private residence Living Arrangements: Children (daughter and grandson)   Type of Home: Apartment Home Access: Stairs to enter Secretary/administrator of Steps: 2-3 Entrance Stairs-Rails: Left Home Layout: Two level;Able to live on main level with bedroom/bathroom     Bathroom Shower/Tub: Tub/shower unit         Home Equipment: Pharmacist, hospital (2 wheels);Wheelchair - manual          Prior Functioning/Environment Prior Level of Function : Needs assist             Mobility Comments:  (She reported needing CGA to min assist with household  ambulation using a RW.) ADLs Comments:  (Pt reported needing assistance with ambulating to and from the bathroom, as well as assist for bathing. Her daughter performed the cooking and cleaning. They use [employer] for transportation needs.)    OT Problem List: Decreased strength;Decreased range of motion;Decreased activity tolerance;Impaired balance (sitting and/or standing);Decreased coordination;Decreased safety awareness;Decreased knowledge of use of DME or AE;Obesity   OT Treatment/Interventions: Self-care/ADL training;Therapeutic exercise;Neuromuscular education;DME and/or AE instruction;Therapeutic activities;Cognitive remediation/compensation;Patient/family education;Balance training;Energy conservation      OT Goals(Current goals can be found in the care plan section)   Acute Rehab OT Goals OT Goal Formulation: With patient Time For Goal Achievement: 01/28/24 Potential to Achieve Goals: Good ADL Goals Pt Will Perform Upper Body Dressing: with set-up;sitting Pt Will Perform Lower Body Dressing: with contact guard assist;sitting/lateral leans;sit to/from stand Pt Will Transfer to Toilet: with contact guard assist;ambulating Pt Will Perform Toileting - Clothing Manipulation and hygiene: with contact guard assist;sit to/from stand   OT Frequency:  Min 2X/week       AM-PAC OT 6 Clicks Daily Activity     Outcome Measure Help from another person eating meals?: A Little Help from another person taking care of personal grooming?: A Little Help from another person toileting, which includes  using toliet, bedpan, or urinal?: A Lot Help from another person bathing (including washing, rinsing, drying)?: A Lot Help from another person to put on and taking off regular upper body clothing?: A Little Help from another person to put on and taking off regular lower body clothing?: A Lot 6 Click Score: 15   End of Session Equipment Utilized During Treatment: Gait belt;Rolling walker (2  wheels) Nurse Communication: Mobility status  Activity Tolerance: Patient tolerated treatment well Patient left: in chair;with call bell/phone within reach;with chair alarm set  OT Visit Diagnosis: Unsteadiness on feet (R26.81);Other abnormalities of gait and mobility (R26.89);Muscle weakness (generalized) (M62.81);History of falling (Z91.81);Hemiplegia and hemiparesis Hemiplegia - Right/Left: Left Hemiplegia - dominant/non-dominant: Non-Dominant                Time: 8679-8648 OT Time Calculation (min): 31 min Charges:  OT General Charges $OT Visit: 1 Visit OT Evaluation $OT Eval Moderate Complexity: 1 Mod OT Treatments $Self Care/Home Management : 8-22 mins    Delanna JINNY Lesches, OTR/L 01/14/2024, 3:24 PM

## 2024-01-14 NOTE — Progress Notes (Signed)
 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  Patient did not take any of her medications since she was discharged and has developed recurrent shortness of breath.  proBNP was elevated (3,648).  Patient is on IV Lasix  40 Mg every 12.  Shortness of breath has improved significantly CTA chest was negative for PE.  PT has recommended SNF.  01/12/2024: Shortness of breath is slowly improving.  Continue current regimen. 01/14/2024: Patient seen.  Patient continues to improve.  Physical therapy input is highly appreciated, SNF is recommended.   Assessment & Plan:   Principal Problem:   Acute on chronic heart failure with preserved ejection fraction (HFpEF) (HCC) Active Problems:   COPD (chronic obstructive pulmonary disease) (HCC)   Dyslipidemia   History of stroke   Essential hypertension   Hypokalemia   History of DVT (deep vein thrombosis)   Physical debility   Elevated troponin   Positive D dimer   1. Acute on chronic HFpEF  - Continue diuresis with IV Lasix , monitor weight and I/Os, update echocardiogram 01/13/2024: Improved significantly.   01/14/2024: Pursue disposition.   2. Elevated D-dimer  - D-dimer >20  - She is hemodynamically stable and does not have phlegmasia  - Check CTA chest and LE venous Dopplers   01/13/2024: CTA chest was negative for pulmonary embolism.   3. Elevated troponin   - Troponin is 41 without chest pain  - Trend troponin, follow-up on chest CTA and echocardiogram findings    4. COPD  - Not in exacerbation  - Continue  short-acting bronchodilators as-needed    5. Hypertension  - Continue Norvasc  and hydralazine    6. Hx of CVA  - Crestor      7. Physical debility; frequent falls  - Consult PT     8. Hypokalemia  - Replacing    9. Anemia; thrombocytopenia  - Bone marrow biopsy from 12/13/23 did not reveal etiology and she is planning to follow-up with hematology/oncology, Dr. Lanny  - Counts appear stable with no overt bleeding    DVT prophylaxis: Subcu heparin  Code Status: Full code Family Communication:  Disposition Plan: Inpatient   Consultants:  None.  Procedures:  None.  Antimicrobials:  None   Subjective: Shortness of breath has improved significantly  Objective: Vitals:   01/14/24 0007 01/14/24 0503 01/14/24 0918 01/14/24 1434  BP: 138/63 139/61 (!) 125/55 114/60  Pulse: (!) 55 (!) 56 62 71  Resp:  18  20  Temp:  97.7 F (36.5 C)  98.3 F (36.8 C)  TempSrc:  Oral  Oral  SpO2:  96%  100%  Weight:      Height:        Intake/Output Summary (Last 24 hours) at 01/14/2024 1914 Last data filed at 01/14/2024 1827 Gross per 24 hour  Intake 363 ml  Output 1400 ml  Net -1037 ml   Filed Weights   01/13/24 0701  Weight: 89.8 kg    Examination:  General exam: Appears calm and comfortable  Respiratory system: Clear to auscultation. Respiratory effort normal. Cardiovascular system: S1 & S2 heard Gastrointestinal system: Abdomen is  soft and nontender.  Central nervous system: Alert and oriented. No focal neurological deficits. Extremities: Fullness of the ankle  Data Reviewed: I have personally reviewed following labs and imaging studies  CBC: Recent Labs  Lab 01/07/24 2134 01/07/24 2145 01/08/24 0424 01/11/24 2300 01/12/24 0528 01/13/24 0818 01/14/24 0401  WBC 3.4*  --  7.3 6.7 6.0 9.1 8.3  NEUTROABS 2.6  --   --   --   --   --   --   HGB 10.1*   < > 10.3* 11.1* 10.9* 9.4* 9.7*  HCT 32.1*   < > 32.2* 35.8* 34.8* 29.2* 31.9*  MCV 99.4  --  97.3 95.7 96.1 96.1  98.5  PLT 127*  --  115* 111* 113* 114* 120*   < > = values in this interval not displayed.   Basic Metabolic Panel: Recent Labs  Lab 01/08/24 0424 01/11/24 2300 01/12/24 0528 01/13/24 0818 01/13/24 0819 01/14/24 0401  NA 140 139 140 140 140 141  K 4.1 3.4* 3.2* 3.9 3.9 3.5  CL 109 103 103 106 106 105  CO2 20* 21* 21* 22 22 23   GLUCOSE 129* 92 138* 94 94 87  BUN 19 18 17  31* 30* 25*  CREATININE 0.93 1.09* 0.91 1.05* 1.01* 0.87  CALCIUM  8.3* 9.1 9.0 8.5* 8.5* 8.9  MG 1.7  --  2.2 2.1  --   --   PHOS 3.8  --   --   --  2.9  --    GFR: Estimated Creatinine Clearance: 63.7 mL/min (by C-G formula based on SCr of 0.87 mg/dL). Liver Function Tests: Recent Labs  Lab 01/07/24 2134 01/11/24 2300 01/13/24 0819  AST 20 23  --   ALT 11 8  --   ALKPHOS 58 70  --   BILITOT 0.6 0.5  --   PROT 6.7 7.2  --   ALBUMIN 3.2* 3.8 3.6   No results for input(s): LIPASE, AMYLASE in the last 168 hours. No results for input(s): AMMONIA in the last 168 hours. Coagulation Profile: No results for input(s): INR, PROTIME in the last 168 hours. Cardiac Enzymes: No results for input(s): CKTOTAL, CKMB, CKMBINDEX, TROPONINI in the last 168 hours. BNP (last 3 results) Recent Labs    01/11/24 2300  PROBNP 3,648.0*   HbA1C: No results for input(s): HGBA1C in the last 72 hours. CBG: No results for input(s): GLUCAP in the last 168 hours. Lipid Profile: No results for input(s): CHOL, HDL, LDLCALC, TRIG, CHOLHDL, LDLDIRECT in the last 72 hours. Thyroid  Function Tests: No results for input(s): TSH, T4TOTAL, FREET4, T3FREE, THYROIDAB in the last 72 hours. Anemia Panel: No results for input(s): VITAMINB12, FOLATE, FERRITIN, TIBC, IRON, RETICCTPCT in the last 72 hours. Urine analysis:    Component Value Date/Time   COLORURINE YELLOW 01/12/2024 0124   APPEARANCEUR HAZY (A) 01/12/2024 0124   LABSPEC 1.011 01/12/2024 0124   PHURINE 5.0  01/12/2024 0124   GLUCOSEU NEGATIVE 01/12/2024 0124   HGBUR SMALL (A) 01/12/2024 0124   BILIRUBINUR NEGATIVE 01/12/2024 0124   KETONESUR NEGATIVE 01/12/2024 0124   PROTEINUR 30 (A) 01/12/2024 0124   UROBILINOGEN 1.0 11/15/2010 2015   NITRITE NEGATIVE 01/12/2024 0124   LEUKOCYTESUR SMALL (A) 01/12/2024 0124   Sepsis Labs: @LABRCNTIP (procalcitonin:4,lacticidven:4)  ) Recent Results (from the past 240 hours)  Culture, blood (Routine X 2) w Reflex to ID Panel     Status: Abnormal   Collection Time: 01/07/24  9:30 PM   Specimen: BLOOD RIGHT ARM  Result Value Ref  Range Status   Specimen Description BLOOD RIGHT ARM  Final   Special Requests   Final    BOTTLES DRAWN AEROBIC AND ANAEROBIC Blood Culture results may not be optimal due to an inadequate volume of blood received in culture bottles   Culture  Setup Time   Final    GRAM POSITIVE RODS IN BOTH AEROBIC AND ANAEROBIC BOTTLES CRITICAL VALUE NOTED.  VALUE IS CONSISTENT WITH PREVIOUSLY REPORTED AND CALLED VALUE.    Culture (A)  Final    CORYNEBACTERIUM, GROUP JK CUTIBACTERIUM ACNES Standardized susceptibility testing for this organism is not available. Performed at [facility], [address] [place], [place] [address]    Report Status 01/14/2024 FINAL  Final  Resp panel by RT-PCR (RSV, Flu A&B, Covid) Anterior Nasal Swab     Status: None   Collection Time: 01/07/24  9:31 PM   Specimen: Anterior Nasal Swab  Result Value Ref Range Status   SARS Coronavirus 2 by RT PCR NEGATIVE NEGATIVE Final   Influenza A by PCR NEGATIVE NEGATIVE Final   Influenza B by PCR NEGATIVE NEGATIVE Final    Comment: (NOTE) The Xpert Xpress SARS-CoV-2/FLU/RSV plus assay is intended as an aid in the diagnosis of influenza from Nasopharyngeal swab specimens and should not be used as a sole basis for treatment. Nasal washings and aspirates are unacceptable for Xpert Xpress SARS-CoV-2/FLU/RSV testing.  Fact Sheet for  Patients: BloggerCourse.com  Fact Sheet for Healthcare Providers: SeriousBroker.it  This test is not yet approved or cleared by the [place]  FDA and has been authorized for detection and/or diagnosis of SARS-CoV-2 by FDA under an Emergency Use Authorization (EUA). This EUA will remain in effect (meaning this test can be used) for the duration of the COVID-19 declaration under Section 564(b)(1) of the Act, 21 U.S.C. section 360bbb-3(b)(1), unless the authorization is terminated or revoked.     Resp Syncytial Virus by PCR NEGATIVE NEGATIVE Final    Comment: (NOTE) Fact Sheet for Patients: BloggerCourse.com  Fact Sheet for Healthcare Providers: SeriousBroker.it  This test is not yet approved or cleared by the [place]  FDA and has been authorized for detection and/or diagnosis of SARS-CoV-2 by FDA under an Emergency Use Authorization (EUA). This EUA will remain in effect (meaning this test can be used) for the duration of the COVID-19 declaration under Section 564(b)(1) of the Act, 21 U.S.C. section 360bbb-3(b)(1), unless the authorization is terminated or revoked.  Performed at [facility], [address] [place], [place] [address]   Respiratory (~20 pathogens) panel by PCR     Status: None   Collection Time: 01/07/24  9:31 PM   Specimen: Nasopharyngeal Swab; Respiratory  Result Value Ref Range Status   Adenovirus NOT DETECTED NOT DETECTED Final   Coronavirus 229E NOT DETECTED NOT DETECTED Final    Comment: (NOTE) The Coronavirus on the Respiratory Panel, DOES NOT test for the novel  Coronavirus (2019 nCoV)    Coronavirus HKU1 NOT DETECTED NOT DETECTED Final   Coronavirus NL63 NOT DETECTED NOT DETECTED Final   Coronavirus OC43 NOT DETECTED NOT DETECTED Final   Metapneumovirus NOT DETECTED NOT DETECTED Final   Rhinovirus / Enterovirus NOT DETECTED NOT  DETECTED Final   Influenza A NOT DETECTED NOT DETECTED Final   Influenza B NOT DETECTED NOT DETECTED Final   Parainfluenza Virus 1 NOT DETECTED NOT DETECTED Final   Parainfluenza Virus 2 NOT DETECTED NOT DETECTED Final   Parainfluenza Virus 3 NOT DETECTED NOT DETECTED Final  Parainfluenza Virus 4 NOT DETECTED NOT DETECTED Final   Respiratory Syncytial Virus NOT DETECTED NOT DETECTED Final   Bordetella pertussis NOT DETECTED NOT DETECTED Final   Bordetella Parapertussis NOT DETECTED NOT DETECTED Final   Chlamydophila pneumoniae NOT DETECTED NOT DETECTED Final   Mycoplasma pneumoniae NOT DETECTED NOT DETECTED Final    Comment: Performed at [facility], [address] [place], [place] [address]  Culture, blood (Routine X 2) w Reflex to ID Panel     Status: Abnormal   Collection Time: 01/07/24  9:34 PM   Specimen: BLOOD RIGHT ARM  Result Value Ref Range Status   Specimen Description BLOOD RIGHT ARM  Final   Special Requests   Final    BOTTLES DRAWN AEROBIC AND ANAEROBIC Blood Culture results may not be optimal due to an inadequate volume of blood received in culture bottles   Culture  Setup Time   Final    GRAM POSITIVE COCCI GRAM POSITIVE RODS BOTTLES DRAWN AEROBIC ONLY CRITICAL RESULT CALLED TO, READ BACK BY AND VERIFIED WITH: PHARMD DOROTHA BARLOW [id number] @ 2128 FH    Culture (A)  Final    AEROCOCCUS VIRIDANS BACILLUS SPECIES Standardized susceptibility testing for this organism is not available. Performed at [facility], [address] [place], [place] [address]    Report Status 01/11/2024 FINAL  Final  Blood Culture ID Panel (Reflexed)     Status: None   Collection Time: 01/07/24  9:34 PM  Result Value Ref Range Status   Enterococcus faecalis NOT DETECTED NOT DETECTED Final   Enterococcus Faecium NOT DETECTED NOT DETECTED Final   Listeria monocytogenes NOT DETECTED NOT DETECTED Final   Staphylococcus species NOT DETECTED NOT DETECTED Final   Staphylococcus aureus  (BCID) NOT DETECTED NOT DETECTED Final   Staphylococcus epidermidis NOT DETECTED NOT DETECTED Final   Staphylococcus lugdunensis NOT DETECTED NOT DETECTED Final   Streptococcus species NOT DETECTED NOT DETECTED Final   Streptococcus agalactiae NOT DETECTED NOT DETECTED Final   Streptococcus pneumoniae NOT DETECTED NOT DETECTED Final   Streptococcus pyogenes NOT DETECTED NOT DETECTED Final   A.calcoaceticus-baumannii NOT DETECTED NOT DETECTED Final   Bacteroides fragilis NOT DETECTED NOT DETECTED Final   Enterobacterales NOT DETECTED NOT DETECTED Final   Enterobacter cloacae complex NOT DETECTED NOT DETECTED Final   Escherichia coli NOT DETECTED NOT DETECTED Final   Klebsiella aerogenes NOT DETECTED NOT DETECTED Final   Klebsiella oxytoca NOT DETECTED NOT DETECTED Final   Klebsiella pneumoniae NOT DETECTED NOT DETECTED Final   Proteus species NOT DETECTED NOT DETECTED Final   Salmonella species NOT DETECTED NOT DETECTED Final   Serratia marcescens NOT DETECTED NOT DETECTED Final   Haemophilus influenzae NOT DETECTED NOT DETECTED Final   Neisseria meningitidis NOT DETECTED NOT DETECTED Final   Pseudomonas aeruginosa NOT DETECTED NOT DETECTED Final   Stenotrophomonas maltophilia NOT DETECTED NOT DETECTED Final   Candida albicans NOT DETECTED NOT DETECTED Final   Candida auris NOT DETECTED NOT DETECTED Final   Candida glabrata NOT DETECTED NOT DETECTED Final   Candida krusei NOT DETECTED NOT DETECTED Final   Candida parapsilosis NOT DETECTED NOT DETECTED Final   Candida tropicalis NOT DETECTED NOT DETECTED Final   Cryptococcus neoformans/gattii NOT DETECTED NOT DETECTED Final    Comment: Performed at [facility], [address] [place], [place] [address]  Urine Culture     Status: Abnormal   Collection Time: 01/08/24  4:19 AM   Specimen: Urine, Random  Result Value  Ref Range Status   Specimen Description URINE, RANDOM  Final   Special Requests   Final    NONE Reflexed from  [id number] Performed at [facility], [address] [place], [place] [address]    Culture >=100,000 COLONIES/mL ESCHERICHIA COLI (A)  Final   Report Status 01/10/2024 FINAL  Final   Organism ID, Bacteria ESCHERICHIA COLI (A)  Final      Susceptibility   Escherichia coli - MIC*    AMPICILLIN 8 SENSITIVE Sensitive     CEFAZOLIN (URINE) Value in next row Sensitive      2 SENSITIVEThis is a modified FDA-approved test that has been validated and its performance characteristics determined by the reporting laboratory.  This laboratory is certified under the Clinical Laboratory Improvement Amendments CLIA as qualified to perform high complexity clinical laboratory testing.    CEFEPIME Value in next row Sensitive      2 SENSITIVEThis is a modified FDA-approved test that has been validated and its performance characteristics determined by the reporting laboratory.  This laboratory is certified under the Clinical Laboratory Improvement Amendments CLIA as qualified to perform high complexity clinical laboratory testing.    ERTAPENEM Value in next row Sensitive      2 SENSITIVEThis is a modified FDA-approved test that has been validated and its performance characteristics determined by the reporting laboratory.  This laboratory is certified under the Clinical Laboratory Improvement Amendments CLIA as qualified to perform high complexity clinical laboratory testing.    CEFTRIAXONE Value in next row Sensitive      2 SENSITIVEThis is a modified FDA-approved test that has been validated and its performance characteristics determined by the reporting laboratory.  This laboratory is certified under the Clinical Laboratory Improvement Amendments CLIA as qualified to perform high complexity clinical laboratory testing.    CIPROFLOXACIN Value in next row Sensitive      2 SENSITIVEThis is a modified FDA-approved test that has been validated and its performance characteristics determined by the reporting laboratory.   This laboratory is certified under the Clinical Laboratory Improvement Amendments CLIA as qualified to perform high complexity clinical laboratory testing.    GENTAMICIN Value in next row Sensitive      2 SENSITIVEThis is a modified FDA-approved test that has been validated and its performance characteristics determined by the reporting laboratory.  This laboratory is certified under the Clinical Laboratory Improvement Amendments CLIA as qualified to perform high complexity clinical laboratory testing.    NITROFURANTOIN Value in next row Sensitive      2 SENSITIVEThis is a modified FDA-approved test that has been validated and its performance characteristics determined by the reporting laboratory.  This laboratory is certified under the Clinical Laboratory Improvement Amendments CLIA as qualified to perform high complexity clinical laboratory testing.    TRIMETH/SULFA Value in next row Sensitive      2 SENSITIVEThis is a modified FDA-approved test that has been validated and its performance characteristics determined by the reporting laboratory.  This laboratory is certified under the Clinical Laboratory Improvement Amendments CLIA as qualified to perform high complexity clinical laboratory testing.    AMPICILLIN/SULBACTAM Value in next row Sensitive      2 SENSITIVEThis is a modified FDA-approved test that has been validated and its performance characteristics determined by the reporting laboratory.  This laboratory is certified under the Clinical Laboratory Improvement Amendments CLIA as qualified to perform high complexity clinical laboratory testing.    PIP/TAZO Value in next row Sensitive ug/mL     <=4  SENSITIVEThis is a modified FDA-approved test that has been validated and its performance characteristics determined by the reporting laboratory.  This laboratory is certified under the Clinical Laboratory Improvement Amendments CLIA as qualified to perform high complexity clinical laboratory testing.     MEROPENEM Value in next row Sensitive      <=4 SENSITIVEThis is a modified FDA-approved test that has been validated and its performance characteristics determined by the reporting laboratory.  This laboratory is certified under the Clinical Laboratory Improvement Amendments CLIA as qualified to perform high complexity clinical laboratory testing.    * >=100,000 COLONIES/mL ESCHERICHIA COLI  Resp panel by RT-PCR (RSV, Flu A&B, Covid) Anterior Nasal Swab     Status: None   Collection Time: 01/11/24 11:00 PM   Specimen: Anterior Nasal Swab  Result Value Ref Range Status   SARS Coronavirus 2 by RT PCR NEGATIVE NEGATIVE Final    Comment: (NOTE) SARS-CoV-2 target nucleic acids are NOT DETECTED.  The SARS-CoV-2 RNA is generally detectable in upper respiratory specimens during the acute phase of infection. The lowest concentration of SARS-CoV-2 viral copies this assay can detect is 138 copies/mL. A negative result does not preclude SARS-Cov-2 infection and should not be used as the sole basis for treatment or other patient management decisions. A negative result may occur with  improper specimen collection/handling, submission of specimen other than nasopharyngeal swab, presence of viral mutation(s) within the areas targeted by this assay, and inadequate number of viral copies(<138 copies/mL). A negative result must be combined with clinical observations, patient history, and epidemiological information. The expected result is Negative.  Fact Sheet for Patients:  BloggerCourse.com  Fact Sheet for Healthcare Providers:  SeriousBroker.it  This test is no t yet approved or cleared by the [place]  FDA and  has been authorized for detection and/or diagnosis of SARS-CoV-2 by FDA under an Emergency Use Authorization (EUA). This EUA will remain  in effect (meaning this test can be used) for the duration of the COVID-19 declaration under  Section 564(b)(1) of the Act, 21 U.S.C.section 360bbb-3(b)(1), unless the authorization is terminated  or revoked sooner.       Influenza A by PCR NEGATIVE NEGATIVE Final   Influenza B by PCR NEGATIVE NEGATIVE Final    Comment: (NOTE) The Xpert Xpress SARS-CoV-2/FLU/RSV plus assay is intended as an aid in the diagnosis of influenza from Nasopharyngeal swab specimens and should not be used as a sole basis for treatment. Nasal washings and aspirates are unacceptable for Xpert Xpress SARS-CoV-2/FLU/RSV testing.  Fact Sheet for Patients: BloggerCourse.com  Fact Sheet for Healthcare Providers: SeriousBroker.it  This test is not yet approved or cleared by the [place]  FDA and has been authorized for detection and/or diagnosis of SARS-CoV-2 by FDA under an Emergency Use Authorization (EUA). This EUA will remain in effect (meaning this test can be used) for the duration of the COVID-19 declaration under Section 564(b)(1) of the Act, 21 U.S.C. section 360bbb-3(b)(1), unless the authorization is terminated or revoked.     Resp Syncytial Virus by PCR NEGATIVE NEGATIVE Final    Comment: (NOTE) Fact Sheet for Patients: BloggerCourse.com  Fact Sheet for Healthcare Providers: SeriousBroker.it  This test is not yet approved or cleared by the [place]  FDA and has been authorized for detection and/or diagnosis of SARS-CoV-2 by FDA under an Emergency Use Authorization (EUA). This EUA will remain in effect (meaning this test can be used) for the duration of the COVID-19 declaration under Section 564(b)(1) of the Act, 21 U.S.C. section  360bbb-3(b)(1), unless the authorization is terminated or revoked.  Performed at [facility], [address], [place], [place] [address]   Urine Culture     Status: Abnormal   Collection Time: 01/12/24  1:24 AM   Specimen: Urine,  Random  Result Value Ref Range Status   Specimen Description   Final    URINE, RANDOM Performed at [facility], [address], [place], [place] [address]    Special Requests   Final    NONE Reflexed from [id number] Performed at [facility], [address] [place], [place] [address]    Culture >=100,000 COLONIES/mL ESCHERICHIA COLI (A)  Final   Report Status 01/14/2024 FINAL  Final   Organism ID, Bacteria ESCHERICHIA COLI (A)  Final      Susceptibility   Escherichia coli - MIC*    AMPICILLIN 4 SENSITIVE Sensitive     CEFAZOLIN (URINE) Value in next row Sensitive      2 SENSITIVEThis is a modified FDA-approved test that has been validated and its performance characteristics determined by the reporting laboratory.  This laboratory is certified under the Clinical Laboratory Improvement Amendments CLIA as qualified to perform high complexity clinical laboratory testing.    CEFEPIME Value in next row Sensitive      2 SENSITIVEThis is a modified FDA-approved test that has been validated and its performance characteristics determined by the reporting laboratory.  This laboratory is certified under the Clinical Laboratory Improvement Amendments CLIA as qualified to perform high complexity clinical laboratory testing.    ERTAPENEM Value in next row Sensitive      2 SENSITIVEThis is a modified FDA-approved test that has been validated and its performance characteristics determined by the reporting laboratory.  This laboratory is certified under the Clinical Laboratory Improvement Amendments CLIA as qualified to perform high complexity clinical laboratory testing.    CEFTRIAXONE Value in next row Sensitive      2 SENSITIVEThis is a modified FDA-approved test that has been validated and its performance characteristics determined by the reporting laboratory.  This laboratory is certified under the Clinical Laboratory Improvement Amendments CLIA as qualified to perform  high complexity clinical laboratory testing.    CIPROFLOXACIN Value in next row Sensitive      2 SENSITIVEThis is a modified FDA-approved test that has been validated and its performance characteristics determined by the reporting laboratory.  This laboratory is certified under the Clinical Laboratory Improvement Amendments CLIA as qualified to perform high complexity clinical laboratory testing.    GENTAMICIN Value in next row Sensitive      2 SENSITIVEThis is a modified FDA-approved test that has been validated and its performance characteristics determined by the reporting laboratory.  This laboratory is certified under the Clinical Laboratory Improvement Amendments CLIA as qualified to perform high complexity clinical laboratory testing.    NITROFURANTOIN Value in next row Sensitive      2 SENSITIVEThis is a modified FDA-approved test that has been validated and its performance characteristics determined by the reporting laboratory.  This laboratory is certified under the Clinical Laboratory Improvement Amendments CLIA as qualified to perform high complexity clinical laboratory testing.    TRIMETH/SULFA Value in next row Sensitive      2 SENSITIVEThis is a modified FDA-approved test that has been validated and its performance characteristics determined by the reporting laboratory.  This laboratory is certified under the Clinical Laboratory Improvement Amendments CLIA as qualified to perform high complexity clinical laboratory testing.    AMPICILLIN/SULBACTAM  Value in next row Sensitive      2 SENSITIVEThis is a modified FDA-approved test that has been validated and its performance characteristics determined by the reporting laboratory.  This laboratory is certified under the Clinical Laboratory Improvement Amendments CLIA as qualified to perform high complexity clinical laboratory testing.    PIP/TAZO Value in next row Sensitive ug/mL     <=4 SENSITIVEThis is a modified FDA-approved test that has  been validated and its performance characteristics determined by the reporting laboratory.  This laboratory is certified under the Clinical Laboratory Improvement Amendments CLIA as qualified to perform high complexity clinical laboratory testing.    MEROPENEM Value in next row Sensitive      <=4 SENSITIVEThis is a modified FDA-approved test that has been validated and its performance characteristics determined by the reporting laboratory.  This laboratory is certified under the Clinical Laboratory Improvement Amendments CLIA as qualified to perform high complexity clinical laboratory testing.    * >=100,000 COLONIES/mL ESCHERICHIA COLI         Radiology Studies: No results found.       Scheduled Meds:  amLODipine   10 mg Oral Daily   furosemide   40 mg Intravenous Q12H   heparin   5,000 Units Subcutaneous Q8H   hydrALAZINE   50 mg Oral Q8H   pantoprazole   40 mg Oral Daily   polyethylene glycol  17 g Oral Daily   rosuvastatin   20 mg Oral Daily   sodium chloride  flush  3 mL Intravenous Q12H   Continuous Infusions:   LOS: 2 days    Time spent: 35 minutes    Leatrice Chapel, MD  [facility] Pager #: [phone] 7PM-7AM contact night coverage as above

## 2024-01-15 ENCOUNTER — Encounter: Admitting: Family

## 2024-01-15 ENCOUNTER — Telehealth: Payer: Self-pay | Admitting: General Practice

## 2024-01-15 DIAGNOSIS — I5033 Acute on chronic diastolic (congestive) heart failure: Secondary | ICD-10-CM | POA: Diagnosis not present

## 2024-01-15 MED ORDER — IBUPROFEN 200 MG PO TABS
200.0000 mg | ORAL_TABLET | Freq: Once | ORAL | Status: AC
  Administered 2024-01-15: 200 mg via ORAL
  Filled 2024-01-15: qty 1

## 2024-01-15 NOTE — Progress Notes (Signed)
 Erroneous encounter-disregard

## 2024-01-15 NOTE — TOC Initial Note (Signed)
 Transition of Care [facility]) - Initial/Assessment Note   Patient Details  Name: [name] MRN: [id number] Date of Birth: 1945/08/04  [facility] CM/SW Contact:    Duwaine GORMAN Aran, LCSW Phone Number: 01/15/2024, 2:28 PM  Clinical Narrative: PT/OT evaluations recommended SNF. CSW met with patient to discuss recommendations. Patient politely declined SNF reporting she had been to one recently and would be prefer to go home. Patient agreeable to being set up with HHPT/OT and reported she has a walker, wheelchair, and BSC at home. Patient also confirmed she does not have a legal guardian. Per chart review, patient was recently set up with [facility]. CSW confirmed with Burnard with [facility]. Hospitalist placed HHPT/OT orders. Care management to follow.  Expected Discharge Plan: Home w Home Health Services Barriers to Discharge: Continued Medical Work up  Patient Goals and CMS Choice Patient states their goals for this hospitalization and ongoing recovery are:: Return home CMS Medicare.gov Compare Post Acute Care list provided to:: Patient Choice offered to / list presented to : Patient  Expected Discharge Plan and Services In-house Referral: Clinical Social Work Post Acute Care Choice: Home Health Living arrangements for the past 2 months: Skilled Nursing Facility           DME Arranged: N/A DME Agency: NA HH Arranged: Patient Refused HH, PT, OT HH Agency: [facility] Date [facility] Agency Contacted: 01/15/24 Time HH Agency Contacted: 1415 Representative spoke with at [facility] Agency: Burnard  Prior Living Arrangements/Services Living arrangements for the past 2 months: Skilled Nursing Facility Lives with:: Relatives Patient language and need for interpreter reviewed:: Yes Do you feel safe going back to the place where you live?: Yes      Need for Family Participation in Patient Care: No (Comment) Current home services: DME (Has wheelchair, walker, and BSC) Criminal  Activity/Legal Involvement Pertinent to Current Situation/Hospitalization: No - Comment as needed  Activities of Daily Living ADL Screening (condition at time of admission) Independently performs ADLs?: No Does the patient have a NEW difficulty with bathing/dressing/toileting/self-feeding that is expected to last >3 days?: Yes (Initiates electronic notice to provider for possible OT consult) Does the patient have a NEW difficulty with getting in/out of bed, walking, or climbing stairs that is expected to last >3 days?: Yes (Initiates electronic notice to provider for possible PT consult) Does the patient have a NEW difficulty with communication that is expected to last >3 days?: No Is the patient deaf or have difficulty hearing?: Yes Does the patient have difficulty seeing, even when wearing glasses/contacts?: Yes Does the patient have difficulty concentrating, remembering, or making decisions?: Yes  Permission Sought/Granted Permission granted to share information with : Yes, Verbal Permission Granted Permission granted to share info w AGENCY: HH agency  Emotional Assessment Appearance:: Appears stated age Attitude/Demeanor/Rapport: Engaged Affect (typically observed): Pleasant, Accepting Orientation: : Oriented to Self, Oriented to Place, Oriented to  Time, Oriented to Situation Alcohol / Substance Use: Not Applicable Psych Involvement: No (comment)  Admission diagnosis:  Acute respiratory failure with hypoxia (HCC) [J96.01] Acute on chronic congestive heart failure, unspecified heart failure type [facility]) [I50.9] Patient Active Problem List   Diagnosis Date Noted   Acute on chronic heart failure with preserved ejection fraction (HFpEF) (HCC) 01/12/2024   Physical debility 01/12/2024   Elevated troponin 01/12/2024   Positive D dimer 01/12/2024   History of DVT (deep vein thrombosis)    Hypokalemia 01/10/2024   UTI (urinary tract infection) 01/09/2024   Pressure injury of skin  01/09/2024  Acute hypoxic respiratory failure (HCC) 01/08/2024   Dehydration 01/08/2024   Viral pneumonia 01/08/2024   Normocytic anemia 12/11/2023   Low folate 12/11/2023   Right shoulder pain    Acute blood loss anemia    Essential hypertension    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   Intraventricular hemorrhage (HCC) 09/16/2020   History of stroke 09/16/2020   COPD (chronic obstructive pulmonary disease) (HCC)    Dyslipidemia    PCP:  Patient, No Pcp Per Pharmacy:   [id number] - [place], [place] - [address] AT [address] [place] [address] Phone: [phone] Fax: [phone]  Social Drivers of Health (SDOH) Social History: SDOH Screenings   Food Insecurity: No Food Insecurity (01/12/2024)  Housing: Low Risk  (01/12/2024)  Transportation Needs: Unmet Transportation Needs (01/12/2024)  Utilities: Not At Risk (01/12/2024)  Depression (PHQ2-9): Low Risk  (06/04/2021)  Social Connections: Socially Isolated (01/12/2024)  Tobacco Use: Medium Risk (01/12/2024)   SDOH Interventions:    Readmission Risk Interventions    01/15/2024    2:24 PM  Readmission Risk Prevention Plan  Transportation Screening Complete  HRI or Home Care Consult Complete  Social Work Consult for Recovery Care Planning/Counseling Complete  Palliative Care Screening Not Applicable  Medication Review [employer]) Complete

## 2024-01-15 NOTE — Progress Notes (Signed)
 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  Patient did not take any of her medications since she was discharged and has developed recurrent shortness of breath.  proBNP was elevated (3,648).  Patient is on IV Lasix  40 Mg every 12.  Shortness of breath has improved significantly CTA chest was negative for PE.  PT has recommended SNF.  01/12/2024: Shortness of breath is slowly improving.  Continue current regimen. 01/14/2024: Patient seen.  Patient continues to improve.  Physical therapy input is highly appreciated, SNF is recommended. 01/15/2024: Shortness of breath is stable.  Patient reports being constipated.  For trial of enema.  Pursue disposition.   Assessment & Plan:   Principal Problem:   Acute on chronic heart failure with preserved ejection fraction (HFpEF) (HCC) Active Problems:   COPD (chronic obstructive pulmonary disease) (HCC)   Dyslipidemia   History of stroke   Essential hypertension   Hypokalemia   History of DVT (deep vein thrombosis)   Physical debility   Elevated troponin   Positive D dimer   1. Acute on chronic HFpEF  - Continue diuresis with IV Lasix , monitor weight and I/Os, update echocardiogram 01/13/2024: Improved significantly.   01/15/2024: Pursue disposition.   2. Elevated D-dimer  - D-dimer >20  - She is hemodynamically stable and does not have phlegmasia  - Check CTA chest and LE venous Dopplers   01/13/2024: CTA chest was negative for pulmonary embolism.   3. Elevated troponin   - Troponin is 41 without chest pain  - Trend  troponin, follow-up on chest CTA and echocardiogram findings    4. COPD  - Not in exacerbation  - Continue short-acting bronchodilators as-needed    5. Hypertension  - Continue Norvasc  and hydralazine    6. Hx of CVA  - Crestor      7. Physical debility; frequent falls  - Consult PT     8. Hypokalemia  - Replacing    9. Anemia; thrombocytopenia  - Bone marrow biopsy from 12/13/23 did not reveal etiology and she is planning to follow-up with hematology/oncology, Dr. Lanny  - Counts appear stable with no overt bleeding   Constipation: - Trial of enema.  DVT prophylaxis: Subcu heparin  Code Status: Full code Family Communication:  Disposition Plan: Inpatient   Consultants:  None.  Procedures:  None.  Antimicrobials:  None   Subjective: Shortness of breath has improved significantly Patient reports being constipated.  Objective: Vitals:   01/15/24 0449 01/15/24 0500 01/15/24 0926 01/15/24 1334  BP: 125/65  (!) 145/59 (!) 121/59  Pulse: 61  67 68  Resp: 18   20  Temp: 98.4 F (36.9 C)   98 F (36.7 C)  TempSrc: Oral   Axillary  SpO2: 98%   100%  Weight:  85.5 kg    Height:        Intake/Output Summary (Last 24 hours) at 01/15/2024 1538 Last data filed at 01/15/2024 1358 Gross per 24 hour  Intake 240 ml  Output 1300 ml  Net -1060 ml   Filed Weights   01/13/24 0701 01/15/24 0500  Weight: 89.8 kg  85.5 kg    Examination:  General exam: Appears calm and comfortable  Respiratory system: Clear to auscultation. Respiratory effort normal. Cardiovascular system: S1 & S2 heard Gastrointestinal system: Abdomen is soft and nontender.  Central nervous system: Alert and oriented. No focal neurological deficits. Extremities: Fullness of the ankle  Data Reviewed: I have personally reviewed following labs and imaging studies  CBC: Recent Labs  Lab 01/11/24 2300 01/12/24 0528 01/13/24 0818 01/14/24 0401  WBC 6.7 6.0 9.1 8.3  HGB 11.1* 10.9* 9.4* 9.7*  HCT  35.8* 34.8* 29.2* 31.9*  MCV 95.7 96.1 96.1 98.5  PLT 111* 113* 114* 120*   Basic Metabolic Panel: Recent Labs  Lab 01/11/24 2300 01/12/24 0528 01/13/24 0818 01/13/24 0819 01/14/24 0401  NA 139 140 140 140 141  K 3.4* 3.2* 3.9 3.9 3.5  CL 103 103 106 106 105  CO2 21* 21* 22 22 23   GLUCOSE 92 138* 94 94 87  BUN 18 17 31* 30* 25*  CREATININE 1.09* 0.91 1.05* 1.01* 0.87  CALCIUM  9.1 9.0 8.5* 8.5* 8.9  MG  --  2.2 2.1  --   --   PHOS  --   --   --  2.9  --    GFR: Estimated Creatinine Clearance: 62.3 mL/min (by C-G formula based on SCr of 0.87 mg/dL). Liver Function Tests: Recent Labs  Lab 01/11/24 2300 01/13/24 0819  AST 23  --   ALT 8  --   ALKPHOS 70  --   BILITOT 0.5  --   PROT 7.2  --   ALBUMIN 3.8 3.6   No results for input(s): LIPASE, AMYLASE in the last 168 hours. No results for input(s): AMMONIA in the last 168 hours. Coagulation Profile: No results for input(s): INR, PROTIME in the last 168 hours. Cardiac Enzymes: No results for input(s): CKTOTAL, CKMB, CKMBINDEX, TROPONINI in the last 168 hours. BNP (last 3 results) Recent Labs    01/11/24 2300  PROBNP 3,648.0*   HbA1C: No results for input(s): HGBA1C in the last 72 hours. CBG: No results for input(s): GLUCAP in the last 168 hours. Lipid Profile: No results for input(s): CHOL, HDL, LDLCALC, TRIG, CHOLHDL, LDLDIRECT in the last 72 hours. Thyroid  Function Tests: No results for input(s): TSH, T4TOTAL, FREET4, T3FREE, THYROIDAB in the last 72 hours. Anemia Panel: No results for input(s): VITAMINB12, FOLATE, FERRITIN, TIBC, IRON, RETICCTPCT in the last 72 hours. Urine analysis:    Component Value Date/Time   COLORURINE YELLOW 01/12/2024 0124   APPEARANCEUR HAZY (A) 01/12/2024 0124   LABSPEC 1.011 01/12/2024 0124   PHURINE 5.0 01/12/2024 0124   GLUCOSEU NEGATIVE 01/12/2024 0124   HGBUR SMALL (A) 01/12/2024 0124   BILIRUBINUR NEGATIVE  01/12/2024 0124   KETONESUR NEGATIVE 01/12/2024 0124   PROTEINUR 30 (A) 01/12/2024 0124   UROBILINOGEN 1.0 11/15/2010 2015   NITRITE NEGATIVE 01/12/2024 0124   LEUKOCYTESUR SMALL (A) 01/12/2024 0124   Sepsis Labs: @LABRCNTIP (procalcitonin:4,lacticidven:4)  ) Recent Results (from the past 240 hours)  Culture, blood (Routine X 2) w Reflex to ID Panel     Status: Abnormal   Collection Time: 01/07/24  9:30 PM   Specimen: BLOOD RIGHT ARM  Result Value Ref Range Status   Specimen Description BLOOD RIGHT ARM  Final   Special Requests   Final    BOTTLES DRAWN AEROBIC AND ANAEROBIC Blood Culture results may not be optimal due to an inadequate volume of blood received in culture bottles   Culture  Setup Time   Final  GRAM POSITIVE RODS IN BOTH AEROBIC AND ANAEROBIC BOTTLES CRITICAL VALUE NOTED.  VALUE IS CONSISTENT WITH PREVIOUSLY REPORTED AND CALLED VALUE.    Culture (A)  Final    CORYNEBACTERIUM, GROUP JK CUTIBACTERIUM ACNES Standardized susceptibility testing for this organism is not available. Performed at [facility], [address] [place], [place] [address]    Report Status 01/14/2024 FINAL  Final  Resp panel by RT-PCR (RSV, Flu A&B, Covid) Anterior Nasal Swab     Status: None   Collection Time: 01/07/24  9:31 PM   Specimen: Anterior Nasal Swab  Result Value Ref Range Status   SARS Coronavirus 2 by RT PCR NEGATIVE NEGATIVE Final   Influenza A by PCR NEGATIVE NEGATIVE Final   Influenza B by PCR NEGATIVE NEGATIVE Final    Comment: (NOTE) The Xpert Xpress SARS-CoV-2/FLU/RSV plus assay is intended as an aid in the diagnosis of influenza from Nasopharyngeal swab specimens and should not be used as a sole basis for treatment. Nasal washings and aspirates are unacceptable for Xpert Xpress SARS-CoV-2/FLU/RSV testing.  Fact Sheet for Patients: BloggerCourse.com  Fact Sheet for Healthcare  Providers: SeriousBroker.it  This test is not yet approved or cleared by the [place]  FDA and has been authorized for detection and/or diagnosis of SARS-CoV-2 by FDA under an Emergency Use Authorization (EUA). This EUA will remain in effect (meaning this test can be used) for the duration of the COVID-19 declaration under Section 564(b)(1) of the Act, 21 U.S.C. section 360bbb-3(b)(1), unless the authorization is terminated or revoked.     Resp Syncytial Virus by PCR NEGATIVE NEGATIVE Final    Comment: (NOTE) Fact Sheet for Patients: BloggerCourse.com  Fact Sheet for Healthcare Providers: SeriousBroker.it  This test is not yet approved or cleared by the [place]  FDA and has been authorized for detection and/or diagnosis of SARS-CoV-2 by FDA under an Emergency Use Authorization (EUA). This EUA will remain in effect (meaning this test can be used) for the duration of the COVID-19 declaration under Section 564(b)(1) of the Act, 21 U.S.C. section 360bbb-3(b)(1), unless the authorization is terminated or revoked.  Performed at [facility], [address], [place], [place] [address]   Respiratory (~20 pathogens) panel by PCR     Status: None   Collection Time: 01/07/24  9:31 PM   Specimen: Nasopharyngeal Swab; Respiratory  Result Value Ref Range Status   Adenovirus NOT DETECTED NOT DETECTED Final   Coronavirus 229E NOT DETECTED NOT DETECTED Final    Comment: (NOTE) The Coronavirus on the Respiratory Panel, DOES NOT test for the novel  Coronavirus (2019 nCoV)    Coronavirus HKU1 NOT DETECTED NOT DETECTED Final   Coronavirus NL63 NOT DETECTED NOT DETECTED Final   Coronavirus OC43 NOT DETECTED NOT DETECTED Final   Metapneumovirus NOT DETECTED NOT DETECTED Final   Rhinovirus / Enterovirus NOT DETECTED NOT DETECTED Final   Influenza A NOT DETECTED NOT DETECTED Final   Influenza B NOT DETECTED  NOT DETECTED Final   Parainfluenza Virus 1 NOT DETECTED NOT DETECTED Final   Parainfluenza Virus 2 NOT DETECTED NOT DETECTED Final   Parainfluenza Virus 3 NOT DETECTED NOT DETECTED Final   Parainfluenza Virus 4 NOT DETECTED NOT DETECTED Final   Respiratory Syncytial Virus NOT DETECTED NOT DETECTED Final   Bordetella pertussis NOT DETECTED NOT DETECTED Final   Bordetella Parapertussis NOT DETECTED NOT DETECTED Final   Chlamydophila pneumoniae NOT DETECTED NOT DETECTED Final   Mycoplasma pneumoniae NOT DETECTED NOT DETECTED Final  Comment: Performed at [facility], [address], [place], [place] [address]  Culture, blood (Routine X 2) w Reflex to ID Panel     Status: Abnormal   Collection Time: 01/07/24  9:34 PM   Specimen: BLOOD RIGHT ARM  Result Value Ref Range Status   Specimen Description BLOOD RIGHT ARM  Final   Special Requests   Final    BOTTLES DRAWN AEROBIC AND ANAEROBIC Blood Culture results may not be optimal due to an inadequate volume of blood received in culture bottles   Culture  Setup Time   Final    GRAM POSITIVE COCCI GRAM POSITIVE RODS BOTTLES DRAWN AEROBIC ONLY CRITICAL RESULT CALLED TO, READ BACK BY AND VERIFIED WITH: PHARMD DOROTHA BARLOW [id number] @ 2128 FH    Culture (A)  Final    AEROCOCCUS VIRIDANS BACILLUS SPECIES Standardized susceptibility testing for this organism is not available. Performed at [facility], [address] [place], [place] [address]    Report Status 01/11/2024 FINAL  Final  Blood Culture ID Panel (Reflexed)     Status: None   Collection Time: 01/07/24  9:34 PM  Result Value Ref Range Status   Enterococcus faecalis NOT DETECTED NOT DETECTED Final   Enterococcus Faecium NOT DETECTED NOT DETECTED Final   Listeria monocytogenes NOT DETECTED NOT DETECTED Final   Staphylococcus species NOT DETECTED NOT DETECTED Final   Staphylococcus aureus (BCID) NOT DETECTED NOT DETECTED Final   Staphylococcus epidermidis NOT DETECTED NOT  DETECTED Final   Staphylococcus lugdunensis NOT DETECTED NOT DETECTED Final   Streptococcus species NOT DETECTED NOT DETECTED Final   Streptococcus agalactiae NOT DETECTED NOT DETECTED Final   Streptococcus pneumoniae NOT DETECTED NOT DETECTED Final   Streptococcus pyogenes NOT DETECTED NOT DETECTED Final   A.calcoaceticus-baumannii NOT DETECTED NOT DETECTED Final   Bacteroides fragilis NOT DETECTED NOT DETECTED Final   Enterobacterales NOT DETECTED NOT DETECTED Final   Enterobacter cloacae complex NOT DETECTED NOT DETECTED Final   Escherichia coli NOT DETECTED NOT DETECTED Final   Klebsiella aerogenes NOT DETECTED NOT DETECTED Final   Klebsiella oxytoca NOT DETECTED NOT DETECTED Final   Klebsiella pneumoniae NOT DETECTED NOT DETECTED Final   Proteus species NOT DETECTED NOT DETECTED Final   Salmonella species NOT DETECTED NOT DETECTED Final   Serratia marcescens NOT DETECTED NOT DETECTED Final   Haemophilus influenzae NOT DETECTED NOT DETECTED Final   Neisseria meningitidis NOT DETECTED NOT DETECTED Final   Pseudomonas aeruginosa NOT DETECTED NOT DETECTED Final   Stenotrophomonas maltophilia NOT DETECTED NOT DETECTED Final   Candida albicans NOT DETECTED NOT DETECTED Final   Candida auris NOT DETECTED NOT DETECTED Final   Candida glabrata NOT DETECTED NOT DETECTED Final   Candida krusei NOT DETECTED NOT DETECTED Final   Candida parapsilosis NOT DETECTED NOT DETECTED Final   Candida tropicalis NOT DETECTED NOT DETECTED Final   Cryptococcus neoformans/gattii NOT DETECTED NOT DETECTED Final    Comment: Performed at [facility], [address], [place], [place] [address]  Urine Culture     Status: Abnormal   Collection Time: 01/08/24  4:19 AM   Specimen: Urine, Random  Result Value Ref Range Status   Specimen Description URINE, RANDOM  Final   Special Requests   Final    NONE Reflexed from [id number] Performed at [facility], [address], [place], [place] [address]     Culture >=100,000 COLONIES/mL ESCHERICHIA COLI (A)  Final   Report Status 01/10/2024 FINAL  Final   Organism ID, Bacteria ESCHERICHIA COLI (A)  Final      Susceptibility   Escherichia coli - MIC*    AMPICILLIN 8 SENSITIVE Sensitive     CEFAZOLIN (URINE) Value in next row Sensitive      2 SENSITIVEThis is a modified FDA-approved test that has been validated and its performance characteristics determined by the reporting laboratory.  This laboratory is certified under the Clinical Laboratory Improvement Amendments CLIA as qualified to perform high complexity clinical laboratory testing.    CEFEPIME Value in next row Sensitive      2 SENSITIVEThis is a modified FDA-approved test that has been validated and its performance characteristics determined by the reporting laboratory.  This laboratory is certified under the Clinical Laboratory Improvement Amendments CLIA as qualified to perform high complexity clinical laboratory testing.    ERTAPENEM Value in next row Sensitive      2 SENSITIVEThis is a modified FDA-approved test that has been validated and its performance characteristics determined by the reporting laboratory.  This laboratory is certified under the Clinical Laboratory Improvement Amendments CLIA as qualified to perform high complexity clinical laboratory testing.    CEFTRIAXONE Value in next row Sensitive      2 SENSITIVEThis is a modified FDA-approved test that has been validated and its performance characteristics determined by the reporting laboratory.  This laboratory is certified under the Clinical Laboratory Improvement Amendments CLIA as qualified to perform high complexity clinical laboratory testing.    CIPROFLOXACIN Value in next row Sensitive      2 SENSITIVEThis is a modified FDA-approved test that has been validated and its performance characteristics determined by the reporting laboratory.  This laboratory is certified under the Clinical Laboratory Improvement Amendments CLIA as  qualified to perform high complexity clinical laboratory testing.    GENTAMICIN Value in next row Sensitive      2 SENSITIVEThis is a modified FDA-approved test that has been validated and its performance characteristics determined by the reporting laboratory.  This laboratory is certified under the Clinical Laboratory Improvement Amendments CLIA as qualified to perform high complexity clinical laboratory testing.    NITROFURANTOIN Value in next row Sensitive      2 SENSITIVEThis is a modified FDA-approved test that has been validated and its performance characteristics determined by the reporting laboratory.  This laboratory is certified under the Clinical Laboratory Improvement Amendments CLIA as qualified to perform high complexity clinical laboratory testing.    TRIMETH/SULFA Value in next row Sensitive      2 SENSITIVEThis is a modified FDA-approved test that has been validated and its performance characteristics determined by the reporting laboratory.  This laboratory is certified under the Clinical Laboratory Improvement Amendments CLIA as qualified to perform high complexity clinical laboratory testing.    AMPICILLIN/SULBACTAM Value in next row Sensitive      2 SENSITIVEThis is a modified FDA-approved test that has been validated and its performance characteristics determined by the reporting laboratory.  This laboratory is certified under the Clinical Laboratory Improvement Amendments CLIA as qualified to perform high complexity clinical laboratory testing.    PIP/TAZO Value in next row Sensitive ug/mL     <=4 SENSITIVEThis is a modified FDA-approved test that has been validated and its performance characteristics determined by the reporting laboratory.  This laboratory is certified under the Clinical Laboratory Improvement Amendments CLIA as qualified to perform high complexity clinical laboratory testing.    MEROPENEM Value in next row Sensitive      <=4 SENSITIVEThis is  a modified  FDA-approved test that has been validated and its performance characteristics determined by the reporting laboratory.  This laboratory is certified under the Clinical Laboratory Improvement Amendments CLIA as qualified to perform high complexity clinical laboratory testing.    * >=100,000 COLONIES/mL ESCHERICHIA COLI  Resp panel by RT-PCR (RSV, Flu A&B, Covid) Anterior Nasal Swab     Status: None   Collection Time: 01/11/24 11:00 PM   Specimen: Anterior Nasal Swab  Result Value Ref Range Status   SARS Coronavirus 2 by RT PCR NEGATIVE NEGATIVE Final    Comment: (NOTE) SARS-CoV-2 target nucleic acids are NOT DETECTED.  The SARS-CoV-2 RNA is generally detectable in upper respiratory specimens during the acute phase of infection. The lowest concentration of SARS-CoV-2 viral copies this assay can detect is 138 copies/mL. A negative result does not preclude SARS-Cov-2 infection and should not be used as the sole basis for treatment or other patient management decisions. A negative result may occur with  improper specimen collection/handling, submission of specimen other than nasopharyngeal swab, presence of viral mutation(s) within the areas targeted by this assay, and inadequate number of viral copies(<138 copies/mL). A negative result must be combined with clinical observations, patient history, and epidemiological information. The expected result is Negative.  Fact Sheet for Patients:  BloggerCourse.com  Fact Sheet for Healthcare Providers:  SeriousBroker.it  This test is no t yet approved or cleared by the [place]  FDA and  has been authorized for detection and/or diagnosis of SARS-CoV-2 by FDA under an Emergency Use Authorization (EUA). This EUA will remain  in effect (meaning this test can be used) for the duration of the COVID-19 declaration under Section 564(b)(1) of the Act, 21 U.S.C.section 360bbb-3(b)(1), unless the  authorization is terminated  or revoked sooner.       Influenza A by PCR NEGATIVE NEGATIVE Final   Influenza B by PCR NEGATIVE NEGATIVE Final    Comment: (NOTE) The Xpert Xpress SARS-CoV-2/FLU/RSV plus assay is intended as an aid in the diagnosis of influenza from Nasopharyngeal swab specimens and should not be used as a sole basis for treatment. Nasal washings and aspirates are unacceptable for Xpert Xpress SARS-CoV-2/FLU/RSV testing.  Fact Sheet for Patients: BloggerCourse.com  Fact Sheet for Healthcare Providers: SeriousBroker.it  This test is not yet approved or cleared by the [place]  FDA and has been authorized for detection and/or diagnosis of SARS-CoV-2 by FDA under an Emergency Use Authorization (EUA). This EUA will remain in effect (meaning this test can be used) for the duration of the COVID-19 declaration under Section 564(b)(1) of the Act, 21 U.S.C. section 360bbb-3(b)(1), unless the authorization is terminated or revoked.     Resp Syncytial Virus by PCR NEGATIVE NEGATIVE Final    Comment: (NOTE) Fact Sheet for Patients: BloggerCourse.com  Fact Sheet for Healthcare Providers: SeriousBroker.it  This test is not yet approved or cleared by the [place]  FDA and has been authorized for detection and/or diagnosis of SARS-CoV-2 by FDA under an Emergency Use Authorization (EUA). This EUA will remain in effect (meaning this test can be used) for the duration of the COVID-19 declaration under Section 564(b)(1) of the Act, 21 U.S.C. section 360bbb-3(b)(1), unless the authorization is terminated or revoked.  Performed at [facility], [address], [place], [place] [address]   Urine Culture     Status: Abnormal   Collection Time: 01/12/24  1:24 AM   Specimen: Urine, Random  Result Value Ref Range Status   Specimen Description  Final     URINE, RANDOM Performed at [facility], [address] [place], [place] [address]    Special Requests   Final    NONE Reflexed from [id number] Performed at [facility], [address] [place], [place] [address]    Culture >=100,000 COLONIES/mL ESCHERICHIA COLI (A)  Final   Report Status 01/14/2024 FINAL  Final   Organism ID, Bacteria ESCHERICHIA COLI (A)  Final      Susceptibility   Escherichia coli - MIC*    AMPICILLIN 4 SENSITIVE Sensitive     CEFAZOLIN (URINE) Value in next row Sensitive      2 SENSITIVEThis is a modified FDA-approved test that has been validated and its performance characteristics determined by the reporting laboratory.  This laboratory is certified under the Clinical Laboratory Improvement Amendments CLIA as qualified to perform high complexity clinical laboratory testing.    CEFEPIME Value in next row Sensitive      2 SENSITIVEThis is a modified FDA-approved test that has been validated and its performance characteristics determined by the reporting laboratory.  This laboratory is certified under the Clinical Laboratory Improvement Amendments CLIA as qualified to perform high complexity clinical laboratory testing.    ERTAPENEM Value in next row Sensitive      2 SENSITIVEThis is a modified FDA-approved test that has been validated and its performance characteristics determined by the reporting laboratory.  This laboratory is certified under the Clinical Laboratory Improvement Amendments CLIA as qualified to perform high complexity clinical laboratory testing.    CEFTRIAXONE Value in next row Sensitive      2 SENSITIVEThis is a modified FDA-approved test that has been validated and its performance characteristics determined by the reporting laboratory.  This laboratory is certified under the Clinical Laboratory Improvement Amendments CLIA as qualified to perform high complexity clinical laboratory testing.    CIPROFLOXACIN Value in next  row Sensitive      2 SENSITIVEThis is a modified FDA-approved test that has been validated and its performance characteristics determined by the reporting laboratory.  This laboratory is certified under the Clinical Laboratory Improvement Amendments CLIA as qualified to perform high complexity clinical laboratory testing.    GENTAMICIN Value in next row Sensitive      2 SENSITIVEThis is a modified FDA-approved test that has been validated and its performance characteristics determined by the reporting laboratory.  This laboratory is certified under the Clinical Laboratory Improvement Amendments CLIA as qualified to perform high complexity clinical laboratory testing.    NITROFURANTOIN Value in next row Sensitive      2 SENSITIVEThis is a modified FDA-approved test that has been validated and its performance characteristics determined by the reporting laboratory.  This laboratory is certified under the Clinical Laboratory Improvement Amendments CLIA as qualified to perform high complexity clinical laboratory testing.    TRIMETH/SULFA Value in next row Sensitive      2 SENSITIVEThis is a modified FDA-approved test that has been validated and its performance characteristics determined by the reporting laboratory.  This laboratory is certified under the Clinical Laboratory Improvement Amendments CLIA as qualified to perform high complexity clinical laboratory testing.    AMPICILLIN/SULBACTAM Value in next row Sensitive      2 SENSITIVEThis is a modified FDA-approved test that has been validated and its performance characteristics determined by the reporting laboratory.  This laboratory is certified under the Clinical Laboratory Improvement Amendments CLIA as qualified to perform high complexity clinical laboratory testing.    PIP/TAZO Value in  next row Sensitive ug/mL     <=4 SENSITIVEThis is a modified FDA-approved test that has been validated and its performance characteristics determined by the reporting  laboratory.  This laboratory is certified under the Clinical Laboratory Improvement Amendments CLIA as qualified to perform high complexity clinical laboratory testing.    MEROPENEM Value in next row Sensitive      <=4 SENSITIVEThis is a modified FDA-approved test that has been validated and its performance characteristics determined by the reporting laboratory.  This laboratory is certified under the Clinical Laboratory Improvement Amendments CLIA as qualified to perform high complexity clinical laboratory testing.    * >=100,000 COLONIES/mL ESCHERICHIA COLI         Radiology Studies: No results found.       Scheduled Meds:  amLODipine   10 mg Oral Daily   furosemide   40 mg Intravenous Q12H   heparin   5,000 Units Subcutaneous Q8H   hydrALAZINE   50 mg Oral Q8H   pantoprazole   40 mg Oral Daily   polyethylene glycol  17 g Oral Daily   rosuvastatin   20 mg Oral Daily   sodium chloride  flush  3 mL Intravenous Q12H   Continuous Infusions:   LOS: 3 days    Time spent: 35 minutes    Leatrice Chapel, MD  [facility] Pager #: [phone] 7PM-7AM contact night coverage as above

## 2024-01-15 NOTE — Progress Notes (Signed)
 Physical Therapy Treatment Patient Details Name: [name] MRN: [id number] DOB: 1946/02/18 Today's Date: 01/15/2024   History of Present Illness 78 y.o. 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 very delayed processing, impaired sequencing, decreased insight to deficits, inconsistent report of PLOF and home set up.  Per TOC, Pt declines SNF rec. Pt exhibits poor self insight.   Pt in bed sleeping but easily arouse.  Untouched lunch tray at bedside.  Required Max Assist to transfer OOB to recliner.  General bed mobility comments: Max cueing required. Assist for trunk and bil LEs. Assist to scoot to EOB and to scoot laterally along EOB. Increased time.  slow moving. General transfer comment: Increased time. Assist to power up, stabilize, control descent. Initially with posterior bias in standing but pt able to correct with therapist steadying her. Pt with difficulty advancing L LE. Was able to complete a few pivot steps 1/4 turn to recliner.  Slow moving. General Gait Details: Pt was able to take a few turn steps from bed to recliner using walker with increased time and VC's on direction and turn completion.  Unsteady.  HIGH FALL RISK. Per TOC, Pt declines SNF rec.  Will update LPT.  Pt will need 24/7 care at home.  Will need PTAR transport.  Has all equipment.     If plan is discharge home, recommend the following: A lot of help with walking and/or transfers;A lot of help with bathing/dressing/bathroom;Assist for transportation;Help with stairs or ramp for entrance;Supervision due to cognitive status   Can travel by private vehicle     No  Equipment Recommendations  None recommended by PT     Recommendations for Other Services       Precautions / Restrictions Precautions Precautions: Fall Precaution/Restrictions Comments: CVA L side weakness Restrictions Weight Bearing Restrictions Per Provider Order: No     Mobility  Bed Mobility Overal bed mobility: Needs Assistance Bed Mobility: Supine to Sit     Supine to sit: Max assist     General bed mobility comments: Max cueing required. Assist for trunk and bil LEs. Assist to scoot to EOB and to scoot laterally along EOB. Increased time.  slow moving.    Transfers Overall transfer level: Needs assistance Equipment used: Rolling walker (2 wheels) Transfers: Sit to/from Stand, Bed to chair/wheelchair/BSC Sit to Stand: Mod assist, From elevated surface   Step pivot transfers: Mod assist, From elevated surface       General transfer comment: Increased time. Assist to power up, stabilize, control descent. Initially with posterior bias in standing but pt able to correct with therapist steadying her. Pt with difficulty advancing L LE. Was able to complete a few pivot steps 1/4 turn to recliner.  Slow moving.    Ambulation/Gait         Gait velocity: decreased     General Gait Details: Pt was able to take a few turn steps from bed to recliner using walker with increased time and VC's on direction and turn completion.   Stairs             Wheelchair Mobility     Tilt Bed    Modified Rankin (Stroke Patients Only)  Balance                                            Communication Communication Communication: Impaired Factors Affecting Communication: Difficulty expressing self  Cognition Arousal: Alert Behavior During Therapy: Flat affect                           PT - Cognition Comments: Agree with OT..... very delayed processing, impaired sequencing, decreased insight to deficits, inconsistent report of PLOF and home set up.  Per TOC, Pt declines SNF  rec. Following commands: Impaired Following commands impaired: Follows one step commands with increased time    Cueing Cueing Techniques: Tactile cues, Gestural cues, Verbal cues  Exercises      General Comments        Pertinent Vitals/Pain Pain Assessment Pain Assessment: Faces Faces Pain Scale: Hurts a little bit Pain Location: general Pain Descriptors / Indicators: Discomfort, Grimacing Pain Intervention(s): Monitored during session    Home Living                          Prior Function            PT Goals (current goals can now be found in the care plan section) Progress towards PT goals: Progressing toward goals    Frequency    Min 3X/week      PT Plan      Co-evaluation              AM-PAC PT 6 Clicks Mobility   Outcome Measure  Help needed turning from your back to your side while in a flat bed without using bedrails?: A Lot Help needed moving from lying on your back to sitting on the side of a flat bed without using bedrails?: A Lot Help needed moving to and from a bed to a chair (including a wheelchair)?: A Lot Help needed standing up from a chair using your arms (e.g., wheelchair or bedside chair)?: A Lot Help needed to walk in hospital room?: Total Help needed climbing 3-5 steps with a railing? : Total 6 Click Score: 10    End of Session Equipment Utilized During Treatment: Gait belt Activity Tolerance: Patient limited by fatigue Patient left: in chair;with call bell/phone within reach;with chair alarm set Nurse Communication: Mobility status PT Visit Diagnosis: Muscle weakness (generalized) (M62.81);Difficulty in walking, not elsewhere classified (R26.2);History of falling (Z91.81)     Time: 8565-8540 PT Time Calculation (min) (ACUTE ONLY): 25 min  Charges:    $Therapeutic Activity: 23-37 mins PT General Charges $$ ACUTE PT VISIT: 1 Visit                     Katheryn Leap  PTA Acute  Rehabilitation Services Office  M-F          [phone]

## 2024-01-15 NOTE — Plan of Care (Signed)
   Problem: Activity: Goal: Risk for activity intolerance will decrease Outcome: Progressing   Problem: Safety: Goal: Ability to remain free from injury will improve Outcome: Progressing   Problem: Skin Integrity: Goal: Risk for impaired skin integrity will decrease Outcome: Progressing

## 2024-01-15 NOTE — Telephone Encounter (Signed)
 Called pt to reschedule missed NP appt at [facility]; could not reach or leave vm due to call could not be completed

## 2024-01-16 DIAGNOSIS — R5381 Other malaise: Secondary | ICD-10-CM | POA: Diagnosis not present

## 2024-01-16 DIAGNOSIS — I5033 Acute on chronic diastolic (congestive) heart failure: Secondary | ICD-10-CM | POA: Diagnosis not present

## 2024-01-16 MED ORDER — MAGNESIUM CITRATE PO SOLN: 1.0000 | Freq: Once | ORAL | Status: DC

## 2024-01-16 MED ORDER — FUROSEMIDE 40 MG PO TABS: 40.0000 mg | ORAL_TABLET | Freq: Every day | ORAL | Status: DC

## 2024-01-16 MED ORDER — FUROSEMIDE 40 MG PO TABS
40.0000 mg | ORAL_TABLET | Freq: Every day | ORAL | Status: DC
  Administered 2024-01-16 – 2024-01-20 (×5): 40 mg via ORAL
  Filled 2024-01-16 (×5): qty 1

## 2024-01-16 MED ORDER — BISACODYL 10 MG RE SUPP
10.0000 mg | Freq: Every day | RECTAL | Status: DC | PRN
  Administered 2024-01-16: 10 mg via RECTAL
  Filled 2024-01-16: qty 1

## 2024-01-16 MED ORDER — IPRATROPIUM-ALBUTEROL 0.5-2.5 (3) MG/3ML IN SOLN
3.0000 mL | Freq: Once | RESPIRATORY_TRACT | Status: AC | PRN
  Administered 2024-01-16: 3 mL via RESPIRATORY_TRACT
  Filled 2024-01-16: qty 3

## 2024-01-16 MED ORDER — DIPHENHYDRAMINE HCL 25 MG PO CAPS
25.0000 mg | ORAL_CAPSULE | Freq: Once | ORAL | Status: AC | PRN
  Administered 2024-01-16: 25 mg via ORAL
  Filled 2024-01-16: qty 1

## 2024-01-16 MED ORDER — IBUPROFEN 200 MG PO TABS
200.0000 mg | ORAL_TABLET | Freq: Once | ORAL | Status: AC
  Administered 2024-01-16: 200 mg via ORAL
  Filled 2024-01-16: qty 1

## 2024-01-16 NOTE — Plan of Care (Signed)
  Problem: Clinical Measurements: Goal: Respiratory complications will improve Outcome: Progressing Goal: Cardiovascular complication will be avoided Outcome: Progressing   Problem: Pain Managment: Goal: General experience of comfort will improve and/or be controlled Outcome: Progressing   Problem: Skin Integrity: Goal: Risk for impaired skin integrity will decrease Outcome: Progressing

## 2024-01-16 NOTE — Progress Notes (Signed)
 Progress Note    [name]   FMW:[id number]  DOB: 1946/01/12  DOA: 01/11/2024     4 PCP: Patient, No Pcp Per  Initial CC: SOB  Hospital Course: Patient is a 78 year old female with past medical history significant for hypertension, hyperlipidemia, COPD, DVT in pregnancy, frequent falls, history of ischemic CVA in 2022 status post thrombectomy and subsequent SAH and IVH who was discharged from the hospital on 01/09/2024 and now returns from home with shortness of breath.   Patient did not take any of her medications since she was discharged and developed recurrent shortness of breath.  proBNP was elevated (3,648).   She responded well to diuresis with IV Lasix . She was evaluated by PT and recommended for rehab at discharge.    Assessment & Plan:  Acute on chronic HFpEF  - Echo repeated during hospitalization, EF 60 to 65%, no RWMA, indeterminate diastolic parameters - Diuresed well with IV Lasix .  Okay to transition to oral Lasix   Elevated D-dimer  Chronic RLE DVT -Elevated D-dimer on admission; possibly in setting of underlying chronic lower extremity DVT - Repeat CT angio chest showed no PE -Lower extremity duplex shows chronic right lower extremity DVT; not an AC candidate due to hx SAH and IVH   Elevated troponin   - suspected demand ischemia in setting of CHF exac   COPD  - Not in exacerbation  - Continue short-acting bronchodilators as-needed    Hypertension  - Continue Norvasc  and hydralazine    Hx of CVA  - Crestor      Physical debility; frequent falls  - PT recommending rehab at discharge - May also need to consider long-term care   Hypokalemia  - Replacing    Anemia; thrombocytopenia  - Bone marrow biopsy from 12/13/23 did not reveal etiology and she is planning to follow-up with hematology/oncology, Dr. Lanny  - Counts appear stable with no overt bleeding    Constipation - laxatives as needed  Interval History:  No events overnight.  Sitting in recliner  this morning comfortable.  Plan remains for rehab at discharge.   Old records reviewed in assessment of this patient  Antimicrobials:   DVT prophylaxis:  heparin  injection 5,000 Units Start: 01/12/24 0600   Code Status:   Code Status: Full Code  Mobility Assessment (Last 72 Hours)     Mobility Assessment     Row Name 01/16/24 1416 01/16/24 1035 01/15/24 2227 01/15/24 1508 01/15/24 0924   Does the patient have exclusion criteria? -- No - Perform mobility assessment No - Perform mobility assessment -- No - Perform mobility assessment   What is the highest level of mobility based on the mobility assessment? Level 4 (Ambulates with assistance) - Balance while stepping forward/back - Complete Level 3 (Stands with assistance) - Balance while standing  and cannot march in place Level 2 (Chairfast) - Balance while sitting on edge of bed and cannot stand Level 2 (Chairfast) - Balance while sitting on edge of bed and cannot stand Level 2 (Chairfast) - Balance while sitting on edge of bed and cannot stand   Is the above level different from baseline mobility prior to current illness? -- Yes - Recommend PT order -- -- Yes - Recommend PT order    Row Name 01/14/24 2002 01/14/24 1521 01/14/24 0920 01/13/24 2011 01/13/24 1610   Does the patient have exclusion criteria? No - Perform mobility assessment -- No - Perform mobility assessment No - Perform mobility assessment --   What is the highest level  of mobility based on the mobility assessment? Level 2 (Chairfast) - Balance while sitting on edge of bed and cannot stand Level 3 (Stands with assistance) - Balance while standing  and cannot march in place Level 1 (Bedfast) - Unable to balance while sitting on edge of bed Level 2 (Chairfast) - Balance while sitting on edge of bed and cannot stand Level 2 (Chairfast) - Balance while sitting on edge of bed and cannot stand   Is the above level different from baseline mobility prior to current illness? -- -- Yes  - Recommend PT order Yes - Recommend PT order --      Barriers to discharge: None Disposition Plan:  SNF HH orders placed: n/a Status is: Inpt  Objective: Blood pressure 129/68, pulse 78, temperature 98.4 F (36.9 C), temperature source Oral, resp. rate 20, height 5' 10 (1.778 m), weight 84.7 kg, SpO2 100%.  Examination:  Physical Exam Constitutional:      Appearance: Normal appearance.     Comments: Slowed mentation  HENT:     Head: Normocephalic and atraumatic.     Mouth/Throat:     Mouth: Mucous membranes are moist.  Eyes:     Extraocular Movements: Extraocular movements intact.  Cardiovascular:     Rate and Rhythm: Normal rate and regular rhythm.  Pulmonary:     Effort: Pulmonary effort is normal. No respiratory distress.     Breath sounds: Normal breath sounds. No wheezing.  Abdominal:     General: Bowel sounds are normal. There is no distension.     Palpations: Abdomen is soft.     Tenderness: There is no abdominal tenderness.  Musculoskeletal:        General: Normal range of motion.     Cervical back: Normal range of motion and neck supple.  Skin:    General: Skin is warm and dry.  Neurological:     General: No focal deficit present.     Mental Status: She is alert.  Psychiatric:        Mood and Affect: Mood normal.      Consultants:    Procedures:    Data Reviewed: No results found for this or any previous visit (from the past 24 hours).  I have reviewed pertinent nursing notes, vitals, labs, and images as necessary. I have ordered labwork to follow up on as indicated.  I have reviewed the last notes from staff over past 24 hours. I have discussed patient's care plan and test results with nursing staff, CM/SW, and other staff as appropriate.  Time spent: Greater than 50% of the 55 minute visit was spent in counseling/coordination of care for the patient as laid out in the A&P.   LOS: 4 days   Alm Apo, MD [facility] 01/16/2024, 3:14  PM

## 2024-01-16 NOTE — Hospital Course (Addendum)
 Patient is a 78 year old female with past medical history significant for hypertension, hyperlipidemia, COPD, DVT in pregnancy, frequent falls, history of ischemic CVA in 2022 status post thrombectomy and subsequent SAH and IVH who was discharged from the hospital on 01/09/2024 and now returns from home with shortness of breath.   Patient did not take any of her medications since she was discharged and developed recurrent shortness of breath.  proBNP was elevated (3,648).   She responded well to diuresis with IV Lasix . She was evaluated by PT and recommended for rehab at discharge.    Assessment & Plan:  Acute on chronic HFpEF  - Echo repeated during hospitalization, EF 60 to 65%, no RWMA, indeterminate diastolic parameters - Diuresed well with IV Lasix .  Okay to transition to oral Lasix   Elevated D-dimer  Chronic RLE DVT -Elevated D-dimer on admission; possibly in setting of underlying chronic lower extremity DVT - Repeat CT angio chest showed no PE -Lower extremity duplex shows chronic right lower extremity DVT; not an AC candidate due to hx SAH and IVH   Elevated troponin   - suspected demand ischemia in setting of CHF exac   COPD  - Not in exacerbation  - Continue short-acting bronchodilators as-needed    Hypertension  - Continue Norvasc  and hydralazine    Hx of CVA  - Crestor      Physical debility; frequent falls  - PT recommending rehab at discharge - May also need to consider long-term care   Hypokalemia  - Replacing    Anemia; thrombocytopenia  - Bone marrow biopsy from 12/13/23 did not reveal etiology and she is planning to follow-up with hematology/oncology, Dr. Lanny  - Counts appear stable with no overt bleeding    Constipation - laxatives as needed

## 2024-01-16 NOTE — Progress Notes (Signed)
 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 She required assist for supine to sit, sit to stand using a RW, to perform a stand-pivot transfer to the bedside commode, and for toileting at bedside commode level. She was noted to have L sided weakness, more pronounced in her LLE, which is a result of a prior CVA. She was noted to occasionally present with delayed initiation of tasks. At current, she is at risk for falls and restricted participation in meaningful activities. Continue OT plan of care. Patient will benefit from continued inpatient follow up therapy, <3 hours/day.       If plan is discharge home, recommend the following:  A lot of help with walking and/or transfers;A lot of help with bathing/dressing/bathroom;Assist for transportation;Assistance with cooking/housework;Direct supervision/assist for medications management   Equipment Recommendations  None recommended by OT    Recommendations for Other Services      Precautions / Restrictions Precautions Precautions: Fall Precaution/Restrictions Comments:  (Prior CVA with L sided weakness) Restrictions Weight Bearing Restrictions Per Provider Order: No       Mobility Bed Mobility Overal bed mobility: Needs Assistance Bed Mobility: Supine to Sit     Supine to sit: Mod assist, HOB elevated, Used rails          Transfers Overall transfer level: Needs assistance Equipment used: Rolling walker (2  wheels) Transfers: Sit to/from Stand Sit to Stand: Mod assist, From elevated surface           General transfer comment: Increased time. Assist to power up, stabilize, control descent.  Cues for walker placement.    Balance     Sitting balance-Leahy Scale: Fair       Standing balance-Leahy Scale: Poor        ADL either performed or assessed with clinical judgement   ADL Overall ADL's : Needs assistance/impaired          Toilet Transfer: Moderate assistance;Stand-pivot;Rolling walker (2 wheels);Cueing for sequencing Toilet Transfer Details (indicate cue type and reason): Pt required cues to lift and advance LLE (weakness noted), for walker placement, and to reach back prior to sitting. Toileting- Clothing Manipulation and Hygiene: Maximal assistance;Cueing for sequencing;Sit to/from stand Toileting - Clothing Manipulation Details (indicate cue type and reason): Toileting management performed at bedside commode level. She required assist for balance in standing,  assist for clothing management, and total assist for posterior peri-hygiene after having a bowel movement.             Communication Communication Factors Affecting Communication: Difficulty expressing self   Cognition Arousal: Alert Behavior During Therapy: Flat affect Cognition: No family/caregiver present to determine baseline             OT - Cognition Comments: Intermittently delayed initiation of tasks. Required assist for sequencing                 Following commands: Impaired Following commands impaired: Follows one step commands with increased time (and occasional repetition of prompts)      Cueing   Cueing  Techniques: Tactile cues, Gestural cues, Verbal cues             Pertinent Vitals/ Pain       Pain Assessment Pain Assessment: Faces Pain Score: 7  Pain Location: L flank/under breast Pain Descriptors / Indicators:  (Acute) Pain Intervention(s): Limited activity within  patient's tolerance, Monitored during session, Repositioned, Other (comment) (Nurse informed)   Frequency  Min 2X/week        Progress Toward Goals  OT Goals(current goals can now be found in the care plan section)     Acute Rehab OT Goals OT Goal Formulation: With patient Time For Goal Achievement: 01/28/24 Potential to Achieve Goals: Good  Plan      AM-PAC OT 6 Clicks Daily Activity     Outcome Measure   Help from another person eating meals?: A Little Help from another person taking care of personal grooming?: A Little Help from another person toileting, which includes using toliet, bedpan, or urinal?: A Lot Help from another person bathing (including washing, rinsing, drying)?: A Lot Help from another person to put on and taking off regular upper body clothing?: A Little Help from another person to put on and taking off regular lower body clothing?: A Lot 6 Click Score: 15    End of Session Equipment Utilized During Treatment: Gait belt;Rolling walker (2 wheels)  OT Visit Diagnosis: Unsteadiness on feet (R26.81);Other abnormalities of gait and mobility (R26.89);Muscle weakness (generalized) (M62.81);History of falling (Z91.81);Hemiplegia and hemiparesis Hemiplegia - Right/Left: Left Hemiplegia - dominant/non-dominant: Non-Dominant   Activity Tolerance Patient tolerated treatment well   Patient Left in chair;with call bell/phone within reach;with chair alarm set   Nurse Communication Other (comment) (Pt reporteding increased pain in her L flank/under breast area)        Time: 8769-8746 OT Time Calculation (min): 23 min  Charges: OT General Charges $OT Visit: 1 Visit OT Treatments $Self Care/Home Management : 8-22 mins $Therapeutic Activity: 8-22 mins     Delanna JINNY Lesches, OTR/L 01/16/2024, 2:19 PM

## 2024-01-16 NOTE — Progress Notes (Signed)
 Mobility Specialist - Progress Note   01/16/24 0929  Mobility  Activity Pivoted/transferred from bed to chair  Level of Assistance +2 (takes two people)  Press photographer wheel walker  Range of Motion/Exercises Active  Activity Response Tolerated well  Mobility Referral Yes  Mobility visit 1 Mobility  Mobility Specialist Start Time (ACUTE ONLY) 0913  Mobility Specialist Stop Time (ACUTE ONLY) [id number]  Mobility Specialist Time Calculation (min) (ACUTE ONLY) 14 min   Pt was found in bed and agreeable to mobilize. NT in room and assisted with transfer to recliner chair. Pt required verbal cues. At EOS was left on recliner chair with all needs met. Call bell in reach and chair alarm on.   Erminio Leos,  Mobility Specialist Can be reached via Secure Chat

## 2024-01-17 DIAGNOSIS — R5381 Other malaise: Secondary | ICD-10-CM | POA: Diagnosis not present

## 2024-01-17 DIAGNOSIS — I5033 Acute on chronic diastolic (congestive) heart failure: Secondary | ICD-10-CM | POA: Diagnosis not present

## 2024-01-17 MED ORDER — FUROSEMIDE 10 MG/ML IJ SOLN
20.0000 mg | Freq: Once | INTRAMUSCULAR | Status: AC
  Administered 2024-01-17: 20 mg via INTRAVENOUS
  Filled 2024-01-17: qty 2

## 2024-01-17 MED ORDER — IPRATROPIUM-ALBUTEROL 0.5-2.5 (3) MG/3ML IN SOLN
3.0000 mL | Freq: Four times a day (QID) | RESPIRATORY_TRACT | Status: DC | PRN
  Administered 2024-01-18: 3 mL via RESPIRATORY_TRACT
  Filled 2024-01-17: qty 3

## 2024-01-17 MED ORDER — BENZONATATE 100 MG PO CAPS: 100.0000 mg | ORAL_CAPSULE | Freq: Three times a day (TID) | ORAL | Status: DC | PRN

## 2024-01-17 NOTE — Plan of Care (Signed)

## 2024-01-17 NOTE — Progress Notes (Signed)
 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   Patient did not take any of her medications since she was discharged and developed recurrent shortness of breath.  proBNP was elevated (3,648).   She responded well to diuresis with IV Lasix . She was evaluated by PT and recommended for rehab at discharge. She declined and has asked to go home with [facility].     Assessment & Plan:  Acute on chronic HFpEF  - Echo repeated during hospitalization, EF 60 to 65%, no RWMA, indeterminate diastolic parameters - Diuresed well with IV Lasix .  Okay to transition to oral Lasix   Elevated D-dimer  Chronic RLE DVT -Elevated D-dimer on admission; possibly in setting of underlying chronic lower extremity DVT - Repeat CT angio chest showed no PE -Lower extremity duplex shows chronic right lower extremity DVT; not an AC candidate due to hx SAH and IVH   Elevated troponin   - suspected demand ischemia in setting of CHF exac   COPD  - Not in exacerbation  - Continue short-acting bronchodilators as-needed    Hypertension  - Continue Norvasc  and hydralazine    Hx of CVA  - Crestor      Physical debility; frequent falls  - PT recommending rehab at discharge - May also need to consider long-term care   Hypokalemia  - Replacing    Anemia; thrombocytopenia  - Bone marrow biopsy from 12/13/23 did not reveal etiology and she is planning to follow-up with hematology/oncology, Dr. Lanny  - Counts appear stable with no overt bleeding    Constipation - laxatives as needed  Interval  History:  No events overnight.  Resting in bed. I question her ability to care for herself at home however appears she has help. Unable to get in touch with son or daughter today despite multiple tries.  Voicemail left.  Old records reviewed in assessment of this patient  Antimicrobials:   DVT prophylaxis:  heparin  injection 5,000 Units Start: 01/12/24 0600   Code Status:   Code Status: Full Code  Mobility Assessment (Last 72 Hours)     Mobility Assessment     Row Name 01/17/24 0855 01/17/24 0024 01/16/24 1416 01/16/24 1035 01/15/24 2227   Does the patient have exclusion criteria? No - Perform mobility assessment No - Perform mobility assessment -- No - Perform mobility assessment No - Perform mobility assessment   What is the highest level of mobility based on the mobility assessment? Level 4 (Ambulates with assistance) - Balance while stepping forward/back - Complete Level 4 (Ambulates with assistance) - Balance while stepping forward/back - Complete Level 4 (Ambulates with assistance) - Balance while stepping forward/back - Complete Level 3 (Stands with assistance) - Balance while standing  and cannot march in place Level 2 (Chairfast) - Balance while sitting on edge of bed and cannot stand   Is the above level different from baseline mobility prior to current illness? Yes - Recommend PT order -- -- Yes - Recommend PT order --    Row Name 01/15/24 1508 01/15/24 0924 01/14/24 2002 01/14/24 1521  Does the patient have exclusion criteria? -- No - Perform mobility assessment No - Perform mobility assessment --    What is the highest level of mobility based on the mobility assessment? Level 2 (Chairfast) - Balance while sitting on edge of bed and cannot stand Level 2 (Chairfast) - Balance while sitting on edge of bed and cannot stand Level 2 (Chairfast) - Balance while sitting on edge of bed and cannot stand Level 3 (Stands with assistance) - Balance while standing  and cannot march in  place    Is the above level different from baseline mobility prior to current illness? -- Yes - Recommend PT order -- --       Barriers to discharge: None Disposition Plan:  SNF HH orders placed: n/a Status is: Inpt  Objective: Blood pressure 128/74, pulse 75, temperature 98.2 F (36.8 C), temperature source Oral, resp. rate 20, height 5' 10 (1.778 m), weight 85.8 kg, SpO2 100%.  Examination:  Physical Exam Constitutional:      Appearance: Normal appearance.     Comments: Slowed mentation  HENT:     Head: Normocephalic and atraumatic.     Mouth/Throat:     Mouth: Mucous membranes are moist.  Eyes:     Extraocular Movements: Extraocular movements intact.  Cardiovascular:     Rate and Rhythm: Normal rate and regular rhythm.  Pulmonary:     Effort: Pulmonary effort is normal. No respiratory distress.     Breath sounds: Normal breath sounds. No wheezing.  Abdominal:     General: Bowel sounds are normal. There is no distension.     Palpations: Abdomen is soft.     Tenderness: There is no abdominal tenderness.  Musculoskeletal:        General: Normal range of motion.     Cervical back: Normal range of motion and neck supple.  Skin:    General: Skin is warm and dry.  Neurological:     General: No focal deficit present.     Mental Status: She is alert.  Psychiatric:        Mood and Affect: Mood normal.      Consultants:    Procedures:    Data Reviewed: No results found for this or any previous visit (from the past 24 hours).  I have reviewed pertinent nursing notes, vitals, labs, and images as necessary. I have ordered labwork to follow up on as indicated.  I have reviewed the last notes from staff over past 24 hours. I have discussed patient's care plan and test results with nursing staff, CM/SW, and other staff as appropriate.    LOS: 5 days   Alm Apo, MD [facility] 01/17/2024, 2:37 PM

## 2024-01-17 NOTE — Progress Notes (Signed)
 Heart Failure Navigator Progress Note  Assessed for Heart & Vascular TOC clinic readiness.  Patient does not meet criteria due to EF 60-65%, per MD notes patient with poor self insight, slow mentation. Preferred PT recommendation at discharge is SNF. No HF TOC . SABRA   Navigator will sign off at this time.   Stephane Haddock, BSN, Scientist, clinical (histocompatibility and immunogenetics) Only

## 2024-01-18 ENCOUNTER — Other Ambulatory Visit (HOSPITAL_COMMUNITY): Payer: Self-pay

## 2024-01-18 DIAGNOSIS — R5381 Other malaise: Secondary | ICD-10-CM | POA: Diagnosis not present

## 2024-01-18 DIAGNOSIS — I5033 Acute on chronic diastolic (congestive) heart failure: Secondary | ICD-10-CM | POA: Diagnosis not present

## 2024-01-18 MED ORDER — MELATONIN 5 MG PO TABS
5.0000 mg | ORAL_TABLET | Freq: Every evening | ORAL | Status: DC | PRN
  Filled 2024-01-18: qty 1

## 2024-01-18 MED ORDER — FUROSEMIDE 40 MG PO TABS
40.0000 mg | ORAL_TABLET | Freq: Every day | ORAL | 3 refills | Status: AC
  Filled 2024-01-18: qty 30, 30d supply, fill #0

## 2024-01-18 NOTE — Discharge Instructions (Addendum)
 Please follow up with primary care provider within in 2 weeks.

## 2024-01-18 NOTE — Progress Notes (Signed)
 Physical Therapy Treatment Patient Details Name: [name] MRN: [id number] DOB: 12/12/1945 Today's Date: 01/18/2024   History of Present Illness 77 y.o. female admitted with HF after falling at home  PMH: CVA in '22 requiring thrombectomy and subsequent SAH / IVH, HTN, HLD, COPD, DVT during pregnancy not on AC; Chronic R femoral DVT persists, aortic aneurysm, lung nodule, recent hx of recurrent ground level falls, and severe symptomatic anemia, thrombocytopenia    PT Comments  Pt reports she can ambulate when encouraged to mobilize with therapy today.  Pt prefers to d/c home.  Pt does present with cognitive deficits (possibly baseline from previous CVA however staff have not been able to contact family) and requires multimodal cues for technique.  Pt also requiring at least mod assist (+2 for safety) to stand and transfer OOB to recliner.  Pt was not able to ambulate today and started crying upon sitting in recliner.  She reported being upset she was not moving better.  Pt also reports not wearing oxygen at home so removed for session and SPO2 100% at rest on room air and then 100% after transfer to recliner on room air so O2 [place] left off (RN notified).  Pt has declined SNF recommendation per notes however she is unable to ambulate and requiring increased physical assist.  Pt does not appear safe to return home without cognitive and physical assist (and uncertain of pt's true baseline as staff have not been able to get into contact with family).      If plan is discharge home, recommend the following: A lot of help with walking and/or transfers;A lot of help with bathing/dressing/bathroom;Assist for transportation;Help with stairs or ramp for entrance;Supervision due to cognitive status   Can travel by private vehicle     No  Equipment Recommendations  None recommended by PT    Recommendations for Other Services       Precautions / Restrictions Precautions Precautions: Fall Recall of  Precautions/Restrictions: Impaired Precaution/Restrictions Comments: residual L sided weakness from previous CVA     Mobility  Bed Mobility Overal bed mobility: Needs Assistance Bed Mobility: Supine to Sit     Supine to sit: Mod assist, HOB elevated     General bed mobility comments: multimodal cues for technique, requires increased time to process and perform, assist for left leg and scooting to EOB, bring trunk upright    Transfers Overall transfer level: Needs assistance Equipment used: Rolling walker (2 wheels) Transfers: Sit to/from Stand, Bed to chair/wheelchair/BSC Sit to Stand: Mod assist, From elevated surface   Step pivot transfers: Mod assist       General transfer comment: assist to rise, stabilize and control descent; cues for weight bearing on UEs through RW, difficulty moving L LE but improved with cues; pt reports feeling unsteady    Ambulation/Gait                   Stairs             Wheelchair Mobility     Tilt Bed    Modified Rankin (Stroke Patients Only)       Balance Overall balance assessment: Needs assistance, History of Falls       Postural control: Posterior lean Standing balance support: Bilateral upper extremity supported, During functional activity, Reliant on assistive device for balance Standing balance-Leahy Scale: Poor  Communication Communication Communication: Impaired Factors Affecting Communication: Difficulty expressing self  Cognition Arousal: Alert Behavior During Therapy: Flat affect   PT - Cognitive impairments: No family/caregiver present to determine baseline, Sequencing, Initiation, Problem solving                       PT - Cognition Comments: presents with delayed processing, difficulty initiating and sequecing, decreased insight into deficits; unknown baseline as staff has not been able to contact family Following commands: Impaired Following  commands impaired: Follows one step commands with increased time    Cueing Cueing Techniques: Tactile cues, Gestural cues, Verbal cues  Exercises      General Comments        Pertinent Vitals/Pain Pain Assessment Pain Assessment: No/denies pain    Home Living                          Prior Function            PT Goals (current goals can now be found in the care plan section) Progress towards PT goals: Progressing toward goals    Frequency    Min 3X/week      PT Plan      Co-evaluation              AM-PAC PT 6 Clicks Mobility   Outcome Measure  Help needed turning from your back to your side while in a flat bed without using bedrails?: A Lot Help needed moving from lying on your back to sitting on the side of a flat bed without using bedrails?: A Lot Help needed moving to and from a bed to a chair (including a wheelchair)?: A Lot Help needed standing up from a chair using your arms (e.g., wheelchair or bedside chair)?: A Lot Help needed to walk in hospital room?: Total Help needed climbing 3-5 steps with a railing? : Total 6 Click Score: 10    End of Session Equipment Utilized During Treatment: Gait belt Activity Tolerance: Patient limited by fatigue Patient left: in chair;with call bell/phone within reach;with chair alarm set Nurse Communication: Mobility status PT Visit Diagnosis: Muscle weakness (generalized) (M62.81);Difficulty in walking, not elsewhere classified (R26.2)     Time: 8573-8554 PT Time Calculation (min) (ACUTE ONLY): 19 min  Charges:    $Therapeutic Activity: 8-22 mins PT General Charges $$ ACUTE PT VISIT: 1 Visit                     Tari KLEIN, DPT Physical Therapist Acute Rehabilitation Services Office: [phone]  Tari CROME [place] 01/18/2024, 5:09 PM

## 2024-01-18 NOTE — Care Management Important Message (Signed)
 Important Message  Patient Details IM Letter given. Name: [name] MRN: [id number] Date of Birth: 15-Dec-1945   Important Message Given:  Yes - Medicare IM     Emerick Weatherly 01/18/2024, 12:26 PM

## 2024-01-18 NOTE — TOC Progression Note (Signed)
 Transition of Care [facility]) - Progression Note   Patient Details  Name: [name] MRN: [id number] Date of Birth: 1945-12-24  Transition of Care [facility]) CM/SW Contact  Duwaine GORMAN Aran, LCSW Phone Number: 01/18/2024, 3:54 PM  Clinical Narrative: CSW notified by RN and hospitalist regarding patient reporting not having a caregiver in the home when the daughter is gone and treatment team has been unable to reach the daughter for a few days, so staff is unable to confirm patient will be able to discharge home. It was requested that an APS report be made. CSW made report to [facility] with APS. Care management awaiting confirmation from APS whether report will be screened in or not.  Expected Discharge Plan: Home w Home Health Services Barriers to Discharge: Continued Medical Work up  Expected Discharge Plan and Services In-house Referral: Clinical Social Work Post Acute Care Choice: Home Health Living arrangements for the past 2 months: Skilled Nursing Facility Expected Discharge Date: 01/18/24               DME Arranged: N/A DME Agency: NA HH Arranged: Patient Refused HH, PT, OT HH Agency: [facility] Date HH Agency Contacted: 01/15/24 Time HH Agency Contacted: 1415 Representative spoke with at [facility] Agency: [place]  Social Drivers of Health (SDOH) Interventions SDOH Screenings   Food Insecurity: No Food Insecurity (01/12/2024)  Housing: Low Risk  (01/12/2024)  Transportation Needs: Unmet Transportation Needs (01/12/2024)  Utilities: Not At Risk (01/12/2024)  Depression (PHQ2-9): Low Risk  (06/04/2021)  Social Connections: Socially Isolated (01/12/2024)  Tobacco Use: Medium Risk (01/12/2024)   Readmission Risk Interventions    01/15/2024    2:24 PM  Readmission Risk Prevention Plan  Transportation Screening Complete  HRI or Home Care Consult Complete  Social Work Consult for Recovery Care Planning/Counseling Complete  Palliative Care Screening Not Applicable  Medication Review Furniture conservator/restorer) Complete

## 2024-01-18 NOTE — Progress Notes (Signed)
 Occupational Therapy Treatment Patient Details Name: [name] MRN: [id number] DOB: [birth year] Today's Date: 01/18/2024   History of present illness 78 yr old female admitted with heart failure after falling at home  PMH: CVA in 2022 requiring thrombectomy and subsequent SAH / IVH, HTN, HLD, COPD, DVT during pregnancy,chronic R femoral DVT persists, aortic aneurysm, lung nodule, recent falls, severe symptomatic anemia, thrombocytopenia   OT comments  The pt was received in bed. She was agreeable to therapy participation. She required mod assist with slightly increased time and effort to perform supine to sit. She subsequently required mod assist to stand from the EOB using a RW, min assist with max cues for transfer technique to step-pivot to the bedside commode, and max assist for toileting management at bedside commode level. She denied having pain, though referenced her L sided weakness from an old CVA. Once back in bed, she required assist needed to remove lids, open packets and cut food, in order to initiate self-feeding, as she is limited by decreased L hand strength with impaired fine motor coordination from a prior CVA. Continue OT plan of care. Patient will benefit from continued inpatient follow up therapy, <3 hours/day.       If plan is discharge home, recommend the following:  A lot of help with walking and/or transfers;A lot of help with bathing/dressing/bathroom;Assist for transportation;Assistance with cooking/housework;Direct supervision/assist for medications management   Equipment Recommendations  None recommended by OT    Recommendations for Other Services      Precautions / Restrictions Precautions Precautions: Fall Restrictions Weight Bearing Restrictions Per Provider Order: No Other Position/Activity Restrictions: L sided weakness from prior CVA       Mobility Bed Mobility Overal bed mobility: Needs Assistance Bed Mobility: Supine to Sit     Supine to sit:  Mod assist, HOB elevated, Used rails Sit to supine: HOB elevated, Used rails, Min assist        Transfers Overall transfer level: Needs assistance Equipment used: Rolling walker (2 wheels) Transfers: Sit to/from Stand, Bed to chair/wheelchair/BSC Sit to Stand: Mod assist, From elevated surface     Step pivot transfers: Mod assist, From elevated surface     General transfer comment:    Balance     Sitting balance-Leahy Scale: Fair       Standing balance-Leahy Scale: Poor             ADL either performed or assessed with clinical judgement   ADL Overall ADL's : Needs assistance/impaired Eating/Feeding: Set up;Bed level Eating/Feeding Details (indicate cue type and reason): She required assist needed to remove lids, open packets and cut food, as she is limited by decreased L hand strength with impaired fine motor coordination from a prior CVA. She initiated self-feeding in bed.        Lower Body Dressing: Sitting/lateral leans;Maximal assistance Lower Body Dressing Details (indicate cue type and reason): for sock management seated EOB Toilet Transfer: Moderate assistance;Stand-pivot;Rolling walker (2 wheels);Cueing for sequencing Toilet Transfer Details (indicate cue type and reason): Pt required cues to lift and advance LLE (weakness noted), for walker placement, and to reach back prior to sitting. Toileting- Clothing Manipulation and Hygiene: Maximal assistance;Cueing for sequencing;Sit to/from stand Toileting - Clothing Manipulation Details (indicate cue type and reason): Toileting management performed at bedside commode level. She required assist for balance in standing,  assist for clothing management, and total assist for posterior peri-hygiene after having a bowel movement.  Communication Communication Factors Affecting Communication: Difficulty expressing self   Cognition Arousal: Alert Behavior During Therapy: Flat affect Cognition: No  family/caregiver present to determine baseline        OT - Cognition Comments: Intermittently delayed initiation of tasks. Required assist for sequencing          Following commands: Impaired Following commands impaired: Follows one step commands with increased time      Cueing   Cueing Techniques: Tactile cues, Gestural cues, Verbal cues             Pertinent Vitals/ Pain       Pain Assessment Pain Assessment: No/denies pain   Frequency  Min 2X/week        Progress Toward Goals  OT Goals(current goals can now be found in the care plan section)     Acute Rehab OT Goals Patient Stated Goal: to return home soon OT Goal Formulation: With patient Time For Goal Achievement: 01/28/24 Potential to Achieve Goals: Good  Plan         AM-PAC OT 6 Clicks Daily Activity     Outcome Measure   Help from another person eating meals?: A Little Help from another person taking care of personal grooming?: A Little Help from another person toileting, which includes using toliet, bedpan, or urinal?: A Lot Help from another person bathing (including washing, rinsing, drying)?: A Lot Help from another person to put on and taking off regular upper body clothing?: A Little Help from another person to put on and taking off regular lower body clothing?: A Lot 6 Click Score: 15    End of Session Equipment Utilized During Treatment: Rolling walker (2 wheels);Gait belt  OT Visit Diagnosis: Unsteadiness on feet (R26.81);Other abnormalities of gait and mobility (R26.89);Muscle weakness (generalized) (M62.81);History of falling (Z91.81);Hemiplegia and hemiparesis Hemiplegia - Right/Left: Left Hemiplegia - dominant/non-dominant: Non-Dominant   Activity Tolerance Patient tolerated treatment well   Patient Left in bed;with call bell/phone within reach;with bed alarm set   Nurse Communication Other (comment)        Time: 8644-8579 OT Time Calculation (min): 25 min  Charges: OT  General Charges $OT Visit: 1 Visit OT Treatments $Self Care/Home Management : 8-22 mins $Therapeutic Activity: 8-22 mins     Delanna JINNY Lesches, OTR/L 01/18/2024, 3:59 PM

## 2024-01-18 NOTE — Progress Notes (Signed)
 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   Patient did not take any of her medications since she was discharged and developed recurrent shortness of breath.  proBNP was elevated (3,648).   She responded well to diuresis with IV Lasix . She was evaluated by PT and recommended for rehab at discharge. She declined and has asked to go home with [facility].     Assessment & Plan:  Acute on chronic HFpEF  - Echo repeated during hospitalization, EF 60 to 65%, no RWMA, indeterminate diastolic parameters - Diuresed well with IV Lasix .  Okay to transition to oral Lasix   Physical debility; frequent falls  Cognitive impairment  - PT recommending rehab at discharge - May also need to consider long-term care - patient declined rehab and I spoke with son who states patient can be stubborn at times too; unable to get in touch with the family she lives with but her mentation is questionable so needs 24/7 care with family; if unable to be provided then may need to rethink discharge planning and/or consider APS eval  Elevated D-dimer  Chronic RLE DVT -Elevated D-dimer on admission; possibly in setting of underlying chronic lower extremity DVT - Repeat CT angio chest showed no PE -Lower extremity duplex shows chronic right lower extremity DVT; not an AC candidate due to hx SAH and IVH   Elevated troponin   - suspected demand ischemia in setting of CHF exac   COPD  - Not in exacerbation  - Continue short-acting bronchodilators as-needed    Hypertension  - Continue Norvasc  and  hydralazine    Hx of CVA  - Crestor      Hypokalemia  - Replacing    Anemia; thrombocytopenia  - Bone marrow biopsy from 12/13/23 did not reveal etiology and she is planning to follow-up with hematology/oncology, Dr. Lanny  - Counts appear stable with no overt bleeding    Constipation - laxatives as needed  Interval History:  No events overnight.  Resting in bed. I question her ability to care for herself at home however she lives with family.  I was only able to get in touch with Daryl who lives out of state, but informed him we're trying to discharge her home.  [name] number goes straight to [employer].  Old records reviewed in assessment of this patient  Antimicrobials:   DVT prophylaxis:  heparin  injection 5,000 Units Start: 01/12/24 0600   Code Status:   Code Status: Full Code  Mobility Assessment (Last 72 Hours)     Mobility Assessment     Row Name 01/18/24 0800 01/18/24 0020 01/17/24 0855 01/17/24 0024 01/16/24 1416   Does the patient have exclusion criteria? No - Perform mobility assessment No - Perform mobility assessment No - Perform mobility assessment No - Perform mobility assessment --   What is the highest level of mobility based on the mobility assessment? Level 2 (Chairfast) - Balance while sitting on edge of bed and cannot stand Level 4 (Ambulates with assistance) - Balance while stepping forward/back - Complete Level 4 (Ambulates with assistance) - Balance while  stepping forward/back - Complete Level 4 (Ambulates with assistance) - Balance while stepping forward/back - Complete Level 4 (Ambulates with assistance) - Balance while stepping forward/back - Complete   Is the above level different from baseline mobility prior to current illness? Yes - Recommend PT order -- Yes - Recommend PT order -- --    Row Name 01/16/24 1035 01/15/24 2227         Does the patient have exclusion criteria? No - Perform mobility assessment No - Perform mobility assessment       What is the highest level of mobility based on the mobility assessment? Level 3 (Stands with assistance) - Balance while standing  and cannot march in place Level 2 (Chairfast) - Balance while sitting on edge of bed and cannot stand      Is the above level different from baseline mobility prior to current illness? Yes - Recommend PT order --         Barriers to discharge: None Disposition Plan: Home with home health HH orders placed: n/a Status is: Inpt  Objective: Blood pressure 125/60, pulse 70, temperature 97.6 F (36.4 C), temperature source Oral, resp. rate 18, height 5' 10 (1.778 m), weight 85.1 kg, SpO2 98%.  Examination:  Physical Exam Constitutional:      Appearance: Normal appearance.     Comments: Slowed mentation  HENT:     Head: Normocephalic and atraumatic.     Mouth/Throat:     Mouth: Mucous membranes are moist.  Eyes:     Extraocular Movements: Extraocular movements intact.  Cardiovascular:     Rate and Rhythm: Normal rate and regular rhythm.  Pulmonary:     Effort: Pulmonary effort is normal. No respiratory distress.     Breath sounds: Normal breath sounds. No wheezing.  Abdominal:     General: Bowel sounds are normal. There is no distension.     Palpations: Abdomen is soft.     Tenderness: There is no abdominal tenderness.  Musculoskeletal:        General: Normal range of motion.     Cervical back: Normal range of motion and neck supple.  Skin:    General: Skin is warm and dry.  Neurological:     General: No focal deficit present.     Mental Status: She is alert.  Psychiatric:        Mood and Affect: Mood normal.      Consultants:    Procedures:    Data Reviewed: No results found for this or any previous visit (from the past 24 hours).  I have reviewed pertinent nursing notes, vitals, labs, and images as necessary. I have ordered labwork to follow up on as indicated.  I have reviewed the last notes from staff over past 24 hours. I have  discussed patient's care plan and test results with nursing staff, CM/SW, and other staff as appropriate.    LOS: 6 days   Alm Apo, MD [facility] 01/18/2024, 3:35 PM

## 2024-01-19 ENCOUNTER — Other Ambulatory Visit (HOSPITAL_COMMUNITY): Payer: Self-pay

## 2024-01-19 DIAGNOSIS — R5381 Other malaise: Secondary | ICD-10-CM | POA: Diagnosis not present

## 2024-01-19 DIAGNOSIS — I5033 Acute on chronic diastolic (congestive) heart failure: Secondary | ICD-10-CM | POA: Diagnosis not present

## 2024-01-19 MED ORDER — HYDROXYZINE HCL 25 MG PO TABS
25.0000 mg | ORAL_TABLET | Freq: Three times a day (TID) | ORAL | 1 refills | Status: AC | PRN
  Filled 2024-01-19: qty 30, 10d supply, fill #0

## 2024-01-19 MED ORDER — HYDROXYZINE HCL 25 MG PO TABS
25.0000 mg | ORAL_TABLET | Freq: Three times a day (TID) | ORAL | Status: DC | PRN
  Administered 2024-01-20: 25 mg via ORAL
  Filled 2024-01-19: qty 1

## 2024-01-19 NOTE — TOC Transition Note (Signed)
 Transition of Care [facility]) - Discharge Note  Patient Details  Name: [name] MRN: [id number] Date of Birth: Apr 21, 1946  [facility] CM/SW Contact:  Duwaine GORMAN Aran, LCSW Phone Number: 01/19/2024, 8:55 AM  Clinical Narrative: CSW notified by APS on 01/18/24 at 4:40pm that report was screened in. CSW confirmed patient's PCP with patient and [facility], so PCP information has been updated. Kelly with [facility] notified of discharge. Care management signing off.  Final next level of care: Home w Home Health Services Barriers to Discharge: Barriers Resolved  Patient Goals and CMS Choice Patient states their goals for this hospitalization and ongoing recovery are:: Return home CMS Medicare.gov Compare Post Acute Care list provided to:: Patient Choice offered to / list presented to : Patient  Discharge Plan and Services Additional resources added to the After Visit Summary for   In-house Referral: Clinical Social Work Post Acute Care Choice: Home Health          DME Arranged: N/A DME Agency: NA HH Arranged: PT, OT, Refused SNF HH Agency: [facility] Date [facility] Contacted: 01/15/24 Time HH Agency Contacted: 1415 Representative spoke with at [facility] Agency: [place]  Social Drivers of Health (SDOH) Interventions SDOH Screenings   Food Insecurity: No Food Insecurity (01/12/2024)  Housing: Low Risk  (01/12/2024)  Transportation Needs: Unmet Transportation Needs (01/12/2024)  Utilities: Not At Risk (01/12/2024)  Depression (PHQ2-9): Low Risk  (06/04/2021)  Social Connections: Socially Isolated (01/12/2024)  Tobacco Use: Medium Risk (01/12/2024)   Readmission Risk Interventions    01/15/2024    2:24 PM  Readmission Risk Prevention Plan  Transportation Screening Complete  HRI or Home Care Consult Complete  Social Work Consult for Recovery Care Planning/Counseling Complete  Palliative Care Screening Not Applicable  Medication Review Oceanographer) Complete

## 2024-01-19 NOTE — Discharge Summary (Signed)
 Physician Discharge Summary   [name] FMW:[id number] DOB: June 30, 1945 DOA: 01/11/2024  PCP: Stephen Rojelio PARAS, NP  Admit date: 01/11/2024 Discharge date: 01/19/2024  Admitted From: Home Disposition:  Home Discharging physician: Alm Apo, MD Barriers to discharge: none  Recommendations at discharge: Consider capacity evaluation if readmitted and patient declines SNF and family wishes to pursue Check BMP as started on lasix  at discharge   Home Health: PT/OT  Discharge Condition: stable CODE STATUS: Full Diet recommendation:  Diet Orders (From admission, onward)     Start     Ordered   01/19/24 0000  Diet - low sodium heart healthy        01/19/24 1102   01/18/24 0000  Diet - low sodium heart healthy        01/18/24 1142   01/12/24 0221  Diet regular Room service appropriate? Yes; Fluid consistency: Thin  Diet effective now       Question Answer Comment  Room service appropriate? 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   Patient did not take any of her medications since she was discharged and developed recurrent shortness of breath.  proBNP was elevated (3,648).   She responded well to diuresis with IV Lasix . She was evaluated by PT and recommended for rehab at discharge. She declined and has asked to go home with [facility].     Assessment & Plan:  Acute on chronic HFpEF  - Echo repeated during hospitalization, EF 60 to 65%, no RWMA, indeterminate diastolic parameters - Diuresed well with IV Lasix .   - Continue oral Lasix   Physical debility; frequent falls  Cognitive impairment  - PT recommending rehab  at discharge; patient has declined this multiple times, wishing to return home - May also need to consider long-term care at some point - APS referral was placed on 9/11 due to difficulty getting in touch with family and having some concerns about discharge plans; daughter came by the hospital later that afternoon and clarified plan.  Patient still declining rehab at that time and plan was to take patient home with family on 9/12 - if patient changes mind, we can pursue SNF, however currently declining; if family is adamant on SNF while patient declining, I would then next proceed with a capacity evaluation for patient before more discussions with family   Elevated D-dimer  Chronic RLE DVT -Elevated D-dimer on admission; possibly in setting of underlying chronic lower extremity DVT - Repeat CT angio chest showed no PE -Lower extremity duplex shows chronic right lower extremity DVT; not an AC candidate due to hx SAH and IVH   Elevated troponin   - suspected demand ischemia in setting of CHF exac   COPD  - Not in exacerbation  - Continue short-acting bronchodilators as-needed    Hypertension  - Continue Norvasc  and hydralazine    Hx of CVA  - Crestor      Hypokalemia  - Replacing    Anemia; thrombocytopenia  - Bone marrow biopsy from 12/13/23 did not reveal etiology and she is planning to follow-up with hematology/oncology, Dr. Lanny  - Counts appear stable with  no overt bleeding    Constipation - laxatives as needed   The patient's acute and chronic medical conditions were treated accordingly. On day of discharge, patient was felt deemed stable for discharge. Patient/family member advised to call PCP or come back to ER if needed.   Principal Diagnosis: Acute on chronic heart failure with preserved ejection fraction (HFpEF) (HCC)  Discharge Diagnoses: Active Hospital Problems   Diagnosis Date Noted   Acute on chronic heart failure with preserved ejection fraction (HFpEF) (HCC)  01/12/2024    Priority: 1.   Physical debility 01/12/2024    Priority: 2.   Elevated troponin 01/12/2024   Positive D dimer 01/12/2024   History of DVT (deep vein thrombosis)    Hypokalemia 01/10/2024   Essential hypertension    History of stroke 09/16/2020   COPD (chronic obstructive pulmonary disease) [facility])    Dyslipidemia     Resolved Hospital Problems  No resolved problems to display.     Discharge Instructions     Diet - low sodium heart healthy   Complete by: As directed    Diet - low sodium heart healthy   Complete by: As directed    Increase activity slowly   Complete by: As directed    Increase activity slowly   Complete by: As directed    No wound care   Complete by: As directed    No wound care   Complete by: As directed       Allergies as of 01/19/2024       Reactions   Iodinated Contrast Media Itching, Other (See Comments)   9/1 developed diffuse itching after contrast dye for CT (without rash or airway involvement or GI symptoms) . Consider pretreatment before IV contrast in the future         Medication List     STOP taking these medications    cefUROXime  500 MG tablet Commonly known as: CEFTIN        TAKE these medications    albuterol  108 (90 Base) MCG/ACT inhaler Commonly known as: VENTOLIN  HFA Inhale 2 puffs into the lungs every 6 (six) hours as needed for wheezing or shortness of breath.   amLODipine  10 MG tablet Commonly known as: NORVASC  Take 1 tablet (10 mg total) by mouth daily.   furosemide  40 MG tablet Commonly known as: LASIX  Take 1 tablet (40 mg total) by mouth daily.   hydrALAZINE  50 MG tablet Commonly known as: APRESOLINE  Take 1 tablet (50 mg total) by mouth every 8 (eight) hours.   hydrOXYzine  25 MG tablet Commonly known as: ATARAX  Take 1 tablet (25 mg total) by mouth 3 (three) times daily as needed for anxiety.   pantoprazole  40 MG tablet Commonly known as: PROTONIX  Take 1 tablet (40 mg total) by mouth  daily.   polyethylene glycol 17 g packet Commonly known as: MIRALAX  / GLYCOLAX  Take 17 g by mouth daily.   rosuvastatin  20 MG tablet Commonly known as: CRESTOR  TAKE 1 TABLET(20 MG) BY MOUTH DAILY What changed: See the new instructions.               Durable Medical Equipment  (From admission, onward)           Start     Ordered   01/19/24 1419  For home use only DME Walker rolling  Once       Question Answer Comment  Walker: With 5 Inch Wheels   Patient needs a walker to treat with the following condition Generalized muscle  weakness      01/19/24 1418            Follow-up Information     [facility].   Specialty: Home Health Services Why: [facility] will provide PT and OT in the home after discharge. Contact information: [address] [place] [address] [phone]                Allergies  Allergen Reactions   Iodinated Contrast Media Itching and Other (See Comments)    9/1 developed diffuse itching after contrast dye for CT (without rash or airway involvement or GI symptoms) . Consider pretreatment before IV contrast in the future     Consultations:   Procedures:   Discharge Exam: BP (!) 115/58 (BP Location: Right Arm)   Pulse (!) 57   Temp 98.4 F (36.9 C) (Oral)   Resp 18   Ht 5' 10 (1.778 m)   Wt 85.3 kg   SpO2 100%   BMI 26.98 kg/m  Physical Exam Constitutional:      Appearance: Normal appearance.     Comments: Slowed mentation  HENT:     Head: Normocephalic and atraumatic.     Mouth/Throat:     Mouth: Mucous membranes are moist.  Eyes:     Extraocular Movements: Extraocular movements intact.  Cardiovascular:     Rate and Rhythm: Normal rate and regular rhythm.  Pulmonary:     Effort: Pulmonary effort is normal. No respiratory distress.     Breath sounds: Normal breath sounds. No wheezing.  Abdominal:     General: Bowel sounds are normal. There is no distension.     Palpations:  Abdomen is soft.     Tenderness: There is no abdominal tenderness.  Musculoskeletal:        General: Normal range of motion.     Cervical back: Normal range of motion and neck supple.  Skin:    General: Skin is warm and dry.  Neurological:     General: No focal deficit present.     Mental Status: She is alert.  Psychiatric:        Mood and Affect: Mood normal.      The results of significant diagnostics from this hospitalization (including imaging, microbiology, ancillary and laboratory) are listed below for reference.   Microbiology: Recent Results (from the past 240 hours)  Resp panel by RT-PCR (RSV, Flu A&B, Covid) Anterior Nasal Swab     Status: None   Collection Time: 01/11/24 11:00 PM   Specimen: Anterior Nasal Swab  Result Value Ref Range Status   SARS Coronavirus 2 by RT PCR NEGATIVE NEGATIVE Final    Comment: (NOTE) SARS-CoV-2 target nucleic acids are NOT DETECTED.  The SARS-CoV-2 RNA is generally detectable in upper respiratory specimens during the acute phase of infection. The lowest concentration of SARS-CoV-2 viral copies this assay can detect is 138 copies/mL. A negative result does not preclude SARS-Cov-2 infection and should not be used as the sole basis for treatment or other patient management decisions. A negative result may occur with  improper specimen collection/handling, submission of specimen other than nasopharyngeal swab, presence of viral mutation(s) within the areas targeted by this assay, and inadequate number of viral copies(<138 copies/mL). A negative result must be combined with clinical observations, patient history, and epidemiological information. The expected result is Negative.  Fact Sheet for Patients:  BloggerCourse.com  Fact Sheet for Healthcare Providers:  SeriousBroker.it  This test is no t yet approved  or cleared by the [place]  FDA and  has been authorized for detection  and/or diagnosis of SARS-CoV-2 by FDA under an Emergency Use Authorization (EUA). This EUA will remain  in effect (meaning this test can be used) for the duration of the COVID-19 declaration under Section 564(b)(1) of the Act, 21 U.S.C.section 360bbb-3(b)(1), unless the authorization is terminated  or revoked sooner.       Influenza A by PCR NEGATIVE NEGATIVE Final   Influenza B by PCR NEGATIVE NEGATIVE Final    Comment: (NOTE) The Xpert Xpress SARS-CoV-2/FLU/RSV plus assay is intended as an aid in the diagnosis of influenza from Nasopharyngeal swab specimens and should not be used as a sole basis for treatment. Nasal washings and aspirates are unacceptable for Xpert Xpress SARS-CoV-2/FLU/RSV testing.  Fact Sheet for Patients: BloggerCourse.com  Fact Sheet for Healthcare Providers: SeriousBroker.it  This test is not yet approved or cleared by the [place]  FDA and has been authorized for detection and/or diagnosis of SARS-CoV-2 by FDA under an Emergency Use Authorization (EUA). This EUA will remain in effect (meaning this test can be used) for the duration of the COVID-19 declaration under Section 564(b)(1) of the Act, 21 U.S.C. section 360bbb-3(b)(1), unless the authorization is terminated or revoked.     Resp Syncytial Virus by PCR NEGATIVE NEGATIVE Final    Comment: (NOTE) Fact Sheet for Patients: BloggerCourse.com  Fact Sheet for Healthcare Providers: SeriousBroker.it  This test is not yet approved or cleared by the [place]  FDA and has been authorized for detection and/or diagnosis of SARS-CoV-2 by FDA under an Emergency Use Authorization (EUA). This EUA will remain in effect (meaning this test can be used) for the duration of the COVID-19 declaration under Section 564(b)(1) of the Act, 21 U.S.C. section 360bbb-3(b)(1), unless the authorization is terminated  or revoked.  Performed at [facility], [address], [place], [place] [address]   Urine Culture     Status: Abnormal   Collection Time: 01/12/24  1:24 AM   Specimen: Urine, Random  Result Value Ref Range Status   Specimen Description   Final    URINE, RANDOM Performed at [facility], [address] [place], [place] [address]    Special Requests   Final    NONE Reflexed from [id number] Performed at [facility], [address], [place], [place] [address]    Culture >=100,000 COLONIES/mL ESCHERICHIA COLI (A)  Final   Report Status 01/14/2024 FINAL  Final   Organism ID, Bacteria ESCHERICHIA COLI (A)  Final      Susceptibility   Escherichia coli - MIC*    AMPICILLIN 4 SENSITIVE Sensitive     CEFAZOLIN (URINE) Value in next row Sensitive      2 SENSITIVEThis is a modified FDA-approved test that has been validated and its performance characteristics determined by the reporting laboratory.  This laboratory is certified under the Clinical Laboratory Improvement Amendments CLIA as qualified to perform high complexity clinical laboratory testing.    CEFEPIME Value in next row Sensitive      2 SENSITIVEThis is a modified FDA-approved test that has been validated and its performance characteristics determined by the reporting laboratory.  This laboratory is certified under the Clinical Laboratory Improvement Amendments CLIA as qualified to perform high complexity clinical laboratory testing.    ERTAPENEM Value in next row Sensitive      2 SENSITIVEThis is a modified FDA-approved test that has been validated and its performance characteristics determined by  the reporting laboratory.  This laboratory is certified under the Clinical Laboratory Improvement Amendments CLIA as qualified to perform high complexity clinical laboratory testing.    CEFTRIAXONE Value in next row Sensitive      2 SENSITIVEThis is a modified FDA-approved test that has  been validated and its performance characteristics determined by the reporting laboratory.  This laboratory is certified under the Clinical Laboratory Improvement Amendments CLIA as qualified to perform high complexity clinical laboratory testing.    CIPROFLOXACIN Value in next row Sensitive      2 SENSITIVEThis is a modified FDA-approved test that has been validated and its performance characteristics determined by the reporting laboratory.  This laboratory is certified under the Clinical Laboratory Improvement Amendments CLIA as qualified to perform high complexity clinical laboratory testing.    GENTAMICIN Value in next row Sensitive      2 SENSITIVEThis is a modified FDA-approved test that has been validated and its performance characteristics determined by the reporting laboratory.  This laboratory is certified under the Clinical Laboratory Improvement Amendments CLIA as qualified to perform high complexity clinical laboratory testing.    NITROFURANTOIN Value in next row Sensitive      2 SENSITIVEThis is a modified FDA-approved test that has been validated and its performance characteristics determined by the reporting laboratory.  This laboratory is certified under the Clinical Laboratory Improvement Amendments CLIA as qualified to perform high complexity clinical laboratory testing.    TRIMETH/SULFA Value in next row Sensitive      2 SENSITIVEThis is a modified FDA-approved test that has been validated and its performance characteristics determined by the reporting laboratory.  This laboratory is certified under the Clinical Laboratory Improvement Amendments CLIA as qualified to perform high complexity clinical laboratory testing.    AMPICILLIN/SULBACTAM Value in next row Sensitive      2 SENSITIVEThis is a modified FDA-approved test that has been validated and its performance characteristics determined by the reporting laboratory.  This laboratory is certified under the Clinical Laboratory  Improvement Amendments CLIA as qualified to perform high complexity clinical laboratory testing.    PIP/TAZO Value in next row Sensitive ug/mL     <=4 SENSITIVEThis is a modified FDA-approved test that has been validated and its performance characteristics determined by the reporting laboratory.  This laboratory is certified under the Clinical Laboratory Improvement Amendments CLIA as qualified to perform high complexity clinical laboratory testing.    MEROPENEM Value in next row Sensitive      <=4 SENSITIVEThis is a modified FDA-approved test that has been validated and its performance characteristics determined by the reporting laboratory.  This laboratory is certified under the Clinical Laboratory Improvement Amendments CLIA as qualified to perform high complexity clinical laboratory testing.    * >=100,000 COLONIES/mL ESCHERICHIA COLI     Labs: BNP (last 3 results) No results for input(s): BNP in the last 8760 hours. Basic Metabolic Panel: Recent Labs  Lab 01/13/24 0818 01/13/24 0819 01/14/24 0401  NA 140 140 141  K 3.9 3.9 3.5  CL 106 106 105  CO2 22 22 23   GLUCOSE 94 94 87  BUN 31* 30* 25*  CREATININE 1.05* 1.01* 0.87  CALCIUM  8.5* 8.5* 8.9  MG 2.1  --   --   PHOS  --  2.9  --    Liver Function Tests: Recent Labs  Lab 01/13/24 0819  ALBUMIN 3.6   No results for input(s): LIPASE, AMYLASE in the last 168 hours. No results for input(s): AMMONIA in the last 168  hours. CBC: Recent Labs  Lab 01/13/24 0818 01/14/24 0401  WBC 9.1 8.3  HGB 9.4* 9.7*  HCT 29.2* 31.9*  MCV 96.1 98.5  PLT 114* 120*   Cardiac Enzymes: No results for input(s): CKTOTAL, CKMB, CKMBINDEX, TROPONINI in the last 168 hours. BNP: Invalid input(s): POCBNP CBG: No results for input(s): GLUCAP in the last 168 hours. D-Dimer No results for input(s): DDIMER in the last 72 hours. Hgb A1c No results for input(s): HGBA1C in the last 72 hours. Lipid Profile No results for  input(s): CHOL, HDL, LDLCALC, TRIG, CHOLHDL, LDLDIRECT in the last 72 hours. Thyroid  function studies No results for input(s): TSH, T4TOTAL, T3FREE, THYROIDAB in the last 72 hours.  Invalid input(s): FREET3 Anemia work up No results for input(s): VITAMINB12, FOLATE, FERRITIN, TIBC, IRON, RETICCTPCT in the last 72 hours. Urinalysis    Component Value Date/Time   COLORURINE YELLOW 01/12/2024 0124   APPEARANCEUR HAZY (A) 01/12/2024 0124   LABSPEC 1.011 01/12/2024 0124   PHURINE 5.0 01/12/2024 0124   GLUCOSEU NEGATIVE 01/12/2024 0124   HGBUR SMALL (A) 01/12/2024 0124   BILIRUBINUR NEGATIVE 01/12/2024 0124   KETONESUR NEGATIVE 01/12/2024 0124   PROTEINUR 30 (A) 01/12/2024 0124   UROBILINOGEN 1.0 11/15/2010 2015   NITRITE NEGATIVE 01/12/2024 0124   LEUKOCYTESUR SMALL (A) 01/12/2024 0124   Sepsis Labs Recent Labs  Lab 01/13/24 0818 01/14/24 0401  WBC 9.1 8.3   Microbiology Recent Results (from the past 240 hours)  Resp panel by RT-PCR (RSV, Flu A&B, Covid) Anterior Nasal Swab     Status: None   Collection Time: 01/11/24 11:00 PM   Specimen: Anterior Nasal Swab  Result Value Ref Range Status   SARS Coronavirus 2 by RT PCR NEGATIVE NEGATIVE Final    Comment: (NOTE) SARS-CoV-2 target nucleic acids are NOT DETECTED.  The SARS-CoV-2 RNA is generally detectable in upper respiratory specimens during the acute phase of infection. The lowest concentration of SARS-CoV-2 viral copies this assay can detect is 138 copies/mL. A negative result does not preclude SARS-Cov-2 infection and should not be used as the sole basis for treatment or other patient management decisions. A negative result may occur with  improper specimen collection/handling, submission of specimen other than nasopharyngeal swab, presence of viral mutation(s) within the areas targeted by this assay, and inadequate number of viral copies(<138 copies/mL). A negative result must be  combined with clinical observations, patient history, and epidemiological information. The expected result is Negative.  Fact Sheet for Patients:  BloggerCourse.com  Fact Sheet for Healthcare Providers:  SeriousBroker.it  This test is no t yet approved or cleared by the [place]  FDA and  has been authorized for detection and/or diagnosis of SARS-CoV-2 by FDA under an Emergency Use Authorization (EUA). This EUA will remain  in effect (meaning this test can be used) for the duration of the COVID-19 declaration under Section 564(b)(1) of the Act, 21 U.S.C.section 360bbb-3(b)(1), unless the authorization is terminated  or revoked sooner.       Influenza A by PCR NEGATIVE NEGATIVE Final   Influenza B by PCR NEGATIVE NEGATIVE Final    Comment: (NOTE) The Xpert Xpress SARS-CoV-2/FLU/RSV plus assay is intended as an aid in the diagnosis of influenza from Nasopharyngeal swab specimens and should not be used as a sole basis for treatment. Nasal washings and aspirates are unacceptable for Xpert Xpress SARS-CoV-2/FLU/RSV testing.  Fact Sheet for Patients: BloggerCourse.com  Fact Sheet for Healthcare Providers: SeriousBroker.it  This test is not yet approved or cleared by the  [place]  FDA and has been authorized for detection and/or diagnosis of SARS-CoV-2 by FDA under an Emergency Use Authorization (EUA). This EUA will remain in effect (meaning this test can be used) for the duration of the COVID-19 declaration under Section 564(b)(1) of the Act, 21 U.S.C. section 360bbb-3(b)(1), unless the authorization is terminated or revoked.     Resp Syncytial Virus by PCR NEGATIVE NEGATIVE Final    Comment: (NOTE) Fact Sheet for Patients: BloggerCourse.com  Fact Sheet for Healthcare Providers: SeriousBroker.it  This test is not yet  approved or cleared by the [place]  FDA and has been authorized for detection and/or diagnosis of SARS-CoV-2 by FDA under an Emergency Use Authorization (EUA). This EUA will remain in effect (meaning this test can be used) for the duration of the COVID-19 declaration under Section 564(b)(1) of the Act, 21 U.S.C. section 360bbb-3(b)(1), unless the authorization is terminated or revoked.  Performed at [facility], [address] [place], [place] [address]   Urine Culture     Status: Abnormal   Collection Time: 01/12/24  1:24 AM   Specimen: Urine, Random  Result Value Ref Range Status   Specimen Description   Final    URINE, RANDOM Performed at [facility], [address], [place], [place] [address]    Special Requests   Final    NONE Reflexed from [id number] Performed at [facility], [address], [place], [place] [address]    Culture >=100,000 COLONIES/mL ESCHERICHIA COLI (A)  Final   Report Status 01/14/2024 FINAL  Final   Organism ID, Bacteria ESCHERICHIA COLI (A)  Final      Susceptibility   Escherichia coli - MIC*    AMPICILLIN 4 SENSITIVE Sensitive     CEFAZOLIN (URINE) Value in next row Sensitive      2 SENSITIVEThis is a modified FDA-approved test that has been validated and its performance characteristics determined by the reporting laboratory.  This laboratory is certified under the Clinical Laboratory Improvement Amendments CLIA as qualified to perform high complexity clinical laboratory testing.    CEFEPIME Value in next row Sensitive      2 SENSITIVEThis is a modified FDA-approved test that has been validated and its performance characteristics determined by the reporting laboratory.  This laboratory is certified under the Clinical Laboratory Improvement Amendments CLIA as qualified to perform high complexity clinical laboratory testing.    ERTAPENEM Value in next row Sensitive      2 SENSITIVEThis is a  modified FDA-approved test that has been validated and its performance characteristics determined by the reporting laboratory.  This laboratory is certified under the Clinical Laboratory Improvement Amendments CLIA as qualified to perform high complexity clinical laboratory testing.    CEFTRIAXONE Value in next row Sensitive      2 SENSITIVEThis is a modified FDA-approved test that has been validated and its performance characteristics determined by the reporting laboratory.  This laboratory is certified under the Clinical Laboratory Improvement Amendments CLIA as qualified to perform high complexity clinical laboratory testing.    CIPROFLOXACIN Value in next row Sensitive      2 SENSITIVEThis is a modified FDA-approved test that has been validated and its performance characteristics determined by the reporting laboratory.  This laboratory is certified under the Clinical Laboratory Improvement Amendments CLIA as qualified to perform high complexity clinical laboratory testing.    GENTAMICIN Value in next row Sensitive      2 SENSITIVEThis is a modified FDA-approved test that has  been validated and its performance characteristics determined by the reporting laboratory.  This laboratory is certified under the Clinical Laboratory Improvement Amendments CLIA as qualified to perform high complexity clinical laboratory testing.    NITROFURANTOIN Value in next row Sensitive      2 SENSITIVEThis is a modified FDA-approved test that has been validated and its performance characteristics determined by the reporting laboratory.  This laboratory is certified under the Clinical Laboratory Improvement Amendments CLIA as qualified to perform high complexity clinical laboratory testing.    TRIMETH/SULFA Value in next row Sensitive      2 SENSITIVEThis is a modified FDA-approved test that has been validated and its performance characteristics determined by the reporting laboratory.  This laboratory is certified under the  Clinical Laboratory Improvement Amendments CLIA as qualified to perform high complexity clinical laboratory testing.    AMPICILLIN/SULBACTAM Value in next row Sensitive      2 SENSITIVEThis is a modified FDA-approved test that has been validated and its performance characteristics determined by the reporting laboratory.  This laboratory is certified under the Clinical Laboratory Improvement Amendments CLIA as qualified to perform high complexity clinical laboratory testing.    PIP/TAZO Value in next row Sensitive ug/mL     <=4 SENSITIVEThis is a modified FDA-approved test that has been validated and its performance characteristics determined by the reporting laboratory.  This laboratory is certified under the Clinical Laboratory Improvement Amendments CLIA as qualified to perform high complexity clinical laboratory testing.    MEROPENEM Value in next row Sensitive      <=4 SENSITIVEThis is a modified FDA-approved test that has been validated and its performance characteristics determined by the reporting laboratory.  This laboratory is certified under the Clinical Laboratory Improvement Amendments CLIA as qualified to perform high complexity clinical laboratory testing.    * >=100,000 COLONIES/mL ESCHERICHIA COLI    Procedures/Studies: VAS US  LOWER EXTREMITY VENOUS (DVT) Result Date: 01/14/2024  Lower Venous DVT Study Patient Name:  [name]  Date of Exam:   01/12/2024 Medical Rec #: [id number]    Accession #:    [id number] Date of Birth: August 27, 1945   Patient Gender: F Patient Age:   7 years Exam Location:  [facility] Procedure:      VAS US  LOWER EXTREMITY VENOUS (DVT) Referring Phys: EVALENE OPYD --------------------------------------------------------------------------------  Indications: Edema, SOB, and Patient discharged 01/09/2024 after presenting with shortness of breath. Patient states she has not taken any of her medication since discharge. History of COPD and CHF (BNP 3000 this  admission).  Comparison Study: Prior right LEV done 12/05/23 Performing Technologist: Alberta Lis RVS  Examination Guidelines: A complete evaluation includes B-mode imaging, spectral Doppler, color Doppler, and power Doppler as needed of all accessible portions of each vessel. Bilateral testing is considered an integral part of a complete examination. Limited examinations for reoccurring indications may be performed as noted. The reflux portion of the exam is performed with the patient in reverse Trendelenburg.  +---------+---------------+---------+-----------+----------+--------------+ RIGHT    CompressibilityPhasicitySpontaneityPropertiesThrombus Aging +---------+---------------+---------+-----------+----------+--------------+ CFV      Partial        Yes      No                   Chronic        +---------+---------------+---------+-----------+----------+--------------+ SFJ      Full                                                        +---------+---------------+---------+-----------+----------+--------------+  FV Prox  Partial        Yes      No                   Chronic        +---------+---------------+---------+-----------+----------+--------------+ FV Mid   Partial        Yes      No                   Chronic        +---------+---------------+---------+-----------+----------+--------------+ FV DistalPartial        Yes      No                   Chronic        +---------+---------------+---------+-----------+----------+--------------+ PFV      Partial        Yes      No                   Chronic        +---------+---------------+---------+-----------+----------+--------------+ POP      Partial        No       Yes                  Chronic        +---------+---------------+---------+-----------+----------+--------------+ PTV      Partial                                      Chronic         +---------+---------------+---------+-----------+----------+--------------+ PERO     Partial                                      Chronic        +---------+---------------+---------+-----------+----------+--------------+   +---------+---------------+---------+-----------+----------+--------------+ LEFT     CompressibilityPhasicitySpontaneityPropertiesThrombus Aging +---------+---------------+---------+-----------+----------+--------------+ CFV      Full           Yes      Yes                                 +---------+---------------+---------+-----------+----------+--------------+ SFJ      Full                                                        +---------+---------------+---------+-----------+----------+--------------+ FV Prox  Full                                                        +---------+---------------+---------+-----------+----------+--------------+ FV Mid   Full                                                        +---------+---------------+---------+-----------+----------+--------------+ FV DistalFull                                                        +---------+---------------+---------+-----------+----------+--------------+  PFV      Full           Yes      No                                  +---------+---------------+---------+-----------+----------+--------------+ POP      Full           Yes      No                                  +---------+---------------+---------+-----------+----------+--------------+ PTV      Full                                                        +---------+---------------+---------+-----------+----------+--------------+ PERO     Full                                                        +---------+---------------+---------+-----------+----------+--------------+     Summary: RIGHT: - 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  - Findings appear essentially unchanged compared to previous examination. - No cystic structure found in the popliteal fossa.  LEFT: - There is no evidence of deep vein thrombosis in the lower extremity.  - No cystic structure found in the popliteal fossa.  *See table(s) above for measurements and observations. Electronically signed by Gaile New MD on 01/14/2024 at 8:23:03 PM.    Final    ECHOCARDIOGRAM COMPLETE Result Date: 01/12/2024    ECHOCARDIOGRAM REPORT   Patient Name:   [name] Date of Exam: 01/12/2024 Medical Rec #:  [id number]   Height:       70.0 in Accession #:    [id number]  Weight:       202.6 lb Date of Birth:  09/07/1945  BSA:          2.099 m Patient Age:    77 years    BP:           143/98 mmHg Patient Gender: F           HR:           59 bpm. Exam Location:  Inpatient Procedure: Cardiac Doppler, Color Doppler and Intracardiac Opacification Agent            (Both Spectral and Color Flow Doppler were utilized during            procedure). Indications:    CHF  History:        Patient has prior history of Echocardiogram examinations, most                 recent 09/12/2020. CHF, COPD; Risk Factors:Hypertension and                 Dyslipidemia.  Sonographer:    Philomena Daring Referring Phys: [id number] TIMOTHY S OPYD IMPRESSIONS  1. Left ventricular ejection fraction, by estimation,  is 60 to 65%. The left ventricle has normal function. The left ventricle has no regional wall motion abnormalities. Left ventricular diastolic parameters are indeterminate.  2. Right ventricular systolic function is normal. The right ventricular size is normal. Tricuspid regurgitation signal is inadequate for assessing PA pressure.  3. The mitral valve is grossly normal. No evidence of mitral valve regurgitation. No evidence of mitral stenosis.  4. The aortic valve is tricuspid. Aortic valve regurgitation  is not visualized. Aortic valve sclerosis is present, with no evidence of aortic valve stenosis.  5. The inferior vena cava is normal in size with greater than 50% respiratory variability, suggesting right atrial pressure of 3 mmHg. Conclusion(s)/Recommendation(s): No left ventricular mural or apical thrombus/thrombi. FINDINGS  Left Ventricle: Left ventricular ejection fraction, by estimation, is 60 to 65%. The left ventricle has normal function. The left ventricle has no regional wall motion abnormalities. Definity  contrast agent was given IV to delineate the left ventricular  endocardial borders. The left ventricular internal cavity size was normal in size. There is no left ventricular hypertrophy. Left ventricular diastolic parameters are indeterminate. Right Ventricle: The right ventricular size is normal. No increase in right ventricular wall thickness. Right ventricular systolic function is normal. Tricuspid regurgitation signal is inadequate for assessing PA pressure. Left Atrium: Left atrial size was normal in size. Right Atrium: Right atrial size was normal in size. Pericardium: There is no evidence of pericardial effusion. Mitral Valve: The mitral valve is grossly normal. There is mild thickening of the mitral valve leaflet(s). There is mild calcification of the mitral valve leaflet(s). Mild mitral annular calcification. No evidence of mitral valve regurgitation. No evidence of mitral valve stenosis. Tricuspid Valve: The tricuspid valve is grossly normal. Tricuspid valve regurgitation is not demonstrated. No evidence of tricuspid stenosis. Aortic Valve: The aortic valve is tricuspid. Aortic valve regurgitation is not visualized. Aortic valve sclerosis is present, with no evidence of aortic valve stenosis. Pulmonic Valve: The pulmonic valve was grossly normal. Pulmonic valve regurgitation is not visualized. No evidence of pulmonic stenosis. Aorta: The aortic root and ascending aorta are structurally normal,  with no evidence of dilitation. Venous: The inferior vena cava is normal in size with greater than 50% respiratory variability, suggesting right atrial pressure of 3 mmHg. IAS/Shunts: The atrial septum is grossly normal.  LEFT VENTRICLE PLAX 2D LVIDd:         5.40 cm   Diastology LVIDs:         4.40 cm   LV e' lateral:   3.70 cm/s LV PW:         1.10 cm   LV E/e' lateral: 14.1 LV IVS:        1.20 cm LVOT diam:     2.10 cm LV SV:         73 LV SV Index:   35 LVOT Area:     3.46 cm  RIGHT VENTRICLE             IVC RV S prime:     17.50 cm/s  IVC diam: 1.60 cm TAPSE (M-mode): 2.8 cm LEFT ATRIUM             Index        RIGHT ATRIUM           Index LA diam:        3.40 cm 1.62 cm/m   RA Area:     15.80 cm LA Vol (A2C):   39.5 ml 18.82 ml/m  RA Volume:  41.50 ml  19.77 ml/m LA Vol (A4C):   55.8 ml 26.59 ml/m LA Biplane Vol: 51.4 ml 24.49 ml/m  AORTIC VALVE LVOT Vmax:   96.70 cm/s LVOT Vmean:  62.200 cm/s LVOT VTI:    0.210 m  AORTA Ao Root diam: 3.00 cm Ao Asc diam:  3.50 cm MITRAL VALVE MV Area (PHT): 2.04 cm     SHUNTS MV Decel Time: 372 msec     Systemic VTI:  0.21 m MV E velocity: 52.10 cm/s   Systemic Diam: 2.10 cm MV A velocity: 109.00 cm/s MV E/A ratio:  0.48 Sunit Tolia Electronically signed by Madonna Large Signature Date/Time: 01/12/2024/6:35:47 PM    Final    CT Angio Chest PE W and/or Wo Contrast Result Date: 01/12/2024 CLINICAL DATA:  Shortness of breath. EXAM: CT ANGIOGRAPHY CHEST WITH CONTRAST TECHNIQUE: Multidetector CT imaging of the chest was performed using the standard protocol during bolus administration of intravenous contrast. Multiplanar CT image reconstructions and MIPs were obtained to evaluate the vascular anatomy. RADIATION DOSE REDUCTION: This exam was performed according to the departmental dose-optimization program which includes automated exposure control, adjustment of the mA and/or kV according to patient size and/or use of iterative reconstruction technique. CONTRAST:  75mL  OMNIPAQUE  IOHEXOL  350 MG/ML SOLN COMPARISON:  01/07/2024 FINDINGS: Cardiovascular: The heart size is upper normal to borderline enlarged. No substantial pericardial effusion. Coronary artery calcification is evident. Moderate atherosclerotic calcification is noted in the wall of the thoracic aorta. Stable appearance of the focal outpouching from the inferior wall of the transverse aorta suggesting ulcerated plaque or penetrating ulcer. Previously described dilation of the distal transverse/proximal descending thoracic aorta up to 3.6 cm, stable. There is no filling defect within the opacified pulmonary arteries to suggest the presence of an acute pulmonary embolus. Mediastinum/Nodes: Similar mild mediastinal lymphadenopathy including 11 mm short axis prevascular node on 37/4 and 11 mm short axis subcarinal node on 42/4. Small hiatal hernia. The esophagus has normal imaging features. There is no axillary lymphadenopathy. Lungs/Pleura: Centrilobular emphsyema noted. Chronic atelectasis/scarring noted superior segment left lower lobe. Stable 10 mm subpleural nodule left lower lobe on 108/6. Calcified pleural plaques noted left hemithorax. Upper Abdomen: Visualized portion of the upper abdomen shows no acute findings. Musculoskeletal: No worrisome lytic or sclerotic osseous abnormality. Review of the MIP images confirms the above findings. IMPRESSION: 1. No CT evidence for acute pulmonary embolus. 2. 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 Recommend annual imaging followup by CTA or MRA. This recommendation follows 2010 ACCF/AHA/AATS/ACR/ASA/SCA/SCAI/SIR/STS/SVM Guidelines for the Diagnosis and Management of Patients with Thoracic Aortic Disease. Circulation. 2010; 121: Z733-z630. Aortic aneurysm NOS (ICD10-I71.9) 3. No change in focal outpouching from the inferior wall of the transverse aorta suggesting ulcerated plaque or penetrating ulcer. 4. Stable 10 mm subpleural nodule left lower lobe.  Attention on follow-up recommended. 5. Stable mild mediastinal lymphadenopathy. 6. Small hiatal hernia. 7. Aortic Atherosclerosis (ICD10-I70.0) and Emphysema (ICD10-J43.9). Electronically Signed   By: Camellia Candle M.D.   On: 01/12/2024 05:09   DG Chest 2 View Result Date: 01/11/2024 CLINICAL DATA:  SOB EXAM: CHEST - 2 VIEW COMPARISON:  CT angio chest 01/07/2024, chest x-ray 01/07/2024 FINDINGS: The heart and mediastinal contours are unchanged. Atherosclerotic plaque. Persistent left lower to mid lung zone airspace opacity. No pulmonary edema. Persistent blunting of the left costophrenic angle with calcified pleural plaques. No definite left pleural effusion. No right pleural effusion. No pneumothorax. No acute osseous abnormality. IMPRESSION: 1. Persistent left lower to mid lung zone  airspace opacity. Finding better evaluated on CT angiography chest 01/07/2024 2. Persistent blunting of the left costophrenic angle with calcified pleural plaques. No definite left pleural effusion. Electronically Signed   By: Morgane  Naveau M.D.   On: 01/11/2024 23:48   CT Angio Chest PE W/Cm &/Or Wo Cm Result Date: 01/07/2024 CLINICAL DATA:  High probability pulmonary embolism, dyspnea, chest pain EXAM: CT ANGIOGRAPHY CHEST WITH CONTRAST TECHNIQUE: Multidetector CT imaging of the chest was performed using the standard protocol during bolus administration of intravenous contrast. Multiplanar CT image reconstructions and MIPs were obtained to evaluate the vascular anatomy. RADIATION DOSE REDUCTION: This exam was performed according to the departmental dose-optimization program which includes automated exposure control, adjustment of the mA and/or kV according to patient size and/or use of iterative reconstruction technique. CONTRAST:  75mL OMNIPAQUE  IOHEXOL  350 MG/ML SOLN COMPARISON:  12/10/2023 FINDINGS: Cardiovascular: Moderate multi-vessel coronary artery calcification. Cardiac size is within normal limits. Adequate  opacification of the pulmonary arterial tree. No intraluminal filling defect identified to suggest acute pulmonary embolism. Central pulmonary arteries are of normal caliber. No pericardial effusion. Moderate atherosclerotic calcification within the thoracic aorta. 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 Mediastinum/Nodes: Visualized thyroid  is unremarkable. She esophagus is unremarkable. Moderate hiatal hernia. Shotty mediastinal adenopathy has developed, nonspecific, possibly reactive in nature. No frankly pathologic adenopathy is identified. Lungs/Pleura: And like parenchymal scarring within the posterior basal left lower lobe with associated left-sided volume loss. Calcified pleural plaques are identified on the left, likely the sequela of remote trauma or inflammation. Interval development of bronchial wall thickening in keeping with airway inflammation, most evident within the central airways of the mid and lower lung zones. No confluent pulmonary infiltrate. Mild emphysema. No pneumothorax or pleural effusion. Bronchomalacia involving the bronchus intermedius Upper Abdomen: No acute abnormality. Musculoskeletal: No chest wall abnormality. No acute or significant osseous findings. Review of the MIP images confirms the above findings. IMPRESSION: 1. No pulmonary embolism. 2. Moderate multi-vessel coronary artery calcification. 3. 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 Recommend annual imaging followup by CTA or MRA. This recommendation follows 2010 ACCF/AHA/AATS/ACR/ASA/SCA/SCAI/SIR/STS/SVM Guidelines for the Diagnosis and Management of Patients with Thoracic Aortic Disease. Circulation. 2010; 121: Z733-z630. Aortic aneurysm NOS (ICD10-I71.9) 4. 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 5. Interval development of bronchial wall thickening in keeping with airway inflammation, most evident within the central airways of the mid and lower lung zones. 6. Bronchomalacia involving the bronchus intermedius. 7. Mild emphysema. 8. Moderate hiatal hernia. Aortic Atherosclerosis (ICD10-I70.0) and Emphysema (ICD10-J43.9). Electronically Signed   By: Dorethia Molt M.D.   On: 01/07/2024 23:16   DG Chest Port 1 View Result Date: 01/07/2024 CLINICAL DATA:  Shortness of breath. EXAM: PORTABLE CHEST 1 VIEW COMPARISON:  04/13/2018, chest CT 12/10/2023 FINDINGS: Patient is rotated. The heart is normal in size. Stable mediastinal contours allowing for rotation. Aortic atherosclerosis. Again seen elevation of right hemidiaphragm. Left pleural calcifications. No acute airspace disease, large pleural effusion or pneumothorax. No pulmonary edema. IMPRESSION: 1. No acute chest findings. 2. Chronic left pleural calcifications. Electronically Signed   By: Andrea Gasman M.D.   On: 01/07/2024 21:49     Time coordinating discharge: Over 30 minutes    Alm Apo,  MD  [facility] 01/19/2024, 5:28 PM

## 2024-01-19 NOTE — TOC Progression Note (Addendum)
 Transition of Care [facility]) - Progression Note   Patient Details  Name: [name] MRN: [id number] Date of Birth: 01-18-1946  Transition of Care [facility]) CM/SW Contact  Duwaine GORMAN Aran, LCSW Phone Number: 01/19/2024, 2:31 PM  Clinical Narrative: CSW notified patient's rolling walker at home is broken. Daughter confirmed the patient got it from a family friend and patient has not received a walker through insurance before. CSW made DME referral to Zack with [employer]. [employer] to deliver rolling walker to patient's room. Treatment team updated.  Addendum: CSW notified by Zack with [employer] that patient received a wheelchair through insurance last month (01/05/24) and is not eligible for a rolling walker through insurance. CSW updated daughter and treatment team.  Addendum #2: CSW notified by RN that daughter and [name] with [facility] want the patient to go to rehab. CSW spoke with patient and patient confirmed she does not want to go to rehab. CSW updated daughter and explained that patient has to agree to rehab in order for Care Management to refer her out.  Expected Discharge Plan: Home w Home Health Services Barriers to Discharge: Barriers Resolved  Expected Discharge Plan and Services In-house Referral: Clinical Social Work Post Acute Care Choice: Home Health Living arrangements for the past 2 months: Skilled Nursing Facility Expected Discharge Date: 01/19/24               DME Arranged: Vannie rolling DME Agency: [employer] Date DME Agency Contacted: 01/19/24 Time DME Agency Contacted: [phone] Representative spoke with at DME Agency: Zack HH Arranged: PT, OT, Refused SNF HH Agency: [facility] Date [facility] Agency Contacted: 01/15/24 Time HH Agency Contacted: 1415 Representative spoke with at [facility] Agency: [place]  Social Drivers of Health (SDOH) Interventions SDOH Screenings   Food Insecurity: No Food Insecurity (01/12/2024)  Housing: Low Risk  (01/12/2024)  Transportation Needs: Unmet  Transportation Needs (01/12/2024)  Utilities: Not At Risk (01/12/2024)  Depression (PHQ2-9): Low Risk  (06/04/2021)  Social Connections: Socially Isolated (01/12/2024)  Tobacco Use: Medium Risk (01/12/2024)   Readmission Risk Interventions    01/15/2024    2:24 PM  Readmission Risk Prevention Plan  Transportation Screening Complete  HRI or Home Care Consult Complete  Social Work Consult for Recovery Care Planning/Counseling Complete  Palliative Care Screening Not Applicable  Medication Review Oceanographer) Complete

## 2024-01-19 NOTE — TOC CM/SW Note (Signed)
 CSW received call from Hildreth Sharps with [facility] requesting name and number for patient's daughter. CSW provided contact information.

## 2024-01-19 NOTE — Progress Notes (Signed)
 Pt still waiting to be discharge home. As per report from previous shift, pt daughter went to buy walker/wheelchair in [employer] since 1600 but has not cameback yet. This RN has called daughter multiple times but it goes straight to voicemail. Pt is resting comfortably in bed but is very upset of the situation. CN Philippe and NP Andrez made aware. Will continue to monitor pt.

## 2024-01-19 NOTE — Progress Notes (Signed)
 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   Patient did not take any of her medications since she was discharged and developed recurrent shortness of breath.  proBNP was elevated (3,648).   She responded well to diuresis with IV Lasix . She was evaluated by PT and recommended for rehab at discharge. She declined and has asked to go home with [facility].     Assessment & Plan:  Acute on chronic HFpEF  - Echo repeated during hospitalization, EF 60 to 65%, no RWMA, indeterminate diastolic parameters - Diuresed well with IV Lasix .   - Continue oral Lasix   Physical debility; frequent falls  Cognitive impairment  - PT recommending rehab at discharge; patient has declined this multiple times, wishing to return home - May also need to consider long-term care at some point - APS referral was placed on 9/11 due to difficulty getting in touch with family and having some concerns about discharge plans; daughter came by the hospital later that afternoon and clarified plan.  Patient still declining rehab at that time and plan was to take patient home with family on 9/12 - if patient changes mind, we can pursue SNF, however currently declining; if family is adamant on SNF while patient declining, I would then next proceed with a capacity evaluation for patient before more discussions with family   Elevated D-dimer  Chronic RLE DVT -Elevated D-dimer on admission; possibly in setting of underlying chronic lower extremity DVT - Repeat CT angio chest showed no PE -Lower extremity  duplex shows chronic right lower extremity DVT; not an AC candidate due to hx SAH and IVH   Elevated troponin   - suspected demand ischemia in setting of CHF exac   COPD  - Not in exacerbation  - Continue short-acting bronchodilators as-needed    Hypertension  - Continue Norvasc  and hydralazine    Hx of CVA  - Crestor      Hypokalemia  - Replacing    Anemia; thrombocytopenia  - Bone marrow biopsy from 12/13/23 did not reveal etiology and she is planning to follow-up with hematology/oncology, Dr. Lanny  - Counts appear stable with no overt bleeding    Constipation - laxatives as needed  Interval History:    Old records reviewed in assessment of this patient  Antimicrobials: Some anxiety overnight and feeling short of breath.  Given cough medication, nebs, Lasix .  Comfortable this morning.  Some shallow breathing which seems to be anxiety driven otherwise doing okay.  Still stable for discharging. Plan was for going home with her daughter today, however when they arrived this afternoon, now they are asking for rehab.  Patient however is still declining rehab.  APS referral has been made. Unfortunately unable to obtain walker either as she has already used [employer] for a wheelchair last month.  DVT prophylaxis:  heparin  injection 5,000 Units Start: 01/12/24 0600   Code Status:   Code Status: Full Code  Mobility Assessment (Last 72 Hours)     Mobility Assessment     Row Name 01/19/24 0800 01/18/24 2240 01/18/24 1708 01/18/24 1557  01/18/24 0800   Does the patient have exclusion criteria? No - Perform mobility assessment No - Perform mobility assessment -- -- No - Perform mobility assessment   What is the highest level of mobility based on the mobility assessment? Level 2 (Chairfast) - Balance while sitting on edge of bed and cannot stand Level 3 (Stands with assistance) - Balance while standing  and cannot march in place Level 3 (Stands with assistance) - Balance while standing  and  cannot march in place Level 3 (Stands with assistance) - Balance while standing  and cannot march in place Level 2 (Chairfast) - Balance while sitting on edge of bed and cannot stand   Is the above level different from baseline mobility prior to current illness? Yes - Recommend PT order Yes - Recommend PT order -- -- Yes - Recommend PT order    Row Name 01/18/24 0020 01/17/24 0855 01/17/24 0024       Does the patient have exclusion criteria? No - Perform mobility assessment No - Perform mobility assessment No - Perform mobility assessment     What is the highest level of mobility based on the mobility assessment? Level 4 (Ambulates with assistance) - Balance while stepping forward/back - Complete Level 4 (Ambulates with assistance) - Balance while stepping forward/back - Complete Level 4 (Ambulates with assistance) - Balance while stepping forward/back - Complete     Is the above level different from baseline mobility prior to current illness? -- Yes - Recommend PT order --        Barriers to discharge: None Disposition Plan: Home with home health HH orders placed: n/a Status is: Inpt  Objective: Blood pressure (!) 115/58, pulse (!) 57, temperature 98.4 F (36.9 C), temperature source Oral, resp. rate 18, height 5' 10 (1.778 m), weight 85.3 kg, SpO2 100%.  Examination:  Physical Exam Constitutional:      Appearance: Normal appearance.     Comments: Slowed mentation  HENT:     Head: Normocephalic and atraumatic.     Mouth/Throat:     Mouth: Mucous membranes are moist.  Eyes:     Extraocular Movements: Extraocular movements intact.  Cardiovascular:     Rate and Rhythm: Normal rate and regular rhythm.  Pulmonary:     Effort: Pulmonary effort is normal. No respiratory distress.     Breath sounds: Normal breath sounds. No wheezing.  Abdominal:     General: Bowel sounds are normal. There is no distension.     Palpations: Abdomen is soft.     Tenderness: There is no abdominal  tenderness.  Musculoskeletal:        General: Normal range of motion.     Cervical back: Normal range of motion and neck supple.  Skin:    General: Skin is warm and dry.  Neurological:     General: No focal deficit present.     Mental Status: She is alert.  Psychiatric:        Mood and Affect: Mood normal.      Consultants:    Procedures:    Data Reviewed: No results found for this or any previous visit (from the past 24 hours).  I have reviewed pertinent nursing notes, vitals, labs, and images as necessary. I have ordered labwork to follow up on as indicated.  I have reviewed the last notes from staff over past 24 hours. I have discussed patient's care plan and test results with nursing staff, CM/SW, and other staff as appropriate.  Time spent:  Greater than 50% of the 55 minute visit was spent in counseling/coordination of care for the patient as laid out in the A&P.   LOS: 7 days   Alm Apo, MD [facility] 01/19/2024, 4:47 PM

## 2024-01-19 NOTE — Progress Notes (Signed)
 Discharge meds in a secure bag delivered to pt in room by this RN

## 2024-01-20 ENCOUNTER — Emergency Department (HOSPITAL_COMMUNITY)
Admission: EM | Admit: 2024-01-20 | Discharge: 2024-01-22 | Disposition: A | Discharge status: Home / Self Care | Attending: Emergency Medicine | Admitting: Emergency Medicine

## 2024-01-20 DIAGNOSIS — Z79899 Other long term (current) drug therapy: Secondary | ICD-10-CM | POA: Diagnosis not present

## 2024-01-20 DIAGNOSIS — Z046 Encounter for general psychiatric examination, requested by authority: Secondary | ICD-10-CM | POA: Diagnosis not present

## 2024-01-20 DIAGNOSIS — I5033 Acute on chronic diastolic (congestive) heart failure: Secondary | ICD-10-CM | POA: Insufficient documentation

## 2024-01-20 DIAGNOSIS — Z8673 Personal history of transient ischemic attack (TIA), and cerebral infarction without residual deficits: Secondary | ICD-10-CM | POA: Insufficient documentation

## 2024-01-20 DIAGNOSIS — J449 Chronic obstructive pulmonary disease, unspecified: Secondary | ICD-10-CM | POA: Insufficient documentation

## 2024-01-20 DIAGNOSIS — Z008 Encounter for other general examination: Secondary | ICD-10-CM | POA: Diagnosis present

## 2024-01-20 DIAGNOSIS — I110 Hypertensive heart disease with heart failure: Secondary | ICD-10-CM | POA: Diagnosis not present

## 2024-01-20 DIAGNOSIS — I5022 Chronic systolic (congestive) heart failure: Secondary | ICD-10-CM

## 2024-01-20 MED ORDER — ROSUVASTATIN CALCIUM 20 MG PO TABS
20.0000 mg | ORAL_TABLET | Freq: Every day | ORAL | Status: DC
  Administered 2024-01-21: 20 mg via ORAL
  Filled 2024-01-20 (×2): qty 1

## 2024-01-20 MED ORDER — POLYETHYLENE GLYCOL 3350 17 G PO PACK
17.0000 g | PACK | Freq: Every day | ORAL | Status: DC
  Administered 2024-01-21: 17 g via ORAL
  Filled 2024-01-20 (×2): qty 1

## 2024-01-20 MED ORDER — FUROSEMIDE 40 MG PO TABS
40.0000 mg | ORAL_TABLET | Freq: Every day | ORAL | Status: DC
  Administered 2024-01-21: 40 mg via ORAL
  Filled 2024-01-20 (×2): qty 1

## 2024-01-20 MED ORDER — PANTOPRAZOLE SODIUM 40 MG PO TBEC
40.0000 mg | DELAYED_RELEASE_TABLET | Freq: Every day | ORAL | Status: DC
  Administered 2024-01-21: 40 mg via ORAL
  Filled 2024-01-20 (×2): qty 1

## 2024-01-20 MED ORDER — ALBUTEROL SULFATE HFA 108 (90 BASE) MCG/ACT IN AERS: 2.0000 | INHALATION_SPRAY | Freq: Four times a day (QID) | RESPIRATORY_TRACT | Status: DC | PRN

## 2024-01-20 MED ORDER — HYDROXYZINE HCL 25 MG PO TABS: 25.0000 mg | ORAL_TABLET | Freq: Three times a day (TID) | ORAL | Status: DC | PRN

## 2024-01-20 MED ORDER — HYDRALAZINE HCL 25 MG PO TABS
50.0000 mg | ORAL_TABLET | Freq: Three times a day (TID) | ORAL | Status: DC
  Administered 2024-01-20 – 2024-01-22 (×4): 50 mg via ORAL
  Filled 2024-01-20 (×5): qty 2

## 2024-01-20 MED ORDER — AMLODIPINE BESYLATE 5 MG PO TABS
10.0000 mg | ORAL_TABLET | Freq: Every day | ORAL | Status: DC
  Administered 2024-01-21: 10 mg via ORAL
  Filled 2024-01-20 (×2): qty 2

## 2024-01-20 NOTE — Progress Notes (Signed)
 Spoke with daughter at bedside. She verbalized going to get patients walker when she is leaving the hospital. Medication received yesterday. Went over the AVS with daughter and arranging for transport to come pick up patient to take home. Ensured daughter knew that someone needed to be home for when patient arrives. She stated that her nephew and sister were at the house and she was going to get walker now. Left with AVS. Awaiting PTAR.

## 2024-01-20 NOTE — TOC Progression Note (Addendum)
 Transition of Care [facility]) - Progression Note    Patient Details  Name: [name] MRN: [id number] Date of Birth: October 24, 1945  Transition of [facility]) CM/SW Contact  Sonda Manuella Quill, RN Phone Number: 01/20/2024, 12:32 PM  Clinical Narrative:    D/C orders received; pt asleep in room; no family at bedside; LVM for her dtr [name] [phone]); awaiting return call; spoke w/ pt's son [name] [phone]); her son pt was to d/c home w/ family; Mr Mack said he does not speak w/ his family in [place], and he does not have phone #'s for other POC; Dr Patsy notified.  [phone] notified by Faith, RN PTAR has been called for transport; see note; no TOC needs. Expected Discharge Plan: Home w Home Health Services Barriers to Discharge: Barriers Resolved               Expected Discharge Plan and Services In-house Referral: Clinical Social Work   Post Acute Care Choice: Home Health Living arrangements for the past 2 months: Skilled Nursing Facility Expected Discharge Date: 01/19/24               DME Arranged: Vannie rolling DME Agency: [employer] Date DME Agency Contacted: 01/19/24 Time DME Agency Contacted: [phone] Representative spoke with at DME Agency: Zack HH Arranged: PT, OT, Refused SNF HH Agency: [facility] Date [facility] Agency Contacted: 01/15/24 Time HH Agency Contacted: 1415 Representative spoke with at [facility] Agency: [facility]   Social Drivers of Health (SDOH) Interventions SDOH Screenings   Food Insecurity: No Food Insecurity (01/12/2024)  Housing: Low Risk  (01/12/2024)  Transportation Needs: Unmet Transportation Needs (01/12/2024)  Utilities: Not At Risk (01/12/2024)  Depression (PHQ2-9): Low Risk  (06/04/2021)  Social Connections: Socially Isolated (01/12/2024)  Tobacco Use: Medium Risk (01/12/2024)    Readmission Risk Interventions    01/15/2024    2:24 PM  Readmission Risk Prevention Plan  Transportation Screening Complete  HRI or Home Care  Consult Complete  Social Work Consult for Recovery Care Planning/Counseling Complete  Palliative Care Screening Not Applicable  Medication Review Oceanographer) Complete
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
## 2024-01-20 NOTE — Plan of Care (Signed)
  Problem: Education: Goal: Knowledge of General Education information will improve Description: Including pain rating scale, medication(s)/side effects and non-pharmacologic comfort measures Outcome: Progressing   Problem: Health Behavior/Discharge Planning: Goal: Ability to manage health-related needs will improve Outcome: Not Progressing   

## 2024-01-20 NOTE — Progress Notes (Signed)
 Leaving with PTAR, attempted to call daughter, went to voicemail. No response

## 2024-01-21 ENCOUNTER — Other Ambulatory Visit: Payer: Self-pay

## 2024-01-21 DIAGNOSIS — Z046 Encounter for general psychiatric examination, requested by authority: Secondary | ICD-10-CM

## 2024-01-21 DIAGNOSIS — Z008 Encounter for other general examination: Secondary | ICD-10-CM

## 2024-01-21 NOTE — Consult Note (Signed)
 [facility] Psychiatric Consult Initial  Patient Name: .[name]  MRN: [id number]  DOB: [birth year]  Consult Order details:  Orders (From admission, onward)     Start     Ordered   01/21/24 0922  CONSULT TO CALL ACT TEAM       Ordering Provider: Jacquetta Sharlot GRADE, NP  Provider:  (Not yet assigned)  Question:  Reason for Consult?  Answer:  capacity by NP   01/21/24 0921             Mode of Visit: In person    Psychiatry Consult Evaluation  Service Date: January 21, 2024 LOS:  LOS: 0 days  Chief Complaint Capacity evaluation related to refusal of skilled nursing facility (SNF) placement  Primary Psychiatric Diagnoses  Evaluation by psychiatric service required  Assessment  [name] is a 78 y.o. female admitted: Presented to the EDfor 01/20/2024  8:06 PM for capacity evaluation related to refusal of skilled nursing facility (SNF) placement. 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 A psychiatric capacity evaluation order was placed due to the patient declining services from a skilled nursing facility despite recommendations. She had previously refused SNF placement during her inpatient admission and opted to return home. There was a question of capacity; however, no provider has explicitly documented that the patient lacks decision-making capacity. The patient is alert, oriented, and not cognitively impaired. She demonstrates the ability to understand, appreciate, and communicate her decisions regarding her care and living situation. There is no evidence of psychiatric illness impairing her judgment.  From a psychiatric standpoint, the patient does NOT lack capacity to  make her own decisions and is psychiatrically cleared. Please see plan below for detailed recommendations.   Diagnoses:  Active Hospital problems: Active Problems:   Evaluation by psychiatric service required    Plan   ## Psychiatric Medication Recommendations:  Patient not on any psychiatric medications  ## Medical Decision Making Capacity: Not specifically addressed in this encounter  ## Further Work-up:  -- No further workup needed at this time -- most recent EKG on 01/11/2024 had QtC of 488 -- Pertinent labwork reviewed earlier this admission includes: CBC, UA, EKG, UDS  ## Disposition:-- From a psychiatric standpoint, the patient does NOT lack capacity to make her own decisions and is psychiatrically cleared.  ## Behavioral / Environmental: - No specific recommendations at this time.     ## Safety and Observation Level:  - Based on my clinical evaluation, I estimate the patient to be at no risk of self harm in the current setting. - At this time, we recommend  routine. This decision is based on my review of the chart including patient's history and current presentation, interview of the patient, mental status examination, and consideration of suicide risk including evaluating suicidal ideation, plan, intent, suicidal or self-harm behaviors, risk factors, and protective factors. This judgment is based on our ability to directly address suicide risk, implement suicide prevention strategies, and develop a safety plan while the patient is in the clinical setting. Please contact our team if there is a concern that risk level has changed.  CSSR Risk Category:C-SSRS RISK CATEGORY: No Risk  Suicide Risk Assessment: Patient has following modifiable risk factors for suicide:  none, which we are addressing by psychiatrically clearing patient. Patient has following non-modifiable or demographic risk factors for suicide: none Patient has the following protective factors against suicide:  Supportive family  Thank you for this consult request. Recommendations have been communicated to the primary team.  We will sign off at this time.   CATHALEEN ADAM, PMHNP       History of Present Illness  Relevant Aspects of Hospital ED Course:  Admitted on 01/20/2024 for Capacity evaluation related to refusal of skilled nursing facility (SNF) placement.  Patient Report:  Patient was admitted to the hospital for acute on chronic heart failure with preserved ejection fraction. A psychiatric capacity evaluation order was placed due to the patient declining services from a skilled nursing facility despite recommendations. She had previously refused SNF placement during her inpatient admission and opted to return home. There was a question of capacity; however, no provider has explicitly documented that the patient lacks decision-making capacity.  Mental Status & Assessment: On evaluation, the patient was observed sitting up, pleasant and cooperative, eating graham crackers, and watching television. She was alert and oriented 4, correctly stating her name, current location, president, and year. She reports that she has a children and she currently lives with her grandson.  The patient denies any cognitive issues, has no history of dementia, and reports no psychiatric history or past substance use. She is adamant about her desire to return home rather than go to a SNF.  Patient reports that when [facility] transported her home previously, they did not leave her there due to the home being without electricity. She explains that her daughter has started a new job and will have the electricity turned back on. Patient understands that there is currently no electricity and verbalized her willingness to go home despite this circumstance.  She reports she manages her own finances, keeps a checking account at [employer], and receives $1,100 per month in income. She stated she has a wheelchair at  home. The patient acknowledged that she does not currently contribute to household bills, but plans to start providing her grandson with money to keep the electricity on.  From a psychiatric perspective, the patient denies depression, suicidal ideation, homicidal ideation, auditory or visual hallucinations, and she does not display delusions or psychosis during evaluation. She demonstrated a clear understanding of the risks and consequences of living in a home without electricity and is able to articulate her plan for managing this situation.  The patient is alert, oriented, and not cognitively impaired. She demonstrates the ability to understand, appreciate, and communicate her decisions regarding her care and living situation. There is no evidence of psychiatric illness impairing her judgment.  From a psychiatric standpoint, the patient does NOT lack capacity to make her own decisions and is psychiatrically cleared.  Psych ROS:  Depression: Denies Anxiety:   Denies Mania (lifetime and current):  Denies Psychosis: (lifetime and current):  Denies  Collateral information:  Attempted to call patient's daughter Verla Pray, no answer left a HIPAA compliant voicemail.  Review of Systems  Psychiatric/Behavioral: Negative.       Psychiatric and Social History  Psychiatric History:  Information collected from patient and chart review  Prev Dx/Sx: None Current Psych Provider: None Home Meds (current): None Previous Med Trials: Denies Therapy: Denies  Prior Psych Hospitalization: Denies Prior Self Harm: Denies Prior Violence: Denies  Family Psych History: Denies Family Hx suicide: Denies  Social History:  Developmental Hx: Deferred Educational Hx: Patient states he  graduated high school Occupational Hx: Retired Armed forces operational officer Hx: Denies Living Situation: Lives with grandson Spiritual Hx: Yes Access to weapons/lethal means: Denies   Substance History Patient denies any past or current  substance abuse history  Exam Findings  Physical Exam:  Vital Signs:  Temp:  [97.7 F (36.5 C)-98.4 F (36.9 C)] 97.7 F (36.5 C) (09/14 1112) Pulse Rate:  [59-80] 72 (09/14 1330) Resp:  [16-20] 16 (09/14 1330) BP: (109-144)/(50-86) 119/54 (09/14 1330) SpO2:  [96 %-100 %] 96 % (09/14 1330) Blood pressure (!) 119/54, pulse 72, temperature 97.7 F (36.5 C), temperature source Oral, resp. rate 16, SpO2 96%. There is no height or weight on file to calculate BMI.  Physical Exam Vitals and nursing note reviewed. Exam conducted with a chaperone present.  Neurological:     Mental Status: She is alert.  Psychiatric:        Mood and Affect: Mood normal.        Behavior: Behavior normal.        Thought Content: Thought content normal.        Judgment: Judgment normal.     Mental Status Exam: General Appearance: Casual  Orientation:  Full (Time, Place, and Person)  Memory:  Immediate;   Good Recent;   Good  Concentration:  Concentration: Good  Recall:  Good  Attention  Good  Eye Contact:  Good  Speech:  Clear and Coherent  Language:  Good  Volume:  Normal  Mood: I'm doing fine  Affect:  Congruent  Thought Process:  Coherent  Thought Content:  Logical  Suicidal Thoughts:  No  Homicidal Thoughts:  No  Judgement:  Intact  Insight:  Good  Psychomotor Activity:  Normal  Akathisia:  No  Fund of Knowledge:  Fair      Assets:  Manufacturing systems engineer Desire for Improvement Financial Resources/Insurance Housing Social Support  Cognition:  WNL  ADL's:  Intact  AIMS (if indicated):        Other History   These have been pulled in through the EMR, reviewed, and updated if appropriate.  Family History:  The patient's family history includes Hypertension in her mother.  Medical History: Past Medical History:  Diagnosis Date   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 (HCC) 09/16/2020   COPD (chronic obstructive pulmonary  disease) (HCC)    History of DVT (deep vein thrombosis)    Hypertension    Thoracoabdominal aortic aneurysm (HCC)    TIA (transient ischemic attack) 09/10/2020    Surgical History: Past Surgical History:  Procedure Laterality Date   IR BONE MARROW BIOPSY & ASPIRATION  12/13/2023   IR CT HEAD LTD  09/11/2020   IR INTRAVSC STENT CERV CAROTID W/O EMB-PROT MOD SED INC ANGIO  09/11/2020   IR PERCUTANEOUS ART THROMBECTOMY/INFUSION INTRACRANIAL INC DIAG ANGIO  09/11/2020       IR PERCUTANEOUS ART THROMBECTOMY/INFUSION INTRACRANIAL INC DIAG ANGIO  09/11/2020   IR US  GUIDE VASC ACCESS RIGHT  09/11/2020   NO PAST SURGERIES     RADIOLOGY WITH ANESTHESIA N/A 09/11/2020   Procedure: IR WITH ANESTHESIA;  Surgeon: Dolphus Carrion, MD;  Location: [facility] OR;  Service: Radiology;  Laterality: N/A;     Medications:   Current Facility-Administered Medications:    albuterol  (VENTOLIN  HFA) 108 (90 Base) MCG/ACT inhaler 2 puff, 2 puff, Inhalation, Q6H PRN, Patsey Lot, MD   amLODipine  (NORVASC ) tablet 10 mg, 10 mg, Oral, Daily, Patsey Lot, MD, 10 mg at 01/21/24 1156   furosemide  (LASIX )  tablet 40 mg, 40 mg, Oral, Daily, Patsey Lot, MD, 40 mg at 01/21/24 1157   hydrALAZINE  (APRESOLINE ) tablet 50 mg, 50 mg, Oral, Q8H, Patsey Lot, MD, 50 mg at 01/21/24 1356   hydrOXYzine  (ATARAX ) tablet 25 mg, 25 mg, Oral, TID PRN, Patsey Lot, MD   pantoprazole  (PROTONIX ) EC tablet 40 mg, 40 mg, Oral, Daily, Patsey Lot, MD, 40 mg at 01/21/24 1156   polyethylene glycol (MIRALAX  / GLYCOLAX ) packet 17 g, 17 g, Oral, Daily, Patsey Lot, MD   rosuvastatin  (CRESTOR ) tablet 20 mg, 20 mg, Oral, Daily, Patsey Lot, MD, 20 mg at 01/21/24 1157  Current Outpatient Medications:    albuterol  (VENTOLIN  HFA) 108 (90 Base) MCG/ACT inhaler, Inhale 2 puffs into the lungs every 6 (six) hours as needed for wheezing or shortness of breath., Disp: 8 g, Rfl: 2   amLODipine  (NORVASC ) 10 MG tablet, Take 1  tablet (10 mg total) by mouth daily., Disp: 30 tablet, Rfl: 0   furosemide  (LASIX ) 40 MG tablet, Take 1 tablet (40 mg total) by mouth daily., Disp: 30 tablet, Rfl: 3   hydrALAZINE  (APRESOLINE ) 50 MG tablet, Take 1 tablet (50 mg total) by mouth every 8 (eight) hours., Disp: 90 tablet, Rfl: 0   hydrOXYzine  (ATARAX ) 25 MG tablet, Take 1 tablet (25 mg total) by mouth 3 (three) times daily as needed for anxiety., Disp: 30 tablet, Rfl: 1   pantoprazole  (PROTONIX ) 40 MG tablet, Take 1 tablet (40 mg total) by mouth daily., Disp: 30 tablet, Rfl: 0   polyethylene glycol (MIRALAX  / GLYCOLAX ) 17 g packet, Take 17 g by mouth daily., Disp: 14 each, Rfl: 0   rosuvastatin  (CRESTOR ) 20 MG tablet, TAKE 1 TABLET(20 MG) BY MOUTH DAILY (Patient taking differently: Take 20 mg by mouth daily.), Disp: 90 tablet, Rfl: 0  Allergies: Allergies  Allergen Reactions   Iodinated Contrast Media Itching and Other (See Comments)    9/1 developed diffuse itching after contrast dye for CT (without rash or airway involvement or GI symptoms) . Consider pretreatment before IV contrast in the future     Yesha Muchow MOTLEY-MANGRUM, PMHNP

## 2024-01-21 NOTE — ED Provider Notes (Signed)
 Emergency Medicine Observation Re-evaluation Note  [name] is a 78 y.o. female, seen on rounds today.  Pt initially presented to the ED for complaints of Needs Social Services Currently, the patient is resting, denies complaints .  Physical Exam  BP (!) 109/50   Pulse 80   Temp 98.4 F (36.9 C) (Oral)   Resp 16   SpO2 100%  Physical Exam General: NAD Cardiac: regular rate Lungs: equal chest rise Psych: calm  ED Course / MDM  EKG:   I have reviewed the labs performed to date as well as medications administered while in observation.  Recent changes in the last 24 hours include arrived in er s/p hospital discharge .  Plan  Current plan is for sw eval.    Francesca Elsie CROME, MD 01/21/24 [phone]

## 2024-01-21 NOTE — ED Notes (Signed)
 Pts brief noted to be wet with urine. PT was cleansed, clean brief applied, and pt repositioned in bed.

## 2024-01-21 NOTE — ED Notes (Signed)
 Pt resting with no signs of distress. Respirations even and unlabored.

## 2024-01-21 NOTE — ED Notes (Signed)
PT eating at this time.

## 2024-01-21 NOTE — Progress Notes (Addendum)
 Pt's previous PT eval was review and pt would need to transport home via [facility] due to not being able to transfer without assistance/ambulate. Wheelchair transportation is not able to assist with that. [facility]CM leadership will need to speak with [facility] leadership on tomorrow. Charge RN notified via secure chat.

## 2024-01-21 NOTE — Progress Notes (Addendum)
 Welfare check called to confirm family is at the house. Awaiting response from [employer].  Addend @ 11:30AM Per non-emergency communication- pt's grandson is at home; however, daughter is not. Per chart review, pt did not ambulate with PT and will likely need support transferring to wc/bed once home unless transported by [facility]. [facility] previously refused due to no power.    CSW attempted to contact pt's daughter by phone. Phone going to voicemail. Left HIPAA Compliant voicemail requesting call back.   CSW informed ICM leadership- awaiting response.

## 2024-01-21 NOTE — Progress Notes (Addendum)
 CSW attempted to contact pt's son and daughter listed in chart. CSW left HIPAA Compliant voicemail for pt's son.   Per chart review, there is an active APS case. Pt previously refused SNF on inpatient admission after recommendation and opted to go home. There was question regarding pt's capacity; however, no provider has stated the patient lacks capacity to make decisions.  Addend @ 9:22AM CSW spoke with pt at bedside. Pt correctly answered orienting questions. Pt is adamant that she does not wish to return to SNF. Per pt, family was home at the time [facility] took her; however, [facility] did not leave her due to their being no electricity. She states her daughter will have it turned back on and reports she just started working. Pt verbalized she still wishes to go home although there is no electricity. CSW inquired about food and movement without electricity and pt stated she Eats takeout and stays in her room. CSW inquired about whether she has access to her funds. Pt reported she manages her own funds and allows access to her daughter and grandson when needed. 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 Pt stated she does have a wheelchair at home.   CSW will arrange Music therapist to return pt home. CSW will first have welfare check completed to confirm daughter is at the home since daughters phone is going to voicemail.  CSW notified EDP of the above information via secure chat.

## 2024-01-22 NOTE — ED Provider Notes (Addendum)
 Emergency Medicine Observation Re-evaluation Note  [name] is a 78 y.o. female, seen on rounds today.  Pt initially presented to the ED for complaints of Needs Social Services Currently, the patient is resting, denies complaints .  Physical Exam  BP (!) 116/55   Pulse (!) 58   Temp 97.6 F (36.4 C) (Oral)   Resp 16   SpO2 100%  Physical Exam General: NAD Cardiac: regular rate Lungs: equal chest rise Psych: calm  ED Course / MDM  EKG:   I have reviewed the labs performed to date as well as medications administered while in observation.  Recent changes in the last 24 hours include arrived in er s/p hospital discharge .  Plan  Current plan is for sw eval. patient going home today.  Remained stable for discharge.   Emil Share, DO 01/22/24 [phone]

## 2024-01-22 NOTE — ED Notes (Signed)
 Pt refused vitals

## 2024-01-22 NOTE — Progress Notes (Addendum)
 Awaiting response from [facility] leadership.  Addend @ 9:45AM ICM supervisor contacted Vina with [facility] and they will transport pt home.   CSW attempted to contact pt's daughter without success. CSW contacted non-emergency police to request a welfare check to inform family that pt will be discharging home today.  Addend @ 10:09AM CSW spoke with pt's grandson, Jaquil, who informed he is at home to receive the pt. He expressed concerns that [facility] would not leave pt before and CSW informed that our leadership team has spoken with them and they will transport her home. Jaquil verbalized understanding.

## 2024-01-29 NOTE — Progress Notes (Signed)
 No change

## 2024-03-22 ENCOUNTER — Encounter (HOSPITAL_COMMUNITY): Payer: Self-pay | Admitting: Emergency Medicine

## 2024-03-22 ENCOUNTER — Emergency Department (HOSPITAL_COMMUNITY)

## 2024-03-22 ENCOUNTER — Other Ambulatory Visit: Payer: Self-pay

## 2024-03-22 ENCOUNTER — Emergency Department (HOSPITAL_COMMUNITY)
Admission: EM | Admit: 2024-03-22 | Discharge: 2024-03-23 | Disposition: A | Discharge status: Home / Self Care | Attending: Emergency Medicine | Admitting: Emergency Medicine

## 2024-03-22 DIAGNOSIS — M25552 Pain in left hip: Secondary | ICD-10-CM | POA: Diagnosis present

## 2024-03-22 DIAGNOSIS — J449 Chronic obstructive pulmonary disease, unspecified: Secondary | ICD-10-CM | POA: Diagnosis not present

## 2024-03-22 DIAGNOSIS — I110 Hypertensive heart disease with heart failure: Secondary | ICD-10-CM | POA: Diagnosis not present

## 2024-03-22 DIAGNOSIS — I509 Heart failure, unspecified: Secondary | ICD-10-CM | POA: Diagnosis not present

## 2024-03-22 MED ORDER — ACETAMINOPHEN 325 MG PO TABS
650.0000 mg | ORAL_TABLET | Freq: Once | ORAL | Status: AC
  Administered 2024-03-22: 650 mg via ORAL
  Filled 2024-03-22: qty 2

## 2024-03-22 MED ORDER — LIDOCAINE 5 % EX PTCH
1.0000 | MEDICATED_PATCH | Freq: Once | CUTANEOUS | Status: DC
  Administered 2024-03-22: 1 via TRANSDERMAL
  Filled 2024-03-22: qty 1

## 2024-03-22 NOTE — Discharge Instructions (Signed)
 X-ray today without acute findings. Recommend tylenol  or motrin  for pain.  Alternating the two usually gives better pain control. Follow-up with your doctor. Return here for new concerns.

## 2024-03-22 NOTE — ED Triage Notes (Signed)
 Patient BIB EMS c/o left hip pain x2 days. Patient report taking Ibuprofen  with no relief. Patient denies fall or injury. Hx CVA, left sided deficit.

## 2024-03-22 NOTE — ED Provider Notes (Signed)
 [place] [facility]PARTMENT AT [facility] Provider Note   CSN: [id number] Arrival date & time: 03/22/24  2048     Patient presents with: Hip Pain   [name] is a 78 y.o. female.   The history is provided by the patient and medical records.  Hip Pain   78 y.o. F with hx of COPD, HTN, CHF, presenting to the ED with left hip pain.  Patient reports has been ongoing x2 days.  Denies injury, trauma, or fall.  States pain along lateral edge of hip.  Took motrin  yesterday without relief, did not take anything today.  Prior to Admission medications   Medication Sig Start Date End Date Taking? Authorizing Provider  albuterol  (VENTOLIN  HFA) 108 (90 Base) MCG/ACT inhaler Inhale 2 puffs into the lungs every 6 (six) hours as needed for wheezing or shortness of breath. 01/09/24   Darci Pore, MD  amLODipine  (NORVASC ) 10 MG tablet Take 1 tablet (10 mg total) by mouth daily. 12/15/23   Rojelio Nest, DO  furosemide  (LASIX ) 40 MG tablet Take 1 tablet (40 mg total) by mouth daily. 01/19/24   Patsy Lenis, MD  hydrALAZINE  (APRESOLINE ) 50 MG tablet Take 1 tablet (50 mg total) by mouth every 8 (eight) hours. 12/15/23   Rojelio Nest, DO  hydrOXYzine  (ATARAX ) 25 MG tablet Take 1 tablet (25 mg total) by mouth 3 (three) times daily as needed for anxiety. 01/19/24   Patsy Lenis, MD  pantoprazole  (PROTONIX ) 40 MG tablet Take 1 tablet (40 mg total) by mouth daily. 12/15/23   Rojelio Nest, DO  polyethylene glycol (MIRALAX  / GLYCOLAX ) 17 g packet Take 17 g by mouth daily. 12/15/23   Rojelio Nest, DO  rosuvastatin  (CRESTOR ) 20 MG tablet TAKE 1 TABLET(20 MG) BY MOUTH DAILY Patient taking differently: Take 20 mg by mouth daily. 08/23/23   Jaycee Greig PARAS, NP    Allergies: Iodinated contrast media    Review of Systems  Musculoskeletal:  Positive for arthralgias.  All other systems reviewed and are negative.   Updated Vital Signs BP (!) 100/53 (BP Location: Left Arm)   Pulse 75    Temp 98.6 F (37 C) (Oral)   Resp 18   SpO2 100%   Physical Exam Vitals and nursing note reviewed.  Constitutional:      Appearance: She is well-developed.  HENT:     Head: Normocephalic and atraumatic.  Eyes:     Conjunctiva/sclera: Conjunctivae normal.     Pupils: Pupils are equal, round, and reactive to light.  Cardiovascular:     Rate and Rhythm: Normal rate and regular rhythm.     Heart sounds: Normal heart sounds.  Pulmonary:     Effort: Pulmonary effort is normal.     Breath sounds: Normal breath sounds.  Musculoskeletal:        General: Normal range of motion.     Cervical back: Normal range of motion.     Comments: Very mild tenderness along lateral hip without bony deformity, no open wounds/sores, no leg shortening or malrotation  Skin:    General: Skin is warm and dry.  Neurological:     Mental Status: She is alert and oriented to person, place, and time.     (all labs ordered are listed, but only abnormal results are displayed) Labs Reviewed - No data to display  EKG: None  Radiology: No results found.   Procedures   Medications Ordered in the ED  acetaminophen  (TYLENOL ) tablet 650 mg (650 mg Oral Given  03/22/24 2238)                                    Medical Decision Making Amount and/or Complexity of Data Reviewed Radiology: ordered and independent interpretation performed.  Risk OTC drugs.   78 y.o. F here with atraumatic left hip pain.  Took Motrin  yesterday but did not try any meds today.  She has some mild tenderness along the lateral left hip but there is no acute deformity, wound or sore.  She has no leg length discrepancy or malrotation.  X-ray is negative for any acute findings.  Feel she is stable for discharge home with symptomatic care.  Can return here for new concerns.  Final diagnoses:  Left hip pain    ED Discharge Orders     None          Jarold Olam HERO, PA-C 03/22/24 2304    Rogelia Jerilynn RAMAN, MD 03/22/24  [phone]

## 2024-04-13 ENCOUNTER — Emergency Department (HOSPITAL_COMMUNITY)

## 2024-04-13 ENCOUNTER — Inpatient Hospital Stay (HOSPITAL_COMMUNITY)
Admission: EM | Admit: 2024-04-13 | Source: Home / Self Care | Attending: Internal Medicine | Admitting: Internal Medicine

## 2024-04-13 ENCOUNTER — Other Ambulatory Visit: Payer: Self-pay

## 2024-04-13 DIAGNOSIS — K7200 Acute and subacute hepatic failure without coma: Secondary | ICD-10-CM | POA: Diagnosis present

## 2024-04-13 DIAGNOSIS — N179 Acute kidney failure, unspecified: Secondary | ICD-10-CM

## 2024-04-13 DIAGNOSIS — R410 Disorientation, unspecified: Principal | ICD-10-CM

## 2024-04-13 DIAGNOSIS — I330 Acute and subacute infective endocarditis: Secondary | ICD-10-CM

## 2024-04-13 DIAGNOSIS — R7401 Elevation of levels of liver transaminase levels: Secondary | ICD-10-CM

## 2024-04-13 DIAGNOSIS — N3000 Acute cystitis without hematuria: Secondary | ICD-10-CM

## 2024-04-13 DIAGNOSIS — E876 Hypokalemia: Secondary | ICD-10-CM

## 2024-04-13 DIAGNOSIS — A419 Sepsis, unspecified organism: Secondary | ICD-10-CM

## 2024-04-13 LAB — I-STAT CHEM 8, ED
BUN: 45 mg/dL — ABNORMAL HIGH (ref 8–23)
Calcium, Ion: 1.02 mmol/L — ABNORMAL LOW (ref 1.15–1.40)
Chloride: 106 mmol/L (ref 98–111)
Creatinine, Ser: 2.2 mg/dL — ABNORMAL HIGH (ref 0.44–1.00)
Glucose, Bld: 120 mg/dL — ABNORMAL HIGH (ref 70–99)
HCT: 39 % (ref 36.0–46.0)
Hemoglobin: 13.3 g/dL (ref 12.0–15.0)
Potassium: 2.5 mmol/L — CL (ref 3.5–5.1)
Sodium: 147 mmol/L — ABNORMAL HIGH (ref 135–145)
TCO2: 20 mmol/L — ABNORMAL LOW (ref 22–32)

## 2024-04-13 LAB — I-STAT VENOUS BLOOD GAS, ED
Acid-base deficit: 4 mmol/L — ABNORMAL HIGH (ref 0.0–2.0)
Bicarbonate: 21.5 mmol/L (ref 20.0–28.0)
Calcium, Ion: 1.01 mmol/L — ABNORMAL LOW (ref 1.15–1.40)
HCT: 37 % (ref 36.0–46.0)
Hemoglobin: 12.6 g/dL (ref 12.0–15.0)
O2 Saturation: 56 %
Potassium: 2.5 mmol/L — CL (ref 3.5–5.1)
Sodium: 146 mmol/L — ABNORMAL HIGH (ref 135–145)
TCO2: 23 mmol/L (ref 22–32)
pCO2, Ven: 40.2 mmHg — ABNORMAL LOW (ref 44–60)
pH, Ven: 7.336 (ref 7.25–7.43)
pO2, Ven: 31 mmHg — CL (ref 32–45)

## 2024-04-13 LAB — CBC WITH DIFFERENTIAL/PLATELET
Abs Immature Granulocytes: 0.04 K/uL (ref 0.00–0.07)
Basophils Absolute: 0 K/uL (ref 0.0–0.1)
Basophils Relative: 0 %
Eosinophils Absolute: 0 K/uL (ref 0.0–0.5)
Eosinophils Relative: 0 %
HCT: 36.4 % (ref 36.0–46.0)
Hemoglobin: 11.9 g/dL — ABNORMAL LOW (ref 12.0–15.0)
Immature Granulocytes: 1 %
Lymphocytes Relative: 7 %
Lymphs Abs: 0.5 K/uL — ABNORMAL LOW (ref 0.7–4.0)
MCH: 29.6 pg (ref 26.0–34.0)
MCHC: 32.7 g/dL (ref 30.0–36.0)
MCV: 90.5 fL (ref 80.0–100.0)
Monocytes Absolute: 0.1 K/uL (ref 0.1–1.0)
Monocytes Relative: 1 %
Neutro Abs: 6.2 K/uL (ref 1.7–7.7)
Neutrophils Relative %: 91 %
Platelets: 56 K/uL — ABNORMAL LOW (ref 150–400)
RBC: 4.02 MIL/uL (ref 3.87–5.11)
RDW: 14.6 % (ref 11.5–15.5)
WBC: 6.8 K/uL (ref 4.0–10.5)
nRBC: 1.6 % — ABNORMAL HIGH (ref 0.0–0.2)

## 2024-04-13 LAB — URINALYSIS, W/ REFLEX TO CULTURE (INFECTION SUSPECTED)
Glucose, UA: NEGATIVE mg/dL
Ketones, ur: NEGATIVE mg/dL
Leukocytes,Ua: NEGATIVE
Nitrite: NEGATIVE
Protein, ur: 100 mg/dL — AB
Specific Gravity, Urine: 1.025 (ref 1.005–1.030)
pH: 5 (ref 5.0–8.0)

## 2024-04-13 LAB — COMPREHENSIVE METABOLIC PANEL WITH GFR
ALT: 2226 U/L — ABNORMAL HIGH (ref 0–44)
AST: 4750 U/L — ABNORMAL HIGH (ref 15–41)
Albumin: 2.7 g/dL — ABNORMAL LOW (ref 3.5–5.0)
Alkaline Phosphatase: 102 U/L (ref 38–126)
Anion gap: 19 — ABNORMAL HIGH (ref 5–15)
BUN: 46 mg/dL — ABNORMAL HIGH (ref 8–23)
CO2: 20 mmol/L — ABNORMAL LOW (ref 22–32)
Calcium: 8.6 mg/dL — ABNORMAL LOW (ref 8.9–10.3)
Chloride: 106 mmol/L (ref 98–111)
Creatinine, Ser: 2.38 mg/dL — ABNORMAL HIGH (ref 0.44–1.00)
GFR, Estimated: 20 mL/min — ABNORMAL LOW (ref >60)
Glucose, Bld: 122 mg/dL — ABNORMAL HIGH (ref 70–99)
Potassium: 2.6 mmol/L — CL (ref 3.5–5.1)
Sodium: 145 mmol/L (ref 135–145)
Total Bilirubin: 2.6 mg/dL — ABNORMAL HIGH (ref 0.0–1.2)
Total Protein: 7 g/dL (ref 6.5–8.1)

## 2024-04-13 LAB — I-STAT CG4 LACTIC ACID, ED
Lactic Acid, Venous: 8.8 mmol/L (ref 0.5–1.9)
Lactic Acid, Venous: 7.1 mmol/L (ref 0.5–1.9)

## 2024-04-13 LAB — RESP PANEL BY RT-PCR (RSV, FLU A&B, COVID)  RVPGX2
Influenza A by PCR: NEGATIVE
Influenza B by PCR: NEGATIVE
Resp Syncytial Virus by PCR: NEGATIVE
SARS Coronavirus 2 by RT PCR: NEGATIVE

## 2024-04-13 LAB — CBG MONITORING, ED: Glucose-Capillary: 154 mg/dL — ABNORMAL HIGH (ref 70–99)

## 2024-04-13 LAB — PROTIME-INR
INR: 2.3 — ABNORMAL HIGH (ref 0.8–1.2)
Prothrombin Time: 26.5 s — ABNORMAL HIGH (ref 11.4–15.2)

## 2024-04-13 LAB — TROPONIN I (HIGH SENSITIVITY): Troponin I (High Sensitivity): 316 ng/L (ref <18)

## 2024-04-13 LAB — LIPASE, BLOOD: Lipase: 106 U/L — ABNORMAL HIGH (ref 11–51)

## 2024-04-13 LAB — BRAIN NATRIURETIC PEPTIDE: B Natriuretic Peptide: 226 pg/mL — ABNORMAL HIGH (ref 0.0–100.0)

## 2024-04-13 MED ORDER — VANCOMYCIN HCL 1750 MG/350ML IV SOLN
1750.0000 mg | Freq: Once | INTRAVENOUS | Status: AC
  Administered 2024-04-13: 1750 mg via INTRAVENOUS
  Filled 2024-04-13: qty 350

## 2024-04-13 MED ORDER — POLYETHYLENE GLYCOL 3350 17 G PO PACK: 17.0000 g | PACK | Freq: Every day | ORAL | Status: DC | PRN

## 2024-04-13 MED ORDER — LACTATED RINGERS IV SOLN: INTRAVENOUS | Status: DC

## 2024-04-13 MED ORDER — ONDANSETRON HCL 4 MG/2ML IJ SOLN
4.0000 mg | Freq: Once | INTRAMUSCULAR | Status: AC
  Administered 2024-04-14: 4 mg via INTRAVENOUS
  Filled 2024-04-13: qty 2

## 2024-04-13 MED ORDER — SODIUM CHLORIDE 0.9 % IV BOLUS (SEPSIS)
1000.0000 mL | Freq: Once | INTRAVENOUS | Status: AC
  Administered 2024-04-13: 1000 mL via INTRAVENOUS

## 2024-04-13 MED ORDER — PANTOPRAZOLE SODIUM 40 MG IV SOLR
40.0000 mg | Freq: Once | INTRAVENOUS | Status: AC
  Administered 2024-04-13: 40 mg via INTRAVENOUS
  Filled 2024-04-13: qty 10

## 2024-04-13 MED ORDER — CHLORHEXIDINE GLUCONATE CLOTH 2 % EX PADS
6.0000 | MEDICATED_PAD | Freq: Every day | CUTANEOUS | Status: DC
  Administered 2024-04-14 – 2024-04-17 (×4): 6 via TOPICAL

## 2024-04-13 MED ORDER — SODIUM CHLORIDE 0.9 % IV SOLN
2.0000 g | Freq: Once | INTRAVENOUS | Status: AC
  Administered 2024-04-13: 2 g via INTRAVENOUS
  Filled 2024-04-13: qty 12.5

## 2024-04-13 MED ORDER — POTASSIUM CHLORIDE 10 MEQ/100ML IV SOLN
10.0000 meq | INTRAVENOUS | Status: AC
  Administered 2024-04-13 – 2024-04-14 (×4): 10 meq via INTRAVENOUS
  Filled 2024-04-13 (×4): qty 100

## 2024-04-13 MED ORDER — METRONIDAZOLE 500 MG/100ML IV SOLN
500.0000 mg | Freq: Once | INTRAVENOUS | Status: AC
  Administered 2024-04-13: 500 mg via INTRAVENOUS
  Filled 2024-04-13: qty 100

## 2024-04-13 MED ORDER — ASPIRIN 300 MG RE SUPP
300.0000 mg | Freq: Once | RECTAL | Status: AC
  Administered 2024-04-13: 300 mg via RECTAL
  Filled 2024-04-13: qty 1

## 2024-04-13 MED ORDER — DOCUSATE SODIUM 100 MG PO CAPS: 100.0000 mg | ORAL_CAPSULE | Freq: Two times a day (BID) | ORAL | Status: DC | PRN

## 2024-04-13 MED ORDER — VANCOMYCIN HCL IN DEXTROSE 1-5 GM/200ML-% IV SOLN: 1000.0000 mg | Freq: Once | INTRAVENOUS | Status: DC

## 2024-04-13 NOTE — ED Provider Notes (Signed)
 [facility] AT [facility] Provider Note   CSN: [id number] Arrival date & time: 04/13/24  2019     Patient presents with: Altered Mental Status   [name] is a 78 y.o. female. Hx of COPD, HTN, CHF, prior CVA with left-sided residual deficits presenting with reported chest pain per EMS, significant delirium, altered mentation, hypothermia, hypotension, and hypoglycemia.  History per EMS, patient unable to provide any history secondary to significant delirium.  Per prior chart evaluation, patient is normally alert and oriented, and able to answer questions.  Normally is able to walk.  Per report, call out was for chest pain, area is a house that nobody currently lives in.  It was reportedly a call out by the roommate, who then left prior to EMS arrival.  Patient appreciated to be laying on the bed, unresponsive, underneath a single blanket without heat or lights on.  Was very cool to palpation, unable to appreciate radial pulses but able to appreciate femoral pulse.  Hypotensive, systolics in the 60s reportedly, started on fluids.  Patient unable to answer any questions, just makes verbal noises that are difficult to understand.  Hypothermic, unable to receive temperature.  Hypoglycemic, BG 50, started on D10 infusion with EMS.  Covered in feces, dried blood present on the mouth and nose, no active vomiting.  {Add pertinent medical, surgical, social history, OB history to YEP:67052}  Altered Mental Status      Prior to Admission medications   Medication Sig Start Date End Date Taking? Authorizing Provider  albuterol  (VENTOLIN  HFA) 108 (90 Base) MCG/ACT inhaler Inhale 2 puffs into the lungs every 6 (six) hours as needed for wheezing or shortness of breath. 01/09/24   Darci Pore, MD  amLODipine  (NORVASC ) 10 MG tablet Take 1 tablet (10 mg total) by mouth daily. 12/15/23   Rojelio Nest, DO  furosemide  (LASIX ) 40 MG tablet Take 1 tablet (40 mg total) by  mouth daily. 01/19/24   Patsy Lenis, MD  hydrALAZINE  (APRESOLINE ) 50 MG tablet Take 1 tablet (50 mg total) by mouth every 8 (eight) hours. 12/15/23   Rojelio Nest, DO  hydrOXYzine  (ATARAX ) 25 MG tablet Take 1 tablet (25 mg total) by mouth 3 (three) times daily as needed for anxiety. 01/19/24   Patsy Lenis, MD  pantoprazole  (PROTONIX ) 40 MG tablet Take 1 tablet (40 mg total) by mouth daily. 12/15/23   Rojelio Nest, DO  polyethylene glycol (MIRALAX  / GLYCOLAX ) 17 g packet Take 17 g by mouth daily. 12/15/23   Rojelio Nest, DO  rosuvastatin  (CRESTOR ) 20 MG tablet TAKE 1 TABLET(20 MG) BY MOUTH DAILY Patient taking differently: Take 20 mg by mouth daily. 08/23/23   Jaycee Greig PARAS, NP    Allergies: Iodinated contrast media    Review of Systems  Updated Vital Signs BP (!) 116/50   Pulse 60   Temp (!) 94.8 F (34.9 C)   Resp 20   Ht 5' 10 (1.778 m)   Wt 85.1 kg   SpO2 100%   BMI 26.92 kg/m   Physical Exam Vitals and nursing note reviewed.  Constitutional:      General: She is in acute distress.     Appearance: She is ill-appearing and toxic-appearing.     Comments: GCS of 10, keeps eyes open, incomprehensible sounds, withdraws from pain.  Cannot provide any answers to questioning.  No external signs of trauma.  HENT:     Head: Normocephalic and atraumatic.     Mouth/Throat:  Mouth: Mucous membranes are dry.     Pharynx: Oropharynx is clear.     Comments: Very dry appearing Eyes:     Extraocular Movements: Extraocular movements intact.     Pupils: Pupils are equal, round, and reactive to light.     Comments: Eyes were able to cross midline.  Cardiovascular:     Rate and Rhythm: Normal rate and regular rhythm.     Pulses: Normal pulses.     Heart sounds: Normal heart sounds. No murmur heard.    No gallop.  Pulmonary:     Effort: Pulmonary effort is normal. No respiratory distress.     Breath sounds: Rhonchi and rales present.     Comments: Rales and rhonchi present  throughout all lung fields Abdominal:     General: Abdomen is flat. There is no distension.     Tenderness: There is abdominal tenderness. There is guarding. There is no right CVA tenderness or left CVA tenderness.     Comments: Generalized TTP, primarily in the epigastric region  Musculoskeletal:     Cervical back: Neck supple. No rigidity.  Skin:    Capillary Refill: Capillary refill takes more than 3 seconds.     Comments: Cool to palpation throughout.  Neurological:     Comments: GCS of 10, actively moving all extremities, incomprehensible sounds, withdraws from pain, spontaneously keeps eyes open.  Cannot participate in sensory and coordination exam. Able to move all extremities, significantly reduced movement of the left upper and lower extremity in comparison to the right side.     (all labs ordered are listed, but only abnormal results are displayed) Labs Reviewed  CBG MONITORING, ED - Abnormal; Notable for the following components:      Result Value   Glucose-Capillary 154 (*)    All other components within normal limits    EKG: None  Radiology: No results found.  {Document cardiac monitor, telemetry assessment procedure when appropriate:32947} Procedures   Medications Ordered in the ED - No data to display    {Click here for ABCD2, HEART and other calculators REFRESH Note before signing:1}                              Medical Decision Making Amount and/or Complexity of Data Reviewed Labs: ordered. Radiology: ordered.  Risk Prescription drug management.   ***  {Document critical care time when appropriate  Document review of labs and clinical decision tools ie CHADS2VASC2, etc  Document your independent review of radiology images and any outside records  Document your discussion with family members, caretakers and with consultants  Document social determinants of health affecting pt's care  Document your decision making why or why not admission,  treatments were needed:32947:::1}   Final diagnoses:  None    ED Discharge Orders     None

## 2024-04-13 NOTE — H&P (Incomplete)
 NAME:  [name], MRN:  [id number], DOB:  1945/10/28, LOS: 0 ADMISSION DATE:  04/13/2024, CHIEF COMPLAINT:  Altered Mental Status.   History of Present Illness:  This 78 year old woman presented to the ER after EMS was called by her roommate as she was not responsive.  She has a history of CVA, heart failure with preserved ejection fraction, DVT not on anticoagulation, hypertension, falls in the past, E. coli UTI.  Patient could not give me any history.  I called the son who apparently has not been in touch with his mother recently.  He recommended me to call the patient's daughter which I did and she could not pick up the phone but son said she is planning on coming to the hospital tomorrow.  Patient apparently complained of chest pain and was very confused.  The roommate of this patient called EMS.  When EMS arrived the roommate was not there.  Apparently nobody was there at home.  Patient was laying in bed not much responsive.  She was underneath a single blanket.  Home is without heat or lights on.  She was very cool to palpitation.  She was initially hypotensive and hypoglycemic.  As she was being rewarmed with the Bair hugger IV fluids and dextrose  were given her blood pressure was significantly improved.  Patient was apparently covered in feces.  She is found to be profoundly dehydrated with dry skin turgor.  The emergency room patient was found to be in acute liver failure with coagulopathy.  Her UA was positive.  Patient is given IV fluids dextrose  and is rewarmed.  She is alert with GCS of 10-12.  She is maintaining her airway.  She is hemodynamically stable not needing any vasopressor therapy.  She is being rewarmed.  BNP 326 Bedside POCUS is attempted EF seems low, no pericardial effusion however patient was not much cooperative given that she was needing rewarming.  Will get a formal echo in the morning.  Pertinent  Medical History   Past Medical History:  Diagnosis Date   Acute  ischemic cerebrovascular accident (CVA) involving middle cerebral artery territory (HCC) 09/16/2020   COPD (chronic obstructive pulmonary disease) (HCC)    History of DVT (deep vein thrombosis)    Hypertension    Thoracoabdominal aortic aneurysm    TIA (transient ischemic attack) 09/10/2020     Significant Hospital Events: Including procedures, antibiotic start and stop dates in addition to other pertinent events   04/13/2024 -patient presented to [facility], ER with confusion found to have elevated LFTs.  Interim History / Subjective:  Patient is admitted to ICU for close airway watch and hemodynamic monitoring.  Objective    Blood pressure (!) 163/83, pulse 68, temperature (!) 94.8 F (34.9 C), resp. rate 13, height 5' 10 (1.778 m), weight 85.1 kg, SpO2 100%.        Intake/Output Summary (Last 24 hours) at 04/13/2024 2345 Last data filed at 04/13/2024 2310 Gross per 24 hour  Intake 1200 ml  Output --  Net 1200 ml   Filed Weights   04/13/24 2023  Weight: 85.1 kg    Examination: Physical exam: General: Chronically ill-appearing female, lying on the bed, alert but not oriented. GCS  12. HEENT: [place]/AT, eyes anicteric.  moist mucus membranes Neuro: Alert, awake following commands Chest: Coarse breath sounds, no wheezes or rhonchi Heart: Regular rate and rhythm, no murmurs or gallops Abdomen: Soft, nontender, nondistended, bowel sounds present   Resolved problem list   Assessment and Plan  Septic Shock  UTI - Patient is given empiric cefepime  vancomycin  and Flagyl  in the ER - She has history of E. coli UTI we will continue IV Zosyn  for now - Reevaluate further antibiotics in the morning - Follow urine and blood cultures - Follow lactic acid -This was 3 L of crystalloid in the ER continue LR at 150 an hour - Continue hemodynamic monitoring goal MAP above 65 not needing any vasopressor therapy currently.   Acute Liver Failure Altered Mental Status - GCS  12 Hypothermia Hypoglycemia Coagulopathy  - Bair hugger to continue- aim normothermia - CT head did not show anything acute - The CT abdomen pelvis did not show anything acute - Ultrasound abdomen shows hepatic steatosis - UDS pending, Tylenol  levels and aspirin  levels are pending - Hepatitis panel will be checked. - Check thyroid  function test - Check Ammonia levels - Check coags including PT/INR APTT and fibrinogen  - 1 dose of vitamin K  - If there is active bleeding will order FFP for now monitor - GI consult in the morning - Every 2 hour POC blood sugar checks - Maintaining airway right now monitor airway closely in the ICU - NPO For tonight and SLP evaluation in the am.  Acute Kidney Injury -likely prerenal from sepsis and dehydration Hypokalemia Severe Lactic Acidosis Severe dehydration  - Monitor lactic acid - Renal functions, electrolytes and urine output   Chest pain Elevated troponin Likely type 2 MI History of heart failure with preserved ejection fraction H/o Hypertension Stable infrarenal abdominal aortic aneurysm 4.2 cm  - Echocardiogram - Trend troponin - Telemetry - Patient got aspirin  in the ER. - With the coagulopathy hold off on heparin  as elevated troponin could be type II MI - Hold home dose lasix , amlodipine , hydralazine  - Need follow-up CT for AAA in 1 year.  H/o CVA, SAH, COPD, DVT  Called daughter no reply Called son - Does not know much details about the patient. Recommends waiting for the sister to visit the patient in the am  Full code and full cares for now If lactic acid clears and the patient remains hemodynamically remained stable and airways patent could be transferred out of ICU tomorrow.  Labs   CBC: Recent Labs  Lab 04/13/24 2123 04/13/24 2132  WBC 6.8  --   NEUTROABS 6.2  --   HGB 11.9* 12.6  13.3  HCT 36.4 37.0  39.0  MCV 90.5  --   PLT 56*  --     Basic Metabolic Panel: Recent Labs  Lab 04/13/24 2123  04/13/24 2132  NA 145 146*  147*  K 2.6* 2.5*  2.5*  CL 106 106  CO2 20*  --   GLUCOSE 122* 120*  BUN 46* 45*  CREATININE 2.38* 2.20*  CALCIUM  8.6*  --    GFR: Estimated Creatinine Clearance: 24.2 mL/min (A) (by C-G formula based on SCr of 2.2 mg/dL (H)). Recent Labs  Lab 04/13/24 2123 04/13/24 2132  WBC 6.8  --   LATICACIDVEN  --  8.8*    Liver Function Tests: Recent Labs  Lab 04/13/24 2123  AST 4,750*  ALT 2,226*  ALKPHOS 102  BILITOT 2.6*  PROT 7.0  ALBUMIN  2.7*   Recent Labs  Lab 04/13/24 2123  LIPASE 106*   No results for input(s): AMMONIA in the last 168 hours.  ABG    Component Value Date/Time   HCO3 21.5 04/13/2024 2132   TCO2 20 (L) 04/13/2024 2132   TCO2 23 04/13/2024 2132  ACIDBASEDEF 4.0 (H) 04/13/2024 2132   O2SAT 56 04/13/2024 2132     Coagulation Profile: Recent Labs  Lab 04/13/24 2123  INR 2.3*    Cardiac Enzymes: No results for input(s): CKTOTAL, CKMB, CKMBINDEX, TROPONINI in the last 168 hours.  HbA1C: Hgb A1c MFr Bld  Date/Time Value Ref Range Status  09/11/2020 05:03 PM 5.3 4.8 - 5.6 % Final    Comment:    (NOTE) Pre diabetes:          5.7%-6.4%  Diabetes:              >6.4%  Glycemic control for   <7.0% adults with diabetes     CBG: Recent Labs  Lab 04/13/24 2022  GLUCAP 154*    Review of Systems:   Review of systems could not be done as the patient is confused.  Past Medical History:  She,  has a past medical history of 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 [facility]) (09/16/2020), COPD (chronic obstructive pulmonary disease) (HCC), History of DVT (deep vein thrombosis), Hypertension, Thoracoabdominal aortic aneurysm, and TIA (transient ischemic attack) (09/10/2020).   Surgical History:   Past Surgical History:  Procedure Laterality Date   IR BONE MARROW BIOPSY & ASPIRATION  12/13/2023   IR CT HEAD LTD  09/11/2020   IR INTRAVSC STENT CERV CAROTID  W/O EMB-PROT MOD SED INC ANGIO  09/11/2020   IR PERCUTANEOUS ART THROMBECTOMY/INFUSION INTRACRANIAL INC DIAG ANGIO  09/11/2020       IR PERCUTANEOUS ART THROMBECTOMY/INFUSION INTRACRANIAL INC DIAG ANGIO  09/11/2020   IR US  GUIDE VASC ACCESS RIGHT  09/11/2020   NO PAST SURGERIES     RADIOLOGY WITH ANESTHESIA N/A 09/11/2020   Procedure: IR WITH ANESTHESIA;  Surgeon: Dolphus Carrion, MD;  Location: [facility] OR;  Service: Radiology;  Laterality: N/A;     Social History:   reports that she quit smoking about 36 years ago. Her smoking use included cigarettes. She has never used smokeless tobacco. She reports that she does not currently use alcohol. She reports that she does not use drugs.   Family History:  Her family history includes Hypertension in her mother.   Allergies Allergies  Allergen Reactions   Iodinated Contrast Media Itching and Other (See Comments)    9/1 developed diffuse itching after contrast dye for CT (without rash or airway involvement or GI symptoms) . Consider pretreatment before IV contrast in the future      Home Medications  Prior to Admission medications   Medication Sig Start Date End Date Taking? Authorizing Provider  albuterol  (VENTOLIN  HFA) 108 (90 Base) MCG/ACT inhaler Inhale 2 puffs into the lungs every 6 (six) hours as needed for wheezing or shortness of breath. 01/09/24   Darci Pore, MD  amLODipine  (NORVASC ) 10 MG tablet Take 1 tablet (10 mg total) by mouth daily. 12/15/23   Rojelio Nest, DO  furosemide  (LASIX ) 40 MG tablet Take 1 tablet (40 mg total) by mouth daily. 01/19/24   Patsy Lenis, MD  hydrALAZINE  (APRESOLINE ) 50 MG tablet Take 1 tablet (50 mg total) by mouth every 8 (eight) hours. 12/15/23   Rojelio Nest, DO  hydrOXYzine  (ATARAX ) 25 MG tablet Take 1 tablet (25 mg total) by mouth 3 (three) times daily as needed for anxiety. 01/19/24   Patsy Lenis, MD  pantoprazole  (PROTONIX ) 40 MG tablet Take 1 tablet (40 mg total) by mouth daily. 12/15/23   Rojelio Nest, DO  polyethylene glycol (MIRALAX  / GLYCOLAX ) 17 g packet Take 17  g by mouth daily. 12/15/23   Rojelio Nest, DO  rosuvastatin  (CRESTOR ) 20 MG tablet TAKE 1 TABLET(20 MG) BY MOUTH DAILY Patient taking differently: Take 20 mg by mouth daily. 08/23/23   Jaycee Greig PARAS, NP     Critical care time: 40 Minutes.    Tamela Stakes, MD  Attending Physician, Critical Care Medicine [employer] [facility] Pulmonary Critical Care See Amion for pager If no response to pager, please call [phone] until 7pm After 7pm, Please call E-link [phone]

## 2024-04-13 NOTE — Sepsis Progress Note (Signed)
 Elink following code sepsis

## 2024-04-13 NOTE — ED Notes (Signed)
 Lab results was reported to Dr. Ginger.

## 2024-04-14 ENCOUNTER — Inpatient Hospital Stay (HOSPITAL_COMMUNITY)

## 2024-04-14 LAB — CBC
HCT: 28.8 % — ABNORMAL LOW (ref 36.0–46.0)
Hemoglobin: 9.5 g/dL — ABNORMAL LOW (ref 12.0–15.0)
MCH: 30 pg (ref 26.0–34.0)
MCHC: 33 g/dL (ref 30.0–36.0)
MCV: 90.9 fL (ref 80.0–100.0)
Platelets: 52 K/uL — ABNORMAL LOW (ref 150–400)
RBC: 3.17 MIL/uL — ABNORMAL LOW (ref 3.87–5.11)
RDW: 15.4 % (ref 11.5–15.5)
WBC: 5.1 K/uL (ref 4.0–10.5)
nRBC: 1.4 % — ABNORMAL HIGH (ref 0.0–0.2)

## 2024-04-14 LAB — HEPATIC FUNCTION PANEL
ALT: 1437 U/L — ABNORMAL HIGH (ref 0–44)
ALT: 1236 U/L — ABNORMAL HIGH (ref 0–44)
AST: 2619 U/L — ABNORMAL HIGH (ref 15–41)
AST: 1795 U/L — ABNORMAL HIGH (ref 15–41)
Albumin: 1.9 g/dL — ABNORMAL LOW (ref 3.5–5.0)
Albumin: 1.9 g/dL — ABNORMAL LOW (ref 3.5–5.0)
Alkaline Phosphatase: 82 U/L (ref 38–126)
Alkaline Phosphatase: 82 U/L (ref 38–126)
Bilirubin, Direct: 1 mg/dL — ABNORMAL HIGH (ref 0.0–0.2)
Bilirubin, Direct: 1 mg/dL — ABNORMAL HIGH (ref 0.0–0.2)
Indirect Bilirubin: 1.2 mg/dL — ABNORMAL HIGH (ref 0.3–0.9)
Indirect Bilirubin: 1 mg/dL — ABNORMAL HIGH (ref 0.3–0.9)
Total Bilirubin: 2.2 mg/dL — ABNORMAL HIGH (ref 0.0–1.2)
Total Bilirubin: 2 mg/dL — ABNORMAL HIGH (ref 0.0–1.2)
Total Protein: 5.4 g/dL — ABNORMAL LOW (ref 6.5–8.1)
Total Protein: 5.3 g/dL — ABNORMAL LOW (ref 6.5–8.1)

## 2024-04-14 LAB — BASIC METABOLIC PANEL WITH GFR
Anion gap: 18 — ABNORMAL HIGH (ref 5–15)
Anion gap: 13 (ref 5–15)
BUN: 42 mg/dL — ABNORMAL HIGH (ref 8–23)
BUN: 40 mg/dL — ABNORMAL HIGH (ref 8–23)
CO2: 18 mmol/L — ABNORMAL LOW (ref 22–32)
CO2: 20 mmol/L — ABNORMAL LOW (ref 22–32)
Calcium: 7.4 mg/dL — ABNORMAL LOW (ref 8.9–10.3)
Calcium: 7.4 mg/dL — ABNORMAL LOW (ref 8.9–10.3)
Chloride: 110 mmol/L (ref 98–111)
Chloride: 110 mmol/L (ref 98–111)
Creatinine, Ser: 1.59 mg/dL — ABNORMAL HIGH (ref 0.44–1.00)
Creatinine, Ser: 1.43 mg/dL — ABNORMAL HIGH (ref 0.44–1.00)
GFR, Estimated: 33 mL/min — ABNORMAL LOW (ref >60)
GFR, Estimated: 37 mL/min — ABNORMAL LOW (ref >60)
Glucose, Bld: 88 mg/dL (ref 70–99)
Glucose, Bld: 167 mg/dL — ABNORMAL HIGH (ref 70–99)
Potassium: 2.7 mmol/L — CL (ref 3.5–5.1)
Potassium: 3.5 mmol/L (ref 3.5–5.1)
Sodium: 146 mmol/L — ABNORMAL HIGH (ref 135–145)
Sodium: 143 mmol/L (ref 135–145)

## 2024-04-14 LAB — FIBRINOGEN: Fibrinogen: >800 mg/dL — ABNORMAL HIGH (ref 210–475)

## 2024-04-14 LAB — HEPATITIS PANEL, ACUTE
HCV Ab: NONREACTIVE
Hep A IgM: NONREACTIVE
Hep B C IgM: NONREACTIVE
Hepatitis B Surface Ag: NONREACTIVE

## 2024-04-14 LAB — GLUCOSE, CAPILLARY
Glucose-Capillary: 103 mg/dL — ABNORMAL HIGH (ref 70–99)
Glucose-Capillary: 139 mg/dL — ABNORMAL HIGH (ref 70–99)
Glucose-Capillary: 145 mg/dL — ABNORMAL HIGH (ref 70–99)
Glucose-Capillary: 150 mg/dL — ABNORMAL HIGH (ref 70–99)
Glucose-Capillary: 161 mg/dL — ABNORMAL HIGH (ref 70–99)
Glucose-Capillary: 67 mg/dL — ABNORMAL LOW (ref 70–99)
Glucose-Capillary: 83 mg/dL (ref 70–99)
Glucose-Capillary: 90 mg/dL (ref 70–99)
Glucose-Capillary: 98 mg/dL (ref 70–99)

## 2024-04-14 LAB — COMPREHENSIVE METABOLIC PANEL WITH GFR
ALT: 1723 U/L — ABNORMAL HIGH (ref 0–44)
AST: 3489 U/L — ABNORMAL HIGH (ref 15–41)
Albumin: 2.1 g/dL — ABNORMAL LOW (ref 3.5–5.0)
Alkaline Phosphatase: 85 U/L (ref 38–126)
Anion gap: 17 — ABNORMAL HIGH (ref 5–15)
BUN: 45 mg/dL — ABNORMAL HIGH (ref 8–23)
CO2: 19 mmol/L — ABNORMAL LOW (ref 22–32)
Calcium: 7.5 mg/dL — ABNORMAL LOW (ref 8.9–10.3)
Chloride: 108 mmol/L (ref 98–111)
Creatinine, Ser: 1.96 mg/dL — ABNORMAL HIGH (ref 0.44–1.00)
GFR, Estimated: 25 mL/min — ABNORMAL LOW (ref >60)
Glucose, Bld: 81 mg/dL (ref 70–99)
Potassium: 2.7 mmol/L — CL (ref 3.5–5.1)
Sodium: 144 mmol/L (ref 135–145)
Total Bilirubin: 2.4 mg/dL — ABNORMAL HIGH (ref 0.0–1.2)
Total Protein: 5.3 g/dL — ABNORMAL LOW (ref 6.5–8.1)

## 2024-04-14 LAB — RAPID URINE DRUG SCREEN, HOSP PERFORMED
Amphetamines: NOT DETECTED
Barbiturates: NOT DETECTED
Benzodiazepines: NOT DETECTED
Cocaine: NOT DETECTED
Opiates: NOT DETECTED
Tetrahydrocannabinol: NOT DETECTED

## 2024-04-14 LAB — MAGNESIUM: Magnesium: 1.7 mg/dL (ref 1.7–2.4)

## 2024-04-14 LAB — DIC (DISSEMINATED INTRAVASCULAR COAGULATION)PANEL
D-Dimer, Quant: >20 ug{FEU}/mL — ABNORMAL HIGH (ref 0.00–0.50)
Fibrinogen: 69 mg/dL — CL (ref 210–475)
INR: 2.6 — ABNORMAL HIGH (ref 0.8–1.2)
Platelets: 41 K/uL — ABNORMAL LOW (ref 150–400)
Prothrombin Time: 29.2 s — ABNORMAL HIGH (ref 11.4–15.2)
Smear Review: NONE SEEN
aPTT: 42 s — ABNORMAL HIGH (ref 24–36)

## 2024-04-14 LAB — TSH: TSH: 0.591 u[IU]/mL (ref 0.350–4.500)

## 2024-04-14 LAB — T4, FREE: Free T4: 0.97 ng/dL (ref 0.61–1.12)

## 2024-04-14 LAB — APTT: aPTT: 53 s — ABNORMAL HIGH (ref 24–36)

## 2024-04-14 LAB — SALICYLATE LEVEL: Salicylate Lvl: <7 mg/dL — ABNORMAL LOW (ref 7.0–30.0)

## 2024-04-14 LAB — LACTIC ACID, PLASMA
Lactic Acid, Venous: 4.9 mmol/L (ref 0.5–1.9)
Lactic Acid, Venous: 3.2 mmol/L (ref 0.5–1.9)

## 2024-04-14 LAB — MRSA NEXT GEN BY PCR, NASAL: MRSA by PCR Next Gen: NOT DETECTED

## 2024-04-14 LAB — AMMONIA
Ammonia: 50 umol/L — ABNORMAL HIGH (ref 9–35)
Ammonia: 60 umol/L — ABNORMAL HIGH (ref 9–35)

## 2024-04-14 LAB — TROPONIN I (HIGH SENSITIVITY): Troponin I (High Sensitivity): 254 ng/L (ref <18)

## 2024-04-14 LAB — ACETAMINOPHEN LEVEL: Acetaminophen (Tylenol), Serum: <10 ug/mL — ABNORMAL LOW (ref 10–30)

## 2024-04-14 MED ORDER — POTASSIUM CHLORIDE 10 MEQ/100ML IV SOLN
10.0000 meq | INTRAVENOUS | Status: AC
  Administered 2024-04-14 (×4): 10 meq via INTRAVENOUS
  Filled 2024-04-14 (×4): qty 100

## 2024-04-14 MED ORDER — DEXTROSE 50 % IV SOLN
INTRAVENOUS | Status: AC
  Administered 2024-04-14: 25 g via INTRAVENOUS
  Filled 2024-04-14: qty 50

## 2024-04-14 MED ORDER — LACTULOSE 10 GM/15ML PO SOLN
20.0000 g | Freq: Three times a day (TID) | ORAL | Status: DC
  Administered 2024-04-14: 20 g
  Filled 2024-04-14: qty 30

## 2024-04-14 MED ORDER — POTASSIUM CHLORIDE 10 MEQ/100ML IV SOLN
10.0000 meq | INTRAVENOUS | Status: DC
  Administered 2024-04-14 (×3): 10 meq via INTRAVENOUS
  Filled 2024-04-14 (×3): qty 100

## 2024-04-14 MED ORDER — LACTULOSE 10 GM/15ML PO SOLN: 20.0000 g | Freq: Three times a day (TID) | ORAL | Status: DC

## 2024-04-14 MED ORDER — PIPERACILLIN-TAZOBACTAM 3.375 G IVPB
3.3750 g | Freq: Three times a day (TID) | INTRAVENOUS | Status: DC
  Administered 2024-04-14 – 2024-04-15 (×4): 3.375 g via INTRAVENOUS
  Filled 2024-04-14 (×4): qty 50

## 2024-04-14 MED ORDER — DEXTROSE 5 % IV SOLN
6.2500 mg/kg/h | INTRAVENOUS | Status: AC
  Administered 2024-04-14 – 2024-04-16 (×4): 6.25 mg/kg/h via INTRAVENOUS
  Filled 2024-04-14 (×3): qty 90

## 2024-04-14 MED ORDER — DEXTROSE 50 % IV SOLN: 12.5000 g | INTRAVENOUS | Status: AC

## 2024-04-14 MED ORDER — DEXTROSE 5 % IV SOLN
12.5000 mg/kg/h | INTRAVENOUS | Status: AC
  Administered 2024-04-14: 12.5 mg/kg/h via INTRAVENOUS
  Filled 2024-04-14: qty 90

## 2024-04-14 MED ORDER — SODIUM CHLORIDE 0.9 % IV SOLN: 250.0000 mL | INTRAVENOUS | Status: AC

## 2024-04-14 MED ORDER — PANTOPRAZOLE SODIUM 40 MG IV SOLR
40.0000 mg | Freq: Two times a day (BID) | INTRAVENOUS | Status: DC
  Administered 2024-04-14 – 2024-04-18 (×10): 40 mg via INTRAVENOUS
  Filled 2024-04-14 (×10): qty 10

## 2024-04-14 MED ORDER — ORAL CARE MOUTH RINSE: 15.0000 mL | OROMUCOSAL | Status: DC | PRN

## 2024-04-14 MED ORDER — VITAMIN K1 10 MG/ML IJ SOLN
10.0000 mg | Freq: Once | INTRAVENOUS | Status: AC
  Administered 2024-04-14: 10 mg via INTRAVENOUS
  Filled 2024-04-14: qty 1

## 2024-04-14 MED ORDER — ACETYLCYSTEINE LOAD VIA INFUSION
150.0000 mg/kg | Freq: Once | INTRAVENOUS | Status: AC
  Administered 2024-04-14: 11175 mg via INTRAVENOUS
  Filled 2024-04-14: qty 367

## 2024-04-14 MED ORDER — POLYETHYLENE GLYCOL 3350 17 G PO PACK: 17.0000 g | PACK | Freq: Every day | ORAL | Status: DC | PRN

## 2024-04-14 MED ORDER — SODIUM CHLORIDE 0.9 % IV SOLN
50.0000 ug/h | INTRAVENOUS | Status: DC
  Administered 2024-04-14 (×2): 50 ug/h via INTRAVENOUS
  Filled 2024-04-14 (×2): qty 1

## 2024-04-14 MED ORDER — POTASSIUM CHLORIDE 10 MEQ/100ML IV SOLN: 10.0000 meq | INTRAVENOUS | Status: DC

## 2024-04-14 MED ORDER — NOREPINEPHRINE 4 MG/250ML-% IV SOLN
0.0000 ug/min | INTRAVENOUS | Status: DC
  Administered 2024-04-14: 2 ug/min via INTRAVENOUS
  Filled 2024-04-14 (×2): qty 250

## 2024-04-14 MED ORDER — DEXTROSE 10 % IV SOLN: INTRAVENOUS | Status: DC

## 2024-04-14 MED ORDER — OCTREOTIDE LOAD VIA INFUSION
50.0000 ug | Freq: Once | INTRAVENOUS | Status: AC
  Administered 2024-04-14: 50 ug via INTRAVENOUS
  Filled 2024-04-14: qty 25

## 2024-04-14 MED ORDER — POTASSIUM CHLORIDE 20 MEQ PO PACK
40.0000 meq | PACK | Freq: Once | ORAL | Status: AC
  Administered 2024-04-14: 40 meq via ORAL
  Filled 2024-04-14: qty 2

## 2024-04-14 MED ORDER — LACTULOSE 10 GM/15ML PO SOLN
20.0000 g | Freq: Three times a day (TID) | ORAL | Status: DC
  Administered 2024-04-14: 20 g via ORAL
  Filled 2024-04-14: qty 30

## 2024-04-14 MED ORDER — DOCUSATE SODIUM 50 MG/5ML PO LIQD: 100.0000 mg | Freq: Two times a day (BID) | ORAL | Status: DC | PRN

## 2024-04-14 MED ORDER — ONDANSETRON HCL 4 MG/2ML IJ SOLN: 4.0000 mg | Freq: Four times a day (QID) | INTRAMUSCULAR | Status: AC | PRN

## 2024-04-14 MED ORDER — DEXMEDETOMIDINE HCL IN NACL 400 MCG/100ML IV SOLN
0.0000 ug/kg/h | INTRAVENOUS | Status: DC
  Administered 2024-04-14: 0.4 ug/kg/h via INTRAVENOUS
  Filled 2024-04-14: qty 100

## 2024-04-14 MED ORDER — POTASSIUM CHLORIDE 20 MEQ PO PACK
20.0000 meq | PACK | Freq: Once | ORAL | Status: AC
  Administered 2024-04-14: 20 meq
  Filled 2024-04-14: qty 1

## 2024-04-14 NOTE — Plan of Care (Signed)

## 2024-04-14 NOTE — Progress Notes (Addendum)
 Now requiring low dose levophed  Needed precedex  for NG placement AM labs resulted late Still has hypokalemia- will replace and recheck Start lactulose   DIC labs worse, LFT and lactate improving Protecting airways Keep NPO Will keep on D10 at 40/hr Repeat H/H is still pending but am labs were good. No cirrhosis on liver US . Can probably d/c octreotide .  Spoke with GI, no additional recs at this time, conservative management. Can d/c octreotide .   Unable to reach daughter Spoke to son who hasn't been living with her He stated she is very hard of hearing at baseline

## 2024-04-14 NOTE — Progress Notes (Addendum)
 eLink Physician-Brief Progress Note Patient Name: [name] DOB: Jun 16, 1945 MRN: [id number]   Date of Service  04/14/2024  HPI/Events of Note  78 year old with a history of CVA, COPD, DVTs who presented unresponsive and septic shock likely secondary to urinary tract infection and acute liver failure, acute kidney injury and some myocardial injury.  Vitals, labs, and imaging reviewed.  eICU Interventions  Add lactulose , N-acetylcysteine  LR bolus and infusion, norepinephrine  infusion as needed for MAP greater than 65 DVT prophylaxis with SCDs No GI prophylaxis indicated   0416 -nausea and vomiting, had a bout of coffee-ground emesis.  Received Protonix  40 mg earlier, will initiate 40 mg IV twice daily, initiate octreotide , unclear whether there is evidence of portal hypertension.  Already on Zosyn .  Protecting her airway with no evidence of hypoxemia for now.  Intervention Category Evaluation Type: New Patient Evaluation  [name] 04/14/2024, 1:33 AM

## 2024-04-14 NOTE — Progress Notes (Addendum)
 NAME:  [name], MRN:  [id number], DOB:  05-Dec-1945, LOS: 1 ADMISSION DATE:  04/13/2024, CHIEF COMPLAINT:  Altered Mental Status.   Initial History of Presenting Illness:  This 78 year old woman presented to the ER after EMS was called by her roommate as she was not responsive.  She has a history of CVA, heart failure with preserved ejection fraction, DVT not on anticoagulation, hypertension, falls in the past, E. coli UTI.  Patient could not give me any history.  I called the son who apparently has not been in touch with his mother recently.  He recommended me to call the patient's daughter which I did and she could not pick up the phone but son said she is planning on coming to the hospital tomorrow.  Patient apparently complained of chest pain and was very confused.  The roommate of this patient called EMS.  When EMS arrived the roommate was not there.  Apparently nobody was there at home.  Patient was laying in bed not much responsive.  She was underneath a single blanket.  Home is without heat or lights on.  She was very cool to palpitation.  She was initially hypotensive and hypoglycemic.  As she was being rewarmed with the Bair hugger IV fluids and dextrose  were given her blood pressure was significantly improved.  Patient was apparently covered in feces.  She is found to be profoundly dehydrated with dry skin turgor.  The emergency room patient was found to be in acute liver failure with coagulopathy.  Her UA was positive.  Patient is given IV fluids dextrose  and is rewarmed.  She is alert with GCS of 10-12.  She is maintaining her airway.  She is hemodynamically stable not needing any vasopressor therapy.  She is being rewarmed.  BNP 326 Bedside POCUS is attempted EF seems low, no pericardial effusion however patient was not much cooperative given that she was needing rewarming.  Will get a formal echo in the morning.  Pertinent  Medical History   Past Medical History:  Diagnosis  Date   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 (HCC) 09/16/2020   COPD (chronic obstructive pulmonary disease) (HCC)    History of DVT (deep vein thrombosis)    Hypertension    Thoracoabdominal aortic aneurysm    TIA (transient ischemic attack) 09/10/2020     Significant Hospital Events: Including procedures, antibiotic start and stop dates in addition to other pertinent events   04/13/2024 -patient presented to [facility], ER with confusion found to have elevated LFTs.  Interim History / Subjective:  Patient is admitted to ICU for close airway watch and hemodynamic monitoring. Had coffee ground emesis last night, octreotide  started  Objective    Blood pressure (!) 141/53, pulse (!) 54, temperature (!) 95.9 F (35.5 C), resp. rate 12, height 5' 10 (1.778 m), weight 74.5 kg, SpO2 99%.        Intake/Output Summary (Last 24 hours) at 04/14/2024 0739 Last data filed at 04/14/2024 0700 Gross per 24 hour  Intake 6252.91 ml  Output 145 ml  Net 6107.91 ml   Filed Weights   04/13/24 2023 04/14/24 0100  Weight: 85.1 kg 74.5 kg    Examination: Physical exam: General: Chronically ill-appearing female, lying on the bed, alert but not oriented.  HEENT: [place]/AT, eyes anicteric.  moist mucus membranes Neuro: Alert, awake, tracks with eyes, hasn't moved left side Chest: Coarse breath sounds, no wheezes or rhonchi Heart: Regular rate and rhythm, no murmurs or  gallops Abdomen: Soft, nontender, nondistended, bowel sounds present   Resolved problem list   Assessment and Plan  Septic Shock  UTI - empiric cefepime  vancomycin  and Flagyl  in the ER - She has history of E. coli UTI we will continue IV Zosyn  for now - Follow urine and blood cultures - s/p 6 L since admission - Continue hemodynamic monitoring goal MAP above 65 not needing any vasopressor therapy currently.   Acute Liver Failure/?shock liver Altered Mental Status - GCS  12 Hypothermia Hypoglycemia Coagulopathy Hyperammonemia Coffee ground emesis  - Bair hugger to continue- aim normothermia - CT head did not show anything acute - The CT abdomen pelvis did not show anything acute - Ultrasound abdomen shows hepatic steatosis - UDS negative, Tylenol  levels and aspirin  levels within normal limits  - Hepatitis panel pending - TSH  within normal limits , ammonia mildly elevated - Every 2 hour POC blood sugar checks - Maintaining airway right now monitor airway closely in the ICU - trend liver enzymes, continue NAC protocol - trend coagulopathy labs - place NG, start TF - continue octreotide , PPI BID, trend h/h. No evidence of acute/overt gi bleed so far  Acute Kidney Injury -likely prerenal from sepsis and dehydration Hypokalemia Severe Lactic Acidosis Severe dehydration  - creatinine mildly improved from last night, UO remains low. - maintain perfusion - monitor Renal functions, electrolytes and urine output - renal US    Chest pain Elevated troponin Likely type 2 MI History of heart failure with preserved ejection fraction H/o Hypertension Stable infrarenal abdominal aortic aneurysm 4.2 cm  - Echocardiogram pending - trop negative - Telemetry - Patient got aspirin  in the ER. - Hold home dose lasix , amlodipine , hydralazine  - Need follow-up CT for AAA in 1 year.  Hypokalemia Replaced Repeat pending Check magnesium   H/o CVA, SAH, COPD, DVT   Full code  Pt unable to provide any history. Will speak with family when available/if able to reach   Labs   CBC: Recent Labs  Lab 04/13/24 2123 04/13/24 2132  WBC 6.8  --   NEUTROABS 6.2  --   HGB 11.9* 12.6  13.3  HCT 36.4 37.0  39.0  MCV 90.5  --   PLT 56*  --     Basic Metabolic Panel: Recent Labs  Lab 04/13/24 2123 04/13/24 2132 04/13/24 2308  NA 145 146*  147* 144  K 2.6* 2.5*  2.5* 2.7*  CL 106 106 108  CO2 20*  --  19*  GLUCOSE 122* 120* 81  BUN 46* 45* 45*   CREATININE 2.38* 2.20* 1.96*  CALCIUM  8.6*  --  7.5*   GFR: Estimated Creatinine Clearance: 24.8 mL/min (A) (by C-G formula based on SCr of 1.96 mg/dL (H)). Recent Labs  Lab 04/13/24 2123 04/13/24 2132 04/13/24 2314  WBC 6.8  --   --   LATICACIDVEN  --  8.8* 7.1*    Liver Function Tests: Recent Labs  Lab 04/13/24 2123 04/13/24 2308  AST 4,750* 3,489*  ALT 2,226* 1,723*  ALKPHOS 102 85  BILITOT 2.6* 2.4*  PROT 7.0 5.3*  ALBUMIN  2.7* 2.1*   Recent Labs  Lab 04/13/24 2123  LIPASE 106*   Recent Labs  Lab 04/14/24 0005  AMMONIA 50*    ABG    Component Value Date/Time   HCO3 21.5 04/13/2024 2132   TCO2 20 (L) 04/13/2024 2132   TCO2 23 04/13/2024 2132   ACIDBASEDEF 4.0 (H) 04/13/2024 2132   O2SAT 56 04/13/2024 2132  Coagulation Profile: Recent Labs  Lab 04/13/24 2123  INR 2.3*    Cardiac Enzymes: No results for input(s): CKTOTAL, CKMB, CKMBINDEX, TROPONINI in the last 168 hours.  HbA1C: Hgb A1c MFr Bld  Date/Time Value Ref Range Status  09/11/2020 05:03 PM 5.3 4.8 - 5.6 % Final    Comment:    (NOTE) Pre diabetes:          5.7%-6.4%  Diabetes:              >6.4%  Glycemic control for   <7.0% adults with diabetes     CBG: Recent Labs  Lab 04/13/24 2022 04/14/24 0046 04/14/24 0104 04/14/24 0326 04/14/24 0609  GLUCAP 154* 67* 150* 139* 98    Review of Systems:   Review of systems could not be done as the patient is confused.  Past Medical History:  She,  has a past medical history of 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 [facility]) (09/16/2020), COPD (chronic obstructive pulmonary disease) (HCC), History of DVT (deep vein thrombosis), Hypertension, Thoracoabdominal aortic aneurysm, and TIA (transient ischemic attack) (09/10/2020).   Surgical History:   Past Surgical History:  Procedure Laterality Date   IR BONE MARROW BIOPSY & ASPIRATION  12/13/2023   IR CT HEAD LTD  09/11/2020    IR INTRAVSC STENT CERV CAROTID W/O EMB-PROT MOD SED INC ANGIO  09/11/2020   IR PERCUTANEOUS ART THROMBECTOMY/INFUSION INTRACRANIAL INC DIAG ANGIO  09/11/2020       IR PERCUTANEOUS ART THROMBECTOMY/INFUSION INTRACRANIAL INC DIAG ANGIO  09/11/2020   IR US  GUIDE VASC ACCESS RIGHT  09/11/2020   NO PAST SURGERIES     RADIOLOGY WITH ANESTHESIA N/A 09/11/2020   Procedure: IR WITH ANESTHESIA;  Surgeon: Dolphus Carrion, MD;  Location: [facility] OR;  Service: Radiology;  Laterality: N/A;     Social History:   reports that she quit smoking about 36 years ago. Her smoking use included cigarettes. She has never used smokeless tobacco. She reports that she does not currently use alcohol. She reports that she does not use drugs.   Family History:  Her family history includes Hypertension in her mother.   Allergies Allergies  Allergen Reactions   Iodinated Contrast Media Itching and Other (See Comments)    9/1 developed diffuse itching after contrast dye for CT (without rash or airway involvement or GI symptoms) . Consider pretreatment before IV contrast in the future      Home Medications  Prior to Admission medications   Medication Sig Start Date End Date Taking? Authorizing Provider  albuterol  (VENTOLIN  HFA) 108 (90 Base) MCG/ACT inhaler Inhale 2 puffs into the lungs every 6 (six) hours as needed for wheezing or shortness of breath. 01/09/24   Darci Pore, MD  amLODipine  (NORVASC ) 10 MG tablet Take 1 tablet (10 mg total) by mouth daily. 12/15/23   Rojelio Nest, DO  furosemide  (LASIX ) 40 MG tablet Take 1 tablet (40 mg total) by mouth daily. 01/19/24   Patsy Lenis, MD  hydrALAZINE  (APRESOLINE ) 50 MG tablet Take 1 tablet (50 mg total) by mouth every 8 (eight) hours. 12/15/23   Rojelio Nest, DO  hydrOXYzine  (ATARAX ) 25 MG tablet Take 1 tablet (25 mg total) by mouth 3 (three) times daily as needed for anxiety. 01/19/24   Patsy Lenis, MD  pantoprazole  (PROTONIX ) 40 MG tablet Take 1 tablet (40 mg total) by  mouth daily. 12/15/23   Rojelio Nest, DO  polyethylene glycol (MIRALAX  / GLYCOLAX ) 17 g packet Take 17 g by mouth  daily. 12/15/23   Rojelio Nest, DO  rosuvastatin  (CRESTOR ) 20 MG tablet TAKE 1 TABLET(20 MG) BY MOUTH DAILY Patient taking differently: Take 20 mg by mouth daily. 08/23/23   Jaycee Greig PARAS, NP     Critical care time: 23 Minutes.   Jacory Kamel Pleas, MD Pulmonary and Critical Care Medicine [facility]  04/14/2024 7:39 AM Pager: see AMION  If no response to pager, please call critical care on call (see AMION) until 7pm After 7:00 pm call Elink

## 2024-04-14 NOTE — Progress Notes (Signed)
 Pharmacy Electrolyte Replacement  Recent Labs:  Recent Labs    04/14/24 0957 04/14/24 1958  K 2.7* 3.5  MG 1.7  --   CREATININE 1.59* 1.43*    Low Critical Values (K </= 2.5, Phos </= 1, Mg </= 1) Present: None  MD Contacted: N/A  Plan: KCl per tube x 1, f/u BMET in AM  Rocky Slade, PharmD, BCPS 04/14/2024 9:05 PM

## 2024-04-15 ENCOUNTER — Inpatient Hospital Stay (HOSPITAL_COMMUNITY)

## 2024-04-15 DIAGNOSIS — J449 Chronic obstructive pulmonary disease, unspecified: Secondary | ICD-10-CM | POA: Diagnosis not present

## 2024-04-15 DIAGNOSIS — R7401 Elevation of levels of liver transaminase levels: Secondary | ICD-10-CM

## 2024-04-15 DIAGNOSIS — E872 Acidosis, unspecified: Secondary | ICD-10-CM | POA: Diagnosis not present

## 2024-04-15 DIAGNOSIS — K219 Gastro-esophageal reflux disease without esophagitis: Secondary | ICD-10-CM | POA: Diagnosis not present

## 2024-04-15 DIAGNOSIS — R4182 Altered mental status, unspecified: Secondary | ICD-10-CM | POA: Diagnosis not present

## 2024-04-15 DIAGNOSIS — E876 Hypokalemia: Secondary | ICD-10-CM | POA: Diagnosis not present

## 2024-04-15 DIAGNOSIS — F32A Depression, unspecified: Secondary | ICD-10-CM | POA: Diagnosis not present

## 2024-04-15 DIAGNOSIS — N179 Acute kidney failure, unspecified: Secondary | ICD-10-CM | POA: Diagnosis not present

## 2024-04-15 LAB — GLUCOSE, CAPILLARY
Glucose-Capillary: 148 mg/dL — ABNORMAL HIGH (ref 70–99)
Glucose-Capillary: 143 mg/dL — ABNORMAL HIGH (ref 70–99)
Glucose-Capillary: 125 mg/dL — ABNORMAL HIGH (ref 70–99)
Glucose-Capillary: 141 mg/dL — ABNORMAL HIGH (ref 70–99)
Glucose-Capillary: 117 mg/dL — ABNORMAL HIGH (ref 70–99)
Glucose-Capillary: 112 mg/dL — ABNORMAL HIGH (ref 70–99)

## 2024-04-15 LAB — DIC (DISSEMINATED INTRAVASCULAR COAGULATION)PANEL
D-Dimer, Quant: >20 ug{FEU}/mL — ABNORMAL HIGH (ref 0.00–0.50)
Fibrinogen: 79 mg/dL — CL (ref 210–475)
INR: 1.9 — ABNORMAL HIGH (ref 0.8–1.2)
Platelets: 43 K/uL — ABNORMAL LOW (ref 150–400)
Prothrombin Time: 22.9 s — ABNORMAL HIGH (ref 11.4–15.2)
Smear Review: NONE SEEN
aPTT: 37 s — ABNORMAL HIGH (ref 24–36)

## 2024-04-15 LAB — CBC
HCT: 28.9 % — ABNORMAL LOW (ref 36.0–46.0)
Hemoglobin: 9.6 g/dL — ABNORMAL LOW (ref 12.0–15.0)
MCH: 29.8 pg (ref 26.0–34.0)
MCHC: 33.2 g/dL (ref 30.0–36.0)
MCV: 89.8 fL (ref 80.0–100.0)
Platelets: 41 K/uL — ABNORMAL LOW (ref 150–400)
RBC: 3.22 MIL/uL — ABNORMAL LOW (ref 3.87–5.11)
RDW: 15.6 % — ABNORMAL HIGH (ref 11.5–15.5)
WBC: 5.7 K/uL (ref 4.0–10.5)
nRBC: 1.1 % — ABNORMAL HIGH (ref 0.0–0.2)

## 2024-04-15 LAB — ECHOCARDIOGRAM COMPLETE
Area-P 1/2: 2.32 cm2
Calc EF: 51.5 %
Height: 70 in
S' Lateral: 3.7 cm
Single Plane A2C EF: 54.2 %
Single Plane A4C EF: 48.2 %
Weight: 2627.88 [oz_av]

## 2024-04-15 LAB — COMPREHENSIVE METABOLIC PANEL WITH GFR
ALT: 1157 U/L — ABNORMAL HIGH (ref 0–44)
AST: 1475 U/L — ABNORMAL HIGH (ref 15–41)
Albumin: 1.8 g/dL — ABNORMAL LOW (ref 3.5–5.0)
Alkaline Phosphatase: 90 U/L (ref 38–126)
Anion gap: 10 (ref 5–15)
BUN: 38 mg/dL — ABNORMAL HIGH (ref 8–23)
CO2: 21 mmol/L — ABNORMAL LOW (ref 22–32)
Calcium: 7.4 mg/dL — ABNORMAL LOW (ref 8.9–10.3)
Chloride: 114 mmol/L — ABNORMAL HIGH (ref 98–111)
Creatinine, Ser: 1.27 mg/dL — ABNORMAL HIGH (ref 0.44–1.00)
GFR, Estimated: 43 mL/min — ABNORMAL LOW (ref >60)
Glucose, Bld: 147 mg/dL — ABNORMAL HIGH (ref 70–99)
Potassium: 4.9 mmol/L (ref 3.5–5.1)
Sodium: 145 mmol/L (ref 135–145)
Total Bilirubin: 2 mg/dL — ABNORMAL HIGH (ref 0.0–1.2)
Total Protein: 5.5 g/dL — ABNORMAL LOW (ref 6.5–8.1)

## 2024-04-15 LAB — LACTIC ACID, PLASMA
Lactic Acid, Venous: 3.7 mmol/L (ref 0.5–1.9)
Lactic Acid, Venous: 3.8 mmol/L (ref 0.5–1.9)
Lactic Acid, Venous: 3.9 mmol/L (ref 0.5–1.9)
Lactic Acid, Venous: 4.5 mmol/L (ref 0.5–1.9)

## 2024-04-15 LAB — CK: Total CK: 232 U/L (ref 38–234)

## 2024-04-15 LAB — MAGNESIUM: Magnesium: 1.7 mg/dL (ref 1.7–2.4)

## 2024-04-15 LAB — PROTIME-INR
INR: 1.4 — ABNORMAL HIGH (ref 0.8–1.2)
Prothrombin Time: 17.9 s — ABNORMAL HIGH (ref 11.4–15.2)

## 2024-04-15 MED ORDER — DOCUSATE SODIUM 50 MG/5ML PO LIQD
100.0000 mg | Freq: Two times a day (BID) | ORAL | Status: DC | PRN
  Administered 2024-05-11 – 2024-05-15 (×3): 100 mg via ORAL
  Filled 2024-04-15: qty 10

## 2024-04-15 MED ORDER — ALBUMIN HUMAN 25 % IV SOLN
25.0000 g | Freq: Once | INTRAVENOUS | Status: AC
  Administered 2024-04-15: 25 g via INTRAVENOUS
  Filled 2024-04-15: qty 100

## 2024-04-15 MED ORDER — LACTULOSE 10 GM/15ML PO SOLN
20.0000 g | Freq: Three times a day (TID) | ORAL | Status: DC
  Administered 2024-04-15 – 2024-05-02 (×41): 20 g via ORAL
  Filled 2024-04-15 (×44): qty 30

## 2024-04-15 MED ORDER — ORAL CARE MOUTH RINSE
15.0000 mL | OROMUCOSAL | Status: AC
  Administered 2024-04-16 – 2024-06-14 (×181): 15 mL via OROMUCOSAL

## 2024-04-15 MED ORDER — POLYETHYLENE GLYCOL 3350 17 G PO PACK
17.0000 g | PACK | Freq: Every day | ORAL | Status: DC | PRN
  Administered 2024-05-11 – 2024-05-15 (×3): 17 g via ORAL
  Filled 2024-04-15: qty 1

## 2024-04-15 MED ORDER — VITAMIN K1 10 MG/ML IJ SOLN
10.0000 mg | Freq: Every day | INTRAVENOUS | Status: AC
  Administered 2024-04-15 – 2024-04-16 (×2): 10 mg via INTRAVENOUS
  Filled 2024-04-15 (×2): qty 1

## 2024-04-15 MED ORDER — ORAL CARE MOUTH RINSE: 15.0000 mL | OROMUCOSAL | Status: AC | PRN

## 2024-04-15 MED ORDER — IPRATROPIUM-ALBUTEROL 0.5-2.5 (3) MG/3ML IN SOLN: 3.0000 mL | Freq: Four times a day (QID) | RESPIRATORY_TRACT | Status: AC | PRN

## 2024-04-15 NOTE — Procedures (Signed)
 Modified Barium Swallow Study  Patient Details  Name: [name] MRN: [id number] Date of Birth: 1946/02/10  Today's Date: 04/15/2024  Modified Barium Swallow completed.  Full report located under Chart Review in the Imaging Section.  History of Present Illness [name] is a 78 y/o female presenting 04/14/24 after roommate called EMS as the patient was unresponsive. She reports chest pain and was very confused. Pt found under single blanket, alone with no heat or lights on in home.   PMHx:  HTN, HLD, CVA left hemiparesis, CAPD, DVT, IVH. UA positive. SLP consulted for clinical swallow assessment.   Clinical Impression  Pt presents with a mild-moderate oropharyngeal dysphagia per MBSS completed today. Recommend a mechanical altered/ground diet with nectar-thick liquids (straw ok), and meds crushed in applesauce.   Oral deficits included reduced oral strength and coordination resulting in intermittent anterior loss of liquids by spoon or cup sip. Pt impulsively drank all liquids and was unable to try single sips despite cues. There was consistent posterior loss of thin and nectar-thick liquids to the level of the pyriform sinuses pre swallow with some pooling. There was piecemeal swallow of pudding and softened cracker. Min cracker residue on hard palate cleared with prompted nectar-thick liquid wash.   Pharyngeal deficits included reduced base of tongue retraction, reduced laryngeal vestibule closure, reduced pharyngeal stripping, and reduced distention of UES.   Findings -There was transient aspiration of thin liquid residue during cleansing swallows. Residue was ejected to the level of the vocal cords (PAS-5) or the laryngeal vestibule (PAS-3) during swallow, which was inaudible. No stagnant aspiration in the airway observed.  -There x1 instance of stagnant penetration of nectar-thick liquid wash post cracker. Penetrated residue did start to progress to level of the vocal cords (PAS3 - 5);  though ejected with cued throat clear + swallow. All other penetration instances of nectar-thick liquids were transient.  -There was min diffuse pharyngeal residue following liquids and pudding. A moderate amount of pharyngeal residue observed post cracker trial, which was cleared with cued dry swallow.  Plan: SLP will follow up to assess diet tolerance and modify diet as indicated. Pt may benefit from a repeat MBSS prior to liquid advancement given that inaudible responses to penetration/aspiration events.   Factors that may increase risk of adverse event in presence of aspiration [name] 2021): Reduced cognitive function;Limited mobility;Frail or deconditioned;Dependence for feeding and/or oral hygiene;Inadequate oral hygiene;Reduced saliva;Weak cough  Swallow Evaluation Recommendations Recommendations: PO diet PO Diet Recommendation: Dysphagia 2 (Finely chopped);Mildly thick liquids (Level 2, nectar thick) Liquid Administration via: Cup;Straw Medication Administration: Crushed with puree Supervision: Staff to assist with self-feeding;Full supervision/cueing for swallowing strategies Swallowing strategies  : Minimize environmental distractions;Slow rate;Small bites/sips;Clear throat intermittently;Follow solids with liquids Postural changes: Position pt fully upright for meals Oral care recommendations: Oral care BID (2x/day)      [name] 04/15/2024,3:24 PM

## 2024-04-15 NOTE — Plan of Care (Signed)
  Problem: SLP Dysphagia Goals Goal: Misc Dysphagia Goal 04/15/2024 1516 by Henry Peyton PARAS, CCC-SLP Outcome: Completed/Met Flowsheets (Taken 04/15/2024 1245) Misc Dysphagia Goal: Pt will participate in a MBSS to objectively assess swallow function and make safest diet recommendations. 04/15/2024 1245 by Henry Peyton PARAS, CCC-SLP Flowsheets (Taken 04/15/2024 1245) Misc Dysphagia Goal: Pt will participate in a MBSS to objectively assess swallow function and make safest diet recommendations.

## 2024-04-15 NOTE — Progress Notes (Addendum)
 NAME:  [name], MRN:  [id number], DOB:  [birth year], LOS: 2 ADMISSION DATE:  04/13/2024, CHIEF COMPLAINT:  Altered Mental Status.   Initial History of Presenting Illness:  This 78 year old woman presented to the ER after EMS was called by her roommate as she was not responsive.  She has a history of CVA, heart failure with preserved ejection fraction, DVT not on anticoagulation, hypertension, falls in the past, E. coli UTI.  Patient could not give me any history.  I called the son who apparently has not been in touch with his mother recently.  He recommended me to call the patient's daughter which I did and she could not pick up the phone but son said she is planning on coming to the hospital tomorrow.  Patient apparently complained of chest pain and was very confused.  The roommate of this patient called EMS.  When EMS arrived the roommate was not there.  Apparently nobody was there at home.  Patient was laying in bed not much responsive.  She was underneath a single blanket.  Home is without heat or lights on.  She was very cool to palpitation.  She was initially hypotensive and hypoglycemic.  As she was being rewarmed with the Bair hugger IV fluids and dextrose  were given her blood pressure was significantly improved.  Patient was apparently covered in feces.  She is found to be profoundly dehydrated with dry skin turgor.  The emergency room patient was found to be in acute liver failure with coagulopathy.  Her UA was positive.  Patient is given IV fluids dextrose  and is rewarmed.  She is alert with GCS of 10-12.  She is maintaining her airway.  She is hemodynamically stable not needing any vasopressor therapy.  She is being rewarmed.  BNP 326 Bedside POCUS is attempted EF seems low, no pericardial effusion however patient was not much cooperative given that she was needing rewarming.  Will get a formal echo in the morning.  Pertinent  Medical History   Past Medical History:  Diagnosis  Date   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 (HCC) 09/16/2020   COPD (chronic obstructive pulmonary disease) (HCC)    History of DVT (deep vein thrombosis)    Hypertension    Thoracoabdominal aortic aneurysm    TIA (transient ischemic attack) 09/10/2020     Significant Hospital Events: Including procedures, antibiotic start and stop dates in addition to other pertinent events   04/13/2024 -patient presented to [facility], ER with confusion found to have elevated LFTs, Had coffee ground emesis last night, octreotide  started 12/7 LFT and lactate improving, DC octreotide , low-dose Levophed   Interim History / Subjective:   Improving critically ill Off Levophed  Good urine output 1 L  Objective    Blood pressure (!) 79/51, pulse 60, temperature (!) 97.2 F (36.2 C), resp. rate 13, height 5' 10 (1.778 m), weight 74.5 kg, SpO2 98%.        Intake/Output Summary (Last 24 hours) at 04/15/2024 1247 Last data filed at 04/15/2024 1100 Gross per 24 hour  Intake 1707.52 ml  Output 1075 ml  Net 632.52 ml   Filed Weights   04/13/24 2023 04/14/24 0100  Weight: 85.1 kg 74.5 kg    Examination: Physical exam: General: Chronically ill-appearing female, lying on the bed HEENT: [place]/AT, eyes anicteric.  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, awake, follows one-step commands, moves right side well Chest: Clear breath sounds bilateral, no accessory muscle use Heart: Regular rate  and rhythm, no murmurs or gallops Abdomen: Soft, nontender, nondistended, bowel sounds present   Labs show normal electrolytes, BUN/creatinine decreased 38/1.3, decreasing AST/ALT, bilirubin stable at 2.0, no leukocytosis, stable anemia and thrombocytopenia Fibrinogen  stays low at 79, INR 1.9  Resolved problem list    Hypothermia Hypoglycemia     Assessment and Plan  Undifferentiated shock, resolved now off pressors, received IV fluids, culture  negative UTI -history of E. coli UTI  - empiric cefepime  vancomycin  and Flagyl  in the ER - Okay to DC antibiotics   Acute Liver Failure-unclear etiology, statins noted -CK not too high, profile not consistent with shock liver Coagulopathy Coffee ground emesis  - Ultrasound abdomen shows hepatic steatosis - UDS negative, Tylenol  neg - TSH  within normal limits , ammonia mildly elevated - Obtain GI input -Complete course of NALC - Vitamin K  IV x 3 doses    Acute encephalopathy on admission, improving CT head neg Hyperammonemia  -Improved     Acute Kidney Injury -likely prerenal from dehydration, renal ultrasound negative Severe Lactic Acidosis , slow clearance due to hepatic injury Severe dehydration  -Improved - monitor Renal functions, electrolytes and urine output   Chest pain Elevated troponin Likely type 2 MI History of heart failure with preserved ejection fraction H/o Hypertension Stable infrarenal abdominal aortic aneurysm 4.2 cm  - Echocardiogram pending - trop negative - Telemetry - Patient got aspirin  in the ER. - Hold home dose lasix , amlodipine , hydralazine  - Need follow-up CT for AAA in 1 year.   H/o CVA, SAH, COPD, DVT   Full code Son was contacted , not very involved, unable to reach daughters    Labs   CBC: Recent Labs  Lab 04/13/24 2123 04/13/24 2132 04/14/24 0957 04/15/24 0248  WBC 6.8  --  5.1 5.7  NEUTROABS 6.2  --   --   --   HGB 11.9* 12.6  13.3 9.5* 9.6*  HCT 36.4 37.0  39.0 28.8* 28.9*  MCV 90.5  --  90.9 89.8  PLT 56*  --  52*  41* 43*  41*    Basic Metabolic Panel: Recent Labs  Lab 04/13/24 2123 04/13/24 2132 04/13/24 2308 04/14/24 0957 04/14/24 1958 04/15/24 0248  NA 145 146*  147* 144 146* 143 145  K 2.6* 2.5*  2.5* 2.7* 2.7* 3.5 4.9  CL 106 106 108 110 110 114*  CO2 20*  --  19* 18* 20* 21*  GLUCOSE 122* 120* 81 88 167* 147*  BUN 46* 45* 45* 42* 40* 38*  CREATININE 2.38* 2.20* 1.96* 1.59* 1.43*  1.27*  CALCIUM  8.6*  --  7.5* 7.4* 7.4* 7.4*  MG  --   --   --  1.7  --  1.7   GFR: Estimated Creatinine Clearance: 38.2 mL/min (A) (by C-G formula based on SCr of 1.27 mg/dL (H)). Recent Labs  Lab 04/13/24 2123 04/13/24 2132 04/14/24 0957 04/14/24 1958 04/15/24 0248 04/15/24 0810 04/15/24 0955  WBC 6.8  --  5.1  --  5.7  --   --   LATICACIDVEN  --    < > 4.9* 3.2*  --  3.7* 3.8*   < > = values in this interval not displayed.    Liver Function Tests: Recent Labs  Lab 04/13/24 2123 04/13/24 2308 04/14/24 0957 04/14/24 1958 04/15/24 0248  AST 4,750* 3,489* 2,619* 1,795* 1,475*  ALT 2,226* 1,723* 1,437* 1,236* 1,157*  ALKPHOS 102 85 82 82 90  BILITOT 2.6* 2.4* 2.2* 2.0* 2.0*  PROT 7.0 5.3*  5.4* 5.3* 5.5*  ALBUMIN  2.7* 2.1* 1.9* 1.9* 1.8*   Recent Labs  Lab 04/13/24 2123  LIPASE 106*   Recent Labs  Lab 04/14/24 0005 04/14/24 0957  AMMONIA 50* 60*    ABG    Component Value Date/Time   HCO3 21.5 04/13/2024 2132   TCO2 20 (L) 04/13/2024 2132   TCO2 23 04/13/2024 2132   ACIDBASEDEF 4.0 (H) 04/13/2024 2132   O2SAT 56 04/13/2024 2132     Coagulation Profile: Recent Labs  Lab 04/13/24 2123 04/14/24 0957 04/15/24 0248  INR 2.3* 2.6* 1.9*    Cardiac Enzymes: Recent Labs  Lab 04/15/24 0955  CKTOTAL 232    HbA1C: Hgb A1c MFr Bld  Date/Time Value Ref Range Status  09/11/2020 05:03 PM 5.3 4.8 - 5.6 % Final    Comment:    (NOTE) Pre diabetes:          5.7%-6.4%  Diabetes:              >6.4%  Glycemic control for   <7.0% adults with diabetes     CBG: Recent Labs  Lab 04/14/24 1946 04/14/24 2319 04/15/24 0331 04/15/24 0731 04/15/24 1121  GLUCAP 161* 145* 148* 143* 125*    My independent critical care time was 33 minutes  Harden Staff MD. FCCP. [place] [facility] Pager : [phone]  If no response to pager , please call [phone] until 7 pm After 7:00 pm call Elink  [phone]   04/15/2024

## 2024-04-15 NOTE — Plan of Care (Signed)
  Problem: SLP Dysphagia Goals Goal: Misc Dysphagia Goal 04/15/2024 1516 by Henry Peyton PARAS, CCC-SLP Outcome: Completed/Met Flowsheets (Taken 04/15/2024 1245) Misc Dysphagia Goal: Pt will participate in a MBSS to objectively assess swallow function and make safest diet recommendations. 04/15/2024 1245 by Henry Peyton PARAS, CCC-SLP Flowsheets (Taken 04/15/2024 1245) Misc Dysphagia Goal: Pt will participate in a MBSS to objectively assess swallow function and make safest diet recommendations.   Problem: SLP Dysphagia Goals Goal: Misc Dysphagia Goal Flowsheets (Taken 04/15/2024 1517) Misc Dysphagia Goal: Pt will tolerate least restrictive diet with no overt or subtle s/s of aspiration or decline in pulmonary status.

## 2024-04-15 NOTE — Progress Notes (Signed)
 Discussed patient's care with son who states that the patient lives with her daughters at home. He was not able to give much collateral. He did state that the patient does Live with Shona who is the patient's daughter.   Spoke with daughter, Shona, who stated that on the day the patient was brought to the ED Shona was at church when all of this happened. They have a roommate named JR who stated the the patient may have had a heart attack and so JR called EMS.   JR stated that all he knows is that he went to get something from the kitchen and he heard her moaning. So he went to go see her and she stated that she was not feeling well and that she wanted JR to call EMS.   At baseline patient is not able to do any of her ADLs and iADLs, but she is able to manage her own finances.   Per daughter, Lilyana takes a lot of aspirin  per her daughter.   Morena is a FULL CODE

## 2024-04-15 NOTE — Evaluation (Signed)
 Physical Therapy Evaluation Patient Details Name: [name] MRN: [id number] DOB: 18-Feb-1946 Today's Date: 04/15/2024  History of Present Illness  Pt is a 78 y/o female presenting 04/14/24 after roommate called EMS as the patient was unresponsive. She reports chest pain and was very confused. Pt found under single blanket, alone with no heat or lights on in home.   PMHx:  HTN, HLD, CVA left hemiparesis, CAPD, DVT, IVH. UA positive.  Clinical Impression  Based on thorough chart review as patient is a questionable historian and presenting with cognitive deficits, she was living in an apartment with family/friend assistance and primarily bed bound with intermittent use of a wheelchair. At time of evaluation, patient requires +2 for sit<>supine and scooting in bed due to poor initiation, left hemiparesis from previous stroke, decreased functional strength to RUE and RLE, impaired sitting balance with at least MOD A to maintain sitting balance, and hypotensive when seated. She is currently functioning below perceived baseline functional mobility status. She will benefit from continued skilled PT intervention while admitted to acute hospital, with additional recommendation of continued skilled therapy at SNF for <3hrs/day at discharge for continued functional gains and increased independence. PT will continue to monitor her functional progress and make DME recommendations as appropriate.  BP during PT evaluation (pt reports no symptoms with all mobility tasks): @12 :00, supine: 109/46 @12 :02, seated: 86/53 @12 :08, seated: 79/51 @12 :12, supine: 95/61       If plan is discharge home, recommend the following: Two people to help with walking and/or transfers;Two people to help with bathing/dressing/bathroom;Direct supervision/assist for medications management;Assist for transportation;Help with stairs or ramp for entrance;Supervision due to cognitive status;Direct supervision/assist for financial management    Can travel by private vehicle   No    Equipment Recommendations Other (comment) (will continue to monitor functional progress)  Recommendations for Other Services       Functional Status Assessment Patient has had a recent decline in their functional status and/or demonstrates limited ability to make significant improvements in function in a reasonable and predictable amount of time     Precautions / Restrictions Precautions Precautions: Fall Recall of Precautions/Restrictions: Impaired Precaution/Restrictions Comments: patient with baseline cognitive deficits Restrictions Weight Bearing Restrictions Per Provider Order: No      Mobility  Bed Mobility Overal bed mobility: Needs Assistance Bed Mobility: Supine to Sit, Sit to Supine     Supine to sit: +2 for physical assistance, +2 for safety/equipment, HOB elevated, Mod assist Sit to supine: +2 for physical assistance, +2 for safety/equipment, Max assist   General bed mobility comments: heavy assist to sit up at EOB, difficulty following commands to assist with transfer; right lateral lean once seated due to left hemiparesis    Transfers Overall transfer level:  (unsafe to attempt due to hypotension)                      Ambulation/Gait                  Stairs            Wheelchair Mobility     Tilt Bed    Modified Rankin (Stroke Patients Only)       Balance Overall balance assessment: Needs assistance Sitting-balance support: Single extremity supported, Feet supported (RUE due to left hemi) Sitting balance-Leahy Scale: Poor Sitting balance - Comments: heavy psoterior and right lateral lean, MOD A to maintain sitting balance  Pertinent Vitals/Pain Pain Assessment Pain Assessment: Faces Faces Pain Scale: Hurts a little bit Facial Expression: Relaxed, neutral Body Movements: Absence of movements Muscle Tension: Relaxed Pain  Location: grimacing with mobility tasks Pain Descriptors / Indicators: Grimacing    Home Living Family/patient expects to be discharged to:: Private residence Living Arrangements: Non-relatives/Friends;Children (per admission note, patient's roommate called EMS; unsure if this is a family memeber or friend) Available Help at Discharge: Available 24 hours/day Type of Home: Apartment Home Access: Stairs to enter Entrance Stairs-Rails: Left Entrance Stairs-Number of Steps: 2-3 Alternate Level Stairs-Number of Steps: flight Home Layout: Two level;Able to live on main level with bedroom/bathroom Home Equipment: Shower seat;Rolling Walker (2 wheels);Wheelchair - manual Additional Comments: information gathered from previous admission as patient is questionable historian; when asked, pt reports spending most of her time in bed    Prior Function Prior Level of Function : Needs assist  Cognitive Assist : Mobility (cognitive);ADLs (cognitive) (based on clinical judgement and current presentation)     Physical Assist : Mobility (physical);ADLs (physical) (based on clinical judgement)     Mobility Comments: pt reports spending most of her time in bed, states she has a WC but unable to elaborate on assistance needed to get in/out of bed, transfers, etc       Extremity/Trunk Assessment   Upper Extremity Assessment Upper Extremity Assessment: Defer to OT evaluation    Lower Extremity Assessment Lower Extremity Assessment: LLE deficits/detail;RLE deficits/detail RLE Deficits / Details: decreased strength, grossly 2/5 RLE Sensation: WNL LLE Deficits / Details: L hemi from previous CVA LLE Coordination: decreased gross motor;decreased fine motor       Communication   Communication Communication: Impaired Factors Affecting Communication: Difficulty expressing self    Cognition Arousal: Lethargic Behavior During Therapy: Flat affect, Lability   PT - Cognitive impairments: History of  cognitive impairments, Difficult to assess, Attention, Sequencing, Awareness, Orientation Difficult to assess due to: Level of arousal Orientation impairments: Time, Situation                     Following commands: Impaired Following commands impaired: Follows one step commands inconsistently     Cueing Cueing Techniques: Verbal cues, Gestural cues, Tactile cues     General Comments      Exercises     Assessment/Plan    PT Assessment Patient needs continued PT services  PT Problem List Decreased strength;Decreased coordination;Decreased cognition;Decreased activity tolerance;Decreased balance;Decreased mobility;Decreased safety awareness;Impaired tone       PT Treatment Interventions Balance training;Gait training;Neuromuscular re-education;Functional mobility training;Therapeutic activities;Therapeutic exercise;Wheelchair mobility training;Patient/family education    PT Goals (Current goals can be found in the Care Plan section)  Acute Rehab PT Goals PT Goal Formulation: Patient unable to participate in goal setting    Frequency Min 2X/week     Co-evaluation               AM-PAC PT 6 Clicks Mobility  Outcome Measure Help needed turning from your back to your side while in a flat bed without using bedrails?: A Lot Help needed moving from lying on your back to sitting on the side of a flat bed without using bedrails?: Total Help needed moving to and from a bed to a chair (including a wheelchair)?: Total Help needed standing up from a chair using your arms (e.g., wheelchair or bedside chair)?: Total Help needed to walk in hospital room?: Total Help needed climbing 3-5 steps with a railing? : Total 6 Click Score: 7  End of Session   Activity Tolerance: Patient limited by lethargy;Treatment limited secondary to medical complications (Comment);Other (comment) (hypotension once seated at EOB)   Nurse Communication: Mobility status;Other (comment)  (vitals) PT Visit Diagnosis: Repeated falls (R29.6);Muscle weakness (generalized) (M62.81);Other abnormalities of gait and mobility (R26.89)    Time: 8849-8783 PT Time Calculation (min) (ACUTE ONLY): 26 min   Charges:   PT Evaluation $PT Eval Moderate Complexity: 1 Mod             Effie Wahlert H. Payson Crumby, PT, DPT   [employer] 04/15/2024, 12:49 PM

## 2024-04-15 NOTE — Progress Notes (Addendum)
 NAME:  [name], MRN:  [id number], DOB:  Aug 28, 1945, LOS: 2 ADMISSION DATE:  04/13/2024, CHIEF COMPLAINT:  Altered Mental Status.   Initial History of Presenting Illness:  This 78 year old woman presented to the ER after EMS was called by her roommate as she was not responsive.  She has a history of CVA, heart failure with preserved ejection fraction, DVT not on anticoagulation, hypertension, falls in the past, E. coli UTI.  Patient could not give me any history.  I called the son who apparently has not been in touch with his mother recently.  He recommended me to call the patient's daughter which I did and she could not pick up the phone but son said she is planning on coming to the hospital tomorrow.  Patient apparently complained of chest pain and was very confused.  The roommate of this patient called EMS.  When EMS arrived the roommate was not there.  Apparently nobody was there at home.  Patient was laying in bed not much responsive.  She was underneath a single blanket.  Home is without heat or lights on.  She was very cool to palpitation.  She was initially hypotensive and hypoglycemic.  As she was being rewarmed with the Bair hugger IV fluids and dextrose  were given her blood pressure was significantly improved.  Patient was apparently covered in feces.  She is found to be profoundly dehydrated with dry skin turgor.  The emergency room patient was found to be in acute liver failure with coagulopathy.  Her UA was positive.  Patient is given IV fluids dextrose  and is rewarmed.  She is alert with GCS of 10-12.  She is maintaining her airway.  She is hemodynamically stable not needing any vasopressor therapy.  She is being rewarmed.  BNP 326 Bedside POCUS is attempted EF seems low, no pericardial effusion however patient was not much cooperative given that she was needing rewarming.  Will get a formal echo in the morning.  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  Interim History / Subjective:  No acute events overnight   Patient evaluated bedside this morning.  She has no concerns this morning.  She is alert and oriented x 3.  Objective    Blood pressure 133/60, pulse (!) 59, temperature 97.9 F (36.6 C), resp. rate 13, height 5' 10 (1.778 m), weight 74.5 kg, SpO2 99%.        Intake/Output Summary (Last 24 hours) at 04/15/2024 0651 Last data filed at 04/15/2024 0600 Gross per 24 hour  Intake 2146.72 ml  Output 1000 ml  Net 1146.72 ml   Filed Weights   04/13/24 2023 04/14/24 0100  Weight: 85.1 kg 74.5 kg    Examination: Physical exam: General: Ill-appearing, dried blood in mouth, no acute distress HEENT: Normocephalic, atraumatic Neuro: Alert and oriented x 3, able to follow instructions, left upper extremity with minimal movement, left lower extremity with minimal movement, right upper extremity 5/5 strength, right lower extremity 4/5  strength Chest: Clear to auscultation bilaterally Heart: Regular rate and rhythm, no murmurs,  rubs or gallops Abdomen: Soft, nondistended, normoactive bowel sounds   CBC: Hemoglobin 9.6, platelets 41 AST: 1475 ALT: 1157 Bilirubin 2.0  Renal ultrasound: No acute findings  Abdominal x-ray Nasogastric tube tip projects over the body of the stomach  Resolved problem list   Assessment and Plan   This is a 78 year old female with a past medical history of prior CVA, HFpEF who  presented to the emergency department with altered mental status found to have acute liver injury.  Patient mated for further evaluation and management.  #Acute liver injury Acute hepatitis panel negative.  Tylenol  level negative.  Having GI wean today to evaluate for other etiologies. CK is 232.  Leading differential at this time is  statin induced liver injury.  Will have GI evaluate for other causes.  Fortunately liver enzymes are trending down. - Monitor liver enzymes - GI following, appreciate recommendations - Continue acetylcysteine   #Altered mental status Likely secondary to hepatic encephalopathy in the setting of acute liver injury.  Patient responding well to the lactulose .  Had 1 big bowel movement today.  Will continue on lactulose . - Continue lactulose  with goal bowel movements of 3 bowel movements a day - NG tube pulled out, will have patient get evaluated by speech - If core track is needed, can place  #Shock, resolved Thought to be in the setting of UTI.  On my exam this morning, patient does not seem to have any urinary symptoms.  UA looked clear.  Patient was on Zosyn .  Patient has been weaned off Levophed .  Shock was likely in the setting of acute liver injury. - Discontinue antibiotics - Monitor maps  #Acute Kidney Injury -likely prerenal from sepsis and dehydration #Hypokalemia, resolved #Lactic Acidosis #Severe dehydration, resolving Creatinine down to 1.27.  Improving and patient is making urine.  Will continue to monitor.  While patient evaluate for p.o. intake.  Renal ultrasound negative for any hydronephrosis.  This is likely prerenal in the setting of dehydration and shock. 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 Prior CVA left patient with left sided deficits   - Patient on Crestor  at home 20 mg daily.  Given liver injury, holding crestor .  #COPD Patient has albuterol  at home.  No acute concerns for respiratory compromise during this hospitalization - Monitor respiratory status - PRN Duonebs   #History of DVT Patient not on any blood thinners at home.  No acute concerns for thrombus currently.  #GERD Patient is on pantoprazole  40 mg daily at home.  No acute concerns at this time.  Patient passes speech and can start Protonix  40 mg daily  #Depression Patient is on home mirtazapine 7.5 mg at home.  - No acute concerns at this time  - Resume once patient is able to take PO intake    Will attempt to call patient family today to get more collateral   Labs   CBC: Recent Labs  Lab 04/13/24 2123 04/13/24 2132 04/14/24 0957 04/15/24 0248  WBC 6.8  --  5.1 5.7  NEUTROABS 6.2  --   --   --   HGB 11.9* 12.6  13.3 9.5* 9.6*  HCT 36.4 37.0  39.0 28.8* 28.9*  MCV 90.5  --  90.9 89.8  PLT 56*  --  52*  41* 43*  41*    Basic Metabolic Panel: Recent Labs  Lab 04/13/24 2123 04/13/24 2132 04/13/24 2308 04/14/24 0957 04/14/24 1958 04/15/24 0248  NA 145 146*  147* 144 146* 143 145  K 2.6* 2.5*  2.5* 2.7* 2.7* 3.5 4.9  CL 106 106 108 110 110 114*  CO2 20*  --  19* 18* 20* 21*  GLUCOSE 122* 120* 81 88 167* 147*  BUN 46* 45* 45* 42* 40* 38*  CREATININE 2.38* 2.20* 1.96* 1.59* 1.43* 1.27*  CALCIUM  8.6*  --  7.5* 7.4* 7.4* 7.4*  MG  --   --   --  1.7  --  1.7   GFR: Estimated Creatinine Clearance: 38.2 mL/min (A) (by C-G formula based on SCr of 1.27 mg/dL (H)). Recent Labs  Lab 04/13/24 2123 04/13/24 2132 04/13/24 2314 04/14/24 0957 04/14/24 1958 04/15/24 0248  WBC 6.8  --   --  5.1  --  5.7  LATICACIDVEN  --   8.8* 7.1* 4.9* 3.2*  --     Liver Function Tests: Recent Labs  Lab 04/13/24 2123 04/13/24 2308 04/14/24 0957 04/14/24 1958 04/15/24 0248  AST 4,750* 3,489* 2,619* 1,795* 1,475*  ALT 2,226* 1,723* 1,437* 1,236* 1,157*  ALKPHOS 102 85 82 82 90  BILITOT 2.6* 2.4* 2.2* 2.0* 2.0*  PROT 7.0 5.3* 5.4* 5.3* 5.5*  ALBUMIN  2.7* 2.1* 1.9* 1.9* 1.8*   Recent Labs  Lab 04/13/24 2123  LIPASE 106*   Recent Labs  Lab 04/14/24 0005 04/14/24 0957  AMMONIA 50* 60*    ABG    Component Value Date/Time   HCO3 21.5 04/13/2024 2132   TCO2 20 (L) 04/13/2024 2132   TCO2 23 04/13/2024 2132   ACIDBASEDEF 4.0 (H) 04/13/2024 2132   O2SAT 56 04/13/2024 2132     Coagulation Profile: Recent Labs  Lab 04/13/24 2123 04/14/24 0957 04/15/24 0248  INR 2.3* 2.6* 1.9*    Cardiac Enzymes: No results for input(s): CKTOTAL, CKMB, CKMBINDEX, TROPONINI in the last 168 hours.  HbA1C: Hgb A1c MFr Bld  Date/Time Value Ref Range Status  09/11/2020 05:03 PM 5.3 4.8 - 5.6 % Final    Comment:    (NOTE) Pre diabetes:          5.7%-6.4%  Diabetes:              >6.4%  Glycemic control for   <7.0% adults with diabetes     CBG: Recent Labs  Lab 04/14/24 1116 04/14/24 1522 04/14/24 1946 04/14/24 2319 04/15/24 0331  GLUCAP 83 103* 161* 145* 148*    Review of Systems:   Review of systems could not be done as the patient is confused.  Past Medical History:  She,  has a past medical history of 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 [facility]) (09/16/2020), COPD (chronic obstructive pulmonary disease) (HCC), History of DVT (deep vein thrombosis), Hypertension, Thoracoabdominal aortic aneurysm, and TIA (transient ischemic attack) (09/10/2020).   Surgical History:   Past Surgical History:  Procedure Laterality Date   IR BONE MARROW BIOPSY & ASPIRATION  12/13/2023   IR CT HEAD LTD  09/11/2020   IR INTRAVSC STENT CERV CAROTID W/O EMB-PROT MOD  SED INC ANGIO  09/11/2020  IR PERCUTANEOUS ART THROMBECTOMY/INFUSION INTRACRANIAL INC DIAG ANGIO  09/11/2020       IR PERCUTANEOUS ART THROMBECTOMY/INFUSION INTRACRANIAL INC DIAG ANGIO  09/11/2020   IR US  GUIDE VASC ACCESS RIGHT  09/11/2020   NO PAST SURGERIES     RADIOLOGY WITH ANESTHESIA N/A 09/11/2020   Procedure: IR WITH ANESTHESIA;  Surgeon: Dolphus Carrion, MD;  Location: [facility] OR;  Service: Radiology;  Laterality: N/A;     Social History:   reports that she quit smoking about 36 years ago. Her smoking use included cigarettes. She has never used smokeless tobacco. She reports that she does not currently use alcohol. She reports that she does not use drugs.   Family History:  Her family history includes Hypertension in her mother.   Allergies Allergies  Allergen Reactions   Iodinated Contrast Media Itching and Other (See Comments)    9/1 developed diffuse itching after contrast dye for CT (without rash or airway involvement or GI symptoms) . Consider pretreatment before IV contrast in the future      Home Medications  Prior to Admission medications   Medication Sig Start Date End Date Taking? Authorizing Provider  albuterol  (VENTOLIN  HFA) 108 (90 Base) MCG/ACT inhaler Inhale 2 puffs into the lungs every 6 (six) hours as needed for wheezing or shortness of breath. 01/09/24   Darci Pore, MD  amLODipine  (NORVASC ) 10 MG tablet Take 1 tablet (10 mg total) by mouth daily. 12/15/23   Rojelio Nest, DO  furosemide  (LASIX ) 40 MG tablet Take 1 tablet (40 mg total) by mouth daily. 01/19/24   Patsy Lenis, MD  hydrALAZINE  (APRESOLINE ) 50 MG tablet Take 1 tablet (50 mg total) by mouth every 8 (eight) hours. 12/15/23   Rojelio Nest, DO  hydrOXYzine  (ATARAX ) 25 MG tablet Take 1 tablet (25 mg total) by mouth 3 (three) times daily as needed for anxiety. 01/19/24   Patsy Lenis, MD  pantoprazole  (PROTONIX ) 40 MG tablet Take 1 tablet (40 mg total) by mouth daily. 12/15/23   Rojelio Nest, DO   polyethylene glycol (MIRALAX  / GLYCOLAX ) 17 g packet Take 17 g by mouth daily. 12/15/23   Rojelio Nest, DO  rosuvastatin  (CRESTOR ) 20 MG tablet TAKE 1 TABLET(20 MG) BY MOUTH DAILY Patient taking differently: Take 20 mg by mouth daily. 08/23/23   Jaycee Greig PARAS, NP     Critical care time:     Libby Blanch, DO  Internal Medicine Resident PGY-3

## 2024-04-15 NOTE — Progress Notes (Signed)
 eLink Physician-Brief Progress Note Patient Name: [name] DOB: 1946/03/10 MRN: [id number]   Date of Service  04/15/2024  HPI/Events of Note  Notified of lactate 4.5.  Current IVF D10 @40  I/O indicate pt to be net postiive 7.5 Liters since admission.  Albumin  1.8  eICU Interventions  Lactate remains elevated.  May in part be due to decreased hepatic clearance.  Will give volume in the form of albumin .  Will continue to monitor VS, serial lactate.          Kizzy Olafson M DELA CRUZ 04/15/2024, 10:22 PM

## 2024-04-15 NOTE — Evaluation (Signed)
 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 (HCC) 09/16/2020   COPD (chronic obstructive pulmonary disease) (HCC)    History of DVT (deep vein thrombosis)    Hypertension    Thoracoabdominal aortic aneurysm    TIA (transient ischemic attack) 09/10/2020   Past Surgical History:  Past Surgical History:  Procedure Laterality Date   IR BONE MARROW BIOPSY & ASPIRATION  12/13/2023   IR CT HEAD LTD  09/11/2020   IR INTRAVSC STENT CERV CAROTID W/O EMB-PROT MOD SED INC ANGIO  09/11/2020   IR PERCUTANEOUS ART THROMBECTOMY/INFUSION INTRACRANIAL INC DIAG ANGIO  09/11/2020       IR PERCUTANEOUS ART THROMBECTOMY/INFUSION INTRACRANIAL INC DIAG ANGIO  09/11/2020   IR US  GUIDE VASC ACCESS RIGHT  09/11/2020   NO PAST SURGERIES     RADIOLOGY WITH ANESTHESIA N/A 09/11/2020   Procedure: IR WITH ANESTHESIA;  Surgeon: Dolphus Carrion, MD;  Location: [facility] OR;  Service: Radiology;  Laterality: N/A;   HPI:  [name] is a 78 y/o female presenting 04/14/24 after roommate called EMS as the patient was unresponsive. She reports chest pain and was very confused. Pt found under single blanket, alone with no heat or lights on in home.   PMHx:  HTN, HLD, CVA left hemiparesis, CAPD, DVT, IVH. UA positive. SLP consulted for clinical swallow assessment.    Assessment / Plan / Recommendation  Clinical Impression   Pt presents with a mild oral dysphagia and concerns for a pharyngeal dysphagia per clinical swallow assessment completed today. Recommend continue NPO with the exception of ice chips and meds crushed in applesauce. MBSS anticipated for later this afternoon.    Oral deficits characterized by generalized reduced labial weakness with anterior loss of liquids by cup from L>R side. Improved labial seal observed with straw sips. Intermittent oral holding of ice chips, liquids, and applesauce observed. Pt benefited from verbal cues to initiate a more timely swallow. Pharyngeal swallow initiation observed with laryngeal elevation. Observed immediate and delayed coughing following >80% of thin liquid trials by cup and straw. There was x1 instance of O2 desat to 88% and watery eyes with coughing episode, concerning for airway violation.   MBSS recommended for further objective assessment prior to diet advancement. SLP to follow.    SLP Visit Diagnosis: Dysphagia, unspecified (R13.10)    Aspiration Risk  Mild aspiration risk    Diet Recommendation Ice chips PRN after oral care;NPO except meds    Medication Administration: Crushed with puree Supervision: Full supervision/cueing for compensatory strategies Postural Changes: Seated upright at 90 degrees    Other Recommendations Oral Care Recommendations: Oral care BID     Swallow Evaluation Recommendations  MBSS pending   Assistance Recommended at Discharge  TBD  Functional Status Assessment Patient has had a recent decline in their functional status and demonstrates the ability to make significant improvements in function in a reasonable and predictable amount of time.  Frequency and Duration min 2x/week  2 weeks       Prognosis Prognosis for improved oropharyngeal function: Fair      Swallow Study   General Date of Onset: 04/14/24 HPI: [name] is a 78 y/o female presenting 04/14/24 after  roommate called EMS as the patient was unresponsive. She reports chest pain and was very confused. Pt found under single blanket, alone with no heat or lights on in home.   PMHx:  HTN, HLD, CVA left hemiparesis, CAPD, DVT, IVH. UA positive. SLP consulted for clinical swallow assessment. Type of Study: Bedside  Swallow Evaluation Previous Swallow Assessment: clinical swallow assessment 01/08/24 with recommendations of a D3 diet and thin liquids Diet Prior to this Study: NPO Temperature Spikes Noted: No Respiratory Status: Room air History of Recent Intubation: No Behavior/Cognition: Alert;Cooperative Oral Cavity Assessment: Dried secretions Oral Care Completed by SLP: Recent completion by staff Oral Cavity - Dentition: Poor condition;Missing dentition Self-Feeding Abilities: Total assist (limited assessment) Patient Positioning: Upright in bed Baseline Vocal Quality: Low vocal intensity Volitional Cough: Cognitively unable to elicit Volitional Swallow: Unable to elicit    Oral/Motor/Sensory Function Overall Oral Motor/Sensory Function: Generalized oral weakness (pt unable to participate in formal OME)   Ice Chips Ice chips: Impaired Oral Phase Impairments: Impaired mastication;Poor awareness of bolus Oral Phase Functional Implications:  (delayed transit)   Thin Liquid Thin Liquid: Impaired Oral Phase Impairments: Reduced labial seal Oral Phase Functional Implications: Left anterior spillage Pharyngeal  Phase Impairments: Cough - Immediate;Cough - Delayed Other Comments: cough with cup and straw sips    Nectar Thick Nectar Thick Liquid: Not tested   Honey Thick Honey Thick Liquid: Not tested   Puree Puree: Impaired Oral Phase Functional Implications:  (delayed oral transit)   Solid     Solid: Not tested      [name] 04/15/2024,1:09 PM

## 2024-04-15 NOTE — Progress Notes (Signed)
  Echocardiogram 2D Echocardiogram has been performed.  Devora Ellouise SAUNDERS 04/15/2024, 2:43 PM

## 2024-04-15 NOTE — Consult Note (Signed)
 Consultation Note   Referring Provider:   PCCM PCP: Stephen Rojelio PARAS, NP Primary Gastroenterologist: [facility]        Reason for Consultation:  ? Acute liver failure DOA: 04/13/2024         Hospital Day: 3   ASSESSMENT    78 yo female admitted with altered mental status, shock, hypothermia, possible UTI, concern for acute liver failure.  Patient is now off pressors.  Empiric antibiotics have been DC'd  Elevated liver chemistries ( predominantly hepatocellular pattern) , coagulopathy, altered mental status.  Patient is alert and oriented .  LFTs are improving .  INR improving  (received vitamin K  ).  Etiology not yet clear .  Workup thus far :  Acute viral hepatitis panel negative but could be still be viral ( CMV, EBV). CK not markedly elevated, shock liver possible but not at top of DDx list. DILI is possible, she was taking several acetaminophen  a day.  Acetaminophen  level was negative but results will depend on when she last took it.  Was also taking a statin.  No abdominal pain but need to rule out a thrombotic event.   Self-limited /small-volume episode of coffee-ground emesis Resolved.  Hemoglobin is around baseline  Chronic thrombocytopenia ?  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  Patient pulled out NG tube.  Speech therapy to evaluate safety for p.o..  Will take p.o. lactulose  if able.   -- Continue twice daily pantoprazole  -- Continue to trend LFTs, INR -- Avoid hepatotoxic medications -- Will obtain autoimmune markers of chronic liver disease including ANA, IgG and anti-smooth muscle antibody -- CMV studies added.   HPI   Brief History:   Patient is an 78 yo female admitted after being found unresponsive . She was hypotensive, hypothermic.She was admitted with septic shock, UTI, and acute liver failure.  No acute findings on non contrast CT AP or head CT or RUQ US .    Admission labs notable for : Normal WBC Hgb 11.9 Platelets 56  K+ 2.6 Cr 2.38 Lipase 106 AST 4750 ALT 2226 Tbili 2.6 Alk phos 102 Trop 316 Lactic acid 8.8 INR 2.3, ammonia 50   Interval History Started on Vasopressors, Bair hugger, Acetadote , IV antibiotics, Vit K, lactulose .  INR has improved. Liver enzymes improving. She is alert and oriented to place and time. History taking is difficult, very hard of hearing.  I used pen / paper. Patient had been taking Tylenol  prior to admission, possibly 4 pills 4 times  / day for left hip pain. No abdominal pain.  Per RN, she had a small volume of hematemesis at some point in was given octreotide  (discontinued).  Patient has no history of alcohol use.  Pertinent GI Studies   Pertinent studies:   Acetaminophen  < 10 Acute hepatitis panel negative   Recent Labs    04/14/24 0957 04/14/24 1958 04/15/24 0248  PROT 5.4* 5.3* 5.5*  ALBUMIN  1.9* 1.9* 1.8*  AST 2,619* 1,795* 1,475*  ALT 1,437* 1,236* 1,157*  ALKPHOS 82 82 90  BILITOT 2.2* 2.0* 2.0*  BILIDIR 1.0* 1.0*  --   IBILI 1.2* 1.0*  --    Recent Labs    04/13/24 2123 04/13/24 2132 04/14/24 0957 04/15/24 0248  WBC 6.8  --  5.1 5.7  HGB 11.9* 12.6  13.3 9.5* 9.6*  HCT 36.4 37.0  39.0 28.8* 28.9*  MCV 90.5  --  90.9 89.8  PLT 56*  --  52*  41* 43*  41*   Recent Labs    04/14/24 0957 04/14/24 1958 04/15/24 0248  NA 146* 143 145  K 2.7* 3.5 4.9  CL 110 110 114*  CO2 18*  20* 21*  GLUCOSE 88 167* 147*  BUN 42* 40* 38*  CREATININE 1.59* 1.43* 1.27*  CALCIUM  7.4* 7.4* 7.4*     DG Abd Portable 1V EXAM: 1 VIEW XRAY OF THE ABDOMEN 04/14/2024 06:02:00 PM  COMPARISON: Same day knees.  CLINICAL HISTORY: Confusion.  FINDINGS:  LINES, TUBES AND DEVICES: Nasogastric tube tip has been advanced into body of stomach and is in grossly good position.  BOWEL: Nonobstructive bowel gas pattern.  SOFT TISSUES: No abnormal calcifications.  BONES: No acute fracture.  IMPRESSION: 1. Nasogastric tube tip projects over the body of the stomach in expected position.  Electronically signed by: Lynwood Seip MD 04/14/2024 06:13 PM EST RP Workstation: [id number] DG Abd 1 View EXAM: 1 VIEW XRAY OF THE ABDOMEN 04/14/2024 12:00:00 PM  COMPARISON: None available.  CLINICAL HISTORY: Nasogastric tube present.  FINDINGS:  LINES, TUBES AND DEVICES: Enteric tube in place with tip projecting over the stomach. Side port projecting over the distal esophagus.  BOWEL: Nonobstructive bowel gas pattern.  SOFT TISSUES: Atherosclerotic calcifications and abdominal aortic aneurysm.  BONES: No acute fracture.  IMPRESSION: 1. Enteric tube in place with tip projecting over the stomach and side port projecting over the distal esophagus. Recommend advancing the tube so the side port is beyond the gastroesophageal junction. 2. Abdominal aortic aneurysm with atherosclerotic calcifications. This is described in greater detail on CT of the abdomen and pelvis without contrast 04/13/2024 .  Electronically signed by: Lonni Necessary MD 04/14/2024 04:27 PM EST RP Workstation: [id number] US  RENAL EXAM: US  Retroperitoneum Complete, Renal. 04/14/2024 12:20:00 PM  TECHNIQUE: Real-time ultrasonography of the retroperitoneum renal was performed.  COMPARISON: None available  CLINICAL HISTORY: Acute kidney failure.  FINDINGS:  FINDINGS: RIGHT KIDNEY/URETER:  Right kidney measures 10.5 x 5.5 x 5.0 cm. Normal cortical echogenicity. No hydronephrosis. No calculus. No mass.  LEFT KIDNEY/URETER: Left kidney measures 10.5 x 5.3 x 4.9 cm. Normal cortical echogenicity. No hydronephrosis. No calculus. No mass.  BLADDER: Urinary bladder is decompressed secondary to foley catheter.  IMPRESSION: 1. No acute findings.  Electronically signed by: Lynwood Seip MD 04/14/2024 12:50 PM EST RP Workstation: [id number] US  Abdomen Limited RUQ (LIVER/GB) EXAM: Right Upper Quadrant Abdominal Ultrasound  TECHNIQUE: Real-time ultrasonography of the right upper quadrant of the abdomen was performed.  COMPARISON: None available.  CLINICAL HISTORY: LFT elevation.  FINDINGS:  LIVER: Increased hepatic steatosis. No intrahepatic biliary ductal dilatation. No mass.  BILIARY SYSTEM: No evidence of pericholecystic fluid or wall thickening. No cholelithiasis. Common bile duct is within normal limits measuring 2.3 mm.  RIGHT KIDNEY: The right kidney is grossly unremarkable in appearances without evidence of hydronephrosis, echogenic calculi or worrisome mass lesions.  OTHER: No right upper quadrant ascites.  IMPRESSION: 1. Hepatic steatosis.  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 (HCC) 09/16/2020   COPD (chronic obstructive pulmonary disease) (HCC)    History of DVT (deep vein thrombosis)    Hypertension    Thoracoabdominal aortic aneurysm    TIA (transient ischemic attack) 09/10/2020    Past Surgical History:  Procedure Laterality Date   IR BONE MARROW BIOPSY & ASPIRATION  12/13/2023   IR CT HEAD LTD  09/11/2020   IR INTRAVSC STENT CERV CAROTID W/O EMB-PROT MOD SED INC ANGIO  09/11/2020   IR PERCUTANEOUS ART THROMBECTOMY/INFUSION INTRACRANIAL INC DIAG ANGIO   09/11/2020       IR PERCUTANEOUS ART THROMBECTOMY/INFUSION INTRACRANIAL INC DIAG ANGIO  09/11/2020   IR US  GUIDE VASC ACCESS RIGHT  09/11/2020   NO PAST SURGERIES     RADIOLOGY WITH ANESTHESIA N/A 09/11/2020   Procedure: IR WITH ANESTHESIA;  Surgeon: Dolphus Carrion, MD;  Location: [facility] OR;  Service: Radiology;  Laterality: N/A;    Family History  Problem Relation Age of Onset   Hypertension Mother     Prior to Admission medications   Medication Sig Start Date End Date Taking? Authorizing Provider  albuterol  (VENTOLIN  HFA) 108 (90 Base) MCG/ACT inhaler Inhale 2 puffs into the lungs every 6 (six) hours as needed for wheezing or shortness of breath. 01/09/24   Darci Pore, MD  amLODipine  (NORVASC ) 10 MG tablet Take 1 tablet (10 mg total) by mouth daily. 12/15/23   Rojelio Nest, DO  furosemide  (LASIX ) 40 MG tablet Take 1 tablet (40 mg total) by mouth daily. 01/19/24   Patsy Lenis, MD  hydrALAZINE  (APRESOLINE ) 50 MG tablet Take 1 tablet (50 mg total) by mouth every 8 (eight) hours. 12/15/23   Rojelio Nest, DO  hydrOXYzine  (ATARAX ) 25 MG tablet Take 1 tablet (25 mg total) by mouth 3 (three) times daily as needed for anxiety. 01/19/24   Patsy Lenis, MD  mirtazapine (REMERON) 7.5 MG tablet Take 7.5 mg by mouth at bedtime.    [provider]  pantoprazole  (PROTONIX ) 40 MG tablet Take 1 tablet (40 mg total) by mouth daily. 12/15/23   Rojelio Nest, DO  polyethylene glycol (MIRALAX  / GLYCOLAX ) 17 g packet Take 17 g by mouth daily. 12/15/23   Rojelio Nest, DO  rosuvastatin  (CRESTOR ) 20 MG tablet TAKE 1 TABLET(20 MG) BY MOUTH DAILY Patient taking differently: Take 20 mg by mouth daily. 08/23/23   Jaycee Greig PARAS, NP    Current Facility-Administered Medications  Medication Dose Route Frequency Provider Last Rate Last Admin   acetylcysteine  (ACETADOTE ) 18,000 mg in dextrose  5 % 590 mL (30.5085 mg/mL) infusion  6.25 mg/kg/hr Intravenous Continuous Mattie Marvetta SQUIBB, RPH 15.26 mL/hr at 04/15/24  1100 6.25 mg/kg/hr at 04/15/24 1100   Chlorhexidine  Gluconate Cloth 2 % PADS 6 each  6 each Topical Daily Gatha Pence, MD   6 each at 04/15/24 1015   dextrose  10 % infusion   Intravenous Continuous Baral, Dipti, MD 40 mL/hr at 04/15/24 1100 Infusion Verify at 04/15/24 1100   docusate (COLACE) 50 MG/5ML liquid 100 mg  100 mg Per Tube BID PRN Pham, Minh Q, RPH-CPP       lactulose  (CHRONULAC ) 10 GM/15ML  solution 20 g  20 g Per Tube TID Pham, Minh Q, RPH-CPP   20 g at 04/14/24 2109   norepinephrine  (LEVOPHED ) 4mg  in (0.016 mg/mL) premix infusion  0-10 mcg/min Intravenous Titrated Haze Led, MD   Stopped at 04/15/24 0355   ondansetron  (ZOFRAN ) injection 4 mg  4 mg Intravenous Q6H PRN Haze Led, MD       Oral care mouth rinse  15 mL Mouth Rinse PRN Gleason, Leita SAUNDERS, PA-C       pantoprazole  (PROTONIX ) injection 40 mg  40 mg Intravenous Q12H Paliwal, Aditya, MD   40 mg at 04/15/24 1022   phytonadione  (VITAMIN K ) 10 mg in dextrose  5 % 50 mL IVPB  10 mg Intravenous Daily Tobie Gaines, DO 51 mL/hr at 04/15/24 1100 Infusion Verify at 04/15/24 1100   polyethylene glycol (MIRALAX  / GLYCOLAX ) packet 17 g  17 g Per Tube Daily PRN Pham, Minh Q, RPH-CPP        Allergies as of 04/13/2024 - Reviewed 04/13/2024  Allergen Reaction Noted   Iodinated contrast media Itching and Other (See Comments) 01/08/2024    Social History   Socioeconomic History   Marital status: Single    Spouse name: Not on file   Number of children: Not on file   Years of education: Not on file   Highest education level: Not on file  Occupational History   Not on file  Tobacco Use   Smoking status: Former    Current packs/day: 0.00    Types: Cigarettes    Quit date: 05/10/1987    Years since quitting: 36.9   Smokeless tobacco: Never  Vaping Use   Vaping status: Never Used  Substance and Sexual Activity   Alcohol use: Not Currently   Drug use: Never   Sexual activity: Not Currently    Birth  control/protection: None  Other Topics Concern   Not on file  Social History Narrative   Not on file   Social Drivers of Health   Financial Resource Strain: Not on file  Food Insecurity: Food Insecurity Present (04/15/2024)   Hunger Vital Sign    Worried About Programme Researcher, Broadcasting/film/video in the Last Year: Often true    Ran Out of Food in the Last Year: Often true  Transportation Needs: Unmet Transportation Needs (04/15/2024)   PRAPARE - Administrator, Civil Service (Medical): Yes    Lack of Transportation (Non-Medical): Yes  Physical Activity: Not on file  Stress: Not on file  Social Connections: Unknown (04/15/2024)   Social Connection and Isolation Panel    Frequency of Communication with Friends and Family: Patient unable to answer    Frequency of Social Gatherings with Friends and Family: Patient unable to answer    Attends Religious Services: Patient unable to answer    Active Member of Clubs or Organizations: Patient unable to answer    Attends Banker Meetings: Patient unable to answer    Marital Status: Not on file  Intimate Partner Violence: Patient Unable To Answer (04/15/2024)   Humiliation, Afraid, Rape, and Kick questionnaire    Fear of Current or Ex-Partner: Patient unable to answer    Emotionally Abused: Patient unable to answer    Physically Abused: Patient unable to answer    Sexually Abused: Patient unable to answer     Code Status   Code Status: Full Code  Review of Systems: All systems reviewed and negative except where noted in HPI.  Physical Exam: Vital signs  in last 24 hours: Temp:  [96.1 F (35.6 C)-99 F (37.2 C)] 97.2 F (36.2 C) (12/08 1100) Pulse Rate:  [39-74] 60 (12/08 1100) Resp:  [11-24] 13 (12/08 1100) BP: (98-145)/(35-111) 118/65 (12/08 1100) SpO2:  [88 %-100 %] 98 % (12/08 1100) Last BM Date :  (PTA)  General:  Pleasant female in NAD Psych:  Cooperative. Normal mood and affect Eyes: Pupils equal Ears:  HARD OF  HEARING Nose: No deformity, discharge or lesions Neck:  Supple, no masses felt Lungs:  Clear to auscultation.  Heart:  Regular rate, regular rhythm.  Abdomen:  Soft, nondistended, nontender, active bowel sounds, no masses felt.  No hepatomegaly Rectal :  Deferred Msk: Symmetrical without gross deformities.  Neurologic:  Alert, oriented to place and time. Hard of hearing.  Extremities : No edema.  No asterixis Skin:  Intact without significant lesions.    Intake/Output from previous day: 12/07 0701 - 12/08 0700 In: 1943.7 [I.V.:1362.3; IV Piggyback:581.4] Out: 1000 [Urine:1000] Intake/Output this shift:  Total I/O In: 200.5 [I.V.:116.2; IV Piggyback:84.3] Out: 250 [Urine:250]   Vina Dasen, NP-C   04/15/2024, 11:45 AM

## 2024-04-15 NOTE — Plan of Care (Signed)

## 2024-04-16 DIAGNOSIS — I330 Acute and subacute infective endocarditis: Secondary | ICD-10-CM

## 2024-04-16 LAB — GLUCOSE, CAPILLARY
Glucose-Capillary: 123 mg/dL — ABNORMAL HIGH (ref 70–99)
Glucose-Capillary: 123 mg/dL — ABNORMAL HIGH (ref 70–99)

## 2024-04-16 LAB — COMPREHENSIVE METABOLIC PANEL WITH GFR
ALT: 734 U/L — ABNORMAL HIGH (ref 0–44)
AST: 548 U/L — ABNORMAL HIGH (ref 15–41)
Albumin: 2.3 g/dL — ABNORMAL LOW (ref 3.5–5.0)
Alkaline Phosphatase: 93 U/L (ref 38–126)
Anion gap: 16 — ABNORMAL HIGH (ref 5–15)
BUN: 23 mg/dL (ref 8–23)
CO2: 17 mmol/L — ABNORMAL LOW (ref 22–32)
Calcium: 8 mg/dL — ABNORMAL LOW (ref 8.9–10.3)
Chloride: 112 mmol/L — ABNORMAL HIGH (ref 98–111)
Creatinine, Ser: 0.8 mg/dL (ref 0.44–1.00)
GFR, Estimated: >60 mL/min (ref >60)
Glucose, Bld: 131 mg/dL — ABNORMAL HIGH (ref 70–99)
Potassium: 2.8 mmol/L — ABNORMAL LOW (ref 3.5–5.1)
Sodium: 145 mmol/L (ref 135–145)
Total Bilirubin: 1.8 mg/dL — ABNORMAL HIGH (ref 0.0–1.2)
Total Protein: 6 g/dL — ABNORMAL LOW (ref 6.5–8.1)

## 2024-04-16 LAB — CMV IGM: CMV IgM: 67.8 [AU]/ml — ABNORMAL HIGH (ref 0.0–29.9)

## 2024-04-16 LAB — PROTIME-INR
INR: 1.5 — ABNORMAL HIGH (ref 0.8–1.2)
Prothrombin Time: 19.2 s — ABNORMAL HIGH (ref 11.4–15.2)

## 2024-04-16 LAB — IGG: IgG (Immunoglobin G), Serum: 1675 mg/dL — ABNORMAL HIGH (ref 586–1602)

## 2024-04-16 LAB — CBC
HCT: 27.1 % — ABNORMAL LOW (ref 36.0–46.0)
Hemoglobin: 9.4 g/dL — ABNORMAL LOW (ref 12.0–15.0)
MCH: 30.6 pg (ref 26.0–34.0)
MCHC: 34.7 g/dL (ref 30.0–36.0)
MCV: 88.3 fL (ref 80.0–100.0)
Platelets: 34 K/uL — ABNORMAL LOW (ref 150–400)
RBC: 3.07 MIL/uL — ABNORMAL LOW (ref 3.87–5.11)
RDW: 15.7 % — ABNORMAL HIGH (ref 11.5–15.5)
WBC: 4 K/uL (ref 4.0–10.5)
nRBC: 1 % — ABNORMAL HIGH (ref 0.0–0.2)

## 2024-04-16 LAB — ANA: Anti Nuclear Antibody (ANA): POSITIVE — AB

## 2024-04-16 LAB — CYTOMEGALOVIRUS DNA, QUANTITATIVE REAL-TIME PCR, PLASMA
CMV DNA Quant: NEGATIVE [IU]/mL
Log10 CMV Qn DNA Pl: UNDETERMINED {Log_IU}/mL

## 2024-04-16 LAB — ANTI-SMOOTH MUSCLE ANTIBODY, IGG: F-Actin IgG: 12 U (ref 0–19)

## 2024-04-16 LAB — MAGNESIUM: Magnesium: 1.8 mg/dL (ref 1.7–2.4)

## 2024-04-16 MED ORDER — POTASSIUM CHLORIDE 10 MEQ/100ML IV SOLN
10.0000 meq | INTRAVENOUS | Status: AC
  Administered 2024-04-16 (×4): 10 meq via INTRAVENOUS
  Filled 2024-04-16 (×4): qty 100

## 2024-04-16 MED ORDER — DAPTOMYCIN-SODIUM CHLORIDE 700-0.9 MG/100ML-% IV SOLN
700.0000 mg | Freq: Every day | INTRAVENOUS | Status: DC
  Administered 2024-04-16 – 2024-05-06 (×21): 700 mg via INTRAVENOUS
  Filled 2024-04-16 (×23): qty 100

## 2024-04-16 MED ORDER — SODIUM CHLORIDE 0.9 % IV SOLN
2.0000 g | Freq: Every day | INTRAVENOUS | Status: DC
  Administered 2024-04-16 – 2024-05-06 (×21): 2 g via INTRAVENOUS
  Filled 2024-04-16 (×20): qty 20

## 2024-04-16 MED ORDER — MAGNESIUM SULFATE 2 GM/50ML IV SOLN
2.0000 g | Freq: Once | INTRAVENOUS | Status: AC
  Administered 2024-04-16: 2 g via INTRAVENOUS
  Filled 2024-04-16: qty 50

## 2024-04-16 MED ORDER — ENSURE PLUS HIGH PROTEIN PO LIQD
237.0000 mL | Freq: Two times a day (BID) | ORAL | Status: DC
  Administered 2024-04-16 – 2024-04-17 (×4): 237 mL via ORAL
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
## 2024-04-16 NOTE — Progress Notes (Signed)
 Pharmacy Antibiotic Note  [name] is a 78 y.o. female admitted on 04/13/2024 with concern for AV vegetation vs thrombus.  Pharmacy has been consulted for Daptomycin  + Rocephin  dosing.  CK 232 on 12/8, CrCl>30 ml/min.   Plan: - Start Daptomycin  700 mg IV every 24 hours - Start Rocephin  2g IV every 24 hours - Repeat blood cultures drawn 12/9, weekly CK - Will monitor renal function, additional work-up, and plans for LOT  Height: 5' 10 (177.8 cm) Weight: 74.5 kg (164 lb 3.9 oz) IBW/kg (Calculated) : 68.5  Temp (24hrs), Avg:97.8 F (36.6 C), Min:97.6 F (36.4 C), Max:98.4 F (36.9 C)  Recent Labs  Lab 04/13/24 2123 04/13/24 2132 04/13/24 2308 04/13/24 2314 04/14/24 0957 04/14/24 1958 04/15/24 0248 04/15/24 0810 04/15/24 0955 04/15/24 1337 04/15/24 1538 04/16/24 0345  WBC 6.8  --   --   --  5.1  --  5.7  --   --   --   --  4.0  CREATININE 2.38*   < > 1.96*  --  1.59* 1.43* 1.27*  --   --   --   --  0.80  LATICACIDVEN  --    < >  --    < > 4.9* 3.2*  --  3.7* 3.8* 3.9* 4.5*  --    < > = values in this interval not displayed.    Estimated Creatinine Clearance: 60.7 mL/min (by C-G formula based on SCr of 0.8 mg/dL).    Allergies  Allergen Reactions   Iodinated Contrast Media Itching and Other (See Comments)    9/1 developed diffuse itching after contrast dye for CT (without rash or airway involvement or GI symptoms) . Consider pretreatment before IV contrast in the future     Antimicrobials this admission: Vancomycin  12/6 x 1 Flagyl  12/6 x 1 Cefepime  12/6 x 1 Zosyn  12/7 >> 12/8 Rocephin  12/9 >> Daptomycin  12/9 >>  Dose adjustments this admission:   Microbiology results: 12/6 COVD/flu/RSV >> neg 12/6 MRSA PCR >> neg 12/6 BCx >> ngx3d 12/9 BCx >>   Thank you for allowing pharmacy to be a part of this patient's care.  Almarie Lunger, PharmD, BCPS, BCIDP Infectious Diseases Clinical Pharmacist 04/16/2024 2:06 PM   **Pharmacist phone directory can now be  found on [email or web address] ([email or web address]).  Listed under [facility].
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
## 2024-04-16 NOTE — Care Management Important Message (Signed)
 Important Message  Patient Details  Name: [name] MRN: [id number] Date of Birth: Aug 14, 1945   Important Message Given:  Yes - Medicare IM     Claretta Deed 04/16/2024, 4:17 PM
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
## 2024-04-16 NOTE — NC FL2 (Signed)
 [place]  MEDICAID FL2 LEVEL OF CARE FORM     IDENTIFICATION  Patient Name: [name] Birthdate: [birth year] Sex: female Admission Date (Current Location): 04/13/2024  [facility] [place] Number:  Producer, Television/film/video and Address:  [facility], [address], [place], [place] [address]      Provider Number: [id number]  Attending Physician Name and Address:  Raenelle Donalda HERO, MD  Relative Name and Phone Number:  rosabel Norcross (Daughter)  [phone] (Mobile)    Current Level of Care: Hospital Recommended Level of Care: Skilled Nursing Facility Prior Approval Number:    Date Approved/Denied:   PASRR Number: [id number]  Discharge Plan: SNF    Current Diagnoses: Patient Active Problem List   Diagnosis Date Noted   Transaminitis 04/15/2024   Acute liver failure 04/13/2024   Evaluation by psychiatric service required 01/21/2024   Acute on chronic heart failure with preserved ejection fraction (HFpEF) (HCC) 01/12/2024   Physical debility 01/12/2024   Elevated troponin 01/12/2024   Positive D dimer 01/12/2024   History of DVT (deep vein thrombosis)    Hypokalemia 01/10/2024   UTI (urinary tract infection) 01/09/2024   Pressure injury of skin 01/09/2024   Acute hypoxic respiratory failure (HCC) 01/08/2024   Dehydration 01/08/2024   Viral pneumonia 01/08/2024   Normocytic anemia 12/11/2023   Low folate 12/11/2023   Right shoulder pain    Acute blood loss anemia    Essential hypertension    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   Intraventricular hemorrhage (HCC) 09/16/2020   History of stroke 09/16/2020   COPD (chronic obstructive pulmonary disease) (HCC)    Dyslipidemia     Orientation RESPIRATION BLADDER Height & Weight     Self, Place  Normal Incontinent Weight: 164 lb 3.9 oz (74.5 kg) Height:  5' 10 (177.8 cm)  BEHAVIORAL SYMPTOMS/MOOD NEUROLOGICAL BOWEL NUTRITION STATUS      Continent Diet (see d/c summary)  AMBULATORY  STATUS COMMUNICATION OF NEEDS Skin   Extensive Assist Verbally PU Stage and Appropriate Care (pressure injury left hip stage 2; pressure injury buttocks unstageable; pressure injury sacrum stage 2)                       Personal Care Assistance Level of Assistance  Bathing, Feeding, Dressing Bathing Assistance: Maximum assistance Feeding assistance: Independent Dressing Assistance: Maximum assistance     Functional Limitations Info  Sight, Hearing, Speech Sight Info: Adequate Hearing Info: Adequate Speech Info: Adequate    SPECIAL CARE FACTORS FREQUENCY  PT (By licensed PT), OT (By licensed OT)     PT Frequency: 5x/week OT Frequency: 5x/week            Contractures Contractures Info: Not present    Additional Factors Info  Code Status, Allergies Code Status Info: full code Allergies Info: Iodinated Contrast Media           Current Medications (04/16/2024):  This is the current hospital active medication list Current Facility-Administered Medications  Medication Dose Route Frequency Provider Last Rate Last Admin   acetylcysteine  (ACETADOTE ) 18,000 mg in dextrose  5 % 590 mL (30.5085 mg/mL) infusion  6.25 mg/kg/hr Intravenous Continuous Mattie Marvetta SQUIBB, RPH 15.26 mL/hr at 04/16/24 0639 6.25 mg/kg/hr at 04/16/24 9360   Chlorhexidine  Gluconate Cloth 2 % PADS 6 each  6 each Topical Daily Gatha Pence, MD   6 each at 04/16/24 0815   dextrose  10 % infusion   Intravenous Continuous Raenelle Donalda HERO, MD 40 mL/hr at 04/16/24 [id number]  Infusion Verify at 04/16/24 9376   docusate (COLACE) 50 MG/5ML liquid 100 mg  100 mg Oral BID PRN Alva, Rakesh V, MD       feeding supplement (ENSURE PLUS HIGH PROTEIN) liquid 237 mL  237 mL Oral BID BM Alva, Rakesh V, MD   237 mL at 04/16/24 0815   ipratropium-albuterol  (DUONEB) 0.5-2.5 (3) MG/3ML nebulizer solution 3 mL  3 mL Nebulization Q6H PRN Tobie Gaines, DO       lactulose  (CHRONULAC ) 10 GM/15ML solution 20 g  20 g Oral TID Alva, Rakesh  V, MD   20 g at 04/16/24 0815   ondansetron  (ZOFRAN ) injection 4 mg  4 mg Intravenous Q6H PRN Paliwal, Ria, MD       Oral care mouth rinse  15 mL Mouth Rinse PRN Gleason, Leita SAUNDERS, PA-C       Oral care mouth rinse  15 mL Mouth Rinse 4 times per day Jude Harden GAILS, MD   15 mL at 04/16/24 0818   Oral care mouth rinse  15 mL Mouth Rinse PRN Jude Harden GAILS, MD       pantoprazole  (PROTONIX ) injection 40 mg  40 mg Intravenous Q12H Paliwal, Aditya, MD   40 mg at 04/16/24 0816   phytonadione  (VITAMIN K ) 10 mg in dextrose  5 % 50 mL IVPB  10 mg Intravenous Daily Tobie Gaines, DO 51 mL/hr at 04/16/24 0908 10 mg at 04/16/24 0908   polyethylene glycol (MIRALAX  / GLYCOLAX ) packet 17 g  17 g Oral Daily PRN Jude Harden GAILS, MD       potassium chloride  10 mEq in 100 mL IVPB  10 mEq Intravenous Q1 Hr x 4 Ghimire, Donalda HERO, MD 100 mL/hr at 04/16/24 0818 10 mEq at 04/16/24 0818     Discharge Medications: Please see discharge summary for a list of discharge medications.  Relevant Imaging Results:  Relevant Lab Results:   Additional Information SSN : [id number]  Debra SHAUNNA Cumming, LCSW

## 2024-04-16 NOTE — Progress Notes (Signed)
      Progress Note   Subjective  Patient stable overnight - denies complaints. Responds appropriately, tolerating diet.   Objective   Vital signs in last 24 hours: Temp:  [97.2 F (36.2 C)-98.4 F (36.9 C)] 97.6 F (36.4 C) (12/09 0800) Pulse Rate:  [54-73] 61 (12/09 0800) Resp:  [11-22] 22 (12/09 0400) BP: (79-159)/(46-101) 130/66 (12/09 0800) SpO2:  [95 %-100 %] 97 % (12/09 0800) Last BM Date : 04/16/24 General:    white female in NAD Neurologic:  Alert and oriented Psych:  Cooperative. Normal mood and affect.  Intake/Output from previous day: 12/08 0701 - 12/09 0700 In: 1213 [I.V.:1111.9; IV Piggyback:101.1] Out: 250 [Urine:250] Intake/Output this shift: No intake/output data recorded.  Lab Results: Recent Labs    04/14/24 0957 04/15/24 0248 04/16/24 0345  WBC 5.1 5.7 4.0  HGB 9.5* 9.6* 9.4*  HCT 28.8* 28.9* 27.1*  PLT 52*  41* 43*  41* 34*   BMET Recent Labs    04/14/24 1958 04/15/24 0248 04/16/24 0345  NA 143 145 145  K 3.5 4.9 2.8*  CL 110 114* 112*  CO2 20* 21* 17*  GLUCOSE 167* 147* 131*  BUN 40* 38* 23  CREATININE 1.43* 1.27* 0.80  CALCIUM  7.4* 7.4* 8.0*   LFT Recent Labs    04/14/24 1958 04/15/24 0248 04/16/24 0345  PROT 5.3*   < > 6.0*  ALBUMIN  1.9*   < > 2.3*  AST 1,795*   < > 548*  ALT 1,236*   < > 734*  ALKPHOS 82   < > 93  BILITOT 2.0*   < > 1.8*  BILIDIR 1.0*  --   --   IBILI 1.0*  --   --    < > = values in this interval not displayed.   PT/INR Recent Labs    04/15/24 1617 04/16/24 0345  LABPROT 17.9* 19.2*  INR 1.4* 1.5*      Assessment / Plan:    78 y/o female here with the following:  Acute liver failure - significant transaminitis / coagulopathy AKI  See prior notes for full details of her case. She met criteria for acute liver failure, but has significantly improved since her admission. Transaminitis improving, INR has come down from admission. Her mental status is reportedly at baseline, answering  questions appropriately.   In regards to ddx - shock liver I think may be the most likely, was hypotensive for some time and has improved this massive transaminitis rather quickly. She did reportedly take a lot of tylenol , levels were negative, DILI possible, given NAC empirically. Viral workup done - CMV IgM is positive, DNA is pending. I think this would be a bit less likely given how fast her enzymes have some down, would await DNA level. Most cases of CMV hepatitis resolve with supportive measures in immunocompetent patients. Other workup for autoimmune pending but again less likely given interval improvement with supportive measures. Doppler of the liver negative for thrombosis.   Continue supportive measures, complete course of NAC, continue vitamin K . Trend LFTs and INR daily.  No further workup at this time, but please follow up CMV DNA. Once out of the hospital can have her liver enzymes followed up by PCP to make sure they normalize over the upcoming weeks / months.  We will sign off for now, please call with questions / concerns moving forward.  Marcey Naval, MD [facility]

## 2024-04-16 NOTE — Consult Note (Signed)
 [facility]                                                                                        Patient Identification: Patient Name: [name] MRN: [id number] Admit Date: 04/13/2024  8:19 PM Today's Date: 04/16/2024 Reason for consult: Vegetation versus thrombus in TTE Requesting provider: Dr. Raenelle  Principal Problem:   Acute liver failure Active Problems:   Transaminitis   Antibiotics:  Off antibiotics since 12/8 Cefepime  12/6 Metronidazole  12/6 Zosyn  12/6-12/7 Vancomycin  12/6  Lines/Hardware:  Assessment # Sepsis presentation at ED  # Shock liver/acute liver failure with coagulopathy/Hyperbilirubinemia and Thrombocytopenia  -Evaluated by GI and most likely cause 2/2 hypotension with other possibilities considered include DILI in the setting of Tylenol  use, less likely CMV, ANA+.   Doppler negative for thrombosis.  Plan to complete course of NAC, vitamin K  - Liver enzymes improving  # TTE with old vegetation versus thrombus in the AV/severe thickening of the aortic valve  # Encephalopathy -seems to have been improving based on prior documentation  # AKI-resolved # Hypokalemia-to be repleted  Recommendations  - Will start daptomycin  and ceftriaxone  after getting 2 sets of blood cultures today - Will need TEE for better clarifying the aortic valve mass and possibly cardiology evaluation thereafter in case consistent with thrombosis and possibly need to anticoagulate - Monitor CBC and CMP - Universal/standard isolation examination - Will see tomorrow D/w Primary team   Rest of the management as per the primary team. Please call with questions or concerns.  Thank you for the consult  __________________________________________________________________________________________________________ HPI and Hospital Course (patient is a poor historian and hence history is mostly  obtained from the chart) 78 year old female with prior history as below including CVA, HFpEF, COPD, HTN, DVT not on [facility], AA who was initially brought to the ED on 12/6 after being found unresponsive by her roommate (no heat/light under a single blanket, she was apparently covered in feces/dried blood-found to be hypothermic, hypotensive and hypoglycemic). She was thought to have undifferentiated shock on arrival with acute liver failure, acute kidney injury and admitted to the ICU, required vasopressors, D10 infusion and broad-spectrum antibiotics with rapid improvement and then transferred to [facility] on 12/9.  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 (HCC) 09/16/2020   COPD (chronic obstructive pulmonary disease) (HCC)    History of DVT (deep vein thrombosis)    Hypertension    Thoracoabdominal aortic aneurysm    TIA (transient ischemic attack) 09/10/2020   Past Surgical History:  Procedure Laterality Date   IR BONE  MARROW BIOPSY & ASPIRATION  12/13/2023   IR CT HEAD LTD  09/11/2020   IR INTRAVSC STENT CERV CAROTID W/O EMB-PROT MOD SED INC ANGIO  09/11/2020   IR PERCUTANEOUS ART THROMBECTOMY/INFUSION INTRACRANIAL INC DIAG ANGIO  09/11/2020       IR PERCUTANEOUS ART THROMBECTOMY/INFUSION INTRACRANIAL INC DIAG ANGIO  09/11/2020   IR US  GUIDE VASC ACCESS RIGHT  09/11/2020   NO PAST SURGERIES     RADIOLOGY  WITH ANESTHESIA N/A 09/11/2020   Procedure: IR WITH ANESTHESIA;  Surgeon: Dolphus Carrion, MD;  Location: [facility] OR;  Service: Radiology;  Laterality: N/A;    Scheduled Meds:  Chlorhexidine  Gluconate Cloth  6 each Topical Daily   feeding supplement  237 mL Oral BID BM   lactulose   20 g Oral TID   mouth rinse  15 mL Mouth Rinse 4 times per day   pantoprazole  (PROTONIX ) IV  40 mg Intravenous Q12H   Continuous Infusions:  acetylcysteine  6.25 mg/kg/hr (04/16/24 0639)   dextrose  20 mL/hr at 04/16/24 1007   potassium chloride  10 mEq (04/16/24 1059)   PRN Meds:.docusate, ipratropium-albuterol , ondansetron  (ZOFRAN ) IV, mouth rinse, mouth rinse, polyethylene glycol  Allergies  Allergen Reactions   Iodinated Contrast Media Itching and Other (See Comments)    9/1 developed diffuse itching after contrast dye for CT (without rash or airway involvement or GI symptoms) . Consider pretreatment before IV contrast in the future    Social History   Socioeconomic History   Marital status: Single    Spouse name: Not on file   Number of children: Not on file   Years of education: Not on file   Highest education level: Not on file  Occupational History   Not on file  Tobacco Use   Smoking status: Former    Current packs/day: 0.00    Types: Cigarettes    Quit date: 05/10/1987    Years since quitting: 36.9   Smokeless tobacco: Never  Vaping Use   Vaping status: Never Used  Substance and Sexual Activity   Alcohol use: Not Currently   Drug use: Never   Sexual activity: Not Currently    Birth control/protection: None  Other Topics Concern   Not on file  Social History Narrative   Not on file   Social Drivers of Health   Financial Resource Strain: Not on file  Food Insecurity: Food Insecurity Present (04/15/2024)   Hunger Vital Sign    Worried About Running Out of Food in the Last Year: Often true    Ran Out of Food in the Last Year: Often true  Transportation Needs: Unmet Transportation  Needs (04/15/2024)   PRAPARE - Administrator, Civil Service (Medical): Yes    Lack of Transportation (Non-Medical): Yes  Physical Activity: Not on file  Stress: Not on file  Social Connections: Unknown (04/15/2024)   Social Connection and Isolation Panel    Frequency of Communication with Friends and Family: Patient unable to answer    Frequency of Social Gatherings with Friends and Family: Patient unable to answer    Attends Religious Services: Patient unable to answer    Active Member of Clubs or Organizations: Patient unable to answer    Attends Banker Meetings: Patient unable to answer    Marital Status: Not on file  Intimate Partner Violence: Patient Unable To Answer (04/15/2024)   Humiliation, Afraid, Rape, and Kick questionnaire    Fear of Current or Ex-Partner: Patient unable to answer    Emotionally Abused:  Patient unable to answer    Physically Abused: Patient unable to answer    Sexually Abused: Patient unable to answer   Family History  Problem Relation Age of Onset   Hypertension Mother     Vitals BP 130/66 (BP Location: Left Arm)   Pulse 61   Temp 97.6 F (36.4 C) (Oral)   Resp (!) 22   Ht 5' 10 (1.778 m)   Wt 74.5 kg   SpO2 97%   BMI 23.57 kg/m    Physical Exam Constitutional: Chronically ill-appearing elderly female lying in the bed    Comments: HEENT WNL  Cardiovascular:     Rate and Rhythm: Normal rate      Heart sounds:   Pulmonary:     Effort: Pulmonary effort is normal air    Comments:   Abdominal:     Palpations: Abdomen is soft.     Tenderness: Nondistended and nontender  Musculoskeletal:        General: No swelling or tenderness in the peripheral joints  Skin:    Comments: No rashes  Neurological:     General: Awake, alert, follows some commands but still some confusion  Psychiatric:        Mood and Affect: Mood normal.   Limited exam as patient was getting blood draw as well as she was cleaned by RN  due to fecal soiling  Pertinent Microbiology Results for orders placed or performed during the hospital encounter of 04/13/24  MRSA Next Gen by PCR, Nasal     Status: None   Collection Time: 04/13/24 12:51 AM   Specimen: Nasal Mucosa; Nasal Swab  Result Value Ref Range Status   MRSA by PCR Next Gen NOT DETECTED NOT DETECTED Final    Comment: (NOTE) The GeneXpert MRSA Assay (FDA approved for NASAL specimens only), is one component of a comprehensive MRSA colonization surveillance program. It is not intended to diagnose MRSA infection nor to guide or monitor treatment for MRSA infections. Test performance is not FDA approved in patients less than 93 years old. Performed at [facility], [address] [place], [place] [address]   Resp panel by RT-PCR (RSV, Flu A&B, Covid) Anterior Nasal Swab     Status: None   Collection Time: 04/13/24  8:30 PM   Specimen: Anterior Nasal Swab  Result Value Ref Range Status   SARS Coronavirus 2 by RT PCR NEGATIVE NEGATIVE Final   Influenza A by PCR NEGATIVE NEGATIVE Final   Influenza B by PCR NEGATIVE NEGATIVE Final    Comment: (NOTE) The Xpert Xpress SARS-CoV-2/FLU/RSV plus assay is intended as an aid in the diagnosis of influenza from Nasopharyngeal swab specimens and should not be used as a sole basis for treatment. Nasal washings and aspirates are unacceptable for Xpert Xpress SARS-CoV-2/FLU/RSV testing.  Fact Sheet for Patients: bloggercourse.com  Fact Sheet for Healthcare Providers: seriousbroker.it  This test is not yet approved or cleared by the [place]  FDA and has been authorized for detection and/or diagnosis of SARS-CoV-2 by FDA under an Emergency Use Authorization (EUA). This EUA will remain in effect (meaning this test can be used) for the duration of the COVID-19 declaration under Section 564(b)(1) of the Act, 21 U.S.C. section 360bbb-3(b)(1), unless the authorization  is terminated or revoked.     Resp Syncytial Virus by PCR NEGATIVE NEGATIVE Final    Comment: (NOTE) Fact Sheet for Patients: bloggercourse.com  Fact Sheet for Healthcare Providers: seriousbroker.it  This test is not yet approved or  cleared by the [place]  FDA and has been authorized for detection and/or diagnosis of SARS-CoV-2 by FDA under an Emergency Use Authorization (EUA). This EUA will remain in effect (meaning this test can be used) for the duration of the COVID-19 declaration under Section 564(b)(1) of the Act, 21 U.S.C. section 360bbb-3(b)(1), unless the authorization is terminated or revoked.  Performed at [facility], [address], [place], [place] [address]   Blood Culture (routine x 2)     Status: None (Preliminary result)   Collection Time: 04/13/24  8:30 PM   Specimen: BLOOD  Result Value Ref Range Status   Specimen Description BLOOD SITE NOT SPECIFIED  Final   Special Requests   Final    BOTTLES DRAWN AEROBIC AND ANAEROBIC Blood Culture adequate volume   Culture   Final    NO GROWTH 3 DAYS Performed at [facility], [address] [place], [place] [address]    Report Status PENDING  Incomplete  Blood Culture (routine x 2)     Status: None (Preliminary result)   Collection Time: 04/13/24  8:35 PM   Specimen: BLOOD LEFT FOREARM  Result Value Ref Range Status   Specimen Description BLOOD LEFT FOREARM  Final   Special Requests   Final    BOTTLES DRAWN AEROBIC AND ANAEROBIC Blood Culture results may not be optimal due to an inadequate volume of blood received in culture bottles   Culture   Final    NO GROWTH 3 DAYS Performed at [facility], [address], [place], [place] [address]    Report Status PENDING  Incomplete   Pertinent Lab seen by me:    Latest Ref Rng & Units 04/16/2024    3:45 AM 04/15/2024    2:48 AM 04/14/2024    9:57 AM  CBC  WBC 4.0 - 10.5 K/uL 4.0  5.7  5.1    Hemoglobin 12.0 - 15.0 g/dL 9.4  9.6  9.5   Hematocrit 36.0 - 46.0 % 27.1  28.9  28.8   Platelets 150 - 400 K/uL 34  41    43  41    52       Latest Ref Rng & Units 04/16/2024    3:45 AM 04/15/2024    2:48 AM 04/14/2024    7:58 PM  CMP  Glucose 70 - 99 mg/dL 868  852  832   BUN 8 - 23 mg/dL 23  38  40   Creatinine 0.44 - 1.00 mg/dL 9.19  8.72  8.56   Sodium 135 - 145 mmol/L 145  145  143   Potassium 3.5 - 5.1 mmol/L 2.8  4.9  3.5   Chloride 98 - 111 mmol/L 112  114  110   CO2 22 - 32 mmol/L 17  21  20    Calcium  8.9 - 10.3 mg/dL 8.0  7.4  7.4   Total Protein 6.5 - 8.1 g/dL 6.0  5.5  5.3   Total Bilirubin 0.0 - 1.2 mg/dL 1.8  2.0  2.0   Alkaline Phos 38 - 126 U/L 93  90  82   AST 15 - 41 U/L 548  1,475  1,795   ALT 0 - 44 U/L 734  1,157  1,236      Pertinent Imagings/Other Imagings Plain films and CT images have been personally visualized and interpreted; radiology reports have been reviewed. Decision making incorporated into the Impression / Recommendations. US  ABD LTD RUG W/LIVER DOPPLER Result Date:  04/16/2024 EXAM: RIGHT UPPER QUADRANT ULTRASOUND WITH DOPPLER EVALUATION 04/15/2024 11:50:00 PM COMPARISON: Right upper quadrant ultrasound 04/14/2024. CT 04/13/2024. CLINICAL HISTORY: Elevated LFTs. FINDINGS: LIVER: Increased echotexture throughout the liver is compatible with fatty infiltration. No evidence of intrahepatic biliary ductal dilatation. OTHER: No evidence of right upper quadrant ascites. Vascular/Doppler: Doppler interrogation of the abdominal vasculature was performed. There is normal Doppler flow in the normal direction of the hepatic vasculature. Normal hepatopetal flow of the hepatic artery and portal vein. Normal hepatofugal flow of the hepatic veins. Normal Doppler waveforms are visualized. IMPRESSION: 1. Hepatic steatosis. 2. No evidence of hepatic vein or portal vein thrombosis. Electronically signed by: Franky Crease MD 04/16/2024 01:23 AM EST RP Workstation:  [id number]   DG Swallowing Func-Speech Pathology Result Date: 04/15/2024 Table formatting from the original result was not included. Modified Barium Swallow Study Patient Details Name: Shamecca Whitebread MRN: [id number] Date of Birth: May 14, 1945 Today's Date: 04/15/2024 HPI/PMH: HPI: Ms. Blackston is a 78 y/o female presenting 04/14/24 after roommate called EMS as the patient was unresponsive. She reports chest pain and was very confused. Pt found under single blanket, alone with no heat or lights on in home.   PMHx:  HTN, HLD, CVA left hemiparesis, CAPD, DVT, IVH. UA positive. SLP consulted for clinical swallow assessment. Clinical Impression: Pt presents with a mild-moderate oropharyngeal dysphagia per MBSS completed today. Recommend a mechanical altered/ground diet with nectar-thick liquids (straw ok), and meds crushed in applesauce. Oral deficits included reduced oral strength and coordination resulting in intermittent anterior loss of liquids by spoon or cup sip. Pt impulsively drank all liquids and was unable to try single sips despite cues. There was consistent posterior loss of thin and nectar-thick liquids to the level of the pyriform sinuses pre swallow with some pooling. There was piecemeal swallow of pudding and softened cracker. Min cracker residue on hard palate cleared with prompted nectar-thick liquid wash. Pharyngeal deficits included reduced base of tongue retraction, reduced laryngeal vestibule closure, reduced pharyngeal stripping, and reduced distention of UES. Findings -There was transient aspiration of thin liquid residue during cleansing swallows. Residue was ejected to the level of the vocal cords (PAS-5) or the laryngeal vestibule (PAS-3) during swallow, which was inaudible. No stagnant aspiration in the airway observed. -There x1 instance of stagnant penetration of nectar-thick liquid wash post cracker. Penetrated residue did start to progress to level of the vocal cords (PAS3 - 5); though ejected  with cued throat clear + swallow. All other penetration instances of nectar-thick liquids were transient. -There was min diffuse pharyngeal residue following liquids and pudding. A moderate amount of pharyngeal residue observed post cracker trial, which was cleared with cued dry swallow. Plan: SLP will follow up to assess diet tolerance and modify diet as indicated. Pt may benefit from a repeat MBSS prior to liquid advancement given that inaudible responses to penetration/aspiration events. Factors that may increase risk of adverse event in presence of aspiration Noe & Lianne 2021): Factors that may increase risk of adverse event in presence of aspiration Noe & Lianne 2021): Reduced cognitive function; Limited mobility; Frail or deconditioned; Dependence for feeding and/or oral hygiene; Inadequate oral hygiene; Reduced saliva; Weak cough Recommendations/Plan: Swallowing Evaluation Recommendations Swallowing Evaluation Recommendations Recommendations: PO diet PO Diet Recommendation: Dysphagia 2 (Finely chopped); Mildly thick liquids (Level 2, nectar thick) Liquid Administration via: Cup; Straw Medication Administration: Crushed with puree Supervision: Staff to assist with self-feeding; Full supervision/cueing for swallowing strategies Swallowing strategies  : Minimize environmental distractions; Slow rate; Small bites/sips; Clear  throat intermittently; Follow solids with liquids Postural changes: Position pt fully upright for meals Oral care recommendations: Oral care BID (2x/day) Treatment Plan Treatment Plan Treatment recommendations: Therapy as outlined in treatment plan below Follow-up recommendations: Skilled nursing-short term rehab (<3 hours/day) Functional status assessment: Patient has had a recent decline in their functional status and demonstrates the ability to make significant improvements in function in a reasonable and predictable amount of time. Treatment frequency: Min 1x/week Treatment  duration: 2 weeks Interventions: Diet toleration management by SLP; Trials of upgraded texture/liquids; Patient/family education; Compensatory techniques Recommendations Recommendations for follow up therapy are one component of a multi-disciplinary discharge planning process, led by the attending physician.  Recommendations may be updated based on patient status, additional functional criteria and insurance authorization. Assessment: Orofacial Exam: Orofacial Exam Oral Cavity: Oral Hygiene: Dried secretions Oral Cavity - Dentition: Poor condition; Missing dentition Orofacial Anatomy: WFL Oral Motor/Sensory Function: Unable to test Anatomy: Anatomy: WFL Boluses Administered: Boluses Administered Boluses Administered: Thin liquids (Level 0); Mildly thick liquids (Level 2, nectar thick); Puree; Solid  Oral Impairment Domain: Oral Impairment Domain Lip Closure: Escape from interlabial space or lateral juncture, no extension beyond vermillion border Tongue control during bolus hold: Posterior escape of greater than half of bolus Bolus preparation/mastication: Disorganized chewing/mashing with solid pieces of bolus unchewed Bolus transport/lingual motion: Slow tongue motion Oral residue: Residue collection on oral structures Location of oral residue : Tongue Initiation of pharyngeal swallow : Pyriform sinuses  Pharyngeal Impairment Domain: Pharyngeal Impairment Domain Soft palate elevation: No bolus between soft palate (SP)/pharyngeal wall (PW) Laryngeal elevation: Complete superior movement of thyroid  cartilage with complete approximation of arytenoids to epiglottic petiole Anterior hyoid excursion: Complete anterior movement Epiglottic movement: Complete inversion Laryngeal vestibule closure: Incomplete, narrow column air/contrast in laryngeal vestibule Pharyngeal stripping wave : Present - diminished Pharyngeal contraction (A/P view only): N/A Pharyngoesophageal segment opening: Partial distention/partial duration,  partial obstruction of flow Tongue base retraction: Narrow column of contrast or air between tongue base and PPW Pharyngeal residue: Collection of residue within or on pharyngeal structures Location of pharyngeal residue: Tongue base; Valleculae; Pyriform sinuses  Esophageal Impairment Domain: Esophageal Impairment Domain Esophageal clearance upright position: Esophageal retention Pill: No data recorded Penetration/Aspiration Scale Score: Penetration/Aspiration Scale Score 1.  Material does not enter airway: Puree; Solid 2.  Material enters airway, remains ABOVE vocal cords then ejected out: Mildly thick liquids (Level 2, nectar thick) 3.  Material enters airway, remains ABOVE vocal cords and not ejected out: Mildly thick liquids (Level 2, nectar thick); Thin liquids (Level 0) 5.  Material enters airway, CONTACTS cords and not ejected out: Thin liquids (Level 0) 6.  Material enters airway, passes BELOW cords then ejected out: Thin liquids (Level 0) Compensatory Strategies: Compensatory Strategies Compensatory strategies: Yes Multiple swallows: Effective (when pt able to follow cue) Effective Multiple Swallows: Solid Chin tuck: -- (unable to attempt) Other(comment): Effective (throat clear+ swallow to eject penetration) Effective Other(comment): Mildly thick liquid (Level 2, nectar thick)   General Information: Caregiver present: No  Diet Prior to this Study: NPO   Temperature : Normal   Respiratory Status: WFL   Supplemental O2: None (Room air)   History of Recent Intubation: No  Behavior/Cognition: Alert; Cooperative [facility]) Self-Feeding Abilities: Needs set-up for self-feeding Baseline vocal quality/speech: Hypophonia/low volume Volitional Cough: Unable to elicit (occasional throat clear elicited) Volitional Swallow: Able to elicit (delayed) No data recorded Goal Planning: Prognosis for improved oropharyngeal function: Fair No data recorded No data recorded Patient/Family Stated Goal: none  stated Consulted and agree  with results and recommendations: Patient; Nurse Pain: Pain Assessment Pain Assessment: No/denies pain Pain Score: 0 Faces Pain Scale: 2 Facial Expression: 0 Body Movements: 0 Muscle Tension: 0 Compliance with ventilator (intubated pts.): N/A Vocalization (extubated pts.): 0 CPOT Total: 0 Pain Location: grimacing with mobility tasks Pain Descriptors / Indicators: Grimacing End of Session: Start Time:SLP Start Time (ACUTE ONLY): 1438 Stop Time: SLP Stop Time (ACUTE ONLY): 1500 Time Calculation:SLP Time Calculation (min) (ACUTE ONLY): 22 min Charges: SLP Evaluations $ SLP Speech Visit: 1 Visit SLP Evaluations $MBS Swallow: 1 Procedure SLP visit diagnosis: SLP Visit Diagnosis: Dysphagia, oropharyngeal phase (R13.12) Past Medical History: Past Medical History: Diagnosis Date  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 (HCC) 09/16/2020  COPD (chronic obstructive pulmonary disease) (HCC)   History of DVT (deep vein thrombosis)   Hypertension   Thoracoabdominal aortic aneurysm   TIA (transient ischemic attack) 09/10/2020 Past Surgical History: Past Surgical History: Procedure Laterality Date  IR BONE MARROW BIOPSY & ASPIRATION  12/13/2023  IR CT HEAD LTD  09/11/2020  IR INTRAVSC STENT CERV CAROTID W/O EMB-PROT MOD SED INC ANGIO  09/11/2020  IR PERCUTANEOUS ART THROMBECTOMY/INFUSION INTRACRANIAL INC DIAG ANGIO  09/11/2020     IR PERCUTANEOUS ART THROMBECTOMY/INFUSION INTRACRANIAL INC DIAG ANGIO  09/11/2020  IR US  GUIDE VASC ACCESS RIGHT  09/11/2020  NO PAST SURGERIES    RADIOLOGY WITH ANESTHESIA N/A 09/11/2020  Procedure: IR WITH ANESTHESIA;  Surgeon: Dolphus Carrion, MD;  Location: [facility] OR;  Service: Radiology;  Laterality: N/A; [name] 04/15/2024, 3:33 PM  ECHOCARDIOGRAM COMPLETE Result Date: 04/15/2024    ECHOCARDIOGRAM REPORT   Patient Name:   MILTON STREICHER Date of Exam: 04/15/2024 Medical Rec #:  [id number]   Height:       70.0 in Accession #:    [id number]  Weight:       164.2  lb Date of Birth:  1945/06/27  BSA:          1.920 m Patient Age:    78 years    BP:           134/57 mmHg Patient Gender: F           HR:           56 bpm. Exam Location:  Inpatient Procedure: 2D Echo, Cardiac Doppler and Color Doppler (Both Spectral and Color            Flow Doppler were utilized during procedure). Indications:    Shock  History:        Patient has prior history of Echocardiogram examinations, most                 recent 01/12/2024. Stroke, Signs/Symptoms:Altered Mental Status;                 Risk Factors:Dyslipidemia and Hypertension. ICH.  Sonographer:    Ellouise Mose RDCS Referring Phys: [phone] RAKESH V ALVA IMPRESSIONS  1. Left ventricular ejection fraction, by estimation, is 50 to 55%. Left ventricular ejection fraction by 2D MOD biplane is 51.5 %. Left ventricular ejection fraction by PLAX is 43 %. The left ventricle has low normal function. The left ventricle demonstrates global hypokinesis. There is moderate left ventricular hypertrophy. Left ventricular diastolic parameters are consistent with Grade I diastolic dysfunction (impaired relaxation).  2. Right ventricular systolic function is normal. The right ventricular size is normal. Tricuspid regurgitation signal is inadequate for assessing PA pressure.  3. The mitral valve  is degenerative. Trivial mitral valve regurgitation. No evidence of mitral stenosis.  4. Calcified mass on the non-coronary cusp with some mobile material, measures 0.5 x 1.0 cm, could be old vegetation or thrombus. The aortic valve is tricuspid. There is severe thickening of the aortic valve. Aortic valve regurgitation is not visualized. Aortic valve sclerosis/calcification is present, without any evidence of aortic stenosis.  5. Aortic dilatation noted. There is mild dilatation of the ascending aorta, measuring 40 mm.  6. The inferior vena cava is dilated in size with <50% respiratory variability, suggesting right atrial pressure of 15 mmHg. Comparison(s): Changes from  prior study are noted. 01/12/2024: LVEF 60-65%. FINDINGS  Left Ventricle: Left ventricular ejection fraction, by estimation, is 50 to 55%. Left ventricular ejection fraction by PLAX is 43 %. Left ventricular ejection fraction by 2D MOD biplane is 51.5 %. The left ventricle has low normal function. The left ventricle demonstrates global hypokinesis. 3D ejection fraction reviewed and evaluated as part of the interpretation. Alternate measurement of EF is felt to be most reflective of LV function. The left ventricular internal cavity size was normal in size. There is moderate left ventricular hypertrophy. Left ventricular diastolic parameters are consistent with Grade I diastolic dysfunction (impaired relaxation). Indeterminate filling pressures. Right Ventricle: The right ventricular size is normal. No increase in right ventricular wall thickness. Right ventricular systolic function is normal. Tricuspid regurgitation signal is inadequate for assessing PA pressure. Left Atrium: Left atrial size was normal in size. Right Atrium: Right atrial size was normal in size. Pericardium: There is no evidence of pericardial effusion. Mitral Valve: The mitral valve is degenerative in appearance. Mild to moderate mitral annular calcification. Trivial mitral valve regurgitation. No evidence of mitral valve stenosis. Tricuspid Valve: The tricuspid valve is normal in structure. Tricuspid valve regurgitation is not demonstrated. No evidence of tricuspid stenosis. Aortic Valve: Calcified mass on the non-coronary cusp with some mobile material, measures 0.5 x 1.0 cm, could be old vegetation or thrombus. The aortic valve is tricuspid. There is severe thickening of the aortic valve. Aortic valve regurgitation is not visualized. Aortic valve sclerosis/calcification is present, without any evidence of aortic stenosis. Pulmonic Valve: The pulmonic valve was normal in structure. Pulmonic valve regurgitation is not visualized. No evidence of  pulmonic stenosis. Aorta: Aortic dilatation noted. There is mild dilatation of the ascending aorta, measuring 40 mm. Venous: The inferior vena cava is dilated in size with less than 50% respiratory variability, suggesting right atrial pressure of 15 mmHg. IAS/Shunts: No atrial level shunt detected by color flow Doppler. Additional Comments: 3D was performed not requiring image post processing on an independent workstation and was abnormal.  LEFT VENTRICLE PLAX 2D                        Biplane EF (MOD) LV EF:         Left            LV Biplane EF:   Left                ventricular                      ventricular                ejection                         ejection  fraction by                      fraction by                PLAX is 43                       2D MOD                %.                               biplane is LVIDd:         4.70 cm                          51.5 %. LVIDs:         3.70 cm LV PW:         1.50 cm         Diastology LV IVS:        1.50 cm         LV e' medial:    3.81 cm/s LVOT diam:     2.30 cm         LV E/e' medial:  11.5 LV SV:         87              LV e' lateral:   3.70 cm/s LV SV Index:   45              LV E/e' lateral: 11.9 LVOT Area:     4.15 cm  LV Volumes (MOD) LV vol d, MOD    96.7 ml       3D Volume EF: A2C:                           3D EF:        46 % LV vol d, MOD    81.7 ml       LV EDV:       174 ml A4C:                           LV ESV:       95 ml LV vol s, MOD    44.3 ml       LV SV:        79 ml A2C: LV vol s, MOD    42.3 ml A4C: LV SV MOD A2C:   52.4 ml LV SV MOD A4C:   81.7 ml LV SV MOD BP:    46.9 ml RIGHT VENTRICLE             IVC RV S prime:     17.70 cm/s  IVC diam: 2.10 cm TAPSE (M-mode): 1.9 cm                             PULMONARY VEINS                             Diastolic Velocity: 27.90 cm/s  S/D Velocity:       1.60                             Systolic Velocity:  44.10 cm/s LEFT ATRIUM             Index         RIGHT ATRIUM           Index LA diam:        3.90 cm 2.03 cm/m   RA Area:     11.40 cm LA Vol (A2C):   39.3 ml 20.47 ml/m  RA Volume:   24.10 ml  12.55 ml/m LA Vol (A4C):   42.8 ml 22.30 ml/m LA Biplane Vol: 41.7 ml 21.72 ml/m  AORTIC VALVE LVOT Vmax:   100.00 cm/s LVOT Vmean:  58.600 cm/s LVOT VTI:    0.210 m  AORTA Ao Root diam: 3.30 cm Ao Asc diam:  3.95 cm MITRAL VALVE MV Area (PHT): 2.32 cm    SHUNTS MV Decel Time: 327 msec    Systemic VTI:  0.21 m MV E velocity: 44.00 cm/s  Systemic Diam: 2.30 cm MV A velocity: 89.70 cm/s MV E/A ratio:  0.49 Vinie Maxcy MD Electronically signed by Vinie Maxcy MD Signature Date/Time: 04/15/2024/3:29:26 PM    Final    DG Abd Portable 1V Result Date: 04/14/2024 EXAM: 1 VIEW XRAY OF THE ABDOMEN 04/14/2024 06:02:00 PM COMPARISON: Same day knees. CLINICAL HISTORY: Confusion. FINDINGS: LINES, TUBES AND DEVICES: Nasogastric tube tip has been advanced into body of stomach and is in grossly good position. BOWEL: Nonobstructive bowel gas pattern. SOFT TISSUES: No abnormal calcifications. BONES: No acute fracture. IMPRESSION: 1. Nasogastric tube tip projects over the body of the stomach in expected position. Electronically signed by: Lynwood Seip MD 04/14/2024 06:13 PM EST RP Workstation: [id number]   DG Abd 1 View Result Date: 04/14/2024 EXAM: 1 VIEW XRAY OF THE ABDOMEN 04/14/2024 12:00:00 PM COMPARISON: None available. CLINICAL HISTORY: Nasogastric tube present. FINDINGS: LINES, TUBES AND DEVICES: Enteric tube in place with tip projecting over the stomach. Side port projecting over the distal esophagus. BOWEL: Nonobstructive bowel gas pattern. SOFT TISSUES: Atherosclerotic calcifications and abdominal aortic aneurysm. BONES: No acute fracture. IMPRESSION: 1. Enteric tube in place with tip projecting over the stomach and side port projecting over the distal esophagus. Recommend advancing the tube so the side port is beyond the gastroesophageal junction. 2. Abdominal aortic  aneurysm with atherosclerotic calcifications. This is described in greater detail on CT of the abdomen and pelvis without contrast 04/13/2024 . Electronically signed by: Lonni Necessary MD 04/14/2024 04:27 PM EST RP Workstation: [id number]   US  RENAL Result Date: 04/14/2024 EXAM: US  Retroperitoneum Complete, Renal. 04/14/2024 12:20:00 PM TECHNIQUE: Real-time ultrasonography of the retroperitoneum renal was performed. COMPARISON: None available CLINICAL HISTORY: Acute kidney failure. FINDINGS: FINDINGS: RIGHT KIDNEY/URETER: Right kidney measures 10.5 x 5.5 x 5.0 cm. Normal cortical echogenicity. No hydronephrosis. No calculus. No mass. LEFT KIDNEY/URETER: Left kidney measures 10.5 x 5.3 x 4.9 cm. Normal cortical echogenicity. No hydronephrosis. No calculus. No mass. BLADDER: Urinary bladder is decompressed secondary to foley catheter. IMPRESSION: 1. No acute findings. Electronically signed by: Lynwood Seip MD 04/14/2024 12:50 PM EST RP Workstation: [id number]   US  Abdomen Limited RUQ (LIVER/GB) Result Date: 04/14/2024 EXAM: Right Upper Quadrant Abdominal Ultrasound TECHNIQUE: Real-time ultrasonography of the right upper quadrant of the abdomen was performed. COMPARISON: None available. CLINICAL HISTORY: LFT elevation. FINDINGS: LIVER: Increased hepatic steatosis.  No intrahepatic biliary ductal dilatation. No mass. BILIARY SYSTEM: No evidence of pericholecystic fluid or wall thickening. No cholelithiasis. Common bile duct is within normal limits measuring 2.3 mm. RIGHT KIDNEY: The right kidney is grossly unremarkable in appearances without evidence of hydronephrosis, echogenic calculi or worrisome mass lesions. OTHER: No right upper quadrant ascites. IMPRESSION: 1. Hepatic steatosis. Electronically signed by: Morgane Naveau MD 04/14/2024 12:40 AM EST RP Workstation: [id number]   DG Chest Port 1 View Result Date: 04/13/2024 EXAM: 1 VIEW(S) XRAY OF THE CHEST 04/13/2024 08:39:00 PM COMPARISON: 01/11/2024.  CLINICAL HISTORY: Questionable sepsis - evaluate for abnormality FINDINGS: LUNGS AND PLEURA: Left pleural calcifications noted. No focal pulmonary opacity. No pleural effusion. No pneumothorax. HEART AND MEDIASTINUM: Aortic atherosclerosis. BONES AND SOFT TISSUES: No acute osseous abnormality. IMPRESSION: 1. No acute cardiopulmonary abnormality. 2. Left pleural calcifications. 3. Aortic atherosclerosis. Electronically signed by: Franky Crease MD 04/13/2024 09:40 PM EST RP Workstation: [id number]   CT CHEST ABDOMEN PELVIS WO CONTRAST Result Date: 04/13/2024 EXAM: CT CHEST, ABDOMEN AND PELVIS WITHOUT CONTRAST 04/13/2024 08:59:36 PM TECHNIQUE: CT of the chest, abdomen and pelvis was performed without the administration of intravenous contrast. Multiplanar reformatted images are provided for review. Automated exposure control, iterative reconstruction, and/or weight based adjustment of the mA/kV was utilized to reduce the radiation dose to as low as reasonably achievable. COMPARISON: 01/12/2024 CLINICAL HISTORY: abd pain FINDINGS: CHEST: MEDIASTINUM AND LYMPH NODES: Heart and pericardium are unremarkable. Diffuse coronary artery and aortic atherosclerosis. The central airways are clear. No mediastinal, hilar or axillary lymphadenopathy. LUNGS AND PLEURA: Like scarring in the left lower lobe posteriorly. Calcified pleural plaques in the left hemithorax, stable. No focal consolidation or pulmonary edema. No pleural effusion or pneumothorax. ABDOMEN AND PELVIS: LIVER: The liver is unremarkable. GALLBLADDER AND BILE DUCTS: Gallbladder is unremarkable. No biliary ductal dilatation. SPLEEN: No acute abnormality. PANCREAS: No acute abnormality. ADRENAL GLANDS: No acute abnormality. KIDNEYS, URETERS AND BLADDER: Foley catheter in the bladder, which is decompressed. No stones in the kidneys or ureters. No hydronephrosis. No perinephric or periureteral stranding. GI AND BOWEL: Stomach demonstrates no acute abnormality. Normal  appendix. There is no bowel obstruction. REPRODUCTIVE ORGANS: No acute abnormality. PERITONEUM AND RETROPERITONEUM: No ascites. No free air. VASCULATURE: Infrarenal abdominal aortic aneurysm measures up to 4.2 cm. 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 Diffuse aortoiliac atherosclerosis. ABDOMINAL AND PELVIS LYMPH NODES: No lymphadenopathy. BONES AND SOFT TISSUES: No acute osseous abnormality. No focal soft tissue abnormality. IMPRESSION: 1. 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 Diffuse aortoiliac atherosclerosis; recommend CT or MRI in 12 months and establish or continue care with a vascular specialist. 2. Coronary artery disease. 3. Calcified pleural plaques on the left. Scarring at the left lung base. Electronically signed by: Franky Crease MD 04/13/2024 09:06 PM EST RP Workstation: [id number]   CT Head Wo Contrast Result Date: 04/13/2024 EXAM: CT HEAD WITHOUT CONTRAST 04/13/2024 08:58:58 PM TECHNIQUE: CT of the head was performed without the administration of intravenous contrast. Automated exposure control, iterative reconstruction, and/or weight based adjustment of the mA/kV was utilized to reduce the radiation dose to as low as reasonably achievable. COMPARISON: 12/10/2023 CLINICAL HISTORY: delirium FINDINGS: BRAIN AND VENTRICLES: No acute hemorrhage. No evidence of acute infarct. Age-related cerebral and cerebellar volume loss. Remote encephalomalacia in right parietal lobe. Mild periventricular white matter disease. Ex vacuo dilatation of right lateral ventricle. No extra-axial collection. No mass effect or midline shift. Moderate  calcific atheromatous disease within carotid siphons and vertebral arteries. ORBITS: No acute abnormality. SINUSES: Moderate bilateral ethmoid sinus disease. Bilateral maxillary sinus disease with air-fluid levels. Mild bilateral sphenoid and frontal sinus disease. SOFT  TISSUES AND SKULL: No acute soft tissue abnormality. No skull fracture. IMPRESSION: 1. No acute intracranial abnormality. 2. Stable right parietal cortical encephalomalacia. 3. Stable age-related changes. 4. Bilateral maxillary sinus disease with air-fluid levels, with additional ethmoid, sphenoid, and frontal sinus involvement, suggestive of acute sinusitis. Electronically signed by: Dorethia Molt MD 04/13/2024 09:05 PM EST RP Workstation: [id number]   DG Hip Unilat W or Wo Pelvis 2-3 Views Left Result Date: 03/22/2024 EXAM: 2 or 3 VIEW(S) XRAY OF THE LEFT HIP 03/22/2024 10:26:31 PM COMPARISON: 12/10/2023. CLINICAL HISTORY: hip pain, no trauma hip pain, no trauma FINDINGS: BONES AND JOINTS: No acute fracture or focal osseous lesion. The hip joint is maintained. No significant degenerative changes. SOFT TISSUES: The soft tissues are unremarkable. IMPRESSION: 1. No significant abnormality in the left hip or visualized pelvis. Electronically signed by: Dorethia Molt MD 03/22/2024 10:30 PM EST RP Workstation: [id number]    I spent 83 minutes involved in face-to-face and non-face-to-face activities for this patient on the day of the visit. Professional time spent includes the following activities: Preparing to see the patient (review of tests), Obtaining and reviewing separately obtained history (H&P, hospitalist progress note, GI note), Performing a medically appropriate examination and evaluation , Ordering medications/labs, referring and communicating with other health care professionals, Documenting clinical information in the EMR, Independently interpreting results (not separately reported), and Care coordination (not separately reported).  Electronically signed by:   Plan d/w requesting provider as well as ID pharm D  Of note, portions of this note may have been created with voice recognition software. While this note has been edited for accuracy, occasional wrong-word or 'sound-a-like' substitutions  may have occurred due to the inherent limitations of voice recognition software.   Annalee Orem, MD Infectious Disease Physician [facility] Pager: [phone]

## 2024-04-16 NOTE — Progress Notes (Addendum)
 PROGRESS NOTE        PATIENT DETAILS Name: [name] Age: 78 y.o. Sex: female Date of Birth: 22-Feb-1946 Admit Date: 04/13/2024 Admitting Physician Tamela Stakes, MD ERE:Bzmryzrx, Rojelio PARAS, NP  Brief Summary: Patient is a 78 y.o.  female with history of CVA, COPD, HTN-who was brought to the ED after she was found by her roommate unresponsive (no heat/light-under a single blanket)-she was apparently covered in feces/dried blood-found to be hypothermic/hypotensive/hypoglycemic-she was thought to have undifferentiated shock-acute liver failure-acute kidney injury-admitted to the ICU-stabilized with pressors/D10 infusion/broad-spectrum antibiotics-rapidly improved and subsequently transferred to [facility].  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 12/7>> RUQ ultrasound: Hepatic steatosis 12/7>> renal ultrasound: No hydronephrosis 12/8>> RUQ ultrasound with Doppler: Hepatic steatosis-no hepatic or portal vein thrombosis 12/8>> echo: EF 50-55%, grade 1 diastolic dysfunction, calcified mass on the known coronary cusp of aortic valve with mobile material-could be vegetation or thrombus.  Significant microbiology data: 12/6>> COVID/influenza/RSV PCR: Negative 12/6>> blood culture: No growth 12/6>> acute hepatitis serology: Negative  Procedures: None  Consults: PCCM GI  Subjective: Lying comfortably in bed-denies any chest pain or shortness of breath.  Chronically frail/sick appearing-but no major issues overnight.  Objective: Vitals: Blood pressure 130/66, pulse 61, temperature 97.6 F (36.4 C), temperature source Oral, resp. rate (!) 22, height 5' 10 (1.778 m), weight 74.5 kg, SpO2 97%.   Exam: Gen Exam:Alert  awake-not in any distress.  Chronically sick appearing. HEENT:atraumatic, normocephalic Chest: B/L clear to auscultation anteriorly CVS:S1S2 regular Abdomen:soft non tender, non distended Extremities:no edema Neurology: Generalized weakness Skin: no rash  Pertinent Labs/Radiology:    Latest Ref Rng & Units 04/16/2024    3:45 AM 04/15/2024    2:48 AM 04/14/2024    9:57 AM  CBC  WBC 4.0 - 10.5 K/uL 4.0  5.7  5.1   Hemoglobin 12.0 - 15.0 g/dL 9.4  9.6  9.5   Hematocrit 36.0 - 46.0 % 27.1  28.9  28.8   Platelets 150 - 400 K/uL 34  41    43  41    52     Lab Results  Component Value Date   NA 145 04/16/2024   K 2.8 (L) 04/16/2024   CL 112 (H) 04/16/2024   CO2 17 (L) 04/16/2024      Assessment/Plan: Undifferentiated shock Unclear etiology-initially thought to have septic shock but all cultures negative to date-has rapidly improved-was initially on vancomycin /Zosyn  but all antibiotics were discontinued on 12/7. UA was suggestive of UTI but cultures never sent Echo with some mobile density in the coronary cusp of the aortic valve Will get ID opinion-? Culture neg endocarditis-repeat blood cultures today-this will be 48 hours after last antibiotic dose.  Acute metabolic encephalopathy Secondary to hypothermia/hypoglycemia/undifferentiated shock/acute liver failure Head CT without acute abnormalities Seems to have clinically improved after treatment of underlying etiologies.  Hypothermia Likely secondary to environmental exposure (per prior notes- cool room with single blanket-no heating or light) TSH stable Now normothermic after supportive care  Hypoglycemia Likely secondary to acute liver injury Decrease D10 to 20 cc an hour-encourage oral intake-attempt to titrate off D10 infusion  Acute liver failure Unclear etiology-possibly shock liver from hypotension LFTs/coags improving  Dopplers without portal/hepatic vein thrombosis Acute hepatitis serology negative-however CMV  IgM positive but awaiting viral load. Awaiting anti-smooth muscle antibody as well GI following-on N-acetylcysteine  empirically (possible acetaminophen  exposure)-lactulose   Thrombocytopenia Likely secondary to acute liver injury/failure No bleeding episodes Continue to trend  Normocytic anemia Likely secondary to critical illness Continue to trend/follow CBC  Oropharyngeal dysphagia Secondary to critical illness/encephalopathy SLP following-on dysphagia 2 diet.  AKI Hemodynamically mediated due to hypotension Resolved.  Hypokalemia Replete/recheck  Metabolic acidosis Likely due to delayed clearance of lactic acid-in the setting of liver failure.  Self-limited episode-small-volume coffee-ground emesis Hb stable PPI GI following-no endoscopic procedures planned at this point  Chest pain Likely atypical Troponins minimally elevated-echo with stable EF Supportive care for now  Minimally elevated troponins Trend is flat Likely demand ischemia in the setting of hypotension/critical illness. Echo with able EF  HTN BP stable without use of any antihypertensives.  History of chronic HFpEF Status stable  History of CVA Antiplatelets/statin held-in the setting of elevated liver enzymes and thrombocytopenia.  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 (Active)     Wound 04/14/24 0100 Pressure Injury Buttocks Left Unstageable - Full thickness tissue loss in which the base of the injury is covered by slough  (yellow, tan, gray, green or brown) and/or eschar (tan, brown or black) in the wound bed. (Active)     Wound 04/14/24 0100 Pressure Injury Sacrum Medial Stage 2 -  Partial thickness loss of dermis presenting as a shallow open injury with a red, pink wound bed without slough. (Active)    Code status:   Code Status: Full Code   DVT Prophylaxis: SCDs Start: 04/13/24 2344   Family Communication: None at bedside   Disposition Plan: Status is: Inpatient Remains inpatient appropriate because: Severity of illness   Planned Discharge Destination:Skilled nursing facility   Diet: Diet Order             DIET DYS 2 Room service appropriate? No; Fluid consistency: Nectar Thick  Diet effective now                     Antimicrobial agents: Anti-infectives (From admission, onward)    Start     Dose/Rate Route Frequency Ordered Stop   04/14/24 0600  piperacillin -tazobactam (ZOSYN ) IVPB 3.375 g  Status:  Discontinued        3.375 g 12.5 mL/hr over 240 Minutes Intravenous Every 8 hours 04/14/24 0132 04/15/24 0920   04/13/24 2045  ceFEPIme  (MAXIPIME ) 2 g in sodium chloride  0.9 % 100 mL IVPB        2 g 200 mL/hr over 30 Minutes Intravenous  Once 04/13/24 2030 04/13/24 2147   04/13/24 2045  metroNIDAZOLE  (FLAGYL ) IVPB 500 mg        500 mg 100 mL/hr over 60 Minutes Intravenous  Once 04/13/24 2030 04/13/24 2310   04/13/24 2045  vancomycin  (VANCOCIN ) IVPB 1000 mg/200 mL premix  Status:  Discontinued        1,000 mg 200 mL/hr over 60 Minutes Intravenous  Once 04/13/24 2030 04/13/24 2032  04/13/24 2045  vancomycin  (VANCOREADY) IVPB 1750 mg/350 mL        1,750 mg 175 mL/hr over 120 Minutes Intravenous  Once 04/13/24 2032 04/13/24 2349        MEDICATIONS: Scheduled Meds:  Chlorhexidine  Gluconate Cloth  6 each Topical Daily   feeding supplement  237 mL Oral BID BM   lactulose   20 g Oral TID   mouth rinse  15 mL Mouth Rinse 4 times per day   pantoprazole  (PROTONIX ) IV  40 mg  Intravenous Q12H   Continuous Infusions:  acetylcysteine  6.25 mg/kg/hr (04/16/24 0639)   dextrose  40 mL/hr at 04/16/24 9376   magnesium  sulfate bolus IVPB 2 g (04/16/24 0822)   phytonadione  (VITAMIN K ) 10 mg in dextrose  5 % 50 mL IVPB 10 mg (04/16/24 0908)   potassium chloride  10 mEq (04/16/24 0818)   PRN Meds:.docusate, ipratropium-albuterol , ondansetron  (ZOFRAN ) IV, mouth rinse, mouth rinse, polyethylene glycol   I have personally reviewed following labs and imaging studies  LABORATORY DATA: CBC: Recent Labs  Lab 04/13/24 2123 04/13/24 2132 04/14/24 0957 04/15/24 0248 04/16/24 0345  WBC 6.8  --  5.1 5.7 4.0  NEUTROABS 6.2  --   --   --   --   HGB 11.9* 12.6  13.3 9.5* 9.6* 9.4*  HCT 36.4 37.0  39.0 28.8* 28.9* 27.1*  MCV 90.5  --  90.9 89.8 88.3  PLT 56*  --  52*  41* 43*  41* 34*    Basic Metabolic Panel: Recent Labs  Lab 04/13/24 2308 04/14/24 0957 04/14/24 1958 04/15/24 0248 04/16/24 0345  NA 144 146* 143 145 145  K 2.7* 2.7* 3.5 4.9 2.8*  CL 108 110 110 114* 112*  CO2 19* 18* 20* 21* 17*  GLUCOSE 81 88 167* 147* 131*  BUN 45* 42* 40* 38* 23  CREATININE 1.96* 1.59* 1.43* 1.27* 0.80  CALCIUM  7.5* 7.4* 7.4* 7.4* 8.0*  MG  --  1.7  --  1.7 1.8    GFR: Estimated Creatinine Clearance: 60.7 mL/min (by C-G formula based on SCr of 0.8 mg/dL).  Liver Function Tests: Recent Labs  Lab 04/13/24 2308 04/14/24 0957 04/14/24 1958 04/15/24 0248 04/16/24 0345  AST 3,489* 2,619* 1,795* 1,475* 548*  ALT 1,723* 1,437* 1,236* 1,157* 734*  ALKPHOS 85 82 82 90 93  BILITOT 2.4* 2.2* 2.0* 2.0* 1.8*  PROT 5.3* 5.4* 5.3* 5.5* 6.0*  ALBUMIN  2.1* 1.9* 1.9* 1.8* 2.3*   Recent Labs  Lab 04/13/24 2123  LIPASE 106*   Recent Labs  Lab 04/14/24 0005 04/14/24 0957  AMMONIA 50* 60*    Coagulation Profile: Recent Labs  Lab 04/13/24 2123 04/14/24 0957 04/15/24 0248 04/15/24 1617 04/16/24 0345  INR 2.3* 2.6* 1.9* 1.4* 1.5*    Cardiac Enzymes: Recent Labs   Lab 04/15/24 0955  CKTOTAL 232    BNP (last 3 results) Recent Labs    01/11/24 2300  PROBNP 3,648.0*    Lipid Profile: No results for input(s): CHOL, HDL, LDLCALC, TRIG, CHOLHDL, LDLDIRECT in the last 72 hours.  Thyroid  Function Tests: Recent Labs    04/14/24 0003 04/14/24 0149  TSH 0.591  --   FREET4  --  0.97    Anemia Panel: No results for input(s): VITAMINB12, FOLATE, FERRITIN, TIBC, IRON, RETICCTPCT in the last 72 hours.  Urine analysis:    Component Value Date/Time   COLORURINE AMBER (A) 04/13/2024 2041   APPEARANCEUR CLOUDY (A) 04/13/2024 2041   LABSPEC 1.025 04/13/2024 2041   PHURINE 5.0 04/13/2024 2041  GLUCOSEU NEGATIVE 04/13/2024 2041   HGBUR SMALL (A) 04/13/2024 2041   BILIRUBINUR SMALL (A) 04/13/2024 2041   KETONESUR NEGATIVE 04/13/2024 2041   PROTEINUR 100 (A) 04/13/2024 2041   UROBILINOGEN 1.0 11/15/2010 2015   NITRITE NEGATIVE 04/13/2024 2041   LEUKOCYTESUR NEGATIVE 04/13/2024 2041    Sepsis Labs: Lactic Acid, Venous    Component Value Date/Time   LATICACIDVEN 4.5 (HH) 04/15/2024 1538    MICROBIOLOGY: Recent Results (from the past 240 hours)  MRSA Next Gen by PCR, Nasal     Status: None   Collection Time: 04/13/24 12:51 AM   Specimen: Nasal Mucosa; Nasal Swab  Result Value Ref Range Status   MRSA by PCR Next Gen NOT DETECTED NOT DETECTED Final    Comment: (NOTE) The GeneXpert MRSA Assay (FDA approved for NASAL specimens only), is one component of a comprehensive MRSA colonization surveillance program. It is not intended to diagnose MRSA infection nor to guide or monitor treatment for MRSA infections. Test performance is not FDA approved in patients less than 47 years old. Performed at [facility], [address] [place], [place] [address]   Resp panel by RT-PCR (RSV, Flu A&B, Covid) Anterior Nasal Swab     Status: None   Collection Time: 04/13/24  8:30 PM   Specimen: Anterior Nasal Swab  Result  Value Ref Range Status   SARS Coronavirus 2 by RT PCR NEGATIVE NEGATIVE Final   Influenza A by PCR NEGATIVE NEGATIVE Final   Influenza B by PCR NEGATIVE NEGATIVE Final    Comment: (NOTE) The Xpert Xpress SARS-CoV-2/FLU/RSV plus assay is intended as an aid in the diagnosis of influenza from Nasopharyngeal swab specimens and should not be used as a sole basis for treatment. Nasal washings and aspirates are unacceptable for Xpert Xpress SARS-CoV-2/FLU/RSV testing.  Fact Sheet for Patients: bloggercourse.com  Fact Sheet for Healthcare Providers: seriousbroker.it  This test is not yet approved or cleared by the [place]  FDA and has been authorized for detection and/or diagnosis of SARS-CoV-2 by FDA under an Emergency Use Authorization (EUA). This EUA will remain in effect (meaning this test can be used) for the duration of the COVID-19 declaration under Section 564(b)(1) of the Act, 21 U.S.C. section 360bbb-3(b)(1), unless the authorization is terminated or revoked.     Resp Syncytial Virus by PCR NEGATIVE NEGATIVE Final    Comment: (NOTE) Fact Sheet for Patients: bloggercourse.com  Fact Sheet for Healthcare Providers: seriousbroker.it  This test is not yet approved or cleared by the [place]  FDA and has been authorized for detection and/or diagnosis of SARS-CoV-2 by FDA under an Emergency Use Authorization (EUA). This EUA will remain in effect (meaning this test can be used) for the duration of the COVID-19 declaration under Section 564(b)(1) of the Act, 21 U.S.C. section 360bbb-3(b)(1), unless the authorization is terminated or revoked.  Performed at [facility], [address] [place], [place] [address]   Blood Culture (routine x 2)     Status: None (Preliminary result)   Collection Time: 04/13/24  8:30 PM   Specimen: BLOOD  Result Value Ref Range Status    Specimen Description BLOOD SITE NOT SPECIFIED  Final   Special Requests   Final    BOTTLES DRAWN AEROBIC AND ANAEROBIC Blood Culture adequate volume   Culture   Final    NO GROWTH 3 DAYS Performed at [facility], [address] [place], [place] [address]    Report Status PENDING  Incomplete  Blood Culture (routine x 2)     Status: None (Preliminary result)   Collection Time: 04/13/24  8:35 PM   Specimen: BLOOD LEFT FOREARM  Result Value Ref Range Status   Specimen Description BLOOD LEFT FOREARM  Final   Special Requests   Final    BOTTLES DRAWN AEROBIC AND ANAEROBIC Blood Culture results may not be optimal due to an inadequate volume of blood received in culture bottles   Culture   Final    NO GROWTH 3 DAYS Performed at [facility], [address], [place], [place] [address]    Report Status PENDING  Incomplete    RADIOLOGY STUDIES/RESULTS: US  ABD LTD RUG W/LIVER DOPPLER Result Date: 04/16/2024 EXAM: RIGHT UPPER QUADRANT ULTRASOUND WITH DOPPLER EVALUATION 04/15/2024 11:50:00 PM COMPARISON: Right upper quadrant ultrasound 04/14/2024. CT 04/13/2024. CLINICAL HISTORY: Elevated LFTs. FINDINGS: LIVER: Increased echotexture throughout the liver is compatible with fatty infiltration. No evidence of intrahepatic biliary ductal dilatation. OTHER: No evidence of right upper quadrant ascites. Vascular/Doppler: Doppler interrogation of the abdominal vasculature was performed. There is normal Doppler flow in the normal direction of the hepatic vasculature. Normal hepatopetal flow of the hepatic artery and portal vein. Normal hepatofugal flow of the hepatic veins. Normal Doppler waveforms are visualized. IMPRESSION: 1. Hepatic steatosis. 2. No evidence of hepatic vein or portal vein thrombosis. Electronically signed by: Franky Crease MD 04/16/2024 01:23 AM EST RP Workstation: [id number]   DG Swallowing Func-Speech Pathology Result Date: 04/15/2024 Table formatting from the original result  was not included. Modified Barium Swallow Study Patient Details Name: Milagros Middendorf MRN: [id number] Date of Birth: Sep 06, 1945 Today's Date: 04/15/2024 HPI/PMH: HPI: Ms. Stonerock is a 78 y/o female presenting 04/14/24 after roommate called EMS as the patient was unresponsive. She reports chest pain and was very confused. Pt found under single blanket, alone with no heat or lights on in home.   PMHx:  HTN, HLD, CVA left hemiparesis, CAPD, DVT, IVH. UA positive. SLP consulted for clinical swallow assessment. Clinical Impression: Pt presents with a mild-moderate oropharyngeal dysphagia per MBSS completed today. Recommend a mechanical altered/ground diet with nectar-thick liquids (straw ok), and meds crushed in applesauce. Oral deficits included reduced oral strength and coordination resulting in intermittent anterior loss of liquids by spoon or cup sip. Pt impulsively drank all liquids and was unable to try single sips despite cues. There was consistent posterior loss of thin and nectar-thick liquids to the level of the pyriform sinuses pre swallow with some pooling. There was piecemeal swallow of pudding and softened cracker. Min cracker residue on hard palate cleared with prompted nectar-thick liquid wash. Pharyngeal deficits included reduced base of tongue retraction, reduced laryngeal vestibule closure, reduced pharyngeal stripping, and reduced distention of UES. Findings -There was transient aspiration of thin liquid residue during cleansing swallows. Residue was ejected to the level of the vocal cords (PAS-5) or the laryngeal vestibule (PAS-3) during swallow, which was inaudible. No stagnant aspiration in the airway observed. -There x1 instance of stagnant penetration of nectar-thick liquid wash post cracker. Penetrated residue did start to progress to level of the vocal cords (PAS3 - 5); though ejected with cued throat clear + swallow. All other penetration instances of nectar-thick liquids were transient. -There was min  diffuse pharyngeal residue following liquids and pudding. A moderate amount of pharyngeal residue observed post cracker trial, which was cleared with cued dry swallow. Plan: SLP will follow up to assess diet tolerance and modify diet as indicated. Pt may benefit from a repeat  MBSS prior to liquid advancement given that inaudible responses to penetration/aspiration events. Factors that may increase risk of adverse event in presence of aspiration Noe & Lianne 2021): Factors that may increase risk of adverse event in presence of aspiration Noe & Lianne 2021): Reduced cognitive function; Limited mobility; Frail or deconditioned; Dependence for feeding and/or oral hygiene; Inadequate oral hygiene; Reduced saliva; Weak cough Recommendations/Plan: Swallowing Evaluation Recommendations Swallowing Evaluation Recommendations Recommendations: PO diet PO Diet Recommendation: Dysphagia 2 (Finely chopped); Mildly thick liquids (Level 2, nectar thick) Liquid Administration via: Cup; Straw Medication Administration: Crushed with puree Supervision: Staff to assist with self-feeding; Full supervision/cueing for swallowing strategies Swallowing strategies  : Minimize environmental distractions; Slow rate; Small bites/sips; Clear throat intermittently; Follow solids with liquids Postural changes: Position pt fully upright for meals Oral care recommendations: Oral care BID (2x/day) Treatment Plan Treatment Plan Treatment recommendations: Therapy as outlined in treatment plan below Follow-up recommendations: Skilled nursing-short term rehab (<3 hours/day) Functional status assessment: Patient has had a recent decline in their functional status and demonstrates the ability to make significant improvements in function in a reasonable and predictable amount of time. Treatment frequency: Min 1x/week Treatment duration: 2 weeks Interventions: Diet toleration management by SLP; Trials of upgraded texture/liquids; Patient/family  education; Compensatory techniques Recommendations Recommendations for follow up therapy are one component of a multi-disciplinary discharge planning process, led by the attending physician.  Recommendations may be updated based on patient status, additional functional criteria and insurance authorization. Assessment: Orofacial Exam: Orofacial Exam Oral Cavity: Oral Hygiene: Dried secretions Oral Cavity - Dentition: Poor condition; Missing dentition Orofacial Anatomy: WFL Oral Motor/Sensory Function: Unable to test Anatomy: Anatomy: WFL Boluses Administered: Boluses Administered Boluses Administered: Thin liquids (Level 0); Mildly thick liquids (Level 2, nectar thick); Puree; Solid  Oral Impairment Domain: Oral Impairment Domain Lip Closure: Escape from interlabial space or lateral juncture, no extension beyond vermillion border Tongue control during bolus hold: Posterior escape of greater than half of bolus Bolus preparation/mastication: Disorganized chewing/mashing with solid pieces of bolus unchewed Bolus transport/lingual motion: Slow tongue motion Oral residue: Residue collection on oral structures Location of oral residue : Tongue Initiation of pharyngeal swallow : Pyriform sinuses  Pharyngeal Impairment Domain: Pharyngeal Impairment Domain Soft palate elevation: No bolus between soft palate (SP)/pharyngeal wall (PW) Laryngeal elevation: Complete superior movement of thyroid  cartilage with complete approximation of arytenoids to epiglottic petiole Anterior hyoid excursion: Complete anterior movement Epiglottic movement: Complete inversion Laryngeal vestibule closure: Incomplete, narrow column air/contrast in laryngeal vestibule Pharyngeal stripping wave : Present - diminished Pharyngeal contraction (A/P view only): N/A Pharyngoesophageal segment opening: Partial distention/partial duration, partial obstruction of flow Tongue base retraction: Narrow column of contrast or air between tongue base and PPW  Pharyngeal residue: Collection of residue within or on pharyngeal structures Location of pharyngeal residue: Tongue base; Valleculae; Pyriform sinuses  Esophageal Impairment Domain: Esophageal Impairment Domain Esophageal clearance upright position: Esophageal retention Pill: No data recorded Penetration/Aspiration Scale Score: Penetration/Aspiration Scale Score 1.  Material does not enter airway: Puree; Solid 2.  Material enters airway, remains ABOVE vocal cords then ejected out: Mildly thick liquids (Level 2, nectar thick) 3.  Material enters airway, remains ABOVE vocal cords and not ejected out: Mildly thick liquids (Level 2, nectar thick); Thin liquids (Level 0) 5.  Material enters airway, CONTACTS cords and not ejected out: Thin liquids (Level 0) 6.  Material enters airway, passes BELOW cords then ejected out: Thin liquids (Level 0) Compensatory Strategies: Compensatory Strategies Compensatory strategies: Yes Multiple swallows: Effective (when  pt able to follow cue) Effective Multiple Swallows: Solid Chin tuck: -- (unable to attempt) Other(comment): Effective (throat clear+ swallow to eject penetration) Effective Other(comment): Mildly thick liquid (Level 2, nectar thick)   General Information: Caregiver present: No  Diet Prior to this Study: NPO   Temperature : Normal   Respiratory Status: WFL   Supplemental O2: None (Room air)   History of Recent Intubation: No  Behavior/Cognition: Alert; Cooperative [facility]) Self-Feeding Abilities: Needs set-up for self-feeding Baseline vocal quality/speech: Hypophonia/low volume Volitional Cough: Unable to elicit (occasional throat clear elicited) Volitional Swallow: Able to elicit (delayed) No data recorded Goal Planning: Prognosis for improved oropharyngeal function: Fair No data recorded No data recorded Patient/Family Stated Goal: none stated Consulted and agree with results and recommendations: Patient; Nurse Pain: Pain Assessment Pain Assessment: No/denies pain Pain  Score: 0 Faces Pain Scale: 2 Facial Expression: 0 Body Movements: 0 Muscle Tension: 0 Compliance with ventilator (intubated pts.): N/A Vocalization (extubated pts.): 0 CPOT Total: 0 Pain Location: grimacing with mobility tasks Pain Descriptors / Indicators: Grimacing End of Session: Start Time:SLP Start Time (ACUTE ONLY): 1438 Stop Time: SLP Stop Time (ACUTE ONLY): 1500 Time Calculation:SLP Time Calculation (min) (ACUTE ONLY): 22 min Charges: SLP Evaluations $ SLP Speech Visit: 1 Visit SLP Evaluations $MBS Swallow: 1 Procedure SLP visit diagnosis: SLP Visit Diagnosis: Dysphagia, oropharyngeal phase (R13.12) Past Medical History: Past Medical History: Diagnosis Date  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 (HCC) 09/16/2020  COPD (chronic obstructive pulmonary disease) (HCC)   History of DVT (deep vein thrombosis)   Hypertension   Thoracoabdominal aortic aneurysm   TIA (transient ischemic attack) 09/10/2020 Past Surgical History: Past Surgical History: Procedure Laterality Date  IR BONE MARROW BIOPSY & ASPIRATION  12/13/2023  IR CT HEAD LTD  09/11/2020  IR INTRAVSC STENT CERV CAROTID W/O EMB-PROT MOD SED INC ANGIO  09/11/2020  IR PERCUTANEOUS ART THROMBECTOMY/INFUSION INTRACRANIAL INC DIAG ANGIO  09/11/2020     IR PERCUTANEOUS ART THROMBECTOMY/INFUSION INTRACRANIAL INC DIAG ANGIO  09/11/2020  IR US  GUIDE VASC ACCESS RIGHT  09/11/2020  NO PAST SURGERIES    RADIOLOGY WITH ANESTHESIA N/A 09/11/2020  Procedure: IR WITH ANESTHESIA;  Surgeon: Dolphus Carrion, MD;  Location: [facility] OR;  Service: Radiology;  Laterality: N/A; [name] 04/15/2024, 3:33 PM  ECHOCARDIOGRAM COMPLETE Result Date: 04/15/2024    ECHOCARDIOGRAM REPORT   Patient Name:   KATALYNA SOCARRAS Date of Exam: 04/15/2024 Medical Rec #:  [id number]   Height:       70.0 in Accession #:    [id number]  Weight:       164.2 lb Date of Birth:  1946/04/20  BSA:          1.920 m Patient Age:    78 years    BP:           134/57 mmHg  Patient Gender: F           HR:           56 bpm. Exam Location:  Inpatient Procedure: 2D Echo, Cardiac Doppler and Color Doppler (Both Spectral and Color            Flow Doppler were utilized during procedure). Indications:    Shock  History:        Patient has prior history of Echocardiogram examinations, most                 recent 01/12/2024. Stroke, Signs/Symptoms:Altered Mental Status;  Risk Factors:Dyslipidemia and Hypertension. ICH.  Sonographer:    Ellouise Mose RDCS Referring Phys: [phone] RAKESH V ALVA IMPRESSIONS  1. Left ventricular ejection fraction, by estimation, is 50 to 55%. Left ventricular ejection fraction by 2D MOD biplane is 51.5 %. Left ventricular ejection fraction by PLAX is 43 %. The left ventricle has low normal function. The left ventricle demonstrates global hypokinesis. There is moderate left ventricular hypertrophy. Left ventricular diastolic parameters are consistent with Grade I diastolic dysfunction (impaired relaxation).  2. Right ventricular systolic function is normal. The right ventricular size is normal. Tricuspid regurgitation signal is inadequate for assessing PA pressure.  3. The mitral valve is degenerative. Trivial mitral valve regurgitation. No evidence of mitral stenosis.  4. Calcified mass on the non-coronary cusp with some mobile material, measures 0.5 x 1.0 cm, could be old vegetation or thrombus. The aortic valve is tricuspid. There is severe thickening of the aortic valve. Aortic valve regurgitation is not visualized. Aortic valve sclerosis/calcification is present, without any evidence of aortic stenosis.  5. Aortic dilatation noted. There is mild dilatation of the ascending aorta, measuring 40 mm.  6. The inferior vena cava is dilated in size with <50% respiratory variability, suggesting right atrial pressure of 15 mmHg. Comparison(s): Changes from prior study are noted. 01/12/2024: LVEF 60-65%. FINDINGS  Left Ventricle: Left ventricular ejection fraction, by  estimation, is 50 to 55%. Left ventricular ejection fraction by PLAX is 43 %. Left ventricular ejection fraction by 2D MOD biplane is 51.5 %. The left ventricle has low normal function. The left ventricle demonstrates global hypokinesis. 3D ejection fraction reviewed and evaluated as part of the interpretation. Alternate measurement of EF is felt to be most reflective of LV function. The left ventricular internal cavity size was normal in size. There is moderate left ventricular hypertrophy. Left ventricular diastolic parameters are consistent with Grade I diastolic dysfunction (impaired relaxation). Indeterminate filling pressures. Right Ventricle: The right ventricular size is normal. No increase in right ventricular wall thickness. Right ventricular systolic function is normal. Tricuspid regurgitation signal is inadequate for assessing PA pressure. Left Atrium: Left atrial size was normal in size. Right Atrium: Right atrial size was normal in size. Pericardium: There is no evidence of pericardial effusion. Mitral Valve: The mitral valve is degenerative in appearance. Mild to moderate mitral annular calcification. Trivial mitral valve regurgitation. No evidence of mitral valve stenosis. Tricuspid Valve: The tricuspid valve is normal in structure. Tricuspid valve regurgitation is not demonstrated. No evidence of tricuspid stenosis. Aortic Valve: Calcified mass on the non-coronary cusp with some mobile material, measures 0.5 x 1.0 cm, could be old vegetation or thrombus. The aortic valve is tricuspid. There is severe thickening of the aortic valve. Aortic valve regurgitation is not visualized. Aortic valve sclerosis/calcification is present, without any evidence of aortic stenosis. Pulmonic Valve: The pulmonic valve was normal in structure. Pulmonic valve regurgitation is not visualized. No evidence of pulmonic stenosis. Aorta: Aortic dilatation noted. There is mild dilatation of the ascending aorta, measuring 40  mm. Venous: The inferior vena cava is dilated in size with less than 50% respiratory variability, suggesting right atrial pressure of 15 mmHg. IAS/Shunts: No atrial level shunt detected by color flow Doppler. Additional Comments: 3D was performed not requiring image post processing on an independent workstation and was abnormal.  LEFT VENTRICLE PLAX 2D                        Biplane EF (MOD) LV EF:  Left            LV Biplane EF:   Left                ventricular                      ventricular                ejection                         ejection                fraction by                      fraction by                PLAX is 43                       2D MOD                %.                               biplane is LVIDd:         4.70 cm                          51.5 %. LVIDs:         3.70 cm LV PW:         1.50 cm         Diastology LV IVS:        1.50 cm         LV e' medial:    3.81 cm/s LVOT diam:     2.30 cm         LV E/e' medial:  11.5 LV SV:         87              LV e' lateral:   3.70 cm/s LV SV Index:   45              LV E/e' lateral: 11.9 LVOT Area:     4.15 cm  LV Volumes (MOD) LV vol d, MOD    96.7 ml       3D Volume EF: A2C:                           3D EF:        46 % LV vol d, MOD    81.7 ml       LV EDV:       174 ml A4C:                           LV ESV:       95 ml LV vol s, MOD    44.3 ml       LV SV:        79 ml A2C: LV vol s, MOD    42.3 ml A4C: LV SV MOD A2C:   52.4 ml LV SV MOD A4C:   81.7 ml LV SV MOD BP:    46.9 ml RIGHT VENTRICLE  IVC RV S prime:     17.70 cm/s  IVC diam: 2.10 cm TAPSE (M-mode): 1.9 cm                             PULMONARY VEINS                             Diastolic Velocity: 27.90 cm/s                             S/D Velocity:       1.60                             Systolic Velocity:  44.10 cm/s LEFT ATRIUM             Index        RIGHT ATRIUM           Index LA diam:        3.90 cm 2.03 cm/m   RA Area:     11.40 cm LA Vol (A2C):   39.3 ml  20.47 ml/m  RA Volume:   24.10 ml  12.55 ml/m LA Vol (A4C):   42.8 ml 22.30 ml/m LA Biplane Vol: 41.7 ml 21.72 ml/m  AORTIC VALVE LVOT Vmax:   100.00 cm/s LVOT Vmean:  58.600 cm/s LVOT VTI:    0.210 m  AORTA Ao Root diam: 3.30 cm Ao Asc diam:  3.95 cm MITRAL VALVE MV Area (PHT): 2.32 cm    SHUNTS MV Decel Time: 327 msec    Systemic VTI:  0.21 m MV E velocity: 44.00 cm/s  Systemic Diam: 2.30 cm MV A velocity: 89.70 cm/s MV E/A ratio:  0.49 Vinie Maxcy MD Electronically signed by Vinie Maxcy MD Signature Date/Time: 04/15/2024/3:29:26 PM    Final    DG Abd Portable 1V Result Date: 04/14/2024 EXAM: 1 VIEW XRAY OF THE ABDOMEN 04/14/2024 06:02:00 PM COMPARISON: Same day knees. CLINICAL HISTORY: Confusion. FINDINGS: LINES, TUBES AND DEVICES: Nasogastric tube tip has been advanced into body of stomach and is in grossly good position. BOWEL: Nonobstructive bowel gas pattern. SOFT TISSUES: No abnormal calcifications. BONES: No acute fracture. IMPRESSION: 1. Nasogastric tube tip projects over the body of the stomach in expected position. Electronically signed by: Lynwood Seip MD 04/14/2024 06:13 PM EST RP Workstation: [id number]   DG Abd 1 View Result Date: 04/14/2024 EXAM: 1 VIEW XRAY OF THE ABDOMEN 04/14/2024 12:00:00 PM COMPARISON: None available. CLINICAL HISTORY: Nasogastric tube present. FINDINGS: LINES, TUBES AND DEVICES: Enteric tube in place with tip projecting over the stomach. Side port projecting over the distal esophagus. BOWEL: Nonobstructive bowel gas pattern. SOFT TISSUES: Atherosclerotic calcifications and abdominal aortic aneurysm. BONES: No acute fracture. IMPRESSION: 1. Enteric tube in place with tip projecting over the stomach and side port projecting over the distal esophagus. Recommend advancing the tube so the side port is beyond the gastroesophageal junction. 2. Abdominal aortic aneurysm with atherosclerotic calcifications. This is described in greater detail on CT of the abdomen and  pelvis without contrast 04/13/2024 . Electronically signed by: Lonni Necessary MD 04/14/2024 04:27 PM EST RP Workstation: [id number]   US  RENAL Result Date: 04/14/2024 EXAM: US  Retroperitoneum Complete, Renal. 04/14/2024 12:20:00 PM TECHNIQUE: Real-time ultrasonography of the retroperitoneum renal was performed. COMPARISON: None available CLINICAL HISTORY: Acute kidney failure. FINDINGS:  FINDINGS: RIGHT KIDNEY/URETER: Right kidney measures 10.5 x 5.5 x 5.0 cm. Normal cortical echogenicity. No hydronephrosis. No calculus. No mass. LEFT KIDNEY/URETER: Left kidney measures 10.5 x 5.3 x 4.9 cm. Normal cortical echogenicity. No hydronephrosis. No calculus. No mass. BLADDER: Urinary bladder is decompressed secondary to foley catheter. IMPRESSION: 1. No acute findings. Electronically signed by: Lynwood Seip MD 04/14/2024 12:50 PM EST RP Workstation: [id number]     LOS: 3 days   Donalda Applebaum, MD  [facility]    To contact the attending provider between 7A-7P or the covering provider during after hours 7P-7A, please log into the web site [email or web address] and access using universal [place] password for that web site. If you do not have the password, please call the hospital operator.  04/16/2024, 9:22 AM

## 2024-04-17 ENCOUNTER — Encounter (HOSPITAL_COMMUNITY): Admission: EM | Payer: Self-pay | Source: Home / Self Care | Attending: Internal Medicine

## 2024-04-17 DIAGNOSIS — R4182 Altered mental status, unspecified: Secondary | ICD-10-CM

## 2024-04-17 DIAGNOSIS — Z8673 Personal history of transient ischemic attack (TIA), and cerebral infarction without residual deficits: Secondary | ICD-10-CM | POA: Diagnosis not present

## 2024-04-17 DIAGNOSIS — Z8719 Personal history of other diseases of the digestive system: Secondary | ICD-10-CM

## 2024-04-17 DIAGNOSIS — E538 Deficiency of other specified B group vitamins: Secondary | ICD-10-CM | POA: Diagnosis not present

## 2024-04-17 DIAGNOSIS — D649 Anemia, unspecified: Secondary | ICD-10-CM | POA: Diagnosis not present

## 2024-04-17 DIAGNOSIS — E43 Unspecified severe protein-calorie malnutrition: Secondary | ICD-10-CM | POA: Insufficient documentation

## 2024-04-17 LAB — CBC WITH DIFFERENTIAL/PLATELET
Abs Immature Granulocytes: 0.04 K/uL (ref 0.00–0.07)
Basophils Absolute: 0 K/uL (ref 0.0–0.1)
Basophils Relative: 0 %
Eosinophils Absolute: 0.3 K/uL (ref 0.0–0.5)
Eosinophils Relative: 6 %
HCT: 26.4 % — ABNORMAL LOW (ref 36.0–46.0)
Hemoglobin: 9 g/dL — ABNORMAL LOW (ref 12.0–15.0)
Immature Granulocytes: 1 %
Lymphocytes Relative: 37 %
Lymphs Abs: 1.6 K/uL (ref 0.7–4.0)
MCH: 30.1 pg (ref 26.0–34.0)
MCHC: 34.1 g/dL (ref 30.0–36.0)
MCV: 88.3 fL (ref 80.0–100.0)
Monocytes Absolute: 0.3 K/uL (ref 0.1–1.0)
Monocytes Relative: 6 %
Neutro Abs: 2.3 K/uL (ref 1.7–7.7)
Neutrophils Relative %: 50 %
Platelets: 18 K/uL — CL (ref 150–400)
RBC: 2.99 MIL/uL — ABNORMAL LOW (ref 3.87–5.11)
RDW: 15.8 % — ABNORMAL HIGH (ref 11.5–15.5)
Smear Review: NORMAL
WBC: 4.5 K/uL (ref 4.0–10.5)
nRBC: 0.9 % — ABNORMAL HIGH (ref 0.0–0.2)

## 2024-04-17 LAB — TECHNOLOGIST SMEAR REVIEW: Plt Morphology: NORMAL

## 2024-04-17 LAB — CBC
HCT: 27.8 % — ABNORMAL LOW (ref 36.0–46.0)
Hemoglobin: 9.3 g/dL — ABNORMAL LOW (ref 12.0–15.0)
MCH: 29.7 pg (ref 26.0–34.0)
MCHC: 33.5 g/dL (ref 30.0–36.0)
MCV: 88.8 fL (ref 80.0–100.0)
Platelets: 53 K/uL — ABNORMAL LOW (ref 150–400)
RBC: 3.13 MIL/uL — ABNORMAL LOW (ref 3.87–5.11)
RDW: 16 % — ABNORMAL HIGH (ref 11.5–15.5)
WBC: 4.2 K/uL (ref 4.0–10.5)
nRBC: 0.5 % — ABNORMAL HIGH (ref 0.0–0.2)

## 2024-04-17 LAB — COMPREHENSIVE METABOLIC PANEL WITH GFR
ALT: 481 U/L — ABNORMAL HIGH (ref 0–44)
AST: 219 U/L — ABNORMAL HIGH (ref 15–41)
Albumin: 1.9 g/dL — ABNORMAL LOW (ref 3.5–5.0)
Alkaline Phosphatase: 104 U/L (ref 38–126)
Anion gap: 4 — ABNORMAL LOW (ref 5–15)
BUN: 15 mg/dL (ref 8–23)
CO2: 28 mmol/L (ref 22–32)
Calcium: 7.9 mg/dL — ABNORMAL LOW (ref 8.9–10.3)
Chloride: 110 mmol/L (ref 98–111)
Creatinine, Ser: 0.56 mg/dL (ref 0.44–1.00)
GFR, Estimated: >60 mL/min (ref >60)
Glucose, Bld: 104 mg/dL — ABNORMAL HIGH (ref 70–99)
Potassium: 2.9 mmol/L — ABNORMAL LOW (ref 3.5–5.1)
Sodium: 142 mmol/L (ref 135–145)
Total Bilirubin: 1.2 mg/dL (ref 0.0–1.2)
Total Protein: 5.3 g/dL — ABNORMAL LOW (ref 6.5–8.1)

## 2024-04-17 LAB — TYPE AND SCREEN
ABO/RH(D): B POS
Antibody Screen: NEGATIVE

## 2024-04-17 LAB — DIC (DISSEMINATED INTRAVASCULAR COAGULATION)PANEL
D-Dimer, Quant: 19.61 ug{FEU}/mL — ABNORMAL HIGH (ref 0.00–0.50)
Fibrinogen: 216 mg/dL (ref 210–475)
INR: 1.3 — ABNORMAL HIGH (ref 0.8–1.2)
Platelets: 17 K/uL — CL (ref 150–400)
Prothrombin Time: 16.4 s — ABNORMAL HIGH (ref 11.4–15.2)
Smear Review: NONE SEEN
aPTT: 36 s (ref 24–36)

## 2024-04-17 LAB — CK: Total CK: 91 U/L (ref 38–234)

## 2024-04-17 LAB — GLUCOSE, CAPILLARY
Glucose-Capillary: 102 mg/dL — ABNORMAL HIGH (ref 70–99)
Glucose-Capillary: 104 mg/dL — ABNORMAL HIGH (ref 70–99)
Glucose-Capillary: 115 mg/dL — ABNORMAL HIGH (ref 70–99)
Glucose-Capillary: 121 mg/dL — ABNORMAL HIGH (ref 70–99)
Glucose-Capillary: 85 mg/dL (ref 70–99)
Glucose-Capillary: 97 mg/dL (ref 70–99)

## 2024-04-17 LAB — PROTIME-INR
INR: 1.3 — ABNORMAL HIGH (ref 0.8–1.2)
Prothrombin Time: 17.2 s — ABNORMAL HIGH (ref 11.4–15.2)

## 2024-04-17 LAB — IMMATURE PLATELET FRACTION: Immature Platelet Fraction: 6.1 % (ref 1.2–8.6)

## 2024-04-17 LAB — MAGNESIUM: Magnesium: 2.1 mg/dL (ref 1.7–2.4)

## 2024-04-17 LAB — PHOSPHORUS: Phosphorus: <1 mg/dL — CL (ref 2.5–4.6)

## 2024-04-17 SURGERY — TRANSESOPHAGEAL ECHOCARDIOGRAM (TEE) (CATHLAB)
Anesthesia: Monitor Anesthesia Care

## 2024-04-17 MED ORDER — MORPHINE SULFATE (PF) 2 MG/ML IV SOLN
1.0000 mg | INTRAVENOUS | Status: DC | PRN
  Administered 2024-04-17 – 2024-04-24 (×16): 1 mg via INTRAVENOUS
  Filled 2024-04-17 (×16): qty 1

## 2024-04-17 MED ORDER — POTASSIUM PHOSPHATES 15 MMOLE/5ML IV SOLN
45.0000 mmol | Freq: Once | INTRAVENOUS | Status: AC
  Administered 2024-04-17: 45 mmol via INTRAVENOUS
  Filled 2024-04-17: qty 15

## 2024-04-17 MED ORDER — ADULT MULTIVITAMIN W/MINERALS CH
1.0000 | ORAL_TABLET | Freq: Every day | ORAL | Status: DC
  Administered 2024-04-18 – 2024-05-06 (×17): 1 via ORAL
  Filled 2024-04-17 (×17): qty 1

## 2024-04-17 MED ORDER — THIAMINE MONONITRATE 100 MG PO TABS
100.0000 mg | ORAL_TABLET | Freq: Every day | ORAL | Status: AC
  Administered 2024-04-18 – 2024-04-24 (×7): 100 mg via ORAL
  Filled 2024-04-17 (×7): qty 1

## 2024-04-17 MED ORDER — SODIUM CHLORIDE 0.9% IV SOLUTION: Freq: Once | INTRAVENOUS | Status: AC

## 2024-04-17 MED ORDER — POTASSIUM CHLORIDE 10 MEQ/100ML IV SOLN
10.0000 meq | INTRAVENOUS | Status: AC
  Administered 2024-04-17 (×3): 10 meq via INTRAVENOUS
  Filled 2024-04-17 (×3): qty 100

## 2024-04-17 NOTE — Progress Notes (Signed)
 Initial Nutrition Assessment  DOCUMENTATION CODES:   Severe malnutrition in context of acute illness/injury  INTERVENTION:  Continue liberalized (therapeutically) diet and monitor tolerance  -Automatic meal trays  -Assistance/encouragement with feeding  - Add Magic cup TID with meals, each supplement provides 290 kcal and 9 grams of protein   Add Mighty Shake TID between meals, each supplement provides 330 kcals and 9 grams of protein   -Will come up as a snack between meals to augment intake  Monitor magnesium , potassium, and phosphorus daily for at least 3 days, MD to replete as needed  Add Thiamine 100 mg daily for 7 days   Add MVI w/ minerals   NUTRITION DIAGNOSIS:  Severe Malnutrition related to acute illness as evidenced by moderate fat depletion, moderate muscle depletion.  GOAL:  Patient will meet greater than or equal to 90% of their needs  MONITOR:  PO intake, Supplement acceptance, Diet advancement, Labs, Skin, Weight trends  REASON FOR ASSESSMENT:   Consult Assessment of nutrition requirement/status  ASSESSMENT:   Pt with PMH significant for: CVA w/ left sided hemiparesis, COPD, HTN, HLD, thoracoabdominal aortic aneurysm, TIA. Brought to ED after being found down and unresponsive for unknown amount of time. Found to be hypothermic, hypotensive, hpoglycemic and admitted for presumed undifferentiated shock, acute liver failure, AKI.  Recent Admissions: 08/31-09/02 respiratory failure 09/04-09/13 CHF exacerbation 9/13 returned to ED with c/o no power at home - discharged 11/14 ED for L hip pain - unrevealing w/u - discharged Current Admission:  12/06 admitted 12/07 RUQ US : hepatic steatosis 12/08 MBS: DYS2/NTL; txr out ICU; echo: EF 50-55% 12/09 downgrade to DYS1/NTL; ID consult c/f culture-negative endocarditis  Initially admitted to ICU. Improved with IV ABX and IV fluids. MBS completed 12/08 with DYS2/nectar thickened liquids ordered. Downgraded to  DYS1/nectar thickened liquids yesterday due to lethargy and mild-moderately weak throat clear with DYS2 diet texture. Hopeful mentation will improve w/ lactulose  administration as will appetite/intake. Receiving platelets due to persistent thrombocytopenia despite tx of potential underlying etiologies. Possible TEE as c/f culture-negative endocarditis when platelets improve.    Appears she was admitted to [facility] from 09/04-09/13 of this year for acute on chronic heart failure with preserved ejection fraction. Notable weight loss that admission, likely r/t diuresis. Therefore, cannot use weight trends as prognostic indicator for malnutrition, although some true body weight loss likely 2/2 psychosocial hx and presence of refeeding suggestive of inadequate intake.  Average Meal Intake 12/08: 155 x1 documented meal 12/09: 25-100% x3 documented meals 12/10: 25% x3 documented meals  Patient unable to give full nutrition-related history as her speech is garbled and she is delayed, at times in responding. Unable to report on recent level of intake. Is able say she has not been hungry. Endorses no N/V. Per RN, not eating much. Bowels moving with administration of lactulose  ongoing.   Appears she has been with multiple admissions in the last few months. Notably, returned to hospital on 11/13 after being discharged earlier that day with c/o no power at her home and requesting social work support. Reports of no power in the  home this admission as well. Notably, one recent admission stating SNF placement recommended and family amicable, however patient is not.   Admit Weight: 85.1 kg - appears pulled forward from previous encounter Current Weight: 74.5 kg (first measured)  Patient not able to endorse UBW. Per chart review, appears admission weight pulled forward from previous encounter back in September of this year. New weight much lower than  previous weight, indicating 12.5% weight loss in three  month time frame. This is considered clinically significant. As stated above, some of this trend likely attributable to diuresis and cannot, therefore, be used as a means to diagnose malnutrition, although some true body weight loss likely.   Appears to be refeeding on dextrose . Continue to monitor magnesium , phosphorus, and potassium. Replete as needed and add thiamine to mitigate refeeding. She is also at risk for other vitamin/mineral deficiencies. Will add MVI.   Meds: pantoprazole , IV ABX  Drips: D10W @ 10ml/hr 10 mEq KCl x3 K PHOS x1  BUN/Crt have stabilized with fluid resuscitation. Refeeding. Repletion ongoing. Monitor trends. At risk for additional vitamin/mineral deficiencies. Monitor for s/sx.   Labs:  Na+ 142 (wdl) K+ 4.9>2.8>2.9 (L) PHOS <1.0 Mg 2.1 (wdl) CBGs 104-131 x24 hours  NUTRITION - FOCUSED PHYSICAL EXAM: While she best meets criteria for malnutrition in the context of acute illness as evidenced by her admission dxs, it is likely that her underlying contributors are also chronic in nature putting her at risk for further nutrition-related decline.   Flowsheet Row Most Recent Value  Orbital Region Moderate depletion  Upper Arm Region Moderate depletion  Thoracic and Lumbar Region Moderate depletion  Buccal Region Mild depletion  Temple Region Mild depletion  Clavicle Bone Region Moderate depletion  Clavicle and Acromion Bone Region Moderate depletion  Scapular Bone Region Severe depletion  Dorsal Hand No depletion  Patellar Region No depletion  Anterior Thigh Region Moderate depletion  Posterior Calf Region Mild depletion  Edema (RD Assessment) None  Hair Reviewed  Eyes Reviewed  Mouth Reviewed  [poor dentition]  Skin Reviewed  Nails Reviewed    Diet Order:   Diet Order             DIET - DYS 1 Room service appropriate? No; Fluid consistency: Nectar Thick  Diet effective now                   EDUCATION NEEDS:   Not appropriate for  education at this time  Skin:  Skin Assessment: Skin Integrity Issues: Skin Integrity Issues:: Stage II, Unstageable Stage II: L hip, sacrum Unstageable: L buttocks  Last BM:  12/09 - type 7 x3  Height:  Ht Readings from Last 1 Encounters:  04/13/24 5' 10 (1.778 m)    Weight:  Wt Readings from Last 1 Encounters:  04/14/24 74.5 kg    Ideal Body Weight:  68.2 kg  BMI:  Body mass index is 23.57 kg/m.  Estimated Nutritional Needs:   Kcal:  1600-1800 kcals  Protein:  70-85g  Fluid:  1.6-1.8L/day  Blair Deaner MS, RD, LDN Registered Dietitian Clinical Nutrition RD Inpatient Contact Info in Amion

## 2024-04-17 NOTE — Progress Notes (Signed)
 Physical Therapy Treatment Patient Details Name: [name] MRN: [id number] DOB: 23-Sep-1945 Today's Date: 04/17/2024   History of Present Illness 78 y/o female presenting 04/14/24 after roommate called EMS as the patient was unresponsive. She reports chest pain and was very confused. Pt found under single blanket, alone with no heat or lights on in home. PMHx:  HTN, HLD, CVA left hemiparesis, CAPD, DVT, IVH. UA positive.    PT Comments  Session focused on progressing bed mobility and promoting upright tolerance. Pt states during session that she primarily stays in bed throughout the day. Pt also notes that daughter would help her squat pivot to bedside commode at baseline. Per chart review, pt is a poor historian and PLOF will need to be confirmed with daughter as able. Pt lethargic upon arrival and unable to answer orientation questions. Pt follows cues with additional time and in single steps. Pt maxA x1-2 for bed mobility and supine to sit transfer due to significant trunk and B UE and LE weakness. Pt attempts to mobilize legs EOB and use bed rail to assist with transfer but is unable to produce enough strength. Pt tolerates ~8 minutes sitting EOB but fatigues quickly. Pt would benefit from continued PT services focused on bed mobility, strengthening, and progressing transfers to improve functional mobility.     If plan is discharge home, recommend the following: A lot of help with walking and/or transfers;A lot of help with bathing/dressing/bathroom;Assistance with cooking/housework;Assistance with feeding;Assist for transportation;Supervision due to cognitive status;Help with stairs or ramp for entrance   Can travel by private vehicle     No  Equipment Recommendations  Wheelchair (measurements PT);Wheelchair cushion (measurements PT);Hospital bed;Hoyer lift    Recommendations for Other Services       Precautions / Restrictions Precautions Precautions: Fall Recall of  Precautions/Restrictions: Impaired Precaution/Restrictions Comments: Pt with poor safety awareness and cognitive deficits, unable to recall precautions. Restrictions Weight Bearing Restrictions Per Provider Order: No     Mobility  Bed Mobility Overal bed mobility: Needs Assistance (for sequencing and due to weakness) Bed Mobility: Supine to Sit, Sit to Supine     Supine to sit: Max assist, +2 for physical assistance Sit to supine: Max assist, +2 for physical assistance   General bed mobility comments: Pt with poor trunk control and significant L and R UE paresis, pt attempts to reach for rail but is otherwise unable to assist with transfer. Pt tolerates sitting EOB ~8 minutes and requests to lay back down due to fatigue.    Transfers                        Ambulation/Gait                   Stairs             Wheelchair Mobility     Tilt Bed    Modified Rankin (Stroke Patients Only)       Balance Overall balance assessment: Needs assistance Sitting-balance support: Feet supported, Single extremity supported (L UE flacid and does not contribute assist with trunk control wile seated EOB) Sitting balance-Leahy Scale: Poor Sitting balance - Comments: Pt initially requiring mod-max support for trunk control while EOB, pt with trunk lean to L and R when unsupported and is unable to correct. Cues provided throughout to hold position, pt attempts to use railing with R UE but often pushes to the L. Trunk control improves to occasional modA required and maintaining upright  position for longer bouts. Postural control: Right lateral lean, Left lateral lean                                  Communication Communication Communication: Impaired Factors Affecting Communication: Hearing impaired;Reduced clarity of speech  Cognition Arousal: Lethargic (became increasingly alert during mobility) Behavior During Therapy: Flat affect, WFL for tasks  assessed/performed   PT - Cognitive impairments: Memory, History of cognitive impairments, No family/caregiver present to determine baseline, Problem solving, Awareness, Safety/Judgement Difficult to assess due to: Hard of hearing/deaf Orientation impairments:  (Pt does not answer orientation questions correctly)                     Following commands: Impaired Following commands impaired: Follows one step commands inconsistently    Cueing Cueing Techniques: Verbal cues, Tactile cues, Visual cues  Exercises      General Comments General comments (skin integrity, edema, etc.): Edema noted dorsum of L hand. No significant skin abnormalities noted.      Pertinent Vitals/Pain Pain Assessment Pain Assessment: Faces Faces Pain Scale: Hurts a little bit Pain Location: L hand (chronic) Pain Descriptors / Indicators: Aching, Burning Pain Intervention(s): Limited activity within patient's tolerance, Monitored during session, Repositioned    Home Living                          Prior Function            PT Goals (current goals can now be found in the care plan section) Acute Rehab PT Goals PT Goal Formulation: With patient Progress towards PT goals: Progressing toward goals    Frequency    Min 2X/week      PT Plan      Co-evaluation              AM-PAC PT 6 Clicks Mobility   Outcome Measure  Help needed turning from your back to your side while in a flat bed without using bedrails?: A Lot Help needed moving from lying on your back to sitting on the side of a flat bed without using bedrails?: A Lot Help needed moving to and from a bed to a chair (including a wheelchair)?: Total Help needed standing up from a chair using your arms (e.g., wheelchair or bedside chair)?: Total Help needed to walk in hospital room?: Total Help needed climbing 3-5 steps with a railing? : Total 6 Click Score: 8    End of Session   Activity Tolerance: Patient  limited by fatigue;Patient limited by pain Patient left: in bed;with call bell/phone within reach;with bed alarm set Nurse Communication: Mobility status;Need for lift equipment PT Visit Diagnosis: Muscle weakness (generalized) (M62.81);Hemiplegia and hemiparesis Hemiplegia - Right/Left: Left Hemiplegia - dominant/non-dominant: Non-dominant Hemiplegia - caused by: Cerebral infarction     Time: 9076-9052 PT Time Calculation (min) (ACUTE ONLY): 24 min  Charges:    $Therapeutic Activity: 23-37 mins PT General Charges $$ ACUTE PT VISIT: 1 Visit                     Sabra Morel, PT, DPT  Acute Rehabilitation Services         Office: [phone]     Sabra MARLA Morel 04/17/2024, 11:13 AM

## 2024-04-17 NOTE — Progress Notes (Addendum)
 Kanisha Duba   DOB:December 26, 1945   FM#:[id number]      ASSESSMENT & PLAN:  Cheryn Lundquist is an 78 year old female patient admitted on 04/13/2024 with complaints of altered mental status.  Hematologic history is significant for thrombocytopenia and anemia.  Therefore hematology is following.  Thrombocytopenia - Platelets low 18K today.  Seen receiving 1 unit platelet pheresis, tolerating well. - May be secondary to liver dysfunction - Patient has history of thrombocytopenia and was seen in outpatient hematology office on 12/11/2023 for her platelets were noted to be mildly low 80K at that time. - Platelets smear WNL - Recommend platelet transfusion for platelet count <20 K or <50 K with acute bleeding. - Monitor closely for bleeding - Continue to monitor CBC with differential - Hematology/Dr. Lanny will make further recommendations.  Altered mental status - CT of head is negative - Likely secondary to liver failure and comorbidities - Appears to be improving.  Patient is minimally verbally responsive and answers questions appropriately.  Anemia History of GI bleed - Hemoglobin 9.0 - Patient admitted August 2025 with GI bleed.  Hemoglobin had dropped to 5 and she received PRBC transfusions. - Recommend PRBC transfusion for hemoglobin <7.0 - Continue to monitor CBC with differential  Acute liver failure -Bilirubin and LFTs improving significantly with total bilirubin WNL today - Avoid hepatotoxic agents - Monitor hepatic function  History of B12 deficiency - Received B12 injections August 2025.  Recommended at that time continuation of weekly B12 injections however patient no-showed for outpatient appointments.  History of CVA - Denies headaches, dizziness, chest pain. - Continue supportive care  Code Status Full  Subjective:  Patient seen awake and alert laying in bed.  States she is not good.  Patient is hard of hearing and chronically ill-appearing.  Admits to left hip pain.  Upon  examination patient is noted to have left hip ulcer and necrotic sacral ulcer.  Currently receiving 1 unit platelet pheresis.  Objective:   Intake/Output Summary (Last 24 hours) at 04/17/2024 1259 Last data filed at 04/17/2024 1132 Gross per 24 hour  Intake 1702.07 ml  Output 1100 ml  Net 602.07 ml     PHYSICAL EXAMINATION: ECOG PERFORMANCE STATUS: 4 - Bedbound  Vitals:   04/17/24 1145 04/17/24 1200  BP: 139/63 (!) 143/69  Pulse: (!) 58 (!) 56  Resp: 13 13  Temp: 97.8 F (36.6 C) 97.9 F (36.6 C)  SpO2: 98% 99%   Filed Weights   04/13/24 2023 04/14/24 0100  Weight: 187 lb 9.8 oz (85.1 kg) 164 lb 3.9 oz (74.5 kg)    GENERAL: alert, + mild distress + chronically ill-appearing  SKIN: + Left hip ulcer with dressing intact + sacral decubitus ulcer EYES: normal, conjunctiva are pink and non-injected, sclera clear OROPHARYNX: no exudate, no erythema and lips, buccal mucosa, and tongue normal  NECK: supple, thyroid  normal size, non-tender, without nodularity LYMPH: no palpable lymphadenopathy in the cervical, axillary or inguinal LUNGS: clear to auscultation and percussion with normal breathing effort HEART: regular rate & rhythm and no murmurs and no lower extremity edema ABDOMEN: abdomen soft, non-tender and normal bowel sounds MUSCULOSKELETAL: + Unable to ambulate  PSYCH: + Alert & somewhat oriented  NEURO: no focal motor/sensory deficits   All questions were answered. The patient knows to call the clinic with any problems, questions or concerns.   The total time spent in the appointment was 40 minutes encounter with patient including review of chart and various tests results, discussions about plan of care  and coordination of care plan  Olam JINNY Brunner, NP 04/17/2024 12:59 PM    Labs Reviewed:  Lab Results  Component Value Date   WBC 4.5 04/17/2024   WBC 4.5 04/17/2024   HGB 9.0 (L) 04/17/2024   HGB 9.0 (L) 04/17/2024   HCT 26.5 (L) 04/17/2024   HCT 26.4 (L)  04/17/2024   MCV 87.5 04/17/2024   MCV 88.3 04/17/2024   PLT 17 (LL) 04/17/2024   Recent Labs    04/14/24 0957 04/14/24 1958 04/15/24 0248 04/16/24 0345 04/17/24 0447  NA 146* 143 145 145 142  K 2.7* 3.5 4.9 2.8* 2.9*  CL 110 110 114* 112* 110  CO2 18* 20* 21* 17* 28  GLUCOSE 88 167* 147* 131* 104*  BUN 42* 40* 38* 23 15  CREATININE 1.59* 1.43* 1.27* 0.80 0.56  CALCIUM  7.4* 7.4* 7.4* 8.0* 7.9*  GFRNONAA 33* 37* 43* >60 >60  PROT 5.4* 5.3* 5.5* 6.0* 5.3*  ALBUMIN  1.9* 1.9* 1.8* 2.3* 1.9*  AST 2,619* 1,795* 1,475* 548* 219*  ALT 1,437* 1,236* 1,157* 734* 481*  ALKPHOS 82 82 90 93 104  BILITOT 2.2* 2.0* 2.0* 1.8* 1.2  BILIDIR 1.0* 1.0*  --   --   --   IBILI 1.2* 1.0*  --   --   --     Studies Reviewed:   US  ABD LTD RUG W/LIVER DOPPLER Result Date: 04/16/2024 EXAM: RIGHT UPPER QUADRANT ULTRASOUND WITH DOPPLER EVALUATION 04/15/2024 11:50:00 PM COMPARISON: Right upper quadrant ultrasound 04/14/2024. CT 04/13/2024. CLINICAL HISTORY: Elevated LFTs. FINDINGS: LIVER: Increased echotexture throughout the liver is compatible with fatty infiltration. No evidence of intrahepatic biliary ductal dilatation. OTHER: No evidence of right upper quadrant ascites. Vascular/Doppler: Doppler interrogation of the abdominal vasculature was performed. There is normal Doppler flow in the normal direction of the hepatic vasculature. Normal hepatopetal flow of the hepatic artery and portal vein. Normal hepatofugal flow of the hepatic veins. Normal Doppler waveforms are visualized. IMPRESSION: 1. Hepatic steatosis. 2. No evidence of hepatic vein or portal vein thrombosis. Electronically signed by: Franky Crease MD 04/16/2024 01:23 AM EST RP Workstation: [id number]   DG Swallowing Func-Speech Pathology Result Date: 04/15/2024 Table formatting from the original result was not included. Modified Barium Swallow Study Patient Details Name: Annalyce Lanpher MRN: [id number] Date of Birth: [birth year] Today's Date: 04/15/2024  HPI/PMH: HPI: Ms. Podolski is a 78 y/o female presenting 04/14/24 after roommate called EMS as the patient was unresponsive. She reports chest pain and was very confused. Pt found under single blanket, alone with no heat or lights on in home.   PMHx:  HTN, HLD, CVA left hemiparesis, CAPD, DVT, IVH. UA positive. SLP consulted for clinical swallow assessment. Clinical Impression: Pt presents with a mild-moderate oropharyngeal dysphagia per MBSS completed today. Recommend a mechanical altered/ground diet with nectar-thick liquids (straw ok), and meds crushed in applesauce. Oral deficits included reduced oral strength and coordination resulting in intermittent anterior loss of liquids by spoon or cup sip. Pt impulsively drank all liquids and was unable to try single sips despite cues. There was consistent posterior loss of thin and nectar-thick liquids to the level of the pyriform sinuses pre swallow with some pooling. There was piecemeal swallow of pudding and softened cracker. Min cracker residue on hard palate cleared with prompted nectar-thick liquid wash. Pharyngeal deficits included reduced base of tongue retraction, reduced laryngeal vestibule closure, reduced pharyngeal stripping, and reduced distention of UES. Findings -There was transient aspiration of thin liquid residue during cleansing swallows. Residue was ejected  to the level of the vocal cords (PAS-5) or the laryngeal vestibule (PAS-3) during swallow, which was inaudible. No stagnant aspiration in the airway observed. -There x1 instance of stagnant penetration of nectar-thick liquid wash post cracker. Penetrated residue did start to progress to level of the vocal cords (PAS3 - 5); though ejected with cued throat clear + swallow. All other penetration instances of nectar-thick liquids were transient. -There was min diffuse pharyngeal residue following liquids and pudding. A moderate amount of pharyngeal residue observed post cracker trial, which was cleared  with cued dry swallow. Plan: SLP will follow up to assess diet tolerance and modify diet as indicated. Pt may benefit from a repeat MBSS prior to liquid advancement given that inaudible responses to penetration/aspiration events. Factors that may increase risk of adverse event in presence of aspiration Noe & Lianne 2021): Factors that may increase risk of adverse event in presence of aspiration Noe & Lianne 2021): Reduced cognitive function; Limited mobility; Frail or deconditioned; Dependence for feeding and/or oral hygiene; Inadequate oral hygiene; Reduced saliva; Weak cough Recommendations/Plan: Swallowing Evaluation Recommendations Swallowing Evaluation Recommendations Recommendations: PO diet PO Diet Recommendation: Dysphagia 2 (Finely chopped); Mildly thick liquids (Level 2, nectar thick) Liquid Administration via: Cup; Straw Medication Administration: Crushed with puree Supervision: Staff to assist with self-feeding; Full supervision/cueing for swallowing strategies Swallowing strategies  : Minimize environmental distractions; Slow rate; Small bites/sips; Clear throat intermittently; Follow solids with liquids Postural changes: Position pt fully upright for meals Oral care recommendations: Oral care BID (2x/day) Treatment Plan Treatment Plan Treatment recommendations: Therapy as outlined in treatment plan below Follow-up recommendations: Skilled nursing-short term rehab (<3 hours/day) Functional status assessment: Patient has had a recent decline in their functional status and demonstrates the ability to make significant improvements in function in a reasonable and predictable amount of time. Treatment frequency: Min 1x/week Treatment duration: 2 weeks Interventions: Diet toleration management by SLP; Trials of upgraded texture/liquids; Patient/family education; Compensatory techniques Recommendations Recommendations for follow up therapy are one component of a multi-disciplinary discharge planning  process, led by the attending physician.  Recommendations may be updated based on patient status, additional functional criteria and insurance authorization. Assessment: Orofacial Exam: Orofacial Exam Oral Cavity: Oral Hygiene: Dried secretions Oral Cavity - Dentition: Poor condition; Missing dentition Orofacial Anatomy: WFL Oral Motor/Sensory Function: Unable to test Anatomy: Anatomy: WFL Boluses Administered: Boluses Administered Boluses Administered: Thin liquids (Level 0); Mildly thick liquids (Level 2, nectar thick); Puree; Solid  Oral Impairment Domain: Oral Impairment Domain Lip Closure: Escape from interlabial space or lateral juncture, no extension beyond vermillion border Tongue control during bolus hold: Posterior escape of greater than half of bolus Bolus preparation/mastication: Disorganized chewing/mashing with solid pieces of bolus unchewed Bolus transport/lingual motion: Slow tongue motion Oral residue: Residue collection on oral structures Location of oral residue : Tongue Initiation of pharyngeal swallow : Pyriform sinuses  Pharyngeal Impairment Domain: Pharyngeal Impairment Domain Soft palate elevation: No bolus between soft palate (SP)/pharyngeal wall (PW) Laryngeal elevation: Complete superior movement of thyroid  cartilage with complete approximation of arytenoids to epiglottic petiole Anterior hyoid excursion: Complete anterior movement Epiglottic movement: Complete inversion Laryngeal vestibule closure: Incomplete, narrow column air/contrast in laryngeal vestibule Pharyngeal stripping wave : Present - diminished Pharyngeal contraction (A/P view only): N/A Pharyngoesophageal segment opening: Partial distention/partial duration, partial obstruction of flow Tongue base retraction: Narrow column of contrast or air between tongue base and PPW Pharyngeal residue: Collection of residue within or on pharyngeal structures Location of pharyngeal residue: Tongue base; Valleculae; Pyriform sinuses  Esophageal Impairment Domain: Esophageal Impairment Domain Esophageal clearance upright position: Esophageal retention Pill: No data recorded Penetration/Aspiration Scale Score: Penetration/Aspiration Scale Score 1.  Material does not enter airway: Puree; Solid 2.  Material enters airway, remains ABOVE vocal cords then ejected out: Mildly thick liquids (Level 2, nectar thick) 3.  Material enters airway, remains ABOVE vocal cords and not ejected out: Mildly thick liquids (Level 2, nectar thick); Thin liquids (Level 0) 5.  Material enters airway, CONTACTS cords and not ejected out: Thin liquids (Level 0) 6.  Material enters airway, passes BELOW cords then ejected out: Thin liquids (Level 0) Compensatory Strategies: Compensatory Strategies Compensatory strategies: Yes Multiple swallows: Effective (when pt able to follow cue) Effective Multiple Swallows: Solid Chin tuck: -- (unable to attempt) Other(comment): Effective (throat clear+ swallow to eject penetration) Effective Other(comment): Mildly thick liquid (Level 2, nectar thick)   General Information: Caregiver present: No  Diet Prior to this Study: NPO   Temperature : Normal   Respiratory Status: WFL   Supplemental O2: None (Room air)   History of Recent Intubation: No  Behavior/Cognition: Alert; Cooperative [facility]) Self-Feeding Abilities: Needs set-up for self-feeding Baseline vocal quality/speech: Hypophonia/low volume Volitional Cough: Unable to elicit (occasional throat clear elicited) Volitional Swallow: Able to elicit (delayed) No data recorded Goal Planning: Prognosis for improved oropharyngeal function: Fair No data recorded No data recorded Patient/Family Stated Goal: none stated Consulted and agree with results and recommendations: Patient; Nurse Pain: Pain Assessment Pain Assessment: No/denies pain Pain Score: 0 Faces Pain Scale: 2 Facial Expression: 0 Body Movements: 0 Muscle Tension: 0 Compliance with ventilator (intubated pts.): N/A Vocalization  (extubated pts.): 0 CPOT Total: 0 Pain Location: grimacing with mobility tasks Pain Descriptors / Indicators: Grimacing End of Session: Start Time:SLP Start Time (ACUTE ONLY): 1438 Stop Time: SLP Stop Time (ACUTE ONLY): 1500 Time Calculation:SLP Time Calculation (min) (ACUTE ONLY): 22 min Charges: SLP Evaluations $ SLP Speech Visit: 1 Visit SLP Evaluations $MBS Swallow: 1 Procedure SLP visit diagnosis: SLP Visit Diagnosis: Dysphagia, oropharyngeal phase (R13.12) Past Medical History: Past Medical History: Diagnosis Date  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 (HCC) 09/16/2020  COPD (chronic obstructive pulmonary disease) (HCC)   History of DVT (deep vein thrombosis)   Hypertension   Thoracoabdominal aortic aneurysm   TIA (transient ischemic attack) 09/10/2020 Past Surgical History: Past Surgical History: Procedure Laterality Date  IR BONE MARROW BIOPSY & ASPIRATION  12/13/2023  IR CT HEAD LTD  09/11/2020  IR INTRAVSC STENT CERV CAROTID W/O EMB-PROT MOD SED INC ANGIO  09/11/2020  IR PERCUTANEOUS ART THROMBECTOMY/INFUSION INTRACRANIAL INC DIAG ANGIO  09/11/2020     IR PERCUTANEOUS ART THROMBECTOMY/INFUSION INTRACRANIAL INC DIAG ANGIO  09/11/2020  IR US  GUIDE VASC ACCESS RIGHT  09/11/2020  NO PAST SURGERIES    RADIOLOGY WITH ANESTHESIA N/A 09/11/2020  Procedure: IR WITH ANESTHESIA;  Surgeon: Dolphus Carrion, MD;  Location: [facility] OR;  Service: Radiology;  Laterality: N/A; [name] 04/15/2024, 3:33 PM  ECHOCARDIOGRAM COMPLETE Result Date: 04/15/2024    ECHOCARDIOGRAM REPORT   Patient Name:   MAHUM BETTEN Date of Exam: 04/15/2024 Medical Rec #:  [id number]   Height:       70.0 in Accession #:    [id number]  Weight:       164.2 lb Date of Birth:  Jun 02, 1945  BSA:          1.920 m Patient Age:    78 years    BP:  134/57 mmHg Patient Gender: F           HR:           56 bpm. Exam Location:  Inpatient Procedure: 2D Echo, Cardiac Doppler and Color Doppler (Both Spectral and  Color            Flow Doppler were utilized during procedure). Indications:    Shock  History:        Patient has prior history of Echocardiogram examinations, most                 recent 01/12/2024. Stroke, Signs/Symptoms:Altered Mental Status;                 Risk Factors:Dyslipidemia and Hypertension. ICH.  Sonographer:    Ellouise Mose RDCS Referring Phys: [id number] RAKESH V ALVA IMPRESSIONS  1. Left ventricular ejection fraction, by estimation, is 50 to 55%. Left ventricular ejection fraction by 2D MOD biplane is 51.5 %. Left ventricular ejection fraction by PLAX is 43 %. The left ventricle has low normal function. The left ventricle demonstrates global hypokinesis. There is moderate left ventricular hypertrophy. Left ventricular diastolic parameters are consistent with Grade I diastolic dysfunction (impaired relaxation).  2. Right ventricular systolic function is normal. The right ventricular size is normal. Tricuspid regurgitation signal is inadequate for assessing PA pressure.  3. The mitral valve is degenerative. Trivial mitral valve regurgitation. No evidence of mitral stenosis.  4. Calcified mass on the non-coronary cusp with some mobile material, measures 0.5 x 1.0 cm, could be old vegetation or thrombus. The aortic valve is tricuspid. There is severe thickening of the aortic valve. Aortic valve regurgitation is not visualized. Aortic valve sclerosis/calcification is present, without any evidence of aortic stenosis.  5. Aortic dilatation noted. There is mild dilatation of the ascending aorta, measuring 40 mm.  6. The inferior vena cava is dilated in size with <50% respiratory variability, suggesting right atrial pressure of 15 mmHg. Comparison(s): Changes from prior study are noted. 01/12/2024: LVEF 60-65%. FINDINGS  Left Ventricle: Left ventricular ejection fraction, by estimation, is 50 to 55%. Left ventricular ejection fraction by PLAX is 43 %. Left ventricular ejection fraction by 2D MOD biplane is 51.5 %. The  left ventricle has low normal function. The left ventricle demonstrates global hypokinesis. 3D ejection fraction reviewed and evaluated as part of the interpretation. Alternate measurement of EF is felt to be most reflective of LV function. The left ventricular internal cavity size was normal in size. There is moderate left ventricular hypertrophy. Left ventricular diastolic parameters are consistent with Grade I diastolic dysfunction (impaired relaxation). Indeterminate filling pressures. Right Ventricle: The right ventricular size is normal. No increase in right ventricular wall thickness. Right ventricular systolic function is normal. Tricuspid regurgitation signal is inadequate for assessing PA pressure. Left Atrium: Left atrial size was normal in size. Right Atrium: Right atrial size was normal in size. Pericardium: There is no evidence of pericardial effusion. Mitral Valve: The mitral valve is degenerative in appearance. Mild to moderate mitral annular calcification. Trivial mitral valve regurgitation. No evidence of mitral valve stenosis. Tricuspid Valve: The tricuspid valve is normal in structure. Tricuspid valve regurgitation is not demonstrated. No evidence of tricuspid stenosis. Aortic Valve: Calcified mass on the non-coronary cusp with some mobile material, measures 0.5 x 1.0 cm, could be old vegetation or thrombus. The aortic valve is tricuspid. There is severe thickening of the aortic valve. Aortic valve regurgitation is not visualized. Aortic valve sclerosis/calcification is present, without any evidence  of aortic stenosis. Pulmonic Valve: The pulmonic valve was normal in structure. Pulmonic valve regurgitation is not visualized. No evidence of pulmonic stenosis. Aorta: Aortic dilatation noted. There is mild dilatation of the ascending aorta, measuring 40 mm. Venous: The inferior vena cava is dilated in size with less than 50% respiratory variability, suggesting right atrial pressure of 15 mmHg.  IAS/Shunts: No atrial level shunt detected by color flow Doppler. Additional Comments: 3D was performed not requiring image post processing on an independent workstation and was abnormal.  LEFT VENTRICLE PLAX 2D                        Biplane EF (MOD) LV EF:         Left            LV Biplane EF:   Left                ventricular                      ventricular                ejection                         ejection                fraction by                      fraction by                PLAX is 43                       2D MOD                %.                               biplane is LVIDd:         4.70 cm                          51.5 %. LVIDs:         3.70 cm LV PW:         1.50 cm         Diastology LV IVS:        1.50 cm         LV e' medial:    3.81 cm/s LVOT diam:     2.30 cm         LV E/e' medial:  11.5 LV SV:         87              LV e' lateral:   3.70 cm/s LV SV Index:   45              LV E/e' lateral: 11.9 LVOT Area:     4.15 cm  LV Volumes (MOD) LV vol d, MOD    96.7 ml       3D Volume EF: A2C:                           3D EF:        46 % LV vol d, MOD  81.7 ml       LV EDV:       174 ml A4C:                           LV ESV:       95 ml LV vol s, MOD    44.3 ml       LV SV:        79 ml A2C: LV vol s, MOD    42.3 ml A4C: LV SV MOD A2C:   52.4 ml LV SV MOD A4C:   81.7 ml LV SV MOD BP:    46.9 ml RIGHT VENTRICLE             IVC RV S prime:     17.70 cm/s  IVC diam: 2.10 cm TAPSE (M-mode): 1.9 cm                             PULMONARY VEINS                             Diastolic Velocity: 27.90 cm/s                             S/D Velocity:       1.60                             Systolic Velocity:  44.10 cm/s LEFT ATRIUM             Index        RIGHT ATRIUM           Index LA diam:        3.90 cm 2.03 cm/m   RA Area:     11.40 cm LA Vol (A2C):   39.3 ml 20.47 ml/m  RA Volume:   24.10 ml  12.55 ml/m LA Vol (A4C):   42.8 ml 22.30 ml/m LA Biplane Vol: 41.7 ml 21.72 ml/m  AORTIC VALVE LVOT  Vmax:   100.00 cm/s LVOT Vmean:  58.600 cm/s LVOT VTI:    0.210 m  AORTA Ao Root diam: 3.30 cm Ao Asc diam:  3.95 cm MITRAL VALVE MV Area (PHT): 2.32 cm    SHUNTS MV Decel Time: 327 msec    Systemic VTI:  0.21 m MV E velocity: 44.00 cm/s  Systemic Diam: 2.30 cm MV A velocity: 89.70 cm/s MV E/A ratio:  0.49 Vinie Maxcy MD Electronically signed by Vinie Maxcy MD Signature Date/Time: 04/15/2024/3:29:26 PM    Final    DG Abd Portable 1V Result Date: 04/14/2024 EXAM: 1 VIEW XRAY OF THE ABDOMEN 04/14/2024 06:02:00 PM COMPARISON: Same day knees. CLINICAL HISTORY: Confusion. FINDINGS: LINES, TUBES AND DEVICES: Nasogastric tube tip has been advanced into body of stomach and is in grossly good position. BOWEL: Nonobstructive bowel gas pattern. SOFT TISSUES: No abnormal calcifications. BONES: No acute fracture. IMPRESSION: 1. Nasogastric tube tip projects over the body of the stomach in expected position. Electronically signed by: Lynwood Seip MD 04/14/2024 06:13 PM EST RP Workstation: [id number]   DG Abd 1 View Result Date: 04/14/2024 EXAM: 1 VIEW XRAY OF THE ABDOMEN 04/14/2024 12:00:00 PM COMPARISON: None available. CLINICAL HISTORY: Nasogastric tube present. FINDINGS: LINES, TUBES AND DEVICES: Enteric tube in place with tip projecting  over the stomach. Side port projecting over the distal esophagus. BOWEL: Nonobstructive bowel gas pattern. SOFT TISSUES: Atherosclerotic calcifications and abdominal aortic aneurysm. BONES: No acute fracture. IMPRESSION: 1. Enteric tube in place with tip projecting over the stomach and side port projecting over the distal esophagus. Recommend advancing the tube so the side port is beyond the gastroesophageal junction. 2. Abdominal aortic aneurysm with atherosclerotic calcifications. This is described in greater detail on CT of the abdomen and pelvis without contrast 04/13/2024 . Electronically signed by: Lonni Necessary MD 04/14/2024 04:27 PM EST RP Workstation: [id number]   US   RENAL Result Date: 04/14/2024 EXAM: US  Retroperitoneum Complete, Renal. 04/14/2024 12:20:00 PM TECHNIQUE: Real-time ultrasonography of the retroperitoneum renal was performed. COMPARISON: None available CLINICAL HISTORY: Acute kidney failure. FINDINGS: FINDINGS: RIGHT KIDNEY/URETER: Right kidney measures 10.5 x 5.5 x 5.0 cm. Normal cortical echogenicity. No hydronephrosis. No calculus. No mass. LEFT KIDNEY/URETER: Left kidney measures 10.5 x 5.3 x 4.9 cm. Normal cortical echogenicity. No hydronephrosis. No calculus. No mass. BLADDER: Urinary bladder is decompressed secondary to foley catheter. IMPRESSION: 1. No acute findings. Electronically signed by: Lynwood Seip MD 04/14/2024 12:50 PM EST RP Workstation: [id number]   US  Abdomen Limited RUQ (LIVER/GB) Result Date: 04/14/2024 EXAM: Right Upper Quadrant Abdominal Ultrasound TECHNIQUE: Real-time ultrasonography of the right upper quadrant of the abdomen was performed. COMPARISON: None available. CLINICAL HISTORY: LFT elevation. FINDINGS: LIVER: Increased hepatic steatosis. No intrahepatic biliary ductal dilatation. No mass. BILIARY SYSTEM: No evidence of pericholecystic fluid or wall thickening. No cholelithiasis. Common bile duct is within normal limits measuring 2.3 mm. RIGHT KIDNEY: The right kidney is grossly unremarkable in appearances without evidence of hydronephrosis, echogenic calculi or worrisome mass lesions. OTHER: No right upper quadrant ascites. IMPRESSION: 1. Hepatic steatosis. Electronically signed by: Morgane Naveau MD 04/14/2024 12:40 AM EST RP Workstation: [id number]   DG Chest Port 1 View Result Date: 04/13/2024 EXAM: 1 VIEW(S) XRAY OF THE CHEST 04/13/2024 08:39:00 PM COMPARISON: 01/11/2024. CLINICAL HISTORY: Questionable sepsis - evaluate for abnormality FINDINGS: LUNGS AND PLEURA: Left pleural calcifications noted. No focal pulmonary opacity. No pleural effusion. No pneumothorax. HEART AND MEDIASTINUM: Aortic atherosclerosis. BONES AND  SOFT TISSUES: No acute osseous abnormality. IMPRESSION: 1. No acute cardiopulmonary abnormality. 2. Left pleural calcifications. 3. Aortic atherosclerosis. Electronically signed by: Franky Crease MD 04/13/2024 09:40 PM EST RP Workstation: [id number]   CT CHEST ABDOMEN PELVIS WO CONTRAST Result Date: 04/13/2024 EXAM: CT CHEST, ABDOMEN AND PELVIS WITHOUT CONTRAST 04/13/2024 08:59:36 PM TECHNIQUE: CT of the chest, abdomen and pelvis was performed without the administration of intravenous contrast. Multiplanar reformatted images are provided for review. Automated exposure control, iterative reconstruction, and/or weight based adjustment of the mA/kV was utilized to reduce the radiation dose to as low as reasonably achievable. COMPARISON: 01/12/2024 CLINICAL HISTORY: abd pain FINDINGS: CHEST: MEDIASTINUM AND LYMPH NODES: Heart and pericardium are unremarkable. Diffuse coronary artery and aortic atherosclerosis. The central airways are clear. No mediastinal, hilar or axillary lymphadenopathy. LUNGS AND PLEURA: Like scarring in the left lower lobe posteriorly. Calcified pleural plaques in the left hemithorax, stable. No focal consolidation or pulmonary edema. No pleural effusion or pneumothorax. ABDOMEN AND PELVIS: LIVER: The liver is unremarkable. GALLBLADDER AND BILE DUCTS: Gallbladder is unremarkable. No biliary ductal dilatation. SPLEEN: No acute abnormality. PANCREAS: No acute abnormality. ADRENAL GLANDS: No acute abnormality. KIDNEYS, URETERS AND BLADDER: Foley catheter in the bladder, which is decompressed. No stones in the kidneys or ureters. No hydronephrosis. No perinephric or periureteral stranding. GI AND BOWEL: Stomach demonstrates no acute abnormality. Normal  appendix. There is no bowel obstruction. REPRODUCTIVE ORGANS: No acute abnormality. PERITONEUM AND RETROPERITONEUM: No ascites. No free air. VASCULATURE: Infrarenal abdominal aortic aneurysm measures up to 4.2 cm. 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 Diffuse aortoiliac atherosclerosis. ABDOMINAL AND PELVIS LYMPH NODES: No lymphadenopathy. BONES AND SOFT TISSUES: No acute osseous abnormality. No focal soft tissue abnormality. IMPRESSION: 1. 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 Diffuse aortoiliac atherosclerosis; recommend CT or MRI in 12 months and establish or continue care with a vascular specialist. 2. Coronary artery disease. 3. Calcified pleural plaques on the left. Scarring at the left lung base. Electronically signed by: Franky Crease MD 04/13/2024 09:06 PM EST RP Workstation: [id number]   CT Head Wo Contrast Result Date: 04/13/2024 EXAM: CT HEAD WITHOUT CONTRAST 04/13/2024 08:58:58 PM TECHNIQUE: CT of the head was performed without the administration of intravenous contrast. Automated exposure control, iterative reconstruction, and/or weight based adjustment of the mA/kV was utilized to reduce the radiation dose to as low as reasonably achievable. COMPARISON: 12/10/2023 CLINICAL HISTORY: delirium FINDINGS: BRAIN AND VENTRICLES: No acute hemorrhage. No evidence of acute infarct. Age-related cerebral and cerebellar volume loss. Remote encephalomalacia in right parietal lobe. Mild periventricular white matter disease. Ex vacuo dilatation of right lateral ventricle. No extra-axial collection. No mass effect or midline shift. Moderate calcific atheromatous disease within carotid siphons and vertebral arteries. ORBITS: No acute abnormality. SINUSES: Moderate bilateral ethmoid sinus disease. Bilateral maxillary sinus disease with air-fluid levels. Mild bilateral sphenoid and frontal sinus disease. SOFT TISSUES AND SKULL: No acute soft tissue abnormality. No skull fracture. IMPRESSION: 1. No acute intracranial abnormality. 2. Stable right parietal cortical encephalomalacia. 3. Stable age-related changes. 4. Bilateral maxillary sinus disease with  air-fluid levels, with additional ethmoid, sphenoid, and frontal sinus involvement, suggestive of acute sinusitis. Electronically signed by: Dorethia Molt MD 04/13/2024 09:05 PM EST RP Workstation: [id number]   DG Hip Unilat W or Wo Pelvis 2-3 Views Left Result Date: 03/22/2024 EXAM: 2 or 3 VIEW(S) XRAY OF THE LEFT HIP 03/22/2024 10:26:31 PM COMPARISON: 12/10/2023. CLINICAL HISTORY: hip pain, no trauma hip pain, no trauma FINDINGS: BONES AND JOINTS: No acute fracture or focal osseous lesion. The hip joint is maintained. No significant degenerative changes. SOFT TISSUES: The soft tissues are unremarkable. IMPRESSION: 1. No significant abnormality in the left hip or visualized pelvis. Electronically signed by: Dorethia Molt MD 03/22/2024 10:30 PM EST RP Workstation: [id number]   Addendum I have seen the patient, examined her. I agree with the assessment and and plan and have edited the notes.   I met pt during her last admission in August 2025, when she had severe anemia and thrombocytopenia.  She had a bone marrow biopsy on December 13, 2023, which was essentially normal.  The genetics was also normal.  Her anemia and thrombocytopenia significantly improved subsequently. She was admitted for shock, from dehydration and possible infection.  Echocardiogram showed possible endocarditis, but her blood culture has been negative so far.  Also has significant transaminitis, presumably related to the septic shock.  I think her thrombocytopenia is related to the acute illness and infection, her immature platelet count is not elevated, which does not support ITP, although ITP is not ruled out.  It is a diagnosis of exclusion other causes of thrombocytopenia.  Her mild AKI has resolved after hydration, peripheral blood smear was negative for schistocytes or other significant findings, TTP is ruled out. her anemia  is overall mild at this time, we will continue monitoring.  I agree with platelet transfusion, hold on steroids  for now especially given her possible endocarditis.  If her thrombocytopenia does not improve in 2 to 3 days, I would consider IVIG or Nplate. We will f/u.   Onita Mattock MD 04/17/2024

## 2024-04-17 NOTE — TOC Progression Note (Signed)
 Transition of Care [facility]) - Progression Note    Patient Details  Name: [name] MRN: [id number] Date of Birth: [birth year]  Transition of Care [facility] CM/SW Contact  Luann SHAUNNA [place], [place] Phone Number: 04/17/2024, 3:54 PM  Clinical Narrative:     CSW called pt's daughter Shona; left voicemail requesting return call.   Expected Discharge Plan: Skilled Nursing Facility Barriers to Discharge: Continued Medical Work up               Expected Discharge Plan and Services                                               Social Drivers of Health (SDOH) Interventions SDOH Screenings   Food Insecurity: Food Insecurity Present (04/15/2024)  Housing: High Risk (04/15/2024)  Transportation Needs: Unmet Transportation Needs (04/15/2024)  Utilities: At Risk (04/15/2024)  Depression (PHQ2-9): Low Risk  (06/04/2021)  Social Connections: Unknown (04/15/2024)  Tobacco Use: Medium Risk (03/22/2024)    Readmission Risk Interventions    01/15/2024    2:24 PM  Readmission Risk Prevention Plan  Transportation Screening Complete  HRI or Home Care Consult Complete  Social Work Consult for Recovery Care Planning/Counseling Complete  Palliative Care Screening Not Applicable  Medication Review Oceanographer) Complete

## 2024-04-17 NOTE — Plan of Care (Signed)

## 2024-04-17 NOTE — Progress Notes (Addendum)
 PROGRESS NOTE        PATIENT DETAILS Name: [name] Age: 78 y.o. Sex: female Date of Birth: 12-03-1945 Admit Date: 04/13/2024 Admitting Physician Tamela Stakes, MD ERE:Bzmryzrx, Rojelio PARAS, NP  Brief Summary: Patient is a 78 y.o.  female with history of CVA, COPD, HTN-who was brought to the ED after she was found by her roommate unresponsive (no heat/light-under a single blanket)-she was apparently covered in feces/dried blood-found to be hypothermic/hypotensive/hypoglycemic-she was thought to have undifferentiated shock-acute liver failure-acute kidney injury-admitted to the ICU-stabilized with pressors/D10 infusion/broad-spectrum antibiotics-rapidly improved and subsequently transferred to [facility].  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 12/8>> ANA: Positive 12/8>> anti-smooth muscle antibody: Negative  Significant microbiology data: 12/6>> COVID/influenza/RSV PCR: Negative 12/6>> blood culture: No growth 12/6>> acute hepatitis serology: Negative 12/6>> CMV DNA PCR: Negative  Procedures: None  Consults: PCCM GI ID Hematology  Subjective: Unchanged-no major issues overnight.  Awake-able to tell me her name-date of birth-age-answers simple questions appropriately-follows almost all my commands.  Objective: Vitals: Blood pressure 115/70, pulse 63,  temperature 97.6 F (36.4 C), temperature source Oral, resp. rate 14, height 5' 10 (1.778 m), weight 74.5 kg, SpO2 98%.   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    Pertinent Labs/Radiology:    Latest Ref Rng & Units 04/17/2024   10:17 AM 04/17/2024    4:47 AM 04/16/2024    3:45 AM  CBC  WBC 4.0 - 10.5 K/uL 4.0 - 10.5 K/uL  4.5    4.5  4.0   Hemoglobin 12.0 - 15.0 g/dL 87.9 - 84.9 g/dL  9.0    9.0  9.4   Hematocrit 36.0 - 46.0 % 36.0 - 46.0 %  26.5    26.4  27.1   Platelets 150 - 400 K/uL 17  17    18   34     Lab Results  Component Value Date   NA 142 04/17/2024   K 2.9 (L) 04/17/2024   CL 110 04/17/2024   CO2 28 04/17/2024      Assessment/Plan: Undifferentiated shock Unclear etiology-initially thought to have septic shock but all cultures negative to date-has rapidly improved-initially on admission to the ICU was started on vancomycin /Zosyn  but all antibiotics were discontinued on 12/7.  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  Acute metabolic encephalopathy Secondary to hypothermia/hypoglycemia/undifferentiated shock/acute liver failure Head CT without acute abnormalities Seems to have clinically improved after treatment of underlying etiologies.  Hypothermia Likely secondary to environmental exposure (per prior notes- cool room with single blanket-no heating or light) TSH stable Now normothermic after supportive care  Hypoglycemia Likely secondary  to acute liver injury Decrease D10 to 10 cc an hour-encourage oral intake-attempt to titrate off D10  infusion  Recent Labs    04/17/24 0007 04/17/24 0409 04/17/24 0734  GLUCAP 104* 102* 85     Acute liver failure Unclear etiology-possibly shock liver from hypotension from possible sepsis physiology LFTs/coags improving Dopplers without portal/hepatic vein thrombosis Acute hepatitis serology negative CMV IgM positive but viral load negative  Awaiting anti-smooth muscle antibody GI following-completed N-acetylcysteine  12/10 Empirically on lactulose   Thrombocytopenia Likely secondary to acute liver injury/failure-possible sepsis physiology However continues to worsen in spite of improvement of liver injury/sepsis physiology Thankfully no bleeding episodes Repeat DIC panel today with fibrinogen  more than 200 (improved from prior) Labs reviewed with primary oncologist/hematologist-Dr. Feng-Will evaluate but recommends 1 unit of pheresis platelets. Follow CBC and await recommendations from hematology service.  Normocytic anemia Likely secondary to critical illness Continue to trend/follow CBC  Oropharyngeal dysphagia Secondary to critical illness/encephalopathy SLP following-on dysphagia 2 diet.  AKI Hemodynamically mediated due to hypotension Resolved.  Hypokalemia/Hypophosphatemia Replete/recheck  Metabolic acidosis Likely due to delayed clearance of lactic acid-in the setting of liver failure. Improved with supportive care  Self-limited episode-small-volume coffee-ground emesis Hb stable PPI GI following-no endoscopic procedures planned at this point  Chest pain Likely atypical Troponins minimally elevated-echo with stable EF Supportive care for now  Minimally elevated troponins Trend is flat Likely demand ischemia in the setting of hypotension/critical illness. Echo with able EF  HTN BP stable without use of any antihypertensives.  History of chronic HFpEF Status stable  History of CVA Baseline left hemiparesis Antiplatelets/statin held-in the  setting of elevated liver enzymes and thrombocytopenia.  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 (Active)     Wound 04/14/24 0100 Pressure Injury Buttocks Left Unstageable - Full thickness tissue loss in which the base of the injury is covered by slough (yellow, tan, gray, green or brown) and/or eschar (tan, brown or black) in the wound bed. (Active)     Wound 04/14/24 0100 Pressure Injury Sacrum Medial Stage 2 -  Partial thickness loss of dermis presenting as a shallow open injury with a red, pink wound bed without slough. (Active)    Code status:   Code Status: Full Code   DVT Prophylaxis: SCDs Start: 04/13/24 2344   Family Communication: Daughter-[phone] left VM 12/10   Disposition Plan: Status is: Inpatient Remains inpatient appropriate because: Severity of illness   Planned Discharge Destination:Skilled nursing facility   Diet: Diet Order             DIET - DYS 1 Room service appropriate? No; Fluid consistency: Nectar Thick  Diet effective now                     Antimicrobial agents: Anti-infectives (From admission, onward)    Start     Dose/Rate Route Frequency Ordered Stop   04/16/24 1415  DAPTOmycin  (CUBICIN ) IVPB 700 mg/141mL premix        700 mg 200 mL/hr over 30 Minutes Intravenous Daily 04/16/24 1324     04/16/24 1415  cefTRIAXone  (ROCEPHIN ) 2 g in sodium chloride  0.9 % 100 mL IVPB        2 g 200 mL/hr over 30 Minutes Intravenous Daily 04/16/24 1324     04/14/24 0600   piperacillin -tazobactam (ZOSYN ) IVPB 3.375 g  Status:  Discontinued        3.375 g 12.5 mL/hr over 240 Minutes Intravenous Every 8 hours 04/14/24 0132 04/15/24 0920   04/13/24 2045  ceFEPIme  (MAXIPIME ) 2 g in sodium chloride  0.9 % 100 mL IVPB        2 g 200 mL/hr over 30 Minutes Intravenous  Once 04/13/24 2030 04/13/24 2147   04/13/24 2045  metroNIDAZOLE  (FLAGYL ) IVPB 500 mg        500 mg 100 mL/hr over 60 Minutes Intravenous  Once 04/13/24 2030 04/13/24 2310   04/13/24 2045  vancomycin  (VANCOCIN ) IVPB 1000 mg/200 mL premix  Status:  Discontinued        1,000 mg 200 mL/hr over 60 Minutes Intravenous  Once 04/13/24 2030 04/13/24 2032   04/13/24 2045  vancomycin  (VANCOREADY) IVPB 1750 mg/350 mL        1,750 mg 175 mL/hr over 120 Minutes Intravenous  Once 04/13/24 2032 04/13/24 2349        MEDICATIONS: Scheduled Meds:  Chlorhexidine  Gluconate Cloth  6 each Topical Daily   feeding supplement  237 mL Oral BID BM   lactulose   20 g Oral TID   mouth rinse  15 mL Mouth Rinse 4 times per day   pantoprazole  (PROTONIX ) IV  40 mg Intravenous Q12H   Continuous Infusions:  cefTRIAXone  (ROCEPHIN )  IV 2 g (04/16/24 1345)   DAPTOmycin  700 mg (04/16/24 1454)   dextrose  20 mL/hr at 04/16/24 2132   potassium chloride  10 mEq (04/17/24 1050)   potassium PHOSPHATE IVPB (in mmol) 45 mmol (04/17/24 0744)   PRN Meds:.docusate, ipratropium-albuterol , ondansetron  (ZOFRAN ) IV, mouth rinse, mouth rinse, polyethylene glycol   I have personally reviewed following labs and imaging studies  LABORATORY DATA: CBC: Recent Labs  Lab 04/13/24 2123 04/13/24 2132 04/14/24 0957 04/15/24 0248 04/16/24 0345 04/17/24 0447 04/17/24 1017  WBC 6.8  --  5.1 5.7 4.0 4.5  4.5  --   NEUTROABS 6.2  --   --   --   --  2.3  --   HGB 11.9* 12.6  13.3 9.5* 9.6* 9.4* 9.0*  9.0*  --   HCT 36.4 37.0  39.0 28.8* 28.9* 27.1* 26.4*  26.5*  --   MCV 90.5  --  90.9 89.8 88.3 88.3  87.5  --   PLT 56*  --  52*  41* 43*   41* 34* 18*  17* 17*    Basic Metabolic Panel: Recent Labs  Lab 04/14/24 0957 04/14/24 1958 04/15/24 0248 04/16/24 0345 04/17/24 0447  NA 146* 143 145 145 142  K 2.7* 3.5 4.9 2.8* 2.9*  CL 110 110 114* 112* 110  CO2 18* 20* 21* 17* 28  GLUCOSE 88 167* 147* 131* 104*  BUN 42* 40* 38* 23 15  CREATININE 1.59* 1.43* 1.27* 0.80 0.56  CALCIUM  7.4* 7.4* 7.4* 8.0* 7.9*  MG 1.7  --  1.7 1.8 2.1  PHOS  --   --   --   --  <1.0*    GFR: Estimated Creatinine Clearance: 60.7 mL/min (by C-G formula based on SCr of 0.56 mg/dL).  Liver Function Tests: Recent Labs  Lab 04/14/24 0957 04/14/24 1958 04/15/24 0248 04/16/24 0345 04/17/24 0447  AST 2,619* 1,795* 1,475* 548*  219*  ALT 1,437* 1,236* 1,157* 734* 481*  ALKPHOS 82 82 90 93 104  BILITOT 2.2* 2.0* 2.0* 1.8* 1.2  PROT 5.4* 5.3* 5.5* 6.0* 5.3*  ALBUMIN  1.9* 1.9* 1.8* 2.3* 1.9*   Recent Labs  Lab 04/13/24 2123  LIPASE 106*   Recent Labs  Lab 04/14/24 0005 04/14/24 0957  AMMONIA 50* 60*    Coagulation Profile: Recent Labs  Lab 04/15/24 0248 04/15/24 1617 04/16/24 0345 04/17/24 0447 04/17/24 1017  INR 1.9* 1.4* 1.5* 1.3* 1.3*    Cardiac Enzymes: Recent Labs  Lab 04/15/24 0955 04/17/24 0447  CKTOTAL 232 91    BNP (last 3 results) Recent Labs    01/11/24 2300  PROBNP 3,648.0*    Lipid Profile: No results for input(s): CHOL, HDL, LDLCALC, TRIG, CHOLHDL, LDLDIRECT in the last 72 hours.  Thyroid  Function Tests: No results for input(s): TSH, T4TOTAL, FREET4, T3FREE, THYROIDAB in the last 72 hours.   Anemia Panel: No results for input(s): VITAMINB12, FOLATE, FERRITIN, TIBC, IRON, RETICCTPCT in the last 72 hours.  Urine analysis:    Component Value Date/Time   COLORURINE AMBER (A) 04/13/2024 2041   APPEARANCEUR CLOUDY (A) 04/13/2024 2041   LABSPEC 1.025 04/13/2024 2041   PHURINE 5.0 04/13/2024 2041   GLUCOSEU NEGATIVE 04/13/2024 2041   HGBUR SMALL (A) 04/13/2024  2041   BILIRUBINUR SMALL (A) 04/13/2024 2041   KETONESUR NEGATIVE 04/13/2024 2041   PROTEINUR 100 (A) 04/13/2024 2041   UROBILINOGEN 1.0 11/15/2010 2015   NITRITE NEGATIVE 04/13/2024 2041   LEUKOCYTESUR NEGATIVE 04/13/2024 2041    Sepsis Labs: Lactic Acid, Venous    Component Value Date/Time   LATICACIDVEN 4.5 (HH) 04/15/2024 1538    MICROBIOLOGY: Recent Results (from the past 240 hours)  MRSA Next Gen by PCR, Nasal     Status: None   Collection Time: 04/13/24 12:51 AM   Specimen: Nasal Mucosa; Nasal Swab  Result Value Ref Range Status   MRSA by PCR Next Gen NOT DETECTED NOT DETECTED Final    Comment: (NOTE) The GeneXpert MRSA Assay (FDA approved for NASAL specimens only), is one component of a comprehensive MRSA colonization surveillance program. It is not intended to diagnose MRSA infection nor to guide or monitor treatment for MRSA infections. Test performance is not FDA approved in patients less than 71 years old. Performed at [facility], [address] [place], [place] [address]   Resp panel by RT-PCR (RSV, Flu A&B, Covid) Anterior Nasal Swab     Status: None   Collection Time: 04/13/24  8:30 PM   Specimen: Anterior Nasal Swab  Result Value Ref Range Status   SARS Coronavirus 2 by RT PCR NEGATIVE NEGATIVE Final   Influenza A by PCR NEGATIVE NEGATIVE Final   Influenza B by PCR NEGATIVE NEGATIVE Final    Comment: (NOTE) The Xpert Xpress SARS-CoV-2/FLU/RSV plus assay is intended as an aid in the diagnosis of influenza from Nasopharyngeal swab specimens and should not be used as a sole basis for treatment. Nasal washings and aspirates are unacceptable for Xpert Xpress SARS-CoV-2/FLU/RSV testing.  Fact Sheet for Patients: bloggercourse.com  Fact Sheet for Healthcare Providers: seriousbroker.it  This test is not yet approved or cleared by the [place]  FDA and has been authorized for detection and/or  diagnosis of SARS-CoV-2 by FDA under an Emergency Use Authorization (EUA). This EUA will remain in effect (meaning this test can be used) for the duration of the COVID-19 declaration under Section 564(b)(1) of the Act, 21 U.S.C. section 360bbb-3(b)(1),  unless the authorization is terminated or revoked.     Resp Syncytial Virus by PCR NEGATIVE NEGATIVE Final    Comment: (NOTE) Fact Sheet for Patients: bloggercourse.com  Fact Sheet for Healthcare Providers: seriousbroker.it  This test is not yet approved or cleared by the [place]  FDA and has been authorized for detection and/or diagnosis of SARS-CoV-2 by FDA under an Emergency Use Authorization (EUA). This EUA will remain in effect (meaning this test can be used) for the duration of the COVID-19 declaration under Section 564(b)(1) of the Act, 21 U.S.C. section 360bbb-3(b)(1), unless the authorization is terminated or revoked.  Performed at [facility], [address], [place], [place] [address]   Blood Culture (routine x 2)     Status: None (Preliminary result)   Collection Time: 04/13/24  8:30 PM   Specimen: BLOOD  Result Value Ref Range Status   Specimen Description BLOOD SITE NOT SPECIFIED  Final   Special Requests   Final    BOTTLES DRAWN AEROBIC AND ANAEROBIC Blood Culture adequate volume   Culture   Final    NO GROWTH 4 DAYS Performed at [facility], [address], [place], [place] [address]    Report Status PENDING  Incomplete  Blood Culture (routine x 2)     Status: None (Preliminary result)   Collection Time: 04/13/24  8:35 PM   Specimen: BLOOD LEFT FOREARM  Result Value Ref Range Status   Specimen Description BLOOD LEFT FOREARM  Final   Special Requests   Final    BOTTLES DRAWN AEROBIC AND ANAEROBIC Blood Culture results may not be optimal due to an inadequate volume of blood received in culture bottles   Culture   Final    NO GROWTH 4  DAYS Performed at [facility], [address], [place], [place] [address]    Report Status PENDING  Incomplete  Culture, blood (Routine X 2) w Reflex to ID Panel     Status: None (Preliminary result)   Collection Time: 04/16/24 11:42 AM   Specimen: BLOOD  Result Value Ref Range Status   Specimen Description BLOOD SITE NOT SPECIFIED  Final   Special Requests   Final    BOTTLES DRAWN AEROBIC AND ANAEROBIC Blood Culture adequate volume   Culture   Final    NO GROWTH < 24 HOURS Performed at [facility], [address] [place], [place] [address]    Report Status PENDING  Incomplete  Culture, blood (Routine X 2) w Reflex to ID Panel     Status: None (Preliminary result)   Collection Time: 04/16/24  2:56 PM   Specimen: BLOOD RIGHT ARM  Result Value Ref Range Status   Specimen Description BLOOD RIGHT ARM  Final   Special Requests   Final    BOTTLES DRAWN AEROBIC AND ANAEROBIC Blood Culture adequate volume   Culture   Final    NO GROWTH < 24 HOURS Performed at [facility], [address], [place], [place] [address]    Report Status PENDING  Incomplete    RADIOLOGY STUDIES/RESULTS: US  ABD LTD RUG W/LIVER DOPPLER Result Date: 04/16/2024 EXAM: RIGHT UPPER QUADRANT ULTRASOUND WITH DOPPLER EVALUATION 04/15/2024 11:50:00 PM COMPARISON: Right upper quadrant ultrasound 04/14/2024. CT 04/13/2024. CLINICAL HISTORY: Elevated LFTs. FINDINGS: LIVER: Increased echotexture throughout the liver is compatible with fatty infiltration. No evidence of intrahepatic biliary ductal dilatation. OTHER: No evidence of right upper quadrant ascites. Vascular/Doppler: Doppler interrogation of the abdominal vasculature was performed.  There is normal Doppler flow in the normal direction of the hepatic vasculature. Normal hepatopetal flow of the hepatic artery and portal vein. Normal hepatofugal flow of the hepatic veins. Normal Doppler waveforms are visualized. IMPRESSION: 1. Hepatic steatosis. 2. No  evidence of hepatic vein or portal vein thrombosis. Electronically signed by: Franky Crease MD 04/16/2024 01:23 AM EST RP Workstation: [id number]   DG Swallowing Func-Speech Pathology Result Date: 04/15/2024 Table formatting from the original result was not included. Modified Barium Swallow Study Patient Details Name: Jonda Alanis MRN: [id number] Date of Birth: [birth year] Today's Date: 04/15/2024 HPI/PMH: HPI: Ms. Santini is a 78 y/o female presenting 04/14/24 after roommate called EMS as the patient was unresponsive. She reports chest pain and was very confused. Pt found under single blanket, alone with no heat or lights on in home.   PMHx:  HTN, HLD, CVA left hemiparesis, CAPD, DVT, IVH. UA positive. SLP consulted for clinical swallow assessment. Clinical Impression: Pt presents with a mild-moderate oropharyngeal dysphagia per MBSS completed today. Recommend a mechanical altered/ground diet with nectar-thick liquids (straw ok), and meds crushed in applesauce. Oral deficits included reduced oral strength and coordination resulting in intermittent anterior loss of liquids by spoon or cup sip. Pt impulsively drank all liquids and was unable to try single sips despite cues. There was consistent posterior loss of thin and nectar-thick liquids to the level of the pyriform sinuses pre swallow with some pooling. There was piecemeal swallow of pudding and softened cracker. Min cracker residue on hard palate cleared with prompted nectar-thick liquid wash. Pharyngeal deficits included reduced base of tongue retraction, reduced laryngeal vestibule closure, reduced pharyngeal stripping, and reduced distention of UES. Findings -There was transient aspiration of thin liquid residue during cleansing swallows. Residue was ejected to the level of the vocal cords (PAS-5) or the laryngeal vestibule (PAS-3) during swallow, which was inaudible. No stagnant aspiration in the airway observed. -There x1 instance of stagnant penetration of  nectar-thick liquid wash post cracker. Penetrated residue did start to progress to level of the vocal cords (PAS3 - 5); though ejected with cued throat clear + swallow. All other penetration instances of nectar-thick liquids were transient. -There was min diffuse pharyngeal residue following liquids and pudding. A moderate amount of pharyngeal residue observed post cracker trial, which was cleared with cued dry swallow. Plan: SLP will follow up to assess diet tolerance and modify diet as indicated. Pt may benefit from a repeat MBSS prior to liquid advancement given that inaudible responses to penetration/aspiration events. Factors that may increase risk of adverse event in presence of aspiration Noe & Lianne 2021): Factors that may increase risk of adverse event in presence of aspiration Noe & Lianne 2021): Reduced cognitive function; Limited mobility; Frail or deconditioned; Dependence for feeding and/or oral hygiene; Inadequate oral hygiene; Reduced saliva; Weak cough Recommendations/Plan: Swallowing Evaluation Recommendations Swallowing Evaluation Recommendations Recommendations: PO diet PO Diet Recommendation: Dysphagia 2 (Finely chopped); Mildly thick liquids (Level 2, nectar thick) Liquid Administration via: Cup; Straw Medication Administration: Crushed with puree Supervision: Staff to assist with self-feeding; Full supervision/cueing for swallowing strategies Swallowing strategies  : Minimize environmental distractions; Slow rate; Small bites/sips; Clear throat intermittently; Follow solids with liquids Postural changes: Position pt fully upright for meals Oral care recommendations: Oral care BID (2x/day) Treatment Plan Treatment Plan Treatment recommendations: Therapy as outlined in treatment plan below Follow-up recommendations: Skilled nursing-short term rehab (<3 hours/day) Functional status assessment: Patient has had a recent decline in their functional status and demonstrates the ability to  make significant improvements in function in a reasonable and predictable amount of time. Treatment frequency: Min 1x/week Treatment duration: 2 weeks Interventions: Diet toleration management by SLP; Trials of upgraded texture/liquids; Patient/family education; Compensatory techniques Recommendations Recommendations for follow up therapy are one component of a multi-disciplinary discharge planning process, led by the attending physician.  Recommendations may be updated based on patient status, additional functional criteria and insurance authorization. Assessment: Orofacial Exam: Orofacial Exam Oral Cavity: Oral Hygiene: Dried secretions Oral Cavity - Dentition: Poor condition; Missing dentition Orofacial Anatomy: WFL Oral Motor/Sensory Function: Unable to test Anatomy: Anatomy: WFL Boluses Administered: Boluses Administered Boluses Administered: Thin liquids (Level 0); Mildly thick liquids (Level 2, nectar thick); Puree; Solid  Oral Impairment Domain: Oral Impairment Domain Lip Closure: Escape from interlabial space or lateral juncture, no extension beyond vermillion border Tongue control during bolus hold: Posterior escape of greater than half of bolus Bolus preparation/mastication: Disorganized chewing/mashing with solid pieces of bolus unchewed Bolus transport/lingual motion: Slow tongue motion Oral residue: Residue collection on oral structures Location of oral residue : Tongue Initiation of pharyngeal swallow : Pyriform sinuses  Pharyngeal Impairment Domain: Pharyngeal Impairment Domain Soft palate elevation: No bolus between soft palate (SP)/pharyngeal wall (PW) Laryngeal elevation: Complete superior movement of thyroid  cartilage with complete approximation of arytenoids to epiglottic petiole Anterior hyoid excursion: Complete anterior movement Epiglottic movement: Complete inversion Laryngeal vestibule closure: Incomplete, narrow column air/contrast in laryngeal vestibule Pharyngeal stripping wave : Present  - diminished Pharyngeal contraction (A/P view only): N/A Pharyngoesophageal segment opening: Partial distention/partial duration, partial obstruction of flow Tongue base retraction: Narrow column of contrast or air between tongue base and PPW Pharyngeal residue: Collection of residue within or on pharyngeal structures Location of pharyngeal residue: Tongue base; Valleculae; Pyriform sinuses  Esophageal Impairment Domain: Esophageal Impairment Domain Esophageal clearance upright position: Esophageal retention Pill: No data recorded Penetration/Aspiration Scale Score: Penetration/Aspiration Scale Score 1.  Material does not enter airway: Puree; Solid 2.  Material enters airway, remains ABOVE vocal cords then ejected out: Mildly thick liquids (Level 2, nectar thick) 3.  Material enters airway, remains ABOVE vocal cords and not ejected out: Mildly thick liquids (Level 2, nectar thick); Thin liquids (Level 0) 5.  Material enters airway, CONTACTS cords and not ejected out: Thin liquids (Level 0) 6.  Material enters airway, passes BELOW cords then ejected out: Thin liquids (Level 0) Compensatory Strategies: Compensatory Strategies Compensatory strategies: Yes Multiple swallows: Effective (when pt able to follow cue) Effective Multiple Swallows: Solid Chin tuck: -- (unable to attempt) Other(comment): Effective (throat clear+ swallow to eject penetration) Effective Other(comment): Mildly thick liquid (Level 2, nectar thick)   General Information: Caregiver present: No  Diet Prior to this Study: NPO   Temperature : Normal   Respiratory Status: WFL   Supplemental O2: None (Room air)   History of Recent Intubation: No  Behavior/Cognition: Alert; Cooperative [facility]) Self-Feeding Abilities: Needs set-up for self-feeding Baseline vocal quality/speech: Hypophonia/low volume Volitional Cough: Unable to elicit (occasional throat clear elicited) Volitional Swallow: Able to elicit (delayed) No data recorded Goal Planning: Prognosis for  improved oropharyngeal function: Fair No data recorded No data recorded Patient/Family Stated Goal: none stated Consulted and agree with results and recommendations: Patient; Nurse Pain: Pain Assessment Pain Assessment: No/denies pain Pain Score: 0 Faces Pain Scale: 2 Facial Expression: 0 Body Movements: 0 Muscle Tension: 0 Compliance with ventilator (intubated pts.): N/A Vocalization (extubated pts.): 0 CPOT Total: 0 Pain Location: grimacing with mobility tasks Pain Descriptors / Indicators: Grimacing End of Session:  Start Time:SLP Start Time (ACUTE ONLY): 1438 Stop Time: SLP Stop Time (ACUTE ONLY): 1500 Time Calculation:SLP Time Calculation (min) (ACUTE ONLY): 22 min Charges: SLP Evaluations $ SLP Speech Visit: 1 Visit SLP Evaluations $MBS Swallow: 1 Procedure SLP visit diagnosis: SLP Visit Diagnosis: Dysphagia, oropharyngeal phase (R13.12) Past Medical History: Past Medical History: Diagnosis Date  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 (HCC) 09/16/2020  COPD (chronic obstructive pulmonary disease) (HCC)   History of DVT (deep vein thrombosis)   Hypertension   Thoracoabdominal aortic aneurysm   TIA (transient ischemic attack) 09/10/2020 Past Surgical History: Past Surgical History: Procedure Laterality Date  IR BONE MARROW BIOPSY & ASPIRATION  12/13/2023  IR CT HEAD LTD  09/11/2020  IR INTRAVSC STENT CERV CAROTID W/O EMB-PROT MOD SED INC ANGIO  09/11/2020  IR PERCUTANEOUS ART THROMBECTOMY/INFUSION INTRACRANIAL INC DIAG ANGIO  09/11/2020     IR PERCUTANEOUS ART THROMBECTOMY/INFUSION INTRACRANIAL INC DIAG ANGIO  09/11/2020  IR US  GUIDE VASC ACCESS RIGHT  09/11/2020  NO PAST SURGERIES    RADIOLOGY WITH ANESTHESIA N/A 09/11/2020  Procedure: IR WITH ANESTHESIA;  Surgeon: Dolphus Carrion, MD;  Location: [facility] OR;  Service: Radiology;  Laterality: N/A; [name] 04/15/2024, 3:33 PM  ECHOCARDIOGRAM COMPLETE Result Date: 04/15/2024    ECHOCARDIOGRAM REPORT   Patient Name:    BLONDINE HOTTEL Date of Exam: 04/15/2024 Medical Rec #:  [id number]   Height:       70.0 in Accession #:    [id number]  Weight:       164.2 lb Date of Birth:  May 22, 1945  BSA:          1.920 m Patient Age:    78 years    BP:           134/57 mmHg Patient Gender: F           HR:           56 bpm. Exam Location:  Inpatient Procedure: 2D Echo, Cardiac Doppler and Color Doppler (Both Spectral and Color            Flow Doppler were utilized during procedure). Indications:    Shock  History:        Patient has prior history of Echocardiogram examinations, most                 recent 01/12/2024. Stroke, Signs/Symptoms:Altered Mental Status;                 Risk Factors:Dyslipidemia and Hypertension. ICH.  Sonographer:    Ellouise Mose RDCS Referring Phys: [phone] RAKESH V ALVA IMPRESSIONS  1. Left ventricular ejection fraction, by estimation, is 50 to 55%. Left ventricular ejection fraction by 2D MOD biplane is 51.5 %. Left ventricular ejection fraction by PLAX is 43 %. The left ventricle has low normal function. The left ventricle demonstrates global hypokinesis. There is moderate left ventricular hypertrophy. Left ventricular diastolic parameters are consistent with Grade I diastolic dysfunction (impaired relaxation).  2. Right ventricular systolic function is normal. The right ventricular size is normal. Tricuspid regurgitation signal is inadequate for assessing PA pressure.  3. The mitral valve is degenerative. Trivial mitral valve regurgitation. No evidence of mitral stenosis.  4. Calcified mass on the non-coronary cusp with some mobile material, measures 0.5 x 1.0 cm, could be old vegetation or thrombus. The aortic valve is tricuspid. There is severe thickening of the aortic valve. Aortic valve regurgitation is not visualized. Aortic valve sclerosis/calcification is present, without any evidence  of aortic stenosis.  5. Aortic dilatation noted. There is mild dilatation of the ascending aorta, measuring 40 mm.  6. The inferior vena  cava is dilated in size with <50% respiratory variability, suggesting right atrial pressure of 15 mmHg. Comparison(s): Changes from prior study are noted. 01/12/2024: LVEF 60-65%. FINDINGS  Left Ventricle: Left ventricular ejection fraction, by estimation, is 50 to 55%. Left ventricular ejection fraction by PLAX is 43 %. Left ventricular ejection fraction by 2D MOD biplane is 51.5 %. The left ventricle has low normal function. The left ventricle demonstrates global hypokinesis. 3D ejection fraction reviewed and evaluated as part of the interpretation. Alternate measurement of EF is felt to be most reflective of LV function. The left ventricular internal cavity size was normal in size. There is moderate left ventricular hypertrophy. Left ventricular diastolic parameters are consistent with Grade I diastolic dysfunction (impaired relaxation). Indeterminate filling pressures. Right Ventricle: The right ventricular size is normal. No increase in right ventricular wall thickness. Right ventricular systolic function is normal. Tricuspid regurgitation signal is inadequate for assessing PA pressure. Left Atrium: Left atrial size was normal in size. Right Atrium: Right atrial size was normal in size. Pericardium: There is no evidence of pericardial effusion. Mitral Valve: The mitral valve is degenerative in appearance. Mild to moderate mitral annular calcification. Trivial mitral valve regurgitation. No evidence of mitral valve stenosis. Tricuspid Valve: The tricuspid valve is normal in structure. Tricuspid valve regurgitation is not demonstrated. No evidence of tricuspid stenosis. Aortic Valve: Calcified mass on the non-coronary cusp with some mobile material, measures 0.5 x 1.0 cm, could be old vegetation or thrombus. The aortic valve is tricuspid. There is severe thickening of the aortic valve. Aortic valve regurgitation is not visualized. Aortic valve sclerosis/calcification is present, without any evidence of aortic  stenosis. Pulmonic Valve: The pulmonic valve was normal in structure. Pulmonic valve regurgitation is not visualized. No evidence of pulmonic stenosis. Aorta: Aortic dilatation noted. There is mild dilatation of the ascending aorta, measuring 40 mm. Venous: The inferior vena cava is dilated in size with less than 50% respiratory variability, suggesting right atrial pressure of 15 mmHg. IAS/Shunts: No atrial level shunt detected by color flow Doppler. Additional Comments: 3D was performed not requiring image post processing on an independent workstation and was abnormal.  LEFT VENTRICLE PLAX 2D                        Biplane EF (MOD) LV EF:         Left            LV Biplane EF:   Left                ventricular                      ventricular                ejection                         ejection                fraction by                      fraction by                PLAX is 43  2D MOD                %.                               biplane is LVIDd:         4.70 cm                          51.5 %. LVIDs:         3.70 cm LV PW:         1.50 cm         Diastology LV IVS:        1.50 cm         LV e' medial:    3.81 cm/s LVOT diam:     2.30 cm         LV E/e' medial:  11.5 LV SV:         87              LV e' lateral:   3.70 cm/s LV SV Index:   45              LV E/e' lateral: 11.9 LVOT Area:     4.15 cm  LV Volumes (MOD) LV vol d, MOD    96.7 ml       3D Volume EF: A2C:                           3D EF:        46 % LV vol d, MOD    81.7 ml       LV EDV:       174 ml A4C:                           LV ESV:       95 ml LV vol s, MOD    44.3 ml       LV SV:        79 ml A2C: LV vol s, MOD    42.3 ml A4C: LV SV MOD A2C:   52.4 ml LV SV MOD A4C:   81.7 ml LV SV MOD BP:    46.9 ml RIGHT VENTRICLE             IVC RV S prime:     17.70 cm/s  IVC diam: 2.10 cm TAPSE (M-mode): 1.9 cm                             PULMONARY VEINS                             Diastolic Velocity: 27.90 cm/s                              S/D Velocity:       1.60                             Systolic Velocity:  44.10 cm/s LEFT ATRIUM             Index  RIGHT ATRIUM           Index LA diam:        3.90 cm 2.03 cm/m   RA Area:     11.40 cm LA Vol (A2C):   39.3 ml 20.47 ml/m  RA Volume:   24.10 ml  12.55 ml/m LA Vol (A4C):   42.8 ml 22.30 ml/m LA Biplane Vol: 41.7 ml 21.72 ml/m  AORTIC VALVE LVOT Vmax:   100.00 cm/s LVOT Vmean:  58.600 cm/s LVOT VTI:    0.210 m  AORTA Ao Root diam: 3.30 cm Ao Asc diam:  3.95 cm MITRAL VALVE MV Area (PHT): 2.32 cm    SHUNTS MV Decel Time: 327 msec    Systemic VTI:  0.21 m MV E velocity: 44.00 cm/s  Systemic Diam: 2.30 cm MV A velocity: 89.70 cm/s MV E/A ratio:  0.49 Vinie Maxcy MD Electronically signed by Vinie Maxcy MD Signature Date/Time: 04/15/2024/3:29:26 PM    Final      LOS: 4 days   Donalda Applebaum, MD  [facility]    To contact the attending provider between 7A-7P or the covering provider during after hours 7P-7A, please log into the web site [email or web address] and access using universal [place] password for that web site. If you do not have the password, please call the hospital operator.  04/17/2024, 11:25 AM

## 2024-04-18 ENCOUNTER — Inpatient Hospital Stay (HOSPITAL_COMMUNITY)

## 2024-04-18 DIAGNOSIS — A419 Sepsis, unspecified organism: Secondary | ICD-10-CM | POA: Diagnosis not present

## 2024-04-18 LAB — COMPREHENSIVE METABOLIC PANEL WITH GFR
ALT: 371 U/L — ABNORMAL HIGH (ref 0–44)
AST: 194 U/L — ABNORMAL HIGH (ref 15–41)
Albumin: 2.1 g/dL — ABNORMAL LOW (ref 3.5–5.0)
Alkaline Phosphatase: 118 U/L (ref 38–126)
Anion gap: 9 (ref 5–15)
BUN: 11 mg/dL (ref 8–23)
CO2: 26 mmol/L (ref 22–32)
Calcium: 8 mg/dL — ABNORMAL LOW (ref 8.9–10.3)
Chloride: 107 mmol/L (ref 98–111)
Creatinine, Ser: 0.35 mg/dL — ABNORMAL LOW (ref 0.44–1.00)
GFR, Estimated: >60 mL/min (ref >60)
Glucose, Bld: 84 mg/dL (ref 70–99)
Potassium: 3.4 mmol/L — ABNORMAL LOW (ref 3.5–5.1)
Sodium: 142 mmol/L (ref 135–145)
Total Bilirubin: 1.3 mg/dL — ABNORMAL HIGH (ref 0.0–1.2)
Total Protein: 5.7 g/dL — ABNORMAL LOW (ref 6.5–8.1)

## 2024-04-18 LAB — CBC
HCT: 26.5 % — ABNORMAL LOW (ref 36.0–46.0)
HCT: 26.2 % — ABNORMAL LOW (ref 36.0–46.0)
Hemoglobin: 9 g/dL — ABNORMAL LOW (ref 12.0–15.0)
Hemoglobin: 9 g/dL — ABNORMAL LOW (ref 12.0–15.0)
MCH: 29.7 pg (ref 26.0–34.0)
MCH: 30.8 pg (ref 26.0–34.0)
MCHC: 34 g/dL (ref 30.0–36.0)
MCHC: 34.4 g/dL (ref 30.0–36.0)
MCV: 87.5 fL (ref 80.0–100.0)
MCV: 89.7 fL (ref 80.0–100.0)
Platelets: 17 K/uL — CL (ref 150–400)
Platelets: 36 K/uL — ABNORMAL LOW (ref 150–400)
RBC: 3.03 MIL/uL — ABNORMAL LOW (ref 3.87–5.11)
RBC: 2.92 MIL/uL — ABNORMAL LOW (ref 3.87–5.11)
RDW: 15.6 % — ABNORMAL HIGH (ref 11.5–15.5)
RDW: 16.1 % — ABNORMAL HIGH (ref 11.5–15.5)
WBC: 4.5 K/uL (ref 4.0–10.5)
WBC: 4.2 K/uL (ref 4.0–10.5)
nRBC: 0.4 % — ABNORMAL HIGH (ref 0.0–0.2)
nRBC: 0.7 % — ABNORMAL HIGH (ref 0.0–0.2)

## 2024-04-18 LAB — PHOSPHORUS: Phosphorus: 1.9 mg/dL — ABNORMAL LOW (ref 2.5–4.6)

## 2024-04-18 LAB — BPAM PLATELET PHERESIS
Blood Product Expiration Date: 202512112359
ISSUE DATE / TIME: 202512101126
Unit Type and Rh: 5100

## 2024-04-18 LAB — CULTURE, BLOOD (ROUTINE X 2)
Culture: NO GROWTH
Culture: NO GROWTH
Special Requests: ADEQUATE

## 2024-04-18 LAB — GLUCOSE, CAPILLARY
Glucose-Capillary: 111 mg/dL — ABNORMAL HIGH (ref 70–99)
Glucose-Capillary: 121 mg/dL — ABNORMAL HIGH (ref 70–99)
Glucose-Capillary: 80 mg/dL (ref 70–99)
Glucose-Capillary: 81 mg/dL (ref 70–99)
Glucose-Capillary: 83 mg/dL (ref 70–99)
Glucose-Capillary: 84 mg/dL (ref 70–99)
Glucose-Capillary: 90 mg/dL (ref 70–99)

## 2024-04-18 LAB — PREPARE PLATELET PHERESIS: Unit division: 0

## 2024-04-18 LAB — MAGNESIUM: Magnesium: 1.8 mg/dL (ref 1.7–2.4)

## 2024-04-18 LAB — PROTIME-INR
INR: 1.2 (ref 0.8–1.2)
Prothrombin Time: 15.8 s — ABNORMAL HIGH (ref 11.4–15.2)

## 2024-04-18 MED ORDER — MAGNESIUM SULFATE 2 GM/50ML IV SOLN
2.0000 g | Freq: Once | INTRAVENOUS | Status: AC
  Administered 2024-04-18: 2 g via INTRAVENOUS
  Filled 2024-04-18: qty 50

## 2024-04-18 MED ORDER — GERHARDT'S BUTT CREAM
TOPICAL_CREAM | Freq: Two times a day (BID) | CUTANEOUS | Status: AC
  Administered 2024-04-19 – 2024-06-07 (×6): 1 via TOPICAL
  Filled 2024-04-18 (×3): qty 60

## 2024-04-18 MED ORDER — POTASSIUM PHOSPHATES 15 MMOLE/5ML IV SOLN
30.0000 mmol | Freq: Once | INTRAVENOUS | Status: AC
  Administered 2024-04-18: 30 mmol via INTRAVENOUS
  Filled 2024-04-18: qty 10

## 2024-04-18 MED ORDER — POTASSIUM CHLORIDE CRYS ER 20 MEQ PO TBCR
40.0000 meq | EXTENDED_RELEASE_TABLET | Freq: Once | ORAL | Status: AC
  Administered 2024-04-18: 40 meq via ORAL
  Filled 2024-04-18: qty 2

## 2024-04-18 NOTE — Progress Notes (Signed)
 Speech Language Pathology Treatment: Dysphagia  Patient Details Name: Merilee Wible MRN: [id number] DOB: 12-05-1945 Today's Date: 04/18/2024 Time: 9075-9054 SLP Time Calculation (min) (ACUTE ONLY): 21 min  Assessment / Plan / Recommendation Clinical Impression  Pt seen for diet tolerance session with puree breakfast tray and nectar-thick liquids (8oz) + mech ground solid provided by SLP. Recommend diet advanced to mechanical altered consistency with consistent use of alternating liquids and solids to see if this aids oral transit of softer solid. No overt coughing with PO intake, though occasional instance of delayed throat clearing observed post sips of nectar-thick liquids. Oral prep and transit continues to be prolonged with soft solid trials, though adequate oral clearance observed when pt was offered sips of nectar-thick liquids following each bite.   Plan for repeat MBSS later today to determine if liquids can be advanced. MBSS anticipated at noon today. SLP will follow.   HPI HPI: Ms. Hosking is a 78 y/o female presenting 04/14/24 after roommate called EMS as the patient was unresponsive. She reports chest pain and was very confused. CT no acute intracranial abnormality.  2. Stable right parietal cortical encephalomalacia     PMHx:  HTN, HLD, CVA left hemiparesis, CAPD, DVT, IVH. UA positive. SLP consulted for clinical swallow assessment. Pt advanced to a D2 diet and NTLs following MBSS on 04/15/24, though downgraded to puree now due to concerns for oral pocketing per nursing.      SLP Plan  Continue with current plan of care         Recommendations  Diet recommendations: Dysphagia 2 (fine chop);Nectar-thick liquid Liquids provided via: Straw;Cup Medication Administration: Crushed with puree Supervision: Full supervision/cueing for compensatory strategies;Staff to assist with self feeding Compensations: Slow rate;Small sips/bites;Follow solids with liquid;Clear throat after each  swallow Postural Changes and/or Swallow Maneuvers: Seated upright 90 degrees;Upright 30-60 min after meal                  Oral care BID   Frequent or constant Supervision/Assistance Dysphagia, oropharyngeal phase (R13.12)     Continue with current plan of care     [name]  04/18/2024, 10:40 AM

## 2024-04-18 NOTE — TOC Progression Note (Addendum)
 Transition of Care [facility]) - Progression Note    Patient Details  Name: [name] MRN: [id number] Date of Birth: 02/04/1946  Transition of [facility] CM/SW Contact  Fredna Stricker SHAUNNA [place], [place] Phone Number: 04/18/2024, 2:05 PM  Clinical Narrative:     CSW called daughter Shona multiple times today though have not received return call back.   CSW called son Auston who explains he is not familiar with the area and unable to make snf choice. He states he will tell Tianna to call CSW if he is able to get in touch with her.   Spoke with APS case worker Hildreth Sharps who also has not heard from pt's daughter. She requests to be notified if CSW cannot get a hold of daughter tomorrow.   Expected Discharge Plan: Skilled Nursing Facility Barriers to Discharge: Continued Medical Work up                                         Social Drivers of Health (SDOH) Interventions SDOH Screenings   Food Insecurity: Food Insecurity Present (04/15/2024)  Housing: High Risk (04/15/2024)  Transportation Needs: Unmet Transportation Needs (04/15/2024)  Utilities: At Risk (04/15/2024)  Depression (PHQ2-9): Low Risk (06/04/2021)  Social Connections: Unknown (04/15/2024)  Tobacco Use: Medium Risk (03/22/2024)    Readmission Risk Interventions    01/15/2024    2:24 PM  Readmission Risk Prevention Plan  Transportation Screening Complete  HRI or Home Care Consult Complete  Social Work Consult for Recovery Care Planning/Counseling Complete  Palliative Care Screening Not Applicable  Medication Review Oceanographer) Complete

## 2024-04-18 NOTE — Progress Notes (Signed)
 PROGRESS NOTE        PATIENT DETAILS Name: [name] Age: 78 y.o. Sex: female Date of Birth: January 26, 1946 Admit Date: 04/13/2024 Admitting Physician Tamela Stakes, MD ERE:Bzmryzrx, Rojelio PARAS, NP  Brief Summary: Patient is a 78 y.o.  female with history of CVA, COPD, HTN-who was brought to the ED after she was found by her roommate unresponsive (no heat/light-under a single blanket)-she was apparently covered in feces/dried blood-found to be hypothermic/hypotensive/hypoglycemic-she was thought to have undifferentiated shock-acute liver failure-acute kidney injury-admitted to the ICU-stabilized with pressors/D10 infusion/broad-spectrum antibiotics-rapidly improved and subsequently transferred to [facility].  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 12/8>> ANA: Positive 12/8>> anti-smooth muscle antibody: Negative  Significant microbiology data: 12/6>> COVID/influenza/RSV PCR: Negative 12/6>> blood culture: No growth 12/6>> acute hepatitis serology: Negative 12/6>> CMV DNA PCR: Negative 12/9>> blood culture: No growth  Procedures: None  Consults: PCCM GI ID Hematology  Subjective: Unchanged-no major issues overnight-appears frail-no nausea, vomiting or diarrhea.  Objective: Vitals: Blood pressure (!) 102/57, pulse 66, temperature 98.3 F (36.8 C),  temperature source Oral, resp. rate 15, height 5' 10 (1.778 m), weight 78.3 kg, SpO2 97%.   Exam: Chronically sick appearing but awake/alert-not in any distress Chest: Clear to auscultation CVS: S1-S2 regular Abdomen: Soft nontender nondistended Extremities: Trace edema Chronic left hemiparesis-generalized weakness  Pertinent Labs/Radiology:    Latest Ref Rng & Units 04/18/2024    4:15 AM 04/17/2024    3:51 PM 04/17/2024   10:17 AM  CBC  WBC 4.0 - 10.5 K/uL 4.2  4.2    Hemoglobin 12.0 - 15.0 g/dL 9.0  9.3    Hematocrit 36.0 - 46.0 % 26.2  27.8    Platelets 150 - 400 K/uL 36  53  17     Lab Results  Component Value Date   NA 142 04/18/2024   K 3.4 (L) 04/18/2024   CL 107 04/18/2024   CO2 26 04/18/2024      Assessment/Plan: Undifferentiated shock Unclear etiology-initially thought to have septic shock but all cultures negative to date-has rapidly improved-initially on admission to the ICU was started on vancomycin /Zosyn  but all antibiotics were discontinued on 12/7.  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  Plans were to obtain TEE but per cardiology-unable to safely perform TEE unless platelet count persistently above 50K.    Acute metabolic encephalopathy Secondary to hypothermia/hypoglycemia/undifferentiated shock/acute liver failure Head CT without acute abnormalities Seems to have clinically improved after treatment of underlying etiologies.  Hypothermia Likely secondary to environmental exposure (per prior notes- cool room with single blanket-no heating or light) TSH stable Now normothermic after supportive care  Hypoglycemia Likely secondary to acute liver injury Decrease D10 to 10 cc an hour-encourage oral intake-attempt to titrate off D10 infusion  Recent Labs     04/18/24 0404 04/18/24 0730 04/18/24 1108  GLUCAP 81 83 111*     Acute liver failure Unclear etiology-possibly shock liver from hypotension from possible sepsis physiology LFTs/coags improving Dopplers without portal/hepatic vein thrombosis Acute hepatitis serology negative CMV IgM positive but viral load negative  Awaiting anti-smooth muscle antibody GI following-completed N-acetylcysteine  12/10 Empirically on lactulose   Thrombocytopenia Likely secondary to acute liver injury/failure-possible sepsis physiology Continue to worsen in spite of iver/sepsis physiology-given 1 unit of pheresis platelets on 12/10 as platelet count was <20K.   Per hematology review-no schistocytes-not thought to have TTP-unlikely to be ITP as well Plan is to provide supportive care and follow  Normocytic anemia Likely secondary to critical illness Continue to trend/follow CBC  Oropharyngeal dysphagia Secondary to critical illness/encephalopathy SLP following-on dysphagia 2 diet.  AKI Hemodynamically mediated due to hypotension Resolved.  Hypokalemia/Hypophosphatemia Replete/recheck  Metabolic acidosis Likely due to delayed clearance of lactic acid-in the setting of liver failure. Improved with supportive care  Self-limited episode-small-volume coffee-ground emesis Hb stable PPI GI following-no endoscopic procedures planned at this point  Chest pain Likely atypical Troponins minimally elevated-echo with stable EF Supportive care for now  Minimally elevated troponins Trend is flat Likely demand ischemia in the setting of hypotension/critical illness. Echo with able EF  HTN BP stable without use of any antihypertensives.  History of chronic HFpEF Status stable  History of CVA Baseline left hemiparesis Antiplatelets/statin held-in the setting of elevated liver enzymes and thrombocytopenia.  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 (Active)     Wound 04/14/24 0100 Pressure Injury Buttocks Left Unstageable - Full thickness tissue loss in which the base of the injury is covered by slough (yellow, tan, gray, green or brown) and/or eschar (tan, brown or black) in the wound bed. (Active)     Wound 04/14/24 0100 Pressure Injury Sacrum Medial Stage 2 -  Partial thickness loss of dermis presenting as a shallow open injury with a red, pink wound bed without slough. (Active)    Code status:   Code Status: Full Code   DVT Prophylaxis: SCDs Start: 04/13/24 2344   Family Communication: Daughter-[phone] left VM 12/10   Disposition Plan: Status is: Inpatient Remains inpatient appropriate because: Severity of illness   Planned Discharge Destination:Skilled nursing facility   Diet: Diet Order             DIET DYS 2 Fluid consistency: Thin  Diet effective now                     Antimicrobial agents: Anti-infectives (From admission, onward)    Start     Dose/Rate Route Frequency Ordered Stop   04/16/24 1415  DAPTOmycin  (CUBICIN ) IVPB 700 mg/100mL premix        700 mg 200 mL/hr over 30 Minutes Intravenous Daily 04/16/24 1324     04/16/24 1415  cefTRIAXone  (ROCEPHIN ) 2 g in sodium chloride  0.9 % 100 mL IVPB        2 g 200 mL/hr over 30 Minutes Intravenous Daily 04/16/24 1324  04/14/24 0600  piperacillin -tazobactam (ZOSYN ) IVPB 3.375 g  Status:  Discontinued        3.375 g 12.5 mL/hr over 240 Minutes Intravenous Every 8 hours 04/14/24 0132 04/15/24 0920   04/13/24 2045   ceFEPIme  (MAXIPIME ) 2 g in sodium chloride  0.9 % 100 mL IVPB        2 g 200 mL/hr over 30 Minutes Intravenous  Once 04/13/24 2030 04/13/24 2147   04/13/24 2045  metroNIDAZOLE  (FLAGYL ) IVPB 500 mg        500 mg 100 mL/hr over 60 Minutes Intravenous  Once 04/13/24 2030 04/13/24 2310   04/13/24 2045  vancomycin  (VANCOCIN ) IVPB 1000 mg/200 mL premix  Status:  Discontinued        1,000 mg 200 mL/hr over 60 Minutes Intravenous  Once 04/13/24 2030 04/13/24 2032   04/13/24 2045  vancomycin  (VANCOREADY) IVPB 1750 mg/350 mL        1,750 mg 175 mL/hr over 120 Minutes Intravenous  Once 04/13/24 2032 04/13/24 2349        MEDICATIONS: Scheduled Meds:  lactulose   20 g Oral TID   multivitamin with minerals  1 tablet Oral Daily   mouth rinse  15 mL Mouth Rinse 4 times per day   pantoprazole  (PROTONIX ) IV  40 mg Intravenous Q12H   thiamine  100 mg Oral Daily   Continuous Infusions:  cefTRIAXone  (ROCEPHIN )  IV 2 g (04/17/24 1313)   DAPTOmycin  700 mg (04/17/24 1319)   dextrose  10 mL/hr at 04/18/24 0400   potassium PHOSPHATE IVPB (in mmol) 30 mmol (04/18/24 1145)   PRN Meds:.docusate, ipratropium-albuterol , morphine  injection, ondansetron  (ZOFRAN ) IV, mouth rinse, polyethylene glycol   I have personally reviewed following labs and imaging studies  LABORATORY DATA: CBC: Recent Labs  Lab 04/13/24 2123 04/13/24 2132 04/15/24 0248 04/16/24 0345 04/17/24 0447 04/17/24 1017 04/17/24 1551 04/18/24 0415  WBC 6.8   < > 5.7 4.0 4.5  4.5  --  4.2 4.2  NEUTROABS 6.2  --   --   --  2.3  --   --   --   HGB 11.9*   < > 9.6* 9.4* 9.0*  9.0*  --  9.3* 9.0*  HCT 36.4   < > 28.9* 27.1* 26.5*  26.4*  --  27.8* 26.2*  MCV 90.5   < > 89.8 88.3 87.5  88.3  --  88.8 89.7  PLT 56*   < > 43*  41* 34* 17*  18* 17* 53* 36*   < > = values in this interval not displayed.    Basic Metabolic Panel: Recent Labs  Lab 04/14/24 0957 04/14/24 1958 04/15/24 0248 04/16/24 0345 04/17/24 0447 04/18/24 0415   NA 146* 143 145 145 142 142  K 2.7* 3.5 4.9 2.8* 2.9* 3.4*  CL 110 110 114* 112* 110 107  CO2 18* 20* 21* 17* 28 26  GLUCOSE 88 167* 147* 131* 104* 84  BUN 42* 40* 38* 23 15 11   CREATININE 1.59* 1.43* 1.27* 0.80 0.56 0.35*  CALCIUM  7.4* 7.4* 7.4* 8.0* 7.9* 8.0*  MG 1.7  --  1.7 1.8 2.1 1.8  PHOS  --   --   --   --  <1.0* 1.9*    GFR: Estimated Creatinine Clearance: 60.7 mL/min (A) (by C-G formula based on SCr of 0.35 mg/dL (L)).  Liver Function Tests: Recent Labs  Lab 04/14/24 1958 04/15/24 0248 04/16/24 0345 04/17/24 0447 04/18/24 0415  AST 1,795* 1,475* 548* 219* 194*  ALT 1,236* 1,157* 734*  481* 371*  ALKPHOS 82 90 93 104 118  BILITOT 2.0* 2.0* 1.8* 1.2 1.3*  PROT 5.3* 5.5* 6.0* 5.3* 5.7*  ALBUMIN  1.9* 1.8* 2.3* 1.9* 2.1*   Recent Labs  Lab 04/13/24 2123  LIPASE 106*   Recent Labs  Lab 04/14/24 0005 04/14/24 0957  AMMONIA 50* 60*    Coagulation Profile: Recent Labs  Lab 04/15/24 1617 04/16/24 0345 04/17/24 0447 04/17/24 1017 04/18/24 0415  INR 1.4* 1.5* 1.3* 1.3* 1.2    Cardiac Enzymes: Recent Labs  Lab 04/15/24 0955 04/17/24 0447  CKTOTAL 232 91    BNP (last 3 results) Recent Labs    01/11/24 2300  PROBNP 3,648.0*    Lipid Profile: No results for input(s): CHOL, HDL, LDLCALC, TRIG, CHOLHDL, LDLDIRECT in the last 72 hours.  Thyroid  Function Tests: No results for input(s): TSH, T4TOTAL, FREET4, T3FREE, THYROIDAB in the last 72 hours.   Anemia Panel: No results for input(s): VITAMINB12, FOLATE, FERRITIN, TIBC, IRON, RETICCTPCT in the last 72 hours.  Urine analysis:    Component Value Date/Time   COLORURINE AMBER (A) 04/13/2024 2041   APPEARANCEUR CLOUDY (A) 04/13/2024 2041   LABSPEC 1.025 04/13/2024 2041   PHURINE 5.0 04/13/2024 2041   GLUCOSEU NEGATIVE 04/13/2024 2041   HGBUR SMALL (A) 04/13/2024 2041   BILIRUBINUR SMALL (A) 04/13/2024 2041   KETONESUR NEGATIVE 04/13/2024 2041   PROTEINUR  100 (A) 04/13/2024 2041   UROBILINOGEN 1.0 11/15/2010 2015   NITRITE NEGATIVE 04/13/2024 2041   LEUKOCYTESUR NEGATIVE 04/13/2024 2041    Sepsis Labs: Lactic Acid, Venous    Component Value Date/Time   LATICACIDVEN 4.5 (HH) 04/15/2024 1538    MICROBIOLOGY: Recent Results (from the past 240 hours)  MRSA Next Gen by PCR, Nasal     Status: None   Collection Time: 04/13/24 12:51 AM   Specimen: Nasal Mucosa; Nasal Swab  Result Value Ref Range Status   MRSA by PCR Next Gen NOT DETECTED NOT DETECTED Final    Comment: (NOTE) The GeneXpert MRSA Assay (FDA approved for NASAL specimens only), is one component of a comprehensive MRSA colonization surveillance program. It is not intended to diagnose MRSA infection nor to guide or monitor treatment for MRSA infections. Test performance is not FDA approved in patients less than 41 years old. Performed at [facility], [address] [place], [place] [address]   Resp panel by RT-PCR (RSV, Flu A&B, Covid) Anterior Nasal Swab     Status: None   Collection Time: 04/13/24  8:30 PM   Specimen: Anterior Nasal Swab  Result Value Ref Range Status   SARS Coronavirus 2 by RT PCR NEGATIVE NEGATIVE Final   Influenza A by PCR NEGATIVE NEGATIVE Final   Influenza B by PCR NEGATIVE NEGATIVE Final    Comment: (NOTE) The Xpert Xpress SARS-CoV-2/FLU/RSV plus assay is intended as an aid in the diagnosis of influenza from Nasopharyngeal swab specimens and should not be used as a sole basis for treatment. Nasal washings and aspirates are unacceptable for Xpert Xpress SARS-CoV-2/FLU/RSV testing.  Fact Sheet for Patients: bloggercourse.com  Fact Sheet for Healthcare Providers: seriousbroker.it  This test is not yet approved or cleared by the [place]  FDA and has been authorized for detection and/or diagnosis of SARS-CoV-2 by FDA under an Emergency Use Authorization (EUA). This EUA will remain in  effect (meaning this test can be used) for the duration of the COVID-19 declaration under Section 564(b)(1) of the Act, 21 U.S.C. section 360bbb-3(b)(1), unless the authorization is terminated or  revoked.     Resp Syncytial Virus by PCR NEGATIVE NEGATIVE Final    Comment: (NOTE) Fact Sheet for Patients: bloggercourse.com  Fact Sheet for Healthcare Providers: seriousbroker.it  This test is not yet approved or cleared by the [place]  FDA and has been authorized for detection and/or diagnosis of SARS-CoV-2 by FDA under an Emergency Use Authorization (EUA). This EUA will remain in effect (meaning this test can be used) for the duration of the COVID-19 declaration under Section 564(b)(1) of the Act, 21 U.S.C. section 360bbb-3(b)(1), unless the authorization is terminated or revoked.  Performed at [facility], [address] [place], [place] [address]   Blood Culture (routine x 2)     Status: None   Collection Time: 04/13/24  8:30 PM   Specimen: BLOOD  Result Value Ref Range Status   Specimen Description BLOOD SITE NOT SPECIFIED  Final   Special Requests   Final    BOTTLES DRAWN AEROBIC AND ANAEROBIC Blood Culture adequate volume   Culture   Final    NO GROWTH 5 DAYS Performed at [facility], [address] [place], [place] [address]    Report Status 04/18/2024 FINAL  Final  Blood Culture (routine x 2)     Status: None   Collection Time: 04/13/24  8:35 PM   Specimen: BLOOD LEFT FOREARM  Result Value Ref Range Status   Specimen Description BLOOD LEFT FOREARM  Final   Special Requests   Final    BOTTLES DRAWN AEROBIC AND ANAEROBIC Blood Culture results may not be optimal due to an inadequate volume of blood received in culture bottles   Culture   Final    NO GROWTH 5 DAYS Performed at [facility], [address], [place], [place] [address]    Report Status 04/18/2024 FINAL  Final  Culture, blood  (Routine X 2) w Reflex to ID Panel     Status: None (Preliminary result)   Collection Time: 04/16/24 11:42 AM   Specimen: BLOOD  Result Value Ref Range Status   Specimen Description BLOOD SITE NOT SPECIFIED  Final   Special Requests   Final    BOTTLES DRAWN AEROBIC AND ANAEROBIC Blood Culture adequate volume   Culture   Final    NO GROWTH 2 DAYS Performed at [facility], [address] [place], [place] [address]    Report Status PENDING  Incomplete  Culture, blood (Routine X 2) w Reflex to ID Panel     Status: None (Preliminary result)   Collection Time: 04/16/24  2:56 PM   Specimen: BLOOD RIGHT ARM  Result Value Ref Range Status   Specimen Description BLOOD RIGHT ARM  Final   Special Requests   Final    BOTTLES DRAWN AEROBIC AND ANAEROBIC Blood Culture adequate volume   Culture   Final    NO GROWTH 2 DAYS Performed at [facility], [address], [place], [place] [address]    Report Status PENDING  Incomplete    RADIOLOGY STUDIES/RESULTS: No results found.    LOS: 5 days   Donalda Applebaum, MD  [facility]    To contact the attending provider between 7A-7P or the covering provider during after hours 7P-7A, please log into the web site [email or web address] and access using universal [place] password for that web site. If you do not have the password, please call the hospital operator.  04/18/2024, 2:09 PM

## 2024-04-18 NOTE — Plan of Care (Signed)
°  Problem: Clinical Measurements: Goal: Ability to maintain clinical measurements within normal limits will improve Outcome: Progressing Goal: Diagnostic test results will improve Outcome: Progressing Goal: Cardiovascular complication will be avoided Outcome: Progressing   Problem: Education: Goal: Knowledge of General Education information will improve Description: Including pain rating scale, medication(s)/side effects and non-pharmacologic comfort measures Outcome: Not Progressing   Problem: Health Behavior/Discharge Planning: Goal: Ability to manage health-related needs will improve Outcome: Not Progressing

## 2024-04-18 NOTE — Procedures (Signed)
 Modified Barium Swallow Study  Patient Details  Name: [name] MRN: [id number] Date of Birth: 1945-05-13  Today's Date: 04/18/2024  Modified Barium Swallow completed.  Full report located under Chart Review in the Imaging Section.  History of Present Illness [name] is a 78 y/o female presenting 04/14/24 after roommate called EMS as the patient was unresponsive. She reports chest pain and was very confused. CT no acute intracranial abnormality.  2. Stable right parietal cortical encephalomalacia     PMHx:  HTN, HLD, CVA left hemiparesis, CAPD, DVT, IVH. UA positive. SLP consulted for clinical swallow assessment. Pt advanced to a D2 diet and NTLs following MBSS on 04/15/24, though downgraded to puree now due to concerns for oral pocketing per nursing.   Clinical Impression  Pt presents with a mild oropharyngeal dysphagia per MBSS completed today. Results demonstrated improvement in oropharyngeal swallow function and airway protection compared to MBSS completed on 04/15/24. Recommend a mechanical altered diet and thin liquids as tolerated with meds whole in applesauce.   Oral phase: continue to note generalized reduced oral strength in coordination, which resulted in posterior spillage of liquids to the level of the pyriform sinuses before the swallow. No anterior loss observed today. Pt continues to demonstrate reduced oral efficiency with min oral residue observed after initial swallow of both liquids and pudding. A second (cued) dried swallow significantly reduced or cleared residue.   Pharyngeal phase characterized by reduced base of tongue retraction, reduced laryngeal vestibule closure, reduced pharyngeal stripping, and reduced distention of UES.   Esophageal phase: there was evidence of lower esophageal retention of liquids and possibly pudding residue, elevating pt's risk of backflow and post prandial aspiration. Recommend further esophogeal assessment and possible GI consult as  indicated.   Findings:  -No aspiration observed across trials. -There was trace transient and stagnant penetration of thin liquids by cup and straw before/during the swallow. Pt able to clear penetrated residue with cued throat clear + dry swallow.  -There was min-mod diffuse pharyngeal residue (BOT, valleculae, and pyriform sinuses) post sips of thin liquids. Residue reduced with cued dry swallow.   Plan: SLP will follow up to assess diet tolerance and modify or advance diet as indicated. Continue SLP PoC.   Factors that may increase risk of adverse event in presence of aspiration [name] & Lianne 2021): Reduced cognitive function;Limited mobility;Frail or deconditioned;Dependence for feeding and/or oral hygiene;Inadequate oral hygiene  Swallow Evaluation Recommendations Recommendations: PO diet PO Diet Recommendation: Dysphagia 2 (Finely chopped);Thin liquids (Level 0) Liquid Administration via: Cup;Straw Medication Administration: Whole meds with puree Supervision: Staff to assist with self-feeding;Full supervision/cueing for swallowing strategies Swallowing strategies  : Slow rate;Small bites/sips;Follow solids with liquids;Multiple dry swallows after each bite/sip Postural changes: Position pt fully upright for meals;Stay upright 30-60 min after meals Oral care recommendations: Oral care BID (2x/day) Recommended consults: Consider esophageal assessment      [name] 04/18/2024,12:45 PM

## 2024-04-19 DIAGNOSIS — D696 Thrombocytopenia, unspecified: Secondary | ICD-10-CM

## 2024-04-19 DIAGNOSIS — K7200 Acute and subacute hepatic failure without coma: Secondary | ICD-10-CM | POA: Diagnosis not present

## 2024-04-19 DIAGNOSIS — R6521 Severe sepsis with septic shock: Secondary | ICD-10-CM | POA: Diagnosis not present

## 2024-04-19 DIAGNOSIS — L8990 Pressure ulcer of unspecified site, unspecified stage: Secondary | ICD-10-CM

## 2024-04-19 DIAGNOSIS — I330 Acute and subacute infective endocarditis: Secondary | ICD-10-CM

## 2024-04-19 LAB — COMPREHENSIVE METABOLIC PANEL WITH GFR
ALT: 313 U/L — ABNORMAL HIGH (ref 0–44)
AST: 157 U/L — ABNORMAL HIGH (ref 15–41)
Albumin: 2.2 g/dL — ABNORMAL LOW (ref 3.5–5.0)
Alkaline Phosphatase: 127 U/L — ABNORMAL HIGH (ref 38–126)
Anion gap: 7 (ref 5–15)
BUN: 10 mg/dL (ref 8–23)
CO2: 27 mmol/L (ref 22–32)
Calcium: 8.1 mg/dL — ABNORMAL LOW (ref 8.9–10.3)
Chloride: 108 mmol/L (ref 98–111)
Creatinine, Ser: 0.43 mg/dL — ABNORMAL LOW (ref 0.44–1.00)
GFR, Estimated: >60 mL/min (ref >60)
Glucose, Bld: 85 mg/dL (ref 70–99)
Potassium: 3.9 mmol/L (ref 3.5–5.1)
Sodium: 142 mmol/L (ref 135–145)
Total Bilirubin: 0.9 mg/dL (ref 0.0–1.2)
Total Protein: 5.7 g/dL — ABNORMAL LOW (ref 6.5–8.1)

## 2024-04-19 LAB — GLUCOSE, CAPILLARY
Glucose-Capillary: 106 mg/dL — ABNORMAL HIGH (ref 70–99)
Glucose-Capillary: 74 mg/dL (ref 70–99)
Glucose-Capillary: 79 mg/dL (ref 70–99)
Glucose-Capillary: 79 mg/dL (ref 70–99)
Glucose-Capillary: 81 mg/dL (ref 70–99)
Glucose-Capillary: 85 mg/dL (ref 70–99)

## 2024-04-19 LAB — PROTIME-INR
INR: 1.1 (ref 0.8–1.2)
Prothrombin Time: 14.9 s (ref 11.4–15.2)

## 2024-04-19 LAB — PHOSPHORUS: Phosphorus: 2.6 mg/dL (ref 2.5–4.6)

## 2024-04-19 LAB — CBC
HCT: 24.8 % — ABNORMAL LOW (ref 36.0–46.0)
Hemoglobin: 8.3 g/dL — ABNORMAL LOW (ref 12.0–15.0)
MCH: 29.9 pg (ref 26.0–34.0)
MCHC: 33.5 g/dL (ref 30.0–36.0)
MCV: 89.2 fL (ref 80.0–100.0)
Platelets: 29 K/uL — CL (ref 150–400)
RBC: 2.78 MIL/uL — ABNORMAL LOW (ref 3.87–5.11)
RDW: 16.1 % — ABNORMAL HIGH (ref 11.5–15.5)
WBC: 4.7 K/uL (ref 4.0–10.5)
nRBC: 0.9 % — ABNORMAL HIGH (ref 0.0–0.2)

## 2024-04-19 LAB — MAGNESIUM: Magnesium: 2.2 mg/dL (ref 1.7–2.4)

## 2024-04-19 MED ORDER — PANTOPRAZOLE SODIUM 40 MG PO TBEC
40.0000 mg | DELAYED_RELEASE_TABLET | Freq: Two times a day (BID) | ORAL | Status: AC
  Administered 2024-04-19 – 2024-06-14 (×104): 40 mg via ORAL
  Filled 2024-04-19 (×95): qty 1

## 2024-04-19 MED ORDER — IMMUNE GLOBULIN (HUMAN) 10 GM/100ML IV SOLN
1.0000 g/kg | INTRAVENOUS | Status: AC
  Administered 2024-04-19 – 2024-04-20 (×2): 70 g via INTRAVENOUS
  Filled 2024-04-19 (×2): qty 700

## 2024-04-19 NOTE — Progress Notes (Addendum)
 [name]   DOB:October 20, 1945   FM#:[id number]      ASSESSMENT & PLAN:   [name] is an 78 year old female patient admitted on 04/13/2024 with complaints of altered mental status.  Hematologic history is significant for thrombocytopenia and anemia.  Therefore hematology is following.   Thrombocytopenia - Platelets with initial response status post platelet transfusion.  However has been on the downtrend for the last 2 days.  Platelets today 29K.   - May be secondary to acute illness and infection.  Immature platelet count is not elevated which does not support ITP although ITP is not ruled out.  TTP is ruled out. - Patient has history of thrombocytopenia and was seen in outpatient hematology office on 12/11/2023 for her platelets were noted to be mildly low 80K at that time. - Platelets smear WNL - Hold on steroids at this time given possible endocarditis. - Ordered IVIG 1 g/kg daily x 2 beginning today. - May consider Nplate. - Recommend platelet transfusion for platelet count <20 K or <50 K with acute bleeding. - Monitor closely for bleeding.  No bleeding noted at this time - Continue to monitor CBC with differential - Hematology/Dr. Lanny following.   Altered mental status - CT of head is negative - Likely secondary to liver failure and comorbidities - Appears to be improving. - Continue supportive care   Anemia History of GI bleed - Hemoglobin 8.3 today - Patient admitted August 2025 with GI bleed.  Hemoglobin had dropped to 5 and she received PRBC transfusions. - Recommend PRBC transfusion for hemoglobin <7.0 - Continue to monitor CBC with differential   Acute liver failure - Unclear etiology. - Dopplers negative for thrombosis, showed steatosis. - Total bili and LFTs continue to improve with bili WNL.   - Avoid hepatotoxic agents - Monitor hepatic function   History of B12 deficiency - Received B12 injections August 2025.  Recommended at that time continuation of weekly  B12 injections however patient no-showed for outpatient appointments.   History of CVA - Reports that she feels okay and denies any headaches or chest pain.   - Continue supportive care    Code Status Full  Subjective:  Patient seen resting comfortably in bed.  Awakens to verbal stimuli.  She is very hard of hearing.  States that she feels fine.  Denies pain at this time.  Patient made aware that IVIG is ordered and she is agreeable to receiving.  No other acute distress is noted.  Objective:   Intake/Output Summary (Last 24 hours) at 04/19/2024 1216 Last data filed at 04/19/2024 0500 Gross per 24 hour  Intake 1282.75 ml  Output --  Net 1282.75 ml     PHYSICAL EXAMINATION: ECOG PERFORMANCE STATUS: 4 - Bedbound  Vitals:   04/19/24 0400 04/19/24 1157  BP: (!) 141/61   Pulse: (!) 54   Resp: 19   Temp: 98.5 F (36.9 C) 97.8 F (36.6 C)  SpO2: 99%    Filed Weights   04/13/24 2023 04/14/24 0100 04/18/24 0400  Weight: 187 lb 9.8 oz (85.1 kg) 164 lb 3.9 oz (74.5 kg) 172 lb 9.9 oz (78.3 kg)    GENERAL: alert, + very hard of hearing + chronically ill-appearing SKIN: + Left hip and sacral decubitus ulcers EYES: normal, conjunctiva are pink and non-injected, sclera clear OROPHARYNX: no exudate, no erythema and lips, buccal mucosa, and tongue normal  NECK: supple, thyroid  normal size, non-tender, without nodularity LYMPH: no palpable lymphadenopathy in the cervical, axillary or inguinal LUNGS:  clear to auscultation and percussion with normal breathing effort HEART: regular rate & rhythm and no murmurs and no lower extremity edema ABDOMEN: abdomen soft, non-tender and normal bowel sounds MUSCULOSKELETAL: + Difficulty ambulating PSYCH: alert & oriented x 3 with fluent speech NEURO: no focal motor/sensory deficits   All questions were answered. The patient knows to call the clinic with any problems, questions or concerns.   The total time spent in the appointment was 40  minutes encounter with patient including review of chart and various tests results, discussions about plan of care and coordination of care plan  Olam JINNY Brunner, NP 04/19/2024 12:16 PM    Labs Reviewed:  Lab Results  Component Value Date   WBC 4.7 04/19/2024   HGB 8.3 (L) 04/19/2024   HCT 24.8 (L) 04/19/2024   MCV 89.2 04/19/2024   PLT 29 (LL) 04/19/2024   Recent Labs    04/14/24 0957 04/14/24 1958 04/15/24 0248 04/17/24 0447 04/18/24 0415 04/19/24 0244  NA 146* 143   < > 142 142 142  K 2.7* 3.5   < > 2.9* 3.4* 3.9  CL 110 110   < > 110 107 108  CO2 18* 20*   < > 28 26 27   GLUCOSE 88 167*   < > 104* 84 85  BUN 42* 40*   < > 15 11 10   CREATININE 1.59* 1.43*   < > 0.56 0.35* 0.43*  CALCIUM  7.4* 7.4*   < > 7.9* 8.0* 8.1*  GFRNONAA 33* 37*   < > >60 >60 >60  PROT 5.4* 5.3*   < > 5.3* 5.7* 5.7*  ALBUMIN  1.9* 1.9*   < > 1.9* 2.1* 2.2*  AST 2,619* 1,795*   < > 219* 194* 157*  ALT 1,437* 1,236*   < > 481* 371* 313*  ALKPHOS 82 82   < > 104 118 127*  BILITOT 2.2* 2.0*   < > 1.2 1.3* 0.9  BILIDIR 1.0* 1.0*  --   --   --   --   IBILI 1.2* 1.0*  --   --   --   --    < > = values in this interval not displayed.    Studies Reviewed:   US  ABD LTD RUG W/LIVER DOPPLER Result Date: 04/16/2024 EXAM: RIGHT UPPER QUADRANT ULTRASOUND WITH DOPPLER EVALUATION 04/15/2024 11:50:00 PM COMPARISON: Right upper quadrant ultrasound 04/14/2024. CT 04/13/2024. CLINICAL HISTORY: Elevated LFTs. FINDINGS: LIVER: Increased echotexture throughout the liver is compatible with fatty infiltration. No evidence of intrahepatic biliary ductal dilatation. OTHER: No evidence of right upper quadrant ascites. Vascular/Doppler: Doppler interrogation of the abdominal vasculature was performed. There is normal Doppler flow in the normal direction of the hepatic vasculature. Normal hepatopetal flow of the hepatic artery and portal vein. Normal hepatofugal flow of the hepatic veins. Normal Doppler waveforms are visualized.  IMPRESSION: 1. Hepatic steatosis. 2. No evidence of hepatic vein or portal vein thrombosis. Electronically signed by: Franky Crease MD 04/16/2024 01:23 AM EST RP Workstation: [id number]   DG Swallowing Func-Speech Pathology Result Date: 04/15/2024 Table formatting from the original result was not included. Modified Barium Swallow Study Patient Details Name: Cerena Baine MRN: [id number] Date of Birth: 10-Oct-1945 Today's Date: 04/15/2024 HPI/PMH: HPI: Ms. Knoche is a 78 y/o female presenting 04/14/24 after roommate called EMS as the patient was unresponsive. She reports chest pain and was very confused. Pt found under single blanket, alone with no heat or lights on in home.   PMHx:  HTN, HLD, CVA left  hemiparesis, CAPD, DVT, IVH. UA positive. SLP consulted for clinical swallow assessment. Clinical Impression: Pt presents with a mild-moderate oropharyngeal dysphagia per MBSS completed today. Recommend a mechanical altered/ground diet with nectar-thick liquids (straw ok), and meds crushed in applesauce. Oral deficits included reduced oral strength and coordination resulting in intermittent anterior loss of liquids by spoon or cup sip. Pt impulsively drank all liquids and was unable to try single sips despite cues. There was consistent posterior loss of thin and nectar-thick liquids to the level of the pyriform sinuses pre swallow with some pooling. There was piecemeal swallow of pudding and softened cracker. Min cracker residue on hard palate cleared with prompted nectar-thick liquid wash. Pharyngeal deficits included reduced base of tongue retraction, reduced laryngeal vestibule closure, reduced pharyngeal stripping, and reduced distention of UES. Findings -There was transient aspiration of thin liquid residue during cleansing swallows. Residue was ejected to the level of the vocal cords (PAS-5) or the laryngeal vestibule (PAS-3) during swallow, which was inaudible. No stagnant aspiration in the airway observed. -There x1  instance of stagnant penetration of nectar-thick liquid wash post cracker. Penetrated residue did start to progress to level of the vocal cords (PAS3 - 5); though ejected with cued throat clear + swallow. All other penetration instances of nectar-thick liquids were transient. -There was min diffuse pharyngeal residue following liquids and pudding. A moderate amount of pharyngeal residue observed post cracker trial, which was cleared with cued dry swallow. Plan: SLP will follow up to assess diet tolerance and modify diet as indicated. Pt may benefit from a repeat MBSS prior to liquid advancement given that inaudible responses to penetration/aspiration events. Factors that may increase risk of adverse event in presence of aspiration Noe & Lianne 2021): Factors that may increase risk of adverse event in presence of aspiration Noe & Lianne 2021): Reduced cognitive function; Limited mobility; Frail or deconditioned; Dependence for feeding and/or oral hygiene; Inadequate oral hygiene; Reduced saliva; Weak cough Recommendations/Plan: Swallowing Evaluation Recommendations Swallowing Evaluation Recommendations Recommendations: PO diet PO Diet Recommendation: Dysphagia 2 (Finely chopped); Mildly thick liquids (Level 2, nectar thick) Liquid Administration via: Cup; Straw Medication Administration: Crushed with puree Supervision: Staff to assist with self-feeding; Full supervision/cueing for swallowing strategies Swallowing strategies  : Minimize environmental distractions; Slow rate; Small bites/sips; Clear throat intermittently; Follow solids with liquids Postural changes: Position pt fully upright for meals Oral care recommendations: Oral care BID (2x/day) Treatment Plan Treatment Plan Treatment recommendations: Therapy as outlined in treatment plan below Follow-up recommendations: Skilled nursing-short term rehab (<3 hours/day) Functional status assessment: Patient has had a recent decline in their functional  status and demonstrates the ability to make significant improvements in function in a reasonable and predictable amount of time. Treatment frequency: Min 1x/week Treatment duration: 2 weeks Interventions: Diet toleration management by SLP; Trials of upgraded texture/liquids; Patient/family education; Compensatory techniques Recommendations Recommendations for follow up therapy are one component of a multi-disciplinary discharge planning process, led by the attending physician.  Recommendations may be updated based on patient status, additional functional criteria and insurance authorization. Assessment: Orofacial Exam: Orofacial Exam Oral Cavity: Oral Hygiene: Dried secretions Oral Cavity - Dentition: Poor condition; Missing dentition Orofacial Anatomy: WFL Oral Motor/Sensory Function: Unable to test Anatomy: Anatomy: WFL Boluses Administered: Boluses Administered Boluses Administered: Thin liquids (Level 0); Mildly thick liquids (Level 2, nectar thick); Puree; Solid  Oral Impairment Domain: Oral Impairment Domain Lip Closure: Escape from interlabial space or lateral juncture, no extension beyond vermillion border Tongue control during bolus hold: Posterior escape  of greater than half of bolus Bolus preparation/mastication: Disorganized chewing/mashing with solid pieces of bolus unchewed Bolus transport/lingual motion: Slow tongue motion Oral residue: Residue collection on oral structures Location of oral residue : Tongue Initiation of pharyngeal swallow : Pyriform sinuses  Pharyngeal Impairment Domain: Pharyngeal Impairment Domain Soft palate elevation: No bolus between soft palate (SP)/pharyngeal wall (PW) Laryngeal elevation: Complete superior movement of thyroid  cartilage with complete approximation of arytenoids to epiglottic petiole Anterior hyoid excursion: Complete anterior movement Epiglottic movement: Complete inversion Laryngeal vestibule closure: Incomplete, narrow column air/contrast in laryngeal  vestibule Pharyngeal stripping wave : Present - diminished Pharyngeal contraction (A/P view only): N/A Pharyngoesophageal segment opening: Partial distention/partial duration, partial obstruction of flow Tongue base retraction: Narrow column of contrast or air between tongue base and PPW Pharyngeal residue: Collection of residue within or on pharyngeal structures Location of pharyngeal residue: Tongue base; Valleculae; Pyriform sinuses  Esophageal Impairment Domain: Esophageal Impairment Domain Esophageal clearance upright position: Esophageal retention Pill: No data recorded Penetration/Aspiration Scale Score: Penetration/Aspiration Scale Score 1.  Material does not enter airway: Puree; Solid 2.  Material enters airway, remains ABOVE vocal cords then ejected out: Mildly thick liquids (Level 2, nectar thick) 3.  Material enters airway, remains ABOVE vocal cords and not ejected out: Mildly thick liquids (Level 2, nectar thick); Thin liquids (Level 0) 5.  Material enters airway, CONTACTS cords and not ejected out: Thin liquids (Level 0) 6.  Material enters airway, passes BELOW cords then ejected out: Thin liquids (Level 0) Compensatory Strategies: Compensatory Strategies Compensatory strategies: Yes Multiple swallows: Effective (when pt able to follow cue) Effective Multiple Swallows: Solid Chin tuck: -- (unable to attempt) Other(comment): Effective (throat clear+ swallow to eject penetration) Effective Other(comment): Mildly thick liquid (Level 2, nectar thick)   General Information: Caregiver present: No  Diet Prior to this Study: NPO   Temperature : Normal   Respiratory Status: WFL   Supplemental O2: None (Room air)   History of Recent Intubation: No  Behavior/Cognition: Alert; Cooperative [facility]) Self-Feeding Abilities: Needs set-up for self-feeding Baseline vocal quality/speech: Hypophonia/low volume Volitional Cough: Unable to elicit (occasional throat clear elicited) Volitional Swallow: Able to elicit (delayed)  No data recorded Goal Planning: Prognosis for improved oropharyngeal function: Fair No data recorded No data recorded Patient/Family Stated Goal: none stated Consulted and agree with results and recommendations: Patient; Nurse Pain: Pain Assessment Pain Assessment: No/denies pain Pain Score: 0 Faces Pain Scale: 2 Facial Expression: 0 Body Movements: 0 Muscle Tension: 0 Compliance with ventilator (intubated pts.): N/A Vocalization (extubated pts.): 0 CPOT Total: 0 Pain Location: grimacing with mobility tasks Pain Descriptors / Indicators: Grimacing End of Session: Start Time:SLP Start Time (ACUTE ONLY): 1438 Stop Time: SLP Stop Time (ACUTE ONLY): 1500 Time Calculation:SLP Time Calculation (min) (ACUTE ONLY): 22 min Charges: SLP Evaluations $ SLP Speech Visit: 1 Visit SLP Evaluations $MBS Swallow: 1 Procedure SLP visit diagnosis: SLP Visit Diagnosis: Dysphagia, oropharyngeal phase (R13.12) Past Medical History: Past Medical History: Diagnosis Date  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 (HCC) 09/16/2020  COPD (chronic obstructive pulmonary disease) (HCC)   History of DVT (deep vein thrombosis)   Hypertension   Thoracoabdominal aortic aneurysm   TIA (transient ischemic attack) 09/10/2020 Past Surgical History: Past Surgical History: Procedure Laterality Date  IR BONE MARROW BIOPSY & ASPIRATION  12/13/2023  IR CT HEAD LTD  09/11/2020  IR INTRAVSC STENT CERV CAROTID W/O EMB-PROT MOD SED INC ANGIO  09/11/2020  IR PERCUTANEOUS ART THROMBECTOMY/INFUSION INTRACRANIAL INC DIAG ANGIO  09/11/2020     IR PERCUTANEOUS ART THROMBECTOMY/INFUSION INTRACRANIAL INC DIAG ANGIO  09/11/2020  IR US  GUIDE VASC ACCESS RIGHT  09/11/2020  NO PAST SURGERIES    RADIOLOGY WITH ANESTHESIA N/A 09/11/2020  Procedure: IR WITH ANESTHESIA;  Surgeon: Dolphus Carrion, MD;  Location: [facility] OR;  Service: Radiology;  Laterality: N/A; [name] 04/15/2024, 3:33 PM  ECHOCARDIOGRAM COMPLETE Result Date:  04/15/2024    ECHOCARDIOGRAM REPORT   Patient Name:   BRANDILYNN TAORMINA Date of Exam: 04/15/2024 Medical Rec #:  [id number]   Height:       70.0 in Accession #:    [id number]  Weight:       164.2 lb Date of Birth:  1946/02/08  BSA:          1.920 m Patient Age:    78 years    BP:           134/57 mmHg Patient Gender: F           HR:           56 bpm. Exam Location:  Inpatient Procedure: 2D Echo, Cardiac Doppler and Color Doppler (Both Spectral and Color            Flow Doppler were utilized during procedure). Indications:    Shock  History:        Patient has prior history of Echocardiogram examinations, most                 recent 01/12/2024. Stroke, Signs/Symptoms:Altered Mental Status;                 Risk Factors:Dyslipidemia and Hypertension. ICH.  Sonographer:    Ellouise Mose RDCS Referring Phys: [phone] RAKESH V ALVA IMPRESSIONS  1. Left ventricular ejection fraction, by estimation, is 50 to 55%. Left ventricular ejection fraction by 2D MOD biplane is 51.5 %. Left ventricular ejection fraction by PLAX is 43 %. The left ventricle has low normal function. The left ventricle demonstrates global hypokinesis. There is moderate left ventricular hypertrophy. Left ventricular diastolic parameters are consistent with Grade I diastolic dysfunction (impaired relaxation).  2. Right ventricular systolic function is normal. The right ventricular size is normal. Tricuspid regurgitation signal is inadequate for assessing PA pressure.  3. The mitral valve is degenerative. Trivial mitral valve regurgitation. No evidence of mitral stenosis.  4. Calcified mass on the non-coronary cusp with some mobile material, measures 0.5 x 1.0 cm, could be old vegetation or thrombus. The aortic valve is tricuspid. There is severe thickening of the aortic valve. Aortic valve regurgitation is not visualized. Aortic valve sclerosis/calcification is present, without any evidence of aortic stenosis.  5. Aortic dilatation noted. There is mild dilatation of the  ascending aorta, measuring 40 mm.  6. The inferior vena cava is dilated in size with <50% respiratory variability, suggesting right atrial pressure of 15 mmHg. Comparison(s): Changes from prior study are noted. 01/12/2024: LVEF 60-65%. FINDINGS  Left Ventricle: Left ventricular ejection fraction, by estimation, is 50 to 55%. Left ventricular ejection fraction by PLAX is 43 %. Left ventricular ejection fraction by 2D MOD biplane is 51.5 %. The left ventricle has low normal function. The left ventricle demonstrates global hypokinesis. 3D ejection fraction reviewed and evaluated as part of the interpretation. Alternate measurement of EF is felt to be most reflective of LV function. The left ventricular internal cavity size was normal in size. There is moderate left ventricular hypertrophy. Left ventricular diastolic parameters are consistent with Grade I diastolic dysfunction (  impaired relaxation). Indeterminate filling pressures. Right Ventricle: The right ventricular size is normal. No increase in right ventricular wall thickness. Right ventricular systolic function is normal. Tricuspid regurgitation signal is inadequate for assessing PA pressure. Left Atrium: Left atrial size was normal in size. Right Atrium: Right atrial size was normal in size. Pericardium: There is no evidence of pericardial effusion. Mitral Valve: The mitral valve is degenerative in appearance. Mild to moderate mitral annular calcification. Trivial mitral valve regurgitation. No evidence of mitral valve stenosis. Tricuspid Valve: The tricuspid valve is normal in structure. Tricuspid valve regurgitation is not demonstrated. No evidence of tricuspid stenosis. Aortic Valve: Calcified mass on the non-coronary cusp with some mobile material, measures 0.5 x 1.0 cm, could be old vegetation or thrombus. The aortic valve is tricuspid. There is severe thickening of the aortic valve. Aortic valve regurgitation is not visualized. Aortic valve  sclerosis/calcification is present, without any evidence of aortic stenosis. Pulmonic Valve: The pulmonic valve was normal in structure. Pulmonic valve regurgitation is not visualized. No evidence of pulmonic stenosis. Aorta: Aortic dilatation noted. There is mild dilatation of the ascending aorta, measuring 40 mm. Venous: The inferior vena cava is dilated in size with less than 50% respiratory variability, suggesting right atrial pressure of 15 mmHg. IAS/Shunts: No atrial level shunt detected by color flow Doppler. Additional Comments: 3D was performed not requiring image post processing on an independent workstation and was abnormal.  LEFT VENTRICLE PLAX 2D                        Biplane EF (MOD) LV EF:         Left            LV Biplane EF:   Left                ventricular                      ventricular                ejection                         ejection                fraction by                      fraction by                PLAX is 43                       2D MOD                %.                               biplane is LVIDd:         4.70 cm                          51.5 %. LVIDs:         3.70 cm LV PW:         1.50 cm         Diastology LV IVS:        1.50 cm  LV e' medial:    3.81 cm/s LVOT diam:     2.30 cm         LV E/e' medial:  11.5 LV SV:         87              LV e' lateral:   3.70 cm/s LV SV Index:   45              LV E/e' lateral: 11.9 LVOT Area:     4.15 cm  LV Volumes (MOD) LV vol d, MOD    96.7 ml       3D Volume EF: A2C:                           3D EF:        46 % LV vol d, MOD    81.7 ml       LV EDV:       174 ml A4C:                           LV ESV:       95 ml LV vol s, MOD    44.3 ml       LV SV:        79 ml A2C: LV vol s, MOD    42.3 ml A4C: LV SV MOD A2C:   52.4 ml LV SV MOD A4C:   81.7 ml LV SV MOD BP:    46.9 ml RIGHT VENTRICLE             IVC RV S prime:     17.70 cm/s  IVC diam: 2.10 cm TAPSE (M-mode): 1.9 cm                             PULMONARY VEINS                              Diastolic Velocity: 27.90 cm/s                             S/D Velocity:       1.60                             Systolic Velocity:  44.10 cm/s LEFT ATRIUM             Index        RIGHT ATRIUM           Index LA diam:        3.90 cm 2.03 cm/m   RA Area:     11.40 cm LA Vol (A2C):   39.3 ml 20.47 ml/m  RA Volume:   24.10 ml  12.55 ml/m LA Vol (A4C):   42.8 ml 22.30 ml/m LA Biplane Vol: 41.7 ml 21.72 ml/m  AORTIC VALVE LVOT Vmax:   100.00 cm/s LVOT Vmean:  58.600 cm/s LVOT VTI:    0.210 m  AORTA Ao Root diam: 3.30 cm Ao Asc diam:  3.95 cm MITRAL VALVE MV Area (PHT): 2.32 cm    SHUNTS MV Decel Time: 327 msec    Systemic VTI:  0.21 m MV E velocity: 44.00 cm/s  Systemic Diam:  2.30 cm MV A velocity: 89.70 cm/s MV E/A ratio:  0.49 Vinie Maxcy MD Electronically signed by Vinie Maxcy MD Signature Date/Time: 04/15/2024/3:29:26 PM    Final    DG Abd Portable 1V Result Date: 04/14/2024 EXAM: 1 VIEW XRAY OF THE ABDOMEN 04/14/2024 06:02:00 PM COMPARISON: Same day knees. CLINICAL HISTORY: Confusion. FINDINGS: LINES, TUBES AND DEVICES: Nasogastric tube tip has been advanced into body of stomach and is in grossly good position. BOWEL: Nonobstructive bowel gas pattern. SOFT TISSUES: No abnormal calcifications. BONES: No acute fracture. IMPRESSION: 1. Nasogastric tube tip projects over the body of the stomach in expected position. Electronically signed by: Lynwood Seip MD 04/14/2024 06:13 PM EST RP Workstation: [id number]   DG Abd 1 View Result Date: 04/14/2024 EXAM: 1 VIEW XRAY OF THE ABDOMEN 04/14/2024 12:00:00 PM COMPARISON: None available. CLINICAL HISTORY: Nasogastric tube present. FINDINGS: LINES, TUBES AND DEVICES: Enteric tube in place with tip projecting over the stomach. Side port projecting over the distal esophagus. BOWEL: Nonobstructive bowel gas pattern. SOFT TISSUES: Atherosclerotic calcifications and abdominal aortic aneurysm. BONES: No acute fracture. IMPRESSION: 1. Enteric tube in  place with tip projecting over the stomach and side port projecting over the distal esophagus. Recommend advancing the tube so the side port is beyond the gastroesophageal junction. 2. Abdominal aortic aneurysm with atherosclerotic calcifications. This is described in greater detail on CT of the abdomen and pelvis without contrast 04/13/2024 . Electronically signed by: Lonni Necessary MD 04/14/2024 04:27 PM EST RP Workstation: [id number]   US  RENAL Result Date: 04/14/2024 EXAM: US  Retroperitoneum Complete, Renal. 04/14/2024 12:20:00 PM TECHNIQUE: Real-time ultrasonography of the retroperitoneum renal was performed. COMPARISON: None available CLINICAL HISTORY: Acute kidney failure. FINDINGS: FINDINGS: RIGHT KIDNEY/URETER: Right kidney measures 10.5 x 5.5 x 5.0 cm. Normal cortical echogenicity. No hydronephrosis. No calculus. No mass. LEFT KIDNEY/URETER: Left kidney measures 10.5 x 5.3 x 4.9 cm. Normal cortical echogenicity. No hydronephrosis. No calculus. No mass. BLADDER: Urinary bladder is decompressed secondary to foley catheter. IMPRESSION: 1. No acute findings. Electronically signed by: Lynwood Seip MD 04/14/2024 12:50 PM EST RP Workstation: [id number]   US  Abdomen Limited RUQ (LIVER/GB) Result Date: 04/14/2024 EXAM: Right Upper Quadrant Abdominal Ultrasound TECHNIQUE: Real-time ultrasonography of the right upper quadrant of the abdomen was performed. COMPARISON: None available. CLINICAL HISTORY: LFT elevation. FINDINGS: LIVER: Increased hepatic steatosis. No intrahepatic biliary ductal dilatation. No mass. BILIARY SYSTEM: No evidence of pericholecystic fluid or wall thickening. No cholelithiasis. Common bile duct is within normal limits measuring 2.3 mm. RIGHT KIDNEY: The right kidney is grossly unremarkable in appearances without evidence of hydronephrosis, echogenic calculi or worrisome mass lesions. OTHER: No right upper quadrant ascites. IMPRESSION: 1. Hepatic steatosis. Electronically signed by:  Morgane Naveau MD 04/14/2024 12:40 AM EST RP Workstation: [id number]   DG Chest Port 1 View Result Date: 04/13/2024 EXAM: 1 VIEW(S) XRAY OF THE CHEST 04/13/2024 08:39:00 PM COMPARISON: 01/11/2024. CLINICAL HISTORY: Questionable sepsis - evaluate for abnormality FINDINGS: LUNGS AND PLEURA: Left pleural calcifications noted. No focal pulmonary opacity. No pleural effusion. No pneumothorax. HEART AND MEDIASTINUM: Aortic atherosclerosis. BONES AND SOFT TISSUES: No acute osseous abnormality. IMPRESSION: 1. No acute cardiopulmonary abnormality. 2. Left pleural calcifications. 3. Aortic atherosclerosis. Electronically signed by: Franky Crease MD 04/13/2024 09:40 PM EST RP Workstation: [id number]   CT CHEST ABDOMEN PELVIS WO CONTRAST Result Date: 04/13/2024 EXAM: CT CHEST, ABDOMEN AND PELVIS WITHOUT CONTRAST 04/13/2024 08:59:36 PM TECHNIQUE: CT of the chest, abdomen and pelvis was performed without the administration of intravenous contrast. Multiplanar reformatted images are provided  for review. Automated exposure control, iterative reconstruction, and/or weight based adjustment of the mA/kV was utilized to reduce the radiation dose to as low as reasonably achievable. COMPARISON: 01/12/2024 CLINICAL HISTORY: abd pain FINDINGS: CHEST: MEDIASTINUM AND LYMPH NODES: Heart and pericardium are unremarkable. Diffuse coronary artery and aortic atherosclerosis. The central airways are clear. No mediastinal, hilar or axillary lymphadenopathy. LUNGS AND PLEURA: Like scarring in the left lower lobe posteriorly. Calcified pleural plaques in the left hemithorax, stable. No focal consolidation or pulmonary edema. No pleural effusion or pneumothorax. ABDOMEN AND PELVIS: LIVER: The liver is unremarkable. GALLBLADDER AND BILE DUCTS: Gallbladder is unremarkable. No biliary ductal dilatation. SPLEEN: No acute abnormality. PANCREAS: No acute abnormality. ADRENAL GLANDS: No acute abnormality. KIDNEYS, URETERS AND BLADDER: Foley catheter in  the bladder, which is decompressed. No stones in the kidneys or ureters. No hydronephrosis. No perinephric or periureteral stranding. GI AND BOWEL: Stomach demonstrates no acute abnormality. Normal appendix. There is no bowel obstruction. REPRODUCTIVE ORGANS: No acute abnormality. PERITONEUM AND RETROPERITONEUM: No ascites. No free air. VASCULATURE: Infrarenal abdominal aortic aneurysm measures up to 4.2 cm. 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 Diffuse aortoiliac atherosclerosis. ABDOMINAL AND PELVIS LYMPH NODES: No lymphadenopathy. BONES AND SOFT TISSUES: No acute osseous abnormality. No focal soft tissue abnormality. IMPRESSION: 1. 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 Diffuse aortoiliac atherosclerosis; recommend CT or MRI in 12 months and establish or continue care with a vascular specialist. 2. Coronary artery disease. 3. Calcified pleural plaques on the left. Scarring at the left lung base. Electronically signed by: Franky Crease MD 04/13/2024 09:06 PM EST RP Workstation: [id number]   CT Head Wo Contrast Result Date: 04/13/2024 EXAM: CT HEAD WITHOUT CONTRAST 04/13/2024 08:58:58 PM TECHNIQUE: CT of the head was performed without the administration of intravenous contrast. Automated exposure control, iterative reconstruction, and/or weight based adjustment of the mA/kV was utilized to reduce the radiation dose to as low as reasonably achievable. COMPARISON: 12/10/2023 CLINICAL HISTORY: delirium FINDINGS: BRAIN AND VENTRICLES: No acute hemorrhage. No evidence of acute infarct. Age-related cerebral and cerebellar volume loss. Remote encephalomalacia in right parietal lobe. Mild periventricular white matter disease. Ex vacuo dilatation of right lateral ventricle. No extra-axial collection. No mass effect or midline shift. Moderate calcific atheromatous disease within carotid siphons and vertebral arteries.  ORBITS: No acute abnormality. SINUSES: Moderate bilateral ethmoid sinus disease. Bilateral maxillary sinus disease with air-fluid levels. Mild bilateral sphenoid and frontal sinus disease. SOFT TISSUES AND SKULL: No acute soft tissue abnormality. No skull fracture. IMPRESSION: 1. No acute intracranial abnormality. 2. Stable right parietal cortical encephalomalacia. 3. Stable age-related changes. 4. Bilateral maxillary sinus disease with air-fluid levels, with additional ethmoid, sphenoid, and frontal sinus involvement, suggestive of acute sinusitis. Electronically signed by: Dorethia Molt MD 04/13/2024 09:05 PM EST RP Workstation: [id number]   DG Hip Unilat W or Wo Pelvis 2-3 Views Left Result Date: 03/22/2024 EXAM: 2 or 3 VIEW(S) XRAY OF THE LEFT HIP 03/22/2024 10:26:31 PM COMPARISON: 12/10/2023. CLINICAL HISTORY: hip pain, no trauma hip pain, no trauma FINDINGS: BONES AND JOINTS: No acute fracture or focal osseous lesion. The hip joint is maintained. No significant degenerative changes. SOFT TISSUES: The soft tissues are unremarkable. IMPRESSION: 1. No significant abnormality in the left hip or visualized pelvis. Electronically signed by: Dorethia Molt MD 03/22/2024 10:30 PM EST RP Workstation: [id number]    Addendum I have seen the patient, examined her. I agree with the  assessment and and plan and have edited the notes.   Lab reviewed, she responded well to platelet transfusion, however platelet is trending down again.  Blood culture has been negative.  TEE is not able to perform unless platelet count above 50K.  Although I do think her thrombocytopenia is likely related to sepsis, but ITP is not ruled out, given the persistent severe thrombocytopenia, will give IVIG for 2 days.  If she does not respond well by Monday, will give 1 dose Nplate. She has no bleeding. Please monitor CBC daily. We will continue f/u.  Onita Mattock MD 04/19/2024

## 2024-04-19 NOTE — Plan of Care (Signed)

## 2024-04-19 NOTE — TOC Progression Note (Signed)
 Transition of Care [facility]) - Progression Note    Patient Details  Name: [name] MRN: [id number] Date of Birth: 1946/03/10  [facility] CM/SW Contact  Luann SHAUNNA [place], [place] Phone Number: 04/19/2024, 1:44 PM  Clinical Narrative:     Pt's daughter Shona apparently lost her cell phone on the bus. Tianna called nursing station who gave her CSW's #. Tianna called CSW and CSW provided update. Explained SNF choice is needed. Tianna choose [facility].   CSW is awaiting confirmation from [facility]  Expected Discharge Plan: Skilled Nursing Facility Barriers to Discharge: Continued Medical Work up               Expected Discharge Plan and Services                                               Social Drivers of Health (SDOH) Interventions SDOH Screenings   Food Insecurity: Food Insecurity Present (04/15/2024)  Housing: High Risk (04/15/2024)  Transportation Needs: Unmet Transportation Needs (04/15/2024)  Utilities: At Risk (04/15/2024)  Depression (PHQ2-9): Low Risk (06/04/2021)  Social Connections: Unknown (04/15/2024)  Tobacco Use: Medium Risk (03/22/2024)    Readmission Risk Interventions    01/15/2024    2:24 PM  Readmission Risk Prevention Plan  Transportation Screening Complete  HRI or Home Care Consult Complete  Social Work Consult for Recovery Care Planning/Counseling Complete  Palliative Care Screening Not Applicable  Medication Review [employer]) Complete

## 2024-04-19 NOTE — Progress Notes (Signed)
 Physical Therapy Treatment Patient Details Name: [name] MRN: [id number] DOB: 16-Jan-1946 Today's Date: 04/19/2024   History of Present Illness Pt is a 78 y/o female presenting 04/14/24 after roommate called EMS as the patient was unresponsive. She reports chest pain and was very confused. Pt found under single blanket, alone with no heat or lights on in home. PMHx:  HTN, HLD, CVA left hemiparesis, CAPD, DVT, IVH. UA positive.    PT Comments  Pt demonstrates increased alertness this session and better tolerance to sitting upright. Supine exercises performed prior to functional mobility to promote strengthening and blood flow. Pt overall tolerated well but had some difficulty with L LE due to weakness and hip pain. Pt requires maxA for supine to sit transfer. Pt uses hand rail on R side for trunk support. Min-modA for trunk support provided while sitting EOB. Pt note to be pushing to the L, which was inconsistently able to be corrected with manual assist but was unable to be maintained.  Pt tolerated sitting EOB ~10 minutes. Pt did well with fwd/backward weight shifts while sitting EOB. Pt would benefit from continued PT services focused on strengthening, bed mobility, and progressing transfers as appropriate to promote functional mobility.    If plan is discharge home, recommend the following: A lot of help with walking and/or transfers;A lot of help with bathing/dressing/bathroom;Assistance with cooking/housework;Direct supervision/assist for medications management;Direct supervision/assist for financial management;Assist for transportation;Supervision due to cognitive status;Help with stairs or ramp for entrance   Can travel by private vehicle     No  Equipment Recommendations  Rolling walker (2 wheels);BSC/3in1;Wheelchair (measurements PT);Wheelchair cushion (measurements PT);Hospital bed;Hoyer lift    Recommendations for Other Services       Precautions / Restrictions  Precautions Precautions: Fall Recall of Precautions/Restrictions: Impaired Restrictions Weight Bearing Restrictions Per Provider Order: No     Mobility  Bed Mobility Overal bed mobility: Needs Assistance Bed Mobility: Supine to Sit, Sit to Supine     Supine to sit: Max assist Sit to supine: Mod assist   General bed mobility comments: Pt attempts to reahc for railing with R hand but is unable to produce the force to push trunk into upright position. Assist needed for LE and trunk management.    Transfers                   General transfer comment: STS not attempted at this time due to fatigue and poor trunk control.    Ambulation/Gait                   Stairs             Wheelchair Mobility     Tilt Bed    Modified Rankin (Stroke Patients Only)       Balance Overall balance assessment: Needs assistance Sitting-balance support: Single extremity supported Sitting balance-Leahy Scale: Fair Sitting balance - Comments: Pt noted to be pushing to the L while sitting EOB. Inconsistently follows cues to sit in neutral position but does not maintain. Resists movement when assist is attempted to shift to the R. Forward/backward weight shifts completed x10 with good trunk control. Pt with increased difficulty initiating lateral weight shifts but improved slightly with assist. Postural control: Left lateral lean                                  Communication Communication Communication: Impaired Factors Affecting Communication: Difficulty expressing  self;Reduced clarity of speech  Cognition Arousal: Alert Behavior During Therapy: WFL for tasks assessed/performed   PT - Cognitive impairments: Memory, Awareness, Safety/Judgement, Sequencing, Problem solving                       PT - Cognition Comments: Pt not always receptive to cues provided. Following commands: Impaired Following commands impaired: Follows one step commands  inconsistently    Cueing Cueing Techniques: Verbal cues, Tactile cues, Visual cues  Exercises General Exercises - Lower Extremity Ankle Circles/Pumps: AAROM, Left, 10 reps, Supine (AROM R LE) Short Arc Quad: AAROM, Left, 10 reps, Supine (AROM R LE) Heel Slides: AAROM, Left, 5 reps, Supine (AAROM R LE) Hip Flexion/Marching: AAROM, Both, 5 reps, Supine    General Comments General comments (skin integrity, edema, etc.): No new skin abnormalities noted. Edema dorsum of L hand.      Pertinent Vitals/Pain Pain Assessment Pain Assessment: Faces Faces Pain Scale: Hurts little more Pain Location: L hip and hand Pain Descriptors / Indicators: Aching, Sore Pain Intervention(s): Limited activity within patient's tolerance, Repositioned, Monitored during session    Home Living                          Prior Function            PT Goals (current goals can now be found in the care plan section) Progress towards PT goals: Progressing toward goals    Frequency    Min 2X/week      PT Plan      Co-evaluation              AM-PAC PT 6 Clicks Mobility   Outcome Measure  Help needed turning from your back to your side while in a flat bed without using bedrails?: A Lot Help needed moving from lying on your back to sitting on the side of a flat bed without using bedrails?: A Lot Help needed moving to and from a bed to a chair (including a wheelchair)?: Total Help needed standing up from a chair using your arms (e.g., wheelchair or bedside chair)?: Total Help needed to walk in hospital room?: Total Help needed climbing 3-5 steps with a railing? : Total 6 Click Score: 8    End of Session Equipment Utilized During Treatment: Gait belt Activity Tolerance: Patient tolerated treatment well Patient left: in bed;with call bell/phone within reach;with bed alarm set Nurse Communication: Mobility status PT Visit Diagnosis: Unsteadiness on feet (R26.81);Muscle weakness  (generalized) (M62.81);Other abnormalities of gait and mobility (R26.89) Hemiplegia - Right/Left: Left Hemiplegia - dominant/non-dominant: Non-dominant Hemiplegia - caused by: Cerebral infarction     Time: 8555-8485 PT Time Calculation (min) (ACUTE ONLY): 30 min  Charges:    $Therapeutic Exercise: 8-22 mins $Therapeutic Activity: 8-22 mins PT General Charges $$ ACUTE PT VISIT: 1 Visit                     Sabra Morel, PT, DPT  Acute Rehabilitation Services         Office: [phone]      Sabra MARLA Morel 04/19/2024, 4:01 PM

## 2024-04-19 NOTE — Progress Notes (Signed)
 Speech Language Pathology Treatment: Dysphagia  Patient Details Name: [name] MRN: [id number] DOB: May 21, 1945 Today's Date: 04/19/2024 Time: 9163-9143 SLP Time Calculation (min) (ACUTE ONLY): 20 min  Assessment / Plan / Recommendation Clinical Impression  Pt seen with mechanical altered breakfast tray and thin liquids. Pt consumed ~40% of meal tray including scrambled eggs, minced sausage, pureed breakfast potatoes, magic cup, and thin liquids (6-7oz) via straw. Mastication efforts continues to be prolonged, especially with scrambled eggs. Trace-min oral residue observed post swallow. SLP prompted sips of thin liquids and/or bites of pureed solid to aid with oral clearance, which was successful. Observed occasional wet throat clearing. Pt benefited from verbal cues to clear throat + dry swallow every few bites/sips to aid with pharyngeal clearance due to pharyngeal residue seen on MBSS.   Recommend continue current D2 diet and thin liquids as tolerated. Utilize strategies such as liquid wash or puree wash following bites of solids. Prompt pt to clear throat + dry swallow every x2-3 bites/sips.   Plan: SLP will follow for ongoing diet tolerance assessment and further advancement as indicated.    HPI HPI: [name] is a 78 y/o female presenting 04/14/24 after roommate called EMS as the patient was unresponsive. She reports chest pain and was very confused. CT no acute intracranial abnormality.  2. Stable right parietal cortical encephalomalacia     PMHx:  HTN, HLD, CVA left hemiparesis, CAPD, DVT, IVH. UA positive. SLP consulted for clinical swallow assessment. Most recent MBSS completed on 04/18/24 with recommendations of a D2 diet and thin liquids.      SLP Plan  Continue with current plan of care         Recommendations  Diet recommendations: Dysphagia 2 (fine chop);Thin liquid Liquids provided via: Straw;Cup Medication Administration: Whole meds with puree Supervision: Full  supervision/cueing for compensatory strategies;Staff to assist with self feeding Compensations: Slow rate;Small sips/bites;Clear throat intermittently Postural Changes and/or Swallow Maneuvers: Seated upright 90 degrees;Upright 30-60 min after meal                  Oral care BID   Frequent or constant Supervision/Assistance Dysphagia, oropharyngeal phase (R13.12)     Continue with current plan of care     [name]  04/19/2024, 9:08 AM

## 2024-04-19 NOTE — Progress Notes (Signed)
 PROGRESS NOTE        PATIENT DETAILS Name: [name] Age: 78 y.o. Sex: female Date of Birth: 09/14/1945 Admit Date: 04/13/2024 Admitting Physician Tamela Stakes, MD ERE:Bzmryzrx, Rojelio PARAS, NP  Brief Summary: Patient is a 78 y.o.  female with history of CVA, COPD, HTN-who was brought to the ED after she was found by her roommate unresponsive (no heat/light-under a single blanket)-she was apparently covered in feces/dried blood-found to be hypothermic/hypotensive/hypoglycemic-she was thought to have undifferentiated shock-acute liver failure-acute kidney injury-admitted to the ICU-stabilized with pressors/D10 infusion/broad-spectrum antibiotics-rapidly improved and subsequently transferred to [facility].  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 12/8>> ANA: Positive 12/8>> anti-smooth muscle antibody: Negative  Significant microbiology data: 12/6>> COVID/influenza/RSV PCR: Negative 12/6>> blood culture: No growth 12/6>> acute hepatitis serology: Negative 12/6>> CMV DNA PCR: Negative 12/9>> blood culture: No growth  Procedures: None  Consults: PCCM GI ID Hematology  Subjective: Unchanged-intermittently confused but if we give her time she will answer questions appropriately.  Objective: Vitals: Blood pressure (!) 141/61, pulse (!) 54, temperature 97.8 F  (36.6 C), temperature source Oral, resp. rate 19, height 5' 10 (1.778 m), weight 78.3 kg, SpO2 99%.   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   Pertinent Labs/Radiology:    Latest Ref Rng & Units 04/19/2024    2:44 AM 04/18/2024    4:15 AM 04/17/2024    3:51 PM  CBC  WBC 4.0 - 10.5 K/uL 4.7  4.2  4.2   Hemoglobin 12.0 - 15.0 g/dL 8.3  9.0  9.3   Hematocrit 36.0 - 46.0 % 24.8  26.2  27.8   Platelets 150 - 400 K/uL 29  36  53     Lab Results  Component Value Date   NA 142 04/19/2024   K 3.9 04/19/2024   CL 108 04/19/2024   CO2 27 04/19/2024      Assessment/Plan: Undifferentiated shock Unclear etiology-initially thought to have septic shock but all cultures negative to date-has rapidly improved-initially on admission to the ICU was started on vancomycin /Zosyn  but all antibiotics were discontinued on 12/7.  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  Plans were to obtain TEE but per cardiology-unable to safely perform TEE unless platelet count persistently above 50K.    Acute metabolic encephalopathy Secondary to hypothermia/hypoglycemia/undifferentiated shock/acute liver failure Head CT without acute abnormalities Seems to have clinically improved after treatment of underlying etiologies.  Hypothermia Likely secondary to environmental exposure (per prior notes- cool room with single blanket-no heating or light) TSH stable Now normothermic after supportive care  Hypoglycemia Likely secondary to acute liver injury CBGs remain borderline-on D10-down  to 10 cc an hour-Will stop today and see how she  does.  Recent Labs    04/19/24 0528 04/19/24 0817 04/19/24 1155  GLUCAP 79 81 106*     Acute liver failure Unclear etiology-possibly shock liver from hypotension from possible sepsis physiology LFTs/coags improving Dopplers without portal/hepatic vein thrombosis Acute hepatitis serology negative CMV IgM positive but viral load negative  Negative workup for autoimmune hepatitis GI following-completed N-acetylcysteine  12/10 Empirically on lactulose   Thrombocytopenia Likely secondary to acute liver injury/failure-possible sepsis physiology-however no improvement in spite of improvement in her sepsis physiology and liver injury. Per hematology review-no schistocytes-not thought to hav TTP-initially not thought to have ITP. Given 1 unit of platelets on 12/10 as her platelet count was<20K, platelet count downtrending again-reevaluated by hematology on 12/12-plans to restart IVIG x 2 doses-for potential ITP. Await further recommendations from hematology service  Normocytic anemia Likely secondary to critical illness Continue to trend/follow CBC  Oropharyngeal dysphagia Secondary to critical illness/encephalopathy SLP following-on dysphagia 2 diet.  AKI Hemodynamically mediated due to hypotension Resolved.  Hypokalemia/Hypophosphatemia Replete/recheck  Metabolic acidosis Likely due to delayed clearance of lactic acid-in the setting of liver failure. Improved with supportive care  Self-limited episode-small-volume coffee-ground emesis Hb stable PPI GI following-no endoscopic procedures planned at this point  Chest pain Likely atypical Troponins minimally elevated-echo with stable EF Supportive care for now  Minimally elevated troponins Trend is flat Likely demand ischemia in the setting of hypotension/critical illness. Echo with able EF  HTN BP stable without use of any antihypertensives.  History of chronic HFpEF Status stable  History of CVA Baseline left  hemiparesis Antiplatelets/statin held-in the setting of elevated liver enzymes and thrombocytopenia.  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 (Active)     Wound 04/14/24 0100 Pressure Injury Buttocks Left;Right Unstageable - Full thickness tissue loss in which the base of the injury is covered by slough (yellow, tan, gray, green or brown) and/or eschar (tan, brown or black) in the wound bed. (Active)     Wound 04/14/24 0100 Pressure Injury Sacrum Medial Stage 2 -  Partial thickness loss of dermis presenting as a shallow open injury with a red, pink wound bed without slough. (Active)    Code status:   Code Status: Full Code   DVT Prophylaxis: SCDs Start: 04/13/24 2344 Avoid pharmacological prophylaxis-given severity of thrombocytopenia   Family Communication: Daughter-Tiana-[phone] left VM 12/10,12/12   Disposition Plan: Status is: Inpatient Remains inpatient appropriate because: Severity of illness   Planned Discharge Destination:Skilled nursing facility   Diet: Diet Order             DIET DYS 2 Fluid consistency: Thin  Diet effective now                     Antimicrobial agents: Anti-infectives (From admission, onward)    Start     Dose/Rate Route Frequency Ordered Stop   04/16/24 1415  DAPTOmycin  (CUBICIN ) IVPB 700 mg/100mL premix        700 mg 200 mL/hr over 30 Minutes Intravenous Daily 04/16/24 1324  04/16/24 1415  cefTRIAXone  (ROCEPHIN ) 2 g in sodium chloride  0.9 % 100 mL IVPB         2 g 200 mL/hr over 30 Minutes Intravenous Daily 04/16/24 1324     04/14/24 0600  piperacillin -tazobactam (ZOSYN ) IVPB 3.375 g  Status:  Discontinued        3.375 g 12.5 mL/hr over 240 Minutes Intravenous Every 8 hours 04/14/24 0132 04/15/24 0920   04/13/24 2045  ceFEPIme  (MAXIPIME ) 2 g in sodium chloride  0.9 % 100 mL IVPB        2 g 200 mL/hr over 30 Minutes Intravenous  Once 04/13/24 2030 04/13/24 2147   04/13/24 2045  metroNIDAZOLE  (FLAGYL ) IVPB 500 mg        500 mg 100 mL/hr over 60 Minutes Intravenous  Once 04/13/24 2030 04/13/24 2310   04/13/24 2045  vancomycin  (VANCOCIN ) IVPB 1000 mg/200 mL premix  Status:  Discontinued        1,000 mg 200 mL/hr over 60 Minutes Intravenous  Once 04/13/24 2030 04/13/24 2032   04/13/24 2045  vancomycin  (VANCOREADY) IVPB 1750 mg/350 mL        1,750 mg 175 mL/hr over 120 Minutes Intravenous  Once 04/13/24 2032 04/13/24 2349        MEDICATIONS: Scheduled Meds:  Gerhardt's butt cream   Topical BID   lactulose   20 g Oral TID   multivitamin with minerals  1 tablet Oral Daily   mouth rinse  15 mL Mouth Rinse 4 times per day   pantoprazole   40 mg Oral BID   thiamine  100 mg Oral Daily   Continuous Infusions:  cefTRIAXone  (ROCEPHIN )  IV 2 g (04/19/24 1355)   DAPTOmycin  700 mg (04/19/24 1319)   dextrose  10 mL/hr at 04/18/24 1130   Immune Globulin 10%     PRN Meds:.docusate, ipratropium-albuterol , morphine  injection, ondansetron  (ZOFRAN ) IV, mouth rinse, polyethylene glycol   I have personally reviewed following labs and imaging studies  LABORATORY DATA: CBC: Recent Labs  Lab 04/13/24 2123 04/13/24 2132 04/16/24 0345 04/17/24 0447 04/17/24 1017 04/17/24 1551 04/18/24 0415 04/19/24 0244  WBC 6.8   < > 4.0 4.5  4.5  --  4.2 4.2 4.7  NEUTROABS 6.2  --   --  2.3  --   --   --   --   HGB 11.9*   < > 9.4* 9.0*  9.0*  --  9.3* 9.0* 8.3*  HCT 36.4   < > 27.1* 26.5*  26.4*  --  27.8* 26.2* 24.8*  MCV 90.5   < > 88.3 87.5  88.3  --   88.8 89.7 89.2  PLT 56*   < > 34* 17*  18* 17* 53* 36* 29*   < > = values in this interval not displayed.    Basic Metabolic Panel: Recent Labs  Lab 04/15/24 0248 04/16/24 0345 04/17/24 0447 04/18/24 0415 04/19/24 0244  NA 145 145 142 142 142  K 4.9 2.8* 2.9* 3.4* 3.9  CL 114* 112* 110 107 108  CO2 21* 17* 28 26 27   GLUCOSE 147* 131* 104* 84 85  BUN 38* 23 15 11 10   CREATININE 1.27* 0.80 0.56 0.35* 0.43*  CALCIUM  7.4* 8.0* 7.9* 8.0* 8.1*  MG 1.7 1.8 2.1 1.8 2.2  PHOS  --   --  <1.0* 1.9* 2.6    GFR: Estimated Creatinine Clearance: 60.7 mL/min (A) (by C-G formula based on SCr of 0.43 mg/dL (L)).  Liver Function Tests: Recent Labs  Lab 04/15/24 [id number]  04/16/24 0345 04/17/24 0447 04/18/24 0415 04/19/24 0244  AST 1,475* 548* 219* 194* 157*  ALT 1,157* 734* 481* 371* 313*  ALKPHOS 90 93 104 118 127*  BILITOT 2.0* 1.8* 1.2 1.3* 0.9  PROT 5.5* 6.0* 5.3* 5.7* 5.7*  ALBUMIN  1.8* 2.3* 1.9* 2.1* 2.2*   Recent Labs  Lab 04/13/24 2123  LIPASE 106*   Recent Labs  Lab 04/14/24 0005 04/14/24 0957  AMMONIA 50* 60*    Coagulation Profile: Recent Labs  Lab 04/16/24 0345 04/17/24 0447 04/17/24 1017 04/18/24 0415 04/19/24 0244  INR 1.5* 1.3* 1.3* 1.2 1.1    Cardiac Enzymes: Recent Labs  Lab 04/15/24 0955 04/17/24 0447  CKTOTAL 232 91    BNP (last 3 results) Recent Labs    01/11/24 2300  PROBNP 3,648.0*    Lipid Profile: No results for input(s): CHOL, HDL, LDLCALC, TRIG, CHOLHDL, LDLDIRECT in the last 72 hours.  Thyroid  Function Tests: No results for input(s): TSH, T4TOTAL, FREET4, T3FREE, THYROIDAB in the last 72 hours.   Anemia Panel: No results for input(s): VITAMINB12, FOLATE, FERRITIN, TIBC, IRON, RETICCTPCT in the last 72 hours.  Urine analysis:    Component Value Date/Time   COLORURINE AMBER (A) 04/13/2024 2041   APPEARANCEUR CLOUDY (A) 04/13/2024 2041   LABSPEC 1.025 04/13/2024 2041   PHURINE 5.0  04/13/2024 2041   GLUCOSEU NEGATIVE 04/13/2024 2041   HGBUR SMALL (A) 04/13/2024 2041   BILIRUBINUR SMALL (A) 04/13/2024 2041   KETONESUR NEGATIVE 04/13/2024 2041   PROTEINUR 100 (A) 04/13/2024 2041   UROBILINOGEN 1.0 11/15/2010 2015   NITRITE NEGATIVE 04/13/2024 2041   LEUKOCYTESUR NEGATIVE 04/13/2024 2041    Sepsis Labs: Lactic Acid, Venous    Component Value Date/Time   LATICACIDVEN 4.5 (HH) 04/15/2024 1538    MICROBIOLOGY: Recent Results (from the past 240 hours)  MRSA Next Gen by PCR, Nasal     Status: None   Collection Time: 04/13/24 12:51 AM   Specimen: Nasal Mucosa; Nasal Swab  Result Value Ref Range Status   MRSA by PCR Next Gen NOT DETECTED NOT DETECTED Final    Comment: (NOTE) The GeneXpert MRSA Assay (FDA approved for NASAL specimens only), is one component of a comprehensive MRSA colonization surveillance program. It is not intended to diagnose MRSA infection nor to guide or monitor treatment for MRSA infections. Test performance is not FDA approved in patients less than 52 years old. Performed at [facility], [address], [place], [place] [address]   Resp panel by RT-PCR (RSV, Flu A&B, Covid) Anterior Nasal Swab     Status: None   Collection Time: 04/13/24  8:30 PM   Specimen: Anterior Nasal Swab  Result Value Ref Range Status   SARS Coronavirus 2 by RT PCR NEGATIVE NEGATIVE Final   Influenza A by PCR NEGATIVE NEGATIVE Final   Influenza B by PCR NEGATIVE NEGATIVE Final    Comment: (NOTE) The Xpert Xpress SARS-CoV-2/FLU/RSV plus assay is intended as an aid in the diagnosis of influenza from Nasopharyngeal swab specimens and should not be used as a sole basis for treatment. Nasal washings and aspirates are unacceptable for Xpert Xpress SARS-CoV-2/FLU/RSV testing.  Fact Sheet for Patients: bloggercourse.com  Fact Sheet for Healthcare Providers: seriousbroker.it  This test is not yet approved or  cleared by the [place]  FDA and has been authorized for detection and/or diagnosis of SARS-CoV-2 by FDA under an Emergency Use Authorization (EUA). This EUA will remain in effect (meaning this test can be used) for the duration  of the COVID-19 declaration under Section 564(b)(1) of the Act, 21 U.S.C. section 360bbb-3(b)(1), unless the authorization is terminated or revoked.     Resp Syncytial Virus by PCR NEGATIVE NEGATIVE Final    Comment: (NOTE) Fact Sheet for Patients: bloggercourse.com  Fact Sheet for Healthcare Providers: seriousbroker.it  This test is not yet approved or cleared by the [place]  FDA and has been authorized for detection and/or diagnosis of SARS-CoV-2 by FDA under an Emergency Use Authorization (EUA). This EUA will remain in effect (meaning this test can be used) for the duration of the COVID-19 declaration under Section 564(b)(1) of the Act, 21 U.S.C. section 360bbb-3(b)(1), unless the authorization is terminated or revoked.  Performed at [facility], [address], [place], [place] [address]   Blood Culture (routine x 2)     Status: None   Collection Time: 04/13/24  8:30 PM   Specimen: BLOOD  Result Value Ref Range Status   Specimen Description BLOOD SITE NOT SPECIFIED  Final   Special Requests   Final    BOTTLES DRAWN AEROBIC AND ANAEROBIC Blood Culture adequate volume   Culture   Final    NO GROWTH 5 DAYS Performed at [facility], [address] [place], [place] [address]    Report Status 04/18/2024 FINAL  Final  Blood Culture (routine x 2)     Status: None   Collection Time: 04/13/24  8:35 PM   Specimen: BLOOD LEFT FOREARM  Result Value Ref Range Status   Specimen Description BLOOD LEFT FOREARM  Final   Special Requests   Final    BOTTLES DRAWN AEROBIC AND ANAEROBIC Blood Culture results may not be optimal due to an inadequate volume of blood received in culture bottles    Culture   Final    NO GROWTH 5 DAYS Performed at [facility], [address], [place], [place] [address]    Report Status 04/18/2024 FINAL  Final  Culture, blood (Routine X 2) w Reflex to ID Panel     Status: None (Preliminary result)   Collection Time: 04/16/24 11:42 AM   Specimen: BLOOD  Result Value Ref Range Status   Specimen Description BLOOD SITE NOT SPECIFIED  Final   Special Requests   Final    BOTTLES DRAWN AEROBIC AND ANAEROBIC Blood Culture adequate volume   Culture   Final    NO GROWTH 3 DAYS Performed at [facility], [address], [place], [place] [address]    Report Status PENDING  Incomplete  Culture, blood (Routine X 2) w Reflex to ID Panel     Status: None (Preliminary result)   Collection Time: 04/16/24  2:56 PM   Specimen: BLOOD RIGHT ARM  Result Value Ref Range Status   Specimen Description BLOOD RIGHT ARM  Final   Special Requests   Final    BOTTLES DRAWN AEROBIC AND ANAEROBIC Blood Culture adequate volume   Culture   Final    NO GROWTH 3 DAYS Performed at [facility], [address] [place], [place] [address]    Report Status PENDING  Incomplete    RADIOLOGY STUDIES/RESULTS: No results found.    LOS: 6 days   Donalda Applebaum, MD  [facility]    To contact the attending provider between 7A-7P or the covering provider during after hours 7P-7A, please log into the web site [email or web address] and access using universal [place] password for that web site. If you do not have  the password, please call the hospital operator.  04/19/2024, 2:27 PM

## 2024-04-19 NOTE — Progress Notes (Signed)
 [facility] Infectious Diseases Follow Up Note  Patient Identification: Patient Name: [name] MRN: [id number] Admit Date: 04/13/2024  8:19 PM Age: 78 y.o.Today's Date: 04/19/2024  Reason for Visit: abnormal TTE   Principal Problem:   Acute liver failure Active Problems:   Transaminitis   Aortic valve vegetation   Protein-calorie malnutrition, severe  Antibiotics:  Off antibiotics since 12/8 Vancomycin , cefepime , metronidazole  12/6 Zosyn  12/6-12/7 Daptomycin /ceftriaxone  12/9-   Lines/Hardware:  Interval Events: Afebrile Labs remarkable for creatinine 0.43, ALP 127, albumin  2.2, AST 157 ALT 313, hemoglobin 8.3, platelets down to 42  Assessment 78 year old female with prior history as below including CVA, HFpEF, COPD, HTN, DVT not on AC, AA who was initially brought to the ED on 12/6 after being found unresponsive by her roommate (no heat/light under a single blanket, she was apparently covered in feces/dried blood-found to be hypothermic, hypotensive and hypoglycemic). She was thought to have undifferentiated shock on arrival with acute liver failure, acute kidney injury and admitted to the ICU, required vasopressors, D10 infusion and broad-spectrum antibiotics with rapid improvement and then transferred to [facility] on 12/9. ID following for   # TTE with no vegetation versus thrombus in the AV/severe thickening of the aortic valve  # Encephalopathy -seems to have improved  # Shock liver/acute liver failure with colopathy/hyperbilirubinemia and transaminitis- - Evaluated by GI and most likely cause 2/2 hypotension with other possibilities considered include DILI in the setting of Tylenol  use, less likely CMV, ANA+.   Doppler negative for thrombosis.  Plan to complete course of NAC, vitamin K  - Liver enzymes improving  # Severe thrombocytopenia  - s/p 1 unit of pheresis on 12/10 - no schistocytes and not thought to have TTP or ITP  per discussion of primary with hematology  - Acute hepatitis panel negative - Hematology consulted and plan for IVIG  # Bilateral buttock pressure injury- no signs of infection   Recommendations - Continue daptomycin  and ceftriaxone  - Follow-up blood cultures - Monitor CBC and CMOP - Recall us  back once TEE done/any concerns. Will sign off for now.   Rest of the management as per the primary team. Thank you for the consult. Please page with pertinent questions or concerns.  ______________________________________________________________________ Subjective patient seen and examined at the bedside.  No complaints  Vitals BP (!) 141/61 (BP Location: Right Arm)   Pulse (!) 54   Temp 98.5 F (36.9 C) (Oral)   Resp 19   Ht 5' 10 (1.778 m)   Wt 78.3 kg   SpO2 99%   BMI 24.77 kg/m    Physical Exam Constitutional: Chronically ill-appearing female, nontoxic appearing    Comments: HEENT WNL  Cardiovascular:     Rate and Rhythm: Normal rate     Heart sounds:  Pulmonary:     Effort: Pulmonary effort is normal.     Comments:  Abdominal:     Palpations:     Tenderness: Nondistended   Musculoskeletal:        General: No swelling or tenderness in peripheral joints  Skin:    Comments: Bilateral sacral pressure injury, no signs of infection   Neurological:     General: Awake, alert, follows basic commands,   Psychiatric:        Mood and Affect: Mood normal.   Pertinent Microbiology Results for orders placed or performed during the hospital encounter of 04/13/24  MRSA Next Gen by PCR, Nasal     Status: None   Collection Time: 04/13/24 12:51 AM  Specimen: Nasal Mucosa; Nasal Swab  Result Value Ref Range Status   MRSA by PCR Next Gen NOT DETECTED NOT DETECTED Final    Comment: (NOTE) The GeneXpert MRSA Assay (FDA approved for NASAL specimens only), is one component of a comprehensive MRSA colonization surveillance program. It is not intended to diagnose MRSA infection  nor to guide or monitor treatment for MRSA infections. Test performance is not FDA approved in patients less than 44 years old. Performed at [facility], [address], [place], [place] [address]   Resp panel by RT-PCR (RSV, Flu A&B, Covid) Anterior Nasal Swab     Status: None   Collection Time: 04/13/24  8:30 PM   Specimen: Anterior Nasal Swab  Result Value Ref Range Status   SARS Coronavirus 2 by RT PCR NEGATIVE NEGATIVE Final   Influenza A by PCR NEGATIVE NEGATIVE Final   Influenza B by PCR NEGATIVE NEGATIVE Final    Comment: (NOTE) The Xpert Xpress SARS-CoV-2/FLU/RSV plus assay is intended as an aid in the diagnosis of influenza from Nasopharyngeal swab specimens and should not be used as a sole basis for treatment. Nasal washings and aspirates are unacceptable for Xpert Xpress SARS-CoV-2/FLU/RSV testing.  Fact Sheet for Patients: bloggercourse.com  Fact Sheet for Healthcare Providers: seriousbroker.it  This test is not yet approved or cleared by the [place]  FDA and has been authorized for detection and/or diagnosis of SARS-CoV-2 by FDA under an Emergency Use Authorization (EUA). This EUA will remain in effect (meaning this test can be used) for the duration of the COVID-19 declaration under Section 564(b)(1) of the Act, 21 U.S.C. section 360bbb-3(b)(1), unless the authorization is terminated or revoked.     Resp Syncytial Virus by PCR NEGATIVE NEGATIVE Final    Comment: (NOTE) Fact Sheet for Patients: bloggercourse.com  Fact Sheet for Healthcare Providers: seriousbroker.it  This test is not yet approved or cleared by the [place]  FDA and has been authorized for detection and/or diagnosis of SARS-CoV-2 by FDA under an Emergency Use Authorization (EUA). This EUA will remain in effect (meaning this test can be used) for the duration of the COVID-19  declaration under Section 564(b)(1) of the Act, 21 U.S.C. section 360bbb-3(b)(1), unless the authorization is terminated or revoked.  Performed at [facility], [address], [place], [place] [address]   Blood Culture (routine x 2)     Status: None   Collection Time: 04/13/24  8:30 PM   Specimen: BLOOD  Result Value Ref Range Status   Specimen Description BLOOD SITE NOT SPECIFIED  Final   Special Requests   Final    BOTTLES DRAWN AEROBIC AND ANAEROBIC Blood Culture adequate volume   Culture   Final    NO GROWTH 5 DAYS Performed at [facility], [address] [place], [place] [address]    Report Status 04/18/2024 FINAL  Final  Blood Culture (routine x 2)     Status: None   Collection Time: 04/13/24  8:35 PM   Specimen: BLOOD LEFT FOREARM  Result Value Ref Range Status   Specimen Description BLOOD LEFT FOREARM  Final   Special Requests   Final    BOTTLES DRAWN AEROBIC AND ANAEROBIC Blood Culture results may not be optimal due to an inadequate volume of blood received in culture bottles   Culture   Final    NO GROWTH 5 DAYS Performed at [facility], [address] [place], [place] [address]    Report Status  04/18/2024 FINAL  Final  Culture, blood (Routine X 2) w Reflex to ID Panel     Status: None (Preliminary result)   Collection Time: 04/16/24 11:42 AM   Specimen: BLOOD  Result Value Ref Range Status   Specimen Description BLOOD SITE NOT SPECIFIED  Final   Special Requests   Final    BOTTLES DRAWN AEROBIC AND ANAEROBIC Blood Culture adequate volume   Culture   Final    NO GROWTH 3 DAYS Performed at [facility], [address], [place], [place] [address]    Report Status PENDING  Incomplete  Culture, blood (Routine X 2) w Reflex to ID Panel     Status: None (Preliminary result)   Collection Time: 04/16/24  2:56 PM   Specimen: BLOOD RIGHT ARM  Result Value Ref Range Status   Specimen Description BLOOD RIGHT ARM  Final   Special Requests    Final    BOTTLES DRAWN AEROBIC AND ANAEROBIC Blood Culture adequate volume   Culture   Final    NO GROWTH 3 DAYS Performed at [facility], [address], [place], [place] [address]    Report Status PENDING  Incomplete   Pertinent Lab.    Latest Ref Rng & Units 04/19/2024    2:44 AM 04/18/2024    4:15 AM 04/17/2024    3:51 PM  CBC  WBC 4.0 - 10.5 K/uL 4.7  4.2  4.2   Hemoglobin 12.0 - 15.0 g/dL 8.3  9.0  9.3   Hematocrit 36.0 - 46.0 % 24.8  26.2  27.8   Platelets 150 - 400 K/uL 29  36  53       Latest Ref Rng & Units 04/19/2024    2:44 AM 04/18/2024    4:15 AM 04/17/2024    4:47 AM  CMP  Glucose 70 - 99 mg/dL 85  84  895   BUN 8 - 23 mg/dL 10  11  15    Creatinine 0.44 - 1.00 mg/dL 9.56  9.64  9.43   Sodium 135 - 145 mmol/L 142  142  142   Potassium 3.5 - 5.1 mmol/L 3.9  3.4  2.9   Chloride 98 - 111 mmol/L 108  107  110   CO2 22 - 32 mmol/L 27  26  28    Calcium  8.9 - 10.3 mg/dL 8.1  8.0  7.9   Total Protein 6.5 - 8.1 g/dL 5.7  5.7  5.3   Total Bilirubin 0.0 - 1.2 mg/dL 0.9  1.3  1.2   Alkaline Phos 38 - 126 U/L 127  118  104   AST 15 - 41 U/L 157  194  219   ALT 0 - 44 U/L 313  371  481      Pertinent Imaging today Plain films and CT images have been personally visualized and interpreted; radiology reports have been reviewed. Decision making incorporated into the Impression /   I spent 35 minutes involved in face-to-face and non-face-to-face activities for this patient on the day of the visit. Professional time spent includes the following activities: Preparing to see the patient (review of tests), Obtaining and reviewing separately obtained history  (hospitalist progress note, hematology note), Performing a medically appropriate examination and evaluation. referring and communicating with other health care professionals, Documenting clinical information in the EMR, Independently interpreting results (not separately reported), and Care coordination (not separately  reported).   Of note, portions of this note may have been created with voice  recognition software. While this note has been edited for accuracy, occasional wrong-word or sound-a-like substitutions may have occurred due to the inherent limitations of voice recognition software.   Electronically signed by:   Annalee Orem, MD Infectious Disease Physician [facility] Pager: [phone]

## 2024-04-20 DIAGNOSIS — E876 Hypokalemia: Secondary | ICD-10-CM | POA: Diagnosis not present

## 2024-04-20 DIAGNOSIS — N3000 Acute cystitis without hematuria: Secondary | ICD-10-CM | POA: Diagnosis not present

## 2024-04-20 DIAGNOSIS — R410 Disorientation, unspecified: Secondary | ICD-10-CM | POA: Diagnosis not present

## 2024-04-20 DIAGNOSIS — N179 Acute kidney failure, unspecified: Secondary | ICD-10-CM | POA: Diagnosis not present

## 2024-04-20 LAB — COMPREHENSIVE METABOLIC PANEL WITH GFR
ALT: 199 U/L — ABNORMAL HIGH (ref 0–44)
AST: 91 U/L — ABNORMAL HIGH (ref 15–41)
Albumin: 1.9 g/dL — ABNORMAL LOW (ref 3.5–5.0)
Alkaline Phosphatase: 115 U/L (ref 38–126)
Anion gap: 6 (ref 5–15)
BUN: 7 mg/dL — ABNORMAL LOW (ref 8–23)
CO2: 27 mmol/L (ref 22–32)
Calcium: 7.9 mg/dL — ABNORMAL LOW (ref 8.9–10.3)
Chloride: 102 mmol/L (ref 98–111)
Creatinine, Ser: 0.51 mg/dL (ref 0.44–1.00)
GFR, Estimated: >60 mL/min (ref >60)
Glucose, Bld: 76 mg/dL (ref 70–99)
Potassium: 4 mmol/L (ref 3.5–5.1)
Sodium: 135 mmol/L (ref 135–145)
Total Bilirubin: 1.1 mg/dL (ref 0.0–1.2)
Total Protein: 6.8 g/dL (ref 6.5–8.1)

## 2024-04-20 LAB — CBC
HCT: 22.9 % — ABNORMAL LOW (ref 36.0–46.0)
HCT: 25.9 % — ABNORMAL LOW (ref 36.0–46.0)
Hemoglobin: 7.7 g/dL — ABNORMAL LOW (ref 12.0–15.0)
Hemoglobin: 8.5 g/dL — ABNORMAL LOW (ref 12.0–15.0)
MCH: 30.7 pg (ref 26.0–34.0)
MCH: 30.4 pg (ref 26.0–34.0)
MCHC: 33.6 g/dL (ref 30.0–36.0)
MCHC: 32.8 g/dL (ref 30.0–36.0)
MCV: 91.2 fL (ref 80.0–100.0)
MCV: 92.5 fL (ref 80.0–100.0)
Platelets: 9 K/uL — CL (ref 150–400)
Platelets: 32 K/uL — ABNORMAL LOW (ref 150–400)
RBC: 2.51 MIL/uL — ABNORMAL LOW (ref 3.87–5.11)
RBC: 2.8 MIL/uL — ABNORMAL LOW (ref 3.87–5.11)
RDW: 15.9 % — ABNORMAL HIGH (ref 11.5–15.5)
RDW: 16 % — ABNORMAL HIGH (ref 11.5–15.5)
WBC: 3.8 K/uL — ABNORMAL LOW (ref 4.0–10.5)
WBC: 4.4 K/uL (ref 4.0–10.5)
nRBC: 0.8 % — ABNORMAL HIGH (ref 0.0–0.2)
nRBC: 0.5 % — ABNORMAL HIGH (ref 0.0–0.2)

## 2024-04-20 LAB — GLUCOSE, CAPILLARY
Glucose-Capillary: 67 mg/dL — ABNORMAL LOW (ref 70–99)
Glucose-Capillary: 76 mg/dL (ref 70–99)
Glucose-Capillary: 78 mg/dL (ref 70–99)
Glucose-Capillary: 78 mg/dL (ref 70–99)
Glucose-Capillary: 91 mg/dL (ref 70–99)
Glucose-Capillary: 81 mg/dL (ref 70–99)
Glucose-Capillary: 119 mg/dL — ABNORMAL HIGH (ref 70–99)

## 2024-04-20 MED ORDER — SODIUM CHLORIDE 0.9% IV SOLUTION: Freq: Once | INTRAVENOUS | Status: AC

## 2024-04-20 NOTE — TOC Progression Note (Signed)
 Transition of Care [facility]) - Progression Note    Patient Details  Name: [name] MRN: [id number] Date of Birth: July 10, 1945  Transition of Care [facility]) CM/SW Contact  Bridget Cordella Simmonds, LCSW Phone Number: 04/20/2024, 2:24 PM  Clinical Narrative:  message from Rebekah/[facility]: will not have bed available until Monday.     Expected Discharge Plan: Skilled Nursing Facility Barriers to Discharge: Continued Medical Work up               Expected Discharge Plan and Services                                               Social Drivers of Health (SDOH) Interventions SDOH Screenings   Food Insecurity: Food Insecurity Present (04/15/2024)  Housing: High Risk (04/15/2024)  Transportation Needs: Unmet Transportation Needs (04/15/2024)  Utilities: At Risk (04/15/2024)  Depression (PHQ2-9): Low Risk (06/04/2021)  Social Connections: Unknown (04/15/2024)  Tobacco Use: Medium Risk (03/22/2024)    Readmission Risk Interventions    01/15/2024    2:24 PM  Readmission Risk Prevention Plan  Transportation Screening Complete  HRI or Home Care Consult Complete  Social Work Consult for Recovery Care Planning/Counseling Complete  Palliative Care Screening Not Applicable  Medication Review Oceanographer) Complete

## 2024-04-20 NOTE — Progress Notes (Signed)
 Hypoglycemic Event  CBG: 67 mg/dl @0327   Treatment: 4 oz juice/soda  Symptoms: None  Follow-up CBG: Time:0415 CBG Result:74mg /dl  Possible Reasons for Event: Inadequate meal intake  Comments/MD notified:MD Alfornia Rosalynn Blotter

## 2024-04-20 NOTE — Progress Notes (Signed)
 PROGRESS NOTE        PATIENT DETAILS Name: [name] Age: 78 y.o. Sex: female Date of Birth: [birth year] Admit Date: 04/13/2024 Admitting Physician Tamela Stakes, MD ERE:Bzmryzrx, Rojelio PARAS, NP  Brief Summary: Patient is a 78 y.o.  female with history of CVA, COPD, HTN-who was brought to the ED after she was found by her roommate unresponsive (no heat/light-under a single blanket)-she was apparently covered in feces/dried blood-found to be hypothermic/hypotensive/hypoglycemic-she was thought to have undifferentiated shock-acute liver failure-acute kidney injury-admitted to the ICU-stabilized with pressors/D10 infusion/broad-spectrum antibiotics-rapidly improved and subsequently transferred to [facility].  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 12/8>> ANA: Positive 12/8>> anti-smooth muscle antibody: Negative  Significant microbiology data: 12/6>> COVID/influenza/RSV PCR: Negative 12/6>> blood culture: No growth 12/6>> acute hepatitis  serology: Negative 12/6>> CMV DNA PCR: Negative 12/9>> blood culture: No growth  Procedures: None  Consults: PCCM GI ID Hematology [name])  Subjective: No major issues overnight-lying comfortably in bed.  Appears stable overnight.  Objective: Vitals: Blood pressure (!) 130/59, pulse 65, temperature 98.3 F (36.8 C), temperature source Oral, resp. rate 14, height 5' 10 (1.778 m), weight 82.4 kg, SpO2 97%.   Exam: Awake-speaking in few word sentences today Chronically sick appearing Chronic left-sided hemiparesis with superimposed generalized weakness Abdomen: Soft nontender nondistended  Pertinent Labs/Radiology:    Latest Ref Rng & Units 04/20/2024    8:08 AM 04/19/2024    2:44 AM 04/18/2024    4:15 AM  CBC  WBC 4.0 - 10.5 K/uL 3.8  4.7  4.2   Hemoglobin 12.0 - 15.0 g/dL 7.7  8.3  9.0   Hematocrit 36.0 - 46.0 % 22.9  24.8  26.2   Platelets 150 - 400 K/uL 9  29  36     Lab Results  Component Value Date   NA 135 04/20/2024   K 4.0 04/20/2024   CL 102 04/20/2024   CO2 27 04/20/2024      Assessment/Plan: Undifferentiated shock Unclear etiology-initially thought to have septic shock but all cultures negative to date-has rapidly improved-initially on admission to the ICU was started on vancomycin /Zosyn  but all antibiotics were discontinued on 12/7.  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  Plans were to obtain TEE but per cardiology-unable to safely perform TEE unless platelet count persistently above 50K.  Will need to notify cardiology once platelet counts are persistently above 50K.  Acute metabolic encephalopathy Secondary to hypothermia/hypoglycemia/undifferentiated shock/acute liver failure Head CT without  acute abnormalities Seems to have clinically improved after  treatment of underlying etiologies.  Hypothermia Likely secondary to environmental exposure (per prior notes- cool room with single blanket-no heating or light) TSH stable Now normothermic after supportive care  Hypoglycemia Likely secondary to acute liver injury CBGs continue to be borderline-watch closely-supportive care for now-encourage oral intake  Recent Labs    04/20/24 0554 04/20/24 0738 04/20/24 1205  GLUCAP 78 78 91     Acute liver failure Unclear etiology-possibly shock liver from hypotension from possible sepsis physiology LFTs/coags improving Dopplers without portal/hepatic vein thrombosis Acute hepatitis serology negative CMV IgM positive but viral load negative  Negative workup for autoimmune hepatitis GI following-completed N-acetylcysteine  12/10 Empirically on lactulose   Thrombocytopenia Likely secondary to acute liver injury/failure-possible sepsis physiology-however no improvement in spite of improvement in her sepsis physiology and liver injury.  No evidence of TTP on peripheral smear-unclear if this is ITP. Given 1 unit of platelets on 12/10 as her platelet count was<20K, platelet counts temporarily improved but on 12/12-downtrending again-started on IVIG-x 2 doses planned.   unfortunately-platelet counts continue to downtrend-down to 9K today-no obvious bleeding-p 1 unit of platelets ordered.  Discussed with hematology on-call-Dr. Gudina-continue to treat as outlined above-if no improvement-Nplate  injection planned on 12/15.  Normocytic anemia Likely secondary to critical illness Continue to trend/follow CBC  Oropharyngeal dysphagia Secondary to critical illness/encephalopathy SLP following-on dysphagia 2 diet.  AKI Hemodynamically mediated due to hypotension Resolved.  Hypokalemia/Hypophosphatemia Repleted  Metabolic acidosis Likely due to delayed clearance of lactic acid-in the setting of liver failure. Improved with supportive  care  Self-limited episode-small-volume coffee-ground emesis Hb stable PPI GI following-no endoscopic procedures planned at this point  Chest pain Likely atypical Troponins minimally elevated-echo with stable EF Supportive care for now  Minimally elevated troponins Trend is flat Likely demand ischemia in the setting of hypotension/critical illness. Echo with able EF  HTN BP stable without use of any antihypertensives.  History of chronic HFpEF Status stable  History of CVA Baseline left hemiparesis Antiplatelets/statin held-in the setting of elevated liver enzymes and thrombocytopenia.  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 (Active)     Wound 04/14/24 0100 Pressure Injury Buttocks Left;Right Unstageable - Full thickness tissue loss in which the base of the injury is covered by slough (yellow, tan, gray, green or brown) and/or eschar (tan, brown or black) in the wound bed. (Active)     Wound 04/14/24 0100 Pressure Injury Sacrum Medial Stage 2 -  Partial thickness loss of dermis presenting as a shallow open injury with a red, pink wound bed without slough. (Active)    Code status:   Code Status: Full Code   DVT Prophylaxis: SCDs Start: 04/13/24 2344 Avoid pharmacological prophylaxis-given severity of thrombocytopenia   Family Communication: Daughter-[phone] left VM 12/10,12/12.12/13   Disposition Plan: Status is: Inpatient Remains inpatient appropriate because: Severity of illness    Planned Discharge Destination:Skilled nursing facility   Diet: Diet Order             DIET DYS 2 Fluid consistency: Thin  Diet effective now  Antimicrobial agents: Anti-infectives (From admission, onward)    Start     Dose/Rate Route Frequency Ordered Stop   04/16/24 1415  DAPTOmycin  (CUBICIN ) IVPB 700 mg/132mL premix        700 mg 200 mL/hr over 30 Minutes Intravenous Daily 04/16/24 1324     04/16/24 1415  cefTRIAXone  (ROCEPHIN ) 2 g in sodium chloride  0.9 % 100 mL IVPB        2 g 200 mL/hr over 30 Minutes Intravenous Daily 04/16/24 1324     04/14/24 0600  piperacillin -tazobactam (ZOSYN ) IVPB 3.375 g  Status:  Discontinued        3.375 g 12.5 mL/hr over 240 Minutes Intravenous Every 8 hours 04/14/24 0132 04/15/24 0920   04/13/24 2045  ceFEPIme  (MAXIPIME ) 2 g in sodium chloride  0.9 % 100 mL IVPB        2 g 200 mL/hr over 30 Minutes Intravenous  Once 04/13/24 2030 04/13/24 2147   04/13/24 2045  metroNIDAZOLE  (FLAGYL ) IVPB 500 mg        500 mg 100 mL/hr over 60 Minutes Intravenous  Once 04/13/24 2030 04/13/24 2310   04/13/24 2045  vancomycin  (VANCOCIN ) IVPB 1000 mg/200 mL premix  Status:  Discontinued        1,000 mg 200 mL/hr over 60 Minutes Intravenous  Once 04/13/24 2030 04/13/24 2032   04/13/24 2045  vancomycin  (VANCOREADY) IVPB 1750 mg/350 mL        1,750 mg 175 mL/hr over 120 Minutes Intravenous  Once 04/13/24 2032 04/13/24 2349        MEDICATIONS: Scheduled Meds:  Gerhardt's butt cream   Topical BID   lactulose   20 g Oral TID   multivitamin with minerals  1 tablet Oral Daily   mouth rinse  15 mL Mouth Rinse 4 times per day   pantoprazole   40 mg Oral BID   thiamine   100 mg Oral Daily   Continuous Infusions:  cefTRIAXone  (ROCEPHIN )  IV 2 g (04/19/24 1355)   DAPTOmycin  700 mg (04/19/24 1319)   Immune Globulin  10% 164 mL/hr at 04/19/24 1750   PRN Meds:.docusate, ipratropium-albuterol , morphine  injection, ondansetron  (ZOFRAN ) IV, mouth  rinse, polyethylene glycol   I have personally reviewed following labs and imaging studies  LABORATORY DATA: CBC: Recent Labs  Lab 04/13/24 2123 04/13/24 2132 04/17/24 0447 04/17/24 1017 04/17/24 1551 04/18/24 0415 04/19/24 0244 04/20/24 0808  WBC 6.8   < > 4.5  4.5  --  4.2 4.2 4.7 3.8*  NEUTROABS 6.2  --  2.3  --   --   --   --   --   HGB 11.9*   < > 9.0*  9.0*  --  9.3* 9.0* 8.3* 7.7*  HCT 36.4   < > 26.5*  26.4*  --  27.8* 26.2* 24.8* 22.9*  MCV 90.5   < > 87.5  88.3  --  88.8 89.7 89.2 91.2  PLT 56*   < > 17*  18* 17* 53* 36* 29* 9*   < > = values in this interval not displayed.    Basic Metabolic Panel: Recent Labs  Lab 04/15/24 0248 04/16/24 0345 04/17/24 0447 04/18/24 0415 04/19/24 0244 04/20/24 0808  NA 145 145 142 142 142 135  K 4.9 2.8* 2.9* 3.4* 3.9 4.0  CL 114* 112* 110 107 108 102  CO2 21* 17* 28 26 27 27   GLUCOSE 147* 131* 104* 84 85 76  BUN 38* 23 15 11 10  7*  CREATININE 1.27* 0.80 0.56 0.35* 0.43* 0.51  CALCIUM  7.4* 8.0* 7.9* 8.0* 8.1* 7.9*  MG 1.7 1.8 2.1 1.8 2.2  --   PHOS  --   --  <1.0* 1.9* 2.6  --     GFR: Estimated Creatinine Clearance: 65.6 mL/min (by C-G formula based on SCr of 0.51 mg/dL).  Liver Function Tests: Recent Labs  Lab 04/16/24 0345 04/17/24 0447 04/18/24 0415 04/19/24 0244 04/20/24 0808  AST 548* 219* 194* 157* 91*  ALT 734* 481* 371* 313* 199*  ALKPHOS 93 104 118 127* 115  BILITOT 1.8* 1.2 1.3* 0.9 1.1  PROT 6.0* 5.3* 5.7* 5.7* 6.8  ALBUMIN  2.3* 1.9* 2.1* 2.2* 1.9*   Recent Labs  Lab 04/13/24 2123  LIPASE 106*   Recent Labs  Lab 04/14/24 0005 04/14/24 0957  AMMONIA 50* 60*    Coagulation Profile: Recent Labs  Lab 04/16/24 0345 04/17/24 0447 04/17/24 1017 04/18/24 0415 04/19/24 0244  INR 1.5* 1.3* 1.3* 1.2 1.1    Cardiac Enzymes: Recent Labs  Lab 04/15/24 0955 04/17/24 0447  CKTOTAL 232 91    BNP (last 3 results) Recent Labs    01/11/24 2300  PROBNP 3,648.0*    Lipid  Profile: No results for input(s): CHOL, HDL, LDLCALC, TRIG, CHOLHDL, LDLDIRECT in the last 72 hours.  Thyroid  Function Tests: No results for input(s): TSH, T4TOTAL, FREET4, T3FREE, THYROIDAB in the last 72 hours.   Anemia Panel: No results for input(s): VITAMINB12, FOLATE, FERRITIN, TIBC, IRON, RETICCTPCT in the last 72 hours.  Urine analysis:    Component Value Date/Time   COLORURINE AMBER (A) 04/13/2024 2041   APPEARANCEUR CLOUDY (A) 04/13/2024 2041   LABSPEC 1.025 04/13/2024 2041   PHURINE 5.0 04/13/2024 2041   GLUCOSEU NEGATIVE 04/13/2024 2041   HGBUR SMALL (A) 04/13/2024 2041   BILIRUBINUR SMALL (A) 04/13/2024 2041   KETONESUR NEGATIVE 04/13/2024 2041   PROTEINUR 100 (A) 04/13/2024 2041   UROBILINOGEN 1.0 11/15/2010 2015   NITRITE NEGATIVE 04/13/2024 2041   LEUKOCYTESUR NEGATIVE 04/13/2024 2041    Sepsis Labs: Lactic Acid, Venous    Component Value Date/Time   LATICACIDVEN 4.5 (HH) 04/15/2024 1538    MICROBIOLOGY: Recent Results (from the past 240 hours)  MRSA Next Gen by PCR, Nasal     Status: None   Collection Time: 04/13/24 12:51 AM   Specimen: Nasal Mucosa; Nasal Swab  Result Value Ref Range Status   MRSA by PCR Next Gen NOT DETECTED NOT DETECTED Final    Comment: (NOTE) The GeneXpert MRSA Assay (FDA approved for NASAL specimens only), is one component of a comprehensive MRSA colonization surveillance program. It is not intended to diagnose MRSA infection nor to guide or monitor treatment for MRSA infections. Test performance is not FDA approved in patients less than 9 years old. Performed at [facility], [address], [place], [place] [address]   Resp panel by RT-PCR (RSV, Flu A&B, Covid) Anterior Nasal Swab     Status: None   Collection Time: 04/13/24  8:30 PM   Specimen: Anterior Nasal Swab  Result Value Ref Range Status   SARS Coronavirus 2 by RT PCR NEGATIVE NEGATIVE Final   Influenza A by PCR NEGATIVE  NEGATIVE Final   Influenza B by PCR NEGATIVE NEGATIVE Final    Comment: (NOTE) The Xpert Xpress SARS-CoV-2/FLU/RSV plus assay is intended as an aid in the diagnosis of influenza from Nasopharyngeal swab specimens and should not be used as a sole basis for treatment. Nasal washings and aspirates are unacceptable for Xpert Xpress SARS-CoV-2/FLU/RSV testing.  Fact Sheet for Patients: bloggercourse.com  Fact Sheet for Healthcare Providers: seriousbroker.it  This test is not yet approved or cleared by the [place]  FDA and has been authorized for detection and/or diagnosis of SARS-CoV-2 by FDA under an Emergency Use Authorization (EUA). This EUA will remain in effect (meaning this test can be used) for the duration of the COVID-19 declaration under Section 564(b)(1) of the Act, 21 U.S.C. section 360bbb-3(b)(1), unless the authorization is terminated or revoked.     Resp Syncytial Virus by PCR NEGATIVE NEGATIVE Final    Comment: (NOTE) Fact Sheet for Patients: bloggercourse.com  Fact Sheet for Healthcare Providers: seriousbroker.it  This test is not yet approved or cleared by the [place]  FDA and has been authorized for detection and/or diagnosis of SARS-CoV-2 by FDA under an Emergency Use Authorization (EUA). This EUA will remain in effect (meaning this test can be used) for the duration of the COVID-19 declaration under Section 564(b)(1) of the Act, 21 U.S.C. section 360bbb-3(b)(1), unless the authorization is terminated or revoked.  Performed at [facility], [address], [place], [place] [address]   Blood Culture (routine x 2)     Status: None   Collection Time: 04/13/24  8:30 PM   Specimen: BLOOD  Result Value Ref Range Status   Specimen Description BLOOD SITE NOT SPECIFIED  Final   Special Requests   Final    BOTTLES DRAWN AEROBIC AND ANAEROBIC Blood  Culture adequate volume   Culture   Final    NO GROWTH 5 DAYS Performed at [facility], [address], [place], [place] [address]    Report Status 04/18/2024 FINAL  Final  Blood Culture (routine x 2)     Status: None   Collection Time: 04/13/24  8:35 PM   Specimen: BLOOD LEFT FOREARM  Result Value Ref Range Status   Specimen Description BLOOD LEFT FOREARM  Final   Special Requests   Final    BOTTLES DRAWN AEROBIC AND ANAEROBIC Blood Culture results may not be optimal due to an inadequate volume of blood received in culture bottles   Culture   Final    NO GROWTH 5 DAYS Performed at [facility], [address], [place], [place] [address]    Report Status 04/18/2024 FINAL  Final  Culture, blood (Routine X 2) w Reflex to ID Panel     Status: None (Preliminary result)   Collection Time: 04/16/24 11:42 AM   Specimen: BLOOD  Result Value Ref Range Status   Specimen Description BLOOD SITE NOT SPECIFIED  Final   Special Requests   Final    BOTTLES DRAWN AEROBIC AND ANAEROBIC Blood Culture adequate volume   Culture   Final    NO GROWTH 4 DAYS Performed at [facility], [address], [place], [place] [address]    Report Status PENDING  Incomplete  Culture, blood (Routine X 2) w Reflex to ID Panel     Status: None (Preliminary result)   Collection Time: 04/16/24  2:56 PM   Specimen: BLOOD RIGHT ARM  Result Value Ref Range Status   Specimen Description BLOOD RIGHT ARM  Final   Special Requests   Final    BOTTLES DRAWN AEROBIC AND ANAEROBIC Blood Culture adequate volume   Culture   Final    NO GROWTH 4 DAYS Performed at [facility], [address] [place], [place] [address]    Report Status PENDING  Incomplete    RADIOLOGY  STUDIES/RESULTS: No results found.    LOS: 7 days   Donalda Applebaum, MD  [facility]    To contact the attending provider between 7A-7P or the covering provider during after hours 7P-7A, please log into the web site  [email or web address] and access using universal [place] password for that web site. If you do not have the password, please call the hospital operator.  04/20/2024, 12:37 PM

## 2024-04-20 NOTE — Plan of Care (Signed)
°  Problem: Health Behavior/Discharge Planning: Goal: Ability to manage health-related needs will improve Outcome: Progressing   Problem: Clinical Measurements: Goal: Ability to maintain clinical measurements within normal limits will improve Outcome: Progressing Goal: Will remain free from infection Outcome: Progressing   Problem: Activity: Goal: Risk for activity intolerance will decrease Outcome: Progressing   Problem: Nutrition: Goal: Adequate nutrition will be maintained Outcome: Progressing   Problem: Elimination: Goal: Will not experience complications related to bowel motility Outcome: Progressing   Problem: Pain Managment: Goal: General experience of comfort will improve and/or be controlled Outcome: Progressing

## 2024-04-20 NOTE — Plan of Care (Signed)
   Problem: Education: Goal: Knowledge of General Education information will improve Description: Including pain rating scale, medication(s)/side effects and non-pharmacologic comfort measures Outcome: Progressing   Problem: Nutrition: Goal: Adequate nutrition will be maintained Outcome: Progressing   Problem: Coping: Goal: Level of anxiety will decrease Outcome: Progressing

## 2024-04-21 DIAGNOSIS — D696 Thrombocytopenia, unspecified: Secondary | ICD-10-CM | POA: Diagnosis not present

## 2024-04-21 LAB — CULTURE, BLOOD (ROUTINE X 2)
Culture: NO GROWTH
Culture: NO GROWTH
Special Requests: ADEQUATE
Special Requests: ADEQUATE

## 2024-04-21 LAB — BPAM PLATELET PHERESIS
Blood Product Expiration Date: 202512152359
ISSUE DATE / TIME: 202512131348
Unit Type and Rh: 5100

## 2024-04-21 LAB — COMPREHENSIVE METABOLIC PANEL WITH GFR
ALT: 141 U/L — ABNORMAL HIGH (ref 0–44)
AST: 73 U/L — ABNORMAL HIGH (ref 15–41)
Albumin: 1.6 g/dL — ABNORMAL LOW (ref 3.5–5.0)
Alkaline Phosphatase: 102 U/L (ref 38–126)
Anion gap: 7 (ref 5–15)
BUN: 10 mg/dL (ref 8–23)
CO2: 23 mmol/L (ref 22–32)
Calcium: 7.4 mg/dL — ABNORMAL LOW (ref 8.9–10.3)
Chloride: 101 mmol/L (ref 98–111)
Creatinine, Ser: 0.49 mg/dL (ref 0.44–1.00)
GFR, Estimated: >60 mL/min (ref >60)
Glucose, Bld: 74 mg/dL (ref 70–99)
Potassium: 3.5 mmol/L (ref 3.5–5.1)
Sodium: 131 mmol/L — ABNORMAL LOW (ref 135–145)
Total Bilirubin: 1.1 mg/dL (ref 0.0–1.2)
Total Protein: 7.3 g/dL (ref 6.5–8.1)

## 2024-04-21 LAB — CBC WITH DIFFERENTIAL/PLATELET
Abs Immature Granulocytes: 0.02 K/uL (ref 0.00–0.07)
Basophils Absolute: 0 K/uL (ref 0.0–0.1)
Basophils Relative: 0 %
Eosinophils Absolute: 0.2 K/uL (ref 0.0–0.5)
Eosinophils Relative: 4 %
HCT: 21.7 % — ABNORMAL LOW (ref 36.0–46.0)
Hemoglobin: 7.2 g/dL — ABNORMAL LOW (ref 12.0–15.0)
Immature Granulocytes: 1 %
Lymphocytes Relative: 37 %
Lymphs Abs: 1.5 K/uL (ref 0.7–4.0)
MCH: 30.9 pg (ref 26.0–34.0)
MCHC: 33.2 g/dL (ref 30.0–36.0)
MCV: 93.1 fL (ref 80.0–100.0)
Monocytes Absolute: 0.3 K/uL (ref 0.1–1.0)
Monocytes Relative: 7 %
Neutro Abs: 2.1 K/uL (ref 1.7–7.7)
Neutrophils Relative %: 51 %
Platelets: 19 K/uL — CL (ref 150–400)
RBC: 2.33 MIL/uL — ABNORMAL LOW (ref 3.87–5.11)
RDW: 16.5 % — ABNORMAL HIGH (ref 11.5–15.5)
WBC: 4.1 K/uL (ref 4.0–10.5)
nRBC: 0.5 % — ABNORMAL HIGH (ref 0.0–0.2)

## 2024-04-21 LAB — PREPARE PLATELET PHERESIS: Unit division: 0

## 2024-04-21 LAB — GLUCOSE, CAPILLARY
Glucose-Capillary: 102 mg/dL — ABNORMAL HIGH (ref 70–99)
Glucose-Capillary: 109 mg/dL — ABNORMAL HIGH (ref 70–99)
Glucose-Capillary: 112 mg/dL — ABNORMAL HIGH (ref 70–99)
Glucose-Capillary: 112 mg/dL — ABNORMAL HIGH (ref 70–99)
Glucose-Capillary: 74 mg/dL (ref 70–99)
Glucose-Capillary: 83 mg/dL (ref 70–99)

## 2024-04-21 LAB — PHOSPHORUS: Phosphorus: 2.4 mg/dL — ABNORMAL LOW (ref 2.5–4.6)

## 2024-04-21 LAB — HEMOGLOBIN AND HEMATOCRIT, BLOOD
HCT: 26.1 % — ABNORMAL LOW (ref 36.0–46.0)
Hemoglobin: 8.8 g/dL — ABNORMAL LOW (ref 12.0–15.0)

## 2024-04-21 LAB — ANTI-DNA ANTIBODY, DOUBLE-STRANDED: ds DNA Ab: 4 [IU]/mL (ref 0–9)

## 2024-04-21 LAB — PREPARE RBC (CROSSMATCH)

## 2024-04-21 LAB — MAGNESIUM: Magnesium: 1.6 mg/dL — ABNORMAL LOW (ref 1.7–2.4)

## 2024-04-21 MED ORDER — ROMIPLOSTIM 250 MCG ~~LOC~~ SOLR
2.0000 ug/kg | SUBCUTANEOUS | Status: DC
  Administered 2024-04-21: 165 ug via SUBCUTANEOUS
  Filled 2024-04-21: qty 0.33

## 2024-04-21 MED ORDER — MAGNESIUM SULFATE 4 GM/100ML IV SOLN
4.0000 g | Freq: Once | INTRAVENOUS | Status: AC
  Administered 2024-04-21: 4 g via INTRAVENOUS
  Filled 2024-04-21: qty 100

## 2024-04-21 MED ORDER — SODIUM CHLORIDE 0.9% IV SOLUTION: Freq: Once | INTRAVENOUS | Status: AC

## 2024-04-21 MED ORDER — POTASSIUM CHLORIDE CRYS ER 20 MEQ PO TBCR
40.0000 meq | EXTENDED_RELEASE_TABLET | Freq: Once | ORAL | Status: AC
  Administered 2024-04-21: 40 meq via ORAL
  Filled 2024-04-21: qty 2

## 2024-04-21 MED ORDER — ENSURE PLUS HIGH PROTEIN PO LIQD
237.0000 mL | Freq: Two times a day (BID) | ORAL | Status: DC
  Administered 2024-04-21 – 2024-05-02 (×17): 237 mL via ORAL

## 2024-04-21 MED ORDER — POTASSIUM PHOSPHATES 15 MMOLE/5ML IV SOLN
30.0000 mmol | Freq: Once | INTRAVENOUS | Status: AC
  Administered 2024-04-21: 30 mmol via INTRAVENOUS
  Filled 2024-04-21: qty 10

## 2024-04-21 NOTE — Progress Notes (Signed)
 Hematology HEMATOLOGY-ONCOLOGY PROGRESS NOTE  SUBJECTIVE: Patient was sleeping when I stopped by to see her.   PHYSICAL EXAMINATION:    Vitals:   04/21/24 0327 04/21/24 0724  BP: 136/66 120/62  Pulse: 67 62  Resp: 20 15  Temp: 98.8 F (37.1 C) 98.6 F (37 C)  SpO2: 98% 96%   Filed Weights   04/18/24 0400 04/20/24 0500 04/21/24 0500  Weight: 172 lb 9.9 oz (78.3 kg) 181 lb 10.5 oz (82.4 kg) 181 lb 10.5 oz (82.4 kg)     LABORATORY DATA:  I have reviewed the data as listed    Latest Ref Rng & Units 04/21/2024    4:08 AM 04/20/2024    8:08 AM 04/19/2024    2:44 AM  CMP  Glucose 70 - 99 mg/dL 74  76  85   BUN 8 - 23 mg/dL 10  7  10    Creatinine 0.44 - 1.00 mg/dL 9.50  9.48  9.56   Sodium 135 - 145 mmol/L 131  135  142   Potassium 3.5 - 5.1 mmol/L 3.5  4.0  3.9   Chloride 98 - 111 mmol/L 101  102  108   CO2 22 - 32 mmol/L 23  27  27    Calcium  8.9 - 10.3 mg/dL 7.4  7.9  8.1   Total Protein 6.5 - 8.1 g/dL 7.3  6.8  5.7   Total Bilirubin 0.0 - 1.2 mg/dL 1.1  1.1  0.9   Alkaline Phos 38 - 126 U/L 102  115  127   AST 15 - 41 U/L 73  91  157   ALT 0 - 44 U/L 141  199  313     Lab Results  Component Value Date   WBC 4.1 04/21/2024   HGB 7.2 (L) 04/21/2024   HCT 21.7 (L) 04/21/2024   MCV 93.1 04/21/2024   PLT 19 (LL) 04/21/2024   NEUTROABS 2.1 04/21/2024    ASSESSMENT AND PLAN: 1.  Worsening thrombocytopenia: Felt to be immune related.  She received 2 doses of IVIG over the last 2 days.  With a platelet count of 19, I did not recommend a platelet transfusion yet.  We would give her a dose of Nplate  today. 2.  Worsening anemia: 1 unit of PRBC will be given separate 3.  Infectious endocarditis: Improving I discussed the case with Dr. Dennise who agrees with the plan.  Dr. Lanny will be in to see the patient from tomorrow.

## 2024-04-21 NOTE — Progress Notes (Signed)
 PROGRESS NOTE        PATIENT DETAILS Name: [name] Age: 78 y.o. Sex: female Date of Birth: 09-16-1945 Admit Date: 04/13/2024 Admitting Physician Tamela Stakes, MD ERE:Bzmryzrx, Rojelio PARAS, NP  Brief Summary: Patient is a 78 y.o.  female with history of CVA, COPD, HTN-who was brought to the ED after she was found by her roommate unresponsive (no heat/light-under a single blanket)-she was apparently covered in feces/dried blood-found to be hypothermic/hypotensive/hypoglycemic-she was thought to have undifferentiated shock-acute liver failure-acute kidney injury-admitted to the ICU-stabilized with pressors/D10 infusion/broad-spectrum antibiotics-rapidly improved and subsequently transferred to [facility].  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 12/8>> ANA: Positive 12/8>> anti-smooth muscle antibody: Negative  Significant microbiology data: 12/6>> COVID/influenza/RSV PCR: Negative 12/6>> blood culture: No growth 12/6>> acute hepatitis  serology: Negative 12/6>> CMV DNA PCR: Negative 12/9>> blood culture: No growth  Procedures: None  Consults: PCCM GI ID Hematology [name])  Subjective: No major issues overnight-lying comfortably in bed.  Appears stable overnight.  Objective: Vitals: Blood pressure (!) 112/48, pulse (!) 56, temperature 98.6 F (37 C), temperature source Axillary, resp. rate 15, height 5' 10 (1.778 m), weight 82.4 kg, SpO2 94%.   Exam: Awake-speaking in few word sentences today Chronically sick appearing Chronic left-sided hemiparesis with superimposed generalized weakness Abdomen: Soft nontender nondistended  Pertinent Labs/Radiology:    Latest Ref Rng & Units 04/21/2024    4:08 AM 04/20/2024    7:23 PM 04/20/2024    8:08 AM  CBC  WBC 4.0 - 10.5 K/uL 4.1  4.4  3.8   Hemoglobin 12.0 - 15.0 g/dL 7.2  8.5  7.7   Hematocrit 36.0 - 46.0 % 21.7  25.9  22.9   Platelets 150 - 400 K/uL 19  32  9     Lab Results  Component Value Date   NA 131 (L) 04/21/2024   K 3.5 04/21/2024   CL 101 04/21/2024   CO2 23 04/21/2024      Assessment/Plan: Undifferentiated shock Unclear etiology-initially thought to have septic shock but all cultures negative to date-has rapidly improved-initially on admission to the ICU was started on vancomycin /Zosyn  but all antibiotics were discontinued on 12/7.  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  Plans were to obtain TEE but per cardiology-unable to safely perform TEE unless platelet count persistently above 50K.  Will need to notify cardiology once platelet counts are persistently above 50K.  Acute metabolic encephalopathy Secondary to hypothermia/hypoglycemia/undifferentiated shock/acute liver failure Head  CT without acute abnormalities Seems to have clinically improved  after treatment of underlying etiologies.  Hypothermia Likely secondary to environmental exposure (per prior notes- cool room with single blanket-no heating or light) TSH stable Now normothermic after supportive care  Hypoglycemia Likely secondary to acute liver injury CBGs continue to be borderline-watch closely-supportive care for now-encourage oral intake  Recent Labs    04/21/24 0054 04/21/24 0329 04/21/24 0724  GLUCAP 102* 74 83     Acute liver failure Unclear etiology-possibly shock liver from hypotension from possible sepsis physiology LFTs/coags improving Dopplers without portal/hepatic vein thrombosis Acute hepatitis serology negative CMV IgM positive but viral load negative  Negative workup for autoimmune hepatitis GI following-completed N-acetylcysteine  12/10 Empirically on lactulose   Thrombocytopenia Likely secondary to acute liver injury/failure-possible sepsis physiology-however no improvement in spite of improvement in her sepsis physiology and liver injury.  No evidence of TTP on peripheral smear-unclear if this is ITP. Given continued transfusions as needed last on 04/20/2023.   Case discussed with hematology on-call-Dr. Gudina on 04/21/2024, no further platelet transfusions today,Nplate  injection planned on 04/21/2024 case discussed with Dr. Valley.  No signs of bleeding.  Normocytic anemia Likely secondary to critical illness Continue to trend/follow CBC, per hematology 1 unit of packed RBC on 04/21/2024.  Oropharyngeal dysphagia Secondary to critical illness/encephalopathy SLP following-on dysphagia 2 diet.  AKI Hemodynamically mediated due to hypotension Resolved.  Hypokalemia/Hypophosphatemia Repleted  Metabolic acidosis Likely due to delayed clearance of lactic acid-in the setting of liver failure. Improved with supportive care  Self-limited episode-small-volume coffee-ground emesis Hb stable PPI GI following-no endoscopic procedures  planned at this point  Chest pain Likely atypical Troponins minimally elevated-echo with stable EF Supportive care for now  Minimally elevated troponins Trend is flat Likely demand ischemia in the setting of hypotension/critical illness. Echo with able EF  HTN BP stable without use of any antihypertensives.  History of chronic HFpEF Status stable  History of CVA Baseline left hemiparesis Antiplatelets/statin held-in the setting of elevated liver enzymes and thrombocytopenia.  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 (Active)     Wound 04/14/24 0100 Pressure Injury Buttocks Left;Right Unstageable - Full thickness tissue loss in which the base of the injury is covered by slough (yellow, tan, gray, green or brown) and/or eschar (tan, brown or black) in the wound bed. (Active)     Wound 04/14/24 0100 Pressure Injury Sacrum Medial Stage 2 -  Partial thickness loss of dermis presenting as a shallow open injury with a red, pink wound bed without slough. (Active)    Code status:   Code Status: Full Code   DVT Prophylaxis: SCDs Start: 04/13/24 2344 Avoid pharmacological prophylaxis-given severity of thrombocytopenia   Family Communication: Daughter-Tiana-[phone] left VM 12/10,12/12.12/13   Disposition Plan: Status is: Inpatient Remains inpatient appropriate because: Severity of illness   Planned Discharge Destination:Skilled nursing facility   Diet: Diet Order             DIET DYS 2 Fluid  consistency: Thin  Diet effective now  Antimicrobial agents: Anti-infectives (From admission, onward)    Start     Dose/Rate Route Frequency Ordered Stop   04/16/24 1415  DAPTOmycin  (CUBICIN ) IVPB 700 mg/19mL premix        700 mg 200 mL/hr over 30 Minutes Intravenous Daily 04/16/24 1324     04/16/24 1415  cefTRIAXone  (ROCEPHIN ) 2 g in sodium chloride  0.9 % 100 mL IVPB        2 g 200 mL/hr over 30 Minutes Intravenous Daily 04/16/24 1324     04/14/24 0600  piperacillin -tazobactam (ZOSYN ) IVPB 3.375 g  Status:  Discontinued        3.375 g 12.5 mL/hr over 240 Minutes Intravenous Every 8 hours 04/14/24 0132 04/15/24 0920   04/13/24 2045  ceFEPIme  (MAXIPIME ) 2 g in sodium chloride  0.9 % 100 mL IVPB        2 g 200 mL/hr over 30 Minutes Intravenous  Once 04/13/24 2030 04/13/24 2147   04/13/24 2045  metroNIDAZOLE  (FLAGYL ) IVPB 500 mg        500 mg 100 mL/hr over 60 Minutes Intravenous  Once 04/13/24 2030 04/13/24 2310   04/13/24 2045  vancomycin  (VANCOCIN ) IVPB 1000 mg/200 mL premix  Status:  Discontinued        1,000 mg 200 mL/hr over 60 Minutes Intravenous  Once 04/13/24 2030 04/13/24 2032   04/13/24 2045  vancomycin  (VANCOREADY) IVPB 1750 mg/350 mL        1,750 mg 175 mL/hr over 120 Minutes Intravenous  Once 04/13/24 2032 04/13/24 2349        MEDICATIONS: Scheduled Meds:  sodium chloride    Intravenous Once   feeding supplement  237 mL Oral BID BM   Gerhardt's butt cream   Topical BID   lactulose   20 g Oral TID   multivitamin with minerals  1 tablet Oral Daily   mouth rinse  15 mL Mouth Rinse 4 times per day   pantoprazole   40 mg Oral BID   potassium chloride   40 mEq Oral Once   romiPLOStim   2 mcg/kg Subcutaneous Weekly   thiamine   100 mg Oral Daily   Continuous Infusions:  cefTRIAXone  (ROCEPHIN )  IV Stopped (04/20/24 1440)   DAPTOmycin  Stopped (04/20/24 1537)   potassium PHOSPHATE  IVPB (in mmol) 30 mmol (04/21/24 0836)   PRN Meds:.docusate,  ipratropium-albuterol , morphine  injection, ondansetron  (ZOFRAN ) IV, mouth rinse, polyethylene glycol   I have personally reviewed following labs and imaging studies  LABORATORY DATA: CBC: Recent Labs  Lab 04/17/24 0447 04/17/24 1017 04/18/24 0415 04/19/24 0244 04/20/24 0808 04/20/24 1923 04/21/24 0408  WBC 4.5  4.5   < > 4.2 4.7 3.8* 4.4 4.1  NEUTROABS 2.3  --   --   --   --   --  2.1  HGB 9.0*  9.0*   < > 9.0* 8.3* 7.7* 8.5* 7.2*  HCT 26.5*  26.4*   < > 26.2* 24.8* 22.9* 25.9* 21.7*  MCV 87.5  88.3   < > 89.7 89.2 91.2 92.5 93.1  PLT 17*  18*   < > 36* 29* 9* 32* 19*   < > = values in this interval not displayed.    Basic Metabolic Panel: Recent Labs  Lab 04/16/24 0345 04/17/24 0447 04/18/24 0415 04/19/24 0244 04/20/24 0808 04/21/24 0408  NA 145 142 142 142 135 131*  K 2.8* 2.9* 3.4* 3.9 4.0 3.5  CL 112* 110 107 108 102 101  CO2 17* 28 26 27 27 23   GLUCOSE 131* 104* 84 85 76 74  BUN 23 15 11 10  7* 10  CREATININE 0.80 0.56 0.35* 0.43* 0.51 0.49  CALCIUM  8.0* 7.9* 8.0* 8.1* 7.9* 7.4*  MG 1.8 2.1 1.8 2.2  --  1.6*  PHOS  --  <1.0* 1.9* 2.6  --  2.4*    GFR: Estimated Creatinine Clearance: 65.6 mL/min (by C-G formula based on SCr of 0.49 mg/dL).  Liver Function Tests: Recent Labs  Lab 04/17/24 0447 04/18/24 0415 04/19/24 0244 04/20/24 0808 04/21/24 0408  AST 219* 194* 157* 91* 73*  ALT 481* 371* 313* 199* 141*  ALKPHOS 104 118 127* 115 102  BILITOT 1.2 1.3* 0.9 1.1 1.1  PROT 5.3* 5.7* 5.7* 6.8 7.3  ALBUMIN  1.9* 2.1* 2.2* 1.9* 1.6*   No results for input(s): LIPASE, AMYLASE in the last 168 hours.  No results for input(s): AMMONIA in the last 168 hours.   Coagulation Profile: Recent Labs  Lab 04/16/24 0345 04/17/24 0447 04/17/24 1017 04/18/24 0415 04/19/24 0244  INR 1.5* 1.3* 1.3* 1.2 1.1    Cardiac Enzymes: Recent Labs  Lab 04/15/24 0955 04/17/24 0447  CKTOTAL 232 91    BNP (last 3 results) Recent Labs    01/11/24 2300   PROBNP 3,648.0*    Lipid Profile: No results for input(s): CHOL, HDL, LDLCALC, TRIG, CHOLHDL, LDLDIRECT in the last 72 hours.  Thyroid  Function Tests: No results for input(s): TSH, T4TOTAL, FREET4, T3FREE, THYROIDAB in the last 72 hours.   Anemia Panel: No results for input(s): VITAMINB12, FOLATE, FERRITIN, TIBC, IRON, RETICCTPCT in the last 72 hours.  Urine analysis:    Component Value Date/Time   COLORURINE AMBER (A) 04/13/2024 2041   APPEARANCEUR CLOUDY (A) 04/13/2024 2041   LABSPEC 1.025 04/13/2024 2041   PHURINE 5.0 04/13/2024 2041   GLUCOSEU NEGATIVE 04/13/2024 2041   HGBUR SMALL (A) 04/13/2024 2041   BILIRUBINUR SMALL (A) 04/13/2024 2041   KETONESUR NEGATIVE 04/13/2024 2041   PROTEINUR 100 (A) 04/13/2024 2041   UROBILINOGEN 1.0 11/15/2010 2015   NITRITE NEGATIVE 04/13/2024 2041   LEUKOCYTESUR NEGATIVE 04/13/2024 2041    Sepsis Labs: Lactic Acid, Venous    Component Value Date/Time   LATICACIDVEN 4.5 (HH) 04/15/2024 1538    MICROBIOLOGY: Recent Results (from the past 240 hours)  MRSA Next Gen by PCR, Nasal     Status: None   Collection Time: 04/13/24 12:51 AM   Specimen: Nasal Mucosa; Nasal Swab  Result Value Ref Range Status   MRSA by PCR Next Gen NOT DETECTED NOT DETECTED Final    Comment: (NOTE) The GeneXpert MRSA Assay (FDA approved for NASAL specimens only), is one component of a comprehensive MRSA colonization surveillance program. It is not intended to diagnose MRSA infection nor to guide or monitor treatment for MRSA infections. Test performance is not FDA approved in patients less than 40 years old. Performed at [facility], [address], [place], [place] [address]   Resp panel by RT-PCR (RSV, Flu A&B, Covid) Anterior Nasal Swab     Status: None   Collection Time: 04/13/24  8:30 PM   Specimen: Anterior Nasal Swab  Result Value Ref Range Status   SARS Coronavirus 2 by RT PCR NEGATIVE NEGATIVE Final    Influenza A by PCR NEGATIVE NEGATIVE Final   Influenza B by PCR NEGATIVE NEGATIVE Final    Comment: (NOTE) The Xpert Xpress SARS-CoV-2/FLU/RSV plus assay is intended as an aid in the diagnosis of influenza from Nasopharyngeal swab specimens and should not be used as a sole basis for treatment.  Nasal washings and aspirates are unacceptable for Xpert Xpress SARS-CoV-2/FLU/RSV testing.  Fact Sheet for Patients: bloggercourse.com  Fact Sheet for Healthcare Providers: seriousbroker.it  This test is not yet approved or cleared by the [place]  FDA and has been authorized for detection and/or diagnosis of SARS-CoV-2 by FDA under an Emergency Use Authorization (EUA). This EUA will remain in effect (meaning this test can be used) for the duration of the COVID-19 declaration under Section 564(b)(1) of the Act, 21 U.S.C. section 360bbb-3(b)(1), unless the authorization is terminated or revoked.     Resp Syncytial Virus by PCR NEGATIVE NEGATIVE Final    Comment: (NOTE) Fact Sheet for Patients: bloggercourse.com  Fact Sheet for Healthcare Providers: seriousbroker.it  This test is not yet approved or cleared by the [place]  FDA and has been authorized for detection and/or diagnosis of SARS-CoV-2 by FDA under an Emergency Use Authorization (EUA). This EUA will remain in effect (meaning this test can be used) for the duration of the COVID-19 declaration under Section 564(b)(1) of the Act, 21 U.S.C. section 360bbb-3(b)(1), unless the authorization is terminated or revoked.  Performed at [facility], [address], [place], [place] [address]   Blood Culture (routine x 2)     Status: None   Collection Time: 04/13/24  8:30 PM   Specimen: BLOOD  Result Value Ref Range Status   Specimen Description BLOOD SITE NOT SPECIFIED  Final   Special Requests   Final    BOTTLES DRAWN  AEROBIC AND ANAEROBIC Blood Culture adequate volume   Culture   Final    NO GROWTH 5 DAYS Performed at [facility], [address] [place], [place] [address]    Report Status 04/18/2024 FINAL  Final  Blood Culture (routine x 2)     Status: None   Collection Time: 04/13/24  8:35 PM   Specimen: BLOOD LEFT FOREARM  Result Value Ref Range Status   Specimen Description BLOOD LEFT FOREARM  Final   Special Requests   Final    BOTTLES DRAWN AEROBIC AND ANAEROBIC Blood Culture results may not be optimal due to an inadequate volume of blood received in culture bottles   Culture   Final    NO GROWTH 5 DAYS Performed at [facility], [address] [place], [place] [address]    Report Status 04/18/2024 FINAL  Final  Culture, blood (Routine X 2) w Reflex to ID Panel     Status: None   Collection Time: 04/16/24 11:42 AM   Specimen: BLOOD  Result Value Ref Range Status   Specimen Description BLOOD SITE NOT SPECIFIED  Final   Special Requests   Final    BOTTLES DRAWN AEROBIC AND ANAEROBIC Blood Culture adequate volume   Culture   Final    NO GROWTH 5 DAYS Performed at [facility], [address] [place], [place] [address]    Report Status 04/21/2024 FINAL  Final  Culture, blood (Routine X 2) w Reflex to ID Panel     Status: None   Collection Time: 04/16/24  2:56 PM   Specimen: BLOOD RIGHT ARM  Result Value Ref Range Status   Specimen Description BLOOD RIGHT ARM  Final   Special Requests   Final    BOTTLES DRAWN AEROBIC AND ANAEROBIC Blood Culture adequate volume   Culture   Final    NO GROWTH 5 DAYS Performed at [facility], [address] [place], [place] [address]  Report Status 04/21/2024 FINAL  Final    RADIOLOGY STUDIES/RESULTS: No results found.    LOS: 8 days   Lavada Stank, MD  [facility]    To contact the attending provider between 7A-7P or the covering provider during after hours 7P-7A, please log into the web site  [email or web address] and access using universal [place] password for that web site. If you do not have the password, please call the hospital operator.  04/21/2024, 10:49 AM

## 2024-04-21 NOTE — Plan of Care (Signed)
°  Problem: Clinical Measurements: Goal: Ability to maintain clinical measurements within normal limits will improve Outcome: Progressing Goal: Will remain free from infection Outcome: Progressing Goal: Diagnostic test results will improve Outcome: Progressing   Problem: Activity: Goal: Risk for activity intolerance will decrease Outcome: Progressing   Problem: Nutrition: Goal: Adequate nutrition will be maintained Outcome: Progressing   Problem: Pain Managment: Goal: General experience of comfort will improve and/or be controlled Outcome: Progressing   Problem: Skin Integrity: Goal: Risk for impaired skin integrity will decrease Outcome: Progressing

## 2024-04-22 ENCOUNTER — Inpatient Hospital Stay (HOSPITAL_COMMUNITY)

## 2024-04-22 DIAGNOSIS — I471 Supraventricular tachycardia, unspecified: Secondary | ICD-10-CM | POA: Diagnosis not present

## 2024-04-22 LAB — CBC WITH DIFFERENTIAL/PLATELET
Abs Immature Granulocytes: 0.02 K/uL (ref 0.00–0.07)
Basophils Absolute: 0 K/uL (ref 0.0–0.1)
Basophils Relative: 0 %
Eosinophils Absolute: 0.2 K/uL (ref 0.0–0.5)
Eosinophils Relative: 5 %
HCT: 27 % — ABNORMAL LOW (ref 36.0–46.0)
Hemoglobin: 9.1 g/dL — ABNORMAL LOW (ref 12.0–15.0)
Immature Granulocytes: 1 %
Lymphocytes Relative: 35 %
Lymphs Abs: 1.5 K/uL (ref 0.7–4.0)
MCH: 30.8 pg (ref 26.0–34.0)
MCHC: 33.7 g/dL (ref 30.0–36.0)
MCV: 91.5 fL (ref 80.0–100.0)
Monocytes Absolute: 0.4 K/uL (ref 0.1–1.0)
Monocytes Relative: 9 %
Neutro Abs: 2.2 K/uL (ref 1.7–7.7)
Neutrophils Relative %: 50 %
Platelets: 14 K/uL — CL (ref 150–400)
RBC: 2.95 MIL/uL — ABNORMAL LOW (ref 3.87–5.11)
RDW: 17.1 % — ABNORMAL HIGH (ref 11.5–15.5)
WBC: 4.2 K/uL (ref 4.0–10.5)
nRBC: 0 % (ref 0.0–0.2)

## 2024-04-22 LAB — TYPE AND SCREEN
ABO/RH(D): B POS
Antibody Screen: NEGATIVE
Unit division: 0

## 2024-04-22 LAB — GLUCOSE, CAPILLARY
Glucose-Capillary: 102 mg/dL — ABNORMAL HIGH (ref 70–99)
Glucose-Capillary: 120 mg/dL — ABNORMAL HIGH (ref 70–99)
Glucose-Capillary: 141 mg/dL — ABNORMAL HIGH (ref 70–99)
Glucose-Capillary: 63 mg/dL — ABNORMAL LOW (ref 70–99)
Glucose-Capillary: 86 mg/dL (ref 70–99)
Glucose-Capillary: 87 mg/dL (ref 70–99)
Glucose-Capillary: 90 mg/dL (ref 70–99)
Glucose-Capillary: 94 mg/dL (ref 70–99)

## 2024-04-22 LAB — COMPREHENSIVE METABOLIC PANEL WITH GFR
ALT: 119 U/L — ABNORMAL HIGH (ref 0–44)
AST: 67 U/L — ABNORMAL HIGH (ref 15–41)
Albumin: 1.8 g/dL — ABNORMAL LOW (ref 3.5–5.0)
Alkaline Phosphatase: 99 U/L (ref 38–126)
Anion gap: 4 — ABNORMAL LOW (ref 5–15)
BUN: 9 mg/dL (ref 8–23)
CO2: 24 mmol/L (ref 22–32)
Calcium: 7.3 mg/dL — ABNORMAL LOW (ref 8.9–10.3)
Chloride: 104 mmol/L (ref 98–111)
Creatinine, Ser: 0.56 mg/dL (ref 0.44–1.00)
GFR, Estimated: >60 mL/min (ref >60)
Glucose, Bld: 71 mg/dL (ref 70–99)
Potassium: 3.7 mmol/L (ref 3.5–5.1)
Sodium: 132 mmol/L — ABNORMAL LOW (ref 135–145)
Total Bilirubin: 1.2 mg/dL (ref 0.0–1.2)
Total Protein: 7.3 g/dL (ref 6.5–8.1)

## 2024-04-22 LAB — BPAM RBC
Blood Product Expiration Date: 202601052359
ISSUE DATE / TIME: 202512141152
Unit Type and Rh: 7300

## 2024-04-22 LAB — PHOSPHORUS: Phosphorus: 2.8 mg/dL (ref 2.5–4.6)

## 2024-04-22 LAB — MAGNESIUM: Magnesium: 2 mg/dL (ref 1.7–2.4)

## 2024-04-22 MED ORDER — DEXTROSE 50 % IV SOLN
50.0000 mL | Freq: Once | INTRAVENOUS | Status: AC
  Administered 2024-04-22: 06:00:00 50 mL via INTRAVENOUS
  Filled 2024-04-22: qty 50

## 2024-04-22 MED ORDER — METOPROLOL TARTRATE 12.5 MG HALF TABLET
12.5000 mg | ORAL_TABLET | Freq: Two times a day (BID) | ORAL | Status: DC
  Administered 2024-04-23: 12.5 mg via ORAL
  Filled 2024-04-22 (×2): qty 1

## 2024-04-22 MED ORDER — LACTATED RINGERS IV BOLUS: 1000.0000 mL | Freq: Once | INTRAVENOUS | Status: DC

## 2024-04-22 MED ORDER — METOPROLOL TARTRATE 5 MG/5ML IV SOLN
2.5000 mg | INTRAVENOUS | Status: DC | PRN
  Administered 2024-04-25 – 2024-04-27 (×2): 2.5 mg via INTRAVENOUS
  Filled 2024-04-22 (×3): qty 5

## 2024-04-22 MED ORDER — SODIUM CHLORIDE 0.9% IV SOLUTION: Freq: Once | INTRAVENOUS | Status: AC

## 2024-04-22 MED ORDER — SODIUM CHLORIDE 0.9 % IV BOLUS
1000.0000 mL | Freq: Once | INTRAVENOUS | Status: AC
  Administered 2024-04-22: 23:00:00 400 mL via INTRAVENOUS

## 2024-04-22 NOTE — Plan of Care (Signed)
  Problem: Health Behavior/Discharge Planning: Goal: Ability to manage health-related needs will improve Outcome: Progressing   Problem: Clinical Measurements: Goal: Will remain free from infection Outcome: Progressing   Problem: Nutrition: Goal: Adequate nutrition will be maintained Outcome: Progressing   Problem: Elimination: Goal: Will not experience complications related to bowel motility Outcome: Progressing   Problem: Pain Managment: Goal: General experience of comfort will improve and/or be controlled Outcome: Progressing   Problem: Skin Integrity: Goal: Risk for impaired skin integrity will decrease Outcome: Progressing

## 2024-04-22 NOTE — Progress Notes (Signed)
 Hypoglycemic Event  CBG: 63 mg/dl   Treatment: 4 oz juice/soda and D50 50 mL (25 gm)  Symptoms: Hungry  Follow-up CBG: Time:0600 CBG Result:141  Possible Reasons for Event: Inadequate meal intake  Comments/MD notified:MD Howerter    [employer]

## 2024-04-22 NOTE — Progress Notes (Signed)

## 2024-04-22 NOTE — Significant Event (Signed)
 Rapid Response Event Note   Reason for Call :  Tachycardia-160s  Initial Focused Assessment:  Pt lying in bed with eyes open, in no visible distress. She denies CP/SOB/dizziness. Lungs diminished t/o. Heart tones fast. ABD soft, NT/ND. Skin warm and dry.   T-99.4, HR-160s, BP-85/56>74/48, RR-21, SpO2-96% on RA  Interventions:  EKG-SVT, LVH with repolarization abn,  marked ST wave abn, possible anterior subendocardial injury 1L NS bolus(stopped after approx 400cc due to , HR-75, BP-120s 2251-Pt converted to SR-70s>Repeat EKG-NSR, nonspecific ST and T wave abn.   CBCD/CMP/MG/BNP, U/A PCXR Plan of Care:  Pt now NSR. ST depressions are gone. Await lab values. 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 Cards planning to start meto tart PO. Plan for adenosine  if AVNRT reoccurs. Please call RRT if further assistance needed.  Event Summary:   MD Notified: Dr. Marcene notified. Cards consulted and Dr. Gail to bedside. Call Time:2222 Arrival Time:2225 End Upfz:[phone]  Tish Graeme Piety, RN

## 2024-04-22 NOTE — Progress Notes (Signed)
 TEE/ final plan

## 2024-04-22 NOTE — TOC Progression Note (Signed)
 Transition of Care [facility]) - Progression Note    Patient Details  Name: [name] MRN: [id number] Date of Birth: Jan 22, 1946  [facility]) CM/SW Contact  [name], LCSW Phone Number: 04/22/2024, 11:50 AM  Clinical Narrative:    CSW provided update to [facility].    Expected Discharge Plan: Skilled Nursing Facility Barriers to Discharge: Continued Medical Work up               Expected Discharge Plan and Services In-house Referral: Clinical Social Work   Post Acute Care Choice: Skilled Nursing Facility Living arrangements for the past 2 months: Single Family Home                                       Social Drivers of Health (SDOH) Interventions SDOH Screenings   Food Insecurity: Food Insecurity Present (04/15/2024)  Housing: High Risk (04/22/2024)  Transportation Needs: Unmet Transportation Needs (04/15/2024)  Utilities: At Risk (04/15/2024)  Depression (PHQ2-9): Low Risk (06/04/2021)  Social Connections: Unknown (04/15/2024)  Tobacco Use: Medium Risk (03/22/2024)    Readmission Risk Interventions    01/15/2024    2:24 PM  Readmission Risk Prevention Plan  Transportation Screening Complete  HRI or Home Care Consult Complete  Social Work Consult for Recovery Care Planning/Counseling Complete  Palliative Care Screening Not Applicable  Medication Review [employer]) Complete

## 2024-04-22 NOTE — Progress Notes (Signed)
 Speech Language Pathology Treatment: Dysphagia  Patient Details Name: [name] MRN: [id number] DOB: 10/18/1945 Today's Date: 04/22/2024 Time: 8557-8547 SLP Time Calculation (min) (ACUTE ONLY): 10 min  Assessment / Plan / Recommendation Clinical Impression  Recommend advancing pt's diet to a mechanical soft consistency with continuation of thin liquids and PO meds whole in applesauce or pudding.   Pt continues to exhibit mildly prolonged mastication efforts with regular solids and min oral residue, primarily on hard palate. Residue is cleared with prompted liquid wash.  Observed delayed throat clearing x1 instance. Pt encouraged to clear throat again + dry swallow to clear suspected residue.   Will try to advance diet; however, if pt does not tolerate mechanical soft textures well, can be downgraded back to mechanical altered consistency.   Continue SLP PoC.    HPI HPI: [name] is a 78 y/o female presenting 04/14/24 after roommate called EMS as the patient was unresponsive. She reports chest pain and was very confused. CT no acute intracranial abnormality.  2. Stable right parietal cortical encephalomalacia     PMHx:  HTN, HLD, CVA left hemiparesis, CAPD, DVT, IVH. UA positive. SLP consulted for clinical swallow assessment. Most recent MBSS completed on 04/18/24 with recommendations of a D2 diet and thin liquids.      SLP Plan  Continue with current plan of care         Recommendations  Diet recommendations: Dysphagia 3 (mechanical soft);Thin liquid Liquids provided via: Straw;Cup Medication Administration: Whole meds with puree Supervision: Full supervision/cueing for compensatory strategies;Staff to assist with self feeding Compensations: Slow rate;Small sips/bites;Clear throat after each swallow Postural Changes and/or Swallow Maneuvers: Seated upright 90 degrees;Upright 30-60 min after meal                  Oral care BID   Frequent or constant  Supervision/Assistance Dysphagia, oropharyngeal phase (R13.12)     Continue with current plan of care     [name]  04/22/2024, 2:58 PM

## 2024-04-22 NOTE — Consult Note (Signed)
 Cardiology Consultation   Patient ID: [name] MRN: [id number]; DOB: Dec 21, 1945  Admit date: 04/13/2024 Date of Consult: 04/22/2024  PCP:  Stephen Rojelio PARAS, NP   [facility] Providers Cardiologist:  Aleene Passe, MD (Inactive)        Patient Profile: [name] is a 78 y.o. female with a hx of with COPD, CVA, and hypertension who is being seen 04/22/2024 for the evaluation of tachycardia at the request of the Hospitalist service.  History of Present Illness: [name] was admitted on 04/13/2024 for altered mental status with undifferentiated shock.  TTE showed mobile vegetation on the aortic valve concerning for endocarditis.  She was then initially in the ICU and treated with broad-spectrum antibiotics, however her symptoms have improved.  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  She has been improving on the floor.  Has not been on pressors.  Recent labs this morning reveal sodium 132, potassium 3.7, serum creatinine 0.56 magnesium  2.0.  Echocardiogram during this admission shows an EF of 50 to 55%, with normal RV function, overnight, patient went into a sudden onset narrow complex tachycardia for 30 minutes.  Her systolic blood pressures were initially 120s to 130s with mean arterial pressure over 80, however her blood pressures gradually decreased to systolic blood pressure in the 70s with mean arterial pressure in the 50s.  She was asymptomatic during this time, however she still remains encephalopathic.  Her rhythm spontaneously converted to sinus rhythm after she was started on a normal saline fluid.   Past Medical History:  Diagnosis Date   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 (HCC) 09/16/2020   COPD (chronic obstructive pulmonary disease) (HCC)    History of DVT (deep vein thrombosis)    Hypertension    Thoracoabdominal aortic aneurysm     TIA (transient ischemic attack) 09/10/2020    Past Surgical History:  Procedure Laterality Date   IR BONE MARROW BIOPSY & ASPIRATION  12/13/2023   IR CT HEAD LTD  09/11/2020   IR INTRAVSC STENT CERV CAROTID W/O EMB-PROT MOD SED INC ANGIO  09/11/2020   IR PERCUTANEOUS ART THROMBECTOMY/INFUSION INTRACRANIAL INC DIAG ANGIO  09/11/2020       IR PERCUTANEOUS ART THROMBECTOMY/INFUSION INTRACRANIAL INC DIAG ANGIO  09/11/2020   IR US  GUIDE VASC ACCESS RIGHT  09/11/2020   NO PAST SURGERIES     RADIOLOGY WITH ANESTHESIA N/A 09/11/2020   Procedure: IR WITH ANESTHESIA;  Surgeon: Dolphus Carrion, MD;  Location: [facility] OR;  Service: Radiology;  Laterality: N/A;     Home Medications:  Prior to Admission medications  Medication Sig Start Date End Date Taking? Authorizing Provider  albuterol  (VENTOLIN  HFA) 108 (90 Base) MCG/ACT inhaler Inhale 2 puffs into the lungs every 6 (six) hours as needed for wheezing or shortness of breath. 01/09/24   Darci Pore, MD  amLODipine  (NORVASC ) 10 MG tablet Take 1 tablet (10 mg total) by mouth daily. 12/15/23   Rojelio Nest, DO  furosemide  (LASIX ) 40 MG tablet Take 1 tablet (40 mg total) by mouth daily. 01/19/24   Patsy Lenis, MD  hydrALAZINE  (APRESOLINE ) 50 MG tablet Take 1 tablet (50 mg total) by mouth every 8 (eight) hours. 12/15/23   Rojelio Nest, DO  hydrOXYzine  (ATARAX ) 25 MG tablet Take 1 tablet (25 mg total) by mouth 3 (three) times daily as needed for anxiety. 01/19/24   Patsy Lenis, MD  mirtazapine (REMERON) 7.5 MG tablet Take 7.5 mg  by mouth at bedtime.    [provider]  pantoprazole  (PROTONIX ) 40 MG tablet Take 1 tablet (40 mg total) by mouth daily. 12/15/23   Rojelio Nest, DO  polyethylene glycol (MIRALAX  / GLYCOLAX ) 17 g packet Take 17 g by mouth daily. 12/15/23   Rojelio Nest, DO  rosuvastatin  (CRESTOR ) 20 MG tablet TAKE 1 TABLET(20 MG) BY MOUTH DAILY Patient taking differently: Take 20 mg by mouth daily. 08/23/23   Jaycee Greig PARAS, NP     Scheduled Meds:  feeding supplement  237 mL Oral BID BM   Gerhardt's butt cream   Topical BID   lactulose   20 g Oral TID   multivitamin with minerals  1 tablet Oral Daily   mouth rinse  15 mL Mouth Rinse 4 times per day   pantoprazole   40 mg Oral BID   romiPLOStim   2 mcg/kg Subcutaneous Weekly   thiamine   100 mg Oral Daily   Continuous Infusions:  cefTRIAXone  (ROCEPHIN )  IV Stopped (04/22/24 1628)   DAPTOmycin  Stopped (04/22/24 1714)   PRN Meds: docusate, ipratropium-albuterol , metoprolol  tartrate, morphine  injection, ondansetron  (ZOFRAN ) IV, mouth rinse, polyethylene glycol  Allergies:   Allergies[1]  Social History:   Social History   Socioeconomic History   Marital status: Single    Spouse name: Not on file   Number of children: Not on file   Years of education: Not on file   Highest education level: Not on file  Occupational History   Not on file  Tobacco Use   Smoking status: Former    Current packs/day: 0.00    Types: Cigarettes    Quit date: 05/10/1987    Years since quitting: 36.9   Smokeless tobacco: Never  Vaping Use   Vaping status: Never Used  Substance and Sexual Activity   Alcohol use: Not Currently   Drug use: Never   Sexual activity: Not Currently    Birth control/protection: None  Other Topics Concern   Not on file  Social History Narrative   Not on file   Social Drivers of Health   Tobacco Use: Medium Risk (03/22/2024)   Patient History    Smoking Tobacco Use: Former    Smokeless Tobacco Use: Never    Passive Exposure: Not on Actuary Strain: Not on file  Food Insecurity: Food Insecurity Present (04/15/2024)   Epic    Worried About Programme Researcher, Broadcasting/film/video in the Last Year: Often true    Barista in the Last Year: Often true  Transportation Needs: Unmet Transportation Needs (04/15/2024)   Epic    Lack of Transportation (Medical): Yes    Lack of Transportation (Non-Medical): Yes  Physical Activity: Not on file   Stress: Not on file  Social Connections: Unknown (04/15/2024)   Social Connection and Isolation Panel    Frequency of Communication with Friends and Family: Patient unable to answer    Frequency of Social Gatherings with Friends and Family: Patient unable to answer    Attends Religious Services: Patient unable to answer    Active Member of Clubs or Organizations: Patient unable to answer    Attends Banker Meetings: Patient unable to answer    Marital Status: Not on file  Intimate Partner Violence: Patient Unable To Answer (04/15/2024)   Epic    Fear of Current or Ex-Partner: Patient unable to answer    Emotionally Abused: Patient unable to answer    Physically Abused: Patient unable to answer    Sexually  Abused: Patient unable to answer  Depression (PHQ2-9): Low Risk (06/04/2021)   Depression (PHQ2-9)    PHQ-2 Score: 0  Alcohol Screen: Not on file  Housing: High Risk (04/22/2024)   Epic    Unable to Pay for Housing in the Last Year: Yes    Number of Times Moved in the Last Year: 0    Homeless in the Last Year: Patient unable to answer  Utilities: At Risk (04/15/2024)   Epic    Threatened with loss of utilities: Already shut off  Health Literacy: Not on file    Family History:    Family History  Problem Relation Age of Onset   Hypertension Mother      ROS:  Please see the history of present illness.   All other ROS reviewed and negative.     Physical Exam/Data: Vitals:   04/22/24 2247 04/22/24 2250 04/22/24 2252 04/22/24 2255  BP: (!) 70/51 104/70 (!) 126/59 131/61  Pulse: (!) 165 (!) 104 75 74  Resp: (!) 27 (!) 32 (!) 22 19  Temp:      TempSrc:      SpO2: 97% 94% 98% 97%  Weight:      Height:        Intake/Output Summary (Last 24 hours) at 04/22/2024 2320 Last data filed at 04/22/2024 1840 Gross per 24 hour  Intake 2196.5 ml  Output 350 ml  Net 1846.5 ml      04/22/2024    5:00 AM 04/21/2024    5:00 AM 04/20/2024    5:00 AM  Last 3 Weights   Weight (lbs) 183 lb 6.8 oz 181 lb 10.5 oz 181 lb 10.5 oz  Weight (kg) 83.2 kg 82.4 kg 82.4 kg     Body mass index is 26.32 kg/m.  General:  Well nourished, well developed, in no acute distress HEENT: normal Neck: no JVD Vascular: No carotid bruits; Distal pulses 2+ bilaterally Cardiac:  normal S1, S2; RRR; no murmur  Lungs:  clear to auscultation bilaterally, no wheezing, rhonchi or rales  Abd: soft, nontender, no hepatomegaly  Ext: no edema Musculoskeletal:  No deformities, BUE and BLE strength normal and equal Skin: warm and dry  Neuro:  CNs 2-12 intact, no focal abnormalities noted Psych:  Normal affect   EKG:  The EKG was personally reviewed and demonstrates:  narrow complex tachycardia Telemetry:  Telemetry was personally reviewed and demonstrates:    Relevant CV Studies: reviewed  Laboratory Data: High Sensitivity Troponin:   Recent Labs  Lab 04/13/24 2123 04/13/24 2308  TROPONINIHS 316* 254*   No results for input(s): TRNPT in the last 720 hours.    Chemistry Recent Labs  Lab 04/19/24 0244 04/20/24 0808 04/21/24 0408 04/22/24 0259  NA 142 135 131* 132*  K 3.9 4.0 3.5 3.7  CL 108 102 101 104  CO2 27 27 23 24   GLUCOSE 85 76 74 71  BUN 10 7* 10 9  CREATININE 0.43* 0.51 0.49 0.56  CALCIUM  8.1* 7.9* 7.4* 7.3*  MG 2.2  --  1.6* 2.0  GFRNONAA >60 >60 >60 >60  ANIONGAP 7 6 7  4*    Recent Labs  Lab 04/20/24 0808 04/21/24 0408 04/22/24 0259  PROT 6.8 7.3 7.3  ALBUMIN  1.9* 1.6* 1.8*  AST 91* 73* 67*  ALT 199* 141* 119*  ALKPHOS 115 102 99  BILITOT 1.1 1.1 1.2   Lipids No results for input(s): CHOL, TRIG, HDL, LABVLDL, LDLCALC, CHOLHDL in the last 168 hours.  Hematology Recent Labs  Lab 04/20/24 1923 04/21/24 0408 04/21/24 1644 04/22/24 0259  WBC 4.4 4.1  --  4.2  RBC 2.80* 2.33*  --  2.95*  HGB 8.5* 7.2* 8.8* 9.1*  HCT 25.9* 21.7* 26.1* 27.0*  MCV 92.5 93.1  --  91.5  MCH 30.4 30.9  --  30.8  MCHC 32.8 33.2  --  33.7  RDW 16.0*  16.5*  --  17.1*  PLT 32* 19*  --  14*   Thyroid  No results for input(s): TSH, FREET4 in the last 168 hours.  BNPNo results for input(s): BNP, PROBNP in the last 168 hours.  DDimer  Recent Labs  Lab 04/17/24 1017  DDIMER 19.61*    Radiology/Studies:  DG Chest Port 1 View Result Date: 04/22/2024 EXAM: 1 VIEW(S) XRAY OF THE CHEST 04/22/2024 10:41:00 PM COMPARISON: 04/13/2024 CLINICAL HISTORY: Tachycardia. FINDINGS: LUNGS AND PLEURA: Right mid lung zone linear atelectasis. Left hemidiaphragm calcified pleural plaque. Elevated right hemidiaphragm. No pleural effusion. No pneumothorax. HEART AND MEDIASTINUM: Aortic atherosclerosis. No acute abnormality of the cardiac and mediastinal silhouettes. BONES AND SOFT TISSUES: No acute osseous abnormality. IMPRESSION: 1. No acute cardiopulmonary process. 2. Right mid lung linear atelectasis. 3. Calcified pleural plaque along the left hemidiaphragm. Electronically signed by: [name] 04/22/2024 10:44 PM EST RP Workstation: [id number]     Assessment and Plan:  [name] is a 78 y.o. female with a hx of with COPD, CVA, and hypertension who is being seen 04/22/2024 for the evaluation of tachycardia at the request of the Hospitalist service.  #SVT #AVNRT Reviewed telemetry strip during episode of SVT.  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  No evidence of atrial fibrillation or atrial flutter.  Etiology likely in the setting of acute illness.  If she has no contraindication, will start low-dose beta-blocker.  If she converts back to an SVT, would first attempt vagal maneuvers, followed by adenosine .  If she is hemodynamically unstable, would recommend direct-current cardioversion.  #recommendations -start metoprolol  tartrate 12.5mg  BID -if SVT reoccurs, first try vagal maneuvers, followed by 6mg  adenosine  if vagal maneuveurs unsuccessful.  -goal K>4, Mg>2   Risk Assessment/Risk Scores:               For questions or updates, please contact [facility] Please consult [email or web address] for contact info under      Signed, Taheera Thomann A Marolyn Urschel, MD  04/22/2024 11:20 PM     [1]  Allergies Allergen Reactions   Iodinated Contrast Media Itching and Other (See Comments)    9/1 developed diffuse itching after contrast dye for CT (without rash or airway involvement or GI symptoms) . Consider pretreatment before IV contrast in the future

## 2024-04-22 NOTE — Progress Notes (Addendum)
 PROGRESS NOTE        PATIENT DETAILS Name: [name] Age: 78 y.o. Sex: female Date of Birth: 05-12-1945 Admit Date: 04/13/2024 Admitting Physician Tamela Stakes, MD ERE:Bzmryzrx, Rojelio PARAS, NP  Brief Summary: Patient is a 78 y.o.  female with history of CVA, COPD, HTN-who was brought to the ED after she was found by her roommate unresponsive (no heat/light-under a single blanket)-she was apparently covered in feces/dried blood-found to be hypothermic/hypotensive/hypoglycemic-she was thought to have undifferentiated shock-acute liver failure-acute kidney injury-admitted to the ICU-stabilized with pressors/D10 infusion/broad-spectrum antibiotics-rapidly improved and subsequently transferred to [facility].  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 12/8>> ANA: Positive 12/8>> anti-smooth muscle antibody: Negative  Significant microbiology data: 12/6>> COVID/influenza/RSV PCR: Negative 12/6>> blood culture: No growth 12/6>> acute hepatitis  serology: Negative 12/6>> CMV DNA PCR: Negative 12/9>> blood culture: No growth  Procedures: None  Consults: PCCM GI ID Hematology [name])  Subjective:   Patient in bed, appears comfortable, denies any headache, no fever, no chest pain or pressure, no shortness of breath , no abdominal pain. No new focal weakness.  Objective: Vitals: Blood pressure (!) 122/49, pulse 79, temperature 98.8 F (37.1 C), resp. rate (!) 29, height 5' 10 (1.778 m), weight 83.2 kg, SpO2 97%.   Exam:  Awake Alert, No new F.N deficits, left-sided weakness from stroke [place].AT,PERRAL Supple Neck, No JVD,   Symmetrical Chest wall movement, Good air movement bilaterally, CTAB RRR,No Gallops, Rubs or new Murmurs,  +ve B.Sounds, Abd Soft, No tenderness,   No Cyanosis, Clubbing or edema    Assessment/Plan:  Undifferentiated shock Unclear etiology-initially thought to have septic shock but all cultures negative to date-has rapidly improved-initially on admission to the ICU was started on vancomycin /Zosyn  but all antibiotics were discontinued on 12/7.  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  Plans were to obtain TEE but per cardiology-unable to safely perform TEE unless platelet count persistently above 50K.  Will need to notify cardiology once platelet counts are persistently above 50K.  Thrombocytopenia Likely secondary to acute liver injury/failure-possible sepsis physiology-however no improvement in spite of improvement in her sepsis physiology and liver injury.  No evidence of TTP on peripheral smear-unclear if this is ITP. Given continued transfusions as needed last on 04/20/2023.   Case discussed with hematology on-call-Dr. Gudina on 04/21/2024, no further platelet transfusions today, Nplate  started on 04/21/2024,  hematology following, 1 unit of platelet transfusion due to persistent severe thrombocytopenia on 04/22/2024, discussed with  hematology on 04/21/2024 and 04/22/2024.  Normocytic anemia Likely secondary to critical illness Continue to trend/follow CBC, per hematology 1 unit of packed RBC on 04/21/2024.  Acute metabolic encephalopathy Secondary to hypothermia/hypoglycemia/undifferentiated shock/acute liver failure Head CT without acute abnormalities Seems to have clinically improved after treatment of underlying etiologies.  Hypothermia Likely secondary to environmental exposure (per prior notes- cool room with single blanket-no heating or light) TSH stable Now normothermic after supportive care  Hypoglycemia Likely secondary to acute liver injury and extremely poor oral intake CBGs continue to be borderline-watch closely-supportive care, staff requested to encourage patient on better oral intake and help her with her meals  Recent Labs    04/22/24 0017 04/22/24 0500 04/22/24 0604  GLUCAP 87 63* 141*     Acute liver failure Unclear etiology-possibly shock liver from hypotension from possible sepsis physiology LFTs/coags improving Dopplers without portal/hepatic vein thrombosis Acute hepatitis serology negative CMV IgM positive but viral load negative  Negative workup for autoimmune hepatitis GI following-completed N-acetylcysteine  12/10 Empirically on lactulose   Oropharyngeal dysphagia Secondary to critical illness/encephalopathy SLP following-on dysphagia 2 diet.  AKI Hemodynamically mediated due to hypotension Resolved.  Hypokalemia/Hypophosphatemia Repleted  Metabolic acidosis Likely due to delayed clearance of lactic acid-in the setting of liver failure. Improved with supportive care  Self-limited episode-small-volume coffee-ground emesis Hb stable PPI GI following-no endoscopic procedures planned at this point  Chest pain Likely atypical Troponins  minimally elevated-echo with stable EF Supportive care for now  Minimally elevated troponins Trend is flat Likely demand ischemia in the setting of hypotension/critical illness. Echo with able EF  HTN BP stable without use of any antihypertensives.  History of chronic HFpEF Status stable  History of CVA Baseline left hemiparesis Antiplatelets/statin held-in the setting of elevated liver enzymes and thrombocytopenia.  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 (Active)     Wound 04/14/24 0100 Pressure Injury Buttocks Left;Right Unstageable - Full thickness tissue loss in which the base of the injury is covered by slough (yellow, tan, gray, green or brown) and/or eschar (tan, brown or black) in the wound bed. (Active)     Wound 04/14/24 0100 Pressure Injury Sacrum Medial Stage 2 -  Partial thickness loss of dermis presenting as a shallow open injury with a red, pink wound bed without slough. (Active)    Code status:   Code Status: Full Code   DVT Prophylaxis: SCDs Start: 04/13/24 2344 Avoid pharmacological prophylaxis-given severity of thrombocytopenia   Family Communication: Daughter-[phone] left VM 12/10,12/12.12/13   Disposition Plan: Status is: Inpatient Remains inpatient appropriate because: Severity of illness   Planned Discharge Destination:Skilled nursing facility   Diet: Diet Order             DIET DYS 2 Fluid consistency: Thin  Diet effective now                     Data  Review:   Patient Lines/Drains/Airways Status     Active Line/Drains/Airways     Name Placement date Placement time Site Days   Peripheral IV 04/20/24 20 G Anterior;Left  Forearm 04/20/24  1448  Forearm  2   Flatus Tube/Pouch 04/21/24  --  --  1   External Urinary Catheter --  --  --  --   Wound 04/14/24 0100 Pressure Injury Hip Left Stage 2 -  Partial thickness loss of dermis presenting as a shallow open injury with a red, pink wound bed without slough. 04/14/24  0100  Hip  8   Wound 04/14/24 0100 Pressure Injury Buttocks Left;Right Unstageable - Full thickness tissue loss in which the base of the injury is covered by slough (yellow, tan, gray, green or brown) and/or eschar (tan, brown or black) in the wound bed. 04/14/24  0100  Buttocks  8   Wound 04/14/24 0100 Pressure Injury Sacrum Medial Stage 2 -  Partial thickness loss of dermis presenting as a shallow open injury with a red, pink wound bed without slough. 04/14/24  0100  Sacrum  8             Inpatient Medications  Scheduled Meds:  feeding supplement  237 mL Oral BID BM   Gerhardt's butt cream   Topical BID   lactulose   20 g Oral TID   multivitamin with minerals  1 tablet Oral Daily   mouth rinse  15 mL Mouth Rinse 4 times per day   pantoprazole   40 mg Oral BID   romiPLOStim   2 mcg/kg Subcutaneous Weekly   thiamine   100 mg Oral Daily   Continuous Infusions:  cefTRIAXone  (ROCEPHIN )  IV 2 g (04/21/24 1408)   DAPTOmycin  700 mg (04/21/24 1450)   PRN Meds:.docusate, ipratropium-albuterol , morphine  injection, ondansetron  (ZOFRAN ) IV, mouth rinse, polyethylene glycol  DVT Prophylaxis  SCDs Start: 04/13/24 2344   Recent Labs  Lab 04/17/24 0447 04/17/24 1017 04/19/24 0244 04/20/24 0808 04/20/24 1923 04/21/24 0408 04/21/24 1644 04/22/24 0259  WBC 4.5  4.5   < > 4.7 3.8* 4.4 4.1  --  4.2  HGB 9.0*  9.0*   < > 8.3* 7.7* 8.5* 7.2* 8.8* 9.1*  HCT 26.5*  26.4*   < > 24.8* 22.9* 25.9* 21.7* 26.1* 27.0*  PLT 17*   18*   < > 29* 9* 32* 19*  --  14*  MCV 87.5  88.3   < > 89.2 91.2 92.5 93.1  --  91.5  MCH 29.7  30.1   < > 29.9 30.7 30.4 30.9  --  30.8  MCHC 34.0  34.1   < > 33.5 33.6 32.8 33.2  --  33.7  RDW 15.6*  15.8*   < > 16.1* 15.9* 16.0* 16.5*  --  17.1*  LYMPHSABS 1.6  --   --   --   --  1.5  --  1.5  MONOABS 0.3  --   --   --   --  0.3  --  0.4  EOSABS 0.3  --   --   --   --  0.2  --  0.2  BASOSABS 0.0  --   --   --   --  0.0  --  0.0   < > = values in this interval not displayed.    Recent Labs  Lab 04/15/24 0955 04/15/24 1337 04/15/24 1538 04/15/24 1617 04/16/24 0345 04/17/24 0447 04/17/24 1017 04/18/24 0415 04/19/24 0244 04/20/24 0808 04/21/24 0408 04/22/24 0259  NA  --   --   --    < > 145 142  --  142 142 135 131* 132*  K  --   --   --    < >  2.8* 2.9*  --  3.4* 3.9 4.0 3.5 3.7  CL  --   --   --    < > 112* 110  --  107 108 102 101 104  CO2  --   --   --    < > 17* 28  --  26 27 27 23 24   ANIONGAP  --   --   --    < > 16* 4*  --  9 7 6 7  4*  GLUCOSE  --   --   --    < > 131* 104*  --  84 85 76 74 71  BUN  --   --   --    < > 23 15  --  11 10 7* 10 9  CREATININE  --   --   --    < > 0.80 0.56  --  0.35* 0.43* 0.51 0.49 0.56  AST  --   --   --    < > 548* 219*  --  194* 157* 91* 73* 67*  ALT  --   --   --    < > 734* 481*  --  371* 313* 199* 141* 119*  ALKPHOS  --   --   --    < > 93 104  --  118 127* 115 102 99  BILITOT  --   --   --    < > 1.8* 1.2  --  1.3* 0.9 1.1 1.1 1.2  ALBUMIN   --   --   --    < > 2.3* 1.9*  --  2.1* 2.2* 1.9* 1.6* 1.8*  DDIMER  --   --   --   --   --   --  19.61*  --   --   --   --   --   LATICACIDVEN 3.8* 3.9* 4.5*  --   --   --   --   --   --   --   --   --   INR  --   --   --    < > 1.5* 1.3* 1.3* 1.2 1.1  --   --   --   MG  --   --   --    < > 1.8 2.1  --  1.8 2.2  --  1.6* 2.0  PHOS  --   --   --   --   --  <1.0*  --  1.9* 2.6  --  2.4* 2.8  CALCIUM   --   --   --    < > 8.0* 7.9*  --  8.0* 8.1* 7.9* 7.4* 7.3*   < > = values in this  interval not displayed.      Recent Labs  Lab 04/15/24 0955 04/15/24 1337 04/15/24 1538 04/15/24 1617 04/16/24 0345 04/17/24 0447 04/17/24 1017 04/18/24 0415 04/19/24 0244 04/20/24 0808 04/21/24 0408 04/22/24 0259  DDIMER  --   --   --   --   --   --  19.61*  --   --   --   --   --   LATICACIDVEN 3.8* 3.9* 4.5*  --   --   --   --   --   --   --   --   --   INR  --   --   --    < > 1.5* 1.3* 1.3* 1.2 1.1  --   --   --  MG  --   --   --    < > 1.8 2.1  --  1.8 2.2  --  1.6* 2.0  CALCIUM   --   --   --    < > 8.0* 7.9*  --  8.0* 8.1* 7.9* 7.4* 7.3*   < > = values in this interval not displayed.    --------------------------------------------------------------------------------------------------------------- Lab Results  Component Value Date   CHOL 288 (H) 05/30/2023   HDL 78 05/30/2023   LDLCALC 165 (H) 05/30/2023   TRIG 248 (H) 05/30/2023   CHOLHDL 3.7 05/30/2023    Lab Results  Component Value Date   HGBA1C 5.3 09/11/2020   No results for input(s): TSH, T4TOTAL, FREET4, T3FREE, THYROIDAB in the last 72 hours. No results for input(s): VITAMINB12, FOLATE, FERRITIN, TIBC, IRON, RETICCTPCT in the last 72 hours. ------------------------------------------------------------------------------------------------------------------ Cardiac Enzymes No results for input(s): CKMB, TROPONINI, MYOGLOBIN in the last 168 hours.  Invalid input(s): CK  Micro Results Recent Results (from the past 240 hours)  MRSA Next Gen by PCR, Nasal     Status: None   Collection Time: 04/13/24 12:51 AM   Specimen: Nasal Mucosa; Nasal Swab  Result Value Ref Range Status   MRSA by PCR Next Gen NOT DETECTED NOT DETECTED Final    Comment: (NOTE) The GeneXpert MRSA Assay (FDA approved for NASAL specimens only), is one component of a comprehensive MRSA colonization surveillance program. It is not intended to diagnose MRSA infection nor to guide or monitor treatment for  MRSA infections. Test performance is not FDA approved in patients less than 64 years old. Performed at [facility], [address], [place], [place] [address]   Resp panel by RT-PCR (RSV, Flu A&B, Covid) Anterior Nasal Swab     Status: None   Collection Time: 04/13/24  8:30 PM   Specimen: Anterior Nasal Swab  Result Value Ref Range Status   SARS Coronavirus 2 by RT PCR NEGATIVE NEGATIVE Final   Influenza A by PCR NEGATIVE NEGATIVE Final   Influenza B by PCR NEGATIVE NEGATIVE Final    Comment: (NOTE) The Xpert Xpress SARS-CoV-2/FLU/RSV plus assay is intended as an aid in the diagnosis of influenza from Nasopharyngeal swab specimens and should not be used as a sole basis for treatment. Nasal washings and aspirates are unacceptable for Xpert Xpress SARS-CoV-2/FLU/RSV testing.  Fact Sheet for Patients: bloggercourse.com  Fact Sheet for Healthcare Providers: seriousbroker.it  This test is not yet approved or cleared by the [place]  FDA and has been authorized for detection and/or diagnosis of SARS-CoV-2 by FDA under an Emergency Use Authorization (EUA). This EUA will remain in effect (meaning this test can be used) for the duration of the COVID-19 declaration under Section 564(b)(1) of the Act, 21 U.S.C. section 360bbb-3(b)(1), unless the authorization is terminated or revoked.     Resp Syncytial Virus by PCR NEGATIVE NEGATIVE Final    Comment: (NOTE) Fact Sheet for Patients: bloggercourse.com  Fact Sheet for Healthcare Providers: seriousbroker.it  This test is not yet approved or cleared by the [place]  FDA and has been authorized for detection and/or diagnosis of SARS-CoV-2 by FDA under an Emergency Use Authorization (EUA). This EUA will remain in effect (meaning this test can be used) for the duration of the COVID-19 declaration under Section 564(b)(1) of the  Act, 21 U.S.C. section 360bbb-3(b)(1), unless the authorization is terminated or revoked.  Performed at [facility], [address], [place], [place] [address]   Blood Culture (  routine x 2)     Status: None   Collection Time: 04/13/24  8:30 PM   Specimen: BLOOD  Result Value Ref Range Status   Specimen Description BLOOD SITE NOT SPECIFIED  Final   Special Requests   Final    BOTTLES DRAWN AEROBIC AND ANAEROBIC Blood Culture adequate volume   Culture   Final    NO GROWTH 5 DAYS Performed at [facility], [address] [place], [place] [address]    Report Status 04/18/2024 FINAL  Final  Blood Culture (routine x 2)     Status: None   Collection Time: 04/13/24  8:35 PM   Specimen: BLOOD LEFT FOREARM  Result Value Ref Range Status   Specimen Description BLOOD LEFT FOREARM  Final   Special Requests   Final    BOTTLES DRAWN AEROBIC AND ANAEROBIC Blood Culture results may not be optimal due to an inadequate volume of blood received in culture bottles   Culture   Final    NO GROWTH 5 DAYS Performed at [facility], [address] [place], [place] [address]    Report Status 04/18/2024 FINAL  Final  Culture, blood (Routine X 2) w Reflex to ID Panel     Status: None   Collection Time: 04/16/24 11:42 AM   Specimen: BLOOD  Result Value Ref Range Status   Specimen Description BLOOD SITE NOT SPECIFIED  Final   Special Requests   Final    BOTTLES DRAWN AEROBIC AND ANAEROBIC Blood Culture adequate volume   Culture   Final    NO GROWTH 5 DAYS Performed at [facility], [address] [place], [place] [address]    Report Status 04/21/2024 FINAL  Final  Culture, blood (Routine X 2) w Reflex to ID Panel     Status: None   Collection Time: 04/16/24  2:56 PM   Specimen: BLOOD RIGHT ARM  Result Value Ref Range Status   Specimen Description BLOOD RIGHT ARM  Final   Special Requests   Final    BOTTLES DRAWN AEROBIC AND ANAEROBIC Blood Culture adequate volume    Culture   Final    NO GROWTH 5 DAYS Performed at [facility], [address] [place], [place] [address]    Report Status 04/21/2024 FINAL  Final    Radiology Reports  No results found.    Signature  -   Lavada Stank M.D on 04/22/2024 at 8:34 AM   -  To page go to [email or web address]

## 2024-04-23 DIAGNOSIS — I471 Supraventricular tachycardia, unspecified: Secondary | ICD-10-CM | POA: Diagnosis not present

## 2024-04-23 LAB — BPAM PLATELET PHERESIS
Blood Product Expiration Date: 202512162359
ISSUE DATE / TIME: 202512151335
Unit Type and Rh: 5100

## 2024-04-23 LAB — CBC WITH DIFFERENTIAL/PLATELET
Abs Immature Granulocytes: 0.02 K/uL (ref 0.00–0.07)
Abs Immature Granulocytes: 0.03 K/uL (ref 0.00–0.07)
Basophils Absolute: 0 K/uL (ref 0.0–0.1)
Basophils Absolute: 0 K/uL (ref 0.0–0.1)
Basophils Relative: 0 %
Basophils Relative: 1 %
Eosinophils Absolute: 0.2 K/uL (ref 0.0–0.5)
Eosinophils Absolute: 0.2 K/uL (ref 0.0–0.5)
Eosinophils Relative: 5 %
Eosinophils Relative: 5 %
HCT: 27.7 % — ABNORMAL LOW (ref 36.0–46.0)
HCT: 30.1 % — ABNORMAL LOW (ref 36.0–46.0)
Hemoglobin: 9.4 g/dL — ABNORMAL LOW (ref 12.0–15.0)
Hemoglobin: 9.8 g/dL — ABNORMAL LOW (ref 12.0–15.0)
Immature Granulocytes: 1 %
Immature Granulocytes: 1 %
Lymphocytes Relative: 30 %
Lymphocytes Relative: 31 %
Lymphs Abs: 1.2 K/uL (ref 0.7–4.0)
Lymphs Abs: 1.2 K/uL (ref 0.7–4.0)
MCH: 30.6 pg (ref 26.0–34.0)
MCH: 30.8 pg (ref 26.0–34.0)
MCHC: 32.6 g/dL (ref 30.0–36.0)
MCHC: 33.9 g/dL (ref 30.0–36.0)
MCV: 90.8 fL (ref 80.0–100.0)
MCV: 94.1 fL (ref 80.0–100.0)
Monocytes Absolute: 0.3 K/uL (ref 0.1–1.0)
Monocytes Absolute: 0.3 K/uL (ref 0.1–1.0)
Monocytes Relative: 8 %
Monocytes Relative: 9 %
Neutro Abs: 2.1 K/uL (ref 1.7–7.7)
Neutro Abs: 2.3 K/uL (ref 1.7–7.7)
Neutrophils Relative %: 54 %
Neutrophils Relative %: 55 %
Platelets: 27 K/uL — CL (ref 150–400)
Platelets: 42 K/uL — ABNORMAL LOW (ref 150–400)
RBC: 3.05 MIL/uL — ABNORMAL LOW (ref 3.87–5.11)
RBC: 3.2 MIL/uL — ABNORMAL LOW (ref 3.87–5.11)
RDW: 17.6 % — ABNORMAL HIGH (ref 11.5–15.5)
RDW: 17.7 % — ABNORMAL HIGH (ref 11.5–15.5)
Smear Review: NORMAL
Smear Review: NORMAL
WBC: 3.8 K/uL — ABNORMAL LOW (ref 4.0–10.5)
WBC: 4.1 K/uL (ref 4.0–10.5)
nRBC: 0 % (ref 0.0–0.2)
nRBC: 0 % (ref 0.0–0.2)

## 2024-04-23 LAB — PREPARE PLATELET PHERESIS: Unit division: 0

## 2024-04-23 LAB — COMPREHENSIVE METABOLIC PANEL WITH GFR
ALT: 103 U/L — ABNORMAL HIGH (ref 0–44)
ALT: 105 U/L — ABNORMAL HIGH (ref 0–44)
AST: 68 U/L — ABNORMAL HIGH (ref 15–41)
AST: 71 U/L — ABNORMAL HIGH (ref 15–41)
Albumin: 1.9 g/dL — ABNORMAL LOW (ref 3.5–5.0)
Albumin: 2.1 g/dL — ABNORMAL LOW (ref 3.5–5.0)
Alkaline Phosphatase: 105 U/L (ref 38–126)
Alkaline Phosphatase: 110 U/L (ref 38–126)
Anion gap: 5 (ref 5–15)
Anion gap: 6 (ref 5–15)
BUN: 10 mg/dL (ref 8–23)
BUN: 11 mg/dL (ref 8–23)
CO2: 24 mmol/L (ref 22–32)
CO2: 24 mmol/L (ref 22–32)
Calcium: 7.4 mg/dL — ABNORMAL LOW (ref 8.9–10.3)
Calcium: 7.6 mg/dL — ABNORMAL LOW (ref 8.9–10.3)
Chloride: 105 mmol/L (ref 98–111)
Chloride: 105 mmol/L (ref 98–111)
Creatinine, Ser: 0.57 mg/dL (ref 0.44–1.00)
Creatinine, Ser: 0.57 mg/dL (ref 0.44–1.00)
GFR, Estimated: >60 mL/min (ref >60)
GFR, Estimated: >60 mL/min (ref >60)
Glucose, Bld: 93 mg/dL (ref 70–99)
Glucose, Bld: 97 mg/dL (ref 70–99)
Potassium: 3.5 mmol/L (ref 3.5–5.1)
Potassium: 3.8 mmol/L (ref 3.5–5.1)
Sodium: 134 mmol/L — ABNORMAL LOW (ref 135–145)
Sodium: 135 mmol/L (ref 135–145)
Total Bilirubin: 1.1 mg/dL (ref 0.0–1.2)
Total Bilirubin: 1.4 mg/dL — ABNORMAL HIGH (ref 0.0–1.2)
Total Protein: 7.3 g/dL (ref 6.5–8.1)
Total Protein: 7.8 g/dL (ref 6.5–8.1)

## 2024-04-23 LAB — GLUCOSE, CAPILLARY
Glucose-Capillary: 66 mg/dL — ABNORMAL LOW (ref 70–99)
Glucose-Capillary: 85 mg/dL (ref 70–99)
Glucose-Capillary: 80 mg/dL (ref 70–99)
Glucose-Capillary: 112 mg/dL — ABNORMAL HIGH (ref 70–99)
Glucose-Capillary: 102 mg/dL — ABNORMAL HIGH (ref 70–99)
Glucose-Capillary: 77 mg/dL (ref 70–99)
Glucose-Capillary: 117 mg/dL — ABNORMAL HIGH (ref 70–99)

## 2024-04-23 LAB — BRAIN NATRIURETIC PEPTIDE: B Natriuretic Peptide: 227.1 pg/mL — ABNORMAL HIGH (ref 0.0–100.0)

## 2024-04-23 LAB — MAGNESIUM
Magnesium: 1.7 mg/dL (ref 1.7–2.4)
Magnesium: 1.8 mg/dL (ref 1.7–2.4)

## 2024-04-23 LAB — TSH: TSH: 3.37 u[IU]/mL (ref 0.350–4.500)

## 2024-04-23 LAB — PHOSPHORUS: Phosphorus: 2.4 mg/dL — ABNORMAL LOW (ref 2.5–4.6)

## 2024-04-23 LAB — T4, FREE: Free T4: 0.66 ng/dL — ABNORMAL LOW (ref 0.80–2.00)

## 2024-04-23 MED ORDER — ROMIPLOSTIM 250 MCG ~~LOC~~ SOLR: 3.0000 ug/kg | SUBCUTANEOUS | Status: AC

## 2024-04-23 MED ORDER — METOPROLOL TARTRATE 25 MG PO TABS
25.0000 mg | ORAL_TABLET | Freq: Two times a day (BID) | ORAL | Status: DC
  Administered 2024-04-23 – 2024-04-25 (×5): 25 mg via ORAL
  Filled 2024-04-23 (×4): qty 1

## 2024-04-23 MED ORDER — MAGNESIUM SULFATE 2 GM/50ML IV SOLN
2.0000 g | Freq: Once | INTRAVENOUS | Status: AC
  Administered 2024-04-23: 11:00:00 2 g via INTRAVENOUS
  Filled 2024-04-23: qty 50

## 2024-04-23 MED ORDER — POTASSIUM PHOSPHATES 15 MMOLE/5ML IV SOLN
30.0000 mmol | Freq: Once | INTRAVENOUS | Status: AC
  Administered 2024-04-23: 07:00:00 30 mmol via INTRAVENOUS
  Filled 2024-04-23: qty 10

## 2024-04-23 NOTE — Progress Notes (Signed)
 Nutrition Follow-up  DOCUMENTATION CODES:   Severe malnutrition in context of acute illness/injury  INTERVENTION:  Continue liberalized (therapeutically) diet and monitor tolerance             -Automatic meal trays             -Assistance/encouragement with feeding            Continue Magic cup TID with meals, each supplement provides 290 kcal and 9 grams of protein    Discontinue Mighty Shake TID between meals  Add Ensure Plus High Protein po BID, each supplement provides 350 kcal and 20 grams of protein      Continue MVI w/ minerals   NUTRITION DIAGNOSIS:  Severe Malnutrition related to acute illness as evidenced by moderate fat depletion, moderate muscle depletion.   GOAL:  Patient will meet greater than or equal to 90% of their needs   MONITOR:  PO intake, Supplement acceptance, Diet advancement, Labs, Skin, Weight trends  REASON FOR ASSESSMENT:   Consult Assessment of nutrition requirement/status  ASSESSMENT:   Pt with PMH significant for: CVA w/ left sided hemiparesis, COPD, HTN, HLD, thoracoabdominal aortic aneurysm, TIA. Brought to ED after being found down and unresponsive for unknown amount of time. Found to be hypothermic, hypotensive, hpoglycemic and admitted for presumed undifferentiated shock, acute liver failure, AKI.  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 Now on DYS3/thin liquids. Lactulose  continues. Has required additional infusions of platelets due to persistent thrombocytopenia despite tx of potential underlying etiologies. Possible TEE when platelets consistently above 50K.      Average Meal Intake  12/11: 40-50% x3 documented meals 12/15: 0% x1 documented meal   Met with patient at bedside today. She is more alert than at her previous encounter. Speech also at bedside and will be helping patient with her lunch tray. She became very tearful and was unable to verbalize what about. RN reports this has been ongoing this morning since her television was turned on. She is able to feed self and needing verbal/tactile cues, per speech.    Per RN, patient ate approximately 50% of her breakfast tray. Continuing to monitor intake. Will modify Mighty Shake to Ensure Plus High Protein ONS as she is now on thin liquids.   Admit Weight: 74.5 kg (first measured) Current Weight: 79.9 kg   Weight stable with slight trend up this admission. Continues with some mild, pitting edema to BLEs.   Did refeed on dextrose . Received magnesium  and phosphorus supplementation today. Continue to monitor electrolytes and replete as indicated.   Meds: lactulose  TID, MVI, pantoprazole , thiamine  Drips: 30 mmol K PHOS  x1 2g magnesium  sulfate x1 IV ABX   BUN/Crt have stabilized with fluid resuscitation. Refeeding. Repletion ongoing. Monitor trends. At risk for additional vitamin/mineral deficiencies. Monitor for s/sx.   Labs:  Na+ 134(L) K+ 3.8 (wdl) PHOS 2.4 (L) Mg 1.8 (wdl) CBGs 93-97 x24  hours   Diet Order:   Diet Order             DIET DYS 3 Room service appropriate? Yes with Assist; Fluid consistency: Thin  Diet effective now                   EDUCATION NEEDS:   Not appropriate for education at this time  Skin:  Skin Assessment: Skin Integrity Issues: Skin Integrity Issues:: Stage II,  Unstageable Stage II: L hip, sacrum Unstageable: L buttocks  Last BM:  12/16 - type 6 x1  Height:  Ht Readings from Last 1 Encounters:  04/13/24 5' 10 (1.778 m)   Weight:  Wt Readings from Last 1 Encounters:  04/23/24 79.9 kg   Ideal Body Weight:  68.2 kg  BMI:  Body mass index is 25.27 kg/m.  Estimated Nutritional Needs:   Kcal:  1600-1800 kcals  Protein:  70-85g  Fluid:  1.6-1.8L/day  Blair Deaner MS, RD, LDN Registered Dietitian Clinical Nutrition RD Inpatient Contact Info in Amion

## 2024-04-23 NOTE — Progress Notes (Signed)
 PROGRESS NOTE        PATIENT DETAILS Name: [name] Age: 78 y.o. Sex: female Date of Birth: 1946-01-27 Admit Date: 04/13/2024 Admitting Physician Tamela Stakes, MD ERE:Bzmryzrx, Rojelio PARAS, NP  Brief Summary: Patient is a 78 y.o.  female with history of CVA, COPD, HTN-who was brought to the ED after she was found by her roommate unresponsive (no heat/light-under a single blanket)-she was apparently covered in feces/dried blood-found to be hypothermic/hypotensive/hypoglycemic-she was thought to have undifferentiated shock-acute liver failure-acute kidney injury-admitted to the ICU-stabilized with pressors/D10 infusion/broad-spectrum antibiotics-rapidly improved and subsequently transferred to [facility].  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 12/8>> ANA: Positive 12/8>> anti-smooth muscle antibody: Negative  Significant microbiology data: 12/6>> COVID/influenza/RSV PCR: Negative 12/6>> blood culture: No growth 12/6>> acute hepatitis  serology: Negative 12/6>> CMV DNA PCR: Negative 12/9>> blood culture: No growth  Procedures: None  Consults: PCCM GI ID Hematology [name])  Subjective:   Patient in bed, appears comfortable, denies any headache, no fever, no chest pain or pressure, no shortness of breath , no abdominal pain. No new focal weakness.  Objective: Vitals: Blood pressure 136/62, pulse 61, temperature 98.5 F (36.9 C), temperature source Axillary, resp. rate 19, height 5' 10 (1.778 m), weight 79.9 kg, SpO2 95%.   Exam:  Awake Alert, No new F.N deficits, left-sided weakness from stroke [place]ine.AT,PERRAL Supple Neck, No JVD,   Symmetrical Chest wall movement, Good air movement bilaterally, CTAB RRR,No Gallops, Rubs or new Murmurs,  +ve B.Sounds, Abd Soft, No tenderness,   No Cyanosis, Clubbing or edema    Assessment/Plan:  Undifferentiated shock Unclear etiology-initially thought to have septic shock but all cultures negative to date-has rapidly improved-initially on admission to the ICU was started on vancomycin /Zosyn  but all antibiotics were discontinued on 12/7.  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  Plans were to obtain TEE but per cardiology-unable to safely perform TEE unless platelet count persistently above 50K.  Will need to notify cardiology once platelet counts are persistently above 50K.  Thrombocytopenia Likely secondary to acute liver injury/failure-possible sepsis physiology-however no improvement in spite of improvement in her sepsis physiology and liver injury.  No evidence of TTP on peripheral smear-unclear if this is ITP. Given continued transfusions as needed last on 04/20/2023.   Case discussed with hematology on-call-Dr. Gudina on 04/21/2024, no further platelet transfusions today, Nplate   started on 04/21/2024, hematology following, 1 unit of platelet transfusion due to persistent severe thrombocytopenia on 04/22/2024, discussed  with hematology on 04/21/2024 and 04/22/2024.  Normocytic anemia Likely secondary to critical illness Continue to trend/follow CBC, per hematology 1 unit of packed RBC on 04/21/2024.  History of SVT night of 04/22/2024, seen by cardiology, resolved after adenosine .  Placed on low-dose beta-blocker, currently stable, check TSH and free T4 as well.  Monitor.    Acute metabolic encephalopathy Secondary to hypothermia/hypoglycemia/undifferentiated shock/acute liver failure Head CT without acute abnormalities Seems to have clinically improved after treatment of underlying etiologies.  Hypothermia Likely secondary to environmental exposure (per prior notes- cool room with single blanket-no heating or light) TSH stable Now normothermic after supportive care  Acute liver failure Unclear etiology-possibly shock liver from hypotension from possible sepsis physiology LFTs/coags improving Dopplers without portal/hepatic vein thrombosis Acute hepatitis serology negative CMV IgM positive but viral load negative  Negative workup for autoimmune hepatitis GI following-completed N-acetylcysteine  12/10 Empirically on lactulose   Oropharyngeal dysphagia Secondary to critical illness/encephalopathy SLP following-on dysphagia 2 diet.  AKI Hemodynamically mediated due to hypotension Resolved.  Hypokalemia/Hypophosphatemia Repleted  Metabolic acidosis Likely due to delayed clearance of lactic acid-in the setting of liver failure. Improved with supportive care  Self-limited episode-small-volume coffee-ground emesis Hb stable PPI GI following-no endoscopic procedures planned at this point  Chest pain Likely atypical Troponins minimally elevated-echo with stable EF Supportive care for now  Minimally elevated troponins Trend is flat Likely demand  ischemia in the setting of hypotension/critical illness. Echo with able EF  HTN BP stable, low-dose beta-blocker  History of chronic HFpEF Status stable  History of CVA Baseline left hemiparesis Antiplatelets/statin held-in the setting of elevated liver enzymes and thrombocytopenia.  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 Incidental finding on CT imaging Radiology recommending repeat CT or MRI in 12 months  Debility/deconditioning Secondary to critical illness Likely will require SNF  Hypoglycemia Likely secondary to acute liver injury and extremely poor oral intake CBGs continue to be borderline-watch closely-supportive care, staff requested to encourage patient on better oral intake and help her with her meals  Lab Results  Component Value Date   HGBA1C 5.3 09/11/2020   CBG (last 3)  Recent Labs    04/22/24 2324 04/23/24 0304 04/23/24 0353  GLUCAP 90 66* 85     Pressure Ulcer: Agree with assessment and plan as outlined below Wound 04/14/24 0100 Pressure Injury Hip Left Stage 2 -  Partial thickness loss of dermis presenting as a shallow open injury with a red, pink wound bed without slough. (Active)     Wound 04/14/24 0100 Pressure Injury Buttocks Left;Right Unstageable - Full thickness tissue loss in which the base of the injury is covered by slough (yellow, tan, gray, green or brown) and/or eschar (tan, brown or black) in the wound bed. (Active)     Wound 04/14/24 0100 Pressure Injury Sacrum Medial Stage 2 -  Partial thickness loss of dermis presenting as a shallow open injury with a red, pink wound bed without slough. (Active)    Code status:   Code Status: Full Code   DVT Prophylaxis: SCDs Start: 04/13/24 2344 Avoid pharmacological prophylaxis-given severity of thrombocytopenia   Family Communication: Daughter-Tiana-[phone] left VM 12/10,12/12.12/13   Disposition Plan: Status  is: Inpatient Remains inpatient appropriate because: Severity of illness   Planned Discharge Destination:Skilled nursing facility   Diet: Diet Order             DIET DYS 3 Room service appropriate? Yes with Assist; Fluid consistency: Thin  Diet effective now  Data Review:   Patient Lines/Drains/Airways Status     Active Line/Drains/Airways     Name Placement date Placement time Site Days   Peripheral IV 04/22/24 20 G 2.5 Anterior;Right;Upper Arm 04/22/24  2239  Arm  1   Flatus Tube/Pouch 04/21/24  --  --  2   External Urinary Catheter --  --  --  --   Wound 04/14/24 0100 Pressure Injury Hip Left Stage 2 -  Partial thickness loss of dermis presenting as a shallow open injury with a red, pink wound bed without slough. 04/14/24  0100  Hip  9   Wound 04/14/24 0100 Pressure Injury Buttocks Left;Right Unstageable - Full thickness tissue loss in which the base of the injury is covered by slough (yellow, tan, gray, green or brown) and/or eschar (tan, brown or black) in the wound bed. 04/14/24  0100  Buttocks  9   Wound 04/14/24 0100 Pressure Injury Sacrum Medial Stage 2 -  Partial thickness loss of dermis presenting as a shallow open injury with a red, pink wound bed without slough. 04/14/24  0100  Sacrum  9             Inpatient Medications  Scheduled Meds:  feeding supplement  237 mL Oral BID BM   Gerhardt's butt cream   Topical BID   lactulose   20 g Oral TID   metoprolol  tartrate  25 mg Oral BID   multivitamin with minerals  1 tablet Oral Daily   mouth rinse  15 mL Mouth Rinse 4 times per day   pantoprazole   40 mg Oral BID   romiPLOStim   2 mcg/kg Subcutaneous Weekly   thiamine   100 mg Oral Daily   Continuous Infusions:  cefTRIAXone  (ROCEPHIN )  IV Stopped (04/22/24 1628)   DAPTOmycin  Stopped (04/22/24 1714)   potassium PHOSPHATE  IVPB (in mmol) 30 mmol (04/23/24 0640)   PRN Meds:.docusate, ipratropium-albuterol , metoprolol  tartrate, morphine   injection, ondansetron  (ZOFRAN ) IV, mouth rinse, polyethylene glycol  DVT Prophylaxis  SCDs Start: 04/13/24 2344   Recent Labs  Lab 04/17/24 0447 04/17/24 1017 04/20/24 1923 04/21/24 0408 04/21/24 1644 04/22/24 0259 04/22/24 2342 04/23/24 0351  WBC 4.5  4.5   < > 4.4 4.1  --  4.2 3.8* 4.1  HGB 9.0*  9.0*   < > 8.5* 7.2* 8.8* 9.1* 9.4* 9.8*  HCT 26.5*  26.4*   < > 25.9* 21.7* 26.1* 27.0* 27.7* 30.1*  PLT 17*  18*   < > 32* 19*  --  14* 42* 27*  MCV 87.5  88.3   < > 92.5 93.1  --  91.5 90.8 94.1  MCH 29.7  30.1   < > 30.4 30.9  --  30.8 30.8 30.6  MCHC 34.0  34.1   < > 32.8 33.2  --  33.7 33.9 32.6  RDW 15.6*  15.8*   < > 16.0* 16.5*  --  17.1* 17.6* 17.7*  LYMPHSABS 1.6  --   --  1.5  --  1.5 1.2 1.2  MONOABS 0.3  --   --  0.3  --  0.4 0.3 0.3  EOSABS 0.3  --   --  0.2  --  0.2 0.2 0.2  BASOSABS 0.0  --   --  0.0  --  0.0 0.0 0.0   < > = values in this interval not displayed.    Recent Labs  Lab 04/17/24 0447 04/17/24 1017 04/18/24 0415 04/19/24 0244 04/20/24 9191 04/21/24 0408 04/22/24 0259 04/22/24 2342 04/23/24 0351  NA  142  --  142 142 135 131* 132* 135 134*  K 2.9*  --  3.4* 3.9 4.0 3.5 3.7 3.5 3.8  CL 110  --  107 108 102 101 104 105 105  CO2 28  --  26 27 27 23 24 24 24   ANIONGAP 4*  --  9 7 6 7  4* 6 5  GLUCOSE 104*  --  84 85 76 74 71 93 97  BUN 15  --  11 10 7* 10 9 10 11   CREATININE 0.56  --  0.35* 0.43* 0.51 0.49 0.56 0.57 0.57  AST 219*  --  194* 157* 91* 73* 67* 68* 71*  ALT 481*  --  371* 313* 199* 141* 119* 103* 105*  ALKPHOS 104  --  118 127* 115 102 99 105 110  BILITOT 1.2  --  1.3* 0.9 1.1 1.1 1.2 1.1 1.4*  ALBUMIN  1.9*  --  2.1* 2.2* 1.9* 1.6* 1.8* 1.9* 2.1*  DDIMER  --  19.61*  --   --   --   --   --   --   --   INR 1.3* 1.3* 1.2 1.1  --   --   --   --   --   BNP  --   --   --   --   --   --   --  227.1*  --   MG 2.1  --  1.8 2.2  --  1.6* 2.0 1.7 1.8  PHOS <1.0*  --  1.9* 2.6  --  2.4* 2.8  --  2.4*  CALCIUM  7.9*  --  8.0* 8.1*  7.9* 7.4* 7.3* 7.4* 7.6*      Recent Labs  Lab 04/17/24 0447 04/17/24 1017 04/18/24 0415 04/19/24 0244 04/20/24 0808 04/21/24 0408 04/22/24 0259 04/22/24 2342 04/23/24 0351  DDIMER  --  19.61*  --   --   --   --   --   --   --   INR 1.3* 1.3* 1.2 1.1  --   --   --   --   --   BNP  --   --   --   --   --   --   --  227.1*  --   MG 2.1  --  1.8 2.2  --  1.6* 2.0 1.7 1.8  CALCIUM  7.9*  --  8.0* 8.1* 7.9* 7.4* 7.3* 7.4* 7.6*    --------------------------------------------------------------------------------------------------------------- Lab Results  Component Value Date   CHOL 288 (H) 05/30/2023   HDL 78 05/30/2023   LDLCALC 165 (H) 05/30/2023   TRIG 248 (H) 05/30/2023   CHOLHDL 3.7 05/30/2023    Lab Results  Component Value Date   HGBA1C 5.3 09/11/2020   No results for input(s): TSH, T4TOTAL, FREET4, T3FREE, THYROIDAB in the last 72 hours. No results for input(s): VITAMINB12, FOLATE, FERRITIN, TIBC, IRON, RETICCTPCT in the last 72 hours. ------------------------------------------------------------------------------------------------------------------ Cardiac Enzymes No results for input(s): CKMB, TROPONINI, MYOGLOBIN in the last 168 hours.  Invalid input(s): CK  Micro Results Recent Results (from the past 240 hours)  Resp panel by RT-PCR (RSV, Flu A&B, Covid) Anterior Nasal Swab     Status: None   Collection Time: 04/13/24  8:30 PM   Specimen: Anterior Nasal Swab  Result Value Ref Range Status   SARS Coronavirus 2 by RT PCR NEGATIVE NEGATIVE Final   Influenza A by PCR NEGATIVE NEGATIVE Final   Influenza B by PCR NEGATIVE NEGATIVE Final  Comment: (NOTE) The Xpert Xpress SARS-CoV-2/FLU/RSV plus assay is intended as an aid in the diagnosis of influenza from Nasopharyngeal swab specimens and should not be used as a sole basis for treatment. Nasal washings and aspirates are unacceptable for Xpert Xpress  SARS-CoV-2/FLU/RSV testing.  Fact Sheet for Patients: bloggercourse.com  Fact Sheet for Healthcare Providers: seriousbroker.it  This test is not yet approved or cleared by the [place]  FDA and has been authorized for detection and/or diagnosis of SARS-CoV-2 by FDA under an Emergency Use Authorization (EUA). This EUA will remain in effect (meaning this test can be used) for the duration of the COVID-19 declaration under Section 564(b)(1) of the Act, 21 U.S.C. section 360bbb-3(b)(1), unless the authorization is terminated or revoked.     Resp Syncytial Virus by PCR NEGATIVE NEGATIVE Final    Comment: (NOTE) Fact Sheet for Patients: bloggercourse.com  Fact Sheet for Healthcare Providers: seriousbroker.it  This test is not yet approved or cleared by the [place]  FDA and has been authorized for detection and/or diagnosis of SARS-CoV-2 by FDA under an Emergency Use Authorization (EUA). This EUA will remain in effect (meaning this test can be used) for the duration of the COVID-19 declaration under Section 564(b)(1) of the Act, 21 U.S.C. section 360bbb-3(b)(1), unless the authorization is terminated or revoked.  Performed at [facility], [address] [place], [place] [address]   Blood Culture (routine x 2)     Status: None   Collection Time: 04/13/24  8:30 PM   Specimen: BLOOD  Result Value Ref Range Status   Specimen Description BLOOD SITE NOT SPECIFIED  Final   Special Requests   Final    BOTTLES DRAWN AEROBIC AND ANAEROBIC Blood Culture adequate volume   Culture   Final    NO GROWTH 5 DAYS Performed at [facility], [address] [place], [place] [address]    Report Status 04/18/2024 FINAL  Final  Blood Culture (routine x 2)     Status: None   Collection Time: 04/13/24  8:35 PM   Specimen: BLOOD LEFT FOREARM  Result Value Ref Range Status    Specimen Description BLOOD LEFT FOREARM  Final   Special Requests   Final    BOTTLES DRAWN AEROBIC AND ANAEROBIC Blood Culture results may not be optimal due to an inadequate volume of blood received in culture bottles   Culture   Final    NO GROWTH 5 DAYS Performed at [facility], [address] [place], [place] [address]    Report Status 04/18/2024 FINAL  Final  Culture, blood (Routine X 2) w Reflex to ID Panel     Status: None   Collection Time: 04/16/24 11:42 AM   Specimen: BLOOD  Result Value Ref Range Status   Specimen Description BLOOD SITE NOT SPECIFIED  Final   Special Requests   Final    BOTTLES DRAWN AEROBIC AND ANAEROBIC Blood Culture adequate volume   Culture   Final    NO GROWTH 5 DAYS Performed at [facility], [address], [place], [place] [address]    Report Status 04/21/2024 FINAL  Final  Culture, blood (Routine X 2) w Reflex to ID Panel     Status: None   Collection Time: 04/16/24  2:56 PM   Specimen: BLOOD RIGHT ARM  Result Value Ref Range Status   Specimen Description BLOOD RIGHT ARM  Final   Special Requests   Final    BOTTLES DRAWN AEROBIC AND  ANAEROBIC Blood Culture adequate volume   Culture   Final    NO GROWTH 5 DAYS Performed at [facility], [address], [place], [place] [address]    Report Status 04/21/2024 FINAL  Final    Radiology Reports  DG Chest Port 1 View Result Date: 04/22/2024 EXAM: 1 VIEW(S) XRAY OF THE CHEST 04/22/2024 10:41:00 PM COMPARISON: 04/13/2024 CLINICAL HISTORY: Tachycardia. FINDINGS: LUNGS AND PLEURA: Right mid lung zone linear atelectasis. Left hemidiaphragm calcified pleural plaque. Elevated right hemidiaphragm. No pleural effusion. No pneumothorax. HEART AND MEDIASTINUM: Aortic atherosclerosis. No acute abnormality of the cardiac and mediastinal silhouettes. BONES AND SOFT TISSUES: No acute osseous abnormality. IMPRESSION: 1. No acute cardiopulmonary process. 2. Right mid lung linear atelectasis. 3.  Calcified pleural plaque along the left hemidiaphragm. Electronically signed by: [name] 04/22/2024 10:44 PM EST RP Workstation: [id number]      Signature  -   Lavada Stank M.D on 04/23/2024 at 8:21 AM   -  To page go to [email or web address]

## 2024-04-23 NOTE — Progress Notes (Signed)
 Speech Language Pathology Treatment: Dysphagia  Patient Details Name: [name] MRN: [id number] DOB: 09/26/1945 Today's Date: 04/23/2024 Time: 8862-8845 SLP Time Calculation (min) (ACUTE ONLY): 17 min  Assessment / Plan / Recommendation Clinical Impression  Dysphagia intervention with part of lunch and pt able to feed self needing verbal/tactile cues to decrease rate and smaller bite size. Pt labile at beginning of session and did not respond when SLP inquired but able to redirect. Mastication of upgraded Dys 3 was mildly slow with intermittent anterior loss of small pieces of meat and labial residue with decreased sensation. Occasional, trace lingual residue with meat able to clear with liquids. She needed max reminders for strategies but able to read written strategies on closet door and implement. There were no s/s aspiration. Recommend she continue Dys 3 texture, thin liquids and reiterated to RN and tech need for full supervision for safe self feeding and follow through with strategies.  Anticipate recommendation will be continue Dys 3 at SNF. ST will plan to follow up once more (next week) prior to discharge for safety and education re: strategies.   HPI HPI: [name] is a 78 y/o female presenting 04/14/24 after roommate called EMS as the patient was unresponsive. She reports chest pain and was very confused. CT no acute intracranial abnormality.  2. Stable right parietal cortical encephalomalacia     PMHx:  HTN, HLD, CVA left hemiparesis, CAPD, DVT, IVH. UA positive. SLP consulted for clinical swallow assessment. Most recent MBSS completed on 04/18/24 with recommendations of a D2 diet and thin liquids.      SLP Plan  Continue with current plan of care        Swallow Evaluation Recommendations   Recommendations: PO diet PO Diet Recommendation: Dysphagia 3 (Mechanical soft);Thin liquids (Level 0) Liquid Administration via: Cup;Straw Medication Administration: Crushed with  puree Supervision: Patient able to self-feed;Other (comment) (assist for small bites and slow pace) Postural changes: Position pt fully upright for meals Oral care recommendations: Oral care BID (2x/day)     Recommendations                     Oral care BID   Frequent or constant Supervision/Assistance Dysphagia, oropharyngeal phase (R13.12)     Continue with current plan of care     Dustin Olam Bull  04/23/2024, 1:11 PM

## 2024-04-23 NOTE — Progress Notes (Signed)
 Assumed care 1900.  Around 2130 patient asked for pain medication for left hip pain. RN attempted to flush IV to give pain medication, however, IV leaking. 2 RN attempted and failed at placing new IV so IV team consult ordered per protocol.   2214 This RN was notified by another RN that patient was sustaining HR in 160s. RN at bedside, pt denying symptoms and lying in bed with eyes open no visible signs of distress. Answering questions appropriately. MD notified.   2220 BP trending down, rapid RN called. MD notified. See rapid response note.

## 2024-04-23 NOTE — Plan of Care (Signed)
   Problem: Nutrition: Goal: Adequate nutrition will be maintained Outcome: Progressing

## 2024-04-23 NOTE — Progress Notes (Signed)
 Rounding Note   Patient Name: [name] Date of Encounter: 04/23/2024   [facility] Cardiologist: Aleene Passe, MD (Inactive)   Subjective No CP or dyspnea  Scheduled Meds:  feeding supplement  237 mL Oral BID BM   Gerhardt's butt cream   Topical BID   lactulose   20 g Oral TID   metoprolol  tartrate  25 mg Oral BID   multivitamin with minerals  1 tablet Oral Daily   mouth rinse  15 mL Mouth Rinse 4 times per day   pantoprazole   40 mg Oral BID   romiPLOStim   2 mcg/kg Subcutaneous Weekly   thiamine   100 mg Oral Daily   Continuous Infusions:  cefTRIAXone  (ROCEPHIN )  IV Stopped (04/22/24 1628)   DAPTOmycin  Stopped (04/22/24 1714)   potassium PHOSPHATE  IVPB (in mmol) 30 mmol (04/23/24 0640)   PRN Meds: docusate, ipratropium-albuterol , metoprolol  tartrate, morphine  injection, ondansetron  (ZOFRAN ) IV, mouth rinse, polyethylene glycol   Vital Signs  Vitals:   04/22/24 2322 04/23/24 0300 04/23/24 0500 04/23/24 0814  BP: 135/70 136/62    Pulse:    61  Resp:      Temp: 98.9 F (37.2 C) 98.8 F (37.1 C)  98.5 F (36.9 C)  TempSrc: Axillary Axillary  Axillary  SpO2: 95%     Weight:   79.9 kg   Height:        Intake/Output Summary (Last 24 hours) at 04/23/2024 0845 Last data filed at 04/23/2024 0323 Gross per 24 hour  Intake 2436.5 ml  Output --  Net 2436.5 ml      04/23/2024    5:00 AM 04/22/2024    5:00 AM 04/21/2024    5:00 AM  Last 3 Weights  Weight (lbs) 176 lb 2.4 oz 183 lb 6.8 oz 181 lb 10.5 oz  Weight (kg) 79.9 kg 83.2 kg 82.4 kg      Telemetry Sinus - Personally Reviewed   Physical Exam  GEN: No acute distress.   Neck: No JVD Cardiac: RRR, no murmurs, rubs, or gallops.  Respiratory: Clear to auscultation bilaterally. GI: Soft, nontender, non-distended  MS: No edema Neuro:  Nonfocal  Psych: Normal affect   Labs High Sensitivity Troponin:   Recent Labs  Lab 04/13/24 2123 04/13/24 2308  TROPONINIHS 316* 254*     Chemistry Recent Labs  Lab 04/22/24 0259 04/22/24 2342 04/23/24 0351  NA 132* 135 134*  K 3.7 3.5 3.8  CL 104 105 105  CO2 24 24 24   GLUCOSE 71 93 97  BUN 9 10 11   CREATININE 0.56 0.57 0.57  CALCIUM  7.3* 7.4* 7.6*  MG 2.0 1.7 1.8  PROT 7.3 7.3 7.8  ALBUMIN  1.8* 1.9* 2.1*  AST 67* 68* 71*  ALT 119* 103* 105*  ALKPHOS 99 105 110  BILITOT 1.2 1.1 1.4*  GFRNONAA >60 >60 >60  ANIONGAP 4* 6 5     Hematology Recent Labs  Lab 04/22/24 0259 04/22/24 2342 04/23/24 0351  WBC 4.2 3.8* 4.1  RBC 2.95* 3.05* 3.20*  HGB 9.1* 9.4* 9.8*  HCT 27.0* 27.7* 30.1*  MCV 91.5 90.8 94.1  MCH 30.8 30.8 30.6  MCHC 33.7 33.9 32.6  RDW 17.1* 17.6* 17.7*  PLT 14* 42* 27*    BNP Recent Labs  Lab 04/22/24 2342  BNP 227.1*    DDimer  Recent Labs  Lab 04/17/24 1017  DDIMER 19.61*     Radiology  DG Chest Port 1 View Result Date: 04/22/2024 EXAM: 1 VIEW(S) XRAY OF THE CHEST 04/22/2024 10:41:00 PM COMPARISON:  04/13/2024 CLINICAL HISTORY: Tachycardia. FINDINGS: LUNGS AND PLEURA: Right mid lung zone linear atelectasis. Left hemidiaphragm calcified pleural plaque. Elevated right hemidiaphragm. No pleural effusion. No pneumothorax. HEART AND MEDIASTINUM: Aortic atherosclerosis. No acute abnormality of the cardiac and mediastinal silhouettes. BONES AND SOFT TISSUES: No acute osseous abnormality. IMPRESSION: 1. No acute cardiopulmonary process. 2. Right mid lung linear atelectasis. 3. Calcified pleural plaque along the left hemidiaphragm. Electronically signed by: [name] 04/22/2024 10:44 PM EST RP Workstation: [id number]     Patient Profile   78 y.o. female with past medical history of COPD, CVA, hypertension admitted with septic shock (echocardiogram with possible aortic valve vegetation and course complicated by thrombocytopenia and acute liver failure) with SVT.  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  Assessment & Plan  1 supraventricular tachycardia-patient developed SVT last evening.  She is in sinus this morning.  Agree with metoprolol .  Follow telemetry.  2 question aortic valve vegetation-patient is on antibiotics.  Can consider transesophageal echocardiogram later as she improves (platelet count is presently 27,000).  3 acute liver failure-improving.  4 infrarenal abdominal aortic aneurysm-follow-up as an outpatient.  For questions or updates, please contact [facility] Please consult [email or web address] for contact info under       Signed, [name], MD  04/23/2024, 8:45 AM

## 2024-04-23 NOTE — Progress Notes (Signed)
 Physical Therapy Treatment Patient Details Name: [name] MRN: [id number] DOB: January 31, 1946 Today's Date: 04/23/2024   History of Present Illness 78 y/o female presenting 04/14/24 after roommate called EMS as the patient was unresponsive. She reports chest pain and was very confused. Pt found under single blanket, alone with no heat or lights on in home. PMHx:  HTN, HLD, CVA left hemiparesis, CAPD, DVT, IVH. UA positive.    PT Comments  Pt is demonstrating improved tolerance to sitting EOB for balance and strengthening activities. Pt has difficulty following cues, and does so appropriately ~25% of the time. Lateral and fwd/bkwd weight shift performed while EOB with increased pushing to the L noted with onset of fatigue. Pt does well visually tracking to the L and attends to L UE with R UE to perform self PROM. Pt cued to press into the ground as if preparing to stand and was able to produce strong quad activation on R LE. Minimal activation noted on L LE. Pt unable to produce level of strength needed to safely stand at this time. Pt would benefit from continued PT services focused on bed mobility, transfers, strengthening, and seated balance to promote improved functional mobility.    If plan is discharge home, recommend the following: A lot of help with walking and/or transfers;A lot of help with bathing/dressing/bathroom;Assistance with cooking/housework;Direct supervision/assist for medications management;Direct supervision/assist for financial management;Assist for transportation;Help with stairs or ramp for entrance;Supervision due to cognitive status   Can travel by private vehicle     No  Equipment Recommendations  Rolling walker (2 wheels);BSC/3in1;Wheelchair (measurements PT);Wheelchair cushion (measurements PT);Hospital bed;Hoyer lift    Recommendations for Other Services       Precautions / Restrictions Precautions Precautions: Fall Recall of Precautions/Restrictions:  Impaired Restrictions Weight Bearing Restrictions Per Provider Order: No     Mobility  Bed Mobility Overal bed mobility: Needs Assistance Bed Mobility: Supine to Sit, Sit to Supine     Supine to sit: Max assist Sit to supine: Mod assist (B LE assist, pt does well managing trunk with cues)   General bed mobility comments: L hip pain increases when bringing legs over EOB    Transfers Overall transfer level: Needs assistance Equipment used:  (Gait belt) Transfers:  (Lateral scooting EOB)             General transfer comment: Pt cued to attempt to use R UE to assist with lift to bring hips closer to [facility]. Pt able to produce force through R LE when cued to assist with lift, shift, lower technique. Minimal hip clearance occured for lateral scoot x1 prior to returning to supine in bed. Pt unable to produce enough strength to safely perform STS at this time.    Ambulation/Gait                   Stairs             Wheelchair Mobility     Tilt Bed    Modified Rankin (Stroke Patients Only)       Balance Overall balance assessment: Needs assistance Sitting-balance support: Single extremity supported Sitting balance-Leahy Scale: Fair Sitting balance - Comments: Pt improving seated balance while seated EOB. L trunk lean increases with fatigue. Pt initially able to be assisted to midline but begins to push to the L with increased fatigue. Attempted to maintain midline position without UE assist, pt slowly leans to the L. Increased encouragement required to lean to the R. Pt does well with forward/backward  trunk lean and returning to midline. Unable to maintain midline with cross body reaches with R UE. Postural control: Left lateral lean                                  Communication Communication Communication: Impaired Factors Affecting Communication: Hearing impaired;Difficulty expressing self;Reduced clarity of speech  Cognition Arousal:  Alert Behavior During Therapy: WFL for tasks assessed/performed   PT - Cognitive impairments: Awareness, Safety/Judgement, Problem solving, Sequencing, Initiation, Attention, History of cognitive impairments, Memory Difficult to assess due to: Hard of hearing/deaf                       Following commands: Intact Following commands impaired: Only follows one step commands consistently (Pt following commands ~25% of the time. Cues often have to be repeated due to [place])    Cueing Cueing Techniques: Verbal cues, Tactile cues, Visual cues  Exercises General Exercises - Upper Extremity Elbow Flexion: AAROM, Left, 5 reps, Supine (Performed actively with R UE) Elbow Extension: AAROM, Left, 5 reps, Supine (Performed actively with R UE) General Exercises - Lower Extremity Ankle Circles/Pumps: AAROM, 10 reps, Both, Seated Long Arc Quad: AAROM, Right, 10 reps, Seated (PROM on L LE)    General Comments General comments (skin integrity, edema, etc.): No new skin abnormalities noted.      Pertinent Vitals/Pain Pain Assessment Pain Assessment: Faces Faces Pain Scale: Hurts little more Pain Location: L hip Pain Descriptors / Indicators: Sore, Sharp, Aching Pain Intervention(s): Limited activity within patient's tolerance, Repositioned, Monitored during session    Home Living                          Prior Function            PT Goals (current goals can now be found in the care plan section) Progress towards PT goals: Progressing toward goals (Pt improving seated tolerance while EOB)    Frequency    Min 2X/week      PT Plan      Co-evaluation              AM-PAC PT 6 Clicks Mobility   Outcome Measure  Help needed turning from your back to your side while in a flat bed without using bedrails?: Total Help needed moving from lying on your back to sitting on the side of a flat bed without using bedrails?: Total Help needed moving to and from a bed to a  chair (including a wheelchair)?: Total Help needed standing up from a chair using your arms (e.g., wheelchair or bedside chair)?: Total Help needed to walk in hospital room?: Total Help needed climbing 3-5 steps with a railing? : Total 6 Click Score: 6    End of Session Equipment Utilized During Treatment: Gait belt Activity Tolerance: Patient limited by fatigue Patient left: in bed;with call bell/phone within reach;with bed alarm set Nurse Communication: Mobility status PT Visit Diagnosis: Muscle weakness (generalized) (M62.81);Hemiplegia and hemiparesis Hemiplegia - Right/Left: Left Hemiplegia - dominant/non-dominant: Non-dominant Hemiplegia - caused by: Cerebral infarction     Time: 1103-1127 PT Time Calculation (min) (ACUTE ONLY): 24 min  Charges:    $Therapeutic Exercise: 8-22 mins $Therapeutic Activity: 8-22 mins PT General Charges $$ ACUTE PT VISIT: 1 Visit                     Cleaven Demario  Mack PT, DPT  Acute Rehabilitation Services         Office: [phone]      [name] 04/23/2024, 1:18 PM

## 2024-04-23 NOTE — Progress Notes (Signed)
 [name]   DOB:1945-09-21   FM#:[id number]      ASSESSMENT & PLAN:  [name] is an 78 year old female patient admitted on 04/13/2024 with complaints of altered mental status.  Hematologic history is significant for thrombocytopenia and anemia.  Therefore hematology is following.   Thrombocytopenia, worsening -Platelet count 27K today.   - Platelets with initial response status post platelet transfusion.  Continues to downtrend despite IVIG x 2 days and 1 dose Nplate  given 04/21/2024. - May be secondary to acute illness and infection.  Immature platelet count is not elevated which does not support ITP although ITP is not ruled out.  TTP is ruled out. - Patient has history of thrombocytopenia and was seen in outpatient hematology office on 12/11/2023 for her platelets were noted to be mildly low 80K at that time. - Platelets smear WNL - Hold on steroids at this time given possible endocarditis. - Status post IVIG 1 g/kg x 2 days on 12/12 and 12/13. - Status post Nplate  given 04/21/2024.  Will plan Nplate  3 mcg/kg to be given 04/27/2024. - Recommend platelet transfusion for platelet count <20 K or <50 K with acute bleeding. - No bleeding noted at this time. - Continue to monitor closely for bleeding - Continue to monitor CBC with differential - Hematology/Dr. Lanny following.   Altered mental status - CT of head is negative - Likely secondary to liver failure and comorbidities - Appears to be improving. - Continue supportive care   Anemia History of GI bleed - Hemoglobin stable 9.8 today - Patient admitted August 2025 with GI bleed.  Hemoglobin dropped to 5 and she received PRBC transfusions. - Recommend PRBC transfusion for hemoglobin <7.0 - Continue to monitor CBC with differential   Acute liver failure - Unclear etiology. - Dopplers negative for thrombosis, showed steatosis. - LFTs continue to improve.  Bili slightly elevated. - Avoid hepatotoxic agents - Monitor hepatic  function  Infectious endocarditis - Cardio following closely.  Plans for TEE after platelets have improved.   History of B12 deficiency - Received B12 injections August 2025.  Recommended at that time continuation of weekly B12 injections however patient no-showed for outpatient appointments.   History of CVA - Denies headaches or other acute pain - Continue supportive care        Code Status Full   Subjective:  Patient seen sleeping comfortably in bed.  Wakes up to tactile stimuli.  Very hard of hearing.  Denies acute pain or bleeding.  Patient became tearful during discussion that her children have not come to visit.  No acute complaints offered.  Objective:   Intake/Output Summary (Last 24 hours) at 04/23/2024 1303 Last data filed at 04/23/2024 0945 Gross per 24 hour  Intake 2676.5 ml  Output --  Net 2676.5 ml     PHYSICAL EXAMINATION: ECOG PERFORMANCE STATUS: 4 - Bedbound  Vitals:   04/23/24 0814 04/23/24 1200  BP:    Pulse: 61   Resp:    Temp: 98.5 F (36.9 C) (!) 97.4 F (36.3 C)  SpO2:     Filed Weights   04/21/24 0500 04/22/24 0500 04/23/24 0500  Weight: 181 lb 10.5 oz (82.4 kg) 183 lb 6.8 oz (83.2 kg) 176 lb 2.4 oz (79.9 kg)    GENERAL: alert, no distress and comfortable + chronically ill-appearing + very hard of hearing SKIN: + Decubiti left hip and sacrum EYES: normal, conjunctiva are pink and non-injected, sclera clear OROPHARYNX: no exudate, no erythema and lips, buccal mucosa, and tongue  normal  NECK: supple, thyroid  normal size, non-tender, without nodularity LYMPH: no palpable lymphadenopathy in the cervical, axillary or inguinal LUNGS: clear to auscultation and percussion with normal breathing effort HEART: regular rate & rhythm and no murmurs and no lower extremity edema ABDOMEN: abdomen soft, non-tender and normal bowel sounds MUSCULOSKELETAL: + Ambulatory dysfunction PSYCH: alert & oriented x 3 with fluent speech NEURO: no focal  motor/sensory deficits   All questions were answered. The patient knows to call the clinic with any problems, questions or concerns.   The total time spent in the appointment was 40 minutes encounter with patient including review of chart and various tests results, discussions about plan of care and coordination of care plan  Olam JINNY Brunner, NP 04/23/2024 1:03 PM    Labs Reviewed:  Lab Results  Component Value Date   WBC 4.1 04/23/2024   HGB 9.8 (L) 04/23/2024   HCT 30.1 (L) 04/23/2024   MCV 94.1 04/23/2024   PLT 27 (LL) 04/23/2024   Recent Labs    04/14/24 0957 04/14/24 1958 04/15/24 0248 04/22/24 0259 04/22/24 2342 04/23/24 0351  NA 146* 143   < > 132* 135 134*  K 2.7* 3.5   < > 3.7 3.5 3.8  CL 110 110   < > 104 105 105  CO2 18* 20*   < > 24 24 24   GLUCOSE 88 167*   < > 71 93 97  BUN 42* 40*   < > 9 10 11   CREATININE 1.59* 1.43*   < > 0.56 0.57 0.57  CALCIUM  7.4* 7.4*   < > 7.3* 7.4* 7.6*  GFRNONAA 33* 37*   < > >60 >60 >60  PROT 5.4* 5.3*   < > 7.3 7.3 7.8  ALBUMIN  1.9* 1.9*   < > 1.8* 1.9* 2.1*  AST 2,619* 1,795*   < > 67* 68* 71*  ALT 1,437* 1,236*   < > 119* 103* 105*  ALKPHOS 82 82   < > 99 105 110  BILITOT 2.2* 2.0*   < > 1.2 1.1 1.4*  BILIDIR 1.0* 1.0*  --   --   --   --   IBILI 1.2* 1.0*  --   --   --   --    < > = values in this interval not displayed.    Studies Reviewed:   DG Chest Port 1 View Result Date: 04/22/2024 EXAM: 1 VIEW(S) XRAY OF THE CHEST 04/22/2024 10:41:00 PM COMPARISON: 04/13/2024 CLINICAL HISTORY: Tachycardia. FINDINGS: LUNGS AND PLEURA: Right mid lung zone linear atelectasis. Left hemidiaphragm calcified pleural plaque. Elevated right hemidiaphragm. No pleural effusion. No pneumothorax. HEART AND MEDIASTINUM: Aortic atherosclerosis. No acute abnormality of the cardiac and mediastinal silhouettes. BONES AND SOFT TISSUES: No acute osseous abnormality. IMPRESSION: 1. No acute cardiopulmonary process. 2. Right mid lung linear atelectasis.  3. Calcified pleural plaque along the left hemidiaphragm. Electronically signed by: [name] 04/22/2024 10:44 PM EST RP Workstation: [id number]   US  ABD LTD RUG W/LIVER DOPPLER Result Date: 04/16/2024 EXAM: RIGHT UPPER QUADRANT ULTRASOUND WITH DOPPLER EVALUATION 04/15/2024 11:50:00 PM COMPARISON: Right upper quadrant ultrasound 04/14/2024. CT 04/13/2024. CLINICAL HISTORY: Elevated LFTs. FINDINGS: LIVER: Increased echotexture throughout the liver is compatible with fatty infiltration. No evidence of intrahepatic biliary ductal dilatation. OTHER: No evidence of right upper quadrant ascites. Vascular/Doppler: Doppler interrogation of the abdominal vasculature was performed. There is normal Doppler flow in the normal direction of the hepatic vasculature. Normal hepatopetal flow of the hepatic artery and portal vein. Normal  hepatofugal flow of the hepatic veins. Normal Doppler waveforms are visualized. IMPRESSION: 1. Hepatic steatosis. 2. No evidence of hepatic vein or portal vein thrombosis. Electronically signed by: Franky Crease MD 04/16/2024 01:23 AM EST RP Workstation: [id number]   DG Swallowing Func-Speech Pathology Result Date: 04/15/2024 Table formatting from the original result was not included. Modified Barium Swallow Study Patient Details Name: Alechia Lezama MRN: [id number] Date of Birth: 08-13-1945 Today's Date: 04/15/2024 HPI/PMH: HPI: Ms. Arps is a 78 y/o female presenting 04/14/24 after roommate called EMS as the patient was unresponsive. She reports chest pain and was very confused. Pt found under single blanket, alone with no heat or lights on in home.   PMHx:  HTN, HLD, CVA left hemiparesis, CAPD, DVT, IVH. UA positive. SLP consulted for clinical swallow assessment. Clinical Impression: Pt presents with a mild-moderate oropharyngeal dysphagia per MBSS completed today. Recommend a mechanical altered/ground diet with nectar-thick liquids (straw ok), and meds crushed in applesauce. Oral deficits  included reduced oral strength and coordination resulting in intermittent anterior loss of liquids by spoon or cup sip. Pt impulsively drank all liquids and was unable to try single sips despite cues. There was consistent posterior loss of thin and nectar-thick liquids to the level of the pyriform sinuses pre swallow with some pooling. There was piecemeal swallow of pudding and softened cracker. Min cracker residue on hard palate cleared with prompted nectar-thick liquid wash. Pharyngeal deficits included reduced base of tongue retraction, reduced laryngeal vestibule closure, reduced pharyngeal stripping, and reduced distention of UES. Findings -There was transient aspiration of thin liquid residue during cleansing swallows. Residue was ejected to the level of the vocal cords (PAS-5) or the laryngeal vestibule (PAS-3) during swallow, which was inaudible. No stagnant aspiration in the airway observed. -There x1 instance of stagnant penetration of nectar-thick liquid wash post cracker. Penetrated residue did start to progress to level of the vocal cords (PAS3 - 5); though ejected with cued throat clear + swallow. All other penetration instances of nectar-thick liquids were transient. -There was min diffuse pharyngeal residue following liquids and pudding. A moderate amount of pharyngeal residue observed post cracker trial, which was cleared with cued dry swallow. Plan: SLP will follow up to assess diet tolerance and modify diet as indicated. Pt may benefit from a repeat MBSS prior to liquid advancement given that inaudible responses to penetration/aspiration events. Factors that may increase risk of adverse event in presence of aspiration Noe & Lianne 2021): Factors that may increase risk of adverse event in presence of aspiration Noe & Lianne 2021): Reduced cognitive function; Limited mobility; Frail or deconditioned; Dependence for feeding and/or oral hygiene; Inadequate oral hygiene; Reduced saliva; Weak  cough Recommendations/Plan: Swallowing Evaluation Recommendations Swallowing Evaluation Recommendations Recommendations: PO diet PO Diet Recommendation: Dysphagia 2 (Finely chopped); Mildly thick liquids (Level 2, nectar thick) Liquid Administration via: Cup; Straw Medication Administration: Crushed with puree Supervision: Staff to assist with self-feeding; Full supervision/cueing for swallowing strategies Swallowing strategies  : Minimize environmental distractions; Slow rate; Small bites/sips; Clear throat intermittently; Follow solids with liquids Postural changes: Position pt fully upright for meals Oral care recommendations: Oral care BID (2x/day) Treatment Plan Treatment Plan Treatment recommendations: Therapy as outlined in treatment plan below Follow-up recommendations: Skilled nursing-short term rehab (<3 hours/day) Functional status assessment: Patient has had a recent decline in their functional status and demonstrates the ability to make significant improvements in function in a reasonable and predictable amount of time. Treatment frequency: Min 1x/week Treatment duration: 2 weeks Interventions: Diet toleration  management by SLP; Trials of upgraded texture/liquids; Patient/family education; Compensatory techniques Recommendations Recommendations for follow up therapy are one component of a multi-disciplinary discharge planning process, led by the attending physician.  Recommendations may be updated based on patient status, additional functional criteria and insurance authorization. Assessment: Orofacial Exam: Orofacial Exam Oral Cavity: Oral Hygiene: Dried secretions Oral Cavity - Dentition: Poor condition; Missing dentition Orofacial Anatomy: WFL Oral Motor/Sensory Function: Unable to test Anatomy: Anatomy: WFL Boluses Administered: Boluses Administered Boluses Administered: Thin liquids (Level 0); Mildly thick liquids (Level 2, nectar thick); Puree; Solid  Oral Impairment Domain: Oral Impairment Domain  Lip Closure: Escape from interlabial space or lateral juncture, no extension beyond vermillion border Tongue control during bolus hold: Posterior escape of greater than half of bolus Bolus preparation/mastication: Disorganized chewing/mashing with solid pieces of bolus unchewed Bolus transport/lingual motion: Slow tongue motion Oral residue: Residue collection on oral structures Location of oral residue : Tongue Initiation of pharyngeal swallow : Pyriform sinuses  Pharyngeal Impairment Domain: Pharyngeal Impairment Domain Soft palate elevation: No bolus between soft palate (SP)/pharyngeal wall (PW) Laryngeal elevation: Complete superior movement of thyroid  cartilage with complete approximation of arytenoids to epiglottic petiole Anterior hyoid excursion: Complete anterior movement Epiglottic movement: Complete inversion Laryngeal vestibule closure: Incomplete, narrow column air/contrast in laryngeal vestibule Pharyngeal stripping wave : Present - diminished Pharyngeal contraction (A/P view only): N/A Pharyngoesophageal segment opening: Partial distention/partial duration, partial obstruction of flow Tongue base retraction: Narrow column of contrast or air between tongue base and PPW Pharyngeal residue: Collection of residue within or on pharyngeal structures Location of pharyngeal residue: Tongue base; Valleculae; Pyriform sinuses  Esophageal Impairment Domain: Esophageal Impairment Domain Esophageal clearance upright position: Esophageal retention Pill: No data recorded Penetration/Aspiration Scale Score: Penetration/Aspiration Scale Score 1.  Material does not enter airway: Puree; Solid 2.  Material enters airway, remains ABOVE vocal cords then ejected out: Mildly thick liquids (Level 2, nectar thick) 3.  Material enters airway, remains ABOVE vocal cords and not ejected out: Mildly thick liquids (Level 2, nectar thick); Thin liquids (Level 0) 5.  Material enters airway, CONTACTS cords and not ejected out: Thin  liquids (Level 0) 6.  Material enters airway, passes BELOW cords then ejected out: Thin liquids (Level 0) Compensatory Strategies: Compensatory Strategies Compensatory strategies: Yes Multiple swallows: Effective (when pt able to follow cue) Effective Multiple Swallows: Solid Chin tuck: -- (unable to attempt) Other(comment): Effective (throat clear+ swallow to eject penetration) Effective Other(comment): Mildly thick liquid (Level 2, nectar thick)   General Information: Caregiver present: No  Diet Prior to this Study: NPO   Temperature : Normal   Respiratory Status: WFL   Supplemental O2: None (Room air)   History of Recent Intubation: No  Behavior/Cognition: Alert; Cooperative [facility]) Self-Feeding Abilities: Needs set-up for self-feeding Baseline vocal quality/speech: Hypophonia/low volume Volitional Cough: Unable to elicit (occasional throat clear elicited) Volitional Swallow: Able to elicit (delayed) No data recorded Goal Planning: Prognosis for improved oropharyngeal function: Fair No data recorded No data recorded Patient/Family Stated Goal: none stated Consulted and agree with results and recommendations: Patient; Nurse Pain: Pain Assessment Pain Assessment: No/denies pain Pain Score: 0 Faces Pain Scale: 2 Facial Expression: 0 Body Movements: 0 Muscle Tension: 0 Compliance with ventilator (intubated pts.): N/A Vocalization (extubated pts.): 0 CPOT Total: 0 Pain Location: grimacing with mobility tasks Pain Descriptors / Indicators: Grimacing End of Session: Start Time:SLP Start Time (ACUTE ONLY): 1438 Stop Time: SLP Stop Time (ACUTE ONLY): 1500 Time Calculation:SLP Time Calculation (min) (ACUTE ONLY): 22 min  Charges: SLP Evaluations $ SLP Speech Visit: 1 Visit SLP Evaluations $MBS Swallow: 1 Procedure SLP visit diagnosis: SLP Visit Diagnosis: Dysphagia, oropharyngeal phase (R13.12) Past Medical History: Past Medical History: Diagnosis Date  Acute ischemic cerebrovascular accident (CVA) involving middle cerebral  artery territory (HCC) 09/16/2020  COPD (chronic obstructive pulmonary disease) (HCC)   History of DVT (deep vein thrombosis)   Hypertension   Thoracoabdominal aortic aneurysm   TIA (transient ischemic attack) 09/10/2020 Past Surgical History: Past Surgical History: Procedure Laterality Date  IR BONE MARROW BIOPSY & ASPIRATION  12/13/2023  IR CT HEAD LTD  09/11/2020  IR INTRAVSC STENT CERV CAROTID W/O EMB-PROT MOD SED INC ANGIO  09/11/2020  IR PERCUTANEOUS ART THROMBECTOMY/INFUSION INTRACRANIAL INC DIAG ANGIO  09/11/2020     IR PERCUTANEOUS ART THROMBECTOMY/INFUSION INTRACRANIAL INC DIAG ANGIO  09/11/2020  IR US  GUIDE VASC ACCESS RIGHT  09/11/2020  NO PAST SURGERIES    RADIOLOGY WITH ANESTHESIA N/A 09/11/2020  Procedure: IR WITH ANESTHESIA;  Surgeon: Dolphus Carrion, MD;  Location: [facility] OR;  Service: Radiology;  Laterality: N/A; [name] 04/15/2024, 3:33 PM  ECHOCARDIOGRAM COMPLETE Result Date: 04/15/2024    ECHOCARDIOGRAM REPORT   Patient Name:   MACI EICKHOLT Date of Exam: 04/15/2024 Medical Rec #:  [id number]   Height:       70.0 in Accession #:    [id number]  Weight:       164.2 lb Date of Birth:  04-18-1946  BSA:          1.920 m Patient Age:    78 years    BP:           134/57 mmHg Patient Gender: F           HR:           56 bpm. Exam Location:  Inpatient Procedure: 2D Echo, Cardiac Doppler and Color Doppler (Both Spectral and Color            Flow Doppler were utilized during procedure). Indications:    Shock  History:        Patient has prior history of Echocardiogram examinations, most                 recent 01/12/2024. Stroke, Signs/Symptoms:Altered Mental Status;                 Risk Factors:Dyslipidemia and Hypertension. ICH.  Sonographer:    Ellouise Mose RDCS Referring Phys: [phone] RAKESH V ALVA IMPRESSIONS  1. Left ventricular ejection fraction, by estimation, is 50 to 55%. Left ventricular ejection fraction by 2D MOD biplane is 51.5 %. Left ventricular ejection fraction by PLAX is 43 %. The left ventricle has  low normal function. The left ventricle demonstrates global hypokinesis. There is moderate left ventricular hypertrophy. Left ventricular diastolic parameters are consistent with Grade I diastolic dysfunction (impaired relaxation).  2. Right ventricular systolic function is normal. The right ventricular size is normal. Tricuspid regurgitation signal is inadequate for assessing PA pressure.  3. The mitral valve is degenerative. Trivial mitral valve regurgitation. No evidence of mitral stenosis.  4. Calcified mass on the non-coronary cusp with some mobile material, measures 0.5 x 1.0 cm, could be old vegetation or thrombus. The aortic valve is tricuspid. There is severe thickening of the aortic valve. Aortic valve regurgitation is not visualized. Aortic valve sclerosis/calcification is present, without any evidence of aortic stenosis.  5. Aortic dilatation noted. There is mild dilatation of the ascending aorta, measuring 40 mm.  6. The inferior  vena cava is dilated in size with <50% respiratory variability, suggesting right atrial pressure of 15 mmHg. Comparison(s): Changes from prior study are noted. 01/12/2024: LVEF 60-65%. FINDINGS  Left Ventricle: Left ventricular ejection fraction, by estimation, is 50 to 55%. Left ventricular ejection fraction by PLAX is 43 %. Left ventricular ejection fraction by 2D MOD biplane is 51.5 %. The left ventricle has low normal function. The left ventricle demonstrates global hypokinesis. 3D ejection fraction reviewed and evaluated as part of the interpretation. Alternate measurement of EF is felt to be most reflective of LV function. The left ventricular internal cavity size was normal in size. There is moderate left ventricular hypertrophy. Left ventricular diastolic parameters are consistent with Grade I diastolic dysfunction (impaired relaxation). Indeterminate filling pressures. Right Ventricle: The right ventricular size is normal. No increase in right ventricular wall thickness.  Right ventricular systolic function is normal. Tricuspid regurgitation signal is inadequate for assessing PA pressure. Left Atrium: Left atrial size was normal in size. Right Atrium: Right atrial size was normal in size. Pericardium: There is no evidence of pericardial effusion. Mitral Valve: The mitral valve is degenerative in appearance. Mild to moderate mitral annular calcification. Trivial mitral valve regurgitation. No evidence of mitral valve stenosis. Tricuspid Valve: The tricuspid valve is normal in structure. Tricuspid valve regurgitation is not demonstrated. No evidence of tricuspid stenosis. Aortic Valve: Calcified mass on the non-coronary cusp with some mobile material, measures 0.5 x 1.0 cm, could be old vegetation or thrombus. The aortic valve is tricuspid. There is severe thickening of the aortic valve. Aortic valve regurgitation is not visualized. Aortic valve sclerosis/calcification is present, without any evidence of aortic stenosis. Pulmonic Valve: The pulmonic valve was normal in structure. Pulmonic valve regurgitation is not visualized. No evidence of pulmonic stenosis. Aorta: Aortic dilatation noted. There is mild dilatation of the ascending aorta, measuring 40 mm. Venous: The inferior vena cava is dilated in size with less than 50% respiratory variability, suggesting right atrial pressure of 15 mmHg. IAS/Shunts: No atrial level shunt detected by color flow Doppler. Additional Comments: 3D was performed not requiring image post processing on an independent workstation and was abnormal.  LEFT VENTRICLE PLAX 2D                        Biplane EF (MOD) LV EF:         Left            LV Biplane EF:   Left                ventricular                      ventricular                ejection                         ejection                fraction by                      fraction by                PLAX is 43                       2D MOD                %.  biplane is LVIDd:          4.70 cm                          51.5 %. LVIDs:         3.70 cm LV PW:         1.50 cm         Diastology LV IVS:        1.50 cm         LV e' medial:    3.81 cm/s LVOT diam:     2.30 cm         LV E/e' medial:  11.5 LV SV:         87              LV e' lateral:   3.70 cm/s LV SV Index:   45              LV E/e' lateral: 11.9 LVOT Area:     4.15 cm  LV Volumes (MOD) LV vol d, MOD    96.7 ml       3D Volume EF: A2C:                           3D EF:        46 % LV vol d, MOD    81.7 ml       LV EDV:       174 ml A4C:                           LV ESV:       95 ml LV vol s, MOD    44.3 ml       LV SV:        79 ml A2C: LV vol s, MOD    42.3 ml A4C: LV SV MOD A2C:   52.4 ml LV SV MOD A4C:   81.7 ml LV SV MOD BP:    46.9 ml RIGHT VENTRICLE             IVC RV S prime:     17.70 cm/s  IVC diam: 2.10 cm TAPSE (M-mode): 1.9 cm                             PULMONARY VEINS                             Diastolic Velocity: 27.90 cm/s                             S/D Velocity:       1.60                             Systolic Velocity:  44.10 cm/s LEFT ATRIUM             Index        RIGHT ATRIUM           Index LA diam:        3.90 cm 2.03 cm/m   RA Area:     11.40 cm LA Vol (A2C):   39.3 ml 20.47 ml/m  RA Volume:   24.10 ml  12.55 ml/m LA Vol (A4C):   42.8 ml 22.30 ml/m LA Biplane Vol: 41.7 ml 21.72 ml/m  AORTIC VALVE LVOT Vmax:   100.00 cm/s LVOT Vmean:  58.600 cm/s LVOT VTI:    0.210 m  AORTA Ao Root diam: 3.30 cm Ao Asc diam:  3.95 cm MITRAL VALVE MV Area (PHT): 2.32 cm    SHUNTS MV Decel Time: 327 msec    Systemic VTI:  0.21 m MV E velocity: 44.00 cm/s  Systemic Diam: 2.30 cm MV A velocity: 89.70 cm/s MV E/A ratio:  0.49 Vinie Maxcy MD Electronically signed by Vinie Maxcy MD Signature Date/Time: 04/15/2024/3:29:26 PM    Final    DG Abd Portable 1V Result Date: 04/14/2024 EXAM: 1 VIEW XRAY OF THE ABDOMEN 04/14/2024 06:02:00 PM COMPARISON: Same day knees. CLINICAL HISTORY: Confusion. FINDINGS: LINES, TUBES AND  DEVICES: Nasogastric tube tip has been advanced into body of stomach and is in grossly good position. BOWEL: Nonobstructive bowel gas pattern. SOFT TISSUES: No abnormal calcifications. BONES: No acute fracture. IMPRESSION: 1. Nasogastric tube tip projects over the body of the stomach in expected position. Electronically signed by: Lynwood Seip MD 04/14/2024 06:13 PM EST RP Workstation: [id number]   DG Abd 1 View Result Date: 04/14/2024 EXAM: 1 VIEW XRAY OF THE ABDOMEN 04/14/2024 12:00:00 PM COMPARISON: None available. CLINICAL HISTORY: Nasogastric tube present. FINDINGS: LINES, TUBES AND DEVICES: Enteric tube in place with tip projecting over the stomach. Side port projecting over the distal esophagus. BOWEL: Nonobstructive bowel gas pattern. SOFT TISSUES: Atherosclerotic calcifications and abdominal aortic aneurysm. BONES: No acute fracture. IMPRESSION: 1. Enteric tube in place with tip projecting over the stomach and side port projecting over the distal esophagus. Recommend advancing the tube so the side port is beyond the gastroesophageal junction. 2. Abdominal aortic aneurysm with atherosclerotic calcifications. This is described in greater detail on CT of the abdomen and pelvis without contrast 04/13/2024 . Electronically signed by: Lonni Necessary MD 04/14/2024 04:27 PM EST RP Workstation: [id number]   US  RENAL Result Date: 04/14/2024 EXAM: US  Retroperitoneum Complete, Renal. 04/14/2024 12:20:00 PM TECHNIQUE: Real-time ultrasonography of the retroperitoneum renal was performed. COMPARISON: None available CLINICAL HISTORY: Acute kidney failure. FINDINGS: FINDINGS: RIGHT KIDNEY/URETER: Right kidney measures 10.5 x 5.5 x 5.0 cm. Normal cortical echogenicity. No hydronephrosis. No calculus. No mass. LEFT KIDNEY/URETER: Left kidney measures 10.5 x 5.3 x 4.9 cm. Normal cortical echogenicity. No hydronephrosis. No calculus. No mass. BLADDER: Urinary bladder is decompressed secondary to foley catheter.  IMPRESSION: 1. No acute findings. Electronically signed by: Lynwood Seip MD 04/14/2024 12:50 PM EST RP Workstation: [id number]   US  Abdomen Limited RUQ (LIVER/GB) Result Date: 04/14/2024 EXAM: Right Upper Quadrant Abdominal Ultrasound TECHNIQUE: Real-time ultrasonography of the right upper quadrant of the abdomen was performed. COMPARISON: None available. CLINICAL HISTORY: LFT elevation. FINDINGS: LIVER: Increased hepatic steatosis. No intrahepatic biliary ductal dilatation. No mass. BILIARY SYSTEM: No evidence of pericholecystic fluid or wall thickening. No cholelithiasis. Common bile duct is within normal limits measuring 2.3 mm. RIGHT KIDNEY: The right kidney is grossly unremarkable in appearances without evidence of hydronephrosis, echogenic calculi or worrisome mass lesions. OTHER: No right upper quadrant ascites. IMPRESSION: 1. Hepatic steatosis. Electronically signed by: Morgane Naveau MD 04/14/2024 12:40 AM EST RP Workstation: [id number]   DG Chest Port 1 View Result Date: 04/13/2024 EXAM: 1 VIEW(S) XRAY OF THE CHEST 04/13/2024 08:39:00 PM COMPARISON: 01/11/2024. CLINICAL HISTORY: Questionable sepsis - evaluate for abnormality FINDINGS: LUNGS AND PLEURA: Left pleural calcifications noted.  No focal pulmonary opacity. No pleural effusion. No pneumothorax. HEART AND MEDIASTINUM: Aortic atherosclerosis. BONES AND SOFT TISSUES: No acute osseous abnormality. IMPRESSION: 1. No acute cardiopulmonary abnormality. 2. Left pleural calcifications. 3. Aortic atherosclerosis. Electronically signed by: Franky Crease MD 04/13/2024 09:40 PM EST RP Workstation: [id number]   CT CHEST ABDOMEN PELVIS WO CONTRAST Result Date: 04/13/2024 EXAM: CT CHEST, ABDOMEN AND PELVIS WITHOUT CONTRAST 04/13/2024 08:59:36 PM TECHNIQUE: CT of the chest, abdomen and pelvis was performed without the administration of intravenous contrast. Multiplanar reformatted images are provided for review. Automated exposure control, iterative  reconstruction, and/or weight based adjustment of the mA/kV was utilized to reduce the radiation dose to as low as reasonably achievable. COMPARISON: 01/12/2024 CLINICAL HISTORY: abd pain FINDINGS: CHEST: MEDIASTINUM AND LYMPH NODES: Heart and pericardium are unremarkable. Diffuse coronary artery and aortic atherosclerosis. The central airways are clear. No mediastinal, hilar or axillary lymphadenopathy. LUNGS AND PLEURA: Like scarring in the left lower lobe posteriorly. Calcified pleural plaques in the left hemithorax, stable. No focal consolidation or pulmonary edema. No pleural effusion or pneumothorax. ABDOMEN AND PELVIS: LIVER: The liver is unremarkable. GALLBLADDER AND BILE DUCTS: Gallbladder is unremarkable. No biliary ductal dilatation. SPLEEN: No acute abnormality. PANCREAS: No acute abnormality. ADRENAL GLANDS: No acute abnormality. KIDNEYS, URETERS AND BLADDER: Foley catheter in the bladder, which is decompressed. No stones in the kidneys or ureters. No hydronephrosis. No perinephric or periureteral stranding. GI AND BOWEL: Stomach demonstrates no acute abnormality. Normal appendix. There is no bowel obstruction. REPRODUCTIVE ORGANS: No acute abnormality. PERITONEUM AND RETROPERITONEUM: No ascites. No free air. VASCULATURE: Infrarenal abdominal aortic aneurysm measures up to 4.2 cm. 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 Diffuse aortoiliac atherosclerosis. ABDOMINAL AND PELVIS LYMPH NODES: No lymphadenopathy. BONES AND SOFT TISSUES: No acute osseous abnormality. No focal soft tissue abnormality. IMPRESSION: 1. 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 Diffuse aortoiliac atherosclerosis; recommend CT or MRI in 12 months and establish or continue care with a vascular specialist. 2. Coronary artery disease. 3. Calcified pleural plaques on the left. Scarring at the left lung base. Electronically signed by:  Franky Crease MD 04/13/2024 09:06 PM EST RP Workstation: [id number]   CT Head Wo Contrast Result Date: 04/13/2024 EXAM: CT HEAD WITHOUT CONTRAST 04/13/2024 08:58:58 PM TECHNIQUE: CT of the head was performed without the administration of intravenous contrast. Automated exposure control, iterative reconstruction, and/or weight based adjustment of the mA/kV was utilized to reduce the radiation dose to as low as reasonably achievable. COMPARISON: 12/10/2023 CLINICAL HISTORY: delirium FINDINGS: BRAIN AND VENTRICLES: No acute hemorrhage. No evidence of acute infarct. Age-related cerebral and cerebellar volume loss. Remote encephalomalacia in right parietal lobe. Mild periventricular white matter disease. Ex vacuo dilatation of right lateral ventricle. No extra-axial collection. No mass effect or midline shift. Moderate calcific atheromatous disease within carotid siphons and vertebral arteries. ORBITS: No acute abnormality. SINUSES: Moderate bilateral ethmoid sinus disease. Bilateral maxillary sinus disease with air-fluid levels. Mild bilateral sphenoid and frontal sinus disease. SOFT TISSUES AND SKULL: No acute soft tissue abnormality. No skull fracture. IMPRESSION: 1. No acute intracranial abnormality. 2. Stable right parietal cortical encephalomalacia. 3. Stable age-related changes. 4. Bilateral maxillary sinus disease with air-fluid levels, with additional ethmoid, sphenoid, and frontal sinus involvement, suggestive of acute sinusitis. Electronically signed by: Dorethia Molt MD 04/13/2024 09:05 PM EST RP Workstation: [id number]

## 2024-04-24 DIAGNOSIS — I471 Supraventricular tachycardia, unspecified: Secondary | ICD-10-CM | POA: Diagnosis not present

## 2024-04-24 LAB — GLUCOSE, CAPILLARY
Glucose-Capillary: 70 mg/dL (ref 70–99)
Glucose-Capillary: 94 mg/dL (ref 70–99)
Glucose-Capillary: 73 mg/dL (ref 70–99)
Glucose-Capillary: 88 mg/dL (ref 70–99)
Glucose-Capillary: 107 mg/dL — ABNORMAL HIGH (ref 70–99)
Glucose-Capillary: 76 mg/dL (ref 70–99)

## 2024-04-24 LAB — CBC WITH DIFFERENTIAL/PLATELET
Basophils Absolute: 0 K/uL (ref 0.0–0.1)
Basophils Relative: 0 %
Eosinophils Absolute: 0 K/uL (ref 0.0–0.5)
Eosinophils Relative: 1 %
HCT: 26.3 % — ABNORMAL LOW (ref 36.0–46.0)
Hemoglobin: 8.8 g/dL — ABNORMAL LOW (ref 12.0–15.0)
Lymphocytes Relative: 17 %
Lymphs Abs: 0.6 K/uL — ABNORMAL LOW (ref 0.7–4.0)
MCH: 31.3 pg (ref 26.0–34.0)
MCHC: 33.5 g/dL (ref 30.0–36.0)
MCV: 93.6 fL (ref 80.0–100.0)
Monocytes Absolute: 0.1 K/uL (ref 0.1–1.0)
Monocytes Relative: 4 %
Neutro Abs: 2.8 K/uL (ref 1.7–7.7)
Neutrophils Relative %: 78 %
Platelets: 37 K/uL — ABNORMAL LOW (ref 150–400)
RBC: 2.81 MIL/uL — ABNORMAL LOW (ref 3.87–5.11)
RDW: 17.7 % — ABNORMAL HIGH (ref 11.5–15.5)
WBC: 3.6 K/uL — ABNORMAL LOW (ref 4.0–10.5)
nRBC: 0 % (ref 0.0–0.2)

## 2024-04-24 LAB — COMPREHENSIVE METABOLIC PANEL WITH GFR
ALT: 100 U/L — ABNORMAL HIGH (ref 0–44)
AST: 64 U/L — ABNORMAL HIGH (ref 15–41)
Albumin: 2.2 g/dL — ABNORMAL LOW (ref 3.5–5.0)
Alkaline Phosphatase: 114 U/L (ref 38–126)
Anion gap: 7 (ref 5–15)
BUN: 10 mg/dL (ref 8–23)
CO2: 21 mmol/L — ABNORMAL LOW (ref 22–32)
Calcium: 8 mg/dL — ABNORMAL LOW (ref 8.9–10.3)
Chloride: 106 mmol/L (ref 98–111)
Creatinine, Ser: 0.52 mg/dL (ref 0.44–1.00)
GFR, Estimated: >60 mL/min (ref >60)
Glucose, Bld: 70 mg/dL (ref 70–99)
Potassium: 3.9 mmol/L (ref 3.5–5.1)
Sodium: 133 mmol/L — ABNORMAL LOW (ref 135–145)
Total Bilirubin: 1 mg/dL (ref 0.0–1.2)
Total Protein: 6.9 g/dL (ref 6.5–8.1)

## 2024-04-24 LAB — MAGNESIUM: Magnesium: 1.9 mg/dL (ref 1.7–2.4)

## 2024-04-24 LAB — CK: Total CK: 86 U/L (ref 38–234)

## 2024-04-24 LAB — PHOSPHORUS: Phosphorus: 2.5 mg/dL (ref 2.5–4.6)

## 2024-04-24 MED ORDER — AMLODIPINE BESYLATE 10 MG PO TABS
10.0000 mg | ORAL_TABLET | Freq: Every day | ORAL | Status: DC
  Administered 2024-04-24 – 2024-05-05 (×10): 10 mg via ORAL
  Filled 2024-04-24 (×11): qty 1

## 2024-04-24 MED ORDER — HYDRALAZINE HCL 20 MG/ML IJ SOLN: 10.0000 mg | Freq: Four times a day (QID) | INTRAMUSCULAR | Status: DC | PRN

## 2024-04-24 MED ORDER — HYDROCODONE-ACETAMINOPHEN 5-325 MG PO TABS
1.0000 | ORAL_TABLET | Freq: Four times a day (QID) | ORAL | Status: DC | PRN
  Administered 2024-04-24 – 2024-04-25 (×2): 1 via ORAL
  Filled 2024-04-24 (×2): qty 1

## 2024-04-24 NOTE — Progress Notes (Signed)
 Pharmacy Antibiotic Note  [name] is a 78 y.o. female admitted on 04/13/2024 with concern for AV vegetation vs thrombus.  Pharmacy has been consulted for Daptomycin  + Rocephin  dosing. TEE currently precluded by thrombocytopenia.   CK 232 on 12/8, CK 86 on 12/17 , CrCl>30 ml/min.   Plan: - Cont Daptomycin  700 mg IV every 24 hours - Cont Rocephin  2g IV every 24 hours - weekly CK - Will monitor renal function, additional work-up, and plans for LOT  Height: 5' 10 (177.8 cm) Weight: 79.9 kg (176 lb 2.4 oz) IBW/kg (Calculated) : 68.5  Temp (24hrs), Avg:98.6 F (37 C), Min:97.4 F (36.3 C), Max:99.7 F (37.6 C)  Recent Labs  Lab 04/21/24 0408 04/22/24 0259 04/22/24 2342 04/23/24 0351 04/24/24 0328  WBC 4.1 4.2 3.8* 4.1 3.6*  CREATININE 0.49 0.56 0.57 0.57 0.52    Estimated Creatinine Clearance: 60.7 mL/min (by C-G formula based on SCr of 0.52 mg/dL).    Allergies  Allergen Reactions   Iodinated Contrast Media Itching and Other (See Comments)    9/1 developed diffuse itching after contrast dye for CT (without rash or airway involvement or GI symptoms) . Consider pretreatment before IV contrast in the future     Antimicrobials this admission: Vancomycin  12/6 x 1 Flagyl  12/6 x 1 Cefepime  12/6 x 1 Zosyn  12/7 >> 12/8 Rocephin  12/9 >> Daptomycin  12/9 >>   Microbiology results: 12/6 COVD/flu/RSV >> neg 12/6 MRSA PCR neg 12/6 BCX>>ngF 12/9 bcx >>ngF  Thank you for allowing pharmacy to be a part of this patients care.  Massie Fila, PharmD Clinical Pharmacist  04/24/2024 7:40 AM

## 2024-04-24 NOTE — Progress Notes (Signed)
 Rounding Note   Patient Name: [name] Date of Encounter: 04/24/2024  [facility] Cardiologist: Aleene Passe, MD (Inactive)   Subjective Denies CP or dyspnea  Scheduled Meds:  feeding supplement  237 mL Oral BID BM   Gerhardt's butt cream   Topical BID   lactulose   20 g Oral TID   metoprolol  tartrate  25 mg Oral BID   multivitamin with minerals  1 tablet Oral Daily   mouth rinse  15 mL Mouth Rinse 4 times per day   pantoprazole   40 mg Oral BID   [START ON 04/27/2024] romiPLOStim   3 mcg/kg Subcutaneous Weekly   thiamine   100 mg Oral Daily   Continuous Infusions:  cefTRIAXone  (ROCEPHIN )  IV 2 g (04/23/24 1357)   DAPTOmycin  700 mg (04/23/24 1315)   PRN Meds: docusate, HYDROcodone -acetaminophen , ipratropium-albuterol , metoprolol  tartrate, morphine  injection, ondansetron  (ZOFRAN ) IV, mouth rinse, polyethylene glycol   Vital Signs  Vitals:   04/23/24 2308 04/24/24 0346 04/24/24 0400 04/24/24 0741  BP:  (!) 160/88 (!) 142/62 (!) 150/73  Pulse:   (!) 54 60  Resp:  18  (!) 22  Temp: 99.7 F (37.6 C) 98.5 F (36.9 C)  98.7 F (37.1 C)  TempSrc: Axillary Axillary  Axillary  SpO2:  98%  97%  Weight:      Height:        Intake/Output Summary (Last 24 hours) at 04/24/2024 0851 Last data filed at 04/24/2024 0354 Gross per 24 hour  Intake 830 ml  Output --  Net 830 ml      04/23/2024    5:00 AM 04/22/2024    5:00 AM 04/21/2024    5:00 AM  Last 3 Weights  Weight (lbs) 176 lb 2.4 oz 183 lb 6.8 oz 181 lb 10.5 oz  Weight (kg) 79.9 kg 83.2 kg 82.4 kg      Telemetry Sinus - Personally Reviewed   Physical Exam  GEN: NAD Neck: supple Cardiac: RRR Respiratory: CTA GI: Soft, NT/ND MS: No edema Neuro:  Grossly intact Psych: Normal affect   Labs High Sensitivity Troponin:   Recent Labs  Lab 04/13/24 2123 04/13/24 2308  TROPONINIHS 316* 254*    Chemistry Recent Labs  Lab 04/22/24 2342 04/23/24 0351 04/24/24 0328  NA 135 134* 133*  K 3.5  3.8 3.9  CL 105 105 106  CO2 24 24 21*  GLUCOSE 93 97 70  BUN 10 11 10   CREATININE 0.57 0.57 0.52  CALCIUM  7.4* 7.6* 8.0*  MG 1.7 1.8 1.9  PROT 7.3 7.8 6.9  ALBUMIN  1.9* 2.1* 2.2*  AST 68* 71* 64*  ALT 103* 105* 100*  ALKPHOS 105 110 114  BILITOT 1.1 1.4* 1.0  GFRNONAA >60 >60 >60  ANIONGAP 6 5 7      Hematology Recent Labs  Lab 04/22/24 2342 04/23/24 0351 04/24/24 0328  WBC 3.8* 4.1 3.6*  RBC 3.05* 3.20* 2.81*  HGB 9.4* 9.8* 8.8*  HCT 27.7* 30.1* 26.3*  MCV 90.8 94.1 93.6  MCH 30.8 30.6 31.3  MCHC 33.9 32.6 33.5  RDW 17.6* 17.7* 17.7*  PLT 42* 27* 37*    BNP Recent Labs  Lab 04/22/24 2342  BNP 227.1*    DDimer  Recent Labs  Lab 04/17/24 1017  DDIMER 19.61*     Radiology  DG Chest Port 1 View Result Date: 04/22/2024 EXAM: 1 VIEW(S) XRAY OF THE CHEST 04/22/2024 10:41:00 PM COMPARISON: 04/13/2024 CLINICAL HISTORY: Tachycardia. FINDINGS: LUNGS AND PLEURA: Right mid lung zone linear atelectasis. Left hemidiaphragm calcified  pleural plaque. Elevated right hemidiaphragm. No pleural effusion. No pneumothorax. HEART AND MEDIASTINUM: Aortic atherosclerosis. No acute abnormality of the cardiac and mediastinal silhouettes. BONES AND SOFT TISSUES: No acute osseous abnormality. IMPRESSION: 1. No acute cardiopulmonary process. 2. Right mid lung linear atelectasis. 3. Calcified pleural plaque along the left hemidiaphragm. Electronically signed by: [name] 04/22/2024 10:44 PM EST RP Workstation: [id number]     Patient Profile   78 y.o. female with past medical history of COPD, CVA, hypertension admitted with septic shock (echocardiogram with possible aortic valve vegetation and course complicated by thrombocytopenia and acute liver failure) with SVT.  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  Assessment & Plan  1  supraventricular tachycardia-no recurrent SVT on telemetry.  Continue metoprolol .  2 question aortic valve vegetation-patient is on antibiotics.  Can consider transesophageal echocardiogram later as she improves (platelet count is presently 27,000).  3 acute liver failure-improving.  4 infrarenal abdominal aortic aneurysm-follow-up as an outpatient.  Will sign off.  Please call us  when she is ready for transesophageal echocardiogram.  For questions or updates, please contact [facility] Please consult [email or web address] for contact info under       Signed, [name], MD  04/24/2024, 8:51 AM

## 2024-04-24 NOTE — Progress Notes (Signed)
 PROGRESS NOTE        PATIENT DETAILS Name: Noora Locascio Age: 78 y.o. Sex: female Date of Birth: Oct 22, 1945 Admit Date: 04/13/2024 Admitting Physician Tamela Stakes, MD ERE:Bzmryzrx, Rojelio PARAS, NP  Brief Summary: Patient is a 78 y.o.  female with history of CVA, COPD, HTN-who was brought to the ED after she was found by her roommate unresponsive (no heat/light-under a single blanket)-she was apparently covered in feces/dried blood-found to be hypothermic/hypotensive/hypoglycemic-she was thought to have undifferentiated shock-acute liver failure-acute kidney injury-admitted to the ICU-stabilized with pressors/D10 infusion/broad-spectrum antibiotics-rapidly improved and subsequently transferred to [facility].  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 12/8>> ANA: Positive 12/8>> anti-smooth muscle antibody: Negative  Significant microbiology data: 12/6>> COVID/influenza/RSV PCR: Negative 12/6>> blood culture: No growth 12/6>> acute hepatitis  serology: Negative 12/6>> CMV DNA PCR: Negative 12/9>> blood culture: No growth  Procedures: None  Consults: PCCM GI ID Hematology [name])  Subjective: Patient in bed, appears comfortable, denies any headache, no fever, no chest pain or pressure, no shortness of breath , no abdominal pain. No focal weakness.  Chronic left hip and leg pain  Objective: Vitals: Blood pressure (!) 150/73, pulse 60, temperature 98.7 F (37.1 C), temperature source Axillary, resp. rate (!) 22, height 5' 10 (1.778 m), weight 79.9 kg, SpO2 97%.   Exam:  Awake Alert, No new F.N deficits, left-sided weakness from stroke Yoe.AT,PERRAL Supple Neck, No JVD,   Symmetrical Chest wall movement, Good air movement bilaterally, CTAB RRR,No Gallops, Rubs or new Murmurs,  +ve B.Sounds, Abd Soft, No tenderness,   No Cyanosis, Clubbing or edema    Assessment/Plan:  Undifferentiated shock Unclear etiology-initially thought to have septic shock but all cultures negative to date-has rapidly improved-initially on admission to the ICU was started on vancomycin /Zosyn  but all antibiotics were discontinued on 12/7.  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  Plans were to obtain TEE but per cardiology-unable to safely perform TEE unless platelet count persistently above 50K.  Will need to notify cardiology once platelet counts are persistently above 50K.  Thrombocytopenia Likely secondary to acute liver injury/failure-possible sepsis physiology-however no improvement in spite of improvement in her sepsis physiology and liver injury.  No evidence of TTP on peripheral smear-unclear if this is ITP. Given continued transfusions as needed last on 04/20/2023.   Case discussed with hematology on-call-Dr. Gudina on 04/21/2024, no further platelet  transfusions today, Nplate  started on 04/21/2024, hematology following, 1 unit of platelet transfusion due to  persistent severe thrombocytopenia on 04/22/2024, discussed with hematology on 04/21/2024 and 04/22/2024.  Normocytic anemia Likely secondary to critical illness Continue to trend/follow CBC, per hematology 1 unit of packed RBC on 04/21/2024.  History of SVT night of 04/22/2024, seen by cardiology, resolved after adenosine .  Placed on low-dose beta-blocker, currently stable, stable TSH continue to monitor.  Acute metabolic encephalopathy Secondary to hypothermia/hypoglycemia/undifferentiated shock/acute liver failure Head CT without acute abnormalities Seems to have clinically improved after treatment of underlying etiologies.  Hypothermia Likely secondary to environmental exposure (per prior notes- cool room with single blanket-no heating or light) TSH stable Now normothermic after supportive care  Acute liver failure Unclear etiology-possibly shock liver from hypotension from possible sepsis physiology LFTs/coags improving Dopplers without portal/hepatic vein thrombosis Acute hepatitis serology negative CMV IgM positive but viral load negative  Negative workup for autoimmune hepatitis GI following-completed N-acetylcysteine  12/10 Empirically on lactulose   Oropharyngeal dysphagia Secondary to critical illness/encephalopathy SLP following-on dysphagia 2 diet.  AKI Hemodynamically mediated due to hypotension Resolved.  Hypokalemia/Hypophosphatemia Repleted  Metabolic acidosis Likely due to delayed clearance of lactic acid-in the setting of liver failure. Improved with supportive care  Self-limited episode-small-volume coffee-ground emesis Hb stable PPI GI following-no endoscopic procedures planned at this point  Chest pain Likely atypical Troponins minimally elevated-echo with stable EF Supportive care for now  Minimally elevated troponins Trend is  flat Likely demand ischemia in the setting of hypotension/critical illness. Echo with able EF  HTN BP stable, low-dose beta-blocker and Norvasc .  History of chronic HFpEF Status stable  History of CVA Baseline left hemiparesis Antiplatelets/statin held-in the setting of elevated liver enzymes and thrombocytopenia.  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 Incidental finding on CT imaging Radiology recommending repeat CT or MRI in 12 months  Debility/deconditioning Secondary to critical illness Likely will require SNF  Hypoglycemia Likely secondary to acute liver injury and extremely poor oral intake CBGs continue to be borderline-watch closely-supportive care, staff requested to encourage patient on better oral intake and help her with her meals  Lab Results  Component Value Date   HGBA1C 5.3 09/11/2020   CBG (last 3)  Recent Labs    04/24/24 0344 04/24/24 0407 04/24/24 0740  GLUCAP 70 94 73     Pressure Ulcer: Agree with assessment and plan as outlined below Wound 04/14/24 0100 Pressure Injury Hip Left Stage 2 -  Partial thickness loss of dermis presenting as a shallow open injury with a red, pink wound bed without slough. (Active)     Wound 04/14/24 0100 Pressure Injury Buttocks Left;Right Unstageable - Full thickness tissue loss in which the base of the injury is covered by slough (yellow, tan, gray, green or brown) and/or eschar (tan, brown or black) in the wound bed. (Active)     Wound 04/14/24 0100 Pressure Injury Sacrum Medial Stage 2 -  Partial thickness loss of dermis presenting as a shallow open injury with a red, pink wound bed without slough. (Active)    Code status:   Code Status: Full Code   DVT Prophylaxis: SCDs Start: 04/13/24 2344 Avoid pharmacological prophylaxis-given severity of thrombocytopenia   Family Communication: Daughter-Tiana-[phone] left VM  12/10,12/12.12/13   Disposition Plan: Status is: Inpatient Remains inpatient appropriate because: Severity of illness   Planned Discharge Destination:Skilled nursing facility   Diet: Diet Order             DIET DYS 3 Room service appropriate? Yes with Assist; Fluid consistency: Thin  Diet  effective now                     Data Review:   Patient Lines/Drains/Airways Status     Active Line/Drains/Airways     Name Placement date Placement time Site Days   Peripheral IV 04/22/24 20 G 2.5 Anterior;Right;Upper Arm 04/22/24  2239  Arm  2   Flatus Tube/Pouch 04/21/24  --  --  3   Wound 04/14/24 0100 Pressure Injury Hip Left Stage 2 -  Partial thickness loss of dermis presenting as a shallow open injury with a red, pink wound bed without slough. 04/14/24  0100  Hip  10   Wound 04/14/24 0100 Pressure Injury Buttocks Left;Right Unstageable - Full thickness tissue loss in which the base of the injury is covered by slough (yellow, tan, gray, green or brown) and/or eschar (tan, brown or black) in the wound bed. 04/14/24  0100  Buttocks  10   Wound 04/14/24 0100 Pressure Injury Sacrum Medial Stage 2 -  Partial thickness loss of dermis presenting as a shallow open injury with a red, pink wound bed without slough. 04/14/24  0100  Sacrum  10             Inpatient Medications  Scheduled Meds:  amLODipine   10 mg Oral Daily   feeding supplement  237 mL Oral BID BM   Gerhardt's butt cream   Topical BID   lactulose   20 g Oral TID   metoprolol  tartrate  25 mg Oral BID   multivitamin with minerals  1 tablet Oral Daily   mouth rinse  15 mL Mouth Rinse 4 times per day   pantoprazole   40 mg Oral BID   [START ON 04/27/2024] romiPLOStim   3 mcg/kg Subcutaneous Weekly   Continuous Infusions:  cefTRIAXone  (ROCEPHIN )  IV 2 g (04/23/24 1357)   DAPTOmycin  700 mg (04/23/24 1315)   PRN Meds:.docusate, hydrALAZINE , HYDROcodone -acetaminophen , ipratropium-albuterol , metoprolol  tartrate, morphine   injection, ondansetron  (ZOFRAN ) IV, mouth rinse, polyethylene glycol  DVT Prophylaxis  SCDs Start: 04/13/24 2344   Recent Labs  Lab 04/21/24 0408 04/21/24 1644 04/22/24 0259 04/22/24 2342 04/23/24 0351 04/24/24 0328  WBC 4.1  --  4.2 3.8* 4.1 3.6*  HGB 7.2* 8.8* 9.1* 9.4* 9.8* 8.8*  HCT 21.7* 26.1* 27.0* 27.7* 30.1* 26.3*  PLT 19*  --  14* 42* 27* 37*  MCV 93.1  --  91.5 90.8 94.1 93.6  MCH 30.9  --  30.8 30.8 30.6 31.3  MCHC 33.2  --  33.7 33.9 32.6 33.5  RDW 16.5*  --  17.1* 17.6* 17.7* 17.7*  LYMPHSABS 1.5  --  1.5 1.2 1.2 0.6*  MONOABS 0.3  --  0.4 0.3 0.3 0.1  EOSABS 0.2  --  0.2 0.2 0.2 0.0  BASOSABS 0.0  --  0.0 0.0 0.0 0.0    Recent Labs  Lab 04/17/24 1017 04/18/24 0415 04/18/24 0415 04/19/24 0244 04/20/24 0808 04/21/24 0408 04/22/24 0259 04/22/24 2342 04/23/24 0351 04/24/24 0328  NA  --  142   < > 142   < > 131* 132* 135 134* 133*  K  --  3.4*   < > 3.9   < > 3.5 3.7 3.5 3.8 3.9  CL  --  107   < > 108   < > 101 104 105 105 106  CO2  --  26   < > 27   < > 23 24 24 24  21*  ANIONGAP  --  9   < >  7   < > 7 4* 6 5 7   GLUCOSE  --  84   < > 85   < > 74 71 93 97 70  BUN  --  11   < > 10   < > 10 9 10 11 10   CREATININE  --  0.35*   < > 0.43*   < > 0.49 0.56 0.57 0.57 0.52  AST  --  194*   < > 157*   < > 73* 67* 68* 71* 64*  ALT  --  371*   < > 313*   < > 141* 119* 103* 105* 100*  ALKPHOS  --  118   < > 127*   < > 102 99 105 110 114  BILITOT  --  1.3*   < > 0.9   < > 1.1 1.2 1.1 1.4* 1.0  ALBUMIN   --  2.1*   < > 2.2*   < > 1.6* 1.8* 1.9* 2.1* 2.2*  DDIMER 19.61*  --   --   --   --   --   --   --   --   --   INR 1.3* 1.2  --  1.1  --   --   --   --   --   --   TSH  --   --   --   --   --   --   --   --  3.370  --   BNP  --   --   --   --   --   --   --  227.1*  --   --   MG  --  1.8   < > 2.2  --  1.6* 2.0 1.7 1.8 1.9  PHOS  --  1.9*   < > 2.6  --  2.4* 2.8  --  2.4* 2.5  CALCIUM   --  8.0*   < > 8.1*   < > 7.4* 7.3* 7.4* 7.6* 8.0*   < > = values in this  interval not displayed.      Recent Labs  Lab 04/17/24 1017 04/18/24 0415 04/18/24 0415 04/19/24 0244 04/20/24 9191 04/21/24 0408 04/22/24 0259 04/22/24 2342 04/23/24 0351 04/24/24 0328  DDIMER 19.61*  --   --   --   --   --   --   --   --   --   INR 1.3* 1.2  --  1.1  --   --   --   --   --   --   TSH  --   --   --   --   --   --   --   --  3.370  --   BNP  --   --   --   --   --   --   --  227.1*  --   --   MG  --  1.8   < > 2.2  --  1.6* 2.0 1.7 1.8 1.9  CALCIUM   --  8.0*   < > 8.1*   < > 7.4* 7.3* 7.4* 7.6* 8.0*   < > = values in this interval not displayed.    --------------------------------------------------------------------------------------------------------------- Lab Results  Component Value Date   CHOL 288 (H) 05/30/2023   HDL 78 05/30/2023   LDLCALC 165 (H) 05/30/2023   TRIG 248 (H) 05/30/2023   CHOLHDL 3.7 05/30/2023    Lab Results  Component Value Date  HGBA1C 5.3 09/11/2020   Recent Labs    04/23/24 0351  TSH 3.370  FREET4 0.66*   No results for input(s): VITAMINB12, FOLATE, FERRITIN, TIBC, IRON, RETICCTPCT in the last 72 hours. ------------------------------------------------------------------------------------------------------------------ Cardiac Enzymes No results for input(s): CKMB, TROPONINI, MYOGLOBIN in the last 168 hours.  Invalid input(s): CK  Micro Results Recent Results (from the past 240 hours)  Culture, blood (Routine X 2) w Reflex to ID Panel     Status: None   Collection Time: 04/16/24 11:42 AM   Specimen: BLOOD  Result Value Ref Range Status   Specimen Description BLOOD SITE NOT SPECIFIED  Final   Special Requests   Final    BOTTLES DRAWN AEROBIC AND ANAEROBIC Blood Culture adequate volume   Culture   Final    NO GROWTH 5 DAYS Performed at [facility], [address] [place], [place] [address]    Report Status 04/21/2024 FINAL  Final  Culture, blood (Routine X 2) w Reflex to ID Panel      Status: None   Collection Time: 04/16/24  2:56 PM   Specimen: BLOOD RIGHT ARM  Result Value Ref Range Status   Specimen Description BLOOD RIGHT ARM  Final   Special Requests   Final    BOTTLES DRAWN AEROBIC AND ANAEROBIC Blood Culture adequate volume   Culture   Final    NO GROWTH 5 DAYS Performed at [facility], [address], [place], [place] [address]    Report Status 04/21/2024 FINAL  Final    Radiology Reports  DG Chest Port 1 View Result Date: 04/22/2024 EXAM: 1 VIEW(S) XRAY OF THE CHEST 04/22/2024 10:41:00 PM COMPARISON: 04/13/2024 CLINICAL HISTORY: Tachycardia. FINDINGS: LUNGS AND PLEURA: Right mid lung zone linear atelectasis. Left hemidiaphragm calcified pleural plaque. Elevated right hemidiaphragm. No pleural effusion. No pneumothorax. HEART AND MEDIASTINUM: Aortic atherosclerosis. No acute abnormality of the cardiac and mediastinal silhouettes. BONES AND SOFT TISSUES: No acute osseous abnormality. IMPRESSION: 1. No acute cardiopulmonary process. 2. Right mid lung linear atelectasis. 3. Calcified pleural plaque along the left hemidiaphragm. Electronically signed by: [name] 04/22/2024 10:44 PM EST RP Workstation: [id number]      Signature  -   Lavada Stank M.D on 04/24/2024 at 9:12 AM   -  To page go to [email or web address]

## 2024-04-24 NOTE — Plan of Care (Signed)
  Problem: Clinical Measurements: Goal: Cardiovascular complication will be avoided Outcome: Progressing   Problem: Safety: Goal: Ability to remain free from injury will improve Outcome: Progressing   

## 2024-04-24 NOTE — TOC Progression Note (Signed)
 Transition of Care [facility]) - Progression Note    Patient Details  Name: [name] MRN: [id number] Date of Birth: [birth year]  [facility]) CM/SW Contact  [name], LCSW Phone Number: 04/24/2024, 8:53 AM  Clinical Narrative:    CSW continuing to follow for medical stability.    Expected Discharge Plan: Skilled Nursing Facility Barriers to Discharge: Continued Medical Work up               Expected Discharge Plan and Services In-house Referral: Clinical Social Work   Post Acute Care Choice: Skilled Nursing Facility Living arrangements for the past 2 months: Single Family Home                                       Social Drivers of Health (SDOH) Interventions SDOH Screenings   Food Insecurity: Food Insecurity Present (04/15/2024)  Housing: High Risk (04/22/2024)  Transportation Needs: Unmet Transportation Needs (04/15/2024)  Utilities: At Risk (04/15/2024)  Depression (PHQ2-9): Low Risk (06/04/2021)  Social Connections: Unknown (04/15/2024)  Tobacco Use: Medium Risk (03/22/2024)    Readmission Risk Interventions    01/15/2024    2:24 PM  Readmission Risk Prevention Plan  Transportation Screening Complete  HRI or Home Care Consult Complete  Social Work Consult for Recovery Care Planning/Counseling Complete  Palliative Care Screening Not Applicable  Medication Review Oceanographer) Complete

## 2024-04-24 NOTE — Plan of Care (Signed)
   Problem: Nutrition: Goal: Adequate nutrition will be maintained Outcome: Progressing

## 2024-04-24 NOTE — Progress Notes (Signed)
 [name]   DOB:Aug 09, 1945   FM#:[id number]      ASSESSMENT & PLAN:  [name] is an 78 year old female patient admitted on 04/13/2024 with complaints of altered mental status.  Hematologic history is significant for thrombocytopenia and anemia.  Therefore hematology is following.   Thrombocytopenia, worsening -Platelet count 27K today.   - Platelets with initial response status post platelet transfusion.  Continues to downtrend despite IVIG x 2 days and 1 dose Nplate  given 04/21/2024. - May be secondary to acute illness and infection.  Immature platelet count is not elevated which does not support ITP although ITP is not ruled out.  TTP is ruled out. - Patient has history of thrombocytopenia and was seen in outpatient hematology office on 12/11/2023 for her platelets were noted to be mildly low 80K at that time. - Platelets smear WNL - Hold on steroids at this time given possible endocarditis. - Status post IVIG 1 g/kg x 2 days on 12/12 and 12/13. - Status post Nplate  given 04/21/2024.  Will plan Nplate  3 mcg/kg to be given 04/27/2024. - Recommend platelet transfusion for platelet count <20 K or <50 K with acute bleeding. - No bleeding noted at this time. - Continue to monitor closely for bleeding - Continue to monitor CBC with differential - Hematology/Dr. Lanny following.   Altered mental status - CT of head is negative - Likely secondary to liver failure and comorbidities - Appears to be improving. - Continue supportive care   Anemia History of GI bleed - Hemoglobin stable 9.8 today - Patient admitted August 2025 with GI bleed.  Hemoglobin dropped to 5 and she received PRBC transfusions. - Recommend PRBC transfusion for hemoglobin <7.0 - Continue to monitor CBC with differential   Acute liver failure - Unclear etiology. - Dopplers negative for thrombosis, showed steatosis. - LFTs continue to improve.  Bili slightly elevated. - Avoid hepatotoxic agents - Monitor hepatic  function  Infectious endocarditis - Cardio following closely.  Plans for TEE after platelets have improved.   History of B12 deficiency - Received B12 injections August 2025.  Recommended at that time continuation of weekly B12 injections however patient no-showed for outpatient appointments.   History of CVA - Denies headaches or other acute pain - Continue supportive care        Code Status Full   Subjective:  Patient seen sleeping comfortably in bed.  Wakes up to tactile stimuli.  Very hard of hearing.  Denies acute pain or bleeding.  Patient became tearful during discussion that her children have not come to visit.  No acute complaints offered.  Objective:   Intake/Output Summary (Last 24 hours) at 04/24/2024 0821 Last data filed at 04/24/2024 0354 Gross per 24 hour  Intake 830 ml  Output --  Net 830 ml     PHYSICAL EXAMINATION: ECOG PERFORMANCE STATUS: 4 - Bedbound  Vitals:   04/24/24 0400 04/24/24 0741  BP: (!) 142/62 (!) 150/73  Pulse: (!) 54 60  Resp:  (!) 22  Temp:  98.7 F (37.1 C)  SpO2:  97%   Filed Weights   04/21/24 0500 04/22/24 0500 04/23/24 0500  Weight: 181 lb 10.5 oz (82.4 kg) 183 lb 6.8 oz (83.2 kg) 176 lb 2.4 oz (79.9 kg)    GENERAL: alert, no distress and comfortable + chronically ill-appearing + very hard of hearing SKIN: + Decubiti left hip and sacrum EYES: normal, conjunctiva are pink and non-injected, sclera clear OROPHARYNX: no exudate, no erythema and lips, buccal mucosa, and tongue  normal  NECK: supple, thyroid  normal size, non-tender, without nodularity LYMPH: no palpable lymphadenopathy in the cervical, axillary or inguinal LUNGS: clear to auscultation and percussion with normal breathing effort HEART: regular rate & rhythm and no murmurs and no lower extremity edema ABDOMEN: abdomen soft, non-tender and normal bowel sounds MUSCULOSKELETAL: + Ambulatory dysfunction PSYCH: alert & oriented x 3 with fluent speech NEURO: no  focal motor/sensory deficits   All questions were answered. The patient knows to call the clinic with any problems, questions or concerns.   The total time spent in the appointment was 40 minutes encounter with patient including review of chart and various tests results, discussions about plan of care and coordination of care plan  Debra Mattock, MD 04/24/2024 8:21 AM    Labs Reviewed:  Lab Results  Component Value Date   WBC 3.6 (L) 04/24/2024   HGB 8.8 (L) 04/24/2024   HCT 26.3 (L) 04/24/2024   MCV 93.6 04/24/2024   PLT 37 (L) 04/24/2024   Recent Labs    04/14/24 0957 04/14/24 1958 04/15/24 0248 04/22/24 2342 04/23/24 0351 04/24/24 0328  NA 146* 143   < > 135 134* 133*  K 2.7* 3.5   < > 3.5 3.8 3.9  CL 110 110   < > 105 105 106  CO2 18* 20*   < > 24 24 21*  GLUCOSE 88 167*   < > 93 97 70  BUN 42* 40*   < > 10 11 10   CREATININE 1.59* 1.43*   < > 0.57 0.57 0.52  CALCIUM  7.4* 7.4*   < > 7.4* 7.6* 8.0*  GFRNONAA 33* 37*   < > >60 >60 >60  PROT 5.4* 5.3*   < > 7.3 7.8 6.9  ALBUMIN  1.9* 1.9*   < > 1.9* 2.1* 2.2*  AST 2,619* 1,795*   < > 68* 71* 64*  ALT 1,437* 1,236*   < > 103* 105* 100*  ALKPHOS 82 82   < > 105 110 114  BILITOT 2.2* 2.0*   < > 1.1 1.4* 1.0  BILIDIR 1.0* 1.0*  --   --   --   --   IBILI 1.2* 1.0*  --   --   --   --    < > = values in this interval not displayed.    Studies Reviewed:   DG Chest Port 1 View Result Date: 04/22/2024 EXAM: 1 VIEW(S) XRAY OF THE CHEST 04/22/2024 10:41:00 PM COMPARISON: 04/13/2024 CLINICAL HISTORY: Tachycardia. FINDINGS: LUNGS AND PLEURA: Right mid lung zone linear atelectasis. Left hemidiaphragm calcified pleural plaque. Elevated right hemidiaphragm. No pleural effusion. No pneumothorax. HEART AND MEDIASTINUM: Aortic atherosclerosis. No acute abnormality of the cardiac and mediastinal silhouettes. BONES AND SOFT TISSUES: No acute osseous abnormality. IMPRESSION: 1. No acute cardiopulmonary process. 2. Right mid lung linear  atelectasis. 3. Calcified pleural plaque along the left hemidiaphragm. Electronically signed by: [name] 04/22/2024 10:44 PM EST RP Workstation: [id number]   US  ABD LTD RUG W/LIVER DOPPLER Result Date: 04/16/2024 EXAM: RIGHT UPPER QUADRANT ULTRASOUND WITH DOPPLER EVALUATION 04/15/2024 11:50:00 PM COMPARISON: Right upper quadrant ultrasound 04/14/2024. CT 04/13/2024. CLINICAL HISTORY: Elevated LFTs. FINDINGS: LIVER: Increased echotexture throughout the liver is compatible with fatty infiltration. No evidence of intrahepatic biliary ductal dilatation. OTHER: No evidence of right upper quadrant ascites. Vascular/Doppler: Doppler interrogation of the abdominal vasculature was performed. There is normal Doppler flow in the normal direction of the hepatic vasculature. Normal hepatopetal flow of the hepatic artery and portal vein. Normal  hepatofugal flow of the hepatic veins. Normal Doppler waveforms are visualized. IMPRESSION: 1. Hepatic steatosis. 2. No evidence of hepatic vein or portal vein thrombosis. Electronically signed by: Franky Crease MD 04/16/2024 01:23 AM EST RP Workstation: [id number]   DG Swallowing Func-Speech Pathology Result Date: 04/15/2024 Table formatting from the original result was not included. Modified Barium Swallow Study Patient Details Name: [name] MRN: [id number] Date of Birth: 05-22-1945 Today's Date: 04/15/2024 HPI/PMH: HPI: [name] is a 78 y/o female presenting 04/14/24 after roommate called EMS as the patient was unresponsive. She reports chest pain and was very confused. Pt found under single blanket, alone with no heat or lights on in home.   PMHx:  HTN, HLD, CVA left hemiparesis, CAPD, DVT, IVH. UA positive. SLP consulted for clinical swallow assessment. Clinical Impression: Pt presents with a mild-moderate oropharyngeal dysphagia per MBSS completed today. Recommend a mechanical altered/ground diet with nectar-thick liquids (straw ok), and meds crushed in applesauce. Oral  deficits included reduced oral strength and coordination resulting in intermittent anterior loss of liquids by spoon or cup sip. Pt impulsively drank all liquids and was unable to try single sips despite cues. There was consistent posterior loss of thin and nectar-thick liquids to the level of the pyriform sinuses pre swallow with some pooling. There was piecemeal swallow of pudding and softened cracker. Min cracker residue on hard palate cleared with prompted nectar-thick liquid wash. Pharyngeal deficits included reduced base of tongue retraction, reduced laryngeal vestibule closure, reduced pharyngeal stripping, and reduced distention of UES. Findings -There was transient aspiration of thin liquid residue during cleansing swallows. Residue was ejected to the level of the vocal cords (PAS-5) or the laryngeal vestibule (PAS-3) during swallow, which was inaudible. No stagnant aspiration in the airway observed. -There x1 instance of stagnant penetration of nectar-thick liquid wash post cracker. Penetrated residue did start to progress to level of the vocal cords (PAS3 - 5); though ejected with cued throat clear + swallow. All other penetration instances of nectar-thick liquids were transient. -There was min diffuse pharyngeal residue following liquids and pudding. A moderate amount of pharyngeal residue observed post cracker trial, which was cleared with cued dry swallow. Plan: SLP will follow up to assess diet tolerance and modify diet as indicated. Pt may benefit from a repeat MBSS prior to liquid advancement given that inaudible responses to penetration/aspiration events. Factors that may increase risk of adverse event in presence of aspiration Noe & Lianne 2021): Factors that may increase risk of adverse event in presence of aspiration Noe & Lianne 2021): Reduced cognitive function; Limited mobility; Frail or deconditioned; Dependence for feeding and/or oral hygiene; Inadequate oral hygiene; Reduced  saliva; Weak cough Recommendations/Plan: Swallowing Evaluation Recommendations Swallowing Evaluation Recommendations Recommendations: PO diet PO Diet Recommendation: Dysphagia 2 (Finely chopped); Mildly thick liquids (Level 2, nectar thick) Liquid Administration via: Cup; Straw Medication Administration: Crushed with puree Supervision: Staff to assist with self-feeding; Full supervision/cueing for swallowing strategies Swallowing strategies  : Minimize environmental distractions; Slow rate; Small bites/sips; Clear throat intermittently; Follow solids with liquids Postural changes: Position pt fully upright for meals Oral care recommendations: Oral care BID (2x/day) Treatment Plan Treatment Plan Treatment recommendations: Therapy as outlined in treatment plan below Follow-up recommendations: Skilled nursing-short term rehab (<3 hours/day) Functional status assessment: Patient has had a recent decline in their functional status and demonstrates the ability to make significant improvements in function in a reasonable and predictable amount of time. Treatment frequency: Min 1x/week Treatment duration: 2 weeks Interventions: Diet toleration  management by SLP; Trials of upgraded texture/liquids; Patient/family education; Compensatory techniques Recommendations Recommendations for follow up therapy are one component of a multi-disciplinary discharge planning process, led by the attending physician.  Recommendations may be updated based on patient status, additional functional criteria and insurance authorization. Assessment: Orofacial Exam: Orofacial Exam Oral Cavity: Oral Hygiene: Dried secretions Oral Cavity - Dentition: Poor condition; Missing dentition Orofacial Anatomy: WFL Oral Motor/Sensory Function: Unable to test Anatomy: Anatomy: WFL Boluses Administered: Boluses Administered Boluses Administered: Thin liquids (Level 0); Mildly thick liquids (Level 2, nectar thick); Puree; Solid  Oral Impairment Domain: Oral  Impairment Domain Lip Closure: Escape from interlabial space or lateral juncture, no extension beyond vermillion border Tongue control during bolus hold: Posterior escape of greater than half of bolus Bolus preparation/mastication: Disorganized chewing/mashing with solid pieces of bolus unchewed Bolus transport/lingual motion: Slow tongue motion Oral residue: Residue collection on oral structures Location of oral residue : Tongue Initiation of pharyngeal swallow : Pyriform sinuses  Pharyngeal Impairment Domain: Pharyngeal Impairment Domain Soft palate elevation: No bolus between soft palate (SP)/pharyngeal wall (PW) Laryngeal elevation: Complete superior movement of thyroid  cartilage with complete approximation of arytenoids to epiglottic petiole Anterior hyoid excursion: Complete anterior movement Epiglottic movement: Complete inversion Laryngeal vestibule closure: Incomplete, narrow column air/contrast in laryngeal vestibule Pharyngeal stripping wave : Present - diminished Pharyngeal contraction (A/P view only): N/A Pharyngoesophageal segment opening: Partial distention/partial duration, partial obstruction of flow Tongue base retraction: Narrow column of contrast or air between tongue base and PPW Pharyngeal residue: Collection of residue within or on pharyngeal structures Location of pharyngeal residue: Tongue base; Valleculae; Pyriform sinuses  Esophageal Impairment Domain: Esophageal Impairment Domain Esophageal clearance upright position: Esophageal retention Pill: No data recorded Penetration/Aspiration Scale Score: Penetration/Aspiration Scale Score 1.  Material does not enter airway: Puree; Solid 2.  Material enters airway, remains ABOVE vocal cords then ejected out: Mildly thick liquids (Level 2, nectar thick) 3.  Material enters airway, remains ABOVE vocal cords and not ejected out: Mildly thick liquids (Level 2, nectar thick); Thin liquids (Level 0) 5.  Material enters airway, CONTACTS cords and not  ejected out: Thin liquids (Level 0) 6.  Material enters airway, passes BELOW cords then ejected out: Thin liquids (Level 0) Compensatory Strategies: Compensatory Strategies Compensatory strategies: Yes Multiple swallows: Effective (when pt able to follow cue) Effective Multiple Swallows: Solid Chin tuck: -- (unable to attempt) Other(comment): Effective (throat clear+ swallow to eject penetration) Effective Other(comment): Mildly thick liquid (Level 2, nectar thick)   General Information: Caregiver present: No  Diet Prior to this Study: NPO   Temperature : Normal   Respiratory Status: WFL   Supplemental O2: None (Room air)   History of Recent Intubation: No  Behavior/Cognition: Alert; Cooperative [facility]) Self-Feeding Abilities: Needs set-up for self-feeding Baseline vocal quality/speech: Hypophonia/low volume Volitional Cough: Unable to elicit (occasional throat clear elicited) Volitional Swallow: Able to elicit (delayed) No data recorded Goal Planning: Prognosis for improved oropharyngeal function: Fair No data recorded No data recorded Patient/Family Stated Goal: none stated Consulted and agree with results and recommendations: Patient; Nurse Pain: Pain Assessment Pain Assessment: No/denies pain Pain Score: 0 Faces Pain Scale: 2 Facial Expression: 0 Body Movements: 0 Muscle Tension: 0 Compliance with ventilator (intubated pts.): N/A Vocalization (extubated pts.): 0 CPOT Total: 0 Pain Location: grimacing with mobility tasks Pain Descriptors / Indicators: Grimacing End of Session: Start Time:SLP Start Time (ACUTE ONLY): 1438 Stop Time: SLP Stop Time (ACUTE ONLY): 1500 Time Calculation:SLP Time Calculation (min) (ACUTE ONLY): 22 min  Charges: SLP Evaluations $ SLP Speech Visit: 1 Visit SLP Evaluations $MBS Swallow: 1 Procedure SLP visit diagnosis: SLP Visit Diagnosis: Dysphagia, oropharyngeal phase (R13.12) Past Medical History: Past Medical History: Diagnosis Date  Acute ischemic cerebrovascular accident (CVA) involving  middle cerebral artery territory (HCC) 09/16/2020  COPD (chronic obstructive pulmonary disease) (HCC)   History of DVT (deep vein thrombosis)   Hypertension   Thoracoabdominal aortic aneurysm   TIA (transient ischemic attack) 09/10/2020 Past Surgical History: Past Surgical History: Procedure Laterality Date  IR BONE MARROW BIOPSY & ASPIRATION  12/13/2023  IR CT HEAD LTD  09/11/2020  IR INTRAVSC STENT CERV CAROTID W/O EMB-PROT MOD SED INC ANGIO  09/11/2020  IR PERCUTANEOUS ART THROMBECTOMY/INFUSION INTRACRANIAL INC DIAG ANGIO  09/11/2020     IR PERCUTANEOUS ART THROMBECTOMY/INFUSION INTRACRANIAL INC DIAG ANGIO  09/11/2020  IR US  GUIDE VASC ACCESS RIGHT  09/11/2020  NO PAST SURGERIES    RADIOLOGY WITH ANESTHESIA N/A 09/11/2020  Procedure: IR WITH ANESTHESIA;  Surgeon: Dolphus Carrion, MD;  Location: [facility] OR;  Service: Radiology;  Laterality: N/A; [name] 04/15/2024, 3:33 PM  ECHOCARDIOGRAM COMPLETE Result Date: 04/15/2024    ECHOCARDIOGRAM REPORT   Patient Name:   [name] Date of Exam: 04/15/2024 Medical Rec #:  [id number]   Height:       70.0 in Accession #:    [id number]  Weight:       164.2 lb Date of Birth:  13-Jul-1945  BSA:          1.920 m Patient Age:    78 years    BP:           134/57 mmHg Patient Gender: F           HR:           56 bpm. Exam Location:  Inpatient Procedure: 2D Echo, Cardiac Doppler and Color Doppler (Both Spectral and Color            Flow Doppler were utilized during procedure). Indications:    Shock  History:        Patient has prior history of Echocardiogram examinations, most                 recent 01/12/2024. Stroke, Signs/Symptoms:Altered Mental Status;                 Risk Factors:Dyslipidemia and Hypertension. ICH.  Sonographer:    Ellouise Mose RDCS Referring Phys: [phone] RAKESH V ALVA IMPRESSIONS  1. Left ventricular ejection fraction, by estimation, is 50 to 55%. Left ventricular ejection fraction by 2D MOD biplane is 51.5 %. Left ventricular ejection fraction by PLAX is 43 %. The left  ventricle has low normal function. The left ventricle demonstrates global hypokinesis. There is moderate left ventricular hypertrophy. Left ventricular diastolic parameters are consistent with Grade I diastolic dysfunction (impaired relaxation).  2. Right ventricular systolic function is normal. The right ventricular size is normal. Tricuspid regurgitation signal is inadequate for assessing PA pressure.  3. The mitral valve is degenerative. Trivial mitral valve regurgitation. No evidence of mitral stenosis.  4. Calcified mass on the non-coronary cusp with some mobile material, measures 0.5 x 1.0 cm, could be old vegetation or thrombus. The aortic valve is tricuspid. There is severe thickening of the aortic valve. Aortic valve regurgitation is not visualized. Aortic valve sclerosis/calcification is present, without any evidence of aortic stenosis.  5. Aortic dilatation noted. There is mild dilatation of the ascending aorta, measuring 40 mm.  6. The inferior  vena cava is dilated in size with <50% respiratory variability, suggesting right atrial pressure of 15 mmHg. Comparison(s): Changes from prior study are noted. 01/12/2024: LVEF 60-65%. FINDINGS  Left Ventricle: Left ventricular ejection fraction, by estimation, is 50 to 55%. Left ventricular ejection fraction by PLAX is 43 %. Left ventricular ejection fraction by 2D MOD biplane is 51.5 %. The left ventricle has low normal function. The left ventricle demonstrates global hypokinesis. 3D ejection fraction reviewed and evaluated as part of the interpretation. Alternate measurement of EF is felt to be most reflective of LV function. The left ventricular internal cavity size was normal in size. There is moderate left ventricular hypertrophy. Left ventricular diastolic parameters are consistent with Grade I diastolic dysfunction (impaired relaxation). Indeterminate filling pressures. Right Ventricle: The right ventricular size is normal. No increase in right ventricular  wall thickness. Right ventricular systolic function is normal. Tricuspid regurgitation signal is inadequate for assessing PA pressure. Left Atrium: Left atrial size was normal in size. Right Atrium: Right atrial size was normal in size. Pericardium: There is no evidence of pericardial effusion. Mitral Valve: The mitral valve is degenerative in appearance. Mild to moderate mitral annular calcification. Trivial mitral valve regurgitation. No evidence of mitral valve stenosis. Tricuspid Valve: The tricuspid valve is normal in structure. Tricuspid valve regurgitation is not demonstrated. No evidence of tricuspid stenosis. Aortic Valve: Calcified mass on the non-coronary cusp with some mobile material, measures 0.5 x 1.0 cm, could be old vegetation or thrombus. The aortic valve is tricuspid. There is severe thickening of the aortic valve. Aortic valve regurgitation is not visualized. Aortic valve sclerosis/calcification is present, without any evidence of aortic stenosis. Pulmonic Valve: The pulmonic valve was normal in structure. Pulmonic valve regurgitation is not visualized. No evidence of pulmonic stenosis. Aorta: Aortic dilatation noted. There is mild dilatation of the ascending aorta, measuring 40 mm. Venous: The inferior vena cava is dilated in size with less than 50% respiratory variability, suggesting right atrial pressure of 15 mmHg. IAS/Shunts: No atrial level shunt detected by color flow Doppler. Additional Comments: 3D was performed not requiring image post processing on an independent workstation and was abnormal.  LEFT VENTRICLE PLAX 2D                        Biplane EF (MOD) LV EF:         Left            LV Biplane EF:   Left                ventricular                      ventricular                ejection                         ejection                fraction by                      fraction by                PLAX is 43                       2D MOD                %.  biplane is LVIDd:         4.70 cm                          51.5 %. LVIDs:         3.70 cm LV PW:         1.50 cm         Diastology LV IVS:        1.50 cm         LV e' medial:    3.81 cm/s LVOT diam:     2.30 cm         LV E/e' medial:  11.5 LV SV:         87              LV e' lateral:   3.70 cm/s LV SV Index:   45              LV E/e' lateral: 11.9 LVOT Area:     4.15 cm  LV Volumes (MOD) LV vol d, MOD    96.7 ml       3D Volume EF: A2C:                           3D EF:        46 % LV vol d, MOD    81.7 ml       LV EDV:       174 ml A4C:                           LV ESV:       95 ml LV vol s, MOD    44.3 ml       LV SV:        79 ml A2C: LV vol s, MOD    42.3 ml A4C: LV SV MOD A2C:   52.4 ml LV SV MOD A4C:   81.7 ml LV SV MOD BP:    46.9 ml RIGHT VENTRICLE             IVC RV S prime:     17.70 cm/s  IVC diam: 2.10 cm TAPSE (M-mode): 1.9 cm                             PULMONARY VEINS                             Diastolic Velocity: 27.90 cm/s                             S/D Velocity:       1.60                             Systolic Velocity:  44.10 cm/s LEFT ATRIUM             Index        RIGHT ATRIUM           Index LA diam:        3.90 cm 2.03 cm/m   RA Area:     11.40 cm LA Vol (A2C):   39.3 ml 20.47 ml/m  RA  Volume:   24.10 ml  12.55 ml/m LA Vol (A4C):   42.8 ml 22.30 ml/m LA Biplane Vol: 41.7 ml 21.72 ml/m  AORTIC VALVE LVOT Vmax:   100.00 cm/s LVOT Vmean:  58.600 cm/s LVOT VTI:    0.210 m  AORTA Ao Root diam: 3.30 cm Ao Asc diam:  3.95 cm MITRAL VALVE MV Area (PHT): 2.32 cm    SHUNTS MV Decel Time: 327 msec    Systemic VTI:  0.21 m MV E velocity: 44.00 cm/s  Systemic Diam: 2.30 cm MV A velocity: 89.70 cm/s MV E/A ratio:  0.49 Vinie Maxcy MD Electronically signed by Vinie Maxcy MD Signature Date/Time: 04/15/2024/3:29:26 PM    Final    DG Abd Portable 1V Result Date: 04/14/2024 EXAM: 1 VIEW XRAY OF THE ABDOMEN 04/14/2024 06:02:00 PM COMPARISON: Same day knees. CLINICAL HISTORY: Confusion.  FINDINGS: LINES, TUBES AND DEVICES: Nasogastric tube tip has been advanced into body of stomach and is in grossly good position. BOWEL: Nonobstructive bowel gas pattern. SOFT TISSUES: No abnormal calcifications. BONES: No acute fracture. IMPRESSION: 1. Nasogastric tube tip projects over the body of the stomach in expected position. Electronically signed by: Lynwood Seip MD 04/14/2024 06:13 PM EST RP Workstation: [id number]   DG Abd 1 View Result Date: 04/14/2024 EXAM: 1 VIEW XRAY OF THE ABDOMEN 04/14/2024 12:00:00 PM COMPARISON: None available. CLINICAL HISTORY: Nasogastric tube present. FINDINGS: LINES, TUBES AND DEVICES: Enteric tube in place with tip projecting over the stomach. Side port projecting over the distal esophagus. BOWEL: Nonobstructive bowel gas pattern. SOFT TISSUES: Atherosclerotic calcifications and abdominal aortic aneurysm. BONES: No acute fracture. IMPRESSION: 1. Enteric tube in place with tip projecting over the stomach and side port projecting over the distal esophagus. Recommend advancing the tube so the side port is beyond the gastroesophageal junction. 2. Abdominal aortic aneurysm with atherosclerotic calcifications. This is described in greater detail on CT of the abdomen and pelvis without contrast 04/13/2024 . Electronically signed by: Lonni Necessary MD 04/14/2024 04:27 PM EST RP Workstation: [id number]   US  RENAL Result Date: 04/14/2024 EXAM: US  Retroperitoneum Complete, Renal. 04/14/2024 12:20:00 PM TECHNIQUE: Real-time ultrasonography of the retroperitoneum renal was performed. COMPARISON: None available CLINICAL HISTORY: Acute kidney failure. FINDINGS: FINDINGS: RIGHT KIDNEY/URETER: Right kidney measures 10.5 x 5.5 x 5.0 cm. Normal cortical echogenicity. No hydronephrosis. No calculus. No mass. LEFT KIDNEY/URETER: Left kidney measures 10.5 x 5.3 x 4.9 cm. Normal cortical echogenicity. No hydronephrosis. No calculus. No mass. BLADDER: Urinary bladder is decompressed  secondary to foley catheter. IMPRESSION: 1. No acute findings. Electronically signed by: Lynwood Seip MD 04/14/2024 12:50 PM EST RP Workstation: [id number]   US  Abdomen Limited RUQ (LIVER/GB) Result Date: 04/14/2024 EXAM: Right Upper Quadrant Abdominal Ultrasound TECHNIQUE: Real-time ultrasonography of the right upper quadrant of the abdomen was performed. COMPARISON: None available. CLINICAL HISTORY: LFT elevation. FINDINGS: LIVER: Increased hepatic steatosis. No intrahepatic biliary ductal dilatation. No mass. BILIARY SYSTEM: No evidence of pericholecystic fluid or wall thickening. No cholelithiasis. Common bile duct is within normal limits measuring 2.3 mm. RIGHT KIDNEY: The right kidney is grossly unremarkable in appearances without evidence of hydronephrosis, echogenic calculi or worrisome mass lesions. OTHER: No right upper quadrant ascites. IMPRESSION: 1. Hepatic steatosis. Electronically signed by: Morgane Naveau MD 04/14/2024 12:40 AM EST RP Workstation: [id number]   DG Chest Port 1 View Result Date: 04/13/2024 EXAM: 1 VIEW(S) XRAY OF THE CHEST 04/13/2024 08:39:00 PM COMPARISON: 01/11/2024. CLINICAL HISTORY: Questionable sepsis - evaluate for abnormality FINDINGS: LUNGS AND PLEURA: Left pleural calcifications noted. No  focal pulmonary opacity. No pleural effusion. No pneumothorax. HEART AND MEDIASTINUM: Aortic atherosclerosis. BONES AND SOFT TISSUES: No acute osseous abnormality. IMPRESSION: 1. No acute cardiopulmonary abnormality. 2. Left pleural calcifications. 3. Aortic atherosclerosis. Electronically signed by: Franky Crease MD 04/13/2024 09:40 PM EST RP Workstation: [id number]   CT CHEST ABDOMEN PELVIS WO CONTRAST Result Date: 04/13/2024 EXAM: CT CHEST, ABDOMEN AND PELVIS WITHOUT CONTRAST 04/13/2024 08:59:36 PM TECHNIQUE: CT of the chest, abdomen and pelvis was performed without the administration of intravenous contrast. Multiplanar reformatted images are provided for review. Automated exposure  control, iterative reconstruction, and/or weight based adjustment of the mA/kV was utilized to reduce the radiation dose to as low as reasonably achievable. COMPARISON: 01/12/2024 CLINICAL HISTORY: abd pain FINDINGS: CHEST: MEDIASTINUM AND LYMPH NODES: Heart and pericardium are unremarkable. Diffuse coronary artery and aortic atherosclerosis. The central airways are clear. No mediastinal, hilar or axillary lymphadenopathy. LUNGS AND PLEURA: Like scarring in the left lower lobe posteriorly. Calcified pleural plaques in the left hemithorax, stable. No focal consolidation or pulmonary edema. No pleural effusion or pneumothorax. ABDOMEN AND PELVIS: LIVER: The liver is unremarkable. GALLBLADDER AND BILE DUCTS: Gallbladder is unremarkable. No biliary ductal dilatation. SPLEEN: No acute abnormality. PANCREAS: No acute abnormality. ADRENAL GLANDS: No acute abnormality. KIDNEYS, URETERS AND BLADDER: Foley catheter in the bladder, which is decompressed. No stones in the kidneys or ureters. No hydronephrosis. No perinephric or periureteral stranding. GI AND BOWEL: Stomach demonstrates no acute abnormality. Normal appendix. There is no bowel obstruction. REPRODUCTIVE ORGANS: No acute abnormality. PERITONEUM AND RETROPERITONEUM: No ascites. No free air. VASCULATURE: Infrarenal abdominal aortic aneurysm measures up to 4.2 cm. 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 Diffuse aortoiliac atherosclerosis. ABDOMINAL AND PELVIS LYMPH NODES: No lymphadenopathy. BONES AND SOFT TISSUES: No acute osseous abnormality. No focal soft tissue abnormality. IMPRESSION: 1. 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 Diffuse aortoiliac atherosclerosis; recommend CT or MRI in 12 months and establish or continue care with a vascular specialist. 2. Coronary artery disease. 3. Calcified pleural plaques on the left. Scarring at the left lung base.  Electronically signed by: Franky Crease MD 04/13/2024 09:06 PM EST RP Workstation: [id number]   CT Head Wo Contrast Result Date: 04/13/2024 EXAM: CT HEAD WITHOUT CONTRAST 04/13/2024 08:58:58 PM TECHNIQUE: CT of the head was performed without the administration of intravenous contrast. Automated exposure control, iterative reconstruction, and/or weight based adjustment of the mA/kV was utilized to reduce the radiation dose to as low as reasonably achievable. COMPARISON: 12/10/2023 CLINICAL HISTORY: delirium FINDINGS: BRAIN AND VENTRICLES: No acute hemorrhage. No evidence of acute infarct. Age-related cerebral and cerebellar volume loss. Remote encephalomalacia in right parietal lobe. Mild periventricular white matter disease. Ex vacuo dilatation of right lateral ventricle. No extra-axial collection. No mass effect or midline shift. Moderate calcific atheromatous disease within carotid siphons and vertebral arteries. ORBITS: No acute abnormality. SINUSES: Moderate bilateral ethmoid sinus disease. Bilateral maxillary sinus disease with air-fluid levels. Mild bilateral sphenoid and frontal sinus disease. SOFT TISSUES AND SKULL: No acute soft tissue abnormality. No skull fracture. IMPRESSION: 1. No acute intracranial abnormality. 2. Stable right parietal cortical encephalomalacia. 3. Stable age-related changes. 4. Bilateral maxillary sinus disease with air-fluid levels, with additional ethmoid, sphenoid, and frontal sinus involvement, suggestive of acute sinusitis. Electronically signed by: Dorethia Molt MD 04/13/2024 09:05 PM EST RP Workstation: [id number]    Addendum I have seen the patient, examined her. I agree with the assessment and  and plan and have edited the notes.   Pt is clinically stable without any bleeding. She responded to plt transfusion on MOnday, but plt is trending down again. We plan to give second dose Nplate  this Friday with increased dose. She has BM biopsy in 12/2023 which was negative for  primary BM disease. I think her thrombocytopenia is related to her sepsis but ITP is also a possibility. Will continue f/u.  Debra Mattock MD 04/23/2024

## 2024-04-25 ENCOUNTER — Inpatient Hospital Stay (HOSPITAL_COMMUNITY)

## 2024-04-25 DIAGNOSIS — I471 Supraventricular tachycardia, unspecified: Secondary | ICD-10-CM | POA: Diagnosis not present

## 2024-04-25 LAB — CBC WITH DIFFERENTIAL/PLATELET
Abs Immature Granulocytes: 0.01 K/uL (ref 0.00–0.07)
Basophils Absolute: 0 K/uL (ref 0.0–0.1)
Basophils Relative: 1 %
Eosinophils Absolute: 0.2 K/uL (ref 0.0–0.5)
Eosinophils Relative: 5 %
HCT: 24.6 % — ABNORMAL LOW (ref 36.0–46.0)
Hemoglobin: 8.1 g/dL — ABNORMAL LOW (ref 12.0–15.0)
Immature Granulocytes: 0 %
Lymphocytes Relative: 45 %
Lymphs Abs: 1.4 K/uL (ref 0.7–4.0)
MCH: 30.9 pg (ref 26.0–34.0)
MCHC: 32.9 g/dL (ref 30.0–36.0)
MCV: 93.9 fL (ref 80.0–100.0)
Monocytes Absolute: 0.3 K/uL (ref 0.1–1.0)
Monocytes Relative: 9 %
Neutro Abs: 1.3 K/uL — ABNORMAL LOW (ref 1.7–7.7)
Neutrophils Relative %: 40 %
Platelets: 38 K/uL — ABNORMAL LOW (ref 150–400)
RBC: 2.62 MIL/uL — ABNORMAL LOW (ref 3.87–5.11)
RDW: 18.1 % — ABNORMAL HIGH (ref 11.5–15.5)
Smear Review: NORMAL
WBC: 3.1 K/uL — ABNORMAL LOW (ref 4.0–10.5)
nRBC: 1 % — ABNORMAL HIGH (ref 0.0–0.2)

## 2024-04-25 LAB — GLUCOSE, CAPILLARY
Glucose-Capillary: 120 mg/dL — ABNORMAL HIGH (ref 70–99)
Glucose-Capillary: 60 mg/dL — ABNORMAL LOW (ref 70–99)
Glucose-Capillary: 69 mg/dL — ABNORMAL LOW (ref 70–99)
Glucose-Capillary: 74 mg/dL (ref 70–99)
Glucose-Capillary: 77 mg/dL (ref 70–99)
Glucose-Capillary: 79 mg/dL (ref 70–99)
Glucose-Capillary: 93 mg/dL (ref 70–99)
Glucose-Capillary: 97 mg/dL (ref 70–99)

## 2024-04-25 LAB — COMPREHENSIVE METABOLIC PANEL WITH GFR
ALT: 81 U/L — ABNORMAL HIGH (ref 0–44)
AST: 57 U/L — ABNORMAL HIGH (ref 15–41)
Albumin: 2.3 g/dL — ABNORMAL LOW (ref 3.5–5.0)
Alkaline Phosphatase: 108 U/L (ref 38–126)
Anion gap: 5 (ref 5–15)
BUN: 10 mg/dL (ref 8–23)
CO2: 22 mmol/L (ref 22–32)
Calcium: 8 mg/dL — ABNORMAL LOW (ref 8.9–10.3)
Chloride: 105 mmol/L (ref 98–111)
Creatinine, Ser: 0.49 mg/dL (ref 0.44–1.00)
GFR, Estimated: >60 mL/min (ref >60)
Glucose, Bld: 65 mg/dL — ABNORMAL LOW (ref 70–99)
Potassium: 4 mmol/L (ref 3.5–5.1)
Sodium: 132 mmol/L — ABNORMAL LOW (ref 135–145)
Total Bilirubin: 0.7 mg/dL (ref 0.0–1.2)
Total Protein: 6.6 g/dL (ref 6.5–8.1)

## 2024-04-25 MED ORDER — SODIUM CHLORIDE 0.9 % IV BOLUS
500.0000 mL | Freq: Once | INTRAVENOUS | Status: AC
  Administered 2024-04-25: 22:00:00 500 mL via INTRAVENOUS

## 2024-04-25 MED ORDER — MORPHINE SULFATE (PF) 2 MG/ML IV SOLN
1.0000 mg | Freq: Three times a day (TID) | INTRAVENOUS | Status: DC | PRN
  Administered 2024-04-25 – 2024-04-28 (×6): 1 mg via INTRAVENOUS
  Filled 2024-04-25 (×6): qty 1

## 2024-04-25 MED ORDER — DEXTROSE 50 % IV SOLN
1.0000 | INTRAVENOUS | Status: DC | PRN
  Administered 2024-04-28 – 2024-04-30 (×4): 50 mL via INTRAVENOUS
  Filled 2024-04-25 (×3): qty 50

## 2024-04-25 MED ORDER — DEXTROSE 5 % IV SOLN: INTRAVENOUS | Status: AC

## 2024-04-25 MED ORDER — SODIUM CHLORIDE 0.9 % IV BOLUS
500.0000 mL | Freq: Once | INTRAVENOUS | Status: AC
  Administered 2024-04-25: 23:00:00 500 mL via INTRAVENOUS

## 2024-04-25 MED ORDER — METOPROLOL TARTRATE 12.5 MG HALF TABLET
12.5000 mg | ORAL_TABLET | Freq: Two times a day (BID) | ORAL | Status: DC
  Administered 2024-04-25 – 2024-05-06 (×19): 12.5 mg via ORAL
  Filled 2024-04-25 (×19): qty 1

## 2024-04-25 MED ORDER — ROMIPLOSTIM 250 MCG ~~LOC~~ SOLR
3.0000 ug/kg | SUBCUTANEOUS | Status: DC
  Administered 2024-04-26: 245 ug via SUBCUTANEOUS
  Filled 2024-04-25 (×2): qty 0.49

## 2024-04-25 MED ORDER — HYDROCODONE-ACETAMINOPHEN 5-325 MG PO TABS
1.0000 | ORAL_TABLET | Freq: Three times a day (TID) | ORAL | Status: DC | PRN
  Administered 2024-04-25 – 2024-06-08 (×51): 1 via ORAL
  Filled 2024-04-25 (×46): qty 1

## 2024-04-25 MED ADMIN — Adenosine IV Soln 6 MG/2ML: 6 mg | INTRAVENOUS | NDC 25021031802

## 2024-04-25 MED FILL — Adenosine IV Soln 6 MG/2ML: 6.0000 mg | INTRAVENOUS | Qty: 2 | Status: AC

## 2024-04-25 NOTE — Progress Notes (Incomplete)
 [facility] night cross cover note:   I was notified by the patient's RN  ***  asympatomiatic  Svt. Vagal , carotid, scheduled lopressor , prn iv lopressor . Latter 2 are existing orders. Mag level, EKG x 2 adenosine .  Eva Pore, DO Hospitalist

## 2024-04-25 NOTE — Progress Notes (Signed)
 Physical Therapy Treatment Patient Details Name: [name] MRN: [id number] DOB: 1945/06/20 Today's Date: 04/25/2024   History of Present Illness Pt is a 78 y/o female presenting 04/14/24 after roommate called EMS as the patient was unresponsive. She reports chest pain and was very confused. Pt found under single blanket, alone with no heat or lights on in home. PMHx:  HTN, HLD, CVA left hemiparesis, CAPD, DVT, IVH. UA positive.    PT Comments  Pt demonstrates improved tolerance to session with decreased pain and fatigue. Supine bed exercises completed AROM on R LE and PROM on L LE due to paresis. Pt tolerated well and performs movement through the full available range. MaxA required for supine to sit, pt demonstrates improved trunk control with mild L lean noted. Pt better able to maintain midline trunk position with cuing and light assist. Pt also demonstrates improved trunk control with forward/backward and lateral leans. STS attempted, requiring maxA via gait belt with B LE blocked. Pt able to achieve 80% of stance with maxA but was unable to tolerate standing >3s. Pt would benefit from continued PT services focused on strength, bed mobility, and transfers to promote out of bed mobility.    If plan is discharge home, recommend the following: A lot of help with walking and/or transfers;A lot of help with bathing/dressing/bathroom;Assistance with cooking/housework;Direct supervision/assist for medications management;Direct supervision/assist for financial management;Assist for transportation;Help with stairs or ramp for entrance;Supervision due to cognitive status   Can travel by private vehicle     No  Equipment Recommendations  Rolling walker (2 wheels);BSC/3in1;Wheelchair cushion (measurements PT);Wheelchair (measurements PT)    Recommendations for Other Services       Precautions / Restrictions Precautions Precautions: Fall Recall of Precautions/Restrictions:  Impaired Restrictions Weight Bearing Restrictions Per Provider Order: No     Mobility  Bed Mobility Overal bed mobility: Needs Assistance Bed Mobility: Supine to Sit, Sit to Supine     Supine to sit: Mod assist, Max assist Sit to supine: Mod assist   General bed mobility comments: Trunk and LLE assist required for supine to sit. B LE assist for sit to supine. Pt follows cues to move R LE towards EOB and attempts to push through elbow and use bed rails to achieve upright position.    Transfers Overall transfer level: Needs assistance Equipment used: None Transfers: Sit to/from Stand Sit to Stand: Max assist           General transfer comment: B LE braced to attempt stand to prevent knee buckling. Pt rocks with PT to gain momentum to stand but has difficulty producing strength needed to clear hips from seated surface. Pt unable to tolerance stance >3s.    Ambulation/Gait                   Stairs             Wheelchair Mobility     Tilt Bed    Modified Rankin (Stroke Patients Only)       Balance Overall balance assessment: Needs assistance Sitting-balance support: Single extremity supported Sitting balance-Leahy Scale: Good Sitting balance - Comments: Pt seated balance continues to improve. Assist needed to correct L trunk lean and promote upright posture. No posterior lean noted today. Decreased pushing noted today. Postural control: Left lateral lean Standing balance support: No upper extremity supported Standing balance-Leahy Scale: Zero Standing balance comment: Total assist for balance in standing and bracing against bed. Pt attempts to grab bed rail to assist balance.  Communication Communication Communication: Impaired Factors Affecting Communication: Hearing impaired;Reduced clarity of speech;Difficulty expressing self  Cognition Arousal: Alert Behavior During Therapy: WFL for tasks  assessed/performed   PT - Cognitive impairments: Awareness, Safety/Judgement, Problem solving, Sequencing, Initiation, Memory, Attention                         Following commands: Impaired Following commands impaired: Follows one step commands inconsistently    Cueing Cueing Techniques: Verbal cues, Tactile cues, Visual cues  Exercises      General Comments General comments (skin integrity, edema, etc.): VSS throughout. No new skin abnormalities noted.      Pertinent Vitals/Pain Pain Assessment Pain Assessment: No/denies pain    Home Living                          Prior Function            PT Goals (current goals can now be found in the care plan section) Progress towards PT goals: Progressing toward goals    Frequency    Min 2X/week      PT Plan      Co-evaluation              AM-PAC PT 6 Clicks Mobility   Outcome Measure  Help needed turning from your back to your side while in a flat bed without using bedrails?: A Little Help needed moving from lying on your back to sitting on the side of a flat bed without using bedrails?: A Lot Help needed moving to and from a bed to a chair (including a wheelchair)?: Total Help needed standing up from a chair using your arms (e.g., wheelchair or bedside chair)?: A Lot Help needed to walk in hospital room?: Total Help needed climbing 3-5 steps with a railing? : Total 6 Click Score: 10    End of Session Equipment Utilized During Treatment: Gait belt Activity Tolerance: Patient tolerated treatment well Patient left: in bed;with call bell/phone within reach;with bed alarm set Nurse Communication: Mobility status PT Visit Diagnosis: Unsteadiness on feet (R26.81);Muscle weakness (generalized) (M62.81) Hemiplegia - Right/Left: Left Hemiplegia - dominant/non-dominant: Non-dominant Hemiplegia - caused by: Cerebral infarction     Time: 8552-8487 PT Time Calculation (min) (ACUTE ONLY): 25  min  Charges:    $Therapeutic Exercise: 8-22 mins $Therapeutic Activity: 8-22 mins PT General Charges $$ ACUTE PT VISIT: 1 Visit                     Sabra Morel, PT, DPT  Acute Rehabilitation Services         Office: [phone]      Sabra MARLA Morel 04/25/2024, 3:58 PM

## 2024-04-25 NOTE — Plan of Care (Signed)
  Problem: Clinical Measurements: Goal: Ability to maintain clinical measurements within normal limits will improve Outcome: Progressing Goal: Diagnostic test results will improve Outcome: Progressing   Problem: Elimination: Goal: Will not experience complications related to urinary retention Outcome: Progressing

## 2024-04-25 NOTE — Progress Notes (Signed)
 Occupational Therapy Treatment Patient Details Name: [name] MRN: [id number] DOB: 11-Mar-1946 Today's Date: 04/25/2024   History of present illness 78 y/o female presenting 04/14/24 after roommate called EMS as the patient was unresponsive. She reports chest pain and was very confused. Pt found under single blanket, alone with no heat or lights on in home. PMHx:  HTN, HLD, CVA left hemiparesis, CAPD, DVT, IVH. UA positive.   OT comments  Patient received in supine with HOB elevated and being fed by nursing. COTA resumed with assisting patient with meal.  Patient was able to manage utensils without adaptation with occasional min assist and able to manage cup. Patient able to perform face washing after meal and declined oral care. LUE PROM/AAROM performed and positioned to allow for elevation. Patient will benefit from continued inpatient follow up therapy, <3 hours/day.  Acute OT to continue to follow to address established goals to facilitate DC to next venue of care.        If plan is discharge home, recommend the following:  Two people to help with bathing/dressing/bathroom;Two people to help with walking and/or transfers;Assistance with feeding;Direct supervision/assist for financial management;Assistance with cooking/housework;Direct supervision/assist for medications management   Equipment Recommendations  Other (comment) (defer)    Recommendations for Other Services      Precautions / Restrictions Precautions Precautions: Fall Recall of Precautions/Restrictions: Impaired Precaution/Restrictions Comments: Pt with poor safety awareness and cognitive deficits, unable to recall precautions. Restrictions Weight Bearing Restrictions Per Provider Order: No       Mobility Bed Mobility                    Transfers                         Balance                                           ADL either performed or assessed with clinical  judgement   ADL Overall ADL's : Needs assistance/impaired Eating/Feeding: Minimal assistance;Bed level Eating/Feeding Details (indicate cue type and reason): patient able to manage utensils and  cup with occasional assistance to reach items Grooming: Wash/dry face;Supervision/safety;Bed level Grooming Details (indicate cue type and reason): able to wash face following self feeding, declined oral care                               General ADL Comments: patient in bed with nrusing feeding patient upon entry, COTA took over for feeding with patient able to feed self with cues for encouragement and occasional min assist    Extremity/Trunk Assessment              Vision       Perception     Praxis     Communication Communication Communication: Impaired Factors Affecting Communication: Hearing impaired;Difficulty expressing self;Reduced clarity of speech   Cognition Arousal: Alert Behavior During Therapy: WFL for tasks assessed/performed Cognition: No family/caregiver present to determine baseline                               Following commands: Intact        Cueing   Cueing Techniques: Verbal cues, Tactile cues, Visual cues  Exercises Exercises: General Upper Extremity  General Exercises - Upper Extremity Shoulder Flexion: PROM, Left, 5 reps, Supine Shoulder ABduction: PROM, Left, 5 reps, Supine Elbow Flexion: AAROM, Left, 5 reps, Supine Elbow Extension: AAROM, Left, 5 reps, Supine    Shoulder Instructions       General Comments      Pertinent Vitals/ Pain       Pain Assessment Pain Assessment: Faces Faces Pain Scale: Hurts little more Pain Location: left shoulder with ROM Pain Descriptors / Indicators: Grimacing, Guarding Pain Intervention(s): Limited activity within patient's tolerance, Monitored during session, Repositioned  Home Living                                          Prior Functioning/Environment               Frequency  Min 2X/week        Progress Toward Goals  OT Goals(current goals can now be found in the care plan section)  Progress towards OT goals: Progressing toward goals  Acute Rehab OT Goals Patient Stated Goal: none stated OT Goal Formulation: Patient unable to participate in goal setting Time For Goal Achievement: 04/30/24 Potential to Achieve Goals: Fair ADL Goals Pt Will Perform Eating: with max assist;with mod assist;with adaptive utensils;sitting Pt Will Perform Grooming: with max assist;with mod assist;sitting Pt Will Perform Upper Body Bathing: with max assist;with mod assist;sitting Pt Will Perform Upper Body Dressing: with max assist;with mod assist;sitting  Plan      Co-evaluation                 AM-PAC OT 6 Clicks Daily Activity     Outcome Measure   Help from another person eating meals?: A Little Help from another person taking care of personal grooming?: A Little Help from another person toileting, which includes using toliet, bedpan, or urinal?: Total Help from another person bathing (including washing, rinsing, drying)?: Total Help from another person to put on and taking off regular upper body clothing?: Total Help from another person to put on and taking off regular lower body clothing?: Total 6 Click Score: 10    End of Session    OT Visit Diagnosis: Other abnormalities of gait and mobility (R26.89);Muscle weakness (generalized) (M62.81);Hemiplegia and hemiparesis;Pain Hemiplegia - Right/Left: Left Hemiplegia - dominant/non-dominant: Non-Dominant Pain - Right/Left: Left Pain - part of body: Arm;Shoulder   Activity Tolerance Patient tolerated treatment well   Patient Left in bed;with call bell/phone within reach;with bed alarm set   Nurse Communication Mobility status;Other (comment) (patient able to feed self with setup and supervision with occasional assist)        Time: 8766-8743 OT Time Calculation (min): 23  min  Charges: OT General Charges $OT Visit: 1 Visit OT Treatments $Self Care/Home Management : 8-22 mins $Therapeutic Exercise: 8-22 mins  Dick Laine, OTA Acute Rehabilitation Services  Office [phone]   Jeb LITTIE Laine 04/25/2024, 2:00 PM

## 2024-04-25 NOTE — Progress Notes (Signed)
 [facility] night cross cover note:   Patient with recurrent/borderline hypoglycemia overnight for acute 4-hour CBG monitoring, with CBG results in the high 60s to 70s, with recurrence of similar values noted after eating as well as drinking juice.  Order added for as needed amps of D50 for CBG less than 70.  Additionally, have ordered D5 running at the low rate of 40 cc/h, with caution given pt's history of chf. Will monitor for result of this morning's Na level per existing order for cmp with AM labs.     Eva Pore, DO Hospitalist

## 2024-04-25 NOTE — Progress Notes (Signed)
 PROGRESS NOTE        PATIENT DETAILS Name: [name] Age: 78 y.o. Sex: female Date of Birth: May 25, 1945 Admit Date: 04/13/2024 Admitting Physician Tamela Stakes, MD ERE:Bzmryzrx, Rojelio PARAS, NP  Brief Summary: Patient is a 78 y.o.  female with history of CVA, COPD, HTN-who was brought to the ED after she was found by her roommate unresponsive (no heat/light-under a single blanket)-she was apparently covered in feces/dried blood-found to be hypothermic/hypotensive/hypoglycemic-she was thought to have undifferentiated shock-acute liver failure-acute kidney injury-admitted to the ICU-stabilized with pressors/D10 infusion/broad-spectrum antibiotics-rapidly improved and subsequently transferred to [facility].  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 12/8>> ANA: Positive 12/8>> anti-smooth muscle antibody: Negative  Significant microbiology data: 12/6>> COVID/influenza/RSV PCR: Negative 12/6>> blood culture: No growth 12/6>> acute hepatitis  serology: Negative 12/6>> CMV DNA PCR: Negative 12/9>> blood culture: No growth  Procedures: None  Consults: PCCM GI ID Hematology [name])  Subjective:  Patient in bed, appears comfortable, denies any headache, no fever, no chest pain or pressure, no shortness of breath , no abdominal pain. No new focal weakness.   Objective: Vitals: Blood pressure (!) 148/57, pulse (!) 55, temperature 98.1 F (36.7 C), temperature source Axillary, resp. rate 14, height 5' 10 (1.778 m), weight 79.9 kg, SpO2 100%.   Exam:  Awake Alert, No new F.N deficits, left-sided weakness from stroke [name].AT,PERRAL Supple Neck, No JVD,   Symmetrical Chest wall movement, Good air movement bilaterally, CTAB RRR,No Gallops, Rubs or new Murmurs,  +ve B.Sounds, Abd Soft, No tenderness,   No Cyanosis, Clubbing or edema    Assessment/Plan:  Undifferentiated shock Unclear etiology-initially thought to have septic shock but all cultures negative to date-has rapidly improved-initially on admission to the ICU was started on vancomycin /Zosyn  but all antibiotics were discontinued on 12/7.  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  Plans were to obtain TEE but per cardiology-unable to safely perform TEE unless platelet count persistently above 50K.  Will need to notify cardiology once platelet counts are persistently above 50K.  Thrombocytopenia Likely secondary to acute liver injury/failure-possible sepsis physiology-however no improvement in spite of improvement in her sepsis physiology and liver injury.  No evidence of TTP on peripheral smear-unclear if this is ITP. Given continued transfusions as needed last on 04/20/2023.   Case discussed with hematology on-call-Dr. Gudina on 04/21/2024, no further platelet transfusions today,  Nplate  started on 04/21/2024, hematology following, 1 unit of platelet transfusion due to persistent severe thrombocytopenia on  04/22/2024, discussed with hematology on 04/21/2024 and 04/22/2024.  Normocytic anemia Likely secondary to critical illness Continue to trend/follow CBC, per hematology 1 unit of packed RBC on 04/21/2024.  History of SVT night of 04/22/2024, seen by cardiology, resolved after adenosine .  Placed on low-dose beta-blocker, currently stable, stable TSH continue to monitor.  Acute metabolic encephalopathy Secondary to hypothermia/hypoglycemia/undifferentiated shock/acute liver failure Head CT without acute abnormalities Seems to have clinically improved after treatment of underlying etiologies.  Hypothermia Likely secondary to environmental exposure (per prior notes- cool room with single blanket-no heating or light) TSH stable Now normothermic after supportive care  Acute liver failure Unclear etiology-possibly shock liver from hypotension from possible sepsis physiology LFTs/coags improving Dopplers without portal/hepatic vein thrombosis Acute hepatitis serology negative CMV IgM positive but viral load negative  Negative workup for autoimmune hepatitis GI following-completed N-acetylcysteine  12/10 Empirically on lactulose   Oropharyngeal dysphagia Secondary to critical illness/encephalopathy SLP following-on dysphagia 2 diet.  AKI Hemodynamically mediated due to hypotension Resolved.  Hypokalemia/Hypophosphatemia Repleted  Metabolic acidosis Likely due to delayed clearance of lactic acid-in the setting of liver failure. Improved with supportive care  Self-limited episode-small-volume coffee-ground emesis Hb stable PPI GI following-no endoscopic procedures planned at this point  Chest pain Likely atypical Troponins minimally elevated-echo with stable EF Supportive care for now  Minimally elevated troponins Trend is flat Likely demand  ischemia in the setting of hypotension/critical illness. Echo with able EF  HTN BP stable, low-dose beta-blocker and Norvasc .  History of chronic HFpEF Status stable  History of CVA Baseline left hemiparesis Antiplatelets/statin held-in the setting of elevated liver enzymes and thrombocytopenia.  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  Lab Results  Component Value Date   HGBA1C 5.3 09/11/2020   Lab Results  Component Value Date   TSH 3.370 04/23/2024    CBG (last 3)  Recent Labs    04/25/24 0407 04/25/24 0431 04/25/24 0733  GLUCAP 60* 74 69*     Pressure Ulcer: Agree with assessment and plan as outlined below Wound 04/14/24 0100 Pressure Injury Hip Left Stage 2 -  Partial thickness loss of dermis presenting as a shallow open injury with a red, pink wound bed without slough. (Active)     Wound 04/14/24 0100 Pressure Injury Buttocks Left;Right Unstageable - Full thickness tissue loss in which the base of the injury is covered by slough (yellow, tan, gray, green or brown) and/or eschar (tan, brown or black) in the wound bed. (Active)     Wound 04/14/24 0100 Pressure Injury Sacrum Medial Stage 2 -  Partial thickness loss of dermis presenting as a shallow open injury with a red, pink wound bed without slough. (Active)    Code status:   Code Status: Full Code   DVT Prophylaxis: SCDs Start:  04/13/24 2344 Avoid pharmacological prophylaxis-given severity of thrombocytopenia   Family Communication: Daughter-Tiana-[phone] left VM 12/10,12/12.12/13   Disposition Plan: Status is: Inpatient Remains inpatient appropriate because: Severity of illness   Planned Discharge Destination:Skilled  nursing facility   Diet: Diet Order             DIET DYS 3 Room service appropriate? Yes with Assist; Fluid consistency: Thin  Diet effective now                     Data Review:   Patient Lines/Drains/Airways Status     Active Line/Drains/Airways     Name Placement date Placement time Site Days   Peripheral IV 04/22/24 20 G 2.5 Anterior;Right;Upper Arm 04/22/24  2239  Arm  3   External Urinary Catheter --  --  --  --   Wound 04/14/24 0100 Pressure Injury Hip Left Stage 2 -  Partial thickness loss of dermis presenting as a shallow open injury with a red, pink wound bed without slough. 04/14/24  0100  Hip  11   Wound 04/14/24 0100 Pressure Injury Buttocks Left;Right Unstageable - Full thickness tissue loss in which the base of the injury is covered by slough (yellow, tan, gray, green or brown) and/or eschar (tan, brown or black) in the wound bed. 04/14/24  0100  Buttocks  11   Wound 04/14/24 0100 Pressure Injury Sacrum Medial Stage 2 -  Partial thickness loss of dermis presenting as a shallow open injury with a red, pink wound bed without slough. 04/14/24  0100  Sacrum  11             Inpatient Medications  Scheduled Meds:  amLODipine   10 mg Oral Daily   feeding supplement  237 mL Oral BID BM   Gerhardt's butt cream   Topical BID   lactulose   20 g Oral TID   metoprolol  tartrate  25 mg Oral BID   multivitamin with minerals  1 tablet Oral Daily   mouth rinse  15 mL Mouth Rinse 4 times per day   pantoprazole   40 mg Oral BID   [START ON 04/26/2024] romiPLOStim   3 mcg/kg Subcutaneous Weekly   Continuous Infusions:  cefTRIAXone  (ROCEPHIN )  IV 2 g (04/24/24 1402)    DAPTOmycin  700 mg (04/24/24 1319)   dextrose  40 mL/hr at 04/25/24 0427   PRN Meds:.dextrose , docusate, hydrALAZINE , HYDROcodone -acetaminophen , ipratropium-albuterol , metoprolol  tartrate, morphine  injection, ondansetron  (ZOFRAN ) IV, mouth rinse, polyethylene glycol  DVT Prophylaxis  SCDs Start: 04/13/24 2344   Recent Labs  Lab 04/22/24 0259 04/22/24 2342 04/23/24 0351 04/24/24 0328 04/25/24 0346  WBC 4.2 3.8* 4.1 3.6* 3.1*  HGB 9.1* 9.4* 9.8* 8.8* 8.1*  HCT 27.0* 27.7* 30.1* 26.3* 24.6*  PLT 14* 42* 27* 37* 38*  MCV 91.5 90.8 94.1 93.6 93.9  MCH 30.8 30.8 30.6 31.3 30.9  MCHC 33.7 33.9 32.6 33.5 32.9  RDW 17.1* 17.6* 17.7* 17.7* 18.1*  LYMPHSABS 1.5 1.2 1.2 0.6* 1.4  MONOABS 0.4 0.3 0.3 0.1 0.3  EOSABS 0.2 0.2 0.2 0.0 0.2  BASOSABS 0.0 0.0 0.0 0.0 0.0    Recent Labs  Lab 04/19/24 0244 04/20/24 0808 04/21/24 0408 04/22/24 0259 04/22/24 2342 04/23/24 0351 04/24/24 0328 04/25/24 0346  NA 142   < > 131* 132* 135 134* 133* 132*  K 3.9   < > 3.5 3.7 3.5 3.8 3.9 4.0  CL 108   < > 101 104 105 105 106 105  CO2 27   < > 23 24 24 24  21* 22  ANIONGAP 7   < > 7 4* 6 5 7 5   GLUCOSE 85   < > 74 71 93 97 70 65*  BUN 10   < >  10 9 10 11 10 10   CREATININE 0.43*   < > 0.49 0.56 0.57 0.57 0.52 0.49  AST 157*   < > 73* 67* 68* 71* 64* 57*  ALT 313*   < > 141* 119* 103* 105* 100* 81*  ALKPHOS 127*   < > 102 99 105 110 114 108  BILITOT 0.9   < > 1.1 1.2 1.1 1.4* 1.0 0.7  ALBUMIN  2.2*   < > 1.6* 1.8* 1.9* 2.1* 2.2* 2.3*  INR 1.1  --   --   --   --   --   --   --   TSH  --   --   --   --   --  3.370  --   --   BNP  --   --   --   --  227.1*  --   --   --   MG 2.2  --  1.6* 2.0 1.7 1.8 1.9  --   PHOS 2.6  --  2.4* 2.8  --  2.4* 2.5  --   CALCIUM  8.1*   < > 7.4* 7.3* 7.4* 7.6* 8.0* 8.0*   < > = values in this interval not displayed.      Recent Labs  Lab 04/19/24 0244 04/20/24 9191 04/21/24 0408 04/22/24 0259 04/22/24 2342 04/23/24 0351 04/24/24 0328 04/25/24 0346  INR  1.1  --   --   --   --   --   --   --   TSH  --   --   --   --   --  3.370  --   --   BNP  --   --   --   --  227.1*  --   --   --   MG 2.2  --  1.6* 2.0 1.7 1.8 1.9  --   CALCIUM  8.1*   < > 7.4* 7.3* 7.4* 7.6* 8.0* 8.0*   < > = values in this interval not displayed.    --------------------------------------------------------------------------------------------------------------- Lab Results  Component Value Date   CHOL 288 (H) 05/30/2023   HDL 78 05/30/2023   LDLCALC 165 (H) 05/30/2023   TRIG 248 (H) 05/30/2023   CHOLHDL 3.7 05/30/2023    Lab Results  Component Value Date   HGBA1C 5.3 09/11/2020   Recent Labs    04/23/24 0351  TSH 3.370  FREET4 0.66*   No results for input(s): VITAMINB12, FOLATE, FERRITIN, TIBC, IRON, RETICCTPCT in the last 72 hours. ------------------------------------------------------------------------------------------------------------------ Cardiac Enzymes No results for input(s): CKMB, TROPONINI, MYOGLOBIN in the last 168 hours.  Invalid input(s): CK  Micro Results Recent Results (from the past 240 hours)  Culture, blood (Routine X 2) w Reflex to ID Panel     Status: None   Collection Time: 04/16/24 11:42 AM   Specimen: BLOOD  Result Value Ref Range Status   Specimen Description BLOOD SITE NOT SPECIFIED  Final   Special Requests   Final    BOTTLES DRAWN AEROBIC AND ANAEROBIC Blood Culture adequate volume   Culture   Final    NO GROWTH 5 DAYS Performed at [facility], [address] [place], [place] [address]    Report Status 04/21/2024 FINAL  Final  Culture, blood (Routine X 2) w Reflex to ID Panel     Status: None   Collection Time: 04/16/24  2:56 PM   Specimen: BLOOD RIGHT ARM  Result Value Ref Range Status   Specimen Description BLOOD RIGHT  ARM  Final   Special Requests   Final    BOTTLES DRAWN AEROBIC AND ANAEROBIC Blood Culture adequate volume   Culture   Final    NO GROWTH 5 DAYS Performed at [facility], [address], [place], [place] [address]    Report Status 04/21/2024 FINAL  Final    Radiology Reports  No results found.     Signature  -   Lavada Stank M.D on 04/25/2024 at 9:52 AM   -  To page go to [email or web address]

## 2024-04-26 DIAGNOSIS — I471 Supraventricular tachycardia, unspecified: Secondary | ICD-10-CM | POA: Diagnosis not present

## 2024-04-26 LAB — CBC WITH DIFFERENTIAL/PLATELET
Abs Immature Granulocytes: 0.02 K/uL (ref 0.00–0.07)
Basophils Absolute: 0 K/uL (ref 0.0–0.1)
Basophils Relative: 1 %
Eosinophils Absolute: 0.2 K/uL (ref 0.0–0.5)
Eosinophils Relative: 6 %
HCT: 24.3 % — ABNORMAL LOW (ref 36.0–46.0)
Hemoglobin: 8 g/dL — ABNORMAL LOW (ref 12.0–15.0)
Immature Granulocytes: 1 %
Lymphocytes Relative: 42 %
Lymphs Abs: 1.3 K/uL (ref 0.7–4.0)
MCH: 31.3 pg (ref 26.0–34.0)
MCHC: 32.9 g/dL (ref 30.0–36.0)
MCV: 94.9 fL (ref 80.0–100.0)
Monocytes Absolute: 0.2 K/uL (ref 0.1–1.0)
Monocytes Relative: 8 %
Neutro Abs: 1.4 K/uL — ABNORMAL LOW (ref 1.7–7.7)
Neutrophils Relative %: 42 %
Platelets: 33 K/uL — ABNORMAL LOW (ref 150–400)
RBC: 2.56 MIL/uL — ABNORMAL LOW (ref 3.87–5.11)
RDW: 18.7 % — ABNORMAL HIGH (ref 11.5–15.5)
Smear Review: NORMAL
WBC: 3.1 K/uL — ABNORMAL LOW (ref 4.0–10.5)
nRBC: 0 % (ref 0.0–0.2)

## 2024-04-26 LAB — GLUCOSE, CAPILLARY
Glucose-Capillary: 72 mg/dL (ref 70–99)
Glucose-Capillary: 79 mg/dL (ref 70–99)
Glucose-Capillary: 85 mg/dL (ref 70–99)
Glucose-Capillary: 75 mg/dL (ref 70–99)
Glucose-Capillary: 78 mg/dL (ref 70–99)
Glucose-Capillary: 97 mg/dL (ref 70–99)

## 2024-04-26 LAB — BASIC METABOLIC PANEL WITH GFR
Anion gap: 8 (ref 5–15)
BUN: 7 mg/dL — ABNORMAL LOW (ref 8–23)
CO2: 18 mmol/L — ABNORMAL LOW (ref 22–32)
Calcium: 7.8 mg/dL — ABNORMAL LOW (ref 8.9–10.3)
Chloride: 106 mmol/L (ref 98–111)
Creatinine, Ser: 0.5 mg/dL (ref 0.44–1.00)
GFR, Estimated: >60 mL/min
Glucose, Bld: 75 mg/dL (ref 70–99)
Potassium: 3.8 mmol/L (ref 3.5–5.1)
Sodium: 131 mmol/L — ABNORMAL LOW (ref 135–145)

## 2024-04-26 LAB — MAGNESIUM
Magnesium: 1.6 mg/dL — ABNORMAL LOW (ref 1.7–2.4)
Magnesium: 2.1 mg/dL (ref 1.7–2.4)

## 2024-04-26 MED ORDER — MELATONIN 3 MG PO TABS
3.0000 mg | ORAL_TABLET | Freq: Every evening | ORAL | Status: AC | PRN
  Administered 2024-04-26 – 2024-06-12 (×14): 3 mg via ORAL
  Filled 2024-04-26 (×12): qty 1

## 2024-04-26 MED ORDER — MAGNESIUM SULFATE 2 GM/50ML IV SOLN
2.0000 g | Freq: Once | INTRAVENOUS | Status: AC
  Administered 2024-04-26: 2 g via INTRAVENOUS
  Filled 2024-04-26: qty 50

## 2024-04-26 NOTE — TOC Progression Note (Signed)
 Transition of Care [facility]) - Progression Note    Patient Details  Name: Shabana Armentrout MRN: [id number] Date of Birth: [birth year]  [facility]) CM/SW Contact  [name], LCSW Phone Number: 04/26/2024, 9:19 AM  Clinical Narrative:    CSW continuing to follow and update APS worker, Hildreth Sharps, who reported that daughter has agreed to sign patient in to [facility] when stable. Will require insurance authorization per [facility].   Expected Discharge Plan: Skilled Nursing Facility Barriers to Discharge: Continued Medical Work up               Expected Discharge Plan and Services In-house Referral: Clinical Social Work   Post Acute Care Choice: Skilled Nursing Facility Living arrangements for the past 2 months: Single Family Home                                       Social Drivers of Health (SDOH) Interventions SDOH Screenings   Food Insecurity: Food Insecurity Present (04/15/2024)  Housing: High Risk (04/22/2024)  Transportation Needs: Unmet Transportation Needs (04/15/2024)  Utilities: At Risk (04/15/2024)  Depression (PHQ2-9): Low Risk (06/04/2021)  Social Connections: Unknown (04/15/2024)  Tobacco Use: Medium Risk (03/22/2024)    Readmission Risk Interventions    01/15/2024    2:24 PM  Readmission Risk Prevention Plan  Transportation Screening Complete  HRI or Home Care Consult Complete  Social Work Consult for Recovery Care Planning/Counseling Complete  Palliative Care Screening Not Applicable  Medication Review Oceanographer) Complete

## 2024-04-26 NOTE — Progress Notes (Signed)
 Had a run of sustained SVT to 150s, patient is asymptomatic. Paged Dr. Marcene. Gave PRN lopressor  and NS bolus with no relief. Gave adenocard  IV as per Cardio advised. Pt back to NSR, blood pressure is stable to low 100s.   04/25/24 2212  Assess: MEWS Score  BP 108/66  MAP (mmHg) 79  Pulse Rate (!) 157  ECG Heart Rate (!) 158  Resp (!) 21  Level of Consciousness Alert  SpO2 100 %  O2 Device Room Air  Assess: MEWS Score  MEWS Temp 0  MEWS Systolic 0  MEWS Pulse 3  MEWS RR 1  MEWS LOC 0  MEWS Score 4  MEWS Score Color Red  Assess: if the MEWS score is Yellow or Red  Were vital signs accurate and taken at a resting state? Yes  Does the patient meet 2 or more of the SIRS criteria? Yes  Does the patient have a confirmed or suspected source of infection? Yes  MEWS guidelines implemented  Yes, red  Treat  MEWS Interventions Considered administering scheduled or prn medications/treatments as ordered  Take Vital Signs  Increase Vital Sign Frequency  Red: Q1hr x2, continue Q4hrs until patient remains green for 12hrs  Escalate  MEWS: Escalate Red: Discuss with charge nurse and notify provider. Consider notifying RRT. If remains red for 2 hours consider need for higher level of care  Notify: Charge Nurse/RN  Name of Charge Nurse/RN Notified Elspeth, RN  Provider Notification  Provider Name/Title J. Howerter, MD  Date Provider Notified 04/25/24  Time Provider Notified 2212  Method of Notification Page;Face-to-face  Notification Reason Other (Comment) (RED MEWS)  Provider response At bedside;See new orders  Date of Provider Response 04/25/24  Time of Provider Response 2215  Assess: SIRS CRITERIA  SIRS Temperature  0  SIRS Respirations  1  SIRS Pulse 1  SIRS WBC 0  SIRS Score Sum  2

## 2024-04-26 NOTE — Plan of Care (Signed)

## 2024-04-26 NOTE — Progress Notes (Signed)
 [facility] night cross cover note:   I was notified by the patient's RN that patient's most recent CBG while on D5W at 40 cc/h with poor oral intake, is in the low 70s.  RN also conveys that the order for D5W is about to expire.  I subsequently renewed the order for D5W at 40 cc/h for 1 additional day, with continuation of existing order for q4-hour CBG monitoring.      Eva Pore, DO Hospitalist

## 2024-04-26 NOTE — Progress Notes (Addendum)
 "                        PROGRESS NOTE        PATIENT DETAILS Name: [name] Age: 78 y.o. Sex: female Date of Birth: 23-Dec-1945 Admit Date: 04/13/2024 Admitting Physician Tamela Stakes, MD ERE:Bzmryzrx, Rojelio PARAS, NP  Brief Summary: Patient is a 78 y.o.  female with history of CVA, COPD, HTN-who was brought to the ED after she was found by her roommate unresponsive (no heat/light-under a single blanket)-she was apparently covered in feces/dried blood-found to be hypothermic/hypotensive/hypoglycemic-she was thought to have undifferentiated shock-acute liver failure-acute kidney injury-admitted to the ICU-stabilized with pressors/D10 infusion/broad-spectrum antibiotics-rapidly improved and subsequently transferred to [facility].  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 12/8>> ANA: Positive 12/8>> anti-smooth muscle antibody: Negative  Significant microbiology data: 12/6>> COVID/influenza/RSV PCR: Negative 12/6>> blood culture: No growth 12/6>> acute hepatitis  serology: Negative 12/6>> CMV DNA PCR: Negative 12/9>> blood culture: No growth  Procedures: None  Consults: PCCM GI ID Hematology [name])  Subjective:  Patient in bed, appears comfortable, denies any headache, no fever, no chest pain or pressure, no shortness of breath , no abdominal pain. No new focal weakness.  Objective: Vitals: Blood pressure (!) 119/56, pulse (!) 57, temperature 98.4 F (36.9 C), resp. rate 20, height 5' 10 (1.778 m), weight 79.9 kg, SpO2 100%.   Exam:  Awake Alert, No new F.N deficits, left-sided weakness from stroke [place].AT,PERRAL Supple Neck, No JVD,   Symmetrical Chest wall movement, Good air movement bilaterally, CTAB RRR,No Gallops, Rubs or new Murmurs,  +ve B.Sounds, Abd Soft, No tenderness,   No Cyanosis, Clubbing or edema    Assessment/Plan:  Undifferentiated shock Unclear etiology-initially thought to have septic shock but all cultures negative to date-has rapidly improved-initially on admission to the ICU was started on vancomycin /Zosyn  but all antibiotics were discontinued on 12/7.  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  Plans were to obtain TEE but per cardiology-unable to safely perform TEE unless platelet count persistently above 50K.  Will need to notify cardiology once platelet counts are persistently above 50K.  Thrombocytopenia Likely secondary to acute liver injury/failure-possible sepsis physiology-however no improvement in spite of improvement in her sepsis physiology and liver injury.  No evidence of TTP on peripheral smear-unclear if this is ITP.  Given continued transfusions as needed last on 04/20/2023.   Case discussed with hematology on-call-Dr. Gudina on 04/21/2024, no further platelet transfusions today, Nplate  started on 04/21/2024 and  we will be getting second dose on 04/26/2024, hematology following, 1 unit of platelet  transfusion due to persistent severe thrombocytopenia on 04/22/2024, discussed with hematology on 04/21/2024,04/22/2024, 04/26/2024.  Normocytic anemia Likely secondary to critical illness Continue to trend/follow CBC, per hematology 1 unit of packed RBC on 04/21/2024.  History of SVT night of 04/22/2024, seen by cardiology, resolved after adenosine .  Placed on low-dose beta-blocker, currently stable, stable TSH continue to monitor.  Acute metabolic encephalopathy Secondary to hypothermia/hypoglycemia/undifferentiated shock/acute liver failure Head CT without acute abnormalities Seems to have clinically improved after treatment of underlying etiologies.  Hypothermia Likely secondary to environmental exposure (per prior notes- cool room with single blanket-no heating or light) TSH stable Now normothermic after supportive care  Acute liver failure Unclear etiology-possibly shock liver from hypotension from possible sepsis physiology LFTs/coags improving Dopplers without portal/hepatic vein thrombosis Acute hepatitis serology negative CMV IgM positive but viral load negative  Negative workup for autoimmune hepatitis GI following-completed N-acetylcysteine  12/10 Empirically on lactulose   Oropharyngeal dysphagia Secondary to critical illness/encephalopathy SLP following-on dysphagia 2 diet.  AKI Hemodynamically mediated due to hypotension Resolved.  Hypokalemia/Hypophosphatemia Repleted  Metabolic acidosis Likely due to delayed clearance of lactic acid-in the setting of liver failure. Improved with supportive care  Self-limited episode-small-volume coffee-ground emesis Hb stable PPI GI following-no endoscopic procedures planned at this point  Chest pain Likely atypical Troponins minimally elevated-echo with stable EF Supportive care for now  Minimally elevated troponins Trend is  flat Likely demand ischemia in the setting of hypotension/critical illness. Echo with able EF  HTN BP stable, low-dose beta-blocker and Norvasc .  History of chronic HFpEF Status stable  History of CVA Baseline left hemiparesis Antiplatelets/statin held-in the setting of elevated liver enzymes and thrombocytopenia.  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  Lab Results  Component Value Date   HGBA1C 5.3 09/11/2020   Lab Results  Component Value Date   TSH 3.370 04/23/2024    CBG (last 3)  Recent Labs    04/25/24 2355 04/26/24 0447 04/26/24 0730  GLUCAP 79 72 79     Pressure Ulcer: Agree with assessment and plan as outlined below Wound 04/14/24 0100 Pressure Injury Hip Left Stage 2 -  Partial thickness loss of dermis presenting as a shallow open injury with a red, pink wound bed without slough. (Active)     Wound 04/14/24 0100 Pressure Injury Buttocks Left;Right Unstageable - Full thickness tissue loss in which the base of the injury is covered by slough (yellow, tan, gray, green or brown) and/or eschar (tan, brown or black) in the wound bed. (Active)     Wound 04/14/24 0100 Pressure Injury Sacrum Medial Stage 2 -  Partial thickness loss of dermis presenting as a shallow open injury with a red, pink wound bed without slough. (Active)    Code status:   Code Status: Full Code   DVT  Prophylaxis: SCDs Start: 04/13/24 2344 Avoid pharmacological prophylaxis-given severity of thrombocytopenia   Family Communication: Daughter-Tiana-[phone] left VM 12/10,12/12.12/13   Disposition Plan: Status is: Inpatient Remains inpatient appropriate because: Severity of  illness   Planned Discharge Destination:Skilled nursing facility   Diet: Diet Order             DIET DYS 3 Room service appropriate? Yes with Assist; Fluid consistency: Thin  Diet effective now                     Data Review:   Patient Lines/Drains/Airways Status     Active Line/Drains/Airways     Name Placement date Placement time Site Days   Peripheral IV 04/22/24 20 G 2.5 Anterior;Right;Upper Arm 04/22/24  2239  Arm  4   External Urinary Catheter --  --  --  --   Wound 04/14/24 0100 Pressure Injury Hip Left Stage 2 -  Partial thickness loss of dermis presenting as a shallow open injury with a red, pink wound bed without slough. 04/14/24  0100  Hip  12   Wound 04/14/24 0100 Pressure Injury Buttocks Left;Right Unstageable - Full thickness tissue loss in which the base of the injury is covered by slough (yellow, tan, gray, green or brown) and/or eschar (tan, brown or black) in the wound bed. 04/14/24  0100  Buttocks  12   Wound 04/14/24 0100 Pressure Injury Sacrum Medial Stage 2 -  Partial thickness loss of dermis presenting as a shallow open injury with a red, pink wound bed without slough. 04/14/24  0100  Sacrum  12             Inpatient Medications  Scheduled Meds:  amLODipine   10 mg Oral Daily   feeding supplement  237 mL Oral BID BM   Gerhardt's butt cream   Topical BID   lactulose   20 g Oral TID   metoprolol  tartrate  12.5 mg Oral BID   multivitamin with minerals  1 tablet Oral Daily   mouth rinse  15 mL Mouth Rinse 4 times per day   pantoprazole   40 mg Oral BID   romiPLOStim   3 mcg/kg Subcutaneous Weekly   Continuous Infusions:  cefTRIAXone  (ROCEPHIN )  IV Stopped (04/25/24  1626)   DAPTOmycin  200 mL/hr at 04/26/24 0457   dextrose  40 mL/hr at 04/26/24 0520   PRN Meds:.dextrose , docusate, hydrALAZINE , HYDROcodone -acetaminophen , ipratropium-albuterol , metoprolol  tartrate, morphine  injection, ondansetron  (ZOFRAN ) IV, mouth rinse, polyethylene glycol  DVT Prophylaxis  SCDs Start: 04/13/24 2344   Recent Labs  Lab 04/22/24 2342 04/23/24 0351 04/24/24 0328 04/25/24 0346 04/26/24 0711  WBC 3.8* 4.1 3.6* 3.1* 3.1*  HGB 9.4* 9.8* 8.8* 8.1* 8.0*  HCT 27.7* 30.1* 26.3* 24.6* 24.3*  PLT 42* 27* 37* 38* 33*  MCV 90.8 94.1 93.6 93.9 94.9  MCH 30.8 30.6 31.3 30.9 31.3  MCHC 33.9 32.6 33.5 32.9 32.9  RDW 17.6* 17.7* 17.7* 18.1* 18.7*  LYMPHSABS 1.2 1.2 0.6* 1.4 1.3  MONOABS 0.3 0.3 0.1 0.3 0.2  EOSABS 0.2 0.2 0.0 0.2 0.2  BASOSABS 0.0 0.0 0.0 0.0 0.0    Recent Labs  Lab 04/21/24 0408 04/22/24 0259 04/22/24 2342 04/23/24 0351 04/24/24 0328 04/25/24 0346 04/26/24 0014 04/26/24 0432 04/26/24 0711  NA 131* 132* 135 134* 133* 132*  --   --  131*  K 3.5 3.7 3.5 3.8 3.9 4.0  --   --  3.8  CL 101 104 105 105 106 105  --   --  106  CO2 23 24 24 24  21* 22  --   --  18*  ANIONGAP 7 4* 6 5 7 5   --   --  8  GLUCOSE 74 71  93 97 70 65*  --   --  75  BUN 10 9 10 11 10 10   --   --  7*  CREATININE 0.49 0.56 0.57 0.57 0.52 0.49  --   --  0.50  AST 73* 67* 68* 71* 64* 57*  --   --   --   ALT 141* 119* 103* 105* 100* 81*  --   --   --   ALKPHOS 102 99 105 110 114 108  --   --   --   BILITOT 1.1 1.2 1.1 1.4* 1.0 0.7  --   --   --   ALBUMIN  1.6* 1.8* 1.9* 2.1* 2.2* 2.3*  --   --   --   TSH  --   --   --  3.370  --   --   --   --   --   BNP  --   --  227.1*  --   --   --   --   --   --   MG 1.6* 2.0 1.7 1.8 1.9  --  1.6* 2.1  --   PHOS 2.4* 2.8  --  2.4* 2.5  --   --   --   --   CALCIUM  7.4* 7.3* 7.4* 7.6* 8.0* 8.0*  --   --  7.8*      Recent Labs  Lab 04/22/24 2342 04/23/24 0351 04/24/24 0328 04/25/24 0346 04/26/24 0014 04/26/24 0432 04/26/24 0711  TSH   --  3.370  --   --   --   --   --   BNP 227.1*  --   --   --   --   --   --   MG 1.7 1.8 1.9  --  1.6* 2.1  --   CALCIUM  7.4* 7.6* 8.0* 8.0*  --   --  7.8*    --------------------------------------------------------------------------------------------------------------- Lab Results  Component Value Date   CHOL 288 (H) 05/30/2023   HDL 78 05/30/2023   LDLCALC 165 (H) 05/30/2023   TRIG 248 (H) 05/30/2023   CHOLHDL 3.7 05/30/2023    Lab Results  Component Value Date   HGBA1C 5.3 09/11/2020   No results for input(s): TSH, T4TOTAL, FREET4, T3FREE, THYROIDAB in the last 72 hours.  No results for input(s): VITAMINB12, FOLATE, FERRITIN, TIBC, IRON, RETICCTPCT in the last 72 hours. ------------------------------------------------------------------------------------------------------------------ Cardiac Enzymes No results for input(s): CKMB, TROPONINI, MYOGLOBIN in the last 168 hours.  Invalid input(s): CK  Micro Results Recent Results (from the past 240 hours)  Culture, blood (Routine X 2) w Reflex to ID Panel     Status: None   Collection Time: 04/16/24 11:42 AM   Specimen: BLOOD  Result Value Ref Range Status   Specimen Description BLOOD SITE NOT SPECIFIED  Final   Special Requests   Final    BOTTLES DRAWN AEROBIC AND ANAEROBIC Blood Culture adequate volume   Culture   Final    NO GROWTH 5 DAYS Performed at [facility], [address], [place], [place] [address]    Report Status 04/21/2024 FINAL  Final  Culture, blood (Routine X 2) w Reflex to ID Panel     Status: None   Collection Time: 04/16/24  2:56 PM   Specimen: BLOOD RIGHT ARM  Result Value Ref Range Status   Specimen Description BLOOD RIGHT ARM  Final   Special Requests   Final    BOTTLES DRAWN AEROBIC AND ANAEROBIC Blood Culture adequate volume  Culture   Final    NO GROWTH 5 DAYS Performed at [facility], [address], [place], [place] [address]    Report Status  04/21/2024 FINAL  Final    Radiology Reports  DG Chest Port 1 View Result Date: 04/26/2024 EXAM: 1 VIEW(S) XRAY OF THE CHEST 04/25/2024 11:51:00 PM COMPARISON: 04/22/2024 CLINICAL HISTORY: SVT (supraventricular tachycardia) FINDINGS: LUNGS AND PLEURA: Stable elevation of the right hemidiaphragm. Left basilar calcified pleural plaque along the left hemidiaphragm again noted. Increasing atelectasis or infiltrate at the left lung base. Pulmonary vascularity is normal. No pleural effusion. No pneumothorax. HEART AND MEDIASTINUM: Aortic atherosclerosis. Cardiac size within normal limits. BONES AND SOFT TISSUES: No acute osseous abnormality. IMPRESSION: 1. Increasing atelectasis or infiltrate at the left lung base. 2. Stable elevation of the right hemidiaphragm. Electronically signed by: Dorethia Molt MD 04/26/2024 12:00 AM EST RP Workstation: [id number]       Signature  -   Lavada Stank M.D on 04/26/2024 at 9:04 AM   -  To page go to [email or web address]        "

## 2024-04-26 NOTE — Progress Notes (Signed)
 [facility] night cross cover note:   I was notified by RN of the patient's request for a sleep aid. I subsequently placed order for prn melatonin for insomnia.     Newton Pigg, DO Hospitalist

## 2024-04-27 DIAGNOSIS — I471 Supraventricular tachycardia, unspecified: Secondary | ICD-10-CM | POA: Diagnosis not present

## 2024-04-27 LAB — PHOSPHORUS: Phosphorus: 2.9 mg/dL (ref 2.5–4.6)

## 2024-04-27 LAB — CBC WITH DIFFERENTIAL/PLATELET
Abs Immature Granulocytes: 0.01 K/uL (ref 0.00–0.07)
Basophils Absolute: 0 K/uL (ref 0.0–0.1)
Basophils Relative: 0 %
Eosinophils Absolute: 0.2 K/uL (ref 0.0–0.5)
Eosinophils Relative: 7 %
HCT: 23.6 % — ABNORMAL LOW (ref 36.0–46.0)
Hemoglobin: 7.9 g/dL — ABNORMAL LOW (ref 12.0–15.0)
Immature Granulocytes: 0 %
Lymphocytes Relative: 45 %
Lymphs Abs: 1.2 K/uL (ref 0.7–4.0)
MCH: 31.2 pg (ref 26.0–34.0)
MCHC: 33.5 g/dL (ref 30.0–36.0)
MCV: 93.3 fL (ref 80.0–100.0)
Monocytes Absolute: 0.3 K/uL (ref 0.1–1.0)
Monocytes Relative: 10 %
Neutro Abs: 1 K/uL — ABNORMAL LOW (ref 1.7–7.7)
Neutrophils Relative %: 38 %
Platelets: 42 K/uL — ABNORMAL LOW (ref 150–400)
RBC: 2.53 MIL/uL — ABNORMAL LOW (ref 3.87–5.11)
RDW: 18.9 % — ABNORMAL HIGH (ref 11.5–15.5)
WBC: 2.6 K/uL — ABNORMAL LOW (ref 4.0–10.5)
nRBC: 0 % (ref 0.0–0.2)

## 2024-04-27 LAB — BASIC METABOLIC PANEL WITH GFR
Anion gap: 6 (ref 5–15)
BUN: <5 mg/dL — ABNORMAL LOW (ref 8–23)
CO2: 21 mmol/L — ABNORMAL LOW (ref 22–32)
Calcium: 7.9 mg/dL — ABNORMAL LOW (ref 8.9–10.3)
Chloride: 108 mmol/L (ref 98–111)
Creatinine, Ser: 0.45 mg/dL (ref 0.44–1.00)
GFR, Estimated: >60 mL/min
Glucose, Bld: 74 mg/dL (ref 70–99)
Potassium: 3.7 mmol/L (ref 3.5–5.1)
Sodium: 135 mmol/L (ref 135–145)

## 2024-04-27 LAB — GLUCOSE, CAPILLARY
Glucose-Capillary: 74 mg/dL (ref 70–99)
Glucose-Capillary: 75 mg/dL (ref 70–99)
Glucose-Capillary: 117 mg/dL — ABNORMAL HIGH (ref 70–99)
Glucose-Capillary: 109 mg/dL — ABNORMAL HIGH (ref 70–99)
Glucose-Capillary: 77 mg/dL (ref 70–99)

## 2024-04-27 LAB — MAGNESIUM: Magnesium: 1.8 mg/dL (ref 1.7–2.4)

## 2024-04-27 NOTE — Plan of Care (Signed)
  Problem: Clinical Measurements: Goal: Ability to maintain clinical measurements within normal limits will improve Outcome: Progressing Goal: Diagnostic test results will improve Outcome: Progressing Goal: Respiratory complications will improve Outcome: Progressing Goal: Cardiovascular complication will be avoided Outcome: Progressing   Problem: Nutrition: Goal: Adequate nutrition will be maintained Outcome: Progressing   Problem: Coping: Goal: Level of anxiety will decrease Outcome: Progressing   Problem: Elimination: Goal: Will not experience complications related to bowel motility Outcome: Progressing Goal: Will not experience complications related to urinary retention Outcome: Progressing   Problem: Pain Managment: Goal: General experience of comfort will improve and/or be controlled Outcome: Progressing   Problem: Safety: Goal: Ability to remain free from injury will improve Outcome: Progressing   Problem: Skin Integrity: Goal: Risk for impaired skin integrity will decrease Outcome: Progressing

## 2024-04-27 NOTE — Progress Notes (Signed)
 Physical Therapy Treatment Patient Details Name: [name] MRN: [id number] DOB: 11/28/1945 Today's Date: 04/27/2024   History of Present Illness Pt is a 78 y/o female presenting 04/14/24 after roommate called EMS as the patient was unresponsive. She reports chest pain and was very confused. Pt found under single blanket, alone with no heat or lights on in home. PMHx:  HTN, HLD, CVA left hemiparesis, CAPD, DVT, IVH. UA positive.    PT Comments  Session focused on progressing standing tolerance to promote functional strength. Bed mobility completed at beginning of session for peri care and bed change due to full saturation upon arrival. Pt requires modA for rolling and does well maintaining side lying position with use of bed rails. Pt dependent for peri care. Pt maxAx2 for supine to sit and demonstrates increased L lean. Pt uses R UE to attempt to balance self but ends up pushing more to the L when using the bed rail. STS x2 completed during session with pt tolerating ~20s in stance each attempt prior to sitting due to fatigue. Pt would benefit from continued PT services focused on bed mobility, trunk control, and transfers to promote upright functional mobility.    If plan is discharge home, recommend the following: A lot of help with walking and/or transfers;A lot of help with bathing/dressing/bathroom;Assistance with cooking/housework;Direct supervision/assist for medications management;Direct supervision/assist for financial management;Assist for transportation;Help with stairs or ramp for entrance;Supervision due to cognitive status   Can travel by private vehicle     No  Equipment Recommendations  Rolling walker (2 wheels);Wheelchair (measurements PT);Wheelchair cushion (measurements PT);Hoyer lift;BSC/3in1    Recommendations for Other Services       Precautions / Restrictions Precautions Precautions: Fall Recall of Precautions/Restrictions: Impaired Restrictions Weight Bearing  Restrictions Per Provider Order: No     Mobility  Bed Mobility Overal bed mobility: Needs Assistance Bed Mobility: Rolling, Supine to Sit, Sit to Supine Rolling: Mod assist (Assist L UE and LE when rolling to R. Cuing for hand placement when rolling to the L. Does well holding position using bed rails.)   Supine to sit: Max assist, +2 for physical assistance Sit to supine: Mod assist (LE management, cues for trunk position)   General bed mobility comments: Rolling performed at beginning of session for peri care and bed change.    Transfers Overall transfer level: Needs assistance Equipment used: 2 person hand held assist Transfers: Sit to/from Stand Sit to Stand: Max assist, +2 physical assistance           General transfer comment: Pt demonstrates good quad activation on R LE in standing. Pt with forward trunk position but improves with cues to bring chest up. Pt tolerates standing ~20s prior to requesting to sit down. STS x2 completed during session. Knee blocking required on L side.    Ambulation/Gait                   Stairs             Wheelchair Mobility     Tilt Bed    Modified Rankin (Stroke Patients Only)       Balance Overall balance assessment: Needs assistance Sitting-balance support: Single extremity supported Sitting balance-Leahy Scale: Fair Sitting balance - Comments: Increased L trunk lean noted during session. Pt holds tight to railing with R UE to support self but ends up pushing to the L. Postural control: Left lateral lean Standing balance support: Bilateral upper extremity supported Standing balance-Leahy Scale: Zero Standing balance comment:  Pt unable to support self in standing due to weakness. Max knee blocking required on L LE. MaxAx2 for standing balance.                            Communication Communication Communication: Impaired Factors Affecting Communication: Hearing impaired (Much better clarify of  speech today)  Cognition Arousal: Alert Behavior During Therapy: WFL for tasks assessed/performed   PT - Cognitive impairments: Awareness, Attention, Safety/Judgement, Problem solving, Sequencing                       PT - Cognition Comments: Pt responding to questions in order sentences today. Largest amount of communication during a session, comments appropriate for questions being asked. Following commands: Intact Following commands impaired: Only follows one step commands consistently    Cueing Cueing Techniques: Verbal cues, Tactile cues, Visual cues  Exercises      General Comments General comments (skin integrity, edema, etc.): VSS throughout. Dime sized wound noted in gluteal fold during peri care. No additional skin abnormalities noted.      Pertinent Vitals/Pain Pain Assessment Pain Assessment: Faces Faces Pain Scale: Hurts little more Pain Location: L hip and arm Pain Descriptors / Indicators: Aching, Sore, Sharp, Grimacing Pain Intervention(s): Limited activity within patient's tolerance, Repositioned, Monitored during session    Home Living                          Prior Function            PT Goals (current goals can now be found in the care plan section) Progress towards PT goals: Progressing toward goals    Frequency    Min 2X/week      PT Plan      Co-evaluation              AM-PAC PT 6 Clicks Mobility   Outcome Measure  Help needed turning from your back to your side while in a flat bed without using bedrails?: A Lot Help needed moving from lying on your back to sitting on the side of a flat bed without using bedrails?: A Lot Help needed moving to and from a bed to a chair (including a wheelchair)?: Total Help needed standing up from a chair using your arms (e.g., wheelchair or bedside chair)?: A Lot Help needed to walk in hospital room?: Total Help needed climbing 3-5 steps with a railing? : Total 6 Click Score:  9    End of Session Equipment Utilized During Treatment: Gait belt Activity Tolerance: Patient tolerated treatment well Patient left: in bed;with call bell/phone within reach;with bed alarm set;with nursing/sitter in room Nurse Communication: Mobility status PT Visit Diagnosis: Unsteadiness on feet (R26.81);Muscle weakness (generalized) (M62.81) Hemiplegia - Right/Left: Left Hemiplegia - dominant/non-dominant: Non-dominant Hemiplegia - caused by: Cerebral infarction     Time: 1204-1236 PT Time Calculation (min) (ACUTE ONLY): 32 min  Charges:    $Therapeutic Activity: 23-37 mins PT General Charges $$ ACUTE PT VISIT: 1 Visit                     Sabra Morel, PT, DPT  Acute Rehabilitation Services         Office: [phone]      Sabra MARLA Morel 04/27/2024, 1:14 PM

## 2024-04-27 NOTE — Progress Notes (Signed)
 "                        PROGRESS NOTE        PATIENT DETAILS Name: [name] Age: 78 y.o. Sex: female Date of Birth: 1946/01/13 Admit Date: 04/13/2024 Admitting Physician Tamela Stakes, MD ERE:Bzmryzrx, Rojelio PARAS, NP  Brief Summary: Patient is a 78 y.o.  female with history of CVA, COPD, HTN-who was brought to the ED after she was found by her roommate unresponsive (no heat/light-under a single blanket)-she was apparently covered in feces/dried blood-found to be hypothermic/hypotensive/hypoglycemic-she was thought to have undifferentiated shock-acute liver failure-acute kidney injury-admitted to the ICU-stabilized with pressors/D10 infusion/broad-spectrum antibiotics-rapidly improved and subsequently transferred to [facility].  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 12/8>> ANA: Positive 12/8>> anti-smooth muscle antibody: Negative  Significant microbiology data: 12/6>> COVID/influenza/RSV PCR: Negative 12/6>> blood culture: No growth 12/6>> acute hepatitis  serology: Negative 12/6>> CMV DNA PCR: Negative 12/9>> blood culture: No growth  Procedures: None  Consults: PCCM GI ID Hematology [name])  Subjective:  Patient in bed, appears comfortable, denies any headache, no fever, no chest pain or pressure, no shortness of breath , no abdominal pain. No new focal weakness.   Objective: Vitals: Blood pressure (!) 149/72, pulse 64, temperature 97.7 F (36.5 C), temperature source Axillary, resp. rate 20, height 5' 10 (1.778 m), weight 84 kg, SpO2 97%.   Exam:  Awake Alert, No new F.N deficits, left-sided weakness from stroke Roann.AT,PERRAL Supple Neck, No JVD,   Symmetrical Chest wall movement, Good air movement bilaterally, CTAB RRR,No Gallops, Rubs or new Murmurs,  +ve B.Sounds, Abd Soft, No tenderness,   No Cyanosis, Clubbing or edema    Assessment/Plan:  Undifferentiated shock Unclear etiology-initially thought to have septic shock but all cultures negative to date-has rapidly improved-initially on admission to the ICU was started on vancomycin /Zosyn  but all antibiotics were discontinued on 12/7.  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  Plans were to obtain TEE but per cardiology-unable to safely perform TEE unless platelet count persistently above 50K.  Will need to notify cardiology once platelet counts are persistently above 50K.  Thrombocytopenia Likely secondary to acute liver injury/failure-possible sepsis physiology-however no improvement in spite of improvement in her sepsis physiology and liver injury.  No evidence of TTP on peripheral smear-unclear if this is ITP.  Given continued transfusions as needed last on 04/20/2023.   Case discussed with hematology on-call-Dr. Gudina on 04/21/2024, no further platelet transfusions today, Nplate   started on 04/21/2024 and we will be getting second dose on 04/26/2024, hematology following, 1  unit of platelet transfusion due to persistent severe thrombocytopenia on 04/22/2024, discussed with hematology on 04/21/2024,04/22/2024, 04/26/2024.  Normocytic anemia Likely secondary to critical illness Continue to trend/follow CBC, per hematology 1 unit of packed RBC on 04/21/2024.  History of SVT night of 04/22/2024, seen by cardiology, resolved after adenosine .  Placed on low-dose beta-blocker, currently stable, stable TSH continue to monitor.  Acute metabolic encephalopathy Secondary to hypothermia/hypoglycemia/undifferentiated shock/acute liver failure Head CT without acute abnormalities Seems to have clinically improved after treatment of underlying etiologies.  Hypothermia Likely secondary to environmental exposure (per prior notes- cool room with single blanket-no heating or light) TSH stable Now normothermic after supportive care  Acute liver failure Unclear etiology-possibly shock liver from hypotension from possible sepsis physiology LFTs/coags improving Dopplers without portal/hepatic vein thrombosis Acute hepatitis serology negative CMV IgM positive but viral load negative  Negative workup for autoimmune hepatitis GI following-completed N-acetylcysteine  12/10 Empirically on lactulose   Oropharyngeal dysphagia Secondary to critical illness/encephalopathy SLP following-on dysphagia 2 diet.  AKI Hemodynamically mediated due to hypotension Resolved.  Hypokalemia/Hypophosphatemia Repleted  Metabolic acidosis Likely due to delayed clearance of lactic acid-in the setting of liver failure. Improved with supportive care  Self-limited episode-small-volume coffee-ground emesis Hb stable PPI GI following-no endoscopic procedures planned at this point  Chest pain Likely atypical Troponins minimally elevated-echo with stable EF Supportive care for now  Minimally  elevated troponins Trend is flat Likely demand ischemia in the setting of hypotension/critical illness. Echo with able EF  HTN BP stable, low-dose beta-blocker and Norvasc .  History of chronic HFpEF Status stable  History of CVA Baseline left hemiparesis Antiplatelets/statin held-in the setting of elevated liver enzymes and thrombocytopenia.  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  Lab Results  Component Value Date   HGBA1C 5.3 09/11/2020   Lab Results  Component Value Date   TSH 3.370 04/23/2024    CBG (last 3)  Recent Labs    04/26/24 2320 04/27/24 0340 04/27/24 0808  GLUCAP 97 74 75     Pressure Ulcer: Agree with assessment and plan as outlined below Wound 04/14/24 0100 Pressure Injury Hip Left Stage 2 -  Partial thickness loss of dermis presenting as a shallow open injury with a red, pink wound bed without slough. (Active)     Wound 04/14/24 0100 Pressure Injury Buttocks Left;Right Unstageable - Full thickness tissue loss in which the base of the injury is covered by slough (yellow, tan, gray, green or brown) and/or eschar (tan, brown or black) in the wound bed. (Active)     Wound 04/14/24 0100 Pressure Injury Sacrum Medial Stage 2 -  Partial thickness loss of dermis presenting as a shallow open injury with a red, pink wound bed without slough. (Active)    Code status:   Code  Status: Full Code   DVT Prophylaxis: SCDs Start: 04/13/24 2344 Avoid pharmacological prophylaxis-given severity of thrombocytopenia   Family Communication: Daughter-[phone] left VM 12/10,12/12.12/13   Disposition Plan: Status is: Inpatient Remains inpatient appropriate  because: Severity of illness   Planned Discharge Destination:Skilled nursing facility   Diet: Diet Order             DIET DYS 3 Room service appropriate? Yes with Assist; Fluid consistency: Thin  Diet effective now                     Data Review:   Patient Lines/Drains/Airways Status     Active Line/Drains/Airways     Name Placement date Placement time Site Days   Peripheral IV 04/22/24 20 G 2.5 Anterior;Right;Upper Arm 04/22/24  2239  Arm  5   External Urinary Catheter 04/26/24  2015  --  1   Wound 04/14/24 0100 Pressure Injury Hip Left Stage 2 -  Partial thickness loss of dermis presenting as a shallow open injury with a red, pink wound bed without slough. 04/14/24  0100  Hip  13   Wound 04/14/24 0100 Pressure Injury Buttocks Left;Right Unstageable - Full thickness tissue loss in which the base of the injury is covered by slough (yellow, tan, gray, green or brown) and/or eschar (tan, brown or black) in the wound bed. 04/14/24  0100  Buttocks  13   Wound 04/14/24 0100 Pressure Injury Sacrum Medial Stage 2 -  Partial thickness loss of dermis presenting as a shallow open injury with a red, pink wound bed without slough. 04/14/24  0100  Sacrum  13             Inpatient Medications  Scheduled Meds:  amLODipine   10 mg Oral Daily   feeding supplement  237 mL Oral BID BM   Gerhardt's butt cream   Topical BID   lactulose   20 g Oral TID   metoprolol  tartrate  12.5 mg Oral BID   multivitamin with minerals  1 tablet Oral Daily   mouth rinse  15 mL Mouth Rinse 4 times per day   pantoprazole   40 mg Oral BID   romiPLOStim   3 mcg/kg Subcutaneous Weekly   Continuous Infusions:  cefTRIAXone   (ROCEPHIN )  IV Stopped (04/26/24 1400)   DAPTOmycin  Stopped (04/26/24 1530)   PRN Meds:.dextrose , docusate, hydrALAZINE , HYDROcodone -acetaminophen , ipratropium-albuterol , melatonin, metoprolol  tartrate, morphine  injection, ondansetron  (ZOFRAN ) IV, mouth rinse, polyethylene glycol  DVT Prophylaxis  SCDs Start: 04/13/24 2344   Recent Labs  Lab 04/23/24 0351 04/24/24 0328 04/25/24 0346 04/26/24 0711 04/27/24 0456  WBC 4.1 3.6* 3.1* 3.1* 2.6*  HGB 9.8* 8.8* 8.1* 8.0* 7.9*  HCT 30.1* 26.3* 24.6* 24.3* 23.6*  PLT 27* 37* 38* 33* 42*  MCV 94.1 93.6 93.9 94.9 93.3  MCH 30.6 31.3 30.9 31.3 31.2  MCHC 32.6 33.5 32.9 32.9 33.5  RDW 17.7* 17.7* 18.1* 18.7* 18.9*  LYMPHSABS 1.2 0.6* 1.4 1.3 1.2  MONOABS 0.3 0.1 0.3 0.2 0.3  EOSABS 0.2 0.0 0.2 0.2 0.2  BASOSABS 0.0 0.0 0.0 0.0 0.0    Recent Labs  Lab 04/21/24 0408 04/22/24 0259 04/22/24 2342 04/23/24 0351 04/24/24 0328 04/25/24 0346 04/26/24 0014 04/26/24 0432 04/26/24 0711 04/27/24 0456  NA 131* 132* 135 134* 133* 132*  --   --  131* 135  K 3.5 3.7 3.5 3.8 3.9 4.0  --   --  3.8 3.7  CL 101 104 105 105 106 105  --   --  106 108  CO2 23 24 24 24  21* 22  --   --  18* 21*  ANIONGAP 7 4* 6 5 7 5   --   --  8 6  GLUCOSE 74  71 93 97 70 65*  --   --  75 74  BUN 10 9 10 11 10 10   --   --  7* <5*  CREATININE 0.49 0.56 0.57 0.57 0.52 0.49  --   --  0.50 0.45  AST 73* 67* 68* 71* 64* 57*  --   --   --   --   ALT 141* 119* 103* 105* 100* 81*  --   --   --   --   ALKPHOS 102 99 105 110 114 108  --   --   --   --   BILITOT 1.1 1.2 1.1 1.4* 1.0 0.7  --   --   --   --   ALBUMIN  1.6* 1.8* 1.9* 2.1* 2.2* 2.3*  --   --   --   --   TSH  --   --   --  3.370  --   --   --   --   --   --   BNP  --   --  227.1*  --   --   --   --   --   --   --   MG 1.6* 2.0 1.7 1.8 1.9  --  1.6* 2.1  --  1.8  PHOS 2.4* 2.8  --  2.4* 2.5  --   --   --   --  2.9  CALCIUM  7.4* 7.3* 7.4* 7.6* 8.0* 8.0*  --   --  7.8* 7.9*      Recent Labs  Lab 04/22/24 2342  04/23/24 0351 04/24/24 0328 04/25/24 0346 04/26/24 0014 04/26/24 0432 04/26/24 0711 04/27/24 0456  TSH  --  3.370  --   --   --   --   --   --   BNP 227.1*  --   --   --   --   --   --   --   MG 1.7 1.8 1.9  --  1.6* 2.1  --  1.8  CALCIUM  7.4* 7.6* 8.0* 8.0*  --   --  7.8* 7.9*    --------------------------------------------------------------------------------------------------------------- Lab Results  Component Value Date   CHOL 288 (H) 05/30/2023   HDL 78 05/30/2023   LDLCALC 165 (H) 05/30/2023   TRIG 248 (H) 05/30/2023   CHOLHDL 3.7 05/30/2023    Lab Results  Component Value Date   HGBA1C 5.3 09/11/2020   No results for input(s): TSH, T4TOTAL, FREET4, T3FREE, THYROIDAB in the last 72 hours.  No results for input(s): VITAMINB12, FOLATE, FERRITIN, TIBC, IRON, RETICCTPCT in the last 72 hours. ------------------------------------------------------------------------------------------------------------------ Cardiac Enzymes No results for input(s): CKMB, TROPONINI, MYOGLOBIN in the last 168 hours.  Invalid input(s): CK  Micro Results No results found for this or any previous visit (from the past 240 hours).   Radiology Reports  DG Chest Port 1 View Result Date: 04/26/2024 EXAM: 1 VIEW(S) XRAY OF THE CHEST 04/25/2024 11:51:00 PM COMPARISON: 04/22/2024 CLINICAL HISTORY: SVT (supraventricular tachycardia) FINDINGS: LUNGS AND PLEURA: Stable elevation of the right hemidiaphragm. Left basilar calcified pleural plaque along the left hemidiaphragm again noted. Increasing atelectasis or infiltrate at the left lung base. Pulmonary vascularity is normal. No pleural effusion. No pneumothorax. HEART AND MEDIASTINUM: Aortic atherosclerosis. Cardiac size within normal limits. BONES AND SOFT TISSUES: No acute osseous abnormality. IMPRESSION: 1. Increasing atelectasis or infiltrate at the left lung base. 2. Stable elevation of the right hemidiaphragm.  Electronically signed by: Dorethia Molt MD 04/26/2024 12:00 AM EST RP Workstation: [id number]  Signature  -   Lavada Stank M.D on 04/27/2024 at 10:22 AM   -  To page go to [email or web address]        "

## 2024-04-27 NOTE — Plan of Care (Signed)

## 2024-04-27 NOTE — Plan of Care (Signed)
" °  Problem: Education: Goal: Knowledge of General Education information will improve Description: Including pain rating scale, medication(s)/side effects and non-pharmacologic comfort measures Outcome: Progressing   Problem: Activity: Goal: Risk for activity intolerance will decrease Outcome: Not Progressing   Problem: Nutrition: Goal: Adequate nutrition will be maintained Outcome: Progressing   Problem: Elimination: Goal: Will not experience complications related to bowel motility Outcome: Progressing   Problem: Pain Managment: Goal: General experience of comfort will improve and/or be controlled Outcome: Not Progressing   Problem: Skin Integrity: Goal: Risk for impaired skin integrity will decrease Outcome: Progressing   "

## 2024-04-27 NOTE — Progress Notes (Addendum)
 Pt went into SVT HR 147. Pt denied CP, discomfort. VSS. Meteprolol 2.5 mg IV given per prn order. HR came down to 53 after a dose of IV metoprolol .

## 2024-04-28 DIAGNOSIS — I471 Supraventricular tachycardia, unspecified: Secondary | ICD-10-CM | POA: Diagnosis not present

## 2024-04-28 LAB — CBC WITH DIFFERENTIAL/PLATELET
Abs Immature Granulocytes: 0.01 K/uL (ref 0.00–0.07)
Basophils Absolute: 0 K/uL (ref 0.0–0.1)
Basophils Relative: 1 %
Eosinophils Absolute: 0.2 K/uL (ref 0.0–0.5)
Eosinophils Relative: 7 %
HCT: 23 % — ABNORMAL LOW (ref 36.0–46.0)
Hemoglobin: 7.7 g/dL — ABNORMAL LOW (ref 12.0–15.0)
Immature Granulocytes: 0 %
Lymphocytes Relative: 53 %
Lymphs Abs: 1.5 K/uL (ref 0.7–4.0)
MCH: 31.6 pg (ref 26.0–34.0)
MCHC: 33.5 g/dL (ref 30.0–36.0)
MCV: 94.3 fL (ref 80.0–100.0)
Monocytes Absolute: 0.2 K/uL (ref 0.1–1.0)
Monocytes Relative: 8 %
Neutro Abs: 0.9 K/uL — ABNORMAL LOW (ref 1.7–7.7)
Neutrophils Relative %: 31 %
Platelets: 46 K/uL — ABNORMAL LOW (ref 150–400)
RBC: 2.44 MIL/uL — ABNORMAL LOW (ref 3.87–5.11)
RDW: 19.4 % — ABNORMAL HIGH (ref 11.5–15.5)
Smear Review: NORMAL
WBC: 2.8 K/uL — ABNORMAL LOW (ref 4.0–10.5)
nRBC: 0 % (ref 0.0–0.2)

## 2024-04-28 LAB — BASIC METABOLIC PANEL WITH GFR
Anion gap: 7 (ref 5–15)
BUN: 7 mg/dL — ABNORMAL LOW (ref 8–23)
CO2: 21 mmol/L — ABNORMAL LOW (ref 22–32)
Calcium: 7.9 mg/dL — ABNORMAL LOW (ref 8.9–10.3)
Chloride: 107 mmol/L (ref 98–111)
Creatinine, Ser: 0.5 mg/dL (ref 0.44–1.00)
GFR, Estimated: >60 mL/min
Glucose, Bld: 89 mg/dL (ref 70–99)
Potassium: 3.4 mmol/L — ABNORMAL LOW (ref 3.5–5.1)
Sodium: 136 mmol/L (ref 135–145)

## 2024-04-28 LAB — HEMOGLOBIN AND HEMATOCRIT, BLOOD
HCT: 30.2 % — ABNORMAL LOW (ref 36.0–46.0)
Hemoglobin: 10.1 g/dL — ABNORMAL LOW (ref 12.0–15.0)

## 2024-04-28 LAB — GLUCOSE, CAPILLARY
Glucose-Capillary: 101 mg/dL — ABNORMAL HIGH (ref 70–99)
Glucose-Capillary: 105 mg/dL — ABNORMAL HIGH (ref 70–99)
Glucose-Capillary: 105 mg/dL — ABNORMAL HIGH (ref 70–99)
Glucose-Capillary: 123 mg/dL — ABNORMAL HIGH (ref 70–99)
Glucose-Capillary: 62 mg/dL — ABNORMAL LOW (ref 70–99)
Glucose-Capillary: 72 mg/dL (ref 70–99)
Glucose-Capillary: 74 mg/dL (ref 70–99)

## 2024-04-28 LAB — PREPARE RBC (CROSSMATCH)

## 2024-04-28 LAB — MAGNESIUM: Magnesium: 1.8 mg/dL (ref 1.7–2.4)

## 2024-04-28 LAB — PHOSPHORUS: Phosphorus: 2.8 mg/dL (ref 2.5–4.6)

## 2024-04-28 MED ORDER — POTASSIUM CHLORIDE CRYS ER 20 MEQ PO TBCR
40.0000 meq | EXTENDED_RELEASE_TABLET | Freq: Once | ORAL | Status: AC
  Administered 2024-04-28: 40 meq via ORAL
  Filled 2024-04-28: qty 2

## 2024-04-28 MED ORDER — SODIUM CHLORIDE 0.9% IV SOLUTION: Freq: Once | INTRAVENOUS | Status: AC

## 2024-04-28 MED ORDER — MAGNESIUM SULFATE 2 GM/50ML IV SOLN
2.0000 g | Freq: Once | INTRAVENOUS | Status: AC
  Administered 2024-04-28: 2 g via INTRAVENOUS
  Filled 2024-04-28: qty 50

## 2024-04-28 MED ORDER — MIRTAZAPINE 15 MG PO TBDP
15.0000 mg | ORAL_TABLET | Freq: Every day | ORAL | Status: DC
  Administered 2024-04-28 – 2024-05-02 (×4): 15 mg via ORAL
  Filled 2024-04-28 (×4): qty 1

## 2024-04-28 NOTE — Plan of Care (Signed)
  Problem: Clinical Measurements: Goal: Ability to maintain clinical measurements within normal limits will improve Outcome: Progressing Goal: Will remain free from infection Outcome: Progressing Goal: Diagnostic test results will improve Outcome: Progressing Goal: Respiratory complications will improve Outcome: Progressing Goal: Cardiovascular complication will be avoided Outcome: Progressing   Problem: Coping: Goal: Level of anxiety will decrease Outcome: Progressing   Problem: Elimination: Goal: Will not experience complications related to bowel motility Outcome: Progressing Goal: Will not experience complications related to urinary retention Outcome: Progressing   Problem: Pain Managment: Goal: General experience of comfort will improve and/or be controlled Outcome: Progressing   Problem: Safety: Goal: Ability to remain free from injury will improve Outcome: Progressing   Problem: Skin Integrity: Goal: Risk for impaired skin integrity will decrease Outcome: Progressing

## 2024-04-28 NOTE — Progress Notes (Signed)
 "                        PROGRESS NOTE        PATIENT DETAILS Name: [name] Age: 78 y.o. Sex: female Date of Birth: Dec 27, 1945 Admit Date: 04/13/2024 Admitting Physician Tamela Stakes, MD ERE:Bzmryzrx, Rojelio PARAS, NP  Brief Summary: Patient is a 78 y.o.  female with history of CVA, COPD, HTN-who was brought to the ED after she was found by her roommate unresponsive (no heat/light-under a single blanket)-she was apparently covered in feces/dried blood-found to be hypothermic/hypotensive/hypoglycemic-she was thought to have undifferentiated shock-acute liver failure-acute kidney injury-admitted to the ICU-stabilized with pressors/D10 infusion/broad-spectrum antibiotics-rapidly improved and subsequently transferred to [facility].  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 12/8>> ANA: Positive 12/8>> anti-smooth muscle antibody: Negative  Significant microbiology data: 12/6>> COVID/influenza/RSV PCR: Negative 12/6>> blood culture: No growth 12/6>> acute hepatitis  serology: Negative 12/6>> CMV DNA PCR: Negative 12/9>> blood culture: No growth  Procedures: None  Consults: PCCM GI ID Hematology [name])  Subjective:  Patient in bed, appears comfortable, denies any headache, no fever, no chest pain or pressure, no shortness of breath , no abdominal pain. No new focal weakness.   Objective: Vitals: Blood pressure 131/68, pulse (!) 52, temperature 97.9 F (36.6 C), temperature source Axillary, resp. rate 18, height 5' 10 (1.778 m), weight 80.4 kg, SpO2 99%.   Exam:  Awake Alert, No new F.N deficits, left-sided weakness from stroke [place].AT,PERRAL Supple Neck, No JVD,   Symmetrical Chest wall movement, Good air movement bilaterally, CTAB RRR,No Gallops, Rubs or new Murmurs,  +ve B.Sounds, Abd Soft, No tenderness,   No Cyanosis, Clubbing or edema    Assessment/Plan:  Undifferentiated shock Unclear etiology-initially thought to have septic shock but all cultures negative to date-has rapidly improved-initially on admission to the ICU was started on vancomycin /Zosyn  but all antibiotics were discontinued on 12/7.  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  Plans were to obtain TEE but per cardiology-unable to safely perform TEE unless platelet count persistently above 50K.  Will need to notify cardiology once platelet counts are persistently above 50K.  Thrombocytopenia Likely secondary to acute liver injury/failure-possible sepsis physiology-however no improvement in spite of improvement in her sepsis physiology and liver injury.  No evidence of TTP on peripheral smear-unclear if this is ITP.  Given continued transfusions as needed last on 04/20/2023.   Case discussed with hematology on-call-Dr. Gudina on 04/21/2024, no further platelet transfusions today, Nplate   started on 04/21/2024 and we will be getting second dose on 04/26/2024, hematology following, 1  unit of platelet transfusion due to persistent severe thrombocytopenia on 04/22/2024, discussed with hematology on 04/21/2024,04/22/2024, 04/26/2024.  Normocytic anemia Likely secondary to critical illness Continue to trend/follow CBC, she has thus far received 1 unit of packed RBC on 04/21/2024, second unit on 04/28/2024.  No ongoing bleed.  History of SVT night of 04/22/2024, seen by cardiology, resolved after adenosine .  Placed on low-dose beta-blocker, currently stable, stable TSH continue to monitor.  Acute metabolic encephalopathy Secondary to hypothermia/hypoglycemia/undifferentiated shock/acute liver failure Head CT without acute abnormalities Seems to have clinically improved after treatment of underlying etiologies.  Hypothermia Likely secondary to environmental exposure (per prior notes- cool room with single blanket-no heating or light) TSH stable Now normothermic after supportive care  Acute liver failure Unclear etiology-possibly shock liver from hypotension from possible sepsis physiology LFTs/coags improving Dopplers without portal/hepatic vein thrombosis Acute hepatitis serology negative CMV IgM positive but viral load negative  Negative workup for autoimmune hepatitis GI following-completed N-acetylcysteine  12/10 Empirically on lactulose   Oropharyngeal dysphagia Secondary to critical illness/encephalopathy SLP following-on dysphagia 2 diet.  AKI Hemodynamically mediated due to hypotension Resolved.  Hypokalemia/Hypophosphatemia Repleted  Metabolic acidosis Likely due to delayed clearance of lactic acid-in the setting of liver failure. Improved with supportive care  Self-limited episode-small-volume coffee-ground emesis Hb stable PPI GI following-no endoscopic procedures planned at this point  Chest pain Likely atypical Troponins minimally elevated-echo  with stable EF Supportive care for now  Minimally elevated troponins Trend is flat Likely demand ischemia in the setting of hypotension/critical illness. Echo with able EF  HTN BP stable, low-dose beta-blocker and Norvasc .  History of chronic HFpEF Status stable  History of CVA Baseline left hemiparesis Antiplatelets/statin held-in the setting of elevated liver enzymes and thrombocytopenia.  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  Oral intake somewhat poor will add Remeron  at nighttime on 04/28/2024.  Lab Results  Component Value Date   HGBA1C 5.3 09/11/2020   Lab Results  Component Value Date   TSH 3.370 04/23/2024    CBG (last 3)  Recent Labs    04/28/24 0052 04/28/24 0443 04/28/24 0828  GLUCAP 72 105* 101*     Pressure Ulcer: Agree with assessment and plan as outlined below Wound 04/14/24 0100 Pressure Injury Hip Left Stage 2 -  Partial thickness loss of dermis presenting as a shallow open injury with a red, pink wound bed without slough. (Active)     Wound 04/14/24 0100 Pressure Injury Buttocks Left;Right Unstageable - Full thickness tissue loss in which the base of the injury is covered by slough (yellow, tan, gray, green or brown) and/or eschar (tan, brown or black) in the wound bed. (Active)     Wound 04/14/24 0100 Pressure Injury Sacrum Medial Stage 2 -  Partial thickness loss  of dermis presenting as a shallow open injury with a red, pink wound bed without slough. (Active)    Code status:   Code Status: Full Code   DVT Prophylaxis: SCDs Start: 04/13/24 2344 Avoid  pharmacological prophylaxis-given severity of thrombocytopenia   Family Communication: Daughter-Tiana-[phone] left VM 12/10,12/12.12/13   Disposition Plan: Status is: Inpatient Remains inpatient appropriate because: Severity of illness   Planned Discharge Destination:Skilled nursing facility   Diet: Diet Order             DIET DYS 3 Room service appropriate? Yes with Assist; Fluid consistency: Thin  Diet effective now                     Data Review:   Patient Lines/Drains/Airways Status     Active Line/Drains/Airways     Name Placement date Placement time Site Days   Peripheral IV 04/22/24 20 G 2.5 Anterior;Right;Upper Arm 04/22/24  2239  Arm  6   External Urinary Catheter 04/26/24  2015  --  2   Wound 04/14/24 0100 Pressure Injury Hip Left Stage 2 -  Partial thickness loss of dermis presenting as a shallow open injury with a red, pink wound bed without slough. 04/14/24  0100  Hip  14   Wound 04/14/24 0100 Pressure Injury Buttocks Left;Right Unstageable - Full thickness tissue loss in which the base of the injury is covered by slough (yellow, tan, gray, green or brown) and/or eschar (tan, brown or black) in the wound bed. 04/14/24  0100  Buttocks  14   Wound 04/14/24 0100 Pressure Injury Sacrum Medial Stage 2 -  Partial thickness loss of dermis presenting as a shallow open injury with a red, pink wound bed without slough. 04/14/24  0100  Sacrum  14             Inpatient Medications  Scheduled Meds:  sodium chloride    Intravenous Once   amLODipine   10 mg Oral Daily   feeding supplement  237 mL Oral BID BM   Gerhardt's butt cream   Topical BID   lactulose   20 g Oral TID   metoprolol  tartrate  12.5 mg Oral BID   mirtazapine   15 mg Oral QHS   multivitamin with  minerals  1 tablet Oral Daily   mouth rinse  15 mL Mouth Rinse 4 times per day   pantoprazole   40 mg Oral BID   romiPLOStim   3 mcg/kg Subcutaneous Weekly   Continuous Infusions:  cefTRIAXone  (ROCEPHIN )  IV 2 g (04/27/24 1310)   DAPTOmycin  700 mg (04/27/24 1529)   PRN Meds:.dextrose , docusate, hydrALAZINE , HYDROcodone -acetaminophen , ipratropium-albuterol , melatonin, metoprolol  tartrate, morphine  injection, ondansetron  (ZOFRAN ) IV, mouth rinse, polyethylene glycol  DVT Prophylaxis  SCDs Start: 04/13/24 2344   Recent Labs  Lab 04/24/24 0328 04/25/24 0346 04/26/24 0711 04/27/24 0456 04/28/24 0413  WBC 3.6* 3.1* 3.1* 2.6* 2.8*  HGB 8.8* 8.1* 8.0* 7.9* 7.7*  HCT 26.3* 24.6* 24.3* 23.6* 23.0*  PLT 37* 38* 33* 42* 46*  MCV 93.6 93.9 94.9 93.3 94.3  MCH 31.3 30.9 31.3 31.2 31.6  MCHC 33.5 32.9 32.9 33.5 33.5  RDW 17.7* 18.1* 18.7* 18.9* 19.4*  LYMPHSABS 0.6* 1.4 1.3 1.2 1.5  MONOABS 0.1 0.3 0.2 0.3 0.2  EOSABS 0.0 0.2 0.2 0.2 0.2  BASOSABS 0.0 0.0 0.0 0.0 0.0    Recent Labs  Lab 04/22/24 0259 04/22/24 2342 04/23/24 0351 04/24/24 0328 04/25/24 0346 04/26/24 0014 04/26/24 0432 04/26/24 0711 04/27/24 0456 04/28/24 0413  NA 132* 135 134* 133* 132*  --   --  131* 135 136  K 3.7 3.5 3.8 3.9 4.0  --   --  3.8 3.7 3.4*  CL 104 105 105 106 105  --   --  106 108 107  CO2 24 24 24  21* 22  --   --  18* 21* 21*  ANIONGAP 4* 6 5 7 5   --   --  8 6 7   GLUCOSE 71 93 97 70 65*  --   --  75 74 89  BUN 9 10 11 10 10   --   --  7* <5* 7*  CREATININE 0.56 0.57 0.57 0.52 0.49  --   --  0.50 0.45 0.50  AST 67* 68* 71* 64* 57*  --   --   --   --   --   ALT 119* 103* 105* 100* 81*  --   --   --   --   --   ALKPHOS 99 105 110 114 108  --   --   --   --   --   BILITOT 1.2 1.1 1.4* 1.0 0.7  --   --   --   --   --   ALBUMIN  1.8* 1.9* 2.1* 2.2* 2.3*  --   --   --   --   --   TSH  --   --  3.370  --   --   --   --   --   --   --   BNP  --  227.1*  --   --   --   --   --   --   --   --   MG 2.0  1.7 1.8 1.9  --  1.6* 2.1  --  1.8 1.8  PHOS 2.8  --  2.4* 2.5  --   --   --   --  2.9 2.8  CALCIUM  7.3* 7.4* 7.6* 8.0* 8.0*  --   --  7.8* 7.9* 7.9*      Recent Labs  Lab 04/22/24 2342 04/23/24 0351 04/24/24 0328 04/25/24 0346 04/26/24 0014 04/26/24 0432 04/26/24 0711 04/27/24 0456 04/28/24 0413  TSH  --  3.370  --   --   --   --   --   --   --   BNP 227.1*  --   --   --   --   --   --   --   --   MG 1.7 1.8 1.9  --  1.6* 2.1  --  1.8 1.8  CALCIUM  7.4* 7.6* 8.0* 8.0*  --   --  7.8* 7.9* 7.9*    --------------------------------------------------------------------------------------------------------------- Lab Results  Component Value Date   CHOL 288 (H) 05/30/2023   HDL 78 05/30/2023   LDLCALC 165 (H) 05/30/2023   TRIG 248 (H) 05/30/2023   CHOLHDL 3.7 05/30/2023    Lab Results  Component Value Date   HGBA1C 5.3 09/11/2020   No results for input(s): TSH, T4TOTAL, FREET4, T3FREE, THYROIDAB in the last 72 hours.  No results for input(s): VITAMINB12, FOLATE, FERRITIN, TIBC, IRON, RETICCTPCT in the last 72 hours. ------------------------------------------------------------------------------------------------------------------ Cardiac Enzymes No results for input(s): CKMB, TROPONINI, MYOGLOBIN in the last 168 hours.  Invalid input(s): CK  Micro Results No results found for this or any previous visit (from the past 240 hours).   Radiology Reports  No results found.  Signature  -   Lavada Stank M.D on 04/28/2024 at 10:13 AM   -  To page go to [email or web address]  "

## 2024-04-28 NOTE — Progress Notes (Signed)
 Attending RN and I have attempted to feed patient break this AM and she declined PO intake. I even attempted to perform oral care and wash her face and she declined all care. Education provided. Patient still declined. MD aware.

## 2024-04-29 DIAGNOSIS — I471 Supraventricular tachycardia, unspecified: Secondary | ICD-10-CM | POA: Diagnosis not present

## 2024-04-29 LAB — MAGNESIUM: Magnesium: 2 mg/dL (ref 1.7–2.4)

## 2024-04-29 LAB — TYPE AND SCREEN
ABO/RH(D): B POS
Antibody Screen: NEGATIVE
Unit division: 0

## 2024-04-29 LAB — CBC WITH DIFFERENTIAL/PLATELET
Abs Immature Granulocytes: 0.01 K/uL (ref 0.00–0.07)
Basophils Absolute: 0 K/uL (ref 0.0–0.1)
Basophils Relative: 1 %
Eosinophils Absolute: 0.2 K/uL (ref 0.0–0.5)
Eosinophils Relative: 8 %
HCT: 30.9 % — ABNORMAL LOW (ref 36.0–46.0)
Hemoglobin: 10.1 g/dL — ABNORMAL LOW (ref 12.0–15.0)
Immature Granulocytes: 0 %
Lymphocytes Relative: 58 %
Lymphs Abs: 1.7 K/uL (ref 0.7–4.0)
MCH: 30.8 pg (ref 26.0–34.0)
MCHC: 32.7 g/dL (ref 30.0–36.0)
MCV: 94.2 fL (ref 80.0–100.0)
Monocytes Absolute: 0.2 K/uL (ref 0.1–1.0)
Monocytes Relative: 8 %
Neutro Abs: 0.7 K/uL — ABNORMAL LOW (ref 1.7–7.7)
Neutrophils Relative %: 25 %
Platelets: 52 K/uL — ABNORMAL LOW (ref 150–400)
RBC: 3.28 MIL/uL — ABNORMAL LOW (ref 3.87–5.11)
RDW: 20.9 % — ABNORMAL HIGH (ref 11.5–15.5)
Smear Review: NORMAL
WBC: 2.9 K/uL — ABNORMAL LOW (ref 4.0–10.5)
nRBC: 0 % (ref 0.0–0.2)

## 2024-04-29 LAB — BASIC METABOLIC PANEL WITH GFR
Anion gap: 9 (ref 5–15)
BUN: 7 mg/dL — ABNORMAL LOW (ref 8–23)
CO2: 19 mmol/L — ABNORMAL LOW (ref 22–32)
Calcium: 8.2 mg/dL — ABNORMAL LOW (ref 8.9–10.3)
Chloride: 107 mmol/L (ref 98–111)
Creatinine, Ser: 0.49 mg/dL (ref 0.44–1.00)
GFR, Estimated: >60 mL/min
Glucose, Bld: 55 mg/dL — ABNORMAL LOW (ref 70–99)
Potassium: 4.6 mmol/L (ref 3.5–5.1)
Sodium: 135 mmol/L (ref 135–145)

## 2024-04-29 LAB — BPAM RBC
Blood Product Expiration Date: 202601102359
ISSUE DATE / TIME: 202512211048
Unit Type and Rh: 7300

## 2024-04-29 LAB — PHOSPHORUS: Phosphorus: 2.7 mg/dL (ref 2.5–4.6)

## 2024-04-29 LAB — GLUCOSE, CAPILLARY
Glucose-Capillary: 104 mg/dL — ABNORMAL HIGH (ref 70–99)
Glucose-Capillary: 105 mg/dL — ABNORMAL HIGH (ref 70–99)
Glucose-Capillary: 109 mg/dL — ABNORMAL HIGH (ref 70–99)
Glucose-Capillary: 63 mg/dL — ABNORMAL LOW (ref 70–99)
Glucose-Capillary: 71 mg/dL (ref 70–99)
Glucose-Capillary: 77 mg/dL (ref 70–99)

## 2024-04-29 MED ORDER — QUETIAPINE FUMARATE 25 MG PO TABS: 25.0000 mg | ORAL_TABLET | Freq: Every day | ORAL | Status: DC | PRN

## 2024-04-29 MED ORDER — DEXTROSE 50 % IV SOLN
INTRAVENOUS | Status: AC
  Filled 2024-04-29: qty 50

## 2024-04-29 NOTE — Progress Notes (Signed)
 "                        PROGRESS NOTE        PATIENT DETAILS Name: [name] Age: 78 y.o. Sex: female Date of Birth: 1945-05-27 Admit Date: 04/13/2024 Admitting Physician Tamela Stakes, MD ERE:Bzmryzrx, Rojelio PARAS, NP  Brief Summary: Patient is a 78 y.o.  female with history of CVA, COPD, HTN-who was brought to the ED after she was found by her roommate unresponsive (no heat/light-under a single blanket)-she was apparently covered in feces/dried blood-found to be hypothermic/hypotensive/hypoglycemic-she was thought to have undifferentiated shock-acute liver failure-acute kidney injury-admitted to the ICU-stabilized with pressors/D10 infusion/broad-spectrum antibiotics-rapidly improved and subsequently transferred to [facility].  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 12/8>> ANA: Positive 12/8>> anti-smooth muscle antibody: Negative  Significant microbiology data: 12/6>> COVID/influenza/RSV PCR: Negative 12/6>> blood culture: No growth 12/6>> acute hepatitis  serology: Negative 12/6>> CMV DNA PCR: Negative 12/9>> blood culture: No growth  Procedures: None  Consults: PCCM GI ID Hematology [name])  Subjective:   Patient in bed, appears comfortable, denies any headache, no fever, no chest pain or pressure, no shortness of breath , no abdominal pain. No new focal weakness.    Objective: Vitals: Blood pressure (!) 138/57, pulse (!) 53, temperature 97.8 F (36.6 C), temperature source Axillary, resp. rate 18, height 5' 10 (1.778 m), weight 81.1 kg, SpO2 99%.   Exam:  Awake Alert, No new F.N deficits, left-sided weakness from stroke Prue.AT,PERRAL Supple Neck, No JVD,   Symmetrical Chest wall movement, Good air movement bilaterally, CTAB RRR,No Gallops, Rubs or new Murmurs,  +ve B.Sounds, Abd Soft, No tenderness,   No Cyanosis, Clubbing or edema    Assessment/Plan:  For the last several days patient has been refusing to eat, she has been very aggressive towards the staff and intermittently refusing medications, no family on board, will request psych to evaluate for capacity and if any medications need to be given for aggression.  She has been placed on Remeron  to augment her appetite.     Undifferentiated shock Unclear etiology-initially thought to have septic shock but all cultures negative to date-has rapidly improved-initially on admission to the ICU was started on vancomycin /Zosyn  but all antibiotics were discontinued on 12/7.  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  Plans were to obtain TEE but per cardiology-unable to safely perform TEE unless platelet count persistently above 50K.  Will need to notify cardiology once platelet counts are persistently above 50K.  Thrombocytopenia Likely secondary to acute liver injury/failure-possible sepsis  physiology-however no improvement in  spite of improvement in her sepsis physiology and liver injury.  No evidence of TTP on peripheral smear-unclear if this is ITP.  Given continued transfusions as needed last on 04/20/2023.   Case discussed with hematology on-call-Dr. Gudina on 04/21/2024, no further platelet transfusions today, Nplate  started on 04/21/2024 and we will be getting second dose on 04/26/2024, hematology following, 1 unit of platelet transfusion due to persistent severe thrombocytopenia on 04/22/2024, discussed with hematology on 04/21/2024,04/22/2024, 04/26/2024.  Normocytic anemia Likely secondary to critical illness Continue to trend/follow CBC, she has thus far received 1 unit of packed RBC on 04/21/2024, second unit on 04/28/2024.  No ongoing bleed.  History of SVT night of 04/22/2024, seen by cardiology, resolved after adenosine .  Placed on low-dose beta-blocker, currently stable, stable TSH continue to monitor.  Acute metabolic encephalopathy Secondary to hypothermia/hypoglycemia/undifferentiated shock/acute liver failure Head CT without acute abnormalities Seems to have clinically improved after treatment of underlying etiologies.  Hypothermia Likely secondary to environmental exposure (per prior notes- cool room with single blanket-no heating or light) TSH stable Now normothermic after supportive care  Acute liver failure Unclear etiology-possibly shock liver from hypotension from possible sepsis physiology LFTs/coags improving Dopplers without portal/hepatic vein thrombosis Acute hepatitis serology negative CMV IgM positive but viral load negative  Negative workup for autoimmune hepatitis GI following-completed N-acetylcysteine  12/10 Empirically on lactulose   Oropharyngeal dysphagia Secondary to critical illness/encephalopathy SLP following-on dysphagia 2 diet.  AKI Hemodynamically mediated due to  hypotension Resolved.  Hypokalemia/Hypophosphatemia Repleted  Metabolic acidosis Likely due to delayed clearance of lactic acid-in the setting of liver failure. Improved with supportive care  Self-limited episode-small-volume coffee-ground emesis Hb stable PPI GI following-no endoscopic procedures planned at this point  Chest pain Likely atypical Troponins minimally elevated-echo with stable EF Supportive care for now  Minimally elevated troponins Trend is flat Likely demand ischemia in the setting of hypotension/critical illness. Echo with able EF  HTN BP stable, low-dose beta-blocker and Norvasc .  History of chronic HFpEF Status stable  History of CVA Baseline left hemiparesis Antiplatelets/statin held-in the setting of elevated liver enzymes and thrombocytopenia.  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  Oral intake somewhat poor Remeron  added on 04/28/2024.    Patient also often refusing to eat, hitting the staff, psych also requested to evaluate.  Lab Results  Component Value Date   HGBA1C 5.3 09/11/2020   Lab Results  Component Value Date   TSH 3.370 04/23/2024    CBG (last 3)  Recent Labs    04/28/24 2355 04/29/24 0024 04/29/24 0416  GLUCAP 62* 104* 105*     Pressure Ulcer: Agree with assessment and plan as outlined below Wound 04/14/24 0100 Pressure Injury Hip Left  Stage 2 -  Partial thickness loss of dermis presenting as a shallow open injury with a red, pink wound bed without slough. (Active)     Wound 04/14/24 0100 Pressure Injury Buttocks Left;Right Unstageable - Full thickness tissue loss in which the base of the injury  is covered by slough (yellow, tan, gray, green or brown) and/or eschar (tan, brown or black) in the wound bed. (Active)     Wound 04/14/24 0100 Pressure Injury Sacrum Medial Stage 2 -  Partial thickness loss of dermis presenting as a shallow open injury with a red, pink wound bed without slough. (Active)    Code status:   Code Status: Full Code   DVT Prophylaxis: SCDs Start: 04/13/24 2344 Avoid pharmacological prophylaxis-given severity of thrombocytopenia   Family Communication: Daughter-[phone] left VM 12/10,12/12.12/13   Disposition Plan: Status is: Inpatient Remains inpatient appropriate because: Severity of illness   Planned Discharge Destination:Skilled nursing facility   Diet: Diet Order             DIET DYS 3 Room service appropriate? Yes with Assist; Fluid consistency: Thin  Diet effective now                     Data Review:   Patient Lines/Drains/Airways Status     Active Line/Drains/Airways     Name Placement date Placement time Site Days   Peripheral IV 04/22/24 20 G 2.5 Anterior;Right;Upper Arm 04/22/24  2239  Arm  7   External Urinary Catheter 04/29/24  0553  --  less than 1   Wound 04/14/24 0100 Pressure Injury Hip Left Stage 2 -  Partial thickness loss of dermis presenting as a shallow open injury with a red, pink wound bed without slough. 04/14/24  0100  Hip  15   Wound 04/14/24 0100 Pressure Injury Buttocks Left;Right Unstageable - Full thickness tissue loss in which the base of the injury is covered by slough (yellow, tan, gray, green or brown) and/or eschar (tan, brown or black) in the wound bed. 04/14/24  0100  Buttocks  15   Wound 04/14/24 0100 Pressure Injury Sacrum Medial  Stage 2 -  Partial thickness loss of dermis presenting as a shallow open injury with a red, pink wound bed without slough. 04/14/24  0100  Sacrum  15             Inpatient Medications  Scheduled Meds:  amLODipine   10 mg Oral Daily   feeding supplement  237 mL Oral BID BM   Gerhardt's butt cream   Topical BID   lactulose   20 g Oral TID   metoprolol  tartrate  12.5 mg Oral BID   mirtazapine   15 mg Oral QHS   multivitamin with minerals  1 tablet Oral Daily   mouth rinse  15 mL Mouth Rinse 4 times per day   pantoprazole   40 mg Oral BID   romiPLOStim   3 mcg/kg Subcutaneous Weekly   Continuous Infusions:  cefTRIAXone  (ROCEPHIN )  IV 2 g (04/28/24 1341)   DAPTOmycin  700 mg (04/28/24 1426)   PRN Meds:.dextrose , docusate, hydrALAZINE , HYDROcodone -acetaminophen , ipratropium-albuterol , melatonin, metoprolol  tartrate, morphine  injection, ondansetron  (ZOFRAN ) IV, mouth rinse, polyethylene glycol  DVT Prophylaxis  SCDs Start: 04/13/24 2344   Recent Labs  Lab 04/25/24 0346 04/26/24 0711 04/27/24 0456 04/28/24 0413 04/28/24 1707 04/29/24 0704  WBC 3.1* 3.1* 2.6* 2.8*  --  2.9*  HGB 8.1* 8.0* 7.9* 7.7* 10.1* 10.1*  HCT 24.6* 24.3* 23.6* 23.0* 30.2* 30.9*  PLT 38* 33* 42* 46*  --  52*  MCV 93.9 94.9 93.3 94.3  --  94.2  MCH 30.9 31.3 31.2 31.6  --  30.8  MCHC 32.9 32.9 33.5 33.5  --  32.7  RDW 18.1* 18.7* 18.9* 19.4*  --  20.9*  LYMPHSABS 1.4 1.3 1.2  1.5  --  1.7  MONOABS 0.3 0.2 0.3 0.2  --  0.2  EOSABS 0.2 0.2 0.2 0.2  --  0.2  BASOSABS 0.0 0.0 0.0 0.0  --  0.0    Recent Labs  Lab 04/22/24 2342 04/23/24 0351 04/24/24 0328 04/25/24 0346 04/26/24 0014 04/26/24 0432 04/26/24 0711 04/27/24 0456 04/28/24 0413 04/29/24 0704  NA 135 134* 133* 132*  --   --  131* 135 136 135  K 3.5 3.8 3.9 4.0  --   --  3.8 3.7 3.4* 4.6  CL 105 105 106 105  --   --  106 108 107 107  CO2 24 24 21* 22  --   --  18* 21* 21* 19*  ANIONGAP 6 5 7 5   --   --  8 6 7 9   GLUCOSE 93 97 70 65*  --    --  75 74 89 55*  BUN 10 11 10 10   --   --  7* <5* 7* 7*  CREATININE 0.57 0.57 0.52 0.49  --   --  0.50 0.45 0.50 0.49  AST 68* 71* 64* 57*  --   --   --   --   --   --   ALT 103* 105* 100* 81*  --   --   --   --   --   --   ALKPHOS 105 110 114 108  --   --   --   --   --   --   BILITOT 1.1 1.4* 1.0 0.7  --   --   --   --   --   --   ALBUMIN  1.9* 2.1* 2.2* 2.3*  --   --   --   --   --   --   TSH  --  3.370  --   --   --   --   --   --   --   --   BNP 227.1*  --   --   --   --   --   --   --   --   --   MG 1.7 1.8 1.9  --  1.6* 2.1  --  1.8 1.8 2.0  PHOS  --  2.4* 2.5  --   --   --   --  2.9 2.8 2.7  CALCIUM  7.4* 7.6* 8.0* 8.0*  --   --  7.8* 7.9* 7.9* 8.2*      Recent Labs  Lab 04/22/24 2342 04/23/24 0351 04/24/24 0328 04/25/24 0346 04/26/24 0014 04/26/24 0432 04/26/24 0711 04/27/24 0456 04/28/24 0413 04/29/24 0704  TSH  --  3.370  --   --   --   --   --   --   --   --   BNP 227.1*  --   --   --   --   --   --   --   --   --   MG 1.7 1.8   < >  --  1.6* 2.1  --  1.8 1.8 2.0  CALCIUM  7.4* 7.6*   < > 8.0*  --   --  7.8* 7.9* 7.9* 8.2*   < > = values in this interval not displayed.    --------------------------------------------------------------------------------------------------------------- Lab Results  Component Value Date   CHOL 288 (H) 05/30/2023   HDL 78 05/30/2023   LDLCALC 165 (H) 05/30/2023   TRIG 248 (H) 05/30/2023   CHOLHDL 3.7  05/30/2023    Lab Results  Component Value Date   HGBA1C 5.3 09/11/2020   No results for input(s): TSH, T4TOTAL, FREET4, T3FREE, THYROIDAB in the last 72 hours.  No results for input(s): VITAMINB12, FOLATE, FERRITIN, TIBC, IRON, RETICCTPCT in the last 72 hours. ------------------------------------------------------------------------------------------------------------------ Cardiac Enzymes No results for input(s): CKMB, TROPONINI, MYOGLOBIN in the last 168 hours.  Invalid input(s): CK  Micro  Results No results found for this or any previous visit (from the past 240 hours).   Radiology Reports  No results found.  Signature  -   Lavada Stank M.D on 04/29/2024 at 10:41 AM   -  To page go to [email or web address]  "

## 2024-04-29 NOTE — TOC Progression Note (Addendum)
 Transition of Care [facility]) - Progression Note    Patient Details  Name: [name] MRN: [id number] Date of Birth: Jul 01, 1945  [facility]) CM/SW Contact  [name], LCSW Phone Number: 04/29/2024, 9:14 AM  Clinical Narrative:    CSW continuing to follow.  Received request to contact daughter, Shona. CSW contacted her but voicemail full.   Expected Discharge Plan: Skilled Nursing Facility Barriers to Discharge: Continued Medical Work up               Expected Discharge Plan and Services In-house Referral: Clinical Social Work   Post Acute Care Choice: Skilled Nursing Facility Living arrangements for the past 2 months: Single Family Home                                       Social Drivers of Health (SDOH) Interventions SDOH Screenings   Food Insecurity: Food Insecurity Present (04/15/2024)  Housing: High Risk (04/22/2024)  Transportation Needs: Unmet Transportation Needs (04/15/2024)  Utilities: At Risk (04/15/2024)  Depression (PHQ2-9): Low Risk (06/04/2021)  Social Connections: Unknown (04/15/2024)  Tobacco Use: Medium Risk (03/22/2024)    Readmission Risk Interventions    01/15/2024    2:24 PM  Readmission Risk Prevention Plan  Transportation Screening Complete  HRI or Home Care Consult Complete  Social Work Consult for Recovery Care Planning/Counseling Complete  Palliative Care Screening Not Applicable  Medication Review Oceanographer) Complete

## 2024-04-29 NOTE — Progress Notes (Signed)
 Speech Language Pathology Treatment: Dysphagia  Patient Details Name: [name] MRN: [id number] DOB: 03-04-1946 Today's Date: 04/29/2024 Time: 9144-9091 SLP Time Calculation (min) (ACUTE ONLY): 13 min  Assessment / Plan / Recommendation Clinical Impression  Pt seen for follow up SLP session to assess diet tolerance and reinforce compensatory swallow strategies. Pt only accepted magic cup today with crushed meds. She declined all other solids and liquids on her meal tray despite encouragement. No overt or subtle s/s of aspiration observed with PO intake. Pt continues to exhibit brief oral holding with ~25% of trials, though initiates oral transit with min verbal cues. Per chart, pt is consuming a range of 0-50% of meals.   Anticipate mechanical soft consistency and thin liquids is pt's least restrictive diet at this time. No further SLP intervention is needed in hospital. Recommend SLP follow up at next level of care to determine if further diet advancement is indicated. SLP will sign off. Please re-consult if pt exhibits concerns for aspiration with PO intake.    HPI HPI: [name] is a 78 y/o female presenting 04/14/24 after roommate called EMS as the patient was unresponsive. She reports chest pain and was very confused. CT no acute intracranial abnormality.  2. Stable right parietal cortical encephalomalacia     PMHx:  HTN, HLD, CVA left hemiparesis, CAPD, DVT, IVH. UA positive. SLP consulted for clinical swallow assessment. Most recent MBSS completed on 04/18/24 with recommendations of a D2 diet and thin liquids. Pt further advanced to D3 diet during follow up sessions with continued thin liquids.      SLP Plan  All goals met;Discharge SLP treatment due to (comment)         Recommendations  Diet recommendations: Dysphagia 3 (mechanical soft);Thin liquid Liquids provided via: Straw;Cup Medication Administration: Whole meds with puree Supervision: Full supervision/cueing for  compensatory strategies;Staff to assist with self feeding Compensations: Slow rate;Small sips/bites;Follow solids with liquid Postural Changes and/or Swallow Maneuvers: Seated upright 90 degrees;Upright 30-60 min after meal                  Oral care BID   Frequent or constant Supervision/Assistance Dysphagia, oropharyngeal phase (R13.12)     All goals met;Discharge SLP treatment due to (comment)     [name]  04/29/2024, 10:11 AM

## 2024-04-29 NOTE — Plan of Care (Signed)
" °  Problem: SLP Dysphagia Goals Goal: Misc Dysphagia Goal Flowsheets (Taken 04/15/2024 1517) Misc Dysphagia Goal: Pt will tolerate least restrictive diet with no overt or subtle s/s of aspiration or decline in pulmonary status.   "

## 2024-04-29 NOTE — Progress Notes (Signed)
 PT Cancellation Note  Patient Details Name: [name] MRN: [id number] DOB: 1946/04/13   Cancelled Treatment:    Reason Eval/Treat Not Completed: (P) Patient declined, no reason specified (Pt declines PT session despite stating no pain or fatigue. Education provided on importance of mobility. Will continue to follow.)   Darryle George 04/29/2024, 9:50 AM

## 2024-04-29 NOTE — Consult Note (Signed)
 [facility] Psychiatric Consult Initial  Patient Name: .[name]  MRN: [id number]  DOB: [birth year]  Consult Order details:  Orders (From admission, onward)     Start     Ordered   04/29/24 0849  IP CONSULT TO PSYCHIATRY       Ordering Provider: Dennise Lavada POUR, MD  Provider:  (Not yet assigned)  Question Answer Comment  Location [facility]   Reason for Consult? Refusal to eat, hitting staff, trying to bite the tech, question capacity to make decisions, no family.      04/29/24 0848            Mode of Visit: In person   Psychiatry Consult Evaluation  Service Date: April 29, 2024 LOS:  LOS: 16 days  Chief Complaint: Refusal to eat, hitting staff, trying to bite the tech, question capacity to make decisions, no family.  Primary Psychiatric Diagnoses  Delirium secondary to medication condition - with behavioral disturbance  Assessment  [name] is a 78 y.o. female admitted medically on 04/13/2024  8:19 PM after being found by her roommate unresponsive (no heat/light-under a single blanket)- she was apparently covered in feces/dried blood-found to be hypothermic/ hypotensive/ hypoglycemic- she was thought to have undifferentiated shock-acute liver failure-acute kidney injury. She has no significant past psychiatric history and has a past medical history of CVA, COPD, HTN.   Her current presentation of fluctuating mental status, intermittent agitation and combativeness in the setting of recent infection and current hospitalization is most consistent with delirium secondary to medical condition. She does not currently take any outpatient psychotropic medications. Primary team has started her on mirtazapine  for appetite stimulation.   On initial examination, patient is unresponsive to questioning. She stares blankly during the assessment. She does track me around the room with her eyes when prompted. She is not oriented to person, place, time, or situation.  Despite multiple prompts, patient is unable to state her first name. Asked the patient to wiggle her fingers, show a thumbs-up, but she does not follow any commands. Asked patient to identify common objects including a pen and cup. She was unable to identify the objects by name and was unable to demonstrate how these items are used. Overall, patient appears profoundly confused, most likely secondary to delirium. She does NOT demonstrate the ability to understand, comprehend, rationalize or communicate decisions per Applebaum criteria. At the time, patient LACKS decision-making capacity.  Please see plan below for detailed recommendations.   Diagnoses:  Active Hospital problems: Principal Problem:   Acute liver failure Active Problems:   Transaminitis   Aortic valve vegetation   Protein-calorie malnutrition, severe   Thrombocytopenia   Vegetation of heart valve   Pressure injury   SVT (supraventricular tachycardia)    Plan   ## Psychiatric Medication Recommendations:  -- Start Seroquel  25 mg PRN for agitation -- Continue mirtazapine  15 mg at bedtime for appetite stimulation  ## Medical Decision Making Capacity: At this time, patient LACKS decision-making capacity. She appears confused, delirious, unresponsive to questions, is not oriented to person, place, time, or situation. She does not follow commands, cannot recognize common objects or identify their purpose. She does NOT demonstrate the ability to understand, comprehend, rationalize or communicate decisions.    ## Further Work-up:  -- Per primary team -- While pt on Qtc prolonging medications, please monitor & replete K+ to 4 and Mg2+ to 2 -- most recent EKG on 04/25/24 had QtC of 406 -- Pertinent labwork reviewed earlier this  admission includes: BMP, Mg, CBC, TSH, UA  ## Disposition:-- There are no psychiatric contraindications to discharge at this time  ## Behavioral / Environmental: -Delirium Precautions: Delirium  Interventions for Nursing and Staff: - RN to open blinds every AM. - To Bedside: Glasses, hearing aide, and pt's own shoes. Make available to patients. when possible and encourage use. - Encourage po fluids when appropriate, keep fluids within reach. - OOB to chair with meals. - Passive ROM exercises to all extremities with AM & PM care. - RN to assess orientation to person, time and place QAM and PRN. - Recommend extended visitation hours with familiar family/friends as feasible. - Staff to minimize disturbances at night. Turn off television when pt asleep or when not in use.    ## Safety and Observation Level:  - Based on my clinical evaluation, I estimate the patient to be at low risk of self harm in the current setting. - At this time, we recommend  routine observation. This decision is based on my review of the chart including patient's history and current presentation, interview of the patient, mental status examination, and consideration of suicide risk including evaluating suicidal ideation, plan, intent, suicidal or self-harm behaviors, risk factors, and protective factors. This judgment is based on our ability to directly address suicide risk, implement suicide prevention strategies, and develop a safety plan while the patient is in the clinical setting. Please contact our team if there is a concern that risk level has changed.  CSSR Risk Category:C-SSRS RISK CATEGORY: No Risk  Suicide Risk Assessment: Patient has following modifiable risk factors for suicide: None Patient has following non-modifiable or demographic risk factors for suicide: None Patient has the following protective factors against suicide: Supportive family, no history of suicide attempts, and no history of NSSIB  Thank you for this consult request. Recommendations have been communicated to the primary team.  We will follow at this time.   Ashley LOISE Gravely, MD       History of Present Illness  Relevant Aspects of  Hospital Course:  Admitted on 04/13/2024 after being found by her roommate unresponsive (no heat/light-under a single blanket)-she was apparently covered in feces/dried blood-found to be hypothermic/hypotensive/hypoglycemic-she was thought to have undifferentiated shock-acute liver failure-acute kidney injury-admitted to the ICU-stabilized with pressors/D10 infusion/broad-spectrum antibiotics-rapidly improved and subsequently transferred to [facility]. 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   Patient Report:  Unable to complete as patient in non-responsive to questions. She stares blankly at me and does not engage on interview. She is disoriented to person, place, time, and situation. She does not follow commands. She is not able to identify objects or state their purpose. Spoke with patient's nurse, who reports patient typically gets agitated and combative towards staff when someone is physically touching her in order to perform patient care or trying to feed her or give her meds. Otherwise, patient has been staring blankly.  Psych ROS:  Depression: Unable to assess (UTA) Anxiety:  UTA Mania (lifetime and current): UTA Psychosis: (lifetime and current): UTA   Review of Systems  Reason unable to perform ROS: altered mental status.    Psychiatric and Social History  Psychiatric History:  Information collected from chart review (patient seen on psych consult service in 01/2024).  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 rate 14, height 5' 10 (1.778 m), weight 81.1 kg, SpO2 100%. Body mass index is 25.65 kg/m.  Physical Exam Vitals and nursing note reviewed.  Constitutional:      General: She is not in acute distress.    Appearance: Normal appearance. She is normal weight. She is not ill-appearing.  HENT:     Head: Normocephalic and atraumatic.  Pulmonary:     Effort: Pulmonary effort is normal. No respiratory distress.  Neurological:     Mental Status: She is alert. She is disoriented.    Mental Status Exam: General Appearance: Casual  Orientation:  Other:  disoriented to person, place, time, situation  Memory:  NA  Concentration:  Concentration: Poor and Attention Span: Poor  Recall:  Poor  Attention  Poor  Eye Contact:  stares blankly  Speech:  does not respond to questions  Language:  NA  Volume:  UTA  Mood: UTA  Affect:  UTA  Thought Process:  NA  Thought Content:  NA  Suicidal Thoughts:   UTA  Homicidal Thoughts:   UTA  Judgement:  Impaired  Insight:   UTA  Psychomotor Activity:  Decreased, but gets intermittently agitated  Akathisia:  No  Fund of Knowledge:   UTA    Assets:  Social Support  Cognition:  Impaired  ADL's:  Impaired  AIMS (if indicated):     Other History   These have been pulled in through the EMR, reviewed, and updated if appropriate.   Family History:  The patient's family history includes Hypertension in her mother.  Medical History: Past Medical History:  Diagnosis Date   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 (HCC) 09/16/2020   COPD (chronic obstructive pulmonary disease) (HCC)    History of DVT (deep vein thrombosis)    Hypertension    Thoracoabdominal aortic aneurysm    TIA (transient ischemic attack) 09/10/2020    Surgical History: Past Surgical History:  Procedure Laterality Date   IR BONE MARROW BIOPSY & ASPIRATION  12/13/2023   IR CT HEAD LTD  09/11/2020   IR INTRAVSC STENT CERV CAROTID W/O EMB-PROT MOD SED INC ANGIO  09/11/2020   IR PERCUTANEOUS ART THROMBECTOMY/INFUSION INTRACRANIAL INC DIAG ANGIO  09/11/2020       IR PERCUTANEOUS ART THROMBECTOMY/INFUSION INTRACRANIAL INC DIAG ANGIO  09/11/2020   IR US  GUIDE VASC ACCESS RIGHT  09/11/2020   NO PAST SURGERIES     RADIOLOGY WITH ANESTHESIA N/A 09/11/2020   Procedure: IR WITH ANESTHESIA;  Surgeon: Dolphus Carrion, MD;  Location: [facility] OR;  Service: Radiology;  Laterality: N/A;   Medications:  Current Medications[1]  Allergies: Allergies[2]  Ashley LOISE Gravely, MD    [1]  Current Facility-Administered Medications:    amLODipine  (NORVASC ) tablet 10 mg, 10 mg, Oral, Daily, Singh, Prashant K, MD, 10 mg at 04/29/24 9157   cefTRIAXone  (ROCEPHIN ) 2 g in sodium chloride  0.9 % 100 mL IVPB, 2 g, Intravenous, Q1400, Dea Shiner, MD, Last Rate: 200 mL/hr at 04/29/24 1257, 2 g at 04/29/24 1257   DAPTOmycin  (CUBICIN ) IVPB 700 mg/100mL premix, 700 mg, Intravenous, Q1400, Manandhar, Sabina, MD, Last Rate: 200 mL/hr at 04/28/24 1426, 700 mg at 04/28/24 1426   dextrose  50 % solution 50 mL, 1 ampule, Intravenous, Q1H PRN, Howerter, Justin B, DO, 50 mL at 04/28/24 2359   docusate (COLACE) 50 MG/5ML liquid 100 mg, 100 mg, Oral, BID PRN, Alva, Rakesh V, MD   feeding supplement (ENSURE PLUS HIGH PROTEIN) liquid 237 mL, 237 mL,  Oral, BID BM, Ghimire, Donalda HERO, MD, 237 mL at 04/29/24 9157   Gerhardt's butt cream, , Topical, BID, Ghimire, Donalda HERO, MD, Given at 04/29/24 [phone]   hydrALAZINE  (APRESOLINE ) injection 10 mg, 10 mg, Intravenous, Q6H PRN, Singh, Prashant K, MD   HYDROcodone -acetaminophen  (NORCO/VICODIN) 5-325 MG per tablet 1 tablet, 1 tablet, Oral, Q8H PRN, Dennise Lavada POUR, MD, 1 tablet at 04/29/24 1254   ipratropium-albuterol  (DUONEB) 0.5-2.5 (3) MG/3ML nebulizer solution 3 mL, 3 mL, Nebulization, Q6H PRN, Tobie Gaines, DO   lactulose  (CHRONULAC ) 10 GM/15ML solution 20 g, 20 g, Oral, TID, Alva, Rakesh V, MD, 20 g at 04/28/24 2127   melatonin tablet 3 mg, 3 mg, Oral, QHS PRN, Howerter, Justin B, DO, 3 mg at 04/29/24 0138   metoprolol  tartrate (LOPRESSOR ) injection 2.5 mg, 2.5 mg, Intravenous, Q4H PRN, Howerter, Justin B, DO, 2.5 mg at 04/27/24 2152   metoprolol  tartrate (LOPRESSOR ) tablet 12.5 mg, 12.5 mg, Oral, BID, Singh, Prashant K, MD, 12.5 mg at 04/29/24 9158   mirtazapine  (REMERON  SOL-TAB) disintegrating tablet 15 mg, 15 mg, Oral, QHS, Singh, Prashant K, MD, 15 mg at 04/28/24 2127   morphine  (PF) 2 MG/ML injection 1 mg, 1 mg, Intravenous, Q8H PRN, Singh, Prashant K, MD, 1 mg at 04/28/24 2125   multivitamin with minerals tablet 1 tablet, 1 tablet, Oral, Daily, Ghimire, Donalda HERO, MD, 1 tablet at 04/29/24 9157   ondansetron  (ZOFRAN ) injection 4 mg, 4 mg, Intravenous, Q6H PRN, Paliwal, Aditya, MD   Oral care mouth rinse, 15 mL, Mouth Rinse, 4 times per day, Jude Harden GAILS, MD, 15 mL at 04/28/24 2128   Oral care mouth rinse, 15 mL, Mouth Rinse, PRN, Jude Harden GAILS, MD   pantoprazole  (PROTONIX ) EC tablet 40 mg, 40 mg, Oral, BID, Pham, Minh Q, RPH-CPP, 40 mg at 04/29/24 9157   polyethylene glycol (MIRALAX  / GLYCOLAX ) packet 17 g, 17 g, Oral, Daily PRN, Alva, Rakesh V, MD   QUEtiapine  (SEROQUEL ) tablet 25 mg, 25 mg, Oral, Daily PRN, Mannie Ashley SAILOR, MD   romiPLOStim  (NPLATE ) injection 245 mcg, 3 mcg/kg,  Subcutaneous, Weekly, Rouson, Olam PARAS, NP, 245 mcg at 04/26/24 1322 [2]  Allergies Allergen Reactions   Iodinated Contrast Media Itching and Other (See Comments)    9/1 developed diffuse itching after contrast dye for CT (without rash or airway involvement or GI symptoms) . Consider pretreatment before IV contrast in the future

## 2024-04-30 ENCOUNTER — Other Ambulatory Visit: Payer: Self-pay

## 2024-04-30 ENCOUNTER — Encounter (HOSPITAL_COMMUNITY): Admission: EM | Payer: Self-pay | Source: Home / Self Care | Attending: Internal Medicine

## 2024-04-30 DIAGNOSIS — L89329 Pressure ulcer of left buttock, unspecified stage: Secondary | ICD-10-CM | POA: Diagnosis not present

## 2024-04-30 DIAGNOSIS — Z7189 Other specified counseling: Secondary | ICD-10-CM | POA: Diagnosis not present

## 2024-04-30 DIAGNOSIS — F05 Delirium due to known physiological condition: Secondary | ICD-10-CM

## 2024-04-30 DIAGNOSIS — L89319 Pressure ulcer of right buttock, unspecified stage: Secondary | ICD-10-CM | POA: Diagnosis not present

## 2024-04-30 DIAGNOSIS — Z515 Encounter for palliative care: Secondary | ICD-10-CM | POA: Diagnosis not present

## 2024-04-30 DIAGNOSIS — Z711 Person with feared health complaint in whom no diagnosis is made: Secondary | ICD-10-CM | POA: Diagnosis not present

## 2024-04-30 DIAGNOSIS — R410 Disorientation, unspecified: Principal | ICD-10-CM

## 2024-04-30 DIAGNOSIS — K7200 Acute and subacute hepatic failure without coma: Secondary | ICD-10-CM | POA: Diagnosis not present

## 2024-04-30 DIAGNOSIS — G9340 Encephalopathy, unspecified: Secondary | ICD-10-CM

## 2024-04-30 DIAGNOSIS — D696 Thrombocytopenia, unspecified: Secondary | ICD-10-CM | POA: Diagnosis not present

## 2024-04-30 DIAGNOSIS — I471 Supraventricular tachycardia, unspecified: Secondary | ICD-10-CM | POA: Diagnosis not present

## 2024-04-30 DIAGNOSIS — R7401 Elevation of levels of liver transaminase levels: Secondary | ICD-10-CM | POA: Diagnosis not present

## 2024-04-30 LAB — CBC WITH DIFFERENTIAL/PLATELET
Abs Immature Granulocytes: 0.01 K/uL (ref 0.00–0.07)
Basophils Absolute: 0 K/uL (ref 0.0–0.1)
Basophils Relative: 1 %
Eosinophils Absolute: 0.2 K/uL (ref 0.0–0.5)
Eosinophils Relative: 10 %
HCT: 30.6 % — ABNORMAL LOW (ref 36.0–46.0)
Hemoglobin: 10.1 g/dL — ABNORMAL LOW (ref 12.0–15.0)
Immature Granulocytes: 1 %
Lymphocytes Relative: 51 %
Lymphs Abs: 1.1 K/uL (ref 0.7–4.0)
MCH: 31 pg (ref 26.0–34.0)
MCHC: 33 g/dL (ref 30.0–36.0)
MCV: 93.9 fL (ref 80.0–100.0)
Monocytes Absolute: 0.2 K/uL (ref 0.1–1.0)
Monocytes Relative: 10 %
Neutro Abs: 0.6 K/uL — ABNORMAL LOW (ref 1.7–7.7)
Neutrophils Relative %: 27 %
Platelets: 62 K/uL — ABNORMAL LOW (ref 150–400)
RBC: 3.26 MIL/uL — ABNORMAL LOW (ref 3.87–5.11)
RDW: 20.6 % — ABNORMAL HIGH (ref 11.5–15.5)
Smear Review: NORMAL
WBC: 2.1 K/uL — ABNORMAL LOW (ref 4.0–10.5)
nRBC: 0 % (ref 0.0–0.2)

## 2024-04-30 LAB — GLUCOSE, CAPILLARY
Glucose-Capillary: 67 mg/dL — ABNORMAL LOW (ref 70–99)
Glucose-Capillary: 55 mg/dL — ABNORMAL LOW (ref 70–99)
Glucose-Capillary: 115 mg/dL — ABNORMAL HIGH (ref 70–99)
Glucose-Capillary: 74 mg/dL (ref 70–99)
Glucose-Capillary: 76 mg/dL (ref 70–99)
Glucose-Capillary: 102 mg/dL — ABNORMAL HIGH (ref 70–99)

## 2024-04-30 LAB — PATHOLOGIST SMEAR REVIEW

## 2024-04-30 SURGERY — TRANSESOPHAGEAL ECHOCARDIOGRAM (TEE) (CATHLAB)
Anesthesia: Monitor Anesthesia Care

## 2024-04-30 MED ORDER — QUETIAPINE FUMARATE 25 MG PO TABS
25.0000 mg | ORAL_TABLET | Freq: Two times a day (BID) | ORAL | Status: AC
  Administered 2024-04-30 – 2024-06-14 (×83): 25 mg via ORAL
  Filled 2024-04-30 (×74): qty 1

## 2024-04-30 MED ORDER — QUETIAPINE FUMARATE 25 MG PO TABS
12.5000 mg | ORAL_TABLET | Freq: Every day | ORAL | Status: AC | PRN
  Administered 2024-05-04 – 2024-05-24 (×2): 12.5 mg via ORAL
  Filled 2024-04-30 (×2): qty 1

## 2024-04-30 MED ORDER — DEXTROSE 50 % IV SOLN: 12.5000 g | INTRAVENOUS | Status: AC

## 2024-04-30 MED ORDER — HALOPERIDOL LACTATE 5 MG/ML IJ SOLN
5.0000 mg | Freq: Once | INTRAMUSCULAR | Status: AC
  Administered 2024-04-30: 5 mg via INTRAMUSCULAR
  Filled 2024-04-30: qty 1

## 2024-04-30 NOTE — Consult Note (Addendum)
 [facility]  Patient Name: .[name]  MRN: [id number]  DOB: Dec 21, 1945  Consult Order details:  Orders (From admission, onward)     Start     Ordered   04/29/24 0849  IP CONSULT TO PSYCHIATRY       Ordering Provider: Dennise Lavada POUR, MD  Provider:  (Not yet assigned)  Question Answer Comment  Location [facility]   Reason for Consult? Refusal to eat, hitting staff, trying to bite the tech, question capacity to make decisions, no family.      04/29/24 0848            Mode of Visit: In person   Psychiatry Consult Evaluation  Service Date: April 30, 2024 LOS:  LOS: 17 days  Chief Complaint: Refusal to eat, hitting staff, trying to bite the tech, question capacity to make decisions, no family.  Primary Psychiatric Diagnoses  Delirium secondary to medication condition - with behavioral disturbance  Assessment  [name] is a 78 y.o. female admitted medically on 04/13/2024  8:19 PM after being found by her roommate unresponsive (no heat/light-under a single blanket)- she was apparently covered in feces/dried blood-found to be hypothermic/ hypotensive/ hypoglycemic- she was thought to have undifferentiated shock-acute liver failure-acute kidney injury. She has no significant past psychiatric history and has a past medical history of CVA, COPD, HTN.   Her current presentation of fluctuating mental status, intermittent agitation and combativeness in the setting of recent infection and current hospitalization is most consistent with delirium secondary to medical condition. She does not currently take any outpatient psychotropic medications. Primary team has started her on mirtazapine  for appetite stimulation.   On initial examination, patient is unresponsive to questioning. She stares blankly during the assessment. She does track me around the room with her eyes when prompted. She is not oriented to person, place, time, or situation.  Despite multiple prompts, patient is unable to state her first name. Asked the patient to wiggle her fingers, show a thumbs-up, but she does not follow any commands. Asked patient to identify common objects including a pen and cup. She was unable to identify the objects by name and was unable to demonstrate how these items are used. Overall, patient appears profoundly confused, most likely secondary to delirium. She does NOT demonstrate the ability to understand, comprehend, rationalize or communicate decisions per Applebaum criteria. At the time, patient LACKS decision-making capacity.  04/30/24: Patient remains delirious, but appears more lucid this morning in accordance with the waxing-and-waning nature of delirium. She is able to speak briefly with me. She is oriented to person, place, time. She states she typically has an appetite, but its decreased since coming to the hospital as she doesn't like the food. States staff put a mitt on her because they think she is crazy. Per nursing, patient still combative with staff, refusing meds and food. Will add on low-dose Seroquel  PRN for agitation in addition to scheduled doses.  Please see plan below for detailed recommendations.   Diagnoses:  Active Hospital problems: Principal Problem:   Acute liver failure Active Problems:   Transaminitis   Aortic valve vegetation   Protein-calorie malnutrition, severe   Thrombocytopenia   Vegetation of heart valve   Pressure injury   SVT (supraventricular tachycardia)    Plan   ## Psychiatric Medication Recommendations:  -- Continue Seroquel  25 mg BID for agitation -- Continue Mirtazapine  15 mg at bedtime for appetite stimulation -- Start Seroquel  12.5 mg PRN for agitation  ##  Medical Decision Making Capacity: At this time, patient LACKS decision-making capacity. She appears delirious and fluctuates between being unresponsive to questions, disoriented to person, place, time, or situation. She was not  able to follow commands,  recognize common objects or identify their purpose. She does NOT demonstrate the ability to understand, comprehend, rationalize or communicate decisions.    ## Further Work-up:  -- Per primary team -- While pt on Qtc prolonging medications, please monitor & replete K+ to 4 and Mg2+ to 2 -- most recent EKG on 04/25/24 had QtC of 406 -- Pertinent labwork reviewed earlier this admission includes: BMP, Mg, CBC, TSH, UA  ## Disposition:-- There are no psychiatric contraindications to discharge at this time  ## Behavioral / Environmental: -Delirium Precautions: Delirium Interventions for Nursing and Staff: - RN to open blinds every AM. - To Bedside: Glasses, hearing aide, and pt's own shoes. Make available to patients. when possible and encourage use. - Encourage po fluids when appropriate, keep fluids within reach. - OOB to chair with meals. - Passive ROM exercises to all extremities with AM & PM care. - RN to assess orientation to person, time and place QAM and PRN. - Recommend extended visitation hours with familiar family/friends as feasible. - Staff to minimize disturbances at night. Turn off television when pt asleep or when not in use.    ## Safety and Observation Level:  - Based on my clinical evaluation, I estimate the patient to be at low risk of self harm in the current setting. - At this time, we recommend  routine observation. This decision is based on my review of the chart including patient's history and current presentation, interview of the patient, mental status examination, and consideration of suicide risk including evaluating suicidal ideation, plan, intent, suicidal or self-harm behaviors, risk factors, and protective factors. This judgment is based on our ability to directly address suicide risk, implement suicide prevention strategies, and develop a safety plan while the patient is in the clinical setting. Please contact our team if there is a concern that  risk level has changed.  CSSR Risk Category:C-SSRS RISK CATEGORY: No Risk  Suicide Risk Assessment: Patient has following modifiable risk factors for suicide: None Patient has following non-modifiable or demographic risk factors for suicide: None Patient has the following protective factors against suicide: Supportive family, no history of suicide attempts, and no history of NSSIB  Thank you for this consult request. Recommendations have been communicated to the primary team. We will follow at this time.   Ashley LOISE Gravely, MD      History of Present Illness  Relevant Aspects of Hospital Course:  Admitted on 04/13/2024 after being found by her roommate unresponsive (no heat/light-under a single blanket)-she was apparently covered in feces/dried blood-found to be hypothermic/hypotensive/hypoglycemic-she was thought to have undifferentiated shock-acute liver failure-acute kidney injury-admitted to the ICU-stabilized with pressors/D10 infusion/broad-spectrum antibiotics-rapidly improved and subsequently transferred to [facility]. 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   Initial Patient Report: Unable to complete as patient in non-responsive to questions. She stares blankly at me and does not engage on interview. She is disoriented to person, place, time, and situation. She does not follow commands. She is not able to identify objects or state their purpose. Spoke with patient's nurse, who reports patient typically gets agitated and combative towards staff when someone is physically touching her in order to perform patient care or  trying to feed her or give her meds. Otherwise, patient has been staring blankly.  Psych ROS:  Depression: No Anxiety:  No Mania (lifetime and current): No Psychosis: (lifetime and current): No  Review of Systems   Gastrointestinal:  Negative for abdominal pain, constipation, diarrhea, nausea and vomiting.  Neurological:  Negative for dizziness and headaches.  All other systems reviewed and are negative.   Psychiatric and Social History  Psychiatric History:  Information collected from chart review (patient seen on psych consult service in 01/2024).  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 rate 18, height 5' 10 (1.778 m), weight 78.6 kg, SpO2 100%. Body mass index is 24.86 kg/m.  Physical Exam Vitals and nursing note reviewed.  Constitutional:      General: She is not in acute distress.    Appearance: Normal appearance. She is normal weight. She is not ill-appearing.  HENT:     Head: Normocephalic and atraumatic.  Pulmonary:     Effort: Pulmonary effort is normal. No respiratory distress.  Neurological:     Mental Status: She is alert and oriented to person, place, and time.    Mental Status Exam: General Appearance: Casual  Orientation:  Full (Time,  Place, and Person)  Memory:  NA Immediate;   Poor Recent;   Fair Remote;   Fair  Concentration:  Concentration: Poor and Attention Span: Poor  Recall:  Poor  Attention  Poor  Eye Contact:  Fair  Speech:  Normal Rate  Language:  Fair  Volume:  Decreased  Mood: They think I'm crazy  Affect:  Appropriate  Thought Process:  Linear  Thought Content:  NA  Suicidal Thoughts:  No  Homicidal Thoughts:  No  Judgement:  Impaired  Insight:  Lacking  Psychomotor Activity:  Variable patient somnolent at times, but gets intermittently agitated  Akathisia:  No  Fund of Knowledge:  Fair    Assets:  Social Support  Cognition:  Impaired  ADL's:  Impaired  AIMS (if indicated):     Other History   These have been pulled in through the EMR, reviewed, and updated if appropriate.   Family History:  The patient's family history includes Hypertension in her mother.  Medical History: Past Medical History:  Diagnosis Date   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 (HCC) 09/16/2020   COPD (chronic obstructive pulmonary disease) (HCC)    History of DVT (deep vein thrombosis)    Hypertension    Thoracoabdominal aortic aneurysm    TIA (transient ischemic attack) 09/10/2020   Surgical History: Past Surgical History:  Procedure Laterality Date  IR BONE MARROW BIOPSY & ASPIRATION  12/13/2023   IR CT HEAD LTD  09/11/2020   IR INTRAVSC STENT CERV CAROTID W/O EMB-PROT MOD SED INC ANGIO  09/11/2020   IR PERCUTANEOUS ART THROMBECTOMY/INFUSION INTRACRANIAL INC DIAG ANGIO  09/11/2020       IR PERCUTANEOUS ART THROMBECTOMY/INFUSION INTRACRANIAL INC DIAG ANGIO  09/11/2020   IR US  GUIDE VASC ACCESS RIGHT  09/11/2020   NO PAST SURGERIES     RADIOLOGY WITH ANESTHESIA N/A 09/11/2020   Procedure: IR WITH ANESTHESIA;  Surgeon: Dolphus Carrion, MD;  Location: [facility] OR;  Service: Radiology;  Laterality: N/A;   Medications:  Current  Medications[1]  Allergies: Allergies[2]  Ashley LOISE Gravely, MD     [1]  Current Facility-Administered Medications:    amLODipine  (NORVASC ) tablet 10 mg, 10 mg, Oral, Daily, Singh, Prashant K, MD, 10 mg at 04/30/24 9192   cefTRIAXone  (ROCEPHIN ) 2 g in sodium chloride  0.9 % 100 mL IVPB, 2 g, Intravenous, Q1400, Dea Shiner, MD, Last Rate: 200 mL/hr at 04/29/24 1257, 2 g at 04/29/24 1257   DAPTOmycin  (CUBICIN ) IVPB 700 mg/100mL premix, 700 mg, Intravenous, Q1400, Manandhar, Sabina, MD, Last Rate: 200 mL/hr at 04/29/24 1402, 700 mg at 04/29/24 1402   dextrose  50 % solution 50 mL, 1 ampule, Intravenous, Q1H PRN, Howerter, Justin B, DO, 50 mL at 04/30/24 0806   docusate (COLACE) 50 MG/5ML liquid 100 mg, 100 mg, Oral, BID PRN, Alva, Rakesh V, MD   feeding supplement (ENSURE PLUS HIGH PROTEIN) liquid 237 mL, 237 mL, Oral, BID BM, Ghimire, Donalda HERO, MD, 237 mL at 04/29/24 9157   Gerhardt's butt cream, , Topical, BID, Ghimire, Donalda HERO, MD, Given at 04/30/24 0807   hydrALAZINE  (APRESOLINE ) injection 10 mg, 10 mg, Intravenous, Q6H PRN, Singh, Prashant K, MD   HYDROcodone -acetaminophen  (NORCO/VICODIN) 5-325 MG per tablet 1 tablet, 1 tablet, Oral, Q8H PRN, Dennise Lavada POUR, MD, 1 tablet at 04/30/24 0004   ipratropium-albuterol  (DUONEB) 0.5-2.5 (3) MG/3ML nebulizer solution 3 mL, 3 mL, Nebulization, Q6H PRN, Tobie Gaines, DO   lactulose  (CHRONULAC ) 10 GM/15ML solution 20 g, 20 g, Oral, TID, Alva, Rakesh V, MD, 20 g at 04/30/24 9192   melatonin tablet 3 mg, 3 mg, Oral, QHS PRN, Howerter, Justin B, DO, 3 mg at 04/29/24 0138   metoprolol  tartrate (LOPRESSOR ) injection 2.5 mg, 2.5 mg, Intravenous, Q4H PRN, Howerter, Justin B, DO, 2.5 mg at 04/27/24 2152   metoprolol  tartrate (LOPRESSOR ) tablet 12.5 mg, 12.5 mg, Oral, BID, Singh, Prashant K, MD, 12.5 mg at 04/30/24 9192   mirtazapine  (REMERON  SOL-TAB) disintegrating tablet 15 mg, 15 mg, Oral, QHS, Singh, Prashant K, MD, 15 mg at 04/29/24 2229    multivitamin with minerals tablet 1 tablet, 1 tablet, Oral, Daily, Ghimire, Donalda HERO, MD, 1 tablet at 04/30/24 9192   ondansetron  (ZOFRAN ) injection 4 mg, 4 mg, Intravenous, Q6H PRN, Paliwal, Aditya, MD   Oral care mouth rinse, 15 mL, Mouth Rinse, 4 times per day, Jude Harden GAILS, MD, 15 mL at 04/28/24 2128   Oral care mouth rinse, 15 mL, Mouth Rinse, PRN, Alva, Rakesh V, MD   pantoprazole  (PROTONIX ) EC tablet 40 mg, 40 mg, Oral, BID, Pham, Minh Q, RPH-CPP, 40 mg at 04/30/24 0806   polyethylene glycol (MIRALAX  / GLYCOLAX ) packet 17 g, 17 g, Oral, Daily PRN, Alva, Rakesh V, MD   QUEtiapine  (SEROQUEL ) tablet 25 mg, 25 mg, Oral, BID, Singh, Prashant K, MD, 25 mg at 04/30/24 0615   romiPLOStim  (NPLATE ) injection 245 mcg, 3 mcg/kg,  Subcutaneous, Weekly, Rouson, Olam PARAS, NP, 245 mcg at 04/26/24 1322 [2]  Allergies Allergen Reactions   Iodinated Contrast Media Itching and Other (See Comments)    9/1 developed diffuse itching after contrast dye for CT (without rash or airway involvement or GI symptoms) . Consider pretreatment before IV contrast in the future

## 2024-04-30 NOTE — Progress Notes (Signed)
 PT Cancellation Note  Patient Details Name: [name] MRN: [id number] DOB: Jul 09, 1945   Cancelled Treatment:     Pt declining PT session at this time. Education provided and pt repeatedly shakes head No. Pt unable to tell PT reason for refusal. Pt reportedly hitting and biting staff the last couple of days, accepted refusal and will re-attempt at a later date.  [name], PT, DPT  Acute Rehabilitation Services         Office: [phone]     [name] 04/30/2024, 2:22 PM

## 2024-04-30 NOTE — Progress Notes (Signed)
 Nutrition Follow-up  DOCUMENTATION CODES:   Severe malnutrition in context of acute illness/injury  INTERVENTION:  Continue liberalized (therapeutically) diet and monitor tolerance             -Automatic meal trays             -Assistance/encouragement with feeding            Continue Magic cup TID with meals, each supplement provides 290 kcal and 9 grams of protein    Continue Ensure Plus High Protein po BID, each supplement provides 350 kcal and 20 grams of protein    Continue MVI w/ minerals  Continue appetite stimulant  Monitor GOC discussion and modify plan of care as indicated  -If aggressive care preferred and within GOC, recommend artificial nutrition support via PEG   NUTRITION DIAGNOSIS:  Severe Malnutrition related to acute illness as evidenced by moderate fat depletion, moderate muscle depletion.  GOAL:  Patient will meet greater than or equal to 90% of their needs  MONITOR:  PO intake, Supplement acceptance, Diet advancement, Labs, Skin, Weight trends  REASON FOR ASSESSMENT:  Consult Assessment of nutrition requirement/status  ASSESSMENT:   Pt with PMH significant for: CVA w/ left sided hemiparesis, COPD, HTN, HLD, thoracoabdominal aortic aneurysm, TIA. Brought to ED after being found down and unresponsive for unknown amount of time. Found to be hypothermic, hypotensive, hpoglycemic and admitted for presumed undifferentiated shock, acute liver failure, AKI.  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 Currently NPO. Unsure as to why. Discussed with MD/RN. Lactulose  continues prophylactically. Has required additional infusions of platelets due to persistent thrombocytopenia despite tx of potential underlying etiologies. Discussed possible TEE when platelets stable. Now on old indefinitely and unless life threatening due to patient frailty, inability to provide consent with no family identified or reachable as management consultant. Will continue on prolonged antibiotics, per cardiology. ID to determine course and duration. Psych has now determined she has no capacity. Pursuing guardianship at this point.     Average Meal Intake  12/19: 30-50% x2 documented meals 12/21: 0-15% x2 documented meal   Patient has begun to experience significant delirium vs dementia. Mentation fluctuation and she has become aggressive/combative at times. Per report, eating little to no food. Sporadic and inconsistent acceptance of ONS.    Admit Weight: 74.5 kg (first measured) Current Weight: 78.6 kg   Weight stable with slight trend up this admission. Continues with some non-pitting generalized edema.   Meds: lactulose  TID, MVI, mirtazapine , MVI, pantoprazole , IV ABX  Did refeed on dextrose  initially. Electrolytes have stabilized. Does continue to require dextrose  to maintain blood sugars. Noted with low of 55 mg/dL this morning. Noted with appetite stimulant ordered on 12/21.  Labs from 12/22 reviewed:  Na+ 135 (wdl) K+ 3.4>4.6 (wdl) PHOS 2.7 (wdl) Mg 2.0 (wdl) CBGs 55-115 x24 hours    Diet Order:  Diet Order             Diet NPO time specified Except for: Sips with Meds  Diet effective now                   EDUCATION NEEDS:   Not appropriate for education at this  time  Skin:  Skin Assessment: Skin Integrity Issues: Skin Integrity Issues:: Stage II, Unstageable Stage II: L hip, sacrum Unstageable: L buttocks  Last BM:  12/23  Height:  Ht Readings from Last 1 Encounters:  04/13/24 5' 10 (1.778 m)   Weight:  Wt Readings from Last 1 Encounters:  04/30/24 78.6 kg    Ideal Body Weight:  68.2 kg  BMI:  Body mass index is 24.86 kg/m.  Estimated Nutritional Needs:   Kcal:  1600-1800 kcals  Protein:  70-85g  Fluid:  1.6-1.8L/day  Blair Deaner MS, RD, LDN Registered Dietitian Clinical Nutrition RD Inpatient Contact Info in Amion

## 2024-04-30 NOTE — TOC Progression Note (Addendum)
 Transition of Care [facility]) - Progression Note    Patient Details  Name: [name] MRN: [id number] Date of Birth: 1945-12-25  [facility] CM/SW Contact  [name], LCSW Phone Number: 04/30/2024, 2:25 PM  Clinical Narrative:    12pm-CSW sent message to APS worker, Hildreth, to see who is covering her while she is on vacation. MD and CSW unable to get in touch with patient's daughter.  Per [employer], for emergencies can contact her supervisor Jessica Blue at [phone]. CSW left a vm as Harlene is covering the phone.    Expected Discharge Plan: Skilled Nursing Facility Barriers to Discharge: Continued Medical Work up               Expected Discharge Plan and Services In-house Referral: Clinical Social Work   Post Acute Care Choice: Skilled Nursing Facility Living arrangements for the past 2 months: Single Family Home                                       Social Drivers of Health (SDOH) Interventions SDOH Screenings   Food Insecurity: Food Insecurity Present (04/15/2024)  Housing: High Risk (04/22/2024)  Transportation Needs: Unmet Transportation Needs (04/15/2024)  Utilities: At Risk (04/15/2024)  Depression (PHQ2-9): Low Risk (06/04/2021)  Social Connections: Unknown (04/15/2024)  Tobacco Use: Medium Risk (03/22/2024)    Readmission Risk Interventions    01/15/2024    2:24 PM  Readmission Risk Prevention Plan  Transportation Screening Complete  HRI or Home Care Consult Complete  Social Work Consult for Recovery Care Planning/Counseling Complete  Palliative Care Screening Not Applicable  Medication Review Oceanographer) Complete
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
## 2024-04-30 NOTE — Progress Notes (Addendum)
 Sundeep Cary   DOB:02-01-1946   FM#:[id number]      ASSESSMENT:  Shanna Un is an 78 year old female patient admitted on 04/13/2024 with complaints of altered mental status. Hematologic history is significant for thrombocytopenia and anemia. Heme-Onc following.   Hematologic history: - Platelets with initial response status post platelet transfusion.  Continues to downtrend despite IVIG x 2 days and 1 dose Nplate  given 04/21/2024. - May be secondary to acute illness and infection.  Immature platelet count is not elevated which does not support ITP although ITP is not ruled out.  TTP is ruled out. - Patient has history of thrombocytopenia and was seen in outpatient hematology office on 12/11/2023 for her platelets were noted to be mildly low 80K at that time. - Platelets smear WNL - Hold on steroids at this time given possible endocarditis. - Status post IVIG 1 g/kg x 2 days on 12/12 and 12/13. - Status post Nplate  given 04/21/2024.  Nplate  3 mcg/kg given 04/26/2024.  Assessment & plan: Thrombocytopenia - Platelets appear to be improving.  Platelet count 62K today. - Recommend platelet transfusion for platelet count <20 K or <50 K with acute bleeding. - Plan for weekly Nplate  to be given 05/03/2024, ordered. -No bleeding noted today.  Continue to monitor closely for bleeding - Monitor CBC with differential  Altered mental status - Initially seemed to be improving however confusion is worse.  Nurse reports that patient has been very combative towards the staff and has also been refusing to eat more than a couple bites. - Negative CT head. - This is likely secondary to liver failure and multiple comorbidities. - Psych eval done today - Staff unable to contact patient's family.  Pending SNF discharge. - Continue supportive care  Acute liver failure - Unclear etiology - LFTs have been improving. - Bilirubin normalized. - Avoid hepatotoxic agents  Anemia History of GI bleed -  Improved - Status post PRBC transfusion 04/28/2024. - Hemoglobin 10.1. - Recommend PRBC transfusion for hemoglobin <7.0 - Continue to monitor CBC with differential  Infectious endocarditis - Plan for TEE after platelets improved - Cardio following      Code Status Full   Subjective:  Patient seen resting comfortably in bed, awakens easily to verbal and tactile stimuli.  Bilateral mitts intact.  Patient with obvious confusion, appears more confused than during previous heme visit.  Discussed case with nurse who states that patient is refusing to eat more than a few bites.  Attempted to discuss with patient who has no insight, she nods and shakes her head.  Objective:   Intake/Output Summary (Last 24 hours) at 04/30/2024 1404 Last data filed at 04/29/2024 1600 Gross per 24 hour  Intake --  Output 500 ml  Net -500 ml     PHYSICAL EXAMINATION: ECOG PERFORMANCE STATUS: 4 - Bedbound  Vitals:   04/30/24 0800 04/30/24 1200  BP: (!) 160/75 (!) 118/55  Pulse: 72 63  Resp: 18 19  Temp: 98 F (36.7 C) 97.6 F (36.4 C)  SpO2: 100%    Filed Weights   04/28/24 0421 04/29/24 0346 04/30/24 0500  Weight: 177 lb 4 oz (80.4 kg) 178 lb 12.7 oz (81.1 kg) 173 lb 4.5 oz (78.6 kg)    GENERAL: alert, no distress and comfortable SKIN: skin color, texture, turgor are normal, no rashes or significant lesions EYES: normal, conjunctiva are pink and non-injected, sclera clear OROPHARYNX: + Hard of hearing  NECK: supple, thyroid  normal size, non-tender, without nodularity LYMPH: no palpable lymphadenopathy in  the cervical, axillary or inguinal LUNGS: clear to auscultation and percussion with normal breathing effort HEART: regular rate & rhythm and no murmurs and no lower extremity edema ABDOMEN: abdomen soft, non-tender and normal bowel sounds MUSCULOSKELETAL: + Unable to ambulate  PSYCH: + Altered mental status NEURO: no focal motor/sensory deficits   All questions were answered. The  patient knows to call the clinic with any problems, questions or concerns.   I personally spent a total of 40 minutes minutes in the care of the patient today including preparing to see the patient, performing a medically appropriate exam/evaluation, counseling and educating, placing orders, referring and communicating with other health care professionals, documenting clinical information in the EHR, communicating results, and coordinating care.    Olam PARAS Rouson, NP 04/30/2024 2:04 PM    Labs Reviewed:  Lab Results  Component Value Date   WBC 2.1 (L) 04/30/2024   HGB 10.1 (L) 04/30/2024   HCT 30.6 (L) 04/30/2024   MCV 93.9 04/30/2024   PLT 62 (L) 04/30/2024   Recent Labs    04/14/24 0957 04/14/24 1958 04/15/24 0248 04/23/24 0351 04/24/24 0328 04/25/24 0346 04/26/24 0711 04/27/24 0456 04/28/24 0413 04/29/24 0704  NA 146* 143   < > 134* 133* 132*   < > 135 136 135  K 2.7* 3.5   < > 3.8 3.9 4.0   < > 3.7 3.4* 4.6  CL 110 110   < > 105 106 105   < > 108 107 107  CO2 18* 20*   < > 24 21* 22   < > 21* 21* 19*  GLUCOSE 88 167*   < > 97 70 65*   < > 74 89 55*  BUN 42* 40*   < > 11 10 10    < > <5* 7* 7*  CREATININE 1.59* 1.43*   < > 0.57 0.52 0.49   < > 0.45 0.50 0.49  CALCIUM  7.4* 7.4*   < > 7.6* 8.0* 8.0*   < > 7.9* 7.9* 8.2*  GFRNONAA 33* 37*   < > >60 >60 >60   < > >60 >60 >60  PROT 5.4* 5.3*   < > 7.8 6.9 6.6  --   --   --   --   ALBUMIN  1.9* 1.9*   < > 2.1* 2.2* 2.3*  --   --   --   --   AST 2,619* 1,795*   < > 71* 64* 57*  --   --   --   --   ALT 1,437* 1,236*   < > 105* 100* 81*  --   --   --   --   ALKPHOS 82 82   < > 110 114 108  --   --   --   --   BILITOT 2.2* 2.0*   < > 1.4* 1.0 0.7  --   --   --   --   BILIDIR 1.0* 1.0*  --   --   --   --   --   --   --   --   IBILI 1.2* 1.0*  --   --   --   --   --   --   --   --    < > = values in this interval not displayed.    Studies Reviewed:   DG Chest Port 1 View Result Date: 04/26/2024 EXAM: 1 VIEW(S) XRAY OF THE  CHEST 04/25/2024 11:51:00 PM COMPARISON: 04/22/2024 CLINICAL HISTORY:  SVT (supraventricular tachycardia) FINDINGS: LUNGS AND PLEURA: Stable elevation of the right hemidiaphragm. Left basilar calcified pleural plaque along the left hemidiaphragm again noted. Increasing atelectasis or infiltrate at the left lung base. Pulmonary vascularity is normal. No pleural effusion. No pneumothorax. HEART AND MEDIASTINUM: Aortic atherosclerosis. Cardiac size within normal limits. BONES AND SOFT TISSUES: No acute osseous abnormality. IMPRESSION: 1. Increasing atelectasis or infiltrate at the left lung base. 2. Stable elevation of the right hemidiaphragm. Electronically signed by: Dorethia Molt MD 04/26/2024 12:00 AM EST RP Workstation: [id number]   DG Chest Port 1 View Result Date: 04/22/2024 EXAM: 1 VIEW(S) XRAY OF THE CHEST 04/22/2024 10:41:00 PM COMPARISON: 04/13/2024 CLINICAL HISTORY: Tachycardia. FINDINGS: LUNGS AND PLEURA: Right mid lung zone linear atelectasis. Left hemidiaphragm calcified pleural plaque. Elevated right hemidiaphragm. No pleural effusion. No pneumothorax. HEART AND MEDIASTINUM: Aortic atherosclerosis. No acute abnormality of the cardiac and mediastinal silhouettes. BONES AND SOFT TISSUES: No acute osseous abnormality. IMPRESSION: 1. No acute cardiopulmonary process. 2. Right mid lung linear atelectasis. 3. Calcified pleural plaque along the left hemidiaphragm. Electronically signed by: [name] 04/22/2024 10:44 PM EST RP Workstation: [id number]   US  ABD LTD RUG W/LIVER DOPPLER Result Date: 04/16/2024 EXAM: RIGHT UPPER QUADRANT ULTRASOUND WITH DOPPLER EVALUATION 04/15/2024 11:50:00 PM COMPARISON: Right upper quadrant ultrasound 04/14/2024. CT 04/13/2024. CLINICAL HISTORY: Elevated LFTs. FINDINGS: LIVER: Increased echotexture throughout the liver is compatible with fatty infiltration. No evidence of intrahepatic biliary ductal dilatation. OTHER: No evidence of right upper quadrant ascites.  Vascular/Doppler: Doppler interrogation of the abdominal vasculature was performed. There is normal Doppler flow in the normal direction of the hepatic vasculature. Normal hepatopetal flow of the hepatic artery and portal vein. Normal hepatofugal flow of the hepatic veins. Normal Doppler waveforms are visualized. IMPRESSION: 1. Hepatic steatosis. 2. No evidence of hepatic vein or portal vein thrombosis. Electronically signed by: Franky Crease MD 04/16/2024 01:23 AM EST RP Workstation: [id number]   DG Swallowing Func-Speech Pathology Result Date: 04/15/2024 Table formatting from the original result was not included. Modified Barium Swallow Study Patient Details Name: Sterling Ucci MRN: [id number] Date of Birth: [birth year] Today's Date: 04/15/2024 HPI/PMH: HPI: Ms. Stepanian is a 78 y/o female presenting 04/14/24 after roommate called EMS as the patient was unresponsive. She reports chest pain and was very confused. Pt found under single blanket, alone with no heat or lights on in home.   PMHx:  HTN, HLD, CVA left hemiparesis, CAPD, DVT, IVH. UA positive. SLP consulted for clinical swallow assessment. Clinical Impression: Pt presents with a mild-moderate oropharyngeal dysphagia per MBSS completed today. Recommend a mechanical altered/ground diet with nectar-thick liquids (straw ok), and meds crushed in applesauce. Oral deficits included reduced oral strength and coordination resulting in intermittent anterior loss of liquids by spoon or cup sip. Pt impulsively drank all liquids and was unable to try single sips despite cues. There was consistent posterior loss of thin and nectar-thick liquids to the level of the pyriform sinuses pre swallow with some pooling. There was piecemeal swallow of pudding and softened cracker. Min cracker residue on hard palate cleared with prompted nectar-thick liquid wash. Pharyngeal deficits included reduced base of tongue retraction, reduced laryngeal vestibule closure, reduced pharyngeal  stripping, and reduced distention of UES. Findings -There was transient aspiration of thin liquid residue during cleansing swallows. Residue was ejected to the level of the vocal cords (PAS-5) or the laryngeal vestibule (PAS-3) during swallow, which was inaudible. No stagnant aspiration in the airway observed. -There x1 instance of stagnant penetration of nectar-thick liquid  wash post cracker. Penetrated residue did start to progress to level of the vocal cords (PAS3 - 5); though ejected with cued throat clear + swallow. All other penetration instances of nectar-thick liquids were transient. -There was min diffuse pharyngeal residue following liquids and pudding. A moderate amount of pharyngeal residue observed post cracker trial, which was cleared with cued dry swallow. Plan: SLP will follow up to assess diet tolerance and modify diet as indicated. Pt may benefit from a repeat MBSS prior to liquid advancement given that inaudible responses to penetration/aspiration events. Factors that may increase risk of adverse event in presence of aspiration Noe & Lianne 2021): Factors that may increase risk of adverse event in presence of aspiration Noe & Lianne 2021): Reduced cognitive function; Limited mobility; Frail or deconditioned; Dependence for feeding and/or oral hygiene; Inadequate oral hygiene; Reduced saliva; Weak cough Recommendations/Plan: Swallowing Evaluation Recommendations Swallowing Evaluation Recommendations Recommendations: PO diet PO Diet Recommendation: Dysphagia 2 (Finely chopped); Mildly thick liquids (Level 2, nectar thick) Liquid Administration via: Cup; Straw Medication Administration: Crushed with puree Supervision: Staff to assist with self-feeding; Full supervision/cueing for swallowing strategies Swallowing strategies  : Minimize environmental distractions; Slow rate; Small bites/sips; Clear throat intermittently; Follow solids with liquids Postural changes: Position pt fully upright  for meals Oral care recommendations: Oral care BID (2x/day) Treatment Plan Treatment Plan Treatment recommendations: Therapy as outlined in treatment plan below Follow-up recommendations: Skilled nursing-short term rehab (<3 hours/day) Functional status assessment: Patient has had a recent decline in their functional status and demonstrates the ability to make significant improvements in function in a reasonable and predictable amount of time. Treatment frequency: Min 1x/week Treatment duration: 2 weeks Interventions: Diet toleration management by SLP; Trials of upgraded texture/liquids; Patient/family education; Compensatory techniques Recommendations Recommendations for follow up therapy are one component of a multi-disciplinary discharge planning process, led by the attending physician.  Recommendations may be updated based on patient status, additional functional criteria and insurance authorization. Assessment: Orofacial Exam: Orofacial Exam Oral Cavity: Oral Hygiene: Dried secretions Oral Cavity - Dentition: Poor condition; Missing dentition Orofacial Anatomy: WFL Oral Motor/Sensory Function: Unable to test Anatomy: Anatomy: WFL Boluses Administered: Boluses Administered Boluses Administered: Thin liquids (Level 0); Mildly thick liquids (Level 2, nectar thick); Puree; Solid  Oral Impairment Domain: Oral Impairment Domain Lip Closure: Escape from interlabial space or lateral juncture, no extension beyond vermillion border Tongue control during bolus hold: Posterior escape of greater than half of bolus Bolus preparation/mastication: Disorganized chewing/mashing with solid pieces of bolus unchewed Bolus transport/lingual motion: Slow tongue motion Oral residue: Residue collection on oral structures Location of oral residue : Tongue Initiation of pharyngeal swallow : Pyriform sinuses  Pharyngeal Impairment Domain: Pharyngeal Impairment Domain Soft palate elevation: No bolus between soft palate (SP)/pharyngeal wall  (PW) Laryngeal elevation: Complete superior movement of thyroid  cartilage with complete approximation of arytenoids to epiglottic petiole Anterior hyoid excursion: Complete anterior movement Epiglottic movement: Complete inversion Laryngeal vestibule closure: Incomplete, narrow column air/contrast in laryngeal vestibule Pharyngeal stripping wave : Present - diminished Pharyngeal contraction (A/P view only): N/A Pharyngoesophageal segment opening: Partial distention/partial duration, partial obstruction of flow Tongue base retraction: Narrow column of contrast or air between tongue base and PPW Pharyngeal residue: Collection of residue within or on pharyngeal structures Location of pharyngeal residue: Tongue base; Valleculae; Pyriform sinuses  Esophageal Impairment Domain: Esophageal Impairment Domain Esophageal clearance upright position: Esophageal retention Pill: No data recorded Penetration/Aspiration Scale Score: Penetration/Aspiration Scale Score 1.  Material does not enter airway: Puree; Solid 2.  Material enters airway, remains ABOVE vocal cords then ejected out: Mildly thick liquids (Level 2, nectar thick) 3.  Material enters airway, remains ABOVE vocal cords and not ejected out: Mildly thick liquids (Level 2, nectar thick); Thin liquids (Level 0) 5.  Material enters airway, CONTACTS cords and not ejected out: Thin liquids (Level 0) 6.  Material enters airway, passes BELOW cords then ejected out: Thin liquids (Level 0) Compensatory Strategies: Compensatory Strategies Compensatory strategies: Yes Multiple swallows: Effective (when pt able to follow cue) Effective Multiple Swallows: Solid Chin tuck: -- (unable to attempt) Other(comment): Effective (throat clear+ swallow to eject penetration) Effective Other(comment): Mildly thick liquid (Level 2, nectar thick)   General Information: Caregiver present: No  Diet Prior to this Study: NPO   Temperature : Normal   Respiratory Status: WFL   Supplemental O2: None  (Room air)   History of Recent Intubation: No  Behavior/Cognition: Alert; Cooperative [facility]) Self-Feeding Abilities: Needs set-up for self-feeding Baseline vocal quality/speech: Hypophonia/low volume Volitional Cough: Unable to elicit (occasional throat clear elicited) Volitional Swallow: Able to elicit (delayed) No data recorded Goal Planning: Prognosis for improved oropharyngeal function: Fair No data recorded No data recorded Patient/Family Stated Goal: none stated Consulted and agree with results and recommendations: Patient; Nurse Pain: Pain Assessment Pain Assessment: No/denies pain Pain Score: 0 Faces Pain Scale: 2 Facial Expression: 0 Body Movements: 0 Muscle Tension: 0 Compliance with ventilator (intubated pts.): N/A Vocalization (extubated pts.): 0 CPOT Total: 0 Pain Location: grimacing with mobility tasks Pain Descriptors / Indicators: Grimacing End of Session: Start Time:SLP Start Time (ACUTE ONLY): 1438 Stop Time: SLP Stop Time (ACUTE ONLY): 1500 Time Calculation:SLP Time Calculation (min) (ACUTE ONLY): 22 min Charges: SLP Evaluations $ SLP Speech Visit: 1 Visit SLP Evaluations $MBS Swallow: 1 Procedure SLP visit diagnosis: SLP Visit Diagnosis: Dysphagia, oropharyngeal phase (R13.12) Past Medical History: Past Medical History: Diagnosis Date  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 (HCC) 09/16/2020  COPD (chronic obstructive pulmonary disease) (HCC)   History of DVT (deep vein thrombosis)   Hypertension   Thoracoabdominal aortic aneurysm   TIA (transient ischemic attack) 09/10/2020 Past Surgical History: Past Surgical History: Procedure Laterality Date  IR BONE MARROW BIOPSY & ASPIRATION  12/13/2023  IR CT HEAD LTD  09/11/2020  IR INTRAVSC STENT CERV CAROTID W/O EMB-PROT MOD SED INC ANGIO  09/11/2020  IR PERCUTANEOUS ART THROMBECTOMY/INFUSION INTRACRANIAL INC DIAG ANGIO  09/11/2020     IR PERCUTANEOUS ART THROMBECTOMY/INFUSION INTRACRANIAL INC DIAG ANGIO  09/11/2020   IR US  GUIDE VASC ACCESS RIGHT  09/11/2020  NO PAST SURGERIES    RADIOLOGY WITH ANESTHESIA N/A 09/11/2020  Procedure: IR WITH ANESTHESIA;  Surgeon: Dolphus Carrion, MD;  Location: [facility] OR;  Service: Radiology;  Laterality: N/A; [name] 04/15/2024, 3:33 PM  ECHOCARDIOGRAM COMPLETE Result Date: 04/15/2024    ECHOCARDIOGRAM REPORT   Patient Name:   AVIANA SHEVLIN Date of Exam: 04/15/2024 Medical Rec #:  [id number]   Height:       70.0 in Accession #:    [id number]  Weight:       164.2 lb Date of Birth:  07/13/1945  BSA:          1.920 m Patient Age:    78 years    BP:           134/57 mmHg Patient Gender: F           HR:           56 bpm. Exam  Location:  Inpatient Procedure: 2D Echo, Cardiac Doppler and Color Doppler (Both Spectral and Color            Flow Doppler were utilized during procedure). Indications:    Shock  History:        Patient has prior history of Echocardiogram examinations, most                 recent 01/12/2024. Stroke, Signs/Symptoms:Altered Mental Status;                 Risk Factors:Dyslipidemia and Hypertension. ICH.  Sonographer:    Ellouise Mose RDCS Referring Phys: [phone] RAKESH V ALVA IMPRESSIONS  1. Left ventricular ejection fraction, by estimation, is 50 to 55%. Left ventricular ejection fraction by 2D MOD biplane is 51.5 %. Left ventricular ejection fraction by PLAX is 43 %. The left ventricle has low normal function. The left ventricle demonstrates global hypokinesis. There is moderate left ventricular hypertrophy. Left ventricular diastolic parameters are consistent with Grade I diastolic dysfunction (impaired relaxation).  2. Right ventricular systolic function is normal. The right ventricular size is normal. Tricuspid regurgitation signal is inadequate for assessing PA pressure.  3. The mitral valve is degenerative. Trivial mitral valve regurgitation. No evidence of mitral stenosis.  4. Calcified mass on the non-coronary cusp with some mobile material, measures 0.5 x 1.0 cm, could be old  vegetation or thrombus. The aortic valve is tricuspid. There is severe thickening of the aortic valve. Aortic valve regurgitation is not visualized. Aortic valve sclerosis/calcification is present, without any evidence of aortic stenosis.  5. Aortic dilatation noted. There is mild dilatation of the ascending aorta, measuring 40 mm.  6. The inferior vena cava is dilated in size with <50% respiratory variability, suggesting right atrial pressure of 15 mmHg. Comparison(s): Changes from prior study are noted. 01/12/2024: LVEF 60-65%. FINDINGS  Left Ventricle: Left ventricular ejection fraction, by estimation, is 50 to 55%. Left ventricular ejection fraction by PLAX is 43 %. Left ventricular ejection fraction by 2D MOD biplane is 51.5 %. The left ventricle has low normal function. The left ventricle demonstrates global hypokinesis. 3D ejection fraction reviewed and evaluated as part of the interpretation. Alternate measurement of EF is felt to be most reflective of LV function. The left ventricular internal cavity size was normal in size. There is moderate left ventricular hypertrophy. Left ventricular diastolic parameters are consistent with Grade I diastolic dysfunction (impaired relaxation). Indeterminate filling pressures. Right Ventricle: The right ventricular size is normal. No increase in right ventricular wall thickness. Right ventricular systolic function is normal. Tricuspid regurgitation signal is inadequate for assessing PA pressure. Left Atrium: Left atrial size was normal in size. Right Atrium: Right atrial size was normal in size. Pericardium: There is no evidence of pericardial effusion. Mitral Valve: The mitral valve is degenerative in appearance. Mild to moderate mitral annular calcification. Trivial mitral valve regurgitation. No evidence of mitral valve stenosis. Tricuspid Valve: The tricuspid valve is normal in structure. Tricuspid valve regurgitation is not demonstrated. No evidence of tricuspid  stenosis. Aortic Valve: Calcified mass on the non-coronary cusp with some mobile material, measures 0.5 x 1.0 cm, could be old vegetation or thrombus. The aortic valve is tricuspid. There is severe thickening of the aortic valve. Aortic valve regurgitation is not visualized. Aortic valve sclerosis/calcification is present, without any evidence of aortic stenosis. Pulmonic Valve: The pulmonic valve was normal in structure. Pulmonic valve regurgitation is not visualized. No evidence of pulmonic stenosis. Aorta: Aortic dilatation noted. There is  mild dilatation of the ascending aorta, measuring 40 mm. Venous: The inferior vena cava is dilated in size with less than 50% respiratory variability, suggesting right atrial pressure of 15 mmHg. IAS/Shunts: No atrial level shunt detected by color flow Doppler. Additional Comments: 3D was performed not requiring image post processing on an independent workstation and was abnormal.  LEFT VENTRICLE PLAX 2D                        Biplane EF (MOD) LV EF:         Left            LV Biplane EF:   Left                ventricular                      ventricular                ejection                         ejection                fraction by                      fraction by                PLAX is 43                       2D MOD                %.                               biplane is LVIDd:         4.70 cm                          51.5 %. LVIDs:         3.70 cm LV PW:         1.50 cm         Diastology LV IVS:        1.50 cm         LV e' medial:    3.81 cm/s LVOT diam:     2.30 cm         LV E/e' medial:  11.5 LV SV:         87              LV e' lateral:   3.70 cm/s LV SV Index:   45              LV E/e' lateral: 11.9 LVOT Area:     4.15 cm  LV Volumes (MOD) LV vol d, MOD    96.7 ml       3D Volume EF: A2C:                           3D EF:        46 % LV vol d, MOD    81.7 ml       LV EDV:       174 ml A4C:  LV ESV:       95 ml LV vol s, MOD    44.3 ml        LV SV:        79 ml A2C: LV vol s, MOD    42.3 ml A4C: LV SV MOD A2C:   52.4 ml LV SV MOD A4C:   81.7 ml LV SV MOD BP:    46.9 ml RIGHT VENTRICLE             IVC RV S prime:     17.70 cm/s  IVC diam: 2.10 cm TAPSE (M-mode): 1.9 cm                             PULMONARY VEINS                             Diastolic Velocity: 27.90 cm/s                             S/D Velocity:       1.60                             Systolic Velocity:  44.10 cm/s LEFT ATRIUM             Index        RIGHT ATRIUM           Index LA diam:        3.90 cm 2.03 cm/m   RA Area:     11.40 cm LA Vol (A2C):   39.3 ml 20.47 ml/m  RA Volume:   24.10 ml  12.55 ml/m LA Vol (A4C):   42.8 ml 22.30 ml/m LA Biplane Vol: 41.7 ml 21.72 ml/m  AORTIC VALVE LVOT Vmax:   100.00 cm/s LVOT Vmean:  58.600 cm/s LVOT VTI:    0.210 m  AORTA Ao Root diam: 3.30 cm Ao Asc diam:  3.95 cm MITRAL VALVE MV Area (PHT): 2.32 cm    SHUNTS MV Decel Time: 327 msec    Systemic VTI:  0.21 m MV E velocity: 44.00 cm/s  Systemic Diam: 2.30 cm MV A velocity: 89.70 cm/s MV E/A ratio:  0.49 Vinie Maxcy MD Electronically signed by Vinie Maxcy MD Signature Date/Time: 04/15/2024/3:29:26 PM    Final    DG Abd Portable 1V Result Date: 04/14/2024 EXAM: 1 VIEW XRAY OF THE ABDOMEN 04/14/2024 06:02:00 PM COMPARISON: Same day knees. CLINICAL HISTORY: Confusion. FINDINGS: LINES, TUBES AND DEVICES: Nasogastric tube tip has been advanced into body of stomach and is in grossly good position. BOWEL: Nonobstructive bowel gas pattern. SOFT TISSUES: No abnormal calcifications. BONES: No acute fracture. IMPRESSION: 1. Nasogastric tube tip projects over the body of the stomach in expected position. Electronically signed by: Lynwood Seip MD 04/14/2024 06:13 PM EST RP Workstation: [id number]   DG Abd 1 View Result Date: 04/14/2024 EXAM: 1 VIEW XRAY OF THE ABDOMEN 04/14/2024 12:00:00 PM COMPARISON: None available. CLINICAL HISTORY: Nasogastric tube present. FINDINGS: LINES, TUBES AND  DEVICES: Enteric tube in place with tip projecting over the stomach. Side port projecting over the distal esophagus. BOWEL: Nonobstructive bowel gas pattern. SOFT TISSUES: Atherosclerotic calcifications and abdominal aortic aneurysm. BONES: No acute fracture. IMPRESSION: 1. Enteric tube in place with tip projecting over the stomach and side port projecting over  the distal esophagus. Recommend advancing the tube so the side port is beyond the gastroesophageal junction. 2. Abdominal aortic aneurysm with atherosclerotic calcifications. This is described in greater detail on CT of the abdomen and pelvis without contrast 04/13/2024 . Electronically signed by: Lonni Necessary MD 04/14/2024 04:27 PM EST RP Workstation: [id number]   US  RENAL Result Date: 04/14/2024 EXAM: US  Retroperitoneum Complete, Renal. 04/14/2024 12:20:00 PM TECHNIQUE: Real-time ultrasonography of the retroperitoneum renal was performed. COMPARISON: None available CLINICAL HISTORY: Acute kidney failure. FINDINGS: FINDINGS: RIGHT KIDNEY/URETER: Right kidney measures 10.5 x 5.5 x 5.0 cm. Normal cortical echogenicity. No hydronephrosis. No calculus. No mass. LEFT KIDNEY/URETER: Left kidney measures 10.5 x 5.3 x 4.9 cm. Normal cortical echogenicity. No hydronephrosis. No calculus. No mass. BLADDER: Urinary bladder is decompressed secondary to foley catheter. IMPRESSION: 1. No acute findings. Electronically signed by: Lynwood Seip MD 04/14/2024 12:50 PM EST RP Workstation: [id number]   US  Abdomen Limited RUQ (LIVER/GB) Result Date: 04/14/2024 EXAM: Right Upper Quadrant Abdominal Ultrasound TECHNIQUE: Real-time ultrasonography of the right upper quadrant of the abdomen was performed. COMPARISON: None available. CLINICAL HISTORY: LFT elevation. FINDINGS: LIVER: Increased hepatic steatosis. No intrahepatic biliary ductal dilatation. No mass. BILIARY SYSTEM: No evidence of pericholecystic fluid or wall thickening. No cholelithiasis. Common bile duct is  within normal limits measuring 2.3 mm. RIGHT KIDNEY: The right kidney is grossly unremarkable in appearances without evidence of hydronephrosis, echogenic calculi or worrisome mass lesions. OTHER: No right upper quadrant ascites. IMPRESSION: 1. Hepatic steatosis. Electronically signed by: Morgane Naveau MD 04/14/2024 12:40 AM EST RP Workstation: [id number]   DG Chest Port 1 View Result Date: 04/13/2024 EXAM: 1 VIEW(S) XRAY OF THE CHEST 04/13/2024 08:39:00 PM COMPARISON: 01/11/2024. CLINICAL HISTORY: Questionable sepsis - evaluate for abnormality FINDINGS: LUNGS AND PLEURA: Left pleural calcifications noted. No focal pulmonary opacity. No pleural effusion. No pneumothorax. HEART AND MEDIASTINUM: Aortic atherosclerosis. BONES AND SOFT TISSUES: No acute osseous abnormality. IMPRESSION: 1. No acute cardiopulmonary abnormality. 2. Left pleural calcifications. 3. Aortic atherosclerosis. Electronically signed by: Franky Crease MD 04/13/2024 09:40 PM EST RP Workstation: [id number]   CT CHEST ABDOMEN PELVIS WO CONTRAST Result Date: 04/13/2024 EXAM: CT CHEST, ABDOMEN AND PELVIS WITHOUT CONTRAST 04/13/2024 08:59:36 PM TECHNIQUE: CT of the chest, abdomen and pelvis was performed without the administration of intravenous contrast. Multiplanar reformatted images are provided for review. Automated exposure control, iterative reconstruction, and/or weight based adjustment of the mA/kV was utilized to reduce the radiation dose to as low as reasonably achievable. COMPARISON: 01/12/2024 CLINICAL HISTORY: abd pain FINDINGS: CHEST: MEDIASTINUM AND LYMPH NODES: Heart and pericardium are unremarkable. Diffuse coronary artery and aortic atherosclerosis. The central airways are clear. No mediastinal, hilar or axillary lymphadenopathy. LUNGS AND PLEURA: Like scarring in the left lower lobe posteriorly. Calcified pleural plaques in the left hemithorax, stable. No focal consolidation or pulmonary edema. No pleural effusion or pneumothorax.  ABDOMEN AND PELVIS: LIVER: The liver is unremarkable. GALLBLADDER AND BILE DUCTS: Gallbladder is unremarkable. No biliary ductal dilatation. SPLEEN: No acute abnormality. PANCREAS: No acute abnormality. ADRENAL GLANDS: No acute abnormality. KIDNEYS, URETERS AND BLADDER: Foley catheter in the bladder, which is decompressed. No stones in the kidneys or ureters. No hydronephrosis. No perinephric or periureteral stranding. GI AND BOWEL: Stomach demonstrates no acute abnormality. Normal appendix. There is no bowel obstruction. REPRODUCTIVE ORGANS: No acute abnormality. PERITONEUM AND RETROPERITONEUM: No ascites. No free air. VASCULATURE: Infrarenal abdominal aortic aneurysm measures up to 4.2 cm. 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 Diffuse aortoiliac  atherosclerosis. ABDOMINAL AND PELVIS LYMPH NODES: No lymphadenopathy. BONES AND SOFT TISSUES: No acute osseous abnormality. No focal soft tissue abnormality. IMPRESSION: 1. 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 Diffuse aortoiliac atherosclerosis; recommend CT or MRI in 12 months and establish or continue care with a vascular specialist. 2. Coronary artery disease. 3. Calcified pleural plaques on the left. Scarring at the left lung base. Electronically signed by: Franky Crease MD 04/13/2024 09:06 PM EST RP Workstation: [id number]   CT Head Wo Contrast Result Date: 04/13/2024 EXAM: CT HEAD WITHOUT CONTRAST 04/13/2024 08:58:58 PM TECHNIQUE: CT of the head was performed without the administration of intravenous contrast. Automated exposure control, iterative reconstruction, and/or weight based adjustment of the mA/kV was utilized to reduce the radiation dose to as low as reasonably achievable. COMPARISON: 12/10/2023 CLINICAL HISTORY: delirium FINDINGS: BRAIN AND VENTRICLES: No acute hemorrhage. No evidence of acute infarct. Age-related cerebral and cerebellar volume  loss. Remote encephalomalacia in right parietal lobe. Mild periventricular white matter disease. Ex vacuo dilatation of right lateral ventricle. No extra-axial collection. No mass effect or midline shift. Moderate calcific atheromatous disease within carotid siphons and vertebral arteries. ORBITS: No acute abnormality. SINUSES: Moderate bilateral ethmoid sinus disease. Bilateral maxillary sinus disease with air-fluid levels. Mild bilateral sphenoid and frontal sinus disease. SOFT TISSUES AND SKULL: No acute soft tissue abnormality. No skull fracture. IMPRESSION: 1. No acute intracranial abnormality. 2. Stable right parietal cortical encephalomalacia. 3. Stable age-related changes. 4. Bilateral maxillary sinus disease with air-fluid levels, with additional ethmoid, sphenoid, and frontal sinus involvement, suggestive of acute sinusitis. Electronically signed by: Dorethia Molt MD 04/13/2024 09:05 PM EST RP Workstation: [id number]    Addendum I have seen the patient, examined her. I agree with the assessment and and plan and have edited the notes.   Chart reviewed, her thrombocytopenia is improving, 62K today, no bleeding.  Plan to give a third dose Nplate  this Friday and if platelets still below 150K. OK to stop after her plt>150K.  Patient is overall declining, her overall prognosis is poor.  I will be out of office for the next 2-1/2 weeks, NP Olam will monitor her lab, please call my on-call partner us  if needed.   Onita Mattock  04/30/2024

## 2024-04-30 NOTE — Consult Note (Signed)
 " Palliative Medicine Inpatient Consult Note  Consulting Provider:  Dennise Lavada POUR, MD   Reason for consult:   Palliative Care Consult Services Palliative Medicine Consult  Reason for Consult? Confused, poor oral intake refuses to eat, no capacity to decide for herself, daughter does Mack return phone call, goals of care?   04/30/2024  HPI:  Per intake H&P -->  [name] is a 78 y.o. female who has a past medical history of CVA with left-sided hemiplegia, COPD, & HTN.  [name] has been hospitalized for 16 days in the setting of complicated delirium secondary to sepsis from a urinary source.  Palliative care has been asked to support additional goals of care conversations in the setting of adult failure to thrive.  Clinical Assessment/Goals of Care:  *Please note that this is a verbal dictation therefore any spelling or grammatical errors are due to the Dragon Medical One system interpretation.  I have reviewed medical records including EPIC notes, labs and imaging, received report from bedside RN, assessed the patient who is lying in bed able to tell me who she is and where she is but does Mack have a good understanding of why she is hospitalized.    I spoke with [name] to further discuss diagnosis prognosis, GOC, EOL wishes, disposition and options.   I introduced Palliative Medicine as specialized medical care for people living with serious illness. It focuses on providing relief from the symptoms and stress of a serious illness. The goal is to improve quality of life for both the patient and the family.  Medical History Review and Understanding:  A review of [name]'s past medical history significant for left-sided hemiplegia in the setting of a stroke, hypertension, congestive heart failure, & COPD was completed.  Social History:  [name] shares that she is originally from [place] .  She shares that she lives in [place] to be closer to her family.  She lives with her daughter,  [name] and her grandson.  She states she has 8 children and 20 grandchildren.  [name] shares she did have a great grandchild who unfortunately has passed away.  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  [name] shares that she is a woman of faith practicing within Christianity.  Functional and Nutritional State:  Preceding hospitalization [name] had been living with her daughter and grandson.  She was reliant upon them for all basic activities of daily living in the setting of her left-sided hemiplegia.  She was her self-report eating well at home but I shared it was apparent this may have Mack been the case as she is identified on admission to be significantly dehydrated.  Advance Directives:  A detailed discussion was had today regarding advanced directives.  Patient shares she would want her daughter, [name] to be her surrogate management consultant.  Code Status:  Concepts specific to code status, artifical feeding and hydration, continued IV antibiotics and rehospitalization was had.  The difference between a aggressive medical intervention path  and a palliative comfort care path for this patient at this time was had.   Encouraged patient/family to consider DNR/DNI status understanding evidenced based poor outcomes in similar hospitalized patient, as the cause of arrest is likely associated with advanced chronic/terminal illness rather than an easily reversible acute cardio-pulmonary event. I explained that DNR/DNI does Mack change the medical plan and it only comes into effect after a person has arrested (died).  It is a protective measure to keep us  from harming the patient in their last moments  of life.   [name] asked me if she is dying and I shared no but at any point is important to identify if someone would or would Mack want cardiopulmonary resuscitation.  I did ask her to repeat back to me what we were talking about and unfortunately she was unable to do so indicating to me that she did Mack  understand the content provided.  Discussion:  I did share briefly the concerns associated with [name]'s current hospitalization.  I shared the gravity of how ill she was when she came in and the effects that this is likely to have on her in the short and long-term.  I reviewed with [name] her chronic progressive disease processes inclusive of her heart failure and COPD.  We reviewed the importance of considering quality of life with any and all decisions moving into the future.  Asked [name] if she had any other children that she could do for me to do to gain insights on her with which she was unable to do.   We discussed the reasons why [name] be eating and drinking she expresses to me that she wants to eat and drink but nobody is helping her to do so and she has physical limitations.  I shared that I would talk to the nursing staff to see if they might be able to help her more.  I did emphasize to [name] that she has been aggressive and this may cause a degree of fear and discomfort in someone trying to help her.  I shared that for further conversations it would be important that I speak to [name] daughter, [name] to gain better insights on goals of care moving into the future.  Discussed the importance of continued conversation with family and their  medical providers regarding overall plan of care and treatment options, ensuring decisions are within the context of the patients values and GOCs. _____________________ Addendum:  I have called patient's daughter, [name] 3 times and left a HIPAA compliant voicemail.  I have also sent her a text message for call back.  I spoken to the social work team regarding next steps. [name] plans to pursue a well check on patient's daughter.  Decision Maker: [name],[name]: Daughter, Emergency Contact: [phone] (Mobile)   SUMMARY OF RECOMMENDATIONS   Full code/full scope of care at this time  Appreciate MSW - [name] helping to get in touch with  patient daughter  Additional conversations are limited until we hear back from family  Delirium Precautions  The PMT will continue to try to make contact with family to assert goals moving forward  Code Status/Advance Care Planning: FULL CODE  Palliative Prophylaxis:  Aspiration, Bowel Regimen, Delirium Protocol, Frequent Pain Assessment, Oral Care, Palliative Wound Care, and Turn Reposition  Additional Recommendations (Limitations, Scope, Preferences): Continue current care  Psycho-social/Spiritual:  Desire for further Chaplaincy support: Yes Additional Recommendations: Education on chronic disease burden   Prognosis: If patient continues to decline eating and drinking her overall prognosis would be quite limited.  She has notably nutritional but also functional deficits and a high chronic comorbid condition burden.  She is at high risk for decline in the oncoming weeks to months.  Discharge Planning: Discharge plan to be determined.  Vitals:   04/30/24 0800 04/30/24 1200  BP: (!) 160/75 (!) 118/55  Pulse: 72 63  Resp: 18 19  Temp: 98 F (36.7 C) 97.6 F (36.4 C)  SpO2: 100%     Intake/Output Summary (Last 24 hours) at 04/30/2024  1527 Last data filed at 04/29/2024 1600 Gross per 24 hour  Intake --  Output 500 ml  Net -500 ml   Last Weight  Most recent update: 04/30/2024  6:18 AM    Weight  78.6 kg (173 lb 4.5 oz)            LABS: CBC:    Component Value Date/Time   WBC 2.1 (L) 04/30/2024 0849   HGB 10.1 (L) 04/30/2024 0849   HCT 30.6 (L) 04/30/2024 0849   PLT 62 (L) 04/30/2024 0849   MCV 93.9 04/30/2024 0849   NEUTROABS 0.6 (L) 04/30/2024 0849   LYMPHSABS 1.1 04/30/2024 0849   MONOABS 0.2 04/30/2024 0849   EOSABS 0.2 04/30/2024 0849   BASOSABS 0.0 04/30/2024 0849   Comprehensive Metabolic Panel:    Component Value Date/Time   NA 135 04/29/2024 0704   NA 142 05/30/2023 1423   K 4.6 04/29/2024 0704   CL 107 04/29/2024 0704   CO2 19 (L)  04/29/2024 0704   BUN 7 (L) 04/29/2024 0704   BUN 17 05/30/2023 1423   CREATININE 0.49 04/29/2024 0704   GLUCOSE 55 (L) 04/29/2024 0704   CALCIUM  8.2 (L) 04/29/2024 0704   AST 57 (H) 04/25/2024 0346   ALT 81 (H) 04/25/2024 0346   ALKPHOS 108 04/25/2024 0346   BILITOT 0.7 04/25/2024 0346   PROT 6.6 04/25/2024 0346   ALBUMIN  2.3 (L) 04/25/2024 0346   Gen: Elderly African-American female chronically ill in appearance HEENT: Dry mucous membranes, poor dentition CV: Regular rate and rhythm  PULM: On room air breathing is even and nonlabored ABD: soft/nontender  Neuro: Alert and oriented x2  PPS: 10%   This conversation/these recommendations were discussed with patient primary care team, Dr. Dennise ______________________________________________________ [facility] Palliative Medicine Team Team Cell Phone: [phone] Please utilize secure chat with additional questions, if there is no response within 30 minutes please call the above phone number  Total Time: 75 Billing based on MDM: High  Palliative Medicine Team providers are available by phone from 7am to 7pm daily and can be reached through the team cell phone.  Should this patient require assistance outside of these hours, please call the patient's attending physician.  "

## 2024-05-01 DIAGNOSIS — K7200 Acute and subacute hepatic failure without coma: Secondary | ICD-10-CM | POA: Diagnosis not present

## 2024-05-01 DIAGNOSIS — Z711 Person with feared health complaint in whom no diagnosis is made: Secondary | ICD-10-CM

## 2024-05-01 DIAGNOSIS — Z515 Encounter for palliative care: Secondary | ICD-10-CM | POA: Diagnosis not present

## 2024-05-01 DIAGNOSIS — Z7189 Other specified counseling: Secondary | ICD-10-CM | POA: Diagnosis not present

## 2024-05-01 LAB — ENA+DNA/DS+ANTICH+CENTRO+JO...
Anti JO-1: <0.2 AI (ref 0.0–0.9)
Centromere Ab Screen: <0.2 AI (ref 0.0–0.9)
Chromatin Ab SerPl-aCnc: <0.2 AI (ref 0.0–0.9)
ENA SM Ab Ser-aCnc: <0.2 AI (ref 0.0–0.9)
Ribonucleic Protein: 1.4 AI — ABNORMAL HIGH (ref 0.0–0.9)
SSA (Ro) (ENA) Antibody, IgG: 0.8 AI (ref 0.0–0.9)
SSB (La) (ENA) Antibody, IgG: <0.2 AI (ref 0.0–0.9)
Scleroderma (Scl-70) (ENA) Antibody, IgG: <0.2 AI (ref 0.0–0.9)
ds DNA Ab: 1 [IU]/mL (ref 0–9)

## 2024-05-01 LAB — GLUCOSE, CAPILLARY
Glucose-Capillary: 69 mg/dL — ABNORMAL LOW (ref 70–99)
Glucose-Capillary: 77 mg/dL (ref 70–99)
Glucose-Capillary: 80 mg/dL (ref 70–99)

## 2024-05-01 LAB — ANA W/REFLEX IF POSITIVE: Anti Nuclear Antibody (ANA): POSITIVE — AB
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
## 2024-05-01 NOTE — Progress Notes (Signed)
 PT Cancellation Note  Patient Details Name: [name] MRN: [id number] DOB: [birth year]   Cancelled Treatment:    Reason Eval/Treat Not Completed: Patient declined, no reason specified. Per discussion with RN, pt continuing to refuse all therapy and some medications. Pt has already missed x2 attempts with PT this week. Due to frequent refusals, PT will sign off at this time. Please re-consult if there is a medical change in status or patient is agreeable to participation.   Isaiah DEL. Carollyn Etcheverry, PT, DPT   [employer] 05/01/2024, 11:58 AM
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
## 2024-05-01 NOTE — Consult Note (Signed)
 [facility]  Patient Name: .[name]  MRN: [id number]  DOB: 09/17/1945  Consult Order details:  Orders (From admission, onward)     Start     Ordered   04/29/24 0849  IP CONSULT TO PSYCHIATRY       Ordering Provider: Dennise Lavada POUR, MD  Provider:  (Not yet assigned)  Question Answer Comment  Location [facility]   Reason for Consult? Refusal to eat, hitting staff, trying to bite the tech, question capacity to make decisions, no family.      04/29/24 0848            Mode of Visit: In person   Psychiatry Consult Evaluation  Service Date: May 01, 2024 LOS:  LOS: 18 days  Chief Complaint: Refusal to eat, hitting staff, trying to bite the tech, question capacity to make decisions, no family.  Primary Psychiatric Diagnoses  Delirium secondary to medication condition - with behavioral disturbance  Assessment  [name] is a 78 y.o. female admitted medically on 04/13/2024  8:19 PM after being found by her roommate unresponsive (no heat/light-under a single blanket)- she was apparently covered in feces/dried blood-found to be hypothermic/ hypotensive/ hypoglycemic- she was thought to have undifferentiated shock-acute liver failure-acute kidney injury. She has no significant past psychiatric history and has a past medical history of CVA, COPD, HTN.   Her current presentation of fluctuating mental status, intermittent agitation and combativeness in the setting of recent infection and current hospitalization is most consistent with delirium secondary to medical condition. She does not currently take any outpatient psychotropic medications. Primary team has started her on mirtazapine  for appetite stimulation.   On initial examination, patient is unresponsive to questioning. She stares blankly during the assessment. She does track me around the room with her eyes when prompted. She is not oriented to person, place, time, or situation.  Despite multiple prompts, patient is unable to state her first name. Asked the patient to wiggle her fingers, show a thumbs-up, but she does not follow any commands. Asked patient to identify common objects including a pen and cup. She was unable to identify the objects by name and was unable to demonstrate how these items are used. Overall, patient appears profoundly confused, most likely secondary to delirium. She does NOT demonstrate the ability to understand, comprehend, rationalize or communicate decisions per Applebaum criteria. At the time, patient LACKS decision-making capacity.  05/01/24: Patient seen at bedside resting in bed. She arouses to voice, but does not participate in the interview and is disoriented. Mental status continues to fluctuate daily. Continue current plan of care.   Please see plan below for detailed recommendations.   Diagnoses:  Active Hospital problems: Principal Problem:   Acute liver failure Active Problems:   Transaminitis   Aortic valve vegetation   Protein-calorie malnutrition, severe   Thrombocytopenia   Vegetation of heart valve   Pressure injury   SVT (supraventricular tachycardia)   Delirium    Plan   ## Psychiatric Medication Recommendations:  -- Continue Seroquel  25 mg BID for agitation -- Continue Mirtazapine  15 mg at bedtime for appetite stimulation -- Start Seroquel  12.5 mg PRN for agitation  ## Medical Decision Making Capacity: At this time, patient LACKS decision-making capacity. She appears delirious and fluctuates between being unresponsive to questions, disoriented to person, place, time, or situation. She was not able to follow commands,  recognize common objects or identify their purpose. She does NOT demonstrate the ability to understand, comprehend,  rationalize or communicate decisions.    ## Further Work-up:  -- Per primary team -- While pt on Qtc prolonging medications, please monitor & replete K+ to 4 and Mg2+ to 2 -- most  recent EKG on 04/25/24 had QtC of 406 -- Pertinent labwork reviewed earlier this admission includes: BMP, Mg, CBC, TSH, UA  ## Disposition:-- There are no psychiatric contraindications to discharge at this time  ## Behavioral / Environmental: -Delirium Precautions: Delirium Interventions for Nursing and Staff: - RN to open blinds every AM. - To Bedside: Glasses, hearing aide, and pt's own shoes. Make available to patients. when possible and encourage use. - Encourage po fluids when appropriate, keep fluids within reach. - OOB to chair with meals. - Passive ROM exercises to all extremities with AM & PM care. - RN to assess orientation to person, time and place QAM and PRN. - Recommend extended visitation hours with familiar family/friends as feasible. - Staff to minimize disturbances at night. Turn off television when pt asleep or when not in use.    ## Safety and Observation Level:  - Based on my clinical evaluation, I estimate the patient to be at low risk of self harm in the current setting. - At this time, we recommend  routine observation. This decision is based on my review of the chart including patient's history and current presentation, interview of the patient, mental status examination, and consideration of suicide risk including evaluating suicidal ideation, plan, intent, suicidal or self-harm behaviors, risk factors, and protective factors. This judgment is based on our ability to directly address suicide risk, implement suicide prevention strategies, and develop a safety plan while the patient is in the clinical setting. Please contact our team if there is a concern that risk level has changed.  CSSR Risk Category:C-SSRS RISK CATEGORY: No Risk  Suicide Risk Assessment: Patient has following modifiable risk factors for suicide: None Patient has following non-modifiable or demographic risk factors for suicide: None Patient has the following protective factors against suicide: Supportive  family, no history of suicide attempts, and no history of NSSIB  Thank you for this consult request. Recommendations have been communicated to the primary team. We will follow at this time.   Ashley LOISE Gravely, MD      History of Present Illness  Relevant Aspects of Hospital Course:  Admitted on 04/13/2024 after being found by her roommate unresponsive (no heat/light-under a single blanket)-she was apparently covered in feces/dried blood-found to be hypothermic/hypotensive/hypoglycemic-she was thought to have undifferentiated shock-acute liver failure-acute kidney injury-admitted to the ICU-stabilized with pressors/D10 infusion/broad-spectrum antibiotics-rapidly improved and subsequently transferred to [facility]. 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   Initial Patient Report: Unable to complete as patient in non-responsive to questions. She stares blankly at me and does not engage on interview. She is disoriented to person, place, time, and situation. She does not follow commands. She is not able to identify objects or state their purpose. Spoke with patient's nurse, who reports patient typically gets agitated and combative towards staff when someone is physically touching her in order to perform patient care or trying to feed her or give her meds. Otherwise, patient has been staring blankly.  Psych ROS:  Depression: No Anxiety:  No Mania (lifetime and current): No Psychosis: (lifetime and current): No  Review of Systems  Gastrointestinal:  Negative for abdominal pain, constipation, diarrhea, nausea and vomiting.  Neurological:  Negative for dizziness and headaches.  All other systems reviewed and are negative.   Psychiatric and Social History  Psychiatric History:  Information collected from chart review (patient seen on psych consult service  in 01/2024).  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 rate 16, height 5' 10 (1.778 m), weight 78.4 kg, SpO2 99%. Body mass index is 24.8 kg/m.  Physical Exam Vitals and nursing note reviewed.  Constitutional:      General: She is not in acute distress.    Appearance: Normal appearance. She is normal weight. She is not ill-appearing.  HENT:     Head: Normocephalic and atraumatic.  Pulmonary:     Effort: Pulmonary effort is normal. No respiratory distress.  Neurological:     Mental Status: She is alert and oriented to person, place, and time.    Mental Status Exam: General Appearance: Casual  Orientation:  Full (Time, Place, and Person)  Memory:  NA Immediate;   Poor Recent;   Fair Remote;   Fair  Concentration:  Concentration: Poor and Attention Span: Poor  Recall:  Poor  Attention  Poor  Eye Contact:  Fair  Speech:  Normal Rate  Language:  Fair  Volume:  Decreased  Mood: They think I'm crazy  Affect:  Appropriate  Thought Process:  Linear   Thought Content:  NA  Suicidal Thoughts:  No  Homicidal Thoughts:  No  Judgement:  Impaired  Insight:  Lacking  Psychomotor Activity:  Variable patient somnolent at times, but gets intermittently agitated  Akathisia:  No  Fund of Knowledge:  Fair    Assets:  Social Support  Cognition:  Impaired  ADL's:  Impaired  AIMS (if indicated):     Other History   These have been pulled in through the EMR, reviewed, and updated if appropriate.   Family History:  The patient's family history includes Hypertension in her mother.  Medical History: Past Medical History:  Diagnosis Date   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 (HCC) 09/16/2020   COPD (chronic obstructive pulmonary disease) (HCC)    History of DVT (deep vein thrombosis)    Hypertension    Thoracoabdominal aortic aneurysm    TIA (transient ischemic attack) 09/10/2020   Surgical History: Past Surgical History:  Procedure Laterality Date   IR BONE MARROW BIOPSY & ASPIRATION  12/13/2023   IR CT HEAD LTD  09/11/2020   IR INTRAVSC STENT CERV CAROTID W/O EMB-PROT MOD SED INC ANGIO  09/11/2020   IR PERCUTANEOUS ART THROMBECTOMY/INFUSION INTRACRANIAL INC DIAG ANGIO  09/11/2020       IR PERCUTANEOUS  ART THROMBECTOMY/INFUSION INTRACRANIAL INC DIAG ANGIO  09/11/2020   IR US  GUIDE VASC ACCESS RIGHT  09/11/2020   NO PAST SURGERIES     RADIOLOGY WITH ANESTHESIA N/A 09/11/2020   Procedure: IR WITH ANESTHESIA;  Surgeon: Dolphus Carrion, MD;  Location: [facility] OR;  Service: Radiology;  Laterality: N/A;   Medications:  Current Medications[1]  Allergies: Allergies[2]  Ashley LOISE Gravely, MD    [1]  Current Facility-Administered Medications:    amLODipine  (NORVASC ) tablet 10 mg, 10 mg, Oral, Daily, Singh, Prashant K, MD, 10 mg at 04/30/24 9192   cefTRIAXone  (ROCEPHIN ) 2 g in sodium chloride  0.9 % 100 mL IVPB, 2 g, Intravenous, Q1400, Dennise Kingsley, MD, Last Rate: 200 mL/hr at 04/30/24 1404, 2 g  at 04/30/24 1404   DAPTOmycin  (CUBICIN ) IVPB 700 mg/100mL premix, 700 mg, Intravenous, Q1400, Dennise Kingsley, MD, Last Rate: 200 mL/hr at 04/30/24 1440, 700 mg at 04/30/24 1440   dextrose  50 % solution 50 mL, 1 ampule, Intravenous, Q1H PRN, Howerter, Justin B, DO, 50 mL at 04/30/24 0806   docusate (COLACE) 50 MG/5ML liquid 100 mg, 100 mg, Oral, BID PRN, Alva, Rakesh V, MD   feeding supplement (ENSURE PLUS HIGH PROTEIN) liquid 237 mL, 237 mL, Oral, BID BM, Ghimire, Donalda HERO, MD, 237 mL at 04/29/24 9157   Gerhardt's butt cream, , Topical, BID, Ghimire, Donalda HERO, MD, Given at 05/01/24 1000   hydrALAZINE  (APRESOLINE ) injection 10 mg, 10 mg, Intravenous, Q6H PRN, Singh, Prashant K, MD   HYDROcodone -acetaminophen  (NORCO/VICODIN) 5-325 MG per tablet 1 tablet, 1 tablet, Oral, Q8H PRN, Dennise Lavada POUR, MD, 1 tablet at 04/30/24 0004   ipratropium-albuterol  (DUONEB) 0.5-2.5 (3) MG/3ML nebulizer solution 3 mL, 3 mL, Nebulization, Q6H PRN, Tobie, Amar, DO   lactulose  (CHRONULAC ) 10 GM/15ML solution 20 g, 20 g, Oral, TID, Alva, Rakesh V, MD, 20 g at 04/30/24 9192   melatonin tablet 3 mg, 3 mg, Oral, QHS PRN, Howerter, Justin B, DO, 3 mg at 04/29/24 0138   metoprolol  tartrate (LOPRESSOR ) injection 2.5 mg, 2.5 mg, Intravenous, Q4H PRN, Howerter, Justin B, DO, 2.5 mg at 04/27/24 2152   metoprolol  tartrate (LOPRESSOR ) tablet 12.5 mg, 12.5 mg, Oral, BID, Singh, Prashant K, MD, 12.5 mg at 04/30/24 9192   mirtazapine  (REMERON  SOL-TAB) disintegrating tablet 15 mg, 15 mg, Oral, QHS, Singh, Prashant K, MD, 15 mg at 04/29/24 2229   multivitamin with minerals tablet 1 tablet, 1 tablet, Oral, Daily, Ghimire, Donalda HERO, MD, 1 tablet at 04/30/24 9192   ondansetron  (ZOFRAN ) injection 4 mg, 4 mg, Intravenous, Q6H PRN, Paliwal, Aditya, MD   Oral care mouth rinse, 15 mL, Mouth Rinse, 4 times per day, Jude Harden GAILS, MD, 15 mL at 04/28/24 2128   Oral care mouth rinse, 15 mL, Mouth Rinse, PRN, Alva, Rakesh V, MD   pantoprazole   (PROTONIX ) EC tablet 40 mg, 40 mg, Oral, BID, Pham, Minh Q, RPH-CPP, 40 mg at 04/30/24 9193   polyethylene glycol (MIRALAX  / GLYCOLAX ) packet 17 g, 17 g, Oral, Daily PRN, Alva, Rakesh V, MD   QUEtiapine  (SEROQUEL ) tablet 12.5 mg, 12.5 mg, Oral, Daily PRN, Gravely Ashley LOISE, MD   QUEtiapine  (SEROQUEL ) tablet 25 mg, 25 mg, Oral, BID, Singh, Prashant K, MD, 25 mg at 04/30/24 9384   romiPLOStim  (NPLATE ) injection 245 mcg, 3 mcg/kg, Subcutaneous, Weekly, Rouson, Olam PARAS, NP, 245 mcg at 04/26/24 1322 [2]  Allergies Allergen Reactions   Iodinated Contrast Media Itching and Other (See Comments)    9/1 developed diffuse itching after contrast dye  for CT (without rash or airway involvement or GI symptoms) . Consider pretreatment before IV contrast in the future

## 2024-05-01 NOTE — Progress Notes (Signed)
 "        [facility]  Date of Admission:  04/13/2024    Principal Problem:   Acute liver failure Active Problems:   Transaminitis   Aortic valve vegetation   Protein-calorie malnutrition, severe   Thrombocytopenia   Vegetation of heart valve   Pressure injury   SVT (supraventricular tachycardia)   Delirium          Assessment: 78 year old female with prior history as below including CVA, HFpEF, COPD, HTN, DVT not on AC, AA who was initially brought to the ED on 12/6 after being found unresponsive by her roommate (no heat/light under a single blanket, she was apparently covered in feces/dried blood-found to be hypothermic, hypotensive and hypoglycemic). She was thought to have undifferentiated shock on arrival with acute liver failure, acute kidney injury and admitted to the ICU, required vasopressors, D10 infusion and broad-spectrum antibiotics with rapid improvement and then transferred to [facility] on 12/9. ID following for    # TTE with no vegetation versus thrombus in the AV/severe thickening of the aortic valve   # Encephalopathy -seems to have improved   # Shock liver/acute liver failure with colopathy/hyperbilirubinemia and transaminitis- - Evaluated by GI and most likely cause 2/2 hypotension with other possibilities considered include DILI in the setting of Tylenol  use, less likely CMV, ANA+.   Doppler negative for thrombosis.  Plan to complete course of NAC, vitamin K  - Liver enzymes improving   # Severe thrombocytopenia  - s/p 1 unit of pheresis on 12/10 - no schistocytes and not thought to have TTP or ITP per discussion of primary with hematology  - Acute hepatitis panel negative - Hematology consulted and plan for IVIG   # Bilateral buttock pressure injury- no signs of infection   Plan: -Continue Daptomycin  and ceftriaxone  to complete 6 weeks as unable to get consent  as family unreachable for TEE, low platelets and not ideal Tee candidate  unless life threatening per primary's discussion with Dr. Mona and Dr. Ren. Given her clinical presentation and tte findings could be Cx negative endocarditis. Although ANA + as such  NBTE is in the differential.  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    Labs weekly while on IV antibiotics: _x_ CBC with differential __ BMP **TWICE WEEKLY ON VANCOMYCIN   _x_ CMP _x_ CRP __ ESR __ Vancomycin  trough TWICE WEEKLY _x_ CK  __ Please pull PIC at completion of IV antibiotics x__ Please leave PIC in place until doctor has seen patient or been notified  Fax weekly labs to [phone]  Clinic Follow Up Appt: 05/24/24  @ [facility] with Dr. costella    Microbiology:   Antibiotics: 12/6-cefepime  12/6-7 piptaozo Vnaomcyin 12/6 12/9- dapto + ctx  Cultures: Blood 12/6 ng 12/9ng Urine  Other   SUBJECTIVE: Resting in bed. No new complaints. Undable to provide history Interval: Afebrile overngiht  Review of Systems: Review of Systems  All other systems reviewed and  are negative.    Scheduled Meds:  amLODipine   10 mg Oral Daily   feeding supplement  237 mL Oral BID BM   Gerhardt's butt cream   Topical BID   lactulose   20 g Oral TID   metoprolol  tartrate  12.5 mg Oral BID   mirtazapine   15 mg Oral QHS   multivitamin with minerals  1 tablet Oral Daily   mouth rinse  15 mL Mouth Rinse 4 times per day   pantoprazole   40 mg Oral BID   QUEtiapine   25 mg Oral BID    romiPLOStim   3 mcg/kg Subcutaneous Weekly   Continuous Infusions:  cefTRIAXone  (ROCEPHIN )  IV 2 g (04/30/24 1404)   DAPTOmycin  700 mg (04/30/24 1440)   PRN Meds:.dextrose , docusate, hydrALAZINE , HYDROcodone -acetaminophen , ipratropium-albuterol , melatonin, metoprolol  tartrate, ondansetron  (ZOFRAN ) IV, mouth rinse, polyethylene glycol, QUEtiapine  Allergies[2]  OBJECTIVE: Vitals:   04/30/24 2039 05/01/24 0014 05/01/24 0400 05/01/24 0500  BP: (!) 153/77 138/84 (!) 144/62   Pulse: 69 67 60   Resp:   16   Temp: 98.4 F (36.9 C) 98.1 F (36.7 C) 99 F (37.2 C)   TempSrc: Axillary Axillary Axillary   SpO2:      Weight:    78.4 kg  Height:       Body mass index is 24.8 kg/m.  Physical Exam Constitutional:      Appearance: Normal appearance.  HENT:     Head: Normocephalic and atraumatic.     Right Ear: Tympanic membrane normal.     Left Ear: Tympanic membrane normal.     Nose: Nose normal.     Mouth/Throat:     Mouth: Mucous membranes are moist.  Eyes:     Extraocular Movements: Extraocular movements intact.     Conjunctiva/sclera: Conjunctivae normal.     Pupils: Pupils are equal, round, and reactive to light.  Cardiovascular:     Rate and Rhythm: Normal rate and regular rhythm.     Heart sounds: No murmur heard.    No friction rub. No gallop.  Pulmonary:     Effort: Pulmonary effort is normal.     Breath sounds: Normal breath sounds.  Abdominal:     General: Abdomen is flat.     Palpations: Abdomen is soft.  Musculoskeletal:        General: Normal range of motion.  Skin:    General: Skin is warm and dry.  Neurological:     General: No focal deficit present.     Mental Status: She is alert and oriented to person, place, and time.  Psychiatric:        Mood and Affect: Mood normal.       Lab Results Lab Results  Component Value Date   WBC 2.1 (L) 04/30/2024   HGB 10.1 (L) 04/30/2024   HCT 30.6 (L) 04/30/2024   MCV 93.9 04/30/2024   PLT 62 (L) 04/30/2024     Lab Results  Component Value Date   CREATININE 0.49 04/29/2024   BUN 7 (L) 04/29/2024   NA 135 04/29/2024   K 4.6 04/29/2024   CL 107 04/29/2024   CO2 19 (L) 04/29/2024    Lab Results  Component Value Date   ALT 81 (H) 04/25/2024   AST 57 (H) 04/25/2024   ALKPHOS 108 04/25/2024   BILITOT 0.7 04/25/2024        Loney Stank, MD [facility] 05/01/2024, 7:19 AM Evaluation of this patient requires complex antimicrobial therapy  evaluation and counseling + isolation needs for disease transmission risk assessment and mitigation      [1]  Allergies Allergen Reactions   Iodinated Contrast Media Itching and Other (See Comments)    9/1 developed diffuse itching after contrast dye for CT (without rash or airway involvement or GI symptoms) . Consider pretreatment before IV contrast in the future   [2]  Allergies Allergen Reactions   Iodinated Contrast Media Itching and Other (See Comments)    9/1 developed diffuse itching after contrast dye for CT (without rash or airway involvement or GI symptoms) . Consider pretreatment before IV contrast in the future    "

## 2024-05-02 DIAGNOSIS — F05 Delirium due to known physiological condition: Secondary | ICD-10-CM | POA: Diagnosis not present

## 2024-05-02 DIAGNOSIS — K7200 Acute and subacute hepatic failure without coma: Secondary | ICD-10-CM | POA: Diagnosis not present

## 2024-05-02 LAB — CBC WITH DIFFERENTIAL/PLATELET
Abs Immature Granulocytes: 0.02 K/uL (ref 0.00–0.07)
Basophils Absolute: 0 K/uL (ref 0.0–0.1)
Basophils Relative: 1 %
Eosinophils Absolute: 0.2 K/uL (ref 0.0–0.5)
Eosinophils Relative: 9 %
HCT: 29.1 % — ABNORMAL LOW (ref 36.0–46.0)
Hemoglobin: 9.5 g/dL — ABNORMAL LOW (ref 12.0–15.0)
Immature Granulocytes: 1 %
Lymphocytes Relative: 43 %
Lymphs Abs: 1 K/uL (ref 0.7–4.0)
MCH: 31.3 pg (ref 26.0–34.0)
MCHC: 32.6 g/dL (ref 30.0–36.0)
MCV: 95.7 fL (ref 80.0–100.0)
Monocytes Absolute: 0.2 K/uL (ref 0.1–1.0)
Monocytes Relative: 8 %
Neutro Abs: 0.9 K/uL — ABNORMAL LOW (ref 1.7–7.7)
Neutrophils Relative %: 38 %
Platelets: 83 K/uL — ABNORMAL LOW (ref 150–400)
RBC: 3.04 MIL/uL — ABNORMAL LOW (ref 3.87–5.11)
RDW: 20.4 % — ABNORMAL HIGH (ref 11.5–15.5)
Smear Review: NORMAL
WBC: 2.3 K/uL — ABNORMAL LOW (ref 4.0–10.5)
nRBC: 0.9 % — ABNORMAL HIGH (ref 0.0–0.2)

## 2024-05-02 LAB — BASIC METABOLIC PANEL WITH GFR
Anion gap: 8 (ref 5–15)
BUN: 11 mg/dL (ref 8–23)
CO2: 22 mmol/L (ref 22–32)
Calcium: 8.1 mg/dL — ABNORMAL LOW (ref 8.9–10.3)
Chloride: 106 mmol/L (ref 98–111)
Creatinine, Ser: 0.5 mg/dL (ref 0.44–1.00)
GFR, Estimated: >60 mL/min
Glucose, Bld: 83 mg/dL (ref 70–99)
Potassium: 3.6 mmol/L (ref 3.5–5.1)
Sodium: 135 mmol/L (ref 135–145)

## 2024-05-02 LAB — GLUCOSE, CAPILLARY
Glucose-Capillary: 178 mg/dL — ABNORMAL HIGH (ref 70–99)
Glucose-Capillary: 90 mg/dL (ref 70–99)
Glucose-Capillary: 180 mg/dL — ABNORMAL HIGH (ref 70–99)

## 2024-05-02 LAB — CK: Total CK: 220 U/L (ref 38–234)

## 2024-05-02 MED ORDER — DIPHENHYDRAMINE HCL 25 MG PO CAPS
25.0000 mg | ORAL_CAPSULE | Freq: Four times a day (QID) | ORAL | Status: DC | PRN
  Administered 2024-05-02 – 2024-05-08 (×4): 25 mg via ORAL
  Filled 2024-05-02 (×3): qty 1

## 2024-05-02 NOTE — Consult Note (Signed)
 [facility] Consult Follow-up  Patient Name: .[name]  MRN: [id number]  DOB: [birth year]  Consult Order details:  Orders (From admission, onward)     Start     Ordered   04/29/24 0849  IP CONSULT TO PSYCHIATRY       Ordering Provider: Dennise Lavada POUR, MD  Provider:  (Not yet assigned)  Question Answer Comment  Location [facility]   Reason for Consult? Refusal to eat, hitting staff, trying to bite the tech, question capacity to make decisions, no family.      04/29/24 0848            Mode of Visit: In person   Psychiatry Consult Evaluation  Service Date: May 02, 2024 LOS:  LOS: 19 days  Chief Complaint: Refusal to eat, hitting staff, trying to bite the tech, question capacity to make decisions, no family.  Primary Psychiatric Diagnoses  Delirium secondary to medication condition - with behavioral disturbance  Assessment  [name] is a 78 y.o. female admitted medically on 04/13/2024  8:19 PM after being found by her roommate unresponsive (no heat/light-under a single blanket)- she was apparently covered in feces/dried blood-found to be hypothermic/ hypotensive/ hypoglycemic- she was thought to have undifferentiated shock-acute liver failure-acute kidney injury. She has no significant past psychiatric history and has a past medical history of CVA, COPD, HTN.   Her current presentation of fluctuating mental status, intermittent agitation and combativeness in the setting of recent infection and current hospitalization is most consistent with delirium secondary to medical condition. She does not currently take any outpatient psychotropic medications. Primary team has started her on mirtazapine  for appetite stimulation.   On initial examination, patient is unresponsive to questioning. She stares blankly during the assessment. She does track me around the room with her eyes when prompted. She is not oriented to person, place, time, or situation.  Despite multiple prompts, patient is unable to state her first name. Asked the patient to wiggle her fingers, show a thumbs-up, but she does not follow any commands. Asked patient to identify common objects including a pen and cup. She was unable to identify the objects by name and was unable to demonstrate how these items are used. Overall, patient appears profoundly confused, most likely secondary to delirium. She does NOT demonstrate the ability to understand, comprehend, rationalize or communicate decisions per Applebaum criteria. At the time, patient LACKS decision-making capacity.  05/01/24: Patient seen at bedside resting in bed. She arouses to voice, but does not participate in the interview and is disoriented. Mental status continues to fluctuate daily. Continue current plan of care.   05/02/24: The client was alert and able to participate in the assessment with increase volume as she is HOH per the nurse at the bedside who reported she is doing well and ate her whole magic cup this am.  The client was calmly watching tv and stated she was fine, thank you.  Her sleep was pretty good.  Denied depression, suicidal ideations, side effects from her medications, and hallucinations.  The RN denies she is responding to any internal stimuli also along with no agitation.  When asked if the patient needed anything, she stated, No, I'm fine, thank you.    Please see plan below for detailed recommendations.   Diagnoses:  Active Hospital problems: Principal Problem:   Acute liver failure Active Problems:   Transaminitis   Aortic valve vegetation   Protein-calorie malnutrition, severe   Thrombocytopenia   Vegetation of  heart valve   Pressure injury   SVT (supraventricular tachycardia)   Delirium    Plan   ## Psychiatric Medication Recommendations:  -- Continue Seroquel  25 mg BID for agitation -- Continue Mirtazapine  15 mg at bedtime for appetite stimulation -- Start Seroquel  12.5 mg PRN  for agitation  ## Medical Decision Making Capacity: At this time, patient LACKS decision-making capacity. She appears delirious and fluctuates between being unresponsive to questions, disoriented to person, place, time, or situation. She was not able to follow commands,  recognize common objects or identify their purpose. She does NOT demonstrate the ability to understand, comprehend, rationalize or communicate decisions.    ## Further Work-up:  -- Per primary team -- While pt on Qtc prolonging medications, please monitor & replete K+ to 4 and Mg2+ to 2 -- most recent EKG on 04/25/24 had QtC of 406 -- Pertinent labwork reviewed earlier this admission includes: BMP, Mg, CBC, TSH, UA  ## Disposition:-- There are no psychiatric contraindications to discharge at this time  ## Behavioral / Environmental: -Delirium Precautions: Delirium Interventions for Nursing and Staff: - RN to open blinds every AM. - To Bedside: Glasses, hearing aide, and pt's own shoes. Make available to patients. when possible and encourage use. - Encourage po fluids when appropriate, keep fluids within reach. - OOB to chair with meals. - Passive ROM exercises to all extremities with AM & PM care. - RN to assess orientation to person, time and place QAM and PRN. - Recommend extended visitation hours with familiar family/friends as feasible. - Staff to minimize disturbances at night. Turn off television when pt asleep or when not in use.    ## Safety and Observation Level:  - Based on my clinical evaluation, I estimate the patient to be at low risk of self harm in the current setting. - At this time, we recommend  routine observation. This decision is based on my review of the chart including patient's history and current presentation, interview of the patient, mental status examination, and consideration of suicide risk including evaluating suicidal ideation, plan, intent, suicidal or self-harm behaviors, risk factors, and  protective factors. This judgment is based on our ability to directly address suicide risk, implement suicide prevention strategies, and develop a safety plan while the patient is in the clinical setting. Please contact our team if there is a concern that risk level has changed.  CSSR Risk Category:C-SSRS RISK CATEGORY: No Risk  Suicide Risk Assessment: Patient has following modifiable risk factors for suicide: None Patient has following non-modifiable or demographic risk factors for suicide: None Patient has the following protective factors against suicide: Supportive family, no history of suicide attempts, and no history of NSSIB  Thank you for this consult request. Recommendations have been communicated to the primary team. We will sign off at this time.   Sharlot Becker, NP      History of Present Illness  Relevant Aspects of Hospital Course:  Admitted on 04/13/2024 after being found by her roommate unresponsive (no heat/light-under a single blanket)-she was apparently covered in feces/dried blood-found to be hypothermic/hypotensive/hypoglycemic-she was thought to have undifferentiated shock-acute liver failure-acute kidney injury-admitted to the ICU-stabilized with pressors/D10 infusion/broad-spectrum antibiotics-rapidly improved and subsequently transferred to [facility]. 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   Initial Patient Report: Unable to complete as patient in non-responsive to questions. She stares blankly at me and does not engage on  interview. She is disoriented to person, place, time, and situation. She does not follow commands. She is not able to identify objects or state their purpose. Spoke with patient's nurse, who reports patient typically gets agitated and combative towards staff when someone is physically touching her in order to  perform patient care or trying to feed her or give her meds. Otherwise, patient has been staring blankly.  05/02/24: The client was alert and able to participate in the assessment with increase volume as she is HOH per the nurse at the bedside who reported she is doing well and ate her whole magic cup this am.  The client was calmly watching tv and stated she was fine, thank you.  Her sleep was pretty good.  Denied depression, suicidal ideations, side effects from her medications, and hallucinations.  The RN denies she is responding to any internal stimuli also along with no agitation.  When asked if the patient needed anything, she stated, No, I'm fine, thank you.    Psych ROS:  Depression: No Anxiety:  No Mania (lifetime and current): No Psychosis: (lifetime and current): No  Review of Systems  Gastrointestinal:  Negative for abdominal pain, constipation, diarrhea, nausea and vomiting.  Neurological:  Negative for dizziness and headaches.  All other systems reviewed and are negative.   Psychiatric and Social History  Psychiatric History:  Information collected from chart review (patient seen on psych consult service in 01/2024).  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 rate 19, height 5' 10 (1.778 m), weight 79.2 kg, SpO2 94%. Body mass index is 25.05 kg/m.  Physical Exam Vitals and nursing note reviewed.  Constitutional:      General: She is not in acute distress.    Appearance: Normal appearance. She is normal weight. She is not ill-appearing.  HENT:     Head: Normocephalic and atraumatic.  Pulmonary:     Effort: Pulmonary effort is normal. No respiratory distress.  Neurological:     Mental Status: She is alert and oriented to person, place, and time.    Mental Status Exam: General Appearance: Casual  Orientation:  Full (Time, Place, and Person)  Memory:  NA Immediate;   Poor Recent;   Fair Remote;   Fair  Concentration:  Good  Recall:  Fair  Attention  Good  Eye Contact:  Good  Speech:  Normal Rate  Language:  Fair  Volume:  WDL  Mood: denied depression and anxiety  Affect:  Appropriate  Thought Process:  Linear  Thought Content:  NA  Suicidal Thoughts:  No  Homicidal Thoughts:  No  Judgement:  Impaired  Insight:  Lacking  Psychomotor Activity: calm and cooperative  Akathisia:  No  Fund of Knowledge:  Fair    Assets:  Social Support  Cognition:  Impaired  ADL's:  Impaired  AIMS (if indicated):     Other History   These have been pulled in through the EMR, reviewed, and updated if appropriate.   Family History:  The patient's family history includes Hypertension in her mother.  Medical History: Past Medical History:  Diagnosis Date   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 (HCC) 09/16/2020   COPD (chronic obstructive pulmonary disease) (HCC)    History of DVT (deep vein thrombosis)    Hypertension    Thoracoabdominal aortic aneurysm    TIA  (transient ischemic attack) 09/10/2020   Surgical History: Past Surgical History:  Procedure Laterality Date   IR BONE MARROW BIOPSY & ASPIRATION  12/13/2023   IR CT HEAD LTD  09/11/2020   IR INTRAVSC STENT CERV CAROTID W/O EMB-PROT MOD SED INC ANGIO  09/11/2020   IR PERCUTANEOUS ART THROMBECTOMY/INFUSION INTRACRANIAL INC DIAG ANGIO  09/11/2020       IR PERCUTANEOUS ART THROMBECTOMY/INFUSION INTRACRANIAL INC DIAG ANGIO  09/11/2020   IR US  GUIDE VASC ACCESS RIGHT  09/11/2020   NO PAST SURGERIES     RADIOLOGY WITH ANESTHESIA N/A 09/11/2020   Procedure: IR WITH ANESTHESIA;  Surgeon: Dolphus Carrion, MD;  Location: [facility] OR;  Service: Radiology;  Laterality: N/A;   Medications:  Current Medications[1]  Allergies: Allergies[2]  Sharlot Becker, NP      [1]  Current Facility-Administered Medications:    amLODipine  (NORVASC ) tablet 10 mg, 10 mg, Oral, Daily, Singh, Prashant K, MD, 10 mg at 05/02/24 1016   cefTRIAXone  (ROCEPHIN ) 2 g in sodium chloride  0.9 % 100 mL IVPB, 2 g, Intravenous, Q1400, Dennise Kingsley, MD, Last Rate: 200 mL/hr at 05/01/24 1440, 2 g at 05/01/24 1440   DAPTOmycin  (CUBICIN ) IVPB 700 mg/100mL premix, 700 mg, Intravenous, Q1400, Dennise Kingsley, MD, Last Rate: 200 mL/hr at 05/01/24 1520, 700 mg at 05/01/24 1520   dextrose  50 % solution 50 mL, 1 ampule, Intravenous, Q1H PRN, Howerter, Justin B, DO, 50 mL at 04/30/24 0806   docusate (COLACE) 50 MG/5ML liquid 100 mg, 100 mg, Oral, BID PRN, Alva, Rakesh V, MD   feeding supplement (ENSURE PLUS HIGH PROTEIN) liquid 237 mL, 237 mL, Oral, BID BM, Ghimire, Donalda HERO, MD, 237 mL at 05/02/24 1016   Gerhardt's butt cream, , Topical, BID, Ghimire, Donalda HERO, MD, Given at 05/02/24 1016   hydrALAZINE  (APRESOLINE ) injection 10 mg, 10 mg, Intravenous, Q6H PRN, Singh, Prashant K, MD   HYDROcodone -acetaminophen  (NORCO/VICODIN) 5-325 MG per tablet 1 tablet, 1 tablet, Oral, Q8H PRN, Dennise Lavada POUR, MD, 1 tablet at 05/02/24 9473   ipratropium-albuterol   (DUONEB) 0.5-2.5 (3) MG/3ML nebulizer solution 3 mL, 3 mL, Nebulization, Q6H PRN, Tobie, Amar, DO   lactulose  (CHRONULAC ) 10 GM/15ML solution 20 g, 20 g, Oral, TID, Alva, Rakesh V, MD, 20 g at 05/02/24 1015   melatonin tablet 3 mg, 3 mg, Oral, QHS PRN, Howerter, Justin B, DO, 3 mg at 04/29/24 0138   metoprolol  tartrate (LOPRESSOR ) injection 2.5 mg, 2.5 mg, Intravenous, Q4H PRN, Howerter, Justin B, DO, 2.5 mg at 04/27/24 2152   metoprolol  tartrate (LOPRESSOR ) tablet 12.5 mg, 12.5 mg, Oral, BID, Singh, Prashant K, MD, 12.5 mg at 05/02/24 1016   mirtazapine  (REMERON  SOL-TAB) disintegrating tablet 15 mg, 15 mg, Oral, QHS, Singh, Prashant K, MD,  15 mg at 05/01/24 2113   multivitamin with minerals tablet 1 tablet, 1 tablet, Oral, Daily, Ghimire, Donalda HERO, MD, 1 tablet at 05/02/24 1015   ondansetron  (ZOFRAN ) injection 4 mg, 4 mg, Intravenous, Q6H PRN, Paliwal, Aditya, MD   Oral care mouth rinse, 15 mL, Mouth Rinse, 4 times per day, Jude Harden GAILS, MD, 15 mL at 05/02/24 1016   Oral care mouth rinse, 15 mL, Mouth Rinse, PRN, Jude Harden GAILS, MD   pantoprazole  (PROTONIX ) EC tablet 40 mg, 40 mg, Oral, BID, Pham, Minh Q, RPH-CPP, 40 mg at 05/02/24 1016   polyethylene glycol (MIRALAX  / GLYCOLAX ) packet 17 g, 17 g, Oral, Daily PRN, Alva, Rakesh V, MD   QUEtiapine  (SEROQUEL ) tablet 12.5 mg, 12.5 mg, Oral, Daily PRN, Mannie Ashley SAILOR, MD   QUEtiapine  (SEROQUEL ) tablet 25 mg, 25 mg, Oral, BID, Singh, Prashant K, MD, 25 mg at 05/02/24 1015   romiPLOStim  (NPLATE ) injection 245 mcg, 3 mcg/kg, Subcutaneous, Weekly, Rouson, Olam PARAS, NP, 245 mcg at 04/26/24 1322 [2]  Allergies Allergen Reactions   Iodinated Contrast Media Itching and Other (See Comments)    9/1 developed diffuse itching after contrast dye for CT (without rash or airway involvement or GI symptoms) . Consider pretreatment before IV contrast in the future

## 2024-05-02 NOTE — TOC Progression Note (Signed)
 Transition of Care [facility]) - Progression Note    Patient Details  Name: [name] MRN: [id number] Date of Birth: 1945-09-26  [facility] CM/SW Contact  [name], LCSW Phone Number: 05/02/2024, 12:42 PM  Clinical Narrative:    Continuing to follow.   Expected Discharge Plan: Skilled Nursing Facility Barriers to Discharge: Continued Medical Work up               Expected Discharge Plan and Services In-house Referral: Clinical Social Work   Post Acute Care Choice: Skilled Nursing Facility Living arrangements for the past 2 months: Single Family Home                                       Social Drivers of Health (SDOH) Interventions SDOH Screenings   Food Insecurity: Food Insecurity Present (04/15/2024)  Housing: High Risk (04/22/2024)  Transportation Needs: Unmet Transportation Needs (04/15/2024)  Utilities: At Risk (04/15/2024)  Depression (PHQ2-9): Low Risk (06/04/2021)  Social Connections: Unknown (04/15/2024)  Tobacco Use: Medium Risk (03/22/2024)    Readmission Risk Interventions    01/15/2024    2:24 PM  Readmission Risk Prevention Plan  Transportation Screening Complete  HRI or Home Care Consult Complete  Social Work Consult for Recovery Care Planning/Counseling Complete  Palliative Care Screening Not Applicable  Medication Review Oceanographer) Complete

## 2024-05-02 NOTE — Progress Notes (Signed)
 Patient ate both magic cups for breakfast and lunch today- no BM this shift

## 2024-05-02 NOTE — Plan of Care (Signed)
°  Patient is progressing towards goals of care    Problem: Education: Goal: Knowledge of General Education information will improve Description: Including pain rating scale, medication(s)/side effects and non-pharmacologic comfort measures Outcome: Progressing   Problem: Health Behavior/Discharge Planning: Goal: Ability to manage health-related needs will improve Outcome: Progressing   Problem: Clinical Measurements: Goal: Ability to maintain clinical measurements within normal limits will improve Outcome: Progressing Goal: Will remain free from infection Outcome: Progressing Goal: Diagnostic test results will improve Outcome: Progressing Goal: Respiratory complications will improve Outcome: Progressing Goal: Cardiovascular complication will be avoided Outcome: Progressing   Problem: Activity: Goal: Risk for activity intolerance will decrease Outcome: Progressing   Problem: Nutrition: Goal: Adequate nutrition will be maintained Outcome: Progressing   Problem: Coping: Goal: Level of anxiety will decrease Outcome: Progressing   Problem: Elimination: Goal: Will not experience complications related to bowel motility Outcome: Progressing Goal: Will not experience complications related to urinary retention Outcome: Progressing   Problem: Pain Managment: Goal: General experience of comfort will improve and/or be controlled Outcome: Progressing   Problem: Safety: Goal: Ability to remain free from injury will improve Outcome: Progressing   Problem: Skin Integrity: Goal: Risk for impaired skin integrity will decrease Outcome: Progressing

## 2024-05-02 NOTE — Progress Notes (Signed)
" °   05/02/24 0800  Level of Consciousness  Level of Consciousness Alert  MEWS COLOR  MEWS Score Color Yellow  PCA/Epidural/Spinal Assessment  Respiratory Pattern Regular;Unlabored;Symmetrical  ECG Monitoring  PR interval 0.16  QRS interval 0.09  QT interval 0.43  QTc interval 0.43  CV Strip Heart Rate 57  Cardiac Rhythm SB  Glasgow Coma Scale  Eye Opening 3  Best Verbal Response (NON-intubated) 4  Best Motor Response 6  Glasgow Coma Scale Score 13  MEWS Score  MEWS Temp 1  MEWS Systolic 1  MEWS Pulse 0  MEWS RR 0  MEWS LOC 0  MEWS Score 2   Vitals retaken "
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
## 2024-05-03 DIAGNOSIS — K7200 Acute and subacute hepatic failure without coma: Secondary | ICD-10-CM | POA: Diagnosis not present

## 2024-05-03 LAB — CBC WITH DIFFERENTIAL/PLATELET
Abs Immature Granulocytes: 0.03 K/uL (ref 0.00–0.07)
Basophils Absolute: 0 K/uL (ref 0.0–0.1)
Basophils Relative: 1 %
Eosinophils Absolute: 0.2 K/uL (ref 0.0–0.5)
Eosinophils Relative: 7 %
HCT: 28.4 % — ABNORMAL LOW (ref 36.0–46.0)
Hemoglobin: 9.3 g/dL — ABNORMAL LOW (ref 12.0–15.0)
Immature Granulocytes: 1 %
Lymphocytes Relative: 35 %
Lymphs Abs: 0.9 K/uL (ref 0.7–4.0)
MCH: 32 pg (ref 26.0–34.0)
MCHC: 32.7 g/dL (ref 30.0–36.0)
MCV: 97.6 fL (ref 80.0–100.0)
Monocytes Absolute: 0.3 K/uL (ref 0.1–1.0)
Monocytes Relative: 13 %
Neutro Abs: 1.2 K/uL — ABNORMAL LOW (ref 1.7–7.7)
Neutrophils Relative %: 43 %
Platelets: 97 K/uL — ABNORMAL LOW (ref 150–400)
RBC: 2.91 MIL/uL — ABNORMAL LOW (ref 3.87–5.11)
RDW: 19.9 % — ABNORMAL HIGH (ref 11.5–15.5)
Smear Review: NORMAL
WBC: 2.7 K/uL — ABNORMAL LOW (ref 4.0–10.5)
nRBC: 0 % (ref 0.0–0.2)

## 2024-05-03 LAB — GLUCOSE, CAPILLARY
Glucose-Capillary: 61 mg/dL — ABNORMAL LOW (ref 70–99)
Glucose-Capillary: 79 mg/dL (ref 70–99)
Glucose-Capillary: 82 mg/dL (ref 70–99)
Glucose-Capillary: 86 mg/dL (ref 70–99)
Glucose-Capillary: 91 mg/dL (ref 70–99)
Glucose-Capillary: 99 mg/dL (ref 70–99)

## 2024-05-03 MED ORDER — LACTULOSE 10 GM/15ML PO SOLN
20.0000 g | Freq: Two times a day (BID) | ORAL | Status: AC
  Administered 2024-05-03 – 2024-06-14 (×50): 20 g via ORAL
  Filled 2024-05-03 (×60): qty 30

## 2024-05-03 MED ORDER — ENSURE PLUS HIGH PROTEIN PO LIQD
237.0000 mL | Freq: Three times a day (TID) | ORAL | Status: AC
  Administered 2024-05-03 – 2024-06-14 (×90): 237 mL via ORAL
  Filled 2024-05-03: qty 237

## 2024-05-03 MED ORDER — MIRTAZAPINE 15 MG PO TBDP
30.0000 mg | ORAL_TABLET | Freq: Every day | ORAL | Status: AC
  Administered 2024-05-03 – 2024-06-14 (×42): 30 mg via ORAL
  Filled 2024-05-03 (×36): qty 2

## 2024-05-03 NOTE — Progress Notes (Signed)
 Patient is currently refusing labs, breakfast, and morning medications- provided education patient still refused- notified provider
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
## 2024-05-03 NOTE — Plan of Care (Signed)
°  Patient is progressing towards goals of care    Problem: Education: Goal: Knowledge of General Education information will improve Description: Including pain rating scale, medication(s)/side effects and non-pharmacologic comfort measures Outcome: Progressing   Problem: Health Behavior/Discharge Planning: Goal: Ability to manage health-related needs will improve Outcome: Progressing   Problem: Clinical Measurements: Goal: Ability to maintain clinical measurements within normal limits will improve Outcome: Progressing Goal: Will remain free from infection Outcome: Progressing Goal: Diagnostic test results will improve Outcome: Progressing Goal: Respiratory complications will improve Outcome: Progressing Goal: Cardiovascular complication will be avoided Outcome: Progressing   Problem: Activity: Goal: Risk for activity intolerance will decrease Outcome: Progressing   Problem: Nutrition: Goal: Adequate nutrition will be maintained Outcome: Progressing   Problem: Coping: Goal: Level of anxiety will decrease Outcome: Progressing   Problem: Elimination: Goal: Will not experience complications related to bowel motility Outcome: Progressing Goal: Will not experience complications related to urinary retention Outcome: Progressing   Problem: Pain Managment: Goal: General experience of comfort will improve and/or be controlled Outcome: Progressing   Problem: Safety: Goal: Ability to remain free from injury will improve Outcome: Progressing   Problem: Skin Integrity: Goal: Risk for impaired skin integrity will decrease Outcome: Progressing
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
## 2024-05-03 NOTE — Progress Notes (Signed)
 PICC risks and benefits discussed with son, Auston. Questions answered. Telephone consent obtained.

## 2024-05-03 NOTE — Plan of Care (Signed)
   Problem: Education: Goal: Knowledge of General Education information will improve Description: Including pain rating scale, medication(s)/side effects and non-pharmacologic comfort measures Outcome: Progressing   Problem: Clinical Measurements: Goal: Ability to maintain clinical measurements within normal limits will improve Outcome: Progressing Goal: Diagnostic test results will improve Outcome: Progressing   Problem: Activity: Goal: Risk for activity intolerance will decrease Outcome: Progressing   Problem: Coping: Goal: Level of anxiety will decrease Outcome: Progressing   Problem: Safety: Goal: Ability to remain free from injury will improve Outcome: Progressing

## 2024-05-03 NOTE — Progress Notes (Signed)
 Pharmacy Antibiotic Note  [name] is a 78 y.o. female admitted on 04/13/2024 with concern for AV vegetation vs thrombus.  Pharmacy has been consulted for Daptomycin  + Rocephin  dosing. TEE currently precluded by thrombocytopenia.   CK 220 on 12/25, last CK 86 on 12/17 , CrCl>30 ml/min.   Plan: - Cont Daptomycin  700 mg IV every 24 hours - Cont Rocephin  2g IV every 24 hours - weekly CK - Will monitor renal function while inpatient -OPAT orders are placed for end of therapy 05/28/2024  Height: 5' 10 (177.8 cm) Weight: 79.3 kg (174 lb 13.2 oz) IBW/kg (Calculated) : 68.5  Temp (24hrs), Avg:99.1 F (37.3 C), Min:98.8 F (37.1 C), Max:99.5 F (37.5 C)  Recent Labs  Lab 04/27/24 0456 04/28/24 0413 04/29/24 0704 04/30/24 0849 05/02/24 0633  WBC 2.6* 2.8* 2.9* 2.1* 2.3*  CREATININE 0.45 0.50 0.49  --  0.50    Estimated Creatinine Clearance: 60.7 mL/min (by C-G formula based on SCr of 0.5 mg/dL).    Allergies  Allergen Reactions   Iodinated Contrast Media Itching and Other (See Comments)    9/1 developed diffuse itching after contrast dye for CT (without rash or airway involvement or GI symptoms) . Consider pretreatment before IV contrast in the future     Antimicrobials this admission: Vancomycin  12/6 x 1 Flagyl  12/6 x 1 Cefepime  12/6 x 1 Zosyn  12/7 >> 12/8 Rocephin  12/9 >> Daptomycin  12/9 >>   Microbiology results: 12/6 COVD/flu/RSV >> neg 12/6 MRSA PCR neg 12/6 BCX>>ngF 12/9 bcx >>ngF  Thank you for allowing pharmacy to be involved with this patient's care.  Mendel Barter, PharmD PGY1 Clinical Pharmacist [facility]  05/03/2024 8:31 AM

## 2024-05-04 DIAGNOSIS — K7200 Acute and subacute hepatic failure without coma: Secondary | ICD-10-CM | POA: Diagnosis not present

## 2024-05-04 LAB — BASIC METABOLIC PANEL WITH GFR
Anion gap: 8 (ref 5–15)
BUN: 17 mg/dL (ref 8–23)
CO2: 21 mmol/L — ABNORMAL LOW (ref 22–32)
Calcium: 7.9 mg/dL — ABNORMAL LOW (ref 8.9–10.3)
Chloride: 106 mmol/L (ref 98–111)
Creatinine, Ser: 0.46 mg/dL (ref 0.44–1.00)
GFR, Estimated: >60 mL/min
Glucose, Bld: 69 mg/dL — ABNORMAL LOW (ref 70–99)
Potassium: 3.3 mmol/L — ABNORMAL LOW (ref 3.5–5.1)
Sodium: 136 mmol/L (ref 135–145)

## 2024-05-04 LAB — CBC WITH DIFFERENTIAL/PLATELET
Abs Immature Granulocytes: 0.01 K/uL (ref 0.00–0.07)
Basophils Absolute: 0 K/uL (ref 0.0–0.1)
Basophils Relative: 1 %
Eosinophils Absolute: 0.2 K/uL (ref 0.0–0.5)
Eosinophils Relative: 9 %
HCT: 27.4 % — ABNORMAL LOW (ref 36.0–46.0)
Hemoglobin: 9.3 g/dL — ABNORMAL LOW (ref 12.0–15.0)
Immature Granulocytes: 0 %
Lymphocytes Relative: 34 %
Lymphs Abs: 0.8 K/uL (ref 0.7–4.0)
MCH: 32 pg (ref 26.0–34.0)
MCHC: 33.9 g/dL (ref 30.0–36.0)
MCV: 94.2 fL (ref 80.0–100.0)
Monocytes Absolute: 0.3 K/uL (ref 0.1–1.0)
Monocytes Relative: 13 %
Neutro Abs: 1.1 K/uL — ABNORMAL LOW (ref 1.7–7.7)
Neutrophils Relative %: 43 %
Platelets: 123 K/uL — ABNORMAL LOW (ref 150–400)
RBC: 2.91 MIL/uL — ABNORMAL LOW (ref 3.87–5.11)
RDW: 19.4 % — ABNORMAL HIGH (ref 11.5–15.5)
WBC: 2.5 K/uL — ABNORMAL LOW (ref 4.0–10.5)
nRBC: 0 % (ref 0.0–0.2)

## 2024-05-04 LAB — GLUCOSE, CAPILLARY
Glucose-Capillary: 100 mg/dL — ABNORMAL HIGH (ref 70–99)
Glucose-Capillary: 104 mg/dL — ABNORMAL HIGH (ref 70–99)
Glucose-Capillary: 139 mg/dL — ABNORMAL HIGH (ref 70–99)
Glucose-Capillary: 68 mg/dL — ABNORMAL LOW (ref 70–99)
Glucose-Capillary: 78 mg/dL (ref 70–99)
Glucose-Capillary: 78 mg/dL (ref 70–99)
Glucose-Capillary: 99 mg/dL (ref 70–99)

## 2024-05-04 MED ORDER — POTASSIUM CHLORIDE CRYS ER 20 MEQ PO TBCR
40.0000 meq | EXTENDED_RELEASE_TABLET | Freq: Four times a day (QID) | ORAL | Status: AC
  Administered 2024-05-04 (×2): 40 meq via ORAL
  Filled 2024-05-04 (×2): qty 2

## 2024-05-04 MED ORDER — SODIUM CHLORIDE 0.9% FLUSH
10.0000 mL | Freq: Two times a day (BID) | INTRAVENOUS | Status: AC
  Administered 2024-05-04 – 2024-05-05 (×4): 10 mL
  Administered 2024-05-06: 20 mL
  Administered 2024-05-06 – 2024-05-08 (×3): 10 mL
  Administered 2024-05-08: 20 mL
  Administered 2024-05-09 – 2024-05-18 (×20): 10 mL
  Administered 2024-05-19: 20 mL
  Administered 2024-05-19 – 2024-05-20 (×3): 10 mL
  Administered 2024-05-21: 20 mL
  Administered 2024-05-21 – 2024-05-28 (×14): 10 mL
  Administered 2024-05-28: 20 mL
  Administered 2024-05-29 – 2024-06-12 (×29): 10 mL

## 2024-05-04 MED ORDER — HALOPERIDOL LACTATE 5 MG/ML IJ SOLN: 2.0000 mg | Freq: Once | INTRAMUSCULAR | Status: DC

## 2024-05-04 MED ORDER — CHLORHEXIDINE GLUCONATE CLOTH 2 % EX PADS
6.0000 | MEDICATED_PAD | Freq: Every day | CUTANEOUS | Status: AC
  Administered 2024-05-04 – 2024-06-14 (×41): 6 via TOPICAL

## 2024-05-04 MED ORDER — SODIUM CHLORIDE 0.9% FLUSH
10.0000 mL | INTRAVENOUS | Status: AC | PRN
  Administered 2024-06-07: 10 mL

## 2024-05-04 NOTE — Plan of Care (Signed)
°  Patient is progressing towards goals of care    Problem: Education: Goal: Knowledge of General Education information will improve Description: Including pain rating scale, medication(s)/side effects and non-pharmacologic comfort measures Outcome: Progressing   Problem: Health Behavior/Discharge Planning: Goal: Ability to manage health-related needs will improve Outcome: Progressing   Problem: Clinical Measurements: Goal: Ability to maintain clinical measurements within normal limits will improve Outcome: Progressing Goal: Will remain free from infection Outcome: Progressing Goal: Diagnostic test results will improve Outcome: Progressing Goal: Respiratory complications will improve Outcome: Progressing Goal: Cardiovascular complication will be avoided Outcome: Progressing   Problem: Activity: Goal: Risk for activity intolerance will decrease Outcome: Progressing   Problem: Nutrition: Goal: Adequate nutrition will be maintained Outcome: Progressing   Problem: Coping: Goal: Level of anxiety will decrease Outcome: Progressing   Problem: Elimination: Goal: Will not experience complications related to bowel motility Outcome: Progressing Goal: Will not experience complications related to urinary retention Outcome: Progressing   Problem: Pain Managment: Goal: General experience of comfort will improve and/or be controlled Outcome: Progressing   Problem: Safety: Goal: Ability to remain free from injury will improve Outcome: Progressing   Problem: Skin Integrity: Goal: Risk for impaired skin integrity will decrease Outcome: Progressing
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
## 2024-05-04 NOTE — Progress Notes (Signed)
 Peripherally Inserted Central Catheter Placement  The IV Nurse has discussed with the patient and/or persons authorized to consent for the patient, the purpose of this procedure and the potential benefits and risks involved with this procedure.  The benefits include less needle sticks, lab draws from the catheter, and the patient may be discharged home with the catheter. Risks include, but not limited to, infection, bleeding, blood clot (thrombus formation), and puncture of an artery; nerve damage and irregular heartbeat and possibility to perform a PICC exchange if needed/ordered by physician.  Alternatives to this procedure were also discussed.  Bard Power PICC patient education guide, fact sheet on infection prevention and patient information card has been provided to patient /or left at bedside. Telephone consent obtained 12/26 from son.  PICC Placement Documentation  PICC Single Lumen 05/04/24 Right Basilic 36 cm 0 cm (Active)  Indication for Insertion or Continuance of Line Prolonged intravenous therapies 05/04/24 1300  Exposed Catheter (cm) 0 cm 05/04/24 1300  Site Assessment Clean, Dry, Intact 05/04/24 1300  Line Status Flushed;Saline locked;Blood return noted 05/04/24 1300  Dressing Type Transparent;Securing device 05/04/24 1300  Dressing Status Antimicrobial disc/dressing in place 05/04/24 1300  Line Care Connections checked and tightened 05/04/24 1300  Line Adjustment (NICU/IV Team Only) No 05/04/24 1300  Dressing Intervention New dressing;Adhesive placed at insertion site (IV team only) 05/04/24 1300  Dressing Change Due 05/11/24 05/04/24 1300       Leita  Tobey Schmelzle 05/04/2024, 1:31 PM

## 2024-05-04 NOTE — Plan of Care (Signed)

## 2024-05-05 DIAGNOSIS — K7200 Acute and subacute hepatic failure without coma: Secondary | ICD-10-CM | POA: Diagnosis not present

## 2024-05-05 DIAGNOSIS — Z7189 Other specified counseling: Secondary | ICD-10-CM

## 2024-05-05 LAB — GLUCOSE, CAPILLARY
Glucose-Capillary: 103 mg/dL — ABNORMAL HIGH (ref 70–99)
Glucose-Capillary: 123 mg/dL — ABNORMAL HIGH (ref 70–99)
Glucose-Capillary: 163 mg/dL — ABNORMAL HIGH (ref 70–99)
Glucose-Capillary: 72 mg/dL (ref 70–99)
Glucose-Capillary: 77 mg/dL (ref 70–99)
Glucose-Capillary: 83 mg/dL (ref 70–99)
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
## 2024-05-05 NOTE — Plan of Care (Signed)
   Problem: Education: Goal: Knowledge of General Education information will improve Description Including pain rating scale, medication(s)/side effects and non-pharmacologic comfort measures Outcome: Progressing   Problem: Education: Goal: Knowledge of General Education information will improve Description Including pain rating scale, medication(s)/side effects and non-pharmacologic comfort measures Outcome: Progressing
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
## 2024-05-05 NOTE — Plan of Care (Signed)

## 2024-05-05 NOTE — Progress Notes (Signed)
 Pt noted having a short burst of non-sustained SVT at 04:36:10 confirmed with telemetry

## 2024-05-06 DIAGNOSIS — R410 Disorientation, unspecified: Secondary | ICD-10-CM | POA: Diagnosis not present

## 2024-05-06 DIAGNOSIS — I330 Acute and subacute infective endocarditis: Secondary | ICD-10-CM | POA: Diagnosis not present

## 2024-05-06 DIAGNOSIS — L8990 Pressure ulcer of unspecified site, unspecified stage: Secondary | ICD-10-CM | POA: Diagnosis not present

## 2024-05-06 DIAGNOSIS — Z515 Encounter for palliative care: Secondary | ICD-10-CM | POA: Diagnosis not present

## 2024-05-06 DIAGNOSIS — K7200 Acute and subacute hepatic failure without coma: Secondary | ICD-10-CM | POA: Diagnosis not present

## 2024-05-06 DIAGNOSIS — R627 Adult failure to thrive: Secondary | ICD-10-CM

## 2024-05-06 DIAGNOSIS — R531 Weakness: Secondary | ICD-10-CM | POA: Diagnosis not present

## 2024-05-06 LAB — CBC WITH DIFFERENTIAL/PLATELET
Abs Immature Granulocytes: 0.01 K/uL (ref 0.00–0.07)
Basophils Absolute: 0 K/uL (ref 0.0–0.1)
Basophils Relative: 1 %
Eosinophils Absolute: 0.2 K/uL (ref 0.0–0.5)
Eosinophils Relative: 6 %
HCT: 28.2 % — ABNORMAL LOW (ref 36.0–46.0)
Hemoglobin: 9 g/dL — ABNORMAL LOW (ref 12.0–15.0)
Immature Granulocytes: 0 %
Lymphocytes Relative: 33 %
Lymphs Abs: 1 K/uL (ref 0.7–4.0)
MCH: 31.7 pg (ref 26.0–34.0)
MCHC: 31.9 g/dL (ref 30.0–36.0)
MCV: 99.3 fL (ref 80.0–100.0)
Monocytes Absolute: 0.3 K/uL (ref 0.1–1.0)
Monocytes Relative: 10 %
Neutro Abs: 1.5 K/uL — ABNORMAL LOW (ref 1.7–7.7)
Neutrophils Relative %: 50 %
Platelets: 129 K/uL — ABNORMAL LOW (ref 150–400)
RBC: 2.84 MIL/uL — ABNORMAL LOW (ref 3.87–5.11)
RDW: 19.6 % — ABNORMAL HIGH (ref 11.5–15.5)
WBC: 3 K/uL — ABNORMAL LOW (ref 4.0–10.5)
nRBC: 0 % (ref 0.0–0.2)

## 2024-05-06 LAB — GLUCOSE, CAPILLARY
Glucose-Capillary: 122 mg/dL — ABNORMAL HIGH (ref 70–99)
Glucose-Capillary: 96 mg/dL (ref 70–99)
Glucose-Capillary: 82 mg/dL (ref 70–99)

## 2024-05-06 MED ORDER — LORAZEPAM 2 MG/ML IJ SOLN
1.0000 mg | INTRAMUSCULAR | Status: DC | PRN
  Administered 2024-05-08 – 2024-05-09 (×3): 1 mg via INTRAVENOUS
  Filled 2024-05-06 (×3): qty 1
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
## 2024-05-06 NOTE — Plan of Care (Signed)

## 2024-05-07 DIAGNOSIS — K7200 Acute and subacute hepatic failure without coma: Secondary | ICD-10-CM | POA: Diagnosis not present

## 2024-05-07 NOTE — Plan of Care (Signed)

## 2024-05-07 NOTE — Progress Notes (Signed)
 Nutrition Brief Note  Chart reviewed. Pt now transitioning to comfort care.  No further nutrition interventions planned at this time.  Please re-consult as needed.   Blair Deaner MS, RD, LDN Registered Dietitian I Clinical Nutrition RD Inpatient Contact Info in Amion

## 2024-05-07 NOTE — Progress Notes (Signed)
 "                        PROGRESS NOTE        PATIENT DETAILS Name: [name] Age: 78 y.o. Sex: female Date of Birth: 12/01/1945 Admit Date: 04/13/2024 Admitting Physician Tamela Stakes, MD ERE:Bzmryzrx, Rojelio PARAS, NP  Brief Summary:  Patient is a 78 y.o.  female with history of CVA, COPD, HTN-who was brought to the ED after she was found by her roommate unresponsive (no heat/light-under a single blanket)-she was apparently covered in feces/dried blood-found to be hypothermic/hypotensive/hypoglycemic-she was thought to have undifferentiated shock-acute liver failure-acute kidney injury-admitted to the ICU-stabilized with pressors/D10 infusion/broad-spectrum antibiotics-rapidly improved and subsequently transferred to [facility].  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  Unfortunately-in spite of being on IV antibiotics-other supportive measures-an extended hospitalization-patient did not have any significant clinical improvement-oral intake continues to be erratic and mostly poor-palliative care was involved-after extensive discussion with patient's son-was transition to comfort measures on 12/29.  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 12/8>> ANA: Positive 12/8>> anti-smooth muscle antibody: Negative  Significant microbiology data: 12/6>> COVID/influenza/RSV PCR: Negative 12/6>> blood culture: No growth 12/6>> acute hepatitis serology: Negative 12/6>> CMV DNA PCR: Negative 12/9>> blood culture: No growth  Procedures: Line insertion 05/04/2024  Consults: PCCM GI ID Hematology [name]) Palliative care  Subjective:    Lying flat-relatively awake/alert-answer simple questions appropriately.  Oral intake continues to be erratic per nursing staff.   Objective: Vitals: Blood pressure (!) 126/55, pulse 73, temperature 99 F (37.2 C), temperature source Oral, resp. rate (!) 21, height 5' 10 (1.778 m), weight 79.4 kg, SpO2 100%.   Exam: Awake-relatively alert-answers questions appropriately Chronically sick appearing-but not in any distress Has chronic left-sided hemiparesis. Abdomen: Soft nontender nondistended.  Assessment/Plan: Goals of care Palliative input greatly appreciated, overall patient noncompliant, does appear with dementia/cognitive issues, does not  have capacity, palliative medicine and APS involved, continues to have very poor oral intake, very poor life quality, significant psychosocial issues, at this point decision has been made to to focus on comfort, and foregoing life-prolonging measures.  Awaiting appropriate disposition-either residential hospice or SNF.  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  Due to thrombocytopenia-TEE was not pursued-however it was felt that since patient would be treated for 6 weeks-TEE would not change management.  PICC line was placed on  12/27.  Given ANA positive status-nonbacterial thrombotic endocarditis is also possible.   Unfortunately-no significant clinical improvement-has been followed by palliative care services closely-after discussion with patient's son-all antibiotics was discontinued 12/29-with focus mostly on comfort.  Thrombocytopenia Thought to be either secondary to  ITP or from acute liver injury/failure/sepsis physiology.   Treated with 2 doses of IVIG on 12/12 and 12/13 without any significant improvement-subsequently given Nplate  on 12/14 and 12/19-thankfully platelet counts are now obtained during  Normocytic anemia   Likely secondary to critical illness-no evidence of blood loss/GI bleeding Required 2 units of PRBC so far  SVT on 12/15 Required adenosine  Currently stable on beta-blocker TSH stable.    Acute metabolic encephalopathy-likely superimposed on underlying dementia. Hospital delirium Acute metabolic encephalopathy initially felt to be secondary to possible sepsis physiology/liver failure-however continued to have waxing/waning mentation-even after treatment of underlying sepsis physiology/improvement in liver function- at this point is secondary to hospital-acquired delirium-probably superimposed on some amount of undiagnosed dementia.  Evaluated by psychiatry-does not have capacity Started on Remeron  and Seroquel  Has now been transition to full comfort measures.  Hypokalemia Replaced  Hypothermia Likely secondary to environmental exposure (per prior notes- cool room with single blanket-no heating or light) TSH stable Now normothermic after supportive care  Acute liver failure Unclear etiology-possibly shock liver from hypotension from possible sepsis  LFTs/coags improving Dopplers without portal/hepatic vein thrombosis Acute hepatitis serology negative CMV IgM positive but viral load negative  Negative workup for autoimmune hepatitis completed N-acetylcysteine  12/10 Empirically on  lactulose  Gastroenterology input appreciated  Oropharyngeal dysphagia Secondary to critical illness/encephalopathy SLP following-on dysphagia 2 diet.  AKI Hemodynamically mediated due to hypotension Resolved.  Hypokalemia/Hypophosphatemia Repleted  Metabolic acidosis Likely due to delayed clearance of lactic acid-in the setting of liver failure. Improved with supportive care  Self-limited episode-small-volume coffee-ground emesis Hb stable PPI By GI, resolved, no procedures.  Chest pain Likely atypical Resolved  Minimally elevated troponins Trend is flat Likely demand ischemia in the setting of hypotension/critical illness. Echo with stable EF  HTN BP stable, low-dose beta-blocker and Norvasc .  History of chronic HFpEF Status stable  History of CVA Baseline left hemiparesis Antiplatelets/statin held-in the setting of elevated liver enzymes and thrombocytopenia.  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  It is not felt that she has severe failure to thrive syndrome-extremely poor oral intake that is the primary cause of her hypoglycemia.  Unfortunately-in spite of adding Remeron -she has not significantly improved.  See above discussion regarding goals of care-now full comfort measures.  Pressure Ulcer: Agree with assessment and plan as outlined below Wound 04/14/24 0100 Pressure Injury Hip Left Stage 2 -  Partial thickness loss of dermis presenting as a shallow open injury with a red, pink wound bed  without slough. (Active)     Wound 04/14/24 0100 Pressure Injury Buttocks Left;Right Unstageable - Full thickness tissue loss in which the base of the injury is covered by slough (yellow, tan, gray, green or brown) and/or eschar (tan, brown or black) in the wound bed. (Active)     Wound 04/14/24 0100 Pressure Injury Sacrum Medial Stage 2 -  Partial thickness loss of dermis presenting as a shallow open injury with a red, pink wound bed without slough. (Active)    Code status:   Code Status: Do not attempt resuscitation (DNR) - Comfort care   DVT Prophylaxis: SCDs Start: 04/13/24 2344    Family Communication:  None at bedside.  Disposition Plan: Status is: Inpatient     Diet: Diet Order             DIET DYS 3 Room service appropriate? Yes; Fluid consistency: Thin  Diet effective now                     Data Review:   Patient Lines/Drains/Airways Status     Active Line/Drains/Airways     Name Placement date Placement time Site Days   PICC Single Lumen 05/04/24 Right Basilic 36 cm 0 cm 05/04/24  8676  Basilic  3   External Urinary Catheter 04/29/24  0553  --  8   Wound 04/14/24 0100 Pressure Injury Hip Left Stage 2 -  Partial thickness loss of dermis presenting as a shallow open injury with a red, pink wound bed without slough. 04/14/24  0100  Hip  23   Wound 04/14/24 0100 Pressure Injury Buttocks Left;Right Unstageable - Full thickness tissue loss in which the base of the injury is covered by slough (yellow, tan, gray, green or brown) and/or eschar (tan, brown or black) in the wound bed. 04/14/24  0100  Buttocks  23   Wound 04/14/24 0100 Pressure Injury Sacrum Medial Stage 2 -  Partial thickness loss of dermis presenting as a shallow open injury with a red, pink wound bed without slough. 04/14/24  0100  Sacrum  23             Inpatient Medications  Scheduled Meds:  Chlorhexidine  Gluconate Cloth  6 each Topical Daily   feeding supplement  237 mL Oral TID BM    Gerhardt's butt cream   Topical BID   haloperidol  lactate  2 mg Intravenous Once   lactulose   20 g Oral BID   mirtazapine   30 mg Oral QHS   mouth rinse  15 mL Mouth Rinse 4 times per day   pantoprazole   40 mg Oral BID   QUEtiapine   25 mg Oral BID   sodium chloride  flush  10-40 mL Intracatheter Q12H   Continuous Infusions:   PRN Meds:.diphenhydrAMINE , docusate, HYDROcodone -acetaminophen , ipratropium-albuterol , LORazepam, melatonin, ondansetron  (ZOFRAN ) IV, mouth rinse, polyethylene glycol, QUEtiapine , sodium chloride  flush  DVT Prophylaxis  SCDs Start: 04/13/24 2344   Recent Labs  Lab 05/02/24 0633 05/03/24 1121 05/04/24 0528 05/06/24 0800  WBC 2.3* 2.7* 2.5* 3.0*  HGB 9.5* 9.3* 9.3* 9.0*  HCT 29.1* 28.4* 27.4* 28.2*  PLT 83* 97* 123* 129*  MCV 95.7 97.6 94.2 99.3  MCH 31.3 32.0 32.0 31.7  MCHC 32.6 32.7 33.9 31.9  RDW 20.4* 19.9* 19.4* 19.6*  LYMPHSABS 1.0 0.9 0.8 1.0  MONOABS 0.2 0.3 0.3 0.3  EOSABS 0.2 0.2 0.2 0.2  BASOSABS 0.0 0.0 0.0 0.0    Recent Labs  Lab 05/02/24 0633 05/04/24 0528  NA 135 136  K 3.6 3.3*  CL 106 106  CO2 22 21*  ANIONGAP 8 8  GLUCOSE 83 69*  BUN 11 17  CREATININE 0.50 0.46  CALCIUM  8.1* 7.9*      Recent Labs  Lab 05/02/24 0633 05/04/24 0528  CALCIUM  8.1* 7.9*    --------------------------------------------------------------------------------------------------------------- Lab Results  Component Value Date   CHOL 288 (H) 05/30/2023   HDL 78 05/30/2023   LDLCALC 165 (H) 05/30/2023   TRIG 248 (H) 05/30/2023   CHOLHDL 3.7 05/30/2023    Lab Results  Component Value Date   HGBA1C 5.3 09/11/2020   No results for input(s): TSH, T4TOTAL, FREET4, T3FREE, THYROIDAB in  the last 72 hours.  No results for input(s): VITAMINB12, FOLATE, FERRITIN, TIBC, IRON, RETICCTPCT in the last 72  hours. ------------------------------------------------------------------------------------------------------------------ Cardiac Enzymes No results for input(s): CKMB, TROPONINI, MYOGLOBIN in the last 168 hours.  Invalid input(s): CK  Micro Results No results found for this or any previous visit (from the past 240 hours).   Radiology Reports  No results found.   Signature  -   Donalda Applebaum M.D on 05/07/2024 at 8:24 PM   -  To page go to [email or web address]  "

## 2024-05-07 NOTE — TOC Progression Note (Signed)
 Transition of Care [facility]) - Progression Note    Patient Details  Name: Dyanna Seiter MRN: [id number] Date of Birth: [birth year]  [facility] CM/SW Contact  [name], LCSW Phone Number: 05/07/2024, 8:54 AM  Clinical Narrative:    CSW provided update to [facility] with APS ([phone]).   Expected Discharge Plan: Skilled Nursing Facility Barriers to Discharge: Continued Medical Work up               Expected Discharge Plan and Services In-house Referral: Clinical Social Work   Post Acute Care Choice: Skilled Nursing Facility Living arrangements for the past 2 months: Single Family Home                                       Social Drivers of Health (SDOH) Interventions SDOH Screenings   Food Insecurity: Food Insecurity Present (04/15/2024)  Housing: High Risk (04/22/2024)  Transportation Needs: Unmet Transportation Needs (04/15/2024)  Utilities: At Risk (04/15/2024)  Depression (PHQ2-9): Low Risk (06/04/2021)  Social Connections: Unknown (04/15/2024)  Tobacco Use: Medium Risk (03/22/2024)    Readmission Risk Interventions    01/15/2024    2:24 PM  Readmission Risk Prevention Plan  Transportation Screening Complete  HRI or Home Care Consult Complete  Social Work Consult for Recovery Care Planning/Counseling Complete  Palliative Care Screening Not Applicable  Medication Review Oceanographer) Complete
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
## 2024-05-08 DIAGNOSIS — N3000 Acute cystitis without hematuria: Secondary | ICD-10-CM | POA: Diagnosis not present

## 2024-05-08 DIAGNOSIS — R6521 Severe sepsis with septic shock: Secondary | ICD-10-CM | POA: Diagnosis not present

## 2024-05-08 DIAGNOSIS — D696 Thrombocytopenia, unspecified: Secondary | ICD-10-CM | POA: Diagnosis not present

## 2024-05-08 DIAGNOSIS — N179 Acute kidney failure, unspecified: Secondary | ICD-10-CM | POA: Diagnosis not present

## 2024-05-08 DIAGNOSIS — A419 Sepsis, unspecified organism: Secondary | ICD-10-CM | POA: Diagnosis not present

## 2024-05-09 DIAGNOSIS — N179 Acute kidney failure, unspecified: Secondary | ICD-10-CM | POA: Diagnosis not present

## 2024-05-09 DIAGNOSIS — D696 Thrombocytopenia, unspecified: Secondary | ICD-10-CM | POA: Diagnosis not present

## 2024-05-09 DIAGNOSIS — N3000 Acute cystitis without hematuria: Secondary | ICD-10-CM | POA: Diagnosis not present

## 2024-05-09 DIAGNOSIS — A419 Sepsis, unspecified organism: Secondary | ICD-10-CM | POA: Diagnosis not present

## 2024-05-09 NOTE — Progress Notes (Signed)
 "                        PROGRESS NOTE        PATIENT DETAILS Name: [name] Age: 79 y.o. Sex: female Date of Birth: [birth year] Admit Date: 04/13/2024 Admitting Physician Tamela Stakes, MD ERE:Bzmryzrx, Rojelio PARAS, NP  Brief Summary: Patient is a 79 y.o.  female with history of CVA, COPD, HTN-who was brought to the ED after she was found by her roommate unresponsive (no heat/light-under a single blanket)-she was apparently covered in feces/dried blood-found to be hypothermic/hypotensive/hypoglycemic-she was thought to have undifferentiated shock-acute liver failure-acute kidney injury-admitted to the ICU-stabilized with pressors/D10 infusion/broad-spectrum antibiotics-rapidly improved and subsequently transferred to [facility].  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  Unfortunately-in spite of being on IV antibiotics-other supportive measures-an extended hospitalization-patient did not have any significant clinical improvement-oral intake continues to be erratic and mostly poor-palliative care was involved-after extensive discussion with patient's son-was transition to comfort measures on 12/29.  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 12/8>> ANA: Positive 12/8>> anti-smooth muscle antibody: Negative  Significant microbiology data: 12/6>> COVID/influenza/RSV PCR: Negative 12/6>> blood culture: No growth 12/6>> acute hepatitis serology: Negative 12/6>> CMV DNA PCR: Negative 12/9>> blood culture: No growth  Procedures: Line insertion 05/04/2024  Consults: PCCM GI ID Hematology [name]) Palliative care  Subjective:    No major issues overnight-lying comfortably in bed.  Per nursing staff-oral intake is mostly poor-eating only a small portion of her meals.  Objective: Vitals: Blood pressure 135/64, pulse 64, temperature 98 F (36.7 C), temperature source Oral, resp. rate 16, height 5' 10 (1.778 m), weight 79.4 kg, SpO2 92%.   Exam: Relatively awake/alert this morning Answers simple questions appropriately Chronically sick/frail appearing but not in any distress Abdomen: Soft nontender Extremities: No edema Neurology: Left hemiparesis.    Assessment/Plan: Goals of care Palliative input greatly appreciated, overall patient noncompliant, does appear with dementia/cognitive issues, does not  have capacity, palliative medicine and APS involved, continues to have very poor oral intake, very poor life quality, significant psychosocial issues, at this point decision has been made to to focus on comfort, and foregoing life-prolonging measures.  Awaiting appropriate disposition-either residential hospice or SNF.  Her oral intake is mostly poor-per nursing staff-she is only eating a small portion of her meals.    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  Due to thrombocytopenia-TEE was not  pursued-however it was felt that since patient would be treated for 6 weeks-TEE would not change management.  PICC line was placed on 12/27.  Given ANA positive status-nonbacterial thrombotic endocarditis is also possible.   Unfortunately-no  significant clinical improvement-has been followed by palliative care services closely-after discussion with patient's son-all antibiotics was discontinued 12/29-with focus mostly on comfort.  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   Treated with 2 doses of IVIG on 12/12 and 12/13 without any significant improvement-subsequently given Nplate  on 12/14 and 12/19-thankfully platelet counts are now obtained during  Normocytic anemia   Likely secondary to critical illness-no evidence of blood loss/GI bleeding Required 2 units of PRBC so far  SVT on 12/15 Required adenosine  Currently stable on beta-blocker TSH stable.    Acute metabolic encephalopathy-likely superimposed on underlying dementia. Hospital delirium Acute metabolic encephalopathy initially felt to be secondary to possible sepsis physiology/liver failure-however continued to have waxing/waning mentation-even after treatment of underlying sepsis physiology/improvement in liver function- at this point is secondary to hospital-acquired delirium-probably superimposed on some amount of undiagnosed dementia.  Evaluated by psychiatry-does not have capacity Started on Remeron  and Seroquel  Has now been transition to full comfort measures.  Hypokalemia Replaced  Hypothermia Likely secondary to environmental exposure (per prior notes- cool room with single blanket-no heating or light) TSH stable Now normothermic after supportive care  Acute liver failure Unclear etiology-possibly shock liver from hypotension from possible sepsis  LFTs/coags improving Dopplers without portal/hepatic vein thrombosis Acute hepatitis serology  negative CMV IgM positive but viral load negative  Negative workup for autoimmune hepatitis completed N-acetylcysteine  12/10 Empirically on lactulose  Gastroenterology input appreciated  Oropharyngeal dysphagia Secondary to critical illness/encephalopathy SLP following-on dysphagia 2 diet.  AKI Hemodynamically mediated due to hypotension Resolved.  Hypokalemia/Hypophosphatemia Repleted  Metabolic acidosis Likely due to delayed clearance of lactic acid-in the setting of liver failure. Improved with supportive care  Self-limited episode-small-volume coffee-ground emesis Hb stable PPI By GI, resolved, no procedures.  Chest pain Likely atypical Resolved  Minimally elevated troponins Trend is flat Likely demand ischemia in the setting of hypotension/critical illness. Echo with stable EF  HTN BP stable, low-dose beta-blocker and Norvasc .  History of chronic HFpEF Status stable  History of CVA Baseline left hemiparesis Antiplatelets/statin held-in the setting of elevated liver enzymes and thrombocytopenia.  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  It is not felt that she has severe failure to thrive syndrome-extremely poor oral intake that is the primary cause of her hypoglycemia.  Unfortunately-in spite of adding Remeron -she has not significantly improved.  See above discussion regarding goals of care-now full comfort measures.  Pressure Ulcer: Agree with assessment and plan as outlined below Wound  04/14/24 0100 Pressure Injury Hip Left Stage 2 -  Partial thickness loss of dermis presenting as a shallow open injury with a red, pink wound bed without slough. (Active)     Wound 04/14/24 0100 Pressure Injury Buttocks Left;Right Unstageable - Full thickness tissue loss in which the base of the injury is covered by slough (yellow, tan, gray, green or brown) and/or eschar (tan, brown or black) in the wound bed. (Active)     Wound 04/14/24 0100 Pressure Injury Sacrum Medial Stage 2 -  Partial thickness loss of dermis presenting as a shallow open injury with a red, pink wound bed without slough. (Active)  Code status:   Code Status: Do not attempt resuscitation (DNR) - Comfort care   DVT Prophylaxis: SCDs Start: 04/13/24 2344    Family Communication: None at bedside.  Disposition Plan: Status is: Inpatient     Diet: Diet Order             DIET DYS 3 Room service appropriate? Yes; Fluid consistency: Thin  Diet effective now                     Data Review:   Patient Lines/Drains/Airways Status     Active Line/Drains/Airways     Name Placement date Placement time Site Days   PICC Single Lumen 05/04/24 Right Basilic 36 cm 0 cm 05/04/24  8676  Basilic  5   External Urinary Catheter 04/29/24  0553  --  10   Wound 04/14/24 0100 Pressure Injury Hip Left Stage 2 -  Partial thickness loss of dermis presenting as a shallow open injury with a red, pink wound bed without slough. 04/14/24  0100  Hip  25   Wound 04/14/24 0100 Pressure Injury Buttocks Left;Right Unstageable - Full thickness tissue loss in which the base of the injury is covered by slough (yellow, tan, gray, green or brown) and/or eschar (tan, brown or black) in the wound bed. 04/14/24  0100  Buttocks  25   Wound 04/14/24 0100 Pressure Injury Sacrum Medial Stage 2 -  Partial thickness loss of dermis presenting as a shallow open injury with a red, pink wound bed without slough. 04/14/24  0100  Sacrum  25              Inpatient Medications  Scheduled Meds:  Chlorhexidine  Gluconate Cloth  6 each Topical Daily   feeding supplement  237 mL Oral TID BM   Gerhardt's butt cream   Topical BID   lactulose   20 g Oral BID   mirtazapine   30 mg Oral QHS   mouth rinse  15 mL Mouth Rinse 4 times per day   pantoprazole   40 mg Oral BID   QUEtiapine   25 mg Oral BID   sodium chloride  flush  10-40 mL Intracatheter Q12H   Continuous Infusions:   PRN Meds:.diphenhydrAMINE , docusate, HYDROcodone -acetaminophen , ipratropium-albuterol , LORazepam, melatonin, ondansetron  (ZOFRAN ) IV, mouth rinse, polyethylene glycol, QUEtiapine , sodium chloride  flush  DVT Prophylaxis  SCDs Start: 04/13/24 2344   Recent Labs  Lab 05/03/24 1121 05/04/24 0528 05/06/24 0800  WBC 2.7* 2.5* 3.0*  HGB 9.3* 9.3* 9.0*  HCT 28.4* 27.4* 28.2*  PLT 97* 123* 129*  MCV 97.6 94.2 99.3  MCH 32.0 32.0 31.7  MCHC 32.7 33.9 31.9  RDW 19.9* 19.4* 19.6*  LYMPHSABS 0.9 0.8 1.0  MONOABS 0.3 0.3 0.3  EOSABS 0.2 0.2 0.2  BASOSABS 0.0 0.0 0.0    Recent Labs  Lab 05/04/24 0528  NA 136  K 3.3*  CL 106  CO2 21*  ANIONGAP 8  GLUCOSE 69*  BUN 17  CREATININE 0.46  CALCIUM  7.9*      Recent Labs  Lab 05/04/24 0528  CALCIUM  7.9*    --------------------------------------------------------------------------------------------------------------- Lab Results  Component Value Date   CHOL 288 (H) 05/30/2023   HDL 78 05/30/2023   LDLCALC 165 (H) 05/30/2023   TRIG 248 (H) 05/30/2023   CHOLHDL 3.7 05/30/2023    Lab Results  Component Value Date   HGBA1C 5.3 09/11/2020   No results for input(s): TSH, T4TOTAL, FREET4, T3FREE, THYROIDAB in the last 72 hours.  No results for input(s): VITAMINB12,  FOLATE, FERRITIN, TIBC, IRON, RETICCTPCT in the last 72 hours. ------------------------------------------------------------------------------------------------------------------ Cardiac Enzymes No results for input(s):  CKMB, TROPONINI, MYOGLOBIN in the last 168 hours.  Invalid input(s): CK  Micro Results No results found for this or any previous visit (from the past 240 hours).   Radiology Reports  No results found.   Signature  -   Donalda Applebaum M.D on 05/09/2024 at 10:16 AM   -  To page go to [email or web address]  "

## 2024-05-10 ENCOUNTER — Encounter (HOSPITAL_COMMUNITY): Payer: Self-pay

## 2024-05-10 DIAGNOSIS — D696 Thrombocytopenia, unspecified: Secondary | ICD-10-CM | POA: Diagnosis not present

## 2024-05-10 DIAGNOSIS — A419 Sepsis, unspecified organism: Secondary | ICD-10-CM | POA: Diagnosis not present

## 2024-05-10 DIAGNOSIS — N179 Acute kidney failure, unspecified: Secondary | ICD-10-CM | POA: Diagnosis not present

## 2024-05-10 DIAGNOSIS — N3000 Acute cystitis without hematuria: Secondary | ICD-10-CM | POA: Diagnosis not present

## 2024-05-10 NOTE — Progress Notes (Signed)
 "                        PROGRESS NOTE        PATIENT DETAILS Name: [name] Age: 79 y.o. Sex: female Date of Birth: 24-Dec-1945 Admit Date: 04/13/2024 Admitting Physician Tamela Stakes, MD ERE:Bzmryzrx, Rojelio PARAS, NP  Brief Summary: Patient is a 79 y.o.  female with history of CVA, COPD, HTN-who was brought to the ED after she was found by her roommate unresponsive (no heat/light-under a single blanket)-she was apparently covered in feces/dried blood-found to be hypothermic/hypotensive/hypoglycemic-she was thought to have undifferentiated shock-acute liver failure-acute kidney injury-admitted to the ICU-stabilized with pressors/D10 infusion/broad-spectrum antibiotics-rapidly improved and subsequently transferred to [facility].  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  Unfortunately-in spite of being on IV antibiotics-other supportive measures-an extended hospitalization-patient did not have any significant clinical improvement-oral intake continues to be erratic and mostly poor-palliative care was involved-after extensive discussion with patient's son-was transition to comfort measures on 12/29.  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 12/8>> ANA: Positive 12/8>> anti-smooth muscle antibody: Negative  Significant microbiology data: 12/6>> COVID/influenza/RSV PCR: Negative 12/6>> blood culture: No growth 12/6>> acute hepatitis serology: Negative 12/6>> CMV DNA PCR: Negative 12/9>> blood culture: No growth  Procedures: Line insertion 05/04/2024  Consults: PCCM GI ID Hematology [name]) Palliative care  Subjective:    No major issues overnight-very poor oral intake-eating barely 10-15% of her meals on some days-on some days she has no oral intake at all.  Objective: Vitals: Blood pressure (!) 140/63, pulse 85, temperature 97.6 F (36.4 C), temperature source Oral, resp. rate 16, height 5' 10 (1.778 m), weight 79.4 kg, SpO2 92%.   Exam: Sleeping comfortably-not in any distress Easily arouses-pleasantly confused Left-sided hemiparesis.    Assessment/Plan: Goals of care Palliative input greatly appreciated, overall patient noncompliant, does appear with dementia/cognitive issues, does not  have capacity, palliative medicine and APS involved, continues to have very poor oral intake, very poor life quality, significant psychosocial issues, at this point decision has been made to to focus on comfort, and foregoing life-prolonging measures.  Awaiting appropriate disposition-either residential hospice or SNF.  Her oral intake is mostly poor-per nursing staff-she is only eating a small portion of her meals.  Discussed with DOC staff-will get hospice evaluation to see if patient is a candidate for inpatient/residential hospice.  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  Due to  thrombocytopenia-TEE was not pursued-however it was felt that since patient would be treated for 6 weeks-TEE would not change management.  PICC line was placed on 12/27.  Given ANA positive status-nonbacterial thrombotic endocarditis is also  possible.   Unfortunately-no significant clinical improvement-has been followed by palliative care services closely-after discussion with patient's son-all antibiotics was discontinued 12/29-with focus mostly on comfort.  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   Treated with 2 doses of IVIG on 12/12 and 12/13 without any significant improvement-subsequently given Nplate  on 12/14 and 12/19-thankfully platelet counts are now obtained during  Normocytic anemia   Likely secondary to critical illness-no evidence of blood loss/GI bleeding Required 2 units of PRBC so far  SVT on 12/15 Required adenosine  Currently stable on beta-blocker TSH stable.    Acute metabolic encephalopathy-likely superimposed on underlying dementia. Hospital delirium Acute metabolic encephalopathy initially felt to be secondary to possible sepsis physiology/liver failure-however continued to have waxing/waning mentation-even after treatment of underlying sepsis physiology/improvement in liver function- at this point is secondary to hospital-acquired delirium-probably superimposed on some amount of undiagnosed dementia.  Evaluated by psychiatry-does not have capacity Started on Remeron  and Seroquel  Has now been transition to full comfort measures.  Hypokalemia Replaced  Hypothermia Likely secondary to environmental exposure (per prior notes- cool room with single blanket-no heating or light) TSH stable Now normothermic after supportive care  Acute liver failure Unclear etiology-possibly shock liver from hypotension from possible sepsis  LFTs/coags improving Dopplers without portal/hepatic vein thrombosis Acute  hepatitis serology negative CMV IgM positive but viral load negative  Negative workup for autoimmune hepatitis completed N-acetylcysteine  12/10 Empirically on lactulose  Gastroenterology input appreciated  Oropharyngeal dysphagia Secondary to critical illness/encephalopathy SLP following-on dysphagia 2 diet.  AKI Hemodynamically mediated due to hypotension Resolved.  Hypokalemia/Hypophosphatemia Repleted  Metabolic acidosis Likely due to delayed clearance of lactic acid-in the setting of liver failure. Improved with supportive care  Self-limited episode-small-volume coffee-ground emesis Hb stable PPI By GI, resolved, no procedures.  Chest pain Likely atypical Resolved  Minimally elevated troponins Trend is flat Likely demand ischemia in the setting of hypotension/critical illness. Echo with stable EF  HTN BP stable, low-dose beta-blocker and Norvasc .  History of chronic HFpEF Status stable  History of CVA Baseline left hemiparesis Antiplatelets/statin held-in the setting of elevated liver enzymes and thrombocytopenia.  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  It is not felt that she has severe failure to thrive syndrome-extremely poor oral intake that is the primary cause of her hypoglycemia.  Unfortunately-in spite of adding Remeron -she has not significantly improved.  See above discussion regarding goals of care-now full comfort measures.  Pressure Ulcer: Agree with assessment and plan as outlined  below Wound 04/14/24 0100 Pressure Injury Hip Left Stage 2 -  Partial thickness loss of dermis presenting as a shallow open injury with a red, pink wound bed without slough. (Active)     Wound 04/14/24 0100 Pressure Injury Buttocks Left;Right Unstageable - Full thickness tissue loss in which the base of the injury is covered by slough (yellow, tan, gray, green or brown) and/or eschar (tan, brown or black) in the wound bed. (Active)     Wound 04/14/24 0100 Pressure Injury Sacrum Medial Stage 2 -  Partial thickness loss of dermis presenting as a shallow open injury with a red, pink wound  bed without slough. (Active)    Code status:   Code Status: Do not attempt resuscitation (DNR) - Comfort care   DVT Prophylaxis: SCDs Start: 04/13/24 2344    Family Communication: None at bedside.  Disposition Plan: Status is: Inpatient     Diet: Diet Order             DIET DYS 3 Room service appropriate? Yes; Fluid consistency: Thin  Diet effective now                     Data Review:   Patient Lines/Drains/Airways Status     Active Line/Drains/Airways     Name Placement date Placement time Site Days   PICC Single Lumen 05/04/24 Right Basilic 36 cm 0 cm 05/04/24  8676  Basilic  6   External Urinary Catheter 04/29/24  0553  --  11   Wound 04/14/24 0100 Pressure Injury Hip Left Stage 2 -  Partial thickness loss of dermis presenting as a shallow open injury with a red, pink wound bed without slough. 04/14/24  0100  Hip  26   Wound 04/14/24 0100 Pressure Injury Buttocks Left;Right Unstageable - Full thickness tissue loss in which the base of the injury is covered by slough (yellow, tan, gray, green or brown) and/or eschar (tan, brown or black) in the wound bed. 04/14/24  0100  Buttocks  26   Wound 04/14/24 0100 Pressure Injury Sacrum Medial Stage 2 -  Partial thickness loss of dermis presenting as a shallow open injury with a red, pink wound bed without slough. 04/14/24  0100  Sacrum  26              Inpatient Medications  Scheduled Meds:  Chlorhexidine  Gluconate Cloth  6 each Topical Daily   feeding supplement  237 mL Oral TID BM   Gerhardt's butt cream   Topical BID   lactulose   20 g Oral BID   mirtazapine   30 mg Oral QHS   mouth rinse  15 mL Mouth Rinse 4 times per day   pantoprazole   40 mg Oral BID   QUEtiapine   25 mg Oral BID   sodium chloride  flush  10-40 mL Intracatheter Q12H   Continuous Infusions:   PRN Meds:.diphenhydrAMINE , docusate, HYDROcodone -acetaminophen , ipratropium-albuterol , LORazepam, melatonin, ondansetron  (ZOFRAN ) IV, mouth rinse, polyethylene glycol, QUEtiapine , sodium chloride  flush  DVT Prophylaxis  SCDs Start: 04/13/24 2344   Recent Labs  Lab 05/04/24 0528 05/06/24 0800  WBC 2.5* 3.0*  HGB 9.3* 9.0*  HCT 27.4* 28.2*  PLT 123* 129*  MCV 94.2 99.3  MCH 32.0 31.7  MCHC 33.9 31.9  RDW 19.4* 19.6*  LYMPHSABS 0.8 1.0  MONOABS 0.3 0.3  EOSABS 0.2 0.2  BASOSABS 0.0 0.0    Recent Labs  Lab 05/04/24 0528  NA 136  K 3.3*  CL 106  CO2 21*  ANIONGAP 8  GLUCOSE 69*  BUN 17  CREATININE 0.46  CALCIUM  7.9*      Recent Labs  Lab 05/04/24 0528  CALCIUM  7.9*    --------------------------------------------------------------------------------------------------------------- Lab Results  Component Value Date   CHOL 288 (H) 05/30/2023   HDL 78 05/30/2023   LDLCALC 165 (H) 05/30/2023   TRIG 248 (H) 05/30/2023   CHOLHDL 3.7 05/30/2023    Lab Results  Component Value Date   HGBA1C 5.3 09/11/2020   No results for input(s): TSH, T4TOTAL, FREET4, T3FREE, THYROIDAB in the last 72 hours.  No results for input(s): VITAMINB12, FOLATE, FERRITIN, TIBC, IRON, RETICCTPCT in the  last 72 hours. ------------------------------------------------------------------------------------------------------------------ Cardiac Enzymes No results for input(s): CKMB, TROPONINI, MYOGLOBIN in the last 168  hours.  Invalid input(s): CK  Micro Results No results found for this or any previous visit (from the past 240 hours).   Radiology Reports  No results found.   Signature  -   Donalda Applebaum M.D on 05/10/2024 at 11:46 AM   -  To page go to [email or web address]  "

## 2024-05-10 NOTE — TOC Progression Note (Signed)
 Transition of Care [facility]) - Progression Note    Patient Details  Name: [name] MRN: [id number] Date of Birth: 06-11-1945  Transition [facility]) CM/SW Contact  [name], LCSW Phone Number: 05/10/2024, 3:02 PM  Clinical Narrative:    CSW poke with patient's son to discuss hospice facility placement. CSW sent referral to [facility] for review. Updated Camilla with APS.    Expected Discharge Plan: Hospice Medical Facility Barriers to Discharge: Continued Medical Work up               Expected Discharge Plan and Services In-house Referral: Clinical Social Work   Post Acute Care Choice: Skilled Nursing Facility Living arrangements for the past 2 months: Single Family Home                                       Social Drivers of Health (SDOH) Interventions SDOH Screenings   Food Insecurity: Food Insecurity Present (04/15/2024)  Housing: High Risk (04/22/2024)  Transportation Needs: Unmet Transportation Needs (04/15/2024)  Utilities: At Risk (04/15/2024)  Depression (PHQ2-9): Low Risk (06/04/2021)  Social Connections: Unknown (04/15/2024)  Tobacco Use: Medium Risk (03/22/2024)    Readmission Risk Interventions    01/15/2024    2:24 PM  Readmission Risk Prevention Plan  Transportation Screening Complete  HRI or Home Care Consult Complete  Social Work Consult for Recovery Care Planning/Counseling Complete  Palliative Care Screening Not Applicable  Medication Review Oceanographer) Complete

## 2024-05-10 NOTE — Plan of Care (Signed)

## 2024-05-11 ENCOUNTER — Inpatient Hospital Stay (HOSPITAL_COMMUNITY)

## 2024-05-11 DIAGNOSIS — N3000 Acute cystitis without hematuria: Secondary | ICD-10-CM | POA: Diagnosis not present

## 2024-05-11 DIAGNOSIS — N179 Acute kidney failure, unspecified: Secondary | ICD-10-CM | POA: Diagnosis not present

## 2024-05-11 DIAGNOSIS — D696 Thrombocytopenia, unspecified: Secondary | ICD-10-CM | POA: Diagnosis not present

## 2024-05-11 DIAGNOSIS — A419 Sepsis, unspecified organism: Secondary | ICD-10-CM | POA: Diagnosis not present

## 2024-05-11 NOTE — Progress Notes (Signed)
 "                        PROGRESS NOTE        PATIENT DETAILS Name: [name] Age: 79 y.o. Sex: female Date of Birth: [birth year] Admit Date: 04/13/2024 Admitting Physician Debra Stakes, MD ERE:Bzmryzrx, Debra PARAS, NP  Brief Summary: Patient is a 78 y.o.  female with history of CVA, COPD, HTN-who was brought to the ED after she was found by her roommate unresponsive (no heat/light-under a single blanket)-she was apparently covered in feces/dried blood-found to be hypothermic/hypotensive/hypoglycemic-she was thought to have undifferentiated shock-acute liver failure-acute kidney injury-admitted to the ICU-stabilized with pressors/D10 infusion/broad-spectrum antibiotics-rapidly improved and subsequently transferred to [facility].  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  Unfortunately-in spite of being on IV antibiotics-other supportive measures-an extended hospitalization-patient did not have any significant clinical improvement-oral intake continues to be erratic and mostly poor-palliative care was involved-after extensive discussion with patient's son-was transition to comfort measures on 12/29.  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 12/8>> ANA: Positive 12/8>> anti-smooth muscle antibody: Negative  Significant microbiology data: 12/6>> COVID/influenza/RSV PCR: Negative 12/6>> blood culture: No growth 12/6>> acute hepatitis serology: Negative 12/6>> CMV DNA PCR: Negative 12/9>> blood culture: No growth  Procedures: Line insertion 05/04/2024  Consults: PCCM GI ID Hematology [name]) Palliative care  Subjective:    No major issues overnight-oral intake is poor but erratic.  Objective: Vitals: Blood pressure (!) 119/57, pulse 65, temperature 98.5 F (36.9 C), temperature source Axillary, resp. rate 20, height 5' 10 (1.778 m), weight 79.4 kg, SpO2 98%.   Exam: Sleeping comfortably-not in any distress Easily arouses-pleasantly confused Left-sided hemiparesis.    Assessment/Plan: Goals of care Palliative input greatly appreciated, overall patient noncompliant, does appear with dementia/cognitive issues, does not  have capacity, palliative medicine and APS involved, continues to have very poor oral intake, very poor life quality, significant psychosocial issues, at this point decision has been made to to focus on comfort, and foregoing life-prolonging measures.  Awaiting appropriate disposition-either residential hospice or SNF.  Her oral intake is mostly poor-per nursing staff-she is only eating a small portion of her meals.  Discussed with DOC staff-will get hospice evaluation to see if patient is a candidate for inpatient/residential hospice if not deemed a candidate-then we will need to look at SNF with hospice options..  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  Due to  thrombocytopenia-TEE was not pursued-however it was felt that since patient would be treated for 6 weeks-TEE would not change management.  PICC line was placed on 12/27.  Given ANA positive status-nonbacterial thrombotic endocarditis is also  possible.   Unfortunately-no significant clinical improvement-has been followed by palliative care services closely-after discussion with patient's son-all antibiotics was discontinued 12/29-with focus mostly on comfort.  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   Treated with 2 doses of IVIG on 12/12 and 12/13 without any significant improvement-subsequently given Nplate  on 12/14 and 12/19-thankfully platelet counts are now obtained during  Normocytic anemia   Likely secondary to critical illness-no evidence of blood loss/GI bleeding Required 2 units of PRBC so far  SVT on 12/15 Required adenosine  Currently stable on beta-blocker TSH stable.    Acute metabolic encephalopathy-likely superimposed on underlying dementia. Hospital delirium Acute metabolic encephalopathy initially felt to be secondary to possible sepsis physiology/liver failure-however continued to have waxing/waning mentation-even after treatment of underlying sepsis physiology/improvement in liver function- at this point is secondary to hospital-acquired delirium-probably superimposed on some amount of undiagnosed dementia.  Evaluated by psychiatry-does not have capacity Started on Remeron  and Seroquel  Has now been transition to full comfort measures.  Hypokalemia Replaced  Hypothermia Likely secondary to environmental exposure (per prior notes- cool room with single blanket-no heating or light) TSH stable Now normothermic after supportive care  Acute liver failure Unclear etiology-possibly shock liver from hypotension from possible sepsis  LFTs/coags improving Dopplers without portal/hepatic vein thrombosis Acute  hepatitis serology negative CMV IgM positive but viral load negative  Negative workup for autoimmune hepatitis completed N-acetylcysteine  12/10 Empirically on lactulose  Gastroenterology input appreciated  Oropharyngeal dysphagia Secondary to critical illness/encephalopathy SLP following-on dysphagia 2 diet.  AKI Hemodynamically mediated due to hypotension Resolved.  Hypokalemia/Hypophosphatemia Repleted  Metabolic acidosis Likely due to delayed clearance of lactic acid-in the setting of liver failure. Improved with supportive care  Self-limited episode-small-volume coffee-ground emesis Hb stable PPI By GI, resolved, no procedures.  Chest pain Likely atypical Resolved  Minimally elevated troponins Trend is flat Likely demand ischemia in the setting of hypotension/critical illness. Echo with stable EF  HTN BP stable, low-dose beta-blocker and Norvasc .  History of chronic HFpEF Status stable  History of CVA Baseline left hemiparesis Antiplatelets/statin held-in the setting of elevated liver enzymes and thrombocytopenia.  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  It is not felt that she has severe failure to thrive syndrome-extremely poor oral intake that is the primary cause of her hypoglycemia.  Unfortunately-in spite of adding Remeron -she has not significantly improved.  See above discussion regarding goals of care-now full comfort measures.  Pressure Ulcer: Agree with assessment and plan as outlined  below Wound 04/14/24 0100 Pressure Injury Hip Left Stage 2 -  Partial thickness loss of dermis presenting as a shallow open injury with a red, pink wound bed without slough. (Active)     Wound 04/14/24 0100 Pressure Injury Buttocks Left;Right Unstageable - Full thickness tissue loss in which the base of the injury is covered by slough (yellow, tan, gray, green or brown) and/or eschar (tan, brown or black) in the wound bed. (Active)     Wound 04/14/24 0100 Pressure Injury Sacrum Medial Stage 2 -  Partial thickness loss of dermis presenting as a shallow open injury with a red, pink wound  bed without slough. (Active)    Code status:   Code Status: Do not attempt resuscitation (DNR) - Comfort care   DVT Prophylaxis: SCDs Start: 04/13/24 2344    Family Communication: None at bedside.  Disposition Plan: Status is: Inpatient     Diet: Diet Order             DIET DYS 3 Room service appropriate? Yes; Fluid consistency: Thin  Diet effective now                     Data Review:   Patient Lines/Drains/Airways Status     Active Line/Drains/Airways     Name Placement date Placement time Site Days   PICC Single Lumen 05/04/24 Right Basilic 36 cm 0 cm 05/04/24  8676  Basilic  7   External Urinary Catheter 04/29/24  0553  --  12   Wound 04/14/24 0100 Pressure Injury Hip Left Stage 2 -  Partial thickness loss of dermis presenting as a shallow open injury with a red, pink wound bed without slough. 04/14/24  0100  Hip  27   Wound 04/14/24 0100 Pressure Injury Buttocks Left;Right Unstageable - Full thickness tissue loss in which the base of the injury is covered by slough (yellow, tan, gray, green or brown) and/or eschar (tan, brown or black) in the wound bed. 04/14/24  0100  Buttocks  27   Wound 04/14/24 0100 Pressure Injury Sacrum Medial Stage 2 -  Partial thickness loss of dermis presenting as a shallow open injury with a red, pink wound bed without slough. 04/14/24  0100  Sacrum  27              Inpatient Medications  Scheduled Meds:  Chlorhexidine  Gluconate Cloth  6 each Topical Daily   feeding supplement  237 mL Oral TID BM   Gerhardt's butt cream   Topical BID   lactulose   20 g Oral BID   mirtazapine   30 mg Oral QHS   mouth rinse  15 mL Mouth Rinse 4 times per day   pantoprazole   40 mg Oral BID   QUEtiapine   25 mg Oral BID   sodium chloride  flush  10-40 mL Intracatheter Q12H   Continuous Infusions:   PRN Meds:.diphenhydrAMINE , docusate, HYDROcodone -acetaminophen , ipratropium-albuterol , LORazepam , melatonin, ondansetron  (ZOFRAN ) IV, mouth rinse, polyethylene glycol, QUEtiapine , sodium chloride  flush  DVT Prophylaxis  SCDs Start: 04/13/24 2344   Recent Labs  Lab 05/06/24 0800  WBC 3.0*  HGB 9.0*  HCT 28.2*  PLT 129*  MCV 99.3  MCH 31.7  MCHC 31.9  RDW 19.6*  LYMPHSABS 1.0  MONOABS 0.3  EOSABS 0.2  BASOSABS 0.0    No results for input(s): NA, K, CL, CO2, ANIONGAP, GLUCOSE, BUN, CREATININE, AST, ALT, ALKPHOS, BILITOT, ALBUMIN , CRP, DDIMER, PROCALCITON, LATICACIDVEN, INR, TSH, CORTISOL, HGBA1C, AMMONIA, BNP, MG, PHOS, CALCIUM  in the last 168 hours.  Invalid input(s): GFRCGP     No results for input(s): CRP, DDIMER, PROCALCITON, LATICACIDVEN, INR, TSH, CORTISOL, HGBA1C, AMMONIA, BNP, MG, CALCIUM  in the last 168 hours.  Invalid input(s): PHOSPHOROUS   --------------------------------------------------------------------------------------------------------------- Lab Results  Component Value Date   CHOL 288 (H) 05/30/2023   HDL 78 05/30/2023   LDLCALC 165 (H) 05/30/2023   TRIG 248 (H) 05/30/2023   CHOLHDL 3.7 05/30/2023    Lab Results  Component Value Date   HGBA1C 5.3 09/11/2020   No results for input(s): TSH, T4TOTAL, FREET4, T3FREE, THYROIDAB in the last 72 hours.  No results for input(s): VITAMINB12, FOLATE,  FERRITIN, TIBC,  IRON, RETICCTPCT in the last 72 hours. ------------------------------------------------------------------------------------------------------------------ Cardiac Enzymes No results for input(s): CKMB, TROPONINI, MYOGLOBIN in the last 168 hours.  Invalid input(s): CK  Micro Results No results found for this or any previous visit (from the past 240 hours).   Radiology Reports  No results found.   Signature  -   Donalda Applebaum M.D on 05/11/2024 at 10:31 AM   -  To page go to [email or web address]  "

## 2024-05-11 NOTE — Consult Note (Signed)
 Patient has been reviewed for consideration for [facility]. She does not meet criteria as of today and we do not have a bed available to offer today.  I have reached out to patient son to discuss Hospice services, no answer but left message requesting a return call. Feel free to call or text me at [phone] with any questions or concerns.  Matilda Pack, RN BSN [facility]

## 2024-05-12 DIAGNOSIS — N3000 Acute cystitis without hematuria: Secondary | ICD-10-CM | POA: Diagnosis not present

## 2024-05-12 DIAGNOSIS — D696 Thrombocytopenia, unspecified: Secondary | ICD-10-CM | POA: Diagnosis not present

## 2024-05-12 DIAGNOSIS — N179 Acute kidney failure, unspecified: Secondary | ICD-10-CM | POA: Diagnosis not present

## 2024-05-12 DIAGNOSIS — A419 Sepsis, unspecified organism: Secondary | ICD-10-CM | POA: Diagnosis not present

## 2024-05-12 LAB — BASIC METABOLIC PANEL WITH GFR
Anion gap: 6 (ref 5–15)
BUN: 18 mg/dL (ref 8–23)
CO2: 26 mmol/L (ref 22–32)
Calcium: 8.2 mg/dL — ABNORMAL LOW (ref 8.9–10.3)
Chloride: 110 mmol/L (ref 98–111)
Creatinine, Ser: 0.41 mg/dL — ABNORMAL LOW (ref 0.44–1.00)
GFR, Estimated: >60 mL/min
Glucose, Bld: 93 mg/dL (ref 70–99)
Potassium: 3.4 mmol/L — ABNORMAL LOW (ref 3.5–5.1)
Sodium: 142 mmol/L (ref 135–145)

## 2024-05-12 LAB — CBC
HCT: 26.8 % — ABNORMAL LOW (ref 36.0–46.0)
Hemoglobin: 8.6 g/dL — ABNORMAL LOW (ref 12.0–15.0)
MCH: 31.5 pg (ref 26.0–34.0)
MCHC: 32.1 g/dL (ref 30.0–36.0)
MCV: 98.2 fL (ref 80.0–100.0)
Platelets: 129 K/uL — ABNORMAL LOW (ref 150–400)
RBC: 2.73 MIL/uL — ABNORMAL LOW (ref 3.87–5.11)
RDW: 19 % — ABNORMAL HIGH (ref 11.5–15.5)
WBC: 4.2 K/uL (ref 4.0–10.5)
nRBC: 0 % (ref 0.0–0.2)

## 2024-05-12 MED ORDER — BISACODYL 10 MG RE SUPP
10.0000 mg | Freq: Every day | RECTAL | Status: AC | PRN
  Administered 2024-05-12 – 2024-06-12 (×3): 10 mg via RECTAL
  Filled 2024-05-12 (×4): qty 1

## 2024-05-12 MED ORDER — POTASSIUM CHLORIDE 20 MEQ PO PACK
40.0000 meq | PACK | Freq: Once | ORAL | Status: AC
  Administered 2024-05-12: 40 meq via ORAL
  Filled 2024-05-12: qty 2

## 2024-05-12 NOTE — Evaluation (Addendum)
 Occupational Therapy Re-Evaluation Patient Details Name: [name] MRN: [id number] DOB: February 23, 1946 Today's Date: 05/12/2024   History of Present Illness   Pt is a 79 y.o. F who presented to [facility] ED [facility]/13/24 via EMS after she was found by her roommate unresponsive (no heat/light-under a single blanket) covered feces/dried blood. Found to be hypothermic, hypotensive, hypoglycemic; thought to have undifferentiated shock, acute liver failure, AKI. Required ICU for stabilization. UA positive. 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 PMHx: HTN, HLD, CVA left hemiparesis, CAPD, DVT, IVH.     Clinical Impressions Pt seen for OT re-eval, not seen by OT since 04/16/24. She appears to be presenting near same functional level as prior OT evaluation, however goals updated accordingly. Min A for UB ADLs, Max/total A for LB ADLs. She presents today with flat affect, delayed command following and intermittent engagement with therapists. Total A +2 for supine<>sit and max A +2 to stand at EOB for linen change. Unable to fully come to upright posture, tolerated standing < 30 seconds. Returned to supine, all needs in reach.  Pt is currently functioning below baseline and would benefit from ongoing acute OT services to progress towards safe discharge and to facilitate return to prior level of function. Current recommendation is post-acute rehab (< 3 hours/day).     If plan is discharge home, recommend the following:   Two people to help with bathing/dressing/bathroom;Two people to help with walking and/or transfers;Assistance with feeding;Direct supervision/assist for financial management;Assistance with cooking/housework;Direct supervision/assist for medications  management     Functional Status Assessment   Patient has had a recent decline in their functional status and demonstrates the ability to make significant improvements in function in a reasonable and predictable amount of time.     Equipment Recommendations   Other (comment) (defer to next level of care)     Recommendations for Other Services         Precautions/Restrictions   Precautions Precautions: Fall Recall of Precautions/Restrictions: Impaired Precaution/Restrictions Comments: poor safety awareness, impaired cognition Restrictions Weight Bearing Restrictions Per Provider Order: No     Mobility Bed Mobility Overal bed mobility: Needs Assistance Bed Mobility: Rolling, Supine to Sit, Sit to Supine Rolling: Mod assist, Used rails   Supine to sit: Total assist, +2 for physical assistance Sit to supine: Total assist, +2 for physical assistance   General bed mobility comments: rolled bilaterally for pad exchange, heavy assist to EOB 2/2 cog sequencing deficits and LHB paresis, exited to the R side    Transfers Overall transfer level: Needs assistance Equipment used: 2 person hand held assist Transfers: Sit to/from Stand Sit to Stand: Max assist, +2 physical assistance, From elevated surface           General transfer comment: stood with B knee block, pt unable to fully come to upright stance at EOB, wanting to sit quickly due to fatigue, tolerated partial stance < 30 seconds      Balance Overall balance assessment: Needs assistance Sitting-balance support: Single extremity supported, Feet supported Sitting balance-Leahy Scale: Poor Sitting balance - Comments: EOB, reliant on RUE for support on mattress, needs intermittent CGA/min A to maintain upright Postural control: Posterior lean Standing balance support: Bilateral upper extremity supported, During functional activity Standing balance-Leahy Scale: Zero Standing balance comment: max A +2, reliant on  OT/PT support in static stance                           ADL either performed or assessed with clinical judgement   ADL Overall ADL's : Needs assistance/impaired Eating/Feeding: Minimal assistance;Sitting Eating/Feeding Details (indicate cue type and reason): EOB, assist to maintain balance while reaching/releasing cup from R grip Grooming: Contact guard assist;Sitting;Wash/dry face Grooming Details (indicate cue type and reason): EOB - simulation Upper Body Bathing: Maximal assistance   Lower Body Bathing: Total assistance   Upper Body Dressing : Maximal assistance   Lower Body Dressing: Total assistance       Toileting- Clothing Manipulation and Hygiene: Total assistance               Vision Baseline Vision/History:  (no eye glasses present at time of OT eval, unable to answer 2/2 poor cognition)       Perception         Praxis         Pertinent Vitals/Pain Pain Assessment Pain Assessment: No/denies pain     Extremity/Trunk Assessment Upper Extremity Assessment Upper Extremity Assessment: Generalized weakness;LUE deficits/detail LUE Deficits / Details: residual L hemiparesis from prior CVA; LUE flaccid LUE Sensation: decreased light touch LUE Coordination: decreased fine motor;decreased gross motor   Lower Extremity Assessment Lower Extremity Assessment: Defer to PT evaluation LLE Deficits / Details: Hx of L hemiparesis from previous CVA. No active motion noted on LLE. PROM WFL. LLE Coordination: decreased gross motor;decreased fine motor   Cervical / Trunk Assessment Cervical / Trunk Assessment: Normal   Communication Communication Communication: Impaired Factors Affecting Communication: Hearing impaired   Cognition Arousal: Alert Behavior During Therapy: Flat affect Cognition: No family/caregiver present to determine baseline             OT - Cognition Comments: cog impaired; pt states she can hear OT but often does not respond  during conversation; delayed command following                 Following commands: Impaired Following commands impaired: Only follows one step commands consistently, Follows one step commands with increased time     Cueing  General Comments   Cueing Techniques: Verbal cues;Tactile cues;Visual cues  VSS on RA   Exercises     Shoulder Instructions      Home Living Family/patient expects to be discharged to:: Skilled nursing facility Living Arrangements: Non-relatives/Friends;Children Available Help at Discharge: Family;Available PRN/intermittently Type of Home: Apartment Home Access: Stairs to enter Entrance Stairs-Number of Steps: 2-3 Entrance Stairs-Rails: Left Home Layout: Two level;Able to live on main level with bedroom/bathroom Alternate Level Stairs-Number of Steps: FF   Bathroom Shower/Tub: Chief Strategy Officer: Standard Bathroom Accessibility: Yes How Accessible: Accessible via walker Home Equipment: Shower seat;Rolling Walker (2 wheels);Wheelchair - manual   Additional Comments: Above information gathered from prior admission; pt is questionable historian who intermittently wouldn't respond to questions.      Prior Functioning/Environment Prior Level of Function : Patient poor historian/Family not available;Needs assist  Cognitive Assist : Mobility (cognitive);ADLs (cognitive)     Physical Assist : Mobility (physical);ADLs (physical) Mobility (physical): Bed mobility;Transfers ADLs (physical): Bathing;Dressing;Toileting;IADLs Mobility Comments: pt reports majority of time spent in bed, pt unable to elaborate on if she uses w/c / who assists her ADLs Comments: pt states she can feed and groom herselt, has assist with bathing/dressing/toilet, but unable to elaborate who helps her    OT  Problem List: Decreased strength;Decreased range of motion;Decreased coordination;Decreased activity tolerance;Decreased cognition;Impaired balance (sitting  and/or standing);Impaired sensation;Pain;Decreased knowledge of use of DME or AE;Impaired tone;Impaired UE functional use   OT Treatment/Interventions: Self-care/ADL training;Therapeutic exercise;Patient/family education;Balance training;Neuromuscular education;Therapeutic activities;DME and/or AE instruction      OT Goals(Current goals can be found in the care plan section)   Acute Rehab OT Goals Patient Stated Goal: pt did not state goal OT Goal Formulation: Patient unable to participate in goal setting Time For Goal Achievement: 05/26/24 Potential to Achieve Goals: Fair   OT Frequency:  Min 1X/week    Co-evaluation PT/OT/SLP Co-Evaluation/Treatment: Yes Reason for Co-Treatment: Necessary to address cognition/behavior during functional activity;For patient/therapist safety;To address functional/ADL transfers PT goals addressed during session: Mobility/safety with mobility;Balance OT goals addressed during session: ADL's and self-care      AM-PAC OT 6 Clicks Daily Activity     Outcome Measure Help from another person eating meals?: A Little Help from another person taking care of personal grooming?: A Little Help from another person toileting, which includes using toliet, bedpan, or urinal?: Total Help from another person bathing (including washing, rinsing, drying)?: A Lot Help from another person to put on and taking off regular upper body clothing?: Total Help from another person to put on and taking off regular lower body clothing?: Total 6 Click Score: 11   End of Session Equipment Utilized During Treatment: Gait belt Nurse Communication: Mobility status  Activity Tolerance: Patient tolerated treatment well Patient left: in bed;with call bell/phone within reach;with bed alarm set  OT Visit Diagnosis: Other abnormalities of gait and mobility (R26.89);Muscle weakness (generalized) (M62.81);Hemiplegia and hemiparesis;Pain Hemiplegia - Right/Left: Left Hemiplegia -  dominant/non-dominant: Non-Dominant Hemiplegia - caused by:  (prior CVA)                Time: 9242-9182 OT Time Calculation (min): 20 min Charges:  OT General Charges $OT Visit: 1 Visit OT Evaluation $OT Re-eval: 1 Re-eval  Rikki HERO. [place], OTR/L [facility] Services [phone] Secure Chat Preferred  Talton Delpriore 05/12/2024, 9:17 AM

## 2024-05-12 NOTE — Progress Notes (Signed)
 "                        PROGRESS NOTE        PATIENT DETAILS Name: [name] Age: 79 y.o. Sex: female Date of Birth: 1946-01-09 Admit Date: 04/13/2024 Admitting Physician Tamela Stakes, MD ERE:Bzmryzrx, Rojelio PARAS, NP  Brief Summary: Patient is a 79 y.o.  female with history of CVA, COPD, HTN-who was brought to the ED after she was found by her roommate unresponsive (no heat/light-under a single blanket)-she was apparently covered in feces/dried blood-found to be hypothermic/hypotensive/hypoglycemic-she was thought to have undifferentiated shock-acute liver failure-acute kidney injury-admitted to the ICU-stabilized with pressors/D10 infusion/broad-spectrum antibiotics-rapidly improved and subsequently transferred to [facility].  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  Unfortunately-in spite of being on IV antibiotics-other supportive measures-an extended hospitalization-patient did not have any significant clinical improvement-oral intake continues to be erratic and mostly poor-palliative care was involved-after extensive discussion with patient's son-was transition to comfort measures on 12/29.  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 12/8>> ANA: Positive 12/8>> anti-smooth muscle antibody: Negative  Significant microbiology data: 12/6>> COVID/influenza/RSV PCR: Negative 12/6>> blood culture: No growth 12/6>> acute hepatitis serology: Negative 12/6>> CMV DNA PCR: Negative 12/9>> blood culture: No growth  Procedures: Line insertion 05/04/2024  Consults: PCCM GI ID Hematology [name]) Palliative care  Subjective:    No major issues overnight-oral intake erratic but does consume some meals intermittently.  Objective: Vitals: Blood pressure 125/66, pulse 61, temperature 97.6 F (36.4 C), temperature source Axillary, resp. rate 18, height 5' 10 (1.778 m), weight 79.4 kg, SpO2 97%.   Exam: Sleeping comfortably-not in any distress Easily arouses-pleasantly confused Left-sided hemiparesis.    Assessment/Plan: Goals of care Palliative input greatly appreciated, overall patient noncompliant, does appear with dementia/cognitive issues, does not  have capacity, palliative medicine and APS involved, continues to have very poor oral intake, very poor life quality, significant psychosocial issues, at this point decision has been made to to focus on comfort, and foregoing life-prolonging measures.  Awaiting appropriate disposition-either residential hospice or SNF.  Her oral intake is mostly poor-per nursing staff-she is only eating a small portion of her meals.  Evaluated by hospice-not a candidate for residential hospice-Will discuss with TOC if we can arrange for SNF with hospice follow-up instead.    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  Due to thrombocytopenia-TEE was not  pursued-however it was felt that since patient would be treated for 6 weeks-TEE would not change management.  PICC line was placed on 12/27.  Given ANA positive status-nonbacterial thrombotic endocarditis is also possible.   Unfortunately-no significant clinical improvement-has  been followed by palliative care services closely-after discussion with patient's son-all antibiotics was discontinued 12/29-with focus mostly on comfort.  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   Treated with 2 doses of IVIG on 12/12 and 12/13 without any significant improvement-subsequently given Nplate  on 12/14 and 12/19-thankfully platelet counts are now obtained during  Normocytic anemia   Likely secondary to critical illness-no evidence of blood loss/GI bleeding Required 2 units of PRBC so far  SVT on 12/15 Required adenosine  Currently stable on beta-blocker TSH stable.    Acute metabolic encephalopathy-likely superimposed on underlying dementia. Hospital delirium Acute metabolic encephalopathy initially felt to be secondary to possible sepsis physiology/liver failure-however continued to have waxing/waning mentation-even after treatment of underlying sepsis physiology/improvement in liver function- at this point is secondary to hospital-acquired delirium-probably superimposed on some amount of undiagnosed dementia.  Evaluated by psychiatry-does not have capacity Started on Remeron  and Seroquel  Has now been transition to full comfort measures.  Hypokalemia Replaced  Hypothermia Likely secondary to environmental exposure (per prior notes- cool room with single blanket-no heating or light) TSH stable Now normothermic after supportive care  Acute liver failure Unclear etiology-possibly shock liver from hypotension from possible sepsis  LFTs/coags improving Dopplers without portal/hepatic vein thrombosis Acute hepatitis serology  negative CMV IgM positive but viral load negative  Negative workup for autoimmune hepatitis completed N-acetylcysteine  12/10 Empirically on lactulose  Gastroenterology input appreciated  Oropharyngeal dysphagia Secondary to critical illness/encephalopathy SLP following-on dysphagia 2 diet.  AKI Hemodynamically mediated due to hypotension Resolved.  Hypokalemia/Hypophosphatemia Repleted  Metabolic acidosis Likely due to delayed clearance of lactic acid-in the setting of liver failure. Improved with supportive care  Self-limited episode-small-volume coffee-ground emesis Hb stable PPI By GI, resolved, no procedures.  Chest pain Likely atypical Resolved  Minimally elevated troponins Trend is flat Likely demand ischemia in the setting of hypotension/critical illness. Echo with stable EF  HTN BP stable, low-dose beta-blocker and Norvasc .  History of chronic HFpEF Status stable  History of CVA Baseline left hemiparesis Antiplatelets/statin held-in the setting of elevated liver enzymes and thrombocytopenia.  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  It is not felt that she has severe failure to thrive syndrome-extremely poor oral intake that is the primary cause of her hypoglycemia.  Unfortunately-in spite of adding Remeron -she has not significantly improved.  See above discussion regarding goals of care-now full comfort measures.  Pressure Ulcer: Agree with assessment and plan as outlined below Wound  04/14/24 0100 Pressure Injury Hip Left Stage 2 -  Partial thickness loss of dermis presenting as a shallow open injury with a red, pink wound bed without slough. (Active)     Wound 04/14/24 0100 Pressure Injury Buttocks Left;Right Unstageable - Full thickness tissue loss in which the base of the injury is covered by slough (yellow, tan, gray, green or brown) and/or eschar (tan, brown or black) in the wound bed. (Active)     Wound 04/14/24 0100 Pressure Injury Sacrum Medial Stage 2 -  Partial thickness loss of dermis presenting as a shallow open injury with a red, pink wound bed without slough. (Active)  Code status:   Code Status: Do not attempt resuscitation (DNR) - Comfort care   DVT Prophylaxis: SCDs Start: 04/13/24 2344    Family Communication: None at bedside.  Disposition Plan: Status is: Inpatient     Diet: Diet Order             DIET DYS 3 Room service appropriate? Yes; Fluid consistency: Thin  Diet effective now                     Data Review:   Patient Lines/Drains/Airways Status     Active Line/Drains/Airways     Name Placement date Placement time Site Days   PICC Single Lumen 05/04/24 Right Basilic 36 cm 0 cm 05/04/24  8676  Basilic  8   External Urinary Catheter 04/29/24  0553  --  13   Wound 04/14/24 0100 Pressure Injury Hip Left Stage 2 -  Partial thickness loss of dermis presenting as a shallow open injury with a red, pink wound bed without slough. 04/14/24  0100  Hip  28   Wound 04/14/24 0100 Pressure Injury Buttocks Left;Right Unstageable - Full thickness tissue loss in which the base of the injury is covered by slough (yellow, tan, gray, green or brown) and/or eschar (tan, brown or black) in the wound bed. 04/14/24  0100  Buttocks  28   Wound 04/14/24 0100 Pressure Injury Sacrum Medial Stage 2 -  Partial thickness loss of dermis presenting as a shallow open injury with a red, pink wound bed without slough. 04/14/24  0100  Sacrum  28              Inpatient Medications  Scheduled Meds:  Chlorhexidine  Gluconate Cloth  6 each Topical Daily   feeding supplement  237 mL Oral TID BM   Gerhardt's butt cream   Topical BID   lactulose   20 g Oral BID   mirtazapine   30 mg Oral QHS   mouth rinse  15 mL Mouth Rinse 4 times per day   pantoprazole   40 mg Oral BID   QUEtiapine   25 mg Oral BID   sodium chloride  flush  10-40 mL Intracatheter Q12H   Continuous Infusions:   PRN Meds:.diphenhydrAMINE , docusate, HYDROcodone -acetaminophen , ipratropium-albuterol , LORazepam , melatonin, ondansetron  (ZOFRAN ) IV, mouth rinse, polyethylene glycol, QUEtiapine , sodium chloride  flush  DVT Prophylaxis  SCDs Start: 04/13/24 2344   Recent Labs  Lab 05/06/24 0800 05/12/24 0328  WBC 3.0* 4.2  HGB 9.0* 8.6*  HCT 28.2* 26.8*  PLT 129* 129*  MCV 99.3 98.2  MCH 31.7 31.5  MCHC 31.9 32.1  RDW 19.6* 19.0*  LYMPHSABS 1.0  --   MONOABS 0.3  --   EOSABS 0.2  --   BASOSABS 0.0  --     Recent Labs  Lab 05/12/24 0328  NA 142  K 3.4*  CL 110  CO2 26  ANIONGAP 6  GLUCOSE 93  BUN 18  CREATININE 0.41*  CALCIUM  8.2*       Recent Labs  Lab 05/12/24 0328  CALCIUM  8.2*     --------------------------------------------------------------------------------------------------------------- Lab Results  Component Value Date   CHOL 288 (H) 05/30/2023   HDL 78 05/30/2023   LDLCALC 165 (H) 05/30/2023   TRIG 248 (H) 05/30/2023   CHOLHDL 3.7 05/30/2023    Lab Results  Component Value Date   HGBA1C 5.3 09/11/2020   No results for input(s): TSH, T4TOTAL, FREET4, T3FREE, THYROIDAB in the last 72 hours.  No results for input(s): VITAMINB12, FOLATE, FERRITIN, TIBC, IRON,  RETICCTPCT in the last 72 hours. ------------------------------------------------------------------------------------------------------------------ Cardiac Enzymes No results for input(s): CKMB, TROPONINI, MYOGLOBIN in the last 168  hours.  Invalid input(s): CK  Micro Results No results found for this or any previous visit (from the past 240 hours).   Radiology Reports  DG CHEST PORT 1 VIEW Result Date: 05/11/2024 CLINICAL DATA:  PICC placement EXAM: PORTABLE CHEST 1 VIEW COMPARISON:  April 25, 2024 FINDINGS: Stable cardiomegaly. Elevated right hemidiaphragm. Mild right basilar subsegmental atelectasis is noted. Calcified pleural plaque seen in left lung base. Interval placement of right-sided PICC line with distal tip in expected position of the SVC. IMPRESSION: Interval placement of right-sided PICC line with distal tip in expected position of the SVC. Electronically Signed   By: Lynwood Landy Raddle M.D.   On: 05/11/2024 12:08     Signature  -   Donalda Applebaum M.D on 05/12/2024 at 10:34 AM   -  To page go to [email or web address]  "

## 2024-05-12 NOTE — Evaluation (Signed)
 Physical Therapy Evaluation Patient Details Name: [name] MRN: [id number] DOB: 1945-11-09 Today's Date: 05/12/2024  History of Present Illness  Pt is a 79 y.o. F who presented to [facility] ED 04/13/24 via EMS after she was found by her roommate unresponsive (no heat/light-under a single blanket) covered feces/dried blood. Found to be hypothermic, hypotensive, hypoglycemic; thought to have undifferentiated shock, acute liver failure, AKI. Required ICU for stabilization. UA positive. 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 PMHx: HTN, HLD, CVA left hemiparesis, CAPD, DVT, IVH.   Clinical Impression  Pt admitted with above diagnosis. Examination limited by pt being a questionable historian and no family/caregiver present. Per chart review, pt was living in an apartment possibly with a roommate/children with intermittent family assist. She reports being primarily bed bound with possible intermittent use of a manual w/c. Pt currently with functional limitations due to the deficits listed below (see PT Problem List). She required modA to roll, totalA x2 for supine<>sit, CGA-minA to maintain seated balance, and maxA x2 for sit<>stand. Pt is currently limited by impaired cognition, decreased balance, generalized deconditioning, and impaired activity tolerance. Pt will benefit from acute skilled PT to increase her independence and safety with mobility to allow discharge. Recommend continued inpatient follow up therapy, <3 hours/day.    If plan is discharge home, recommend the following: Two people to help with walking and/or transfers;A lot of help with bathing/dressing/bathroom;Supervision due to cognitive status;Direct supervision/assist for medications management;Direct  supervision/assist for financial management;Assistance with feeding;Assistance with cooking/housework;Assist for transportation;Help with stairs or ramp for entrance   Can travel by private vehicle   No    Equipment Recommendations Hospital bed;Hoyer lift  Recommendations for Other Services       Functional Status Assessment Patient has had a recent decline in their functional status and/or demonstrates limited ability to make significant improvements in function in a reasonable and predictable amount of time     Precautions / Restrictions Precautions Precautions: Fall Recall of Precautions/Restrictions: Impaired Restrictions Weight Bearing Restrictions Per Provider Order: No      Mobility  Bed Mobility Overal bed mobility: Needs Assistance Bed Mobility: Rolling, Supine to Sit, Sit to Supine Rolling: Mod assist, Used rails   Supine to sit: Total assist, +2 for physical assistance Sit to supine: Total assist, +2 for physical assistance   General bed mobility comments: Pt sat up on R side of bed with increased time. She slowly initated bringing BLE towards EOB. Assist to managed BLE and trunk. Use of bed pad to pivot pt to sitting upright. Assist to return pt back to bed. She rolled R/L with modA to change bed pad underneath her. Repositioned using bed features, bed pad, and +2 assist.    Transfers Overall transfer level: Needs assistance Equipment used: 2 person hand held assist Transfers: Sit to/from Stand Sit to Stand: Max assist, +2 physical assistance, From elevated surface           General transfer comment: Pt stood from raised bed height. PT/OT on either side and use of bed pad to help pt power up and facilitate hip ext. Pt maintained BLE in knee flex. She quickly fatigued staying upright for <30sec.    Ambulation/Gait               General Gait Details: NT  Stairs  Wheelchair Mobility     Tilt Bed    Modified Rankin (Stroke  Patients Only)       Balance Overall balance assessment: Needs assistance Sitting-balance support: Single extremity supported Sitting balance-Leahy Scale: Fair Sitting balance - Comments: Pt sat EOB with CGA-minA. Assist to maintained LUE supported. Pt leaned backwards when attempting to lift RLE. Postural control: Posterior lean Standing balance support: Bilateral upper extremity supported, During functional activity Standing balance-Leahy Scale: Zero Standing balance comment: Pt dependent on maxA x2 to maintain static stance, she lacked fully erect posture with B knee flex.                             Pertinent Vitals/Pain Pain Assessment Pain Assessment: No/denies pain    Home Living Family/patient expects to be discharged to:: Skilled nursing facility Living Arrangements: Non-relatives/Friends;Children Available Help at Discharge: Family;Available PRN/intermittently Type of Home: Apartment Home Access: Stairs to enter Entrance Stairs-Rails: Left Entrance Stairs-Number of Steps: 2-3 Alternate Level Stairs-Number of Steps: flight Home Layout: Two level;Able to live on main level with bedroom/bathroom Home Equipment: Shower seat;Rolling Walker (2 wheels);Wheelchair - manual Additional Comments: Above information gathered from prior admission; pt is questionable historian who intermittently wouldn't respond to questions.    Prior Function Prior Level of Function : Patient poor historian/Family not available;Needs assist             Mobility Comments: Pt reports spending most of her time in bed. She states she has a w/c but is unable to elaborate on assistance needed to get in/out of bed, transfers, etc. ADLs Comments: Pt reports I do what I can. Unable to elaborate on assistance available or provided to help with ADLs/IADLs.     Extremity/Trunk Assessment   Upper Extremity Assessment Upper Extremity Assessment: Defer to OT evaluation    Lower Extremity  Assessment Lower Extremity Assessment: Difficult to assess due to impaired cognition;Generalized weakness;LLE deficits/detail LLE Deficits / Details: Hx of L hemiparesis from previous CVA. No active motion noted on LLE. PROM WFL. LLE Coordination: decreased gross motor;decreased fine motor    Cervical / Trunk Assessment Cervical / Trunk Assessment: Normal  Communication   Communication Communication: Impaired Factors Affecting Communication: Hearing impaired    Cognition Arousal: Alert Behavior During Therapy: Flat affect   PT - Cognitive impairments: No family/caregiver present to determine baseline, Orientation, Awareness, Memory, Attention, Initiation, Sequencing, Problem solving, Safety/Judgement   Orientation impairments: Place, Time, Situation (Pt took increased time to state DOB. She was unable to select location when given option. She was unable to determine month/year when cued it was the new year so start of the calendar. Unsure why she is in the hospital.)                   PT - Cognition Comments: Pt frequently not responding to questions. Intermittently engages with yes or no and rarely speaks in a short phrase. Pt denied being tired. Following commands: Impaired Following commands impaired: Only follows one step commands consistently, Follows one step commands with increased time     Cueing Cueing Techniques: Verbal cues, Tactile cues, Visual cues     General Comments General comments (skin integrity, edema, etc.): VSS on RA    Exercises     Assessment/Plan    PT Assessment Patient needs continued PT services  PT Problem List Decreased strength;Decreased coordination;Decreased cognition;Decreased activity tolerance;Decreased balance;Decreased mobility;Decreased safety awareness       PT Treatment  Interventions Balance training;Neuromuscular re-education;Functional mobility training;Therapeutic activities;Therapeutic exercise;Wheelchair mobility  training;Patient/family education    PT Goals (Current goals can be found in the Care Plan section)  Acute Rehab PT Goals PT Goal Formulation: Patient unable to participate in goal setting Time For Goal Achievement: 05/26/24 Potential to Achieve Goals: Fair    Frequency Min 1X/week     Co-evaluation PT/OT/SLP Co-Evaluation/Treatment: Yes Reason for Co-Treatment: Necessary to address cognition/behavior during functional activity;For patient/therapist safety;To address functional/ADL transfers PT goals addressed during session: Mobility/safety with mobility;Balance         AM-PAC PT 6 Clicks Mobility  Outcome Measure Help needed turning from your back to your side while in a flat bed without using bedrails?: A Lot Help needed moving from lying on your back to sitting on the side of a flat bed without using bedrails?: Total Help needed moving to and from a bed to a chair (including a wheelchair)?: Total Help needed standing up from a chair using your arms (e.g., wheelchair or bedside chair)?: Total Help needed to walk in hospital room?: Total Help needed climbing 3-5 steps with a railing? : Total 6 Click Score: 7    End of Session Equipment Utilized During Treatment: Gait belt Activity Tolerance: Patient tolerated treatment well Patient left: in bed;with call bell/phone within reach;with bed alarm set Nurse Communication: Mobility status;Need for lift equipment PT Visit Diagnosis: Unsteadiness on feet (R26.81);Muscle weakness (generalized) (M62.81);Other abnormalities of gait and mobility (R26.89)    Time: 9242-9182 PT Time Calculation (min) (ACUTE ONLY): 20 min   Charges:   PT Evaluation $PT Eval Moderate Complexity: 1 Mod   PT General Charges $$ ACUTE PT VISIT: 1 Visit         Randall SAUNDERS, PT, DPT Acute Rehabilitation Services Office: [phone] Secure Chat Preferred  [name] 05/12/2024, 8:38 AM

## 2024-05-13 DIAGNOSIS — D696 Thrombocytopenia, unspecified: Secondary | ICD-10-CM | POA: Diagnosis not present

## 2024-05-13 DIAGNOSIS — N179 Acute kidney failure, unspecified: Secondary | ICD-10-CM | POA: Diagnosis not present

## 2024-05-13 DIAGNOSIS — A419 Sepsis, unspecified organism: Secondary | ICD-10-CM | POA: Diagnosis not present

## 2024-05-13 DIAGNOSIS — R6521 Severe sepsis with septic shock: Secondary | ICD-10-CM | POA: Diagnosis not present

## 2024-05-13 DIAGNOSIS — R531 Weakness: Secondary | ICD-10-CM

## 2024-05-13 DIAGNOSIS — Z515 Encounter for palliative care: Secondary | ICD-10-CM | POA: Diagnosis not present

## 2024-05-13 DIAGNOSIS — N3000 Acute cystitis without hematuria: Secondary | ICD-10-CM | POA: Diagnosis not present

## 2024-05-13 DIAGNOSIS — R627 Adult failure to thrive: Secondary | ICD-10-CM | POA: Diagnosis not present

## 2024-05-13 NOTE — Progress Notes (Signed)
 "                        PROGRESS NOTE        PATIENT DETAILS Name: [name] Age: 79 y.o. Sex: female Date of Birth: 08-Dec-1945 Admit Date: 04/13/2024 Admitting Physician Tamela Stakes, MD ERE:Bzmryzrx, Rojelio PARAS, NP  Brief Summary: Patient is a 79 y.o.  female with history of CVA, COPD, HTN-who was brought to the ED after she was found by her roommate unresponsive (no heat/light-under a single blanket)-she was apparently covered in feces/dried blood-found to be hypothermic/hypotensive/hypoglycemic-she was thought to have undifferentiated shock-acute liver failure-acute kidney injury-admitted to the ICU-stabilized with pressors/D10 infusion/broad-spectrum antibiotics-rapidly improved and subsequently transferred to [facility].  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  Unfortunately-in spite of being on IV antibiotics-other supportive measures-an extended hospitalization-patient did not have any significant clinical improvement-oral intake continues to be erratic and mostly poor-palliative care was involved-after extensive discussion with patient's son-was transition to comfort measures on 12/29.  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 12/8>> ANA: Positive 12/8>> anti-smooth muscle antibody: Negative  Significant microbiology data: 12/6>> COVID/influenza/RSV PCR: Negative 12/6>> blood culture: No growth 12/6>> acute hepatitis serology: Negative 12/6>> CMV DNA PCR: Negative 12/9>> blood culture: No growth  Procedures: Line insertion 05/04/2024  Consults: PCCM GI ID Hematology [name]) Palliative care  Subjective:    -No major issues overnight.  Objective: Vitals: Blood pressure (!) 136/58, pulse 61, temperature 98.6 F (37 C), temperature source Axillary, resp. rate 18, height 5' 10 (1.778 m), weight 79.4 kg, SpO2 98%.   Exam: Sleeping comfortably-not in any distress Easily arouses-pleasantly confused Left-sided hemiparesis.    Assessment/Plan: Goals of care Palliative input greatly appreciated, overall patient noncompliant, does appear with dementia/cognitive issues, does not  have capacity, palliative medicine and APS involved, continues to have very poor oral intake, very poor life quality, significant psychosocial issues, at this point decision has been made to to focus on comfort, and foregoing life-prolonging measures.  Awaiting appropriate disposition-either residential hospice or SNF.  Her oral intake is mostly poor-per nursing staff-she is only eating a small portion of her meals.  Evaluated by hospice-not a candidate for residential hospice-discussed with TOC if we can arrange for SNF with hospice follow-up instead.    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  Due to thrombocytopenia-TEE was not pursued-however it was felt that since patient would be treated for 6  weeks-TEE would not change management.  PICC line was placed on 12/27.  Given ANA positive status-nonbacterial thrombotic endocarditis is also possible.   Unfortunately-no significant clinical improvement-has been followed by palliative care services closely-after discussion  with patient's son-all antibiotics was discontinued 12/29-with focus mostly on comfort.  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   Treated with 2 doses of IVIG on 12/12 and 12/13 without any significant improvement-subsequently given Nplate  on 12/14 and 12/19-thankfully platelet counts are now obtained during  Normocytic anemia   Likely secondary to critical illness-no evidence of blood loss/GI bleeding Required 2 units of PRBC so far  SVT on 12/15 Required adenosine  Currently stable on beta-blocker TSH stable.    Acute metabolic encephalopathy-likely superimposed on underlying dementia. Hospital delirium Acute metabolic encephalopathy initially felt to be secondary to possible sepsis physiology/liver failure-however continued to have waxing/waning mentation-even after treatment of underlying sepsis physiology/improvement in liver function- at this point is secondary to hospital-acquired delirium-probably superimposed on some amount of undiagnosed dementia.  Evaluated by psychiatry-does not have capacity Started on Remeron  and Seroquel  Has now been transition to full comfort measures.  Hypokalemia Replaced  Hypothermia Likely secondary to environmental exposure (per prior notes- cool room with single blanket-no heating or light) TSH stable Now normothermic after supportive care  Acute liver failure Unclear etiology-possibly shock liver from hypotension from possible sepsis  LFTs/coags improving Dopplers without portal/hepatic vein thrombosis Acute hepatitis serology negative CMV IgM positive but viral load negative  Negative workup for  autoimmune hepatitis completed N-acetylcysteine  12/10 Empirically on lactulose  Gastroenterology input appreciated  Oropharyngeal dysphagia Secondary to critical illness/encephalopathy SLP following-on dysphagia 2 diet.  AKI Hemodynamically mediated due to hypotension Resolved.  Hypokalemia/Hypophosphatemia Repleted  Metabolic acidosis Likely due to delayed clearance of lactic acid-in the setting of liver failure. Improved with supportive care  Self-limited episode-small-volume coffee-ground emesis Hb stable PPI By GI, resolved, no procedures.  Chest pain Likely atypical Resolved  Minimally elevated troponins Trend is flat Likely demand ischemia in the setting of hypotension/critical illness. Echo with stable EF  HTN BP stable, low-dose beta-blocker and Norvasc .  History of chronic HFpEF Status stable  History of CVA Baseline left hemiparesis Antiplatelets/statin held-in the setting of elevated liver enzymes and thrombocytopenia.  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  It is not felt that she has severe failure to thrive syndrome-extremely poor oral intake that is the primary cause of her hypoglycemia.  Unfortunately-in spite of adding Remeron -she has not significantly improved.  See above discussion regarding goals of care-now full comfort measures.  Pressure Ulcer: Agree with assessment and plan as outlined below Wound 04/14/24 0100 Pressure Injury Hip Left Stage 2 -  Partial thickness loss of  dermis presenting as a shallow open injury with a red, pink wound bed without slough. (Active)     Wound 04/14/24 0100 Pressure Injury Buttocks Left;Right Unstageable - Full thickness tissue loss in which the base of the injury is covered by slough (yellow, tan, gray, green or brown) and/or eschar (tan, brown or black) in the wound bed. (Active)     Wound 04/14/24 0100 Pressure Injury Sacrum Medial Stage 2 -  Partial thickness loss of dermis presenting as a shallow open injury with a red, pink wound bed without slough. (Active)    Code status:   Code Status: Do not  attempt resuscitation (DNR) - Comfort care   DVT Prophylaxis: SCDs Start: 04/13/24 2344    Family Communication: None at bedside.  Disposition Plan: Status is: Inpatient     Diet: Diet Order             DIET DYS 3 Room service appropriate? Yes; Fluid consistency: Thin  Diet effective now                     Data Review:   Patient Lines/Drains/Airways Status     Active Line/Drains/Airways     Name Placement date Placement time Site Days   PICC Single Lumen 05/04/24 Right Basilic 36 cm 0 cm 05/04/24  8676  Basilic  9   External Urinary Catheter 04/29/24  0553  --  14   Wound 04/14/24 0100 Pressure Injury Hip Left Stage 2 -  Partial thickness loss of dermis presenting as a shallow open injury with a red, pink wound bed without slough. 04/14/24  0100  Hip  29   Wound 04/14/24 0100 Pressure Injury Buttocks Left;Right Unstageable - Full thickness tissue loss in which the base of the injury is covered by slough (yellow, tan, gray, green or brown) and/or eschar (tan, brown or black) in the wound bed. 04/14/24  0100  Buttocks  29   Wound 04/14/24 0100 Pressure Injury Sacrum Medial Stage 2 -  Partial thickness loss of dermis presenting as a shallow open injury with a red, pink wound bed without slough. 04/14/24  0100  Sacrum  29             Inpatient Medications  Scheduled Meds:  Chlorhexidine  Gluconate  Cloth  6 each Topical Daily   feeding supplement  237 mL Oral TID BM   Gerhardt's butt cream   Topical BID   lactulose   20 g Oral BID   mirtazapine   30 mg Oral QHS   mouth rinse  15 mL Mouth Rinse 4 times per day   pantoprazole   40 mg Oral BID   QUEtiapine   25 mg Oral BID   sodium chloride  flush  10-40 mL Intracatheter Q12H   Continuous Infusions:   PRN Meds:.bisacodyl , diphenhydrAMINE , docusate, HYDROcodone -acetaminophen , ipratropium-albuterol , LORazepam , melatonin, ondansetron  (ZOFRAN ) IV, mouth rinse, polyethylene glycol, QUEtiapine , sodium chloride  flush  DVT Prophylaxis  SCDs Start: 04/13/24 2344   Recent Labs  Lab 05/12/24 0328  WBC 4.2  HGB 8.6*  HCT 26.8*  PLT 129*  MCV 98.2  MCH 31.5  MCHC 32.1  RDW 19.0*    Recent Labs  Lab 05/12/24 0328  NA 142  K 3.4*  CL 110  CO2 26  ANIONGAP 6  GLUCOSE 93  BUN 18  CREATININE 0.41*  CALCIUM  8.2*       Recent Labs  Lab 05/12/24 0328  CALCIUM  8.2*     --------------------------------------------------------------------------------------------------------------- Lab Results  Component Value Date   CHOL 288 (H) 05/30/2023   HDL 78 05/30/2023   LDLCALC 165 (H) 05/30/2023   TRIG 248 (H) 05/30/2023   CHOLHDL 3.7 05/30/2023    Lab Results  Component Value Date   HGBA1C 5.3 09/11/2020   No results for input(s): TSH, T4TOTAL, FREET4, T3FREE, THYROIDAB in the last 72 hours.  No results for input(s): VITAMINB12, FOLATE, FERRITIN, TIBC, IRON, RETICCTPCT in the last 72 hours. ------------------------------------------------------------------------------------------------------------------ Cardiac Enzymes No results for input(s): CKMB, TROPONINI, MYOGLOBIN in the last 168 hours.  Invalid input(s): CK  Micro Results No results found for this or any previous visit (from the past 240  hours).   Radiology Reports  DG CHEST PORT 1 VIEW Result Date: 05/11/2024 CLINICAL DATA:   PICC placement EXAM: PORTABLE CHEST 1 VIEW COMPARISON:  April 25, 2024 FINDINGS: Stable cardiomegaly. Elevated right hemidiaphragm. Mild right basilar subsegmental atelectasis is noted. Calcified pleural plaque seen in left lung base. Interval placement of right-sided PICC line with distal tip in expected position of the SVC. IMPRESSION: Interval placement of right-sided PICC line with distal tip in expected position of the SVC. Electronically Signed   By: Lynwood Landy Raddle M.D.   On: 05/11/2024 12:08     Signature  -   Donalda Applebaum M.D on 05/13/2024 at 11:35 AM   -  To page go to [email or web address]  "

## 2024-05-13 NOTE — Plan of Care (Signed)

## 2024-05-13 NOTE — Plan of Care (Signed)
  Problem: Clinical Measurements: Goal: Respiratory complications will improve Outcome: Progressing Goal: Cardiovascular complication will be avoided Outcome: Progressing   Problem: Elimination: Goal: Will not experience complications related to bowel motility Outcome: Progressing   Problem: Pain Managment: Goal: General experience of comfort will improve and/or be controlled Outcome: Progressing   Problem: Safety: Goal: Ability to remain free from injury will improve Outcome: Progressing

## 2024-05-13 NOTE — Progress Notes (Signed)
 Patient ID: [name], female   DOB: 09/24/1945, 79 y.o.   MRN: [id number]    Progress Note from the Palliative Medicine Team at [facility]   Patient Name: [name]        Date: 05/13/2024 DOB: [birth year]  Age: 79 y.o. MRN#: [id number] Attending Physician: Raenelle Donalda HERO, MD Primary Care Physician: Stephen Rojelio PARAS, NP Admit Date: 04/13/2024   Reason for Consultation/Follow-up   Establishing Goals of Care   HPI/ Brief Hospital Review  79 y.o.  female with history of CVA, COPD, HTN-who was brought to the ED after she was found by her roommate unresponsive (no heat/light-under a single blanket)-she was apparently covered in feces/dried blood-found to be hypothermic/hypotensive/hypoglycemic-she was thought to have undifferentiated shock-acute liver failure-acute kidney injury-admitted to the ICU-stabilized with pressors/D10 infusion/broad-spectrum antibiotics-rapidly improved and subsequently transferred to [facility] unit.     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   Complex family and social situation.  APS involved  Pending disposition.    Subjective  Extensive chart review has been completed prior to meeting with patient/family  including labs, vital signs, imaging, progress/consult notes, orders, medications and available advance directive documents.   This NP assessed patient at the bedside as a follow up to  yesterday's GOCs meeting.  No family at bedside Patient alert.  She does follow simple commands.  She greets me with good morning.  Patient continues to enjoy comfort feeds  Discussed in detail with Dr. Raenelle and [name] LCSW/TOC  Goals of care discussion had with son on 05/06/2024.  Outcome will shift to full comfort   A MOST form was completed electronically to reflect full comfort measures  -- view in Vynca.  - DNR/DNI - No artificial feeding or hydration now or in the future/comfort feeds as tolerated -No further diagnostics, CBGs, scans - No further IV antibiotics - Symptom management to enhance comfort and dignity, allowing for natural death. - TOC to help with next steps in transition of care  Attempted to contact family, voicemail left for son/[name] in an effort to maintain ongoing communication, await callback.     Left return contact information  PMT will sign off at this time.  Discussed with Dr. Romualdo  Time: 35   minutes  Detailed review of medical records ( labs,  vital signs), medically appropriate exam ( MS, skin, cardiac,  resp)   discussed with treatment team, counseling and education to patient, family, staff, documenting clinical information, medication management, coordination of care    Ronal Plants NP  Palliative Medicine Team Team Phone # [phone] Pager [phone]

## 2024-05-13 NOTE — TOC Progression Note (Signed)
 Transition of Care [facility]) - Progression Note    Patient Details  Name: Kimyah Frein MRN: [id number] Date of Birth: August 03, 1945  [facility] CM/SW Contact  [name], LCSW Phone Number: 05/13/2024, 9:31 AM  Clinical Narrative:    Per HOP, patient does not meet Hospice facility criteria. CSW checking bed availability for [facility] to see if a rehab authorization can be obtained.    Expected Discharge Plan: SNF Barriers to Discharge: Insurance approval, SNF bed               Expected Discharge Plan and Services In-house Referral: Clinical Social Work   Post Acute Care Choice: Skilled Nursing Facility Living arrangements for the past 2 months: Single Family Home                                       Social Drivers of Health (SDOH) Interventions SDOH Screenings   Food Insecurity: Food Insecurity Present (04/15/2024)  Housing: High Risk (04/22/2024)  Transportation Needs: Unmet Transportation Needs (04/15/2024)  Utilities: At Risk (04/15/2024)  Depression (PHQ2-9): Low Risk (06/04/2021)  Social Connections: Unknown (04/15/2024)  Tobacco Use: Medium Risk (05/10/2024)    Readmission Risk Interventions    01/15/2024    2:24 PM  Readmission Risk Prevention Plan  Transportation Screening Complete  HRI or Home Care Consult Complete  Social Work Consult for Recovery Care Planning/Counseling Complete  Palliative Care Screening Not Applicable  Medication Review Oceanographer) Complete

## 2024-05-14 DIAGNOSIS — N3000 Acute cystitis without hematuria: Secondary | ICD-10-CM | POA: Diagnosis not present

## 2024-05-14 DIAGNOSIS — D696 Thrombocytopenia, unspecified: Secondary | ICD-10-CM | POA: Diagnosis not present

## 2024-05-14 DIAGNOSIS — A419 Sepsis, unspecified organism: Secondary | ICD-10-CM | POA: Diagnosis not present

## 2024-05-14 DIAGNOSIS — N179 Acute kidney failure, unspecified: Secondary | ICD-10-CM | POA: Diagnosis not present

## 2024-05-14 NOTE — Progress Notes (Signed)
 "                        PROGRESS NOTE        PATIENT DETAILS Name: [name] Age: 79 y.o. Sex: female Date of Birth: 1945-05-22 Admit Date: 04/13/2024 Admitting Physician Tamela Stakes, MD ERE:Bzmryzrx, Rojelio PARAS, NP  Brief Summary: Patient is a 79 y.o.  female with history of CVA, COPD, HTN-who was brought to the ED after she was found by her roommate unresponsive (no heat/light-under a single blanket)-she was apparently covered in feces/dried blood-found to be hypothermic/hypotensive/hypoglycemic-she was thought to have undifferentiated shock-acute liver failure-acute kidney injury-admitted to the ICU-stabilized with pressors/D10 infusion/broad-spectrum antibiotics-rapidly improved and subsequently transferred to [facility].  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  Unfortunately-in spite of being on IV antibiotics-other supportive measures-an extended hospitalization-patient did not have any significant clinical improvement-oral intake continues to be erratic and mostly poor-palliative care was involved-after extensive discussion with patient's son-was transition to comfort measures on 12/29.  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 12/8>> ANA: Positive 12/8>> anti-smooth muscle antibody: Negative  Significant microbiology data: 12/6>> COVID/influenza/RSV PCR: Negative 12/6>> blood culture: No growth 12/6>> acute hepatitis serology: Negative 12/6>> CMV DNA PCR: Negative 12/9>> blood culture: No growth  Procedures: Line insertion 05/04/2024  Consults: PCCM GI ID Hematology [name]) Palliative care  Subjective:    No major issue overnight-lying comfortably in bed.  Awaiting SNF placement.  Objective: Vitals: Blood pressure (!) 152/64, pulse 63, temperature 98 F (36.7 C), temperature source Axillary, resp. rate 20, height 5' 10 (1.778 m), weight 79.4 kg, SpO2 97%.   Exam: Sleeping comfortably-not in any distress Easily arouses-pleasantly confused Left-sided hemiparesis.    Assessment/Plan: Goals of care Palliative input greatly appreciated, overall patient noncompliant, does appear with dementia/cognitive issues, does not  have capacity, palliative medicine and APS involved, continues to have very poor oral intake, very poor life quality, significant psychosocial issues, at this point decision has been made to to focus on comfort, and foregoing life-prolonging measures.  Awaiting appropriate disposition-either residential hospice or SNF.  Her oral intake is mostly poor-per nursing staff-she is only eating a small portion of her meals.  Evaluated by hospice-not a candidate for residential hospice-discussed with TOC if we can arrange for SNF with hospice follow-up instead.    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  Due to thrombocytopenia-TEE was not pursued-however it was felt  that since patient would be treated for 6 weeks-TEE would not change management.  PICC line was placed on 12/27.  Given ANA positive status-nonbacterial thrombotic endocarditis is also possible.   Unfortunately-no significant clinical improvement-has been  followed by palliative care services closely-after discussion with patient's son-all antibiotics was discontinued 12/29-with focus mostly on comfort.  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   Treated with 2 doses of IVIG on 12/12 and 12/13 without any significant improvement-subsequently given Nplate  on 12/14 and 12/19-thankfully platelet counts are now obtained during  Normocytic anemia   Likely secondary to critical illness-no evidence of blood loss/GI bleeding Required 2 units of PRBC so far  SVT on 12/15 Required adenosine  Currently stable on beta-blocker TSH stable.    Acute metabolic encephalopathy-likely superimposed on underlying dementia. Hospital delirium Acute metabolic encephalopathy initially felt to be secondary to possible sepsis physiology/liver failure-however continued to have waxing/waning mentation-even after treatment of underlying sepsis physiology/improvement in liver function- at this point is secondary to hospital-acquired delirium-probably superimposed on some amount of undiagnosed dementia.  Evaluated by psychiatry-does not have capacity Started on Remeron  and Seroquel  Has now been transition to full comfort measures.  Hypokalemia Replaced  Hypothermia Likely secondary to environmental exposure (per prior notes- cool room with single blanket-no heating or light) TSH stable Now normothermic after supportive care  Acute liver failure Unclear etiology-possibly shock liver from hypotension from possible sepsis  LFTs/coags improving Dopplers without portal/hepatic vein thrombosis Acute hepatitis serology negative CMV IgM positive but viral  load negative  Negative workup for autoimmune hepatitis completed N-acetylcysteine  12/10 Empirically on lactulose  Gastroenterology input appreciated  Oropharyngeal dysphagia Secondary to critical illness/encephalopathy SLP following-on dysphagia 2 diet.  AKI Hemodynamically mediated due to hypotension Resolved.  Hypokalemia/Hypophosphatemia Repleted  Metabolic acidosis Likely due to delayed clearance of lactic acid-in the setting of liver failure. Improved with supportive care  Self-limited episode-small-volume coffee-ground emesis Hb stable PPI By GI, resolved, no procedures.  Chest pain Likely atypical Resolved  Minimally elevated troponins Trend is flat Likely demand ischemia in the setting of hypotension/critical illness. Echo with stable EF  HTN BP stable, low-dose beta-blocker and Norvasc .  History of chronic HFpEF Status stable  History of CVA Baseline left hemiparesis Antiplatelets/statin held-in the setting of elevated liver enzymes and thrombocytopenia.  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  It is not felt that she has severe failure to thrive syndrome-extremely poor oral intake that is the primary cause of her hypoglycemia.  Unfortunately-in spite of adding Remeron -she has not significantly improved.  See above discussion regarding goals of care-now full comfort measures.  Pressure Ulcer: Agree with assessment and plan as outlined below Wound 04/14/24 0100 Pressure Injury Hip Left Stage  2 -  Partial thickness loss of dermis presenting as a shallow open injury with a red, pink wound bed without slough. (Active)     Wound 04/14/24 0100 Pressure Injury Buttocks Left;Right Unstageable - Full thickness tissue loss in which the base of the injury is covered by slough (yellow, tan, gray, green or brown) and/or eschar (tan, brown or black) in the wound bed. (Active)     Wound 04/14/24 0100 Pressure Injury Sacrum Medial Stage 2 -  Partial thickness loss of dermis presenting as a shallow open injury with a red, pink wound bed without slough. (Active)    Code  status:   Code Status: Do not attempt resuscitation (DNR) - Comfort care   DVT Prophylaxis: SCDs Start: 04/13/24 2344    Family Communication: None at bedside.  Disposition Plan: Status is: Inpatient     Diet: Diet Order             DIET DYS 3 Room service appropriate? Yes; Fluid consistency: Thin  Diet effective now                     Data Review:   Patient Lines/Drains/Airways Status     Active Line/Drains/Airways     Name Placement date Placement time Site Days   PICC Single Lumen 05/04/24 Right Basilic 36 cm 0 cm 05/04/24  8676  Basilic  10   External Urinary Catheter 04/29/24  0553  --  15   Wound 04/14/24 0100 Pressure Injury Hip Left Stage 2 -  Partial thickness loss of dermis presenting as a shallow open injury with a red, pink wound bed without slough. 04/14/24  0100  Hip  30   Wound 04/14/24 0100 Pressure Injury Buttocks Left;Right Unstageable - Full thickness tissue loss in which the base of the injury is covered by slough (yellow, tan, gray, green or brown) and/or eschar (tan, brown or black) in the wound bed. 04/14/24  0100  Buttocks  30   Wound 04/14/24 0100 Pressure Injury Sacrum Medial Stage 2 -  Partial thickness loss of dermis presenting as a shallow open injury with a red, pink wound bed without slough. 04/14/24  0100  Sacrum  30             Inpatient Medications  Scheduled  Meds:  Chlorhexidine  Gluconate Cloth  6 each Topical Daily   feeding supplement  237 mL Oral TID BM   Gerhardt's butt cream   Topical BID   lactulose   20 g Oral BID   mirtazapine   30 mg Oral QHS   mouth rinse  15 mL Mouth Rinse 4 times per day   pantoprazole   40 mg Oral BID   QUEtiapine   25 mg Oral BID   sodium chloride  flush  10-40 mL Intracatheter Q12H   Continuous Infusions:   PRN Meds:.bisacodyl , diphenhydrAMINE , docusate, HYDROcodone -acetaminophen , ipratropium-albuterol , LORazepam , melatonin, ondansetron  (ZOFRAN ) IV, mouth rinse, polyethylene glycol, QUEtiapine , sodium chloride  flush  DVT Prophylaxis  SCDs Start: 04/13/24 2344   Recent Labs  Lab 05/12/24 0328  WBC 4.2  HGB 8.6*  HCT 26.8*  PLT 129*  MCV 98.2  MCH 31.5  MCHC 32.1  RDW 19.0*    Recent Labs  Lab 05/12/24 0328  NA 142  K 3.4*  CL 110  CO2 26  ANIONGAP 6  GLUCOSE 93  BUN 18  CREATININE 0.41*  CALCIUM  8.2*       Recent Labs  Lab 05/12/24 0328  CALCIUM  8.2*     --------------------------------------------------------------------------------------------------------------- Lab Results  Component Value Date   CHOL 288 (H) 05/30/2023   HDL 78 05/30/2023   LDLCALC 165 (H) 05/30/2023   TRIG 248 (H) 05/30/2023   CHOLHDL 3.7 05/30/2023    Lab Results  Component Value Date   HGBA1C 5.3 09/11/2020   No results for input(s): TSH, T4TOTAL, FREET4, T3FREE, THYROIDAB in the last 72 hours.  No results for input(s): VITAMINB12, FOLATE, FERRITIN, TIBC, IRON, RETICCTPCT in the last 72 hours. ------------------------------------------------------------------------------------------------------------------ Cardiac Enzymes No results for input(s): CKMB, TROPONINI, MYOGLOBIN in the last 168 hours.  Invalid input(s): CK  Micro Results No results found for this or  any previous visit (from the past 240 hours).   Radiology Reports  No results  found.    Signature  -   Donalda Applebaum M.D on 05/14/2024 at 10:13 AM   -  To page go to [email or web address]  "

## 2024-05-14 NOTE — Plan of Care (Signed)

## 2024-05-14 NOTE — Plan of Care (Signed)
  Problem: Clinical Measurements: Goal: Will remain free from infection Outcome: Progressing Goal: Respiratory complications will improve Outcome: Progressing Goal: Cardiovascular complication will be avoided Outcome: Progressing   

## 2024-05-14 NOTE — TOC Progression Note (Addendum)
 Transition of Care [facility]) - Progression Note    Patient Details  Name: [name] MRN: [id number] Date of Birth: [birth year]  [facility]) CM/SW Contact  [name], LCSW Phone Number: 05/14/2024, 8:44 AM  Clinical Narrative:    8:44 AM-Still awaiting response from [facility] on bed availability Updated Camilla with APS.   11:12 AM-[facility] does not have beds available. CSW inquiring with [facility].   1:29 PM-[facility] is able to accept pending insurance approval. CSW initiated insurance process, Ref# [id number]. Updated APS.    Expected Discharge Plan: SNF Barriers to Discharge: Insurance approval               Expected Discharge Plan and Services In-house Referral: Clinical Social Work   Post Acute Care Choice: Skilled Nursing Facility Living arrangements for the past 2 months: Single Family Home                                       Social Drivers of Health (SDOH) Interventions SDOH Screenings   Food Insecurity: Food Insecurity Present (04/15/2024)  Housing: High Risk (04/22/2024)  Transportation Needs: Unmet Transportation Needs (04/15/2024)  Utilities: At Risk (04/15/2024)  Depression (PHQ2-9): Low Risk (06/04/2021)  Social Connections: Unknown (04/15/2024)  Tobacco Use: Medium Risk (05/10/2024)    Readmission Risk Interventions    01/15/2024    2:24 PM  Readmission Risk Prevention Plan  Transportation Screening Complete  HRI or Home Care Consult Complete  Social Work Consult for Recovery Care Planning/Counseling Complete  Palliative Care Screening Not Applicable  Medication Review Oceanographer) Complete

## 2024-05-15 DIAGNOSIS — A419 Sepsis, unspecified organism: Secondary | ICD-10-CM | POA: Diagnosis not present

## 2024-05-15 DIAGNOSIS — D696 Thrombocytopenia, unspecified: Secondary | ICD-10-CM | POA: Diagnosis not present

## 2024-05-15 DIAGNOSIS — N179 Acute kidney failure, unspecified: Secondary | ICD-10-CM | POA: Diagnosis not present

## 2024-05-15 DIAGNOSIS — N3000 Acute cystitis without hematuria: Secondary | ICD-10-CM | POA: Diagnosis not present

## 2024-05-15 NOTE — Progress Notes (Signed)
 Patient is for SNF placement

## 2024-05-15 NOTE — Plan of Care (Signed)

## 2024-05-15 NOTE — Progress Notes (Signed)
 Physical Therapy Treatment Patient Details Name: [name] MRN: [id number] DOB: 1946/01/29 Today's Date: 05/15/2024   History of Present Illness Pt is a 79 y.o. F who presented to [facility] ED 04/13/24 via EMS after she was found by her roommate unresponsive (no heat/light-under a single blanket) covered feces/dried blood. Found to be hypothermic, hypotensive, hypoglycemic; thought to have undifferentiated shock, acute liver failure, AKI. Required ICU for stabilization. UA positive. 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 PMHx: HTN, HLD, CVA left hemiparesis, CAPD, DVT, IVH.    PT Comments  Pt greeted supine in bed, agreeable to PT session with moderate encouragement. Educated pt on importance of participating with therapy to reduce the negative effects associated with prolonged bed rest. Pt completed bed mobility with maxA. She engaged in static seated balance for ~5 minutes. Pt demonstrated a posterior and right lateral lean requiring modA to stabilize. She was able to briefly maintain neutral posture with minA for <15 seconds. Pt continues to require +2 assist for OOB. Trialed using stedy for sit<>stand. Pt unable to achieve upright erect posture. She demonstrated forward lean and B knee flex. Pt will benefit from continued inpatient follow up therapy, <3 hours/day.     If plan is discharge home, recommend the following: Two people to help with walking and/or transfers;A lot of help with bathing/dressing/bathroom;Supervision due to cognitive status;Direct supervision/assist for medications management;Direct supervision/assist for financial management;Assistance with feeding;Assistance with cooking/housework;Assist for transportation;Help with stairs or ramp for  entrance   Can travel by private vehicle     No  Equipment Recommendations  Hospital bed;Hoyer lift    Recommendations for Other Services       Precautions / Restrictions Precautions Precautions: Fall Recall of Precautions/Restrictions: Impaired Restrictions Weight Bearing Restrictions Per Provider Order: No     Mobility  Bed Mobility Overal bed mobility: Needs Assistance Bed Mobility: Rolling, Sidelying to Sit, Sit to Sidelying Rolling: Mod assist, Used rails Sidelying to sit: Max assist, HOB elevated, Used rails     Sit to sidelying: Max assist, HOB elevated, Used rails General bed mobility comments: Pt sat up on R side of bed with increased time. Cued sequencing. Assist to roll pt into right sidelying, bring BLE off EOB, and elevate trunk. Pt heavily relied on bed rail with RUE. Returning to bed pt guided trunk down, assist to bring BLE back in. Repositioned using bed features, bed pad, and +2 assist.    Transfers Overall transfer level: Needs assistance Equipment used: Ambulation equipment used Transfers: Sit to/from Stand Sit to Stand: Max assist, +2 physical assistance, From elevated surface, Via lift equipment           General transfer comment: Pt stood from raised bed height using stedy. Assist to placed LLE on foot plate. Pt c/o pain when LUE was supported on crossbard, preferred to leave in her lap. Guided pt's RUE onto cross bar. Pt attempted to stand with maxA x2. She demonstrated trunk flex and maintained BLE knee flex. Cued upright posture with emphasis on upright posture, hip ext, and improve quad activation. Pt was unable to achieve an upright erect neutral posture despite +2 assist. She quickly fatigued and c/o pain in L knee at anterior block provided by stedy. Fair eccentric control. Transfer via Lift Equipment: Stedy  Ambulation/Gait  General Gait Details: Unable   Stairs             Wheelchair Mobility     Tilt Bed     Modified Rankin (Stroke Patients Only)       Balance Overall balance assessment: Needs assistance Sitting-balance support: Single extremity supported, Feet supported Sitting balance-Leahy Scale: Poor Sitting balance - Comments: Pt sat EOB with min-modA to maintain static seated posture. She frequently displayed right lateral lean. Cues for upright posture. Pt briefly able to maintain neutral position, but quickly fatiguing and heavy reliance on bed rail with RUE. Postural control: Right lateral lean, Posterior lean Standing balance support: Bilateral upper extremity supported, During functional activity Standing balance-Leahy Scale: Zero Standing balance comment: maxA +2, reliant on OT/PT support in static stance lacking fully upright posture                            Communication Communication Communication: Impaired Factors Affecting Communication: Hearing impaired  Cognition Arousal: Alert Behavior During Therapy: Flat affect   PT - Cognitive impairments: No family/caregiver present to determine baseline, Orientation, Awareness, Memory, Attention, Initiation, Sequencing, Problem solving, Safety/Judgement   Orientation impairments: Place, Time, Situation                   PT - Cognition Comments: Pt with limited engagement and cooperation with PT treatment. Educated pt on the importance of participating with therapy to help reduce the negative effects of prolonged bed rest. Following commands: Impaired Following commands impaired: Only follows one step commands consistently, Follows one step commands with increased time    Cueing Cueing Techniques: Verbal cues, Tactile cues, Visual cues  Exercises      General Comments General comments (skin integrity, edema, etc.): NT provided +2 assist when sit<>stand attempt. NT present planning to bath pt at end of session.      Pertinent Vitals/Pain Pain Assessment Pain Assessment: Faces Faces Pain Scale: Hurts  little more Pain Location: L hip and arm; Abdomen d/t constipation Pain Descriptors / Indicators: Discomfort, Pressure, Grimacing Pain Intervention(s): Premedicated before session, Monitored during session, Limited activity within patient's tolerance, Repositioned    Home Living                          Prior Function            PT Goals (current goals can now be found in the care plan section) Acute Rehab PT Goals Patient Stated Goal: Have less pain Time For Goal Achievement: 05/26/24 Potential to Achieve Goals: Fair Progress towards PT goals: Progressing toward goals    Frequency    Min 1X/week      PT Plan      Co-evaluation              AM-PAC PT 6 Clicks Mobility   Outcome Measure  Help needed turning from your back to your side while in a flat bed without using bedrails?: A Lot Help needed moving from lying on your back to sitting on the side of a flat bed without using bedrails?: A Lot Help needed moving to and from a bed to a chair (including a wheelchair)?: Total Help needed standing up from a chair using your arms (e.g., wheelchair or bedside chair)?: Total Help needed to walk in hospital room?: Total Help needed climbing 3-5 steps with a railing? : Total 6 Click Score: 8    End of  Session Equipment Utilized During Treatment: Gait belt Activity Tolerance: Patient limited by fatigue Patient left: in bed;with call bell/phone within reach;with bed alarm set;with nursing/sitter in room Nurse Communication: Mobility status;Need for lift equipment (Maximove) PT Visit Diagnosis: Unsteadiness on feet (R26.81);Muscle weakness (generalized) (M62.81);Other abnormalities of gait and mobility (R26.89)     Time: 8555-8540 PT Time Calculation (min) (ACUTE ONLY): 15 min  Charges:    $Therapeutic Activity: 8-22 mins PT General Charges $$ ACUTE PT VISIT: 1 Visit                     Randall SAUNDERS, PT, DPT Acute Rehabilitation Services Office:  [phone] Secure Chat Preferred  Delon CHRISTELLA Callander 05/15/2024, 3:23 PM

## 2024-05-15 NOTE — TOC Progression Note (Addendum)
 Transition of Care [facility]) - Progression Note    Patient Details  Name: [name] MRN: [id number] Date of Birth: 1945-06-19  Transition of Care [facility]) CM/SW Contact  [name], LCSW Phone Number: 05/15/2024, 8:45 AM  Clinical Narrative:    8:45 AM-Insurance still pending for [facility].    3:35 PM-Insurance approval still pending.   Expected Discharge Plan: Skilled Nursing Facility Barriers to Discharge: Insurance Authorization               Expected Discharge Plan and Services In-house Referral: Clinical Social Work   Post Acute Care Choice: Skilled Nursing Facility Living arrangements for the past 2 months: Single Family Home                                       Social Drivers of Health (SDOH) Interventions SDOH Screenings   Food Insecurity: Food Insecurity Present (04/15/2024)  Housing: High Risk (04/22/2024)  Transportation Needs: Unmet Transportation Needs (04/15/2024)  Utilities: At Risk (04/15/2024)  Depression (PHQ2-9): Low Risk (06/04/2021)  Social Connections: Unknown (04/15/2024)  Tobacco Use: Medium Risk (05/10/2024)    Readmission Risk Interventions    01/15/2024    2:24 PM  Readmission Risk Prevention Plan  Transportation Screening Complete  HRI or Home Care Consult Complete  Social Work Consult for Recovery Care Planning/Counseling Complete  Palliative Care Screening Not Applicable  Medication Review Oceanographer) Complete

## 2024-05-15 NOTE — Progress Notes (Signed)
 "                        PROGRESS NOTE        PATIENT DETAILS Name: [name] Age: 79 y.o. Sex: female Date of Birth: 16-May-1945 Admit Date: 04/13/2024 Admitting Physician Tamela Stakes, MD ERE:Bzmryzrx, Rojelio PARAS, NP  Brief Summary: Patient is a 79 y.o.  female with history of CVA, COPD, HTN-who was brought to the ED after she was found by her roommate unresponsive (no heat/light-under a single blanket)-she was apparently covered in feces/dried blood-found to be hypothermic/hypotensive/hypoglycemic-she was thought to have undifferentiated shock-acute liver failure-acute kidney injury-admitted to the ICU-stabilized with pressors/D10 infusion/broad-spectrum antibiotics-rapidly improved and subsequently transferred to [facility].  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  Unfortunately-in spite of being on IV antibiotics-other supportive measures-an extended hospitalization-patient did not have any significant clinical improvement-oral intake continues to be erratic and mostly poor-palliative care was involved-after extensive discussion with patient's son-was transition to comfort measures on 12/29.  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 12/8>> ANA: Positive 12/8>> anti-smooth muscle antibody: Negative  Significant microbiology data: 12/6>> COVID/influenza/RSV PCR: Negative 12/6>> blood culture: No growth 12/6>> acute hepatitis serology: Negative 12/6>> CMV DNA PCR: Negative 12/9>> blood culture: No growth  Procedures: Line insertion 05/04/2024  Consults: PCCM GI ID Hematology [name]) Palliative care  Subjective:    No major issues overnight-appears unchanged-awaiting placement.  Objective: Vitals: Blood pressure (!) 152/64, pulse 63, temperature 97.6 F (36.4 C), temperature source Axillary, resp. rate 18, height 5' 10 (1.778 m), weight 79.4 kg, SpO2 97%.   Exam: Sleeping comfortably-not in any distress Easily arouses-pleasantly confused Left-sided hemiparesis.    Assessment/Plan: Goals of care Palliative input greatly appreciated, overall patient noncompliant, does appear with dementia/cognitive issues, does not  have capacity, palliative medicine and APS involved, continues to have very poor oral intake, very poor life quality, significant psychosocial issues, at this point decision has been made to to focus on comfort, and foregoing life-prolonging measures.  Awaiting appropriate disposition-either residential hospice or SNF.  Her oral intake is mostly poor-per nursing staff-she is only eating a small portion of her meals.  Evaluated by hospice-not a candidate for residential hospice-discussed with TOC if we can arrange for SNF with hospice follow-up instead.    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  Due to thrombocytopenia-TEE was not pursued-however it was felt that since  patient would be treated for 6 weeks-TEE would not change management.  PICC line was placed on 12/27.  Given ANA positive status-nonbacterial thrombotic endocarditis is also possible.   Unfortunately-no significant clinical improvement-has been followed by palliative care services  closely-after discussion with patient's son-all antibiotics was discontinued 12/29-with focus mostly on comfort.  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   Treated with 2 doses of IVIG on 12/12 and 12/13 without any significant improvement-subsequently given Nplate  on 12/14 and 12/19-thankfully platelet counts are now obtained during  Normocytic anemia   Likely secondary to critical illness-no evidence of blood loss/GI bleeding Required 2 units of PRBC so far  SVT on 12/15 Required adenosine  Currently stable on beta-blocker TSH stable.    Acute metabolic encephalopathy-likely superimposed on underlying dementia. Hospital delirium Acute metabolic encephalopathy initially felt to be secondary to possible sepsis physiology/liver failure-however continued to have waxing/waning mentation-even after treatment of underlying sepsis physiology/improvement in liver function- at this point is secondary to hospital-acquired delirium-probably superimposed on some amount of undiagnosed dementia.  Evaluated by psychiatry-does not have capacity Started on Remeron  and Seroquel  Has now been transition to full comfort measures.  Hypokalemia Replaced  Hypothermia Likely secondary to environmental exposure (per prior notes- cool room with single blanket-no heating or light) TSH stable Now normothermic after supportive care  Acute liver failure Unclear etiology-possibly shock liver from hypotension from possible sepsis  LFTs/coags improving Dopplers without portal/hepatic vein thrombosis Acute hepatitis serology negative CMV IgM positive but viral load  negative  Negative workup for autoimmune hepatitis completed N-acetylcysteine  12/10 Empirically on lactulose  Gastroenterology input appreciated  Oropharyngeal dysphagia Secondary to critical illness/encephalopathy SLP following-on dysphagia 2 diet.  AKI Hemodynamically mediated due to hypotension Resolved.  Hypokalemia/Hypophosphatemia Repleted  Metabolic acidosis Likely due to delayed clearance of lactic acid-in the setting of liver failure. Improved with supportive care  Self-limited episode-small-volume coffee-ground emesis Hb stable PPI By GI, resolved, no procedures.  Chest pain Likely atypical Resolved  Minimally elevated troponins Trend is flat Likely demand ischemia in the setting of hypotension/critical illness. Echo with stable EF  HTN BP stable, low-dose beta-blocker and Norvasc .  History of chronic HFpEF Status stable  History of CVA Baseline left hemiparesis Antiplatelets/statin held-in the setting of elevated liver enzymes and thrombocytopenia.  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  It is not felt that she has severe failure to thrive syndrome-extremely poor oral intake that is the primary cause of her hypoglycemia.  Unfortunately-in spite of adding Remeron -she has not significantly improved.  See above discussion regarding goals of care-now full comfort measures.  Pressure Ulcer: Agree with assessment and plan as outlined below Wound 04/14/24 0100 Pressure Injury Hip Left Stage 2 -   Partial thickness loss of dermis presenting as a shallow open injury with a red, pink wound bed without slough. (Active)     Wound 04/14/24 0100 Pressure Injury Buttocks Left;Right Unstageable - Full thickness tissue loss in which the base of the injury is covered by slough (yellow, tan, gray, green or brown) and/or eschar (tan, brown or black) in the wound bed. (Active)     Wound 04/14/24 0100 Pressure Injury Sacrum Medial Stage 2 -  Partial thickness loss of dermis presenting as a shallow open injury with a red, pink wound bed without slough. (Active)    Code status:   Code Status:  Do not attempt resuscitation (DNR) - Comfort care   DVT Prophylaxis: SCDs Start: 04/13/24 2344    Family Communication: None at bedside.  Disposition Plan: Status is: Inpatient     Diet: Diet Order             DIET DYS 3 Room service appropriate? Yes; Fluid consistency: Thin  Diet effective now                     Data Review:   Patient Lines/Drains/Airways Status     Active Line/Drains/Airways     Name Placement date Placement time Site Days   PICC Single Lumen 05/04/24 Right Basilic 36 cm 0 cm 05/04/24  8676  Basilic  11   External Urinary Catheter 04/29/24  0553  --  16   Wound 04/14/24 0100 Pressure Injury Hip Left Stage 2 -  Partial thickness loss of dermis presenting as a shallow open injury with a red, pink wound bed without slough. 04/14/24  0100  Hip  31   Wound 04/14/24 0100 Pressure Injury Buttocks Left;Right Unstageable - Full thickness tissue loss in which the base of the injury is covered by slough (yellow, tan, gray, green or brown) and/or eschar (tan, brown or black) in the wound bed. 04/14/24  0100  Buttocks  31   Wound 04/14/24 0100 Pressure Injury Sacrum Medial Stage 2 -  Partial thickness loss of dermis presenting as a shallow open injury with a red, pink wound bed without slough. 04/14/24  0100  Sacrum  31             Inpatient Medications  Scheduled Meds:   Chlorhexidine  Gluconate Cloth  6 each Topical Daily   feeding supplement  237 mL Oral TID BM   Gerhardt's butt cream   Topical BID   lactulose   20 g Oral BID   mirtazapine   30 mg Oral QHS   mouth rinse  15 mL Mouth Rinse 4 times per day   pantoprazole   40 mg Oral BID   QUEtiapine   25 mg Oral BID   sodium chloride  flush  10-40 mL Intracatheter Q12H   Continuous Infusions:   PRN Meds:.bisacodyl , diphenhydrAMINE , docusate, HYDROcodone -acetaminophen , ipratropium-albuterol , LORazepam , melatonin, ondansetron  (ZOFRAN ) IV, mouth rinse, polyethylene glycol, QUEtiapine , sodium chloride  flush  DVT Prophylaxis  SCDs Start: 04/13/24 2344   Recent Labs  Lab 05/12/24 0328  WBC 4.2  HGB 8.6*  HCT 26.8*  PLT 129*  MCV 98.2  MCH 31.5  MCHC 32.1  RDW 19.0*    Recent Labs  Lab 05/12/24 0328  NA 142  K 3.4*  CL 110  CO2 26  ANIONGAP 6  GLUCOSE 93  BUN 18  CREATININE 0.41*  CALCIUM  8.2*       Recent Labs  Lab 05/12/24 0328  CALCIUM  8.2*     --------------------------------------------------------------------------------------------------------------- Lab Results  Component Value Date   CHOL 288 (H) 05/30/2023   HDL 78 05/30/2023   LDLCALC 165 (H) 05/30/2023   TRIG 248 (H) 05/30/2023   CHOLHDL 3.7 05/30/2023    Lab Results  Component Value Date   HGBA1C 5.3 09/11/2020   No results for input(s): TSH, T4TOTAL, FREET4, T3FREE, THYROIDAB in the last 72 hours.  No results for input(s): VITAMINB12, FOLATE, FERRITIN, TIBC, IRON, RETICCTPCT in the last 72 hours. ------------------------------------------------------------------------------------------------------------------ Cardiac Enzymes No results for input(s): CKMB, TROPONINI, MYOGLOBIN in the last 168 hours.  Invalid input(s): CK  Micro Results No results found for this or any previous visit (from the  past 240 hours).   Radiology Reports  No results found.    Signature  -    Donalda Applebaum M.D on 05/15/2024 at 1:45 PM   -  To page go to [email or web address]  "

## 2024-05-16 DIAGNOSIS — N179 Acute kidney failure, unspecified: Secondary | ICD-10-CM | POA: Diagnosis not present

## 2024-05-16 DIAGNOSIS — K7200 Acute and subacute hepatic failure without coma: Secondary | ICD-10-CM | POA: Diagnosis not present

## 2024-05-16 DIAGNOSIS — R410 Disorientation, unspecified: Secondary | ICD-10-CM | POA: Diagnosis not present

## 2024-05-16 DIAGNOSIS — I330 Acute and subacute infective endocarditis: Secondary | ICD-10-CM | POA: Diagnosis not present

## 2024-05-16 DIAGNOSIS — R6521 Severe sepsis with septic shock: Secondary | ICD-10-CM | POA: Diagnosis not present

## 2024-05-16 DIAGNOSIS — A419 Sepsis, unspecified organism: Secondary | ICD-10-CM | POA: Diagnosis not present

## 2024-05-16 NOTE — TOC Progression Note (Addendum)
 Transition of Care [facility]) - Progression Note    Patient Details  Name: [name] MRN: [id number] Date of Birth: 05-14-1945  Transition of Care [facility]) CM/SW Contact  [name], LCSW Phone Number: 05/16/2024, 8:37 AM  Clinical Narrative:    8:37 AM-Insurance authorization still pending for [facility]. Camilla with APS is filing paperwork for temporary Guardianship as she has been unable to reach patient's son.  12pm-CSW received request from insurance for Peer to Peer. MD unable to provide rehab argument as comfort really is more appropriate for patient. [facility] will conduct an asset check to see if they can accept patient under long term with her Medicaid.     Expected Discharge Plan: Skilled Nursing Facility Barriers to Discharge: Insurance Authorization               Expected Discharge Plan and Services In-house Referral: Clinical Social Work   Post Acute Care Choice: Skilled Nursing Facility Living arrangements for the past 2 months: Single Family Home                                       Social Drivers of Health (SDOH) Interventions SDOH Screenings   Food Insecurity: Food Insecurity Present (04/15/2024)  Housing: High Risk (04/22/2024)  Transportation Needs: Unmet Transportation Needs (04/15/2024)  Utilities: At Risk (04/15/2024)  Depression (PHQ2-9): Low Risk (06/04/2021)  Social Connections: Unknown (04/15/2024)  Tobacco Use: Medium Risk (05/10/2024)    Readmission Risk Interventions    01/15/2024    2:24 PM  Readmission Risk Prevention Plan  Transportation Screening Complete  HRI or Home Care Consult Complete  Social Work Consult for Recovery Care Planning/Counseling Complete  Palliative Care Screening Not Applicable  Medication Review Ocean[employer]) Complete

## 2024-05-16 NOTE — Progress Notes (Signed)
 Occupational Therapy Treatment Patient Details Name: [name] MRN: [id number] DOB: Oct 03, 1945 Today's Date: 05/16/2024   History of present illness Pt is a 79 y.o. F who presented to [facility] ED 04/13/24 via EMS after she was found by her roommate unresponsive (no heat/light-under a single blanket) covered feces/dried blood. Found to be hypothermic, hypotensive, hypoglycemic; thought to have undifferentiated shock, acute liver failure, AKI. Required ICU for stabilization. UA positive. 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 PMHx: HTN, HLD, CVA left hemiparesis, CAPD, DVT, IVH.   OT comments  Pt seen for OT treatment this PM, received in supine, alone in room. Flat affect, delayed processing, but willing to participate. She remains at min A level for self-feeding with HOB upright. Assisted in bed level repositioning with max A for optimal eating. Tolerated simple UE exercises as detailed below. Given pt's current deficits, continue to recommend post-acute rehab <3 hours/day. OT to continue to follow and progress as able.      If plan is discharge home, recommend the following:  Two people to help with bathing/dressing/bathroom;Two people to help with walking and/or transfers;Assistance with feeding;Direct supervision/assist for financial management;Assistance with cooking/housework;Direct supervision/assist for medications management;Supervision due to cognitive status   Equipment Recommendations  Other (comment) (defer)    Recommendations for Other Services      Precautions / Restrictions Precautions Precautions: Fall Recall of Precautions/Restrictions: Impaired Precaution/Restrictions Comments: poor safety awareness, impaired  cognition Restrictions Weight Bearing Restrictions Per Provider Order: No       Mobility Bed Mobility Overal bed mobility: Needs Assistance             General bed mobility comments: partial rolling to the L side with mod A, verbal cues to reach for bed rail    Transfers                   General transfer comment: not assessed     Balance                                           ADL either performed or assessed with clinical judgement   ADL Overall ADL's : Needs assistance/impaired Eating/Feeding: Minimal assistance;Bed level Eating/Feeding Details (indicate cue type and reason): assist to open containers/prep food, brings forkful of food to mouth with min spillage, HOB > 40*                                        Extremity/Trunk Assessment Upper Extremity Assessment Upper Extremity Assessment: Generalized weakness (residual LUE paresis from prior CVA)            Vision       Perception     Praxis     Communication Communication Communication: Impaired Factors Affecting Communication: Hearing impaired   Cognition Arousal: Alert Behavior During Therapy: Flat affect               OT - Cognition Comments: delayed processing, could also be impacted by hearing deficits                 Following commands: Impaired Following commands impaired:  Only follows one step commands consistently, Follows one step commands with increased time      Cueing   Cueing Techniques: Verbal cues, Tactile cues, Visual cues  Exercises Exercises: General Upper Extremity General Exercises - Upper Extremity Shoulder Horizontal ABduction: AROM, Right, 5 reps, Supine (crossing midline with trunk rotation to facilitate bed rail reaching in prepration for bed mobility) Elbow Flexion: AAROM, AROM, Both, 10 reps, Supine (AAROM LUE, AROM RUE) Elbow Extension: AAROM, AROM, Both, 10 reps, Supine (AAROM LUE, AROM RUE)    Shoulder  Instructions       General Comments overall tolerated session well    Pertinent Vitals/ Pain       Pain Assessment Pain Assessment: Faces Faces Pain Scale: Hurts a little bit Pain Location: generalized Pain Descriptors / Indicators: Discomfort Pain Intervention(s): Limited activity within patient's tolerance, Monitored during session  Home Living                                          Prior Functioning/Environment              Frequency  Min 1X/week        Progress Toward Goals  OT Goals(current goals can now be found in the care plan section)  Progress towards OT goals: Progressing toward goals     Plan      Co-evaluation                 AM-PAC OT 6 Clicks Daily Activity     Outcome Measure   Help from another person eating meals?: A Little Help from another person taking care of personal grooming?: A Lot Help from another person toileting, which includes using toliet, bedpan, or urinal?: Total Help from another person bathing (including washing, rinsing, drying)?: A Lot Help from another person to put on and taking off regular upper body clothing?: Total Help from another person to put on and taking off regular lower body clothing?: Total 6 Click Score: 10    End of Session    OT Visit Diagnosis: Other abnormalities of gait and mobility (R26.89);Muscle weakness (generalized) (M62.81);Hemiplegia and hemiparesis;Pain Hemiplegia - Right/Left: Left Hemiplegia - dominant/non-dominant: Non-Dominant   Activity Tolerance Patient tolerated treatment well   Patient Left in bed;with call bell/phone within reach;with bed alarm set   Nurse Communication Mobility status        Time: 8672-8655 OT Time Calculation (min): 17 min  Charges: OT General Charges $OT Visit: 1 Visit OT Treatments $Therapeutic Exercise: 8-22 mins  London Nonaka M. [place], OTR/L [facility] Acute Rehabilitation Services [phone] Secure Chat  Preferred  Kiyoto Slomski 05/16/2024, 3:46 PM

## 2024-05-16 NOTE — Plan of Care (Signed)
  Problem: Pain Managment: Goal: General experience of comfort will improve and/or be controlled Outcome: Progressing

## 2024-05-16 NOTE — Progress Notes (Signed)
 "                        PROGRESS NOTE        PATIENT DETAILS Name: [name] Age: 79 y.o. Sex: female Date of Birth: March 30, 1946 Admit Date: 04/13/2024 Admitting Physician Tamela Stakes, MD ERE:Bzmryzrx, Rojelio PARAS, NP  Brief Summary: Patient is a 79 y.o.  female with history of CVA, COPD, HTN-who was brought to the ED after she was found by her roommate unresponsive (no heat/light-under a single blanket)-she was apparently covered in feces/dried blood-found to be hypothermic/hypotensive/hypoglycemic-she was thought to have undifferentiated shock-acute liver failure-acute kidney injury-admitted to the ICU-stabilized with pressors/D10 infusion/broad-spectrum antibiotics-rapidly improved and subsequently transferred to [facility].  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  Unfortunately-in spite of being on IV antibiotics-other supportive measures-an extended hospitalization-patient did not have any significant clinical improvement-oral intake continues to be erratic and mostly poor-palliative care was involved-after extensive discussion with patient's son-was transition to comfort measures on 12/29.  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 12/8>> ANA: Positive 12/8>> anti-smooth muscle antibody: Negative  Significant microbiology data: 12/6>> COVID/influenza/RSV PCR: Negative 12/6>> blood culture: No growth 12/6>> acute hepatitis serology: Negative 12/6>> CMV DNA PCR: Negative 12/9>> blood culture: No growth  Procedures: Line insertion 05/04/2024  Consults: PCCM GI ID Hematology [name]) Palliative care  Subjective:    No major issues-lying comfortably in bed.  Objective: Vitals: Blood pressure 118/61, pulse 64, temperature 98.6 F (37 C), temperature source Axillary, resp. rate 17, height 5' 10 (1.778 m), weight 79.4 kg, SpO2 96%.   Exam: Sleeping comfortably-not in any distress Easily arouses-pleasantly confused Left-sided hemiparesis.    Assessment/Plan: Goals of care Palliative input greatly appreciated, overall patient noncompliant, does appear with dementia/cognitive issues, does not  have capacity, palliative medicine and APS involved, continues to have very poor oral intake, very poor life quality, significant psychosocial issues, at this point decision has been made to to focus on comfort, and foregoing life-prolonging measures.  Awaiting appropriate disposition-either residential hospice or SNF.  Her oral intake is mostly poor-per nursing staff-she is only eating a small portion of her meals.  Evaluated by hospice-not a candidate for residential hospice-discussed with TOC if we can arrange for SNF with hospice follow-up instead.    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  Due to thrombocytopenia-TEE was not pursued-however it was felt that since patient would be treated  for 6 weeks-TEE would not change management.  PICC line was placed on 12/27.  Given ANA positive status-nonbacterial thrombotic endocarditis is also possible.   Unfortunately-no significant clinical improvement-has been followed by palliative care services closely-after  discussion with patient's son-all antibiotics was discontinued 12/29-with focus mostly on comfort.  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   Treated with 2 doses of IVIG on 12/12 and 12/13 without any significant improvement-subsequently given Nplate  on 12/14 and 12/19-thankfully platelet counts are now obtained during  Normocytic anemia   Likely secondary to critical illness-no evidence of blood loss/GI bleeding Required 2 units of PRBC so far  SVT on 12/15 Required adenosine  Currently stable on beta-blocker TSH stable.    Acute metabolic encephalopathy-likely superimposed on underlying dementia. Hospital delirium Acute metabolic encephalopathy initially felt to be secondary to possible sepsis physiology/liver failure-however continued to have waxing/waning mentation-even after treatment of underlying sepsis physiology/improvement in liver function- at this point is secondary to hospital-acquired delirium-probably superimposed on some amount of undiagnosed dementia.  Evaluated by psychiatry-does not have capacity Started on Remeron  and Seroquel  Has now been transition to full comfort measures.  Hypokalemia Replaced  Hypothermia Likely secondary to environmental exposure (per prior notes- cool room with single blanket-no heating or light) TSH stable Now normothermic after supportive care  Acute liver failure Unclear etiology-possibly shock liver from hypotension from possible sepsis  LFTs/coags improving Dopplers without portal/hepatic vein thrombosis Acute hepatitis serology negative CMV IgM positive but viral load negative  Negative workup for  autoimmune hepatitis completed N-acetylcysteine  12/10 Empirically on lactulose  Gastroenterology input appreciated  Oropharyngeal dysphagia Secondary to critical illness/encephalopathy SLP following-on dysphagia 2 diet.  AKI Hemodynamically mediated due to hypotension Resolved.  Hypokalemia/Hypophosphatemia Repleted  Metabolic acidosis Likely due to delayed clearance of lactic acid-in the setting of liver failure. Improved with supportive care  Self-limited episode-small-volume coffee-ground emesis Hb stable PPI By GI, resolved, no procedures.  Chest pain Likely atypical Resolved  Minimally elevated troponins Trend is flat Likely demand ischemia in the setting of hypotension/critical illness. Echo with stable EF  HTN BP stable, low-dose beta-blocker and Norvasc .  History of chronic HFpEF Status stable  History of CVA Baseline left hemiparesis Antiplatelets/statin held-in the setting of elevated liver enzymes and thrombocytopenia.  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  It is not felt that she has severe failure to thrive syndrome-extremely poor oral intake that is the primary cause of her hypoglycemia.  Unfortunately-in spite of adding Remeron -she has not significantly improved.  See above discussion regarding goals of care-now full comfort measures.  Pressure Ulcer: Agree with assessment and plan as outlined below Wound 04/14/24 0100 Pressure Injury Hip Left Stage 2 -  Partial thickness loss of  dermis presenting as a shallow open injury with a red, pink wound bed without slough. (Active)     Wound 04/14/24 0100 Pressure Injury Buttocks Left;Right Unstageable - Full thickness tissue loss in which the base of the injury is covered by slough (yellow, tan, gray, green or brown) and/or eschar (tan, brown or black) in the wound bed. (Active)     Wound 04/14/24 0100 Pressure Injury Sacrum Medial Stage 2 -  Partial thickness loss of dermis presenting as a shallow open injury with a red, pink wound bed without slough. (Active)    Code status:   Code Status: Limited:  Do not attempt resuscitation (DNR) -DNR-LIMITED -Do Not Intubate/DNI    DVT Prophylaxis: SCDs Start: 04/13/24 2344    Family Communication: None at bedside.  Disposition Plan: Status is: Inpatient     Diet: Diet Order             DIET DYS 3 Room service appropriate? Yes; Fluid consistency: Thin  Diet effective now                     Data Review:   Patient Lines/Drains/Airways Status     Active Line/Drains/Airways     Name Placement date Placement time Site Days   PICC Single Lumen 05/04/24 Right Basilic 36 cm 0 cm 05/04/24  8676  Basilic  12   External Urinary Catheter 04/29/24  0553  --  17   Wound 04/14/24 0100 Pressure Injury Hip Left Stage 2 -  Partial thickness loss of dermis presenting as a shallow open injury with a red, pink wound bed without slough. 04/14/24  0100  Hip  32   Wound 04/14/24 0100 Pressure Injury Buttocks Left;Right Unstageable - Full thickness tissue loss in which the base of the injury is covered by slough (yellow, tan, gray, green or brown) and/or eschar (tan, brown or black) in the wound bed. 04/14/24  0100  Buttocks  32   Wound 04/14/24 0100 Pressure Injury Sacrum Medial Stage 2 -  Partial thickness loss of dermis presenting as a shallow open injury with a red, pink wound bed without slough. 04/14/24  0100  Sacrum  32             Inpatient Medications  Scheduled  Meds:  Chlorhexidine  Gluconate Cloth  6 each Topical Daily   feeding supplement  237 mL Oral TID BM   Gerhardt's butt cream   Topical BID   lactulose   20 g Oral BID   mirtazapine   30 mg Oral QHS   mouth rinse  15 mL Mouth Rinse 4 times per day   pantoprazole   40 mg Oral BID   QUEtiapine   25 mg Oral BID   sodium chloride  flush  10-40 mL Intracatheter Q12H   Continuous Infusions:   PRN Meds:.bisacodyl , diphenhydrAMINE , docusate, HYDROcodone -acetaminophen , ipratropium-albuterol , LORazepam , melatonin, ondansetron  (ZOFRAN ) IV, mouth rinse, polyethylene glycol, QUEtiapine , sodium chloride  flush  DVT Prophylaxis  SCDs Start: 04/13/24 2344   Recent Labs  Lab 05/12/24 0328  WBC 4.2  HGB 8.6*  HCT 26.8*  PLT 129*  MCV 98.2  MCH 31.5  MCHC 32.1  RDW 19.0*    Recent Labs  Lab 05/12/24 0328  NA 142  K 3.4*  CL 110  CO2 26  ANIONGAP 6  GLUCOSE 93  BUN 18  CREATININE 0.41*  CALCIUM  8.2*       Recent Labs  Lab 05/12/24 0328  CALCIUM  8.2*     --------------------------------------------------------------------------------------------------------------- Lab Results  Component Value Date   CHOL 288 (H) 05/30/2023   HDL 78 05/30/2023   LDLCALC 165 (H) 05/30/2023   TRIG 248 (H) 05/30/2023   CHOLHDL 3.7 05/30/2023    Lab Results  Component Value Date   HGBA1C 5.3 09/11/2020   No results for input(s): TSH, T4TOTAL, FREET4, T3FREE, THYROIDAB in the last 72 hours.  No results for input(s): VITAMINB12, FOLATE, FERRITIN, TIBC, IRON, RETICCTPCT in the last 72 hours. ------------------------------------------------------------------------------------------------------------------ Cardiac Enzymes No results for input(s): CKMB, TROPONINI, MYOGLOBIN in the last 168 hours.  Invalid input(s): CK  Micro Results No results found for this or any previous visit (  from the past 240 hours).   Radiology Reports  No results  found.    Signature  -   Donalda Applebaum M.D on 05/16/2024 at 1:52 PM   -  To page go to [email or web address]  "

## 2024-05-17 DIAGNOSIS — K7200 Acute and subacute hepatic failure without coma: Secondary | ICD-10-CM | POA: Diagnosis not present

## 2024-05-17 NOTE — TOC Progression Note (Signed)
 Transition of Care [facility]) - Progression Note    Patient Details  Name: Ashlyn Cabler MRN: [id number] Date of Birth: Jul 11, 1945  [facility] CM/SW Contact  [name], LCSW Phone Number: 05/17/2024, 2:21 PM  Clinical Narrative:    Leonidas reports that patient is over assets for [facility] so they are unable to accept her without a payer source. CSW updated Camilla with [facility]PS who is filing for [employer].     Expected Discharge Plan: Skilled Nursing Facility Barriers to Discharge: Insurance Authorization               Expected Discharge Plan and Services In-house Referral: Clinical Social Work   Post Acute Care Choice: Skilled Nursing Facility Living arrangements for the past 2 months: Single Family Home                                       Social Drivers of Health (SDOH) Interventions SDOH Screenings   Food Insecurity: Food Insecurity Present (04/15/2024)  Housing: High Risk (04/22/2024)  Transportation Needs: Unmet Transportation Needs (04/15/2024)  Utilities: At Risk (04/15/2024)  Depression (PHQ2-9): Low Risk (06/04/2021)  Social Connections: Unknown (04/15/2024)  Tobacco Use: Medium Risk (05/10/2024)    Readmission Risk Interventions    01/15/2024    2:24 PM  Readmission Risk Prevention Plan  Transportation Screening Complete  HRI or Home Care Consult Complete  Social Work Consult for Recovery Care Planning/Counseling Complete  Palliative Care Screening Not Applicable  Medication Review Oceanographer) Complete

## 2024-05-17 NOTE — Progress Notes (Signed)
 "                        PROGRESS NOTE        PATIENT DETAILS Name: [name] Age: 79 y.o. Sex: female Date of Birth: Sep 26, 1945 Admit Date: 04/13/2024 Admitting Physician Tamela Stakes, MD ERE:Bzmryzrx, Rojelio PARAS, NP  Brief Summary: Patient is a 79 y.o.  female with history of CVA, COPD, HTN-who was brought to the ED after she was found by her roommate unresponsive (no heat/light-under a single blanket)-she was apparently covered in feces/dried blood-found to be hypothermic/hypotensive/hypoglycemic-she was thought to have undifferentiated shock-acute liver failure-acute kidney injury-admitted to the ICU-stabilized with pressors/D10 infusion/broad-spectrum antibiotics-rapidly improved and subsequently transferred to [facility].  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  Unfortunately-in spite of being on IV antibiotics-other supportive measures-an extended hospitalization-patient did not have any significant clinical improvement-oral intake continues to be erratic and mostly poor-palliative care was involved-after extensive discussion with patient's son-was transition to comfort measures on 12/29.  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 12/8>> ANA: Positive 12/8>> anti-smooth muscle antibody: Negative  Significant microbiology data: 12/6>> COVID/influenza/RSV PCR: Negative 12/6>> blood culture: No growth 12/6>> acute hepatitis serology: Negative 12/6>> CMV DNA PCR: Negative 12/9>> blood culture: No growth  Procedures: Line insertion 05/04/2024  Consults: PCCM GI ID Hematology [name]) Palliative care  Subjective:    No major issues overnight-lying comfortably in bed.  Objective: Vitals: Blood pressure (!) 144/69, pulse 70, temperature 98.3 F (36.8 C), temperature source Oral, resp. rate 18, height 5' 10 (1.778 m), weight 79.4 kg, SpO2 97%.   Exam: Sleeping comfortably-not in any distress Easily arouses-pleasantly confused Left-sided hemiparesis.    Assessment/Plan: Goals of care Palliative input greatly appreciated, overall patient noncompliant, does appear with dementia/cognitive issues, does not  have capacity, palliative medicine and APS involved, continues to have very poor oral intake, very poor life quality, significant psychosocial issues, at this point decision has been made to to focus on comfort, and foregoing life-prolonging measures.  Awaiting appropriate disposition-either residential hospice or SNF.  Her oral intake is mostly poor-per nursing staff-she is only eating a small portion of her meals.  Evaluated by hospice-not a candidate for residential hospice-discussed with TOC if we can arrange for SNF with hospice follow-up instead.    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  Due to thrombocytopenia-TEE was not pursued-however it was felt that since patient would  be treated for 6 weeks-TEE would not change management.  PICC line was placed on 12/27.  Given ANA positive status-nonbacterial thrombotic endocarditis is also possible.   Unfortunately-no significant clinical improvement-has been followed by palliative care  services closely-after discussion with patient's son-all antibiotics was discontinued 12/29-with focus mostly on comfort.  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   Treated with 2 doses of IVIG on 12/12 and 12/13 without any significant improvement-subsequently given Nplate  on 12/14 and 12/19-thankfully platelet counts are now obtained during  Normocytic anemia   Likely secondary to critical illness-no evidence of blood loss/GI bleeding Required 2 units of PRBC so far  SVT on 12/15 Required adenosine  Currently stable on beta-blocker TSH stable.    Acute metabolic encephalopathy-likely superimposed on underlying dementia. Hospital delirium Acute metabolic encephalopathy initially felt to be secondary to possible sepsis physiology/liver failure-however continued to have waxing/waning mentation-even after treatment of underlying sepsis physiology/improvement in liver function- at this point is secondary to hospital-acquired delirium-probably superimposed on some amount of undiagnosed dementia.  Evaluated by psychiatry-does not have capacity Started on Remeron  and Seroquel  Has now been transition to full comfort measures.  Hypokalemia Replaced  Hypothermia Likely secondary to environmental exposure (per prior notes- cool room with single blanket-no heating or light) TSH stable Now normothermic after supportive care  Acute liver failure Unclear etiology-possibly shock liver from hypotension from possible sepsis  LFTs/coags improving Dopplers without portal/hepatic vein thrombosis Acute hepatitis serology negative CMV IgM positive but viral load negative  Negative  workup for autoimmune hepatitis completed N-acetylcysteine  12/10 Empirically on lactulose  Gastroenterology input appreciated  Oropharyngeal dysphagia Secondary to critical illness/encephalopathy SLP following-on dysphagia 2 diet.  AKI Hemodynamically mediated due to hypotension Resolved.  Hypokalemia/Hypophosphatemia Repleted  Metabolic acidosis Likely due to delayed clearance of lactic acid-in the setting of liver failure. Improved with supportive care  Self-limited episode-small-volume coffee-ground emesis Hb stable PPI By GI, resolved, no procedures.  Chest pain Likely atypical Resolved  Minimally elevated troponins Trend is flat Likely demand ischemia in the setting of hypotension/critical illness. Echo with stable EF  HTN BP stable, low-dose beta-blocker and Norvasc .  History of chronic HFpEF Status stable  History of CVA Baseline left hemiparesis Antiplatelets/statin held-in the setting of elevated liver enzymes and thrombocytopenia.  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  It is not felt that she has severe failure to thrive syndrome-extremely poor oral intake that is the primary cause of her hypoglycemia.  Unfortunately-in spite of adding Remeron -she has not significantly improved.  See above discussion regarding goals of care-now full comfort measures.  Pressure Ulcer: Agree with assessment and plan as outlined below Wound 04/14/24 0100 Pressure Injury Hip Left Stage 2 -  Partial thickness  loss of dermis presenting as a shallow open injury with a red, pink wound bed without slough. (Active)     Wound 04/14/24 0100 Pressure Injury Buttocks Left;Right Unstageable - Full thickness tissue loss in which the base of the injury is covered by slough (yellow, tan, gray, green or brown) and/or eschar (tan, brown or black) in the wound bed. (Active)     Wound 04/14/24 0100 Pressure Injury Sacrum Medial Stage 2 -  Partial thickness loss of dermis presenting as a shallow open injury with a red, pink wound bed without slough. (Active)    Code status:   Code  Status: Limited: Do not attempt resuscitation (DNR) -DNR-LIMITED -Do Not Intubate/DNI    DVT Prophylaxis: SCDs Start: 04/13/24 2344    Family Communication: None at bedside.  Disposition Plan: Status is: Inpatient     Diet: Diet Order             DIET DYS 3 Room service appropriate? Yes; Fluid consistency: Thin  Diet effective now                     Data Review:   Patient Lines/Drains/Airways Status     Active Line/Drains/Airways     Name Placement date Placement time Site Days   PICC Single Lumen 05/04/24 Right Basilic 36 cm 0 cm 05/04/24  8676  Basilic  13   External Urinary Catheter 04/29/24  0553  --  18   Wound 04/14/24 0100 Pressure Injury Hip Left Stage 2 -  Partial thickness loss of dermis presenting as a shallow open injury with a red, pink wound bed without slough. 04/14/24  0100  Hip  33   Wound 04/14/24 0100 Pressure Injury Buttocks Left;Right Unstageable - Full thickness tissue loss in which the base of the injury is covered by slough (yellow, tan, gray, green or brown) and/or eschar (tan, brown or black) in the wound bed. 04/14/24  0100  Buttocks  33   Wound 04/14/24 0100 Pressure Injury Sacrum Medial Stage 2 -  Partial thickness loss of dermis presenting as a shallow open injury with a red, pink wound bed without slough. 04/14/24  0100  Sacrum  33             Inpatient  Medications  Scheduled Meds:  Chlorhexidine  Gluconate Cloth  6 each Topical Daily   feeding supplement  237 mL Oral TID BM   Gerhardt's butt cream   Topical BID   lactulose   20 g Oral BID   mirtazapine   30 mg Oral QHS   mouth rinse  15 mL Mouth Rinse 4 times per day   pantoprazole   40 mg Oral BID   QUEtiapine   25 mg Oral BID   sodium chloride  flush  10-40 mL Intracatheter Q12H   Continuous Infusions:   PRN Meds:.bisacodyl , diphenhydrAMINE , docusate, HYDROcodone -acetaminophen , ipratropium-albuterol , LORazepam , melatonin, ondansetron  (ZOFRAN ) IV, mouth rinse, polyethylene glycol, QUEtiapine , sodium chloride  flush  DVT Prophylaxis  SCDs Start: 04/13/24 2344   Recent Labs  Lab 05/12/24 0328  WBC 4.2  HGB 8.6*  HCT 26.8*  PLT 129*  MCV 98.2  MCH 31.5  MCHC 32.1  RDW 19.0*    Recent Labs  Lab 05/12/24 0328  NA 142  K 3.4*  CL 110  CO2 26  ANIONGAP 6  GLUCOSE 93  BUN 18  CREATININE 0.41*  CALCIUM  8.2*       Recent Labs  Lab 05/12/24 0328  CALCIUM  8.2*     --------------------------------------------------------------------------------------------------------------- Lab Results  Component Value Date   CHOL 288 (H) 05/30/2023   HDL 78 05/30/2023   LDLCALC 165 (H) 05/30/2023   TRIG 248 (H) 05/30/2023   CHOLHDL 3.7 05/30/2023    Lab Results  Component Value Date   HGBA1C 5.3 09/11/2020   No results for input(s): TSH, T4TOTAL, FREET4, T3FREE, THYROIDAB in the last 72 hours.  No results for input(s): VITAMINB12, FOLATE, FERRITIN, TIBC, IRON, RETICCTPCT in the last 72 hours. ------------------------------------------------------------------------------------------------------------------ Cardiac Enzymes No results for input(s): CKMB, TROPONINI, MYOGLOBIN in the last 168 hours.  Invalid input(s): CK  Micro Results No results found for this or any  previous visit (from the past 240 hours).   Radiology Reports  No  results found.    Signature  -   Donalda Applebaum M.D on 05/17/2024 at 1:52 PM   -  To page go to [email or web address]  "

## 2024-05-18 DIAGNOSIS — K7200 Acute and subacute hepatic failure without coma: Secondary | ICD-10-CM | POA: Diagnosis not present

## 2024-05-18 NOTE — Plan of Care (Signed)

## 2024-05-18 NOTE — Progress Notes (Signed)
 "                        PROGRESS NOTE        PATIENT DETAILS Name: [name] Age: 79 y.o. Sex: female Date of Birth: January 29, 1946 Admit Date: 04/13/2024 Admitting Physician Tamela Stakes, MD ERE:Bzmryzrx, Rojelio PARAS, NP  Brief Summary: Patient is a 79 y.o.  female with history of CVA, COPD, HTN-who was brought to the ED after she was found by her roommate unresponsive (no heat/light-under a single blanket)-she was apparently covered in feces/dried blood-found to be hypothermic/hypotensive/hypoglycemic-she was thought to have undifferentiated shock-acute liver failure-acute kidney injury-admitted to the ICU-stabilized with pressors/D10 infusion/broad-spectrum antibiotics-rapidly improved and subsequently transferred to [facility].  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  Unfortunately-in spite of being on IV antibiotics-other supportive measures-an extended hospitalization-patient did not have any significant clinical improvement-oral intake continues to be erratic and mostly poor-palliative care was involved-after extensive discussion with patient's son-was transition to comfort measures on 12/29.  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 12/8>> ANA: Positive 12/8>> anti-smooth muscle antibody: Negative  Significant microbiology data: 12/6>> COVID/influenza/RSV PCR: Negative 12/6>> blood culture: No growth 12/6>> acute hepatitis serology: Negative 12/6>> CMV DNA PCR: Negative 12/9>> blood culture: No growth  Procedures: Line insertion 05/04/2024  Consults: PCCM GI ID Hematology [name]) Palliative care  Subjective:    No major issues overnight unchanged-awaiting SNF bed.  Objective: Vitals: Blood pressure (!) 141/60, pulse (!) 58, temperature 98 F (36.7 C), temperature source Oral, resp. rate 17, height 5' 10 (1.778 m), weight 79.4 kg, SpO2 (!) 89%.   Exam: Sleeping comfortably-not in any distress Easily arouses-pleasantly confused Left-sided hemiparesis.    Assessment/Plan: Goals of care Palliative input greatly appreciated, overall patient noncompliant, does appear with dementia/cognitive issues, does not  have capacity, palliative medicine and APS involved, continues to have very poor oral intake, very poor life quality, significant psychosocial issues, at this point decision has been made to to focus on comfort, and foregoing life-prolonging measures.  Awaiting appropriate disposition-either residential hospice or SNF.  Her oral intake is mostly poor-per nursing staff-she is only eating a small portion of her meals.  Evaluated by hospice-not a candidate for residential hospice-discussed with TOC if we can arrange for SNF with hospice follow-up instead.    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  Due to thrombocytopenia-TEE was not pursued-however it was felt that since patient  would be treated for 6 weeks-TEE would not change management.  PICC line was placed on 12/27.  Given ANA positive status-nonbacterial thrombotic endocarditis is also possible.   Unfortunately-no significant clinical improvement-has been followed by  palliative care services closely-after discussion with patient's son-all antibiotics was discontinued 12/29-with focus mostly on comfort.  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   Treated with 2 doses of IVIG on 12/12 and 12/13 without any significant improvement-subsequently given Nplate  on 12/14 and 12/19-thankfully platelet counts are now obtained during  Normocytic anemia   Likely secondary to critical illness-no evidence of blood loss/GI bleeding Required 2 units of PRBC so far  SVT on 12/15 Required adenosine  Currently stable on beta-blocker TSH stable.    Acute metabolic encephalopathy-likely superimposed on underlying dementia. Hospital delirium Acute metabolic encephalopathy initially felt to be secondary to possible sepsis physiology/liver failure-however continued to have waxing/waning mentation-even after treatment of underlying sepsis physiology/improvement in liver function- at this point is secondary to hospital-acquired delirium-probably superimposed on some amount of undiagnosed dementia.  Evaluated by psychiatry-does not have capacity Started on Remeron  and Seroquel  Has now been transition to full comfort measures.  Hypokalemia Replaced  Hypothermia Likely secondary to environmental exposure (per prior notes- cool room with single blanket-no heating or light) TSH stable Now normothermic after supportive care  Acute liver failure Unclear etiology-possibly shock liver from hypotension from possible sepsis  LFTs/coags improving Dopplers without portal/hepatic vein thrombosis Acute hepatitis serology negative CMV IgM positive but viral load negative   Negative workup for autoimmune hepatitis completed N-acetylcysteine  12/10 Empirically on lactulose  Gastroenterology input appreciated  Oropharyngeal dysphagia Secondary to critical illness/encephalopathy SLP following-on dysphagia 2 diet.  AKI Hemodynamically mediated due to hypotension Resolved.  Hypokalemia/Hypophosphatemia Repleted  Metabolic acidosis Likely due to delayed clearance of lactic acid-in the setting of liver failure. Improved with supportive care  Self-limited episode-small-volume coffee-ground emesis Hb stable PPI By GI, resolved, no procedures.  Chest pain Likely atypical Resolved  Minimally elevated troponins Trend is flat Likely demand ischemia in the setting of hypotension/critical illness. Echo with stable EF  HTN BP stable, low-dose beta-blocker and Norvasc .  History of chronic HFpEF Status stable  History of CVA Baseline left hemiparesis Antiplatelets/statin held-in the setting of elevated liver enzymes and thrombocytopenia.  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  It is not felt that she has severe failure to thrive syndrome-extremely poor oral intake that is the primary cause of her hypoglycemia.  Unfortunately-in spite of adding Remeron -she has not significantly improved.  See above discussion regarding goals of care-now full comfort measures.  Pressure Ulcer: Agree with assessment and plan as outlined below Wound 04/14/24 0100 Pressure Injury Hip Left Stage 2 -  Partial  thickness loss of dermis presenting as a shallow open injury with a red, pink wound bed without slough. (Active)     Wound 04/14/24 0100 Pressure Injury Buttocks Left;Right Unstageable - Full thickness tissue loss in which the base of the injury is covered by slough (yellow, tan, gray, green or brown) and/or eschar (tan, brown or black) in the wound bed. (Active)     Wound 04/14/24 0100 Pressure Injury Sacrum Medial Stage 2 -  Partial thickness loss of dermis presenting as a shallow open injury with a red, pink wound bed without slough. (Active)    Code status:  Code Status: Limited: Do not attempt resuscitation (DNR) -DNR-LIMITED -Do Not Intubate/DNI    DVT Prophylaxis: SCDs Start: 04/13/24 2344    Family Communication: None at bedside.  Disposition Plan: Status is: Inpatient     Diet: Diet Order             DIET DYS 3 Room service appropriate? Yes; Fluid consistency: Thin  Diet effective now                     Data Review:   Patient Lines/Drains/Airways Status     Active Line/Drains/Airways     Name Placement date Placement time Site Days   PICC Single Lumen 05/04/24 Right Basilic 36 cm 0 cm 05/04/24  8676  Basilic  14   External Urinary Catheter 04/29/24  0553  --  19   Wound 04/14/24 0100 Pressure Injury Hip Left Stage 2 -  Partial thickness loss of dermis presenting as a shallow open injury with a red, pink wound bed without slough. 04/14/24  0100  Hip  34   Wound 04/14/24 0100 Pressure Injury Buttocks Left;Right Unstageable - Full thickness tissue loss in which the base of the injury is covered by slough (yellow, tan, gray, green or brown) and/or eschar (tan, brown or black) in the wound bed. 04/14/24  0100  Buttocks  34   Wound 04/14/24 0100 Pressure Injury Sacrum Medial Stage 2 -  Partial thickness loss of dermis presenting as a shallow open injury with a red, pink wound bed without slough. 04/14/24  0100  Sacrum  34             Inpatient  Medications  Scheduled Meds:  Chlorhexidine  Gluconate Cloth  6 each Topical Daily   feeding supplement  237 mL Oral TID BM   Gerhardt's butt cream   Topical BID   lactulose   20 g Oral BID   mirtazapine   30 mg Oral QHS   mouth rinse  15 mL Mouth Rinse 4 times per day   pantoprazole   40 mg Oral BID   QUEtiapine   25 mg Oral BID   sodium chloride  flush  10-40 mL Intracatheter Q12H   Continuous Infusions:   PRN Meds:.bisacodyl , diphenhydrAMINE , docusate, HYDROcodone -acetaminophen , ipratropium-albuterol , LORazepam , melatonin, ondansetron  (ZOFRAN ) IV, mouth rinse, polyethylene glycol, QUEtiapine , sodium chloride  flush  DVT Prophylaxis  SCDs Start: 04/13/24 2344   Recent Labs  Lab 05/12/24 0328  WBC 4.2  HGB 8.6*  HCT 26.8*  PLT 129*  MCV 98.2  MCH 31.5  MCHC 32.1  RDW 19.0*    Recent Labs  Lab 05/12/24 0328  NA 142  K 3.4*  CL 110  CO2 26  ANIONGAP 6  GLUCOSE 93  BUN 18  CREATININE 0.41*  CALCIUM  8.2*       Recent Labs  Lab 05/12/24 0328  CALCIUM  8.2*     --------------------------------------------------------------------------------------------------------------- Lab Results  Component Value Date   CHOL 288 (H) 05/30/2023   HDL 78 05/30/2023   LDLCALC 165 (H) 05/30/2023   TRIG 248 (H) 05/30/2023   CHOLHDL 3.7 05/30/2023    Lab Results  Component Value Date   HGBA1C 5.3 09/11/2020   No results for input(s): TSH, T4TOTAL, FREET4, T3FREE, THYROIDAB in the last 72 hours.  No results for input(s): VITAMINB12, FOLATE, FERRITIN, TIBC, IRON, RETICCTPCT in the last 72 hours. ------------------------------------------------------------------------------------------------------------------ Cardiac Enzymes No results for input(s): CKMB, TROPONINI, MYOGLOBIN in the last 168 hours.  Invalid input(s): CK  Micro Results No results found for this or  any previous visit (from the past 240 hours).   Radiology Reports  No  results found.    Signature  -   Donalda Applebaum M.D on 05/18/2024 at 10:43 AM   -  To page go to [email or web address]  "

## 2024-05-18 NOTE — Plan of Care (Signed)

## 2024-05-19 DIAGNOSIS — K7200 Acute and subacute hepatic failure without coma: Secondary | ICD-10-CM | POA: Diagnosis not present

## 2024-05-19 LAB — BASIC METABOLIC PANEL WITH GFR
Anion gap: 7 (ref 5–15)
BUN: 17 mg/dL (ref 8–23)
CO2: 28 mmol/L (ref 22–32)
Calcium: 8.3 mg/dL — ABNORMAL LOW (ref 8.9–10.3)
Chloride: 105 mmol/L (ref 98–111)
Creatinine, Ser: 0.47 mg/dL (ref 0.44–1.00)
GFR, Estimated: >60 mL/min
Glucose, Bld: 78 mg/dL (ref 70–99)
Potassium: 3.6 mmol/L (ref 3.5–5.1)
Sodium: 140 mmol/L (ref 135–145)

## 2024-05-19 LAB — CBC
HCT: 28.1 % — ABNORMAL LOW (ref 36.0–46.0)
Hemoglobin: 9.1 g/dL — ABNORMAL LOW (ref 12.0–15.0)
MCH: 32.2 pg (ref 26.0–34.0)
MCHC: 32.4 g/dL (ref 30.0–36.0)
MCV: 99.3 fL (ref 80.0–100.0)
Platelets: 94 K/uL — ABNORMAL LOW (ref 150–400)
RBC: 2.83 MIL/uL — ABNORMAL LOW (ref 3.87–5.11)
RDW: 18.2 % — ABNORMAL HIGH (ref 11.5–15.5)
WBC: 4 K/uL (ref 4.0–10.5)
nRBC: 0 % (ref 0.0–0.2)

## 2024-05-19 NOTE — Plan of Care (Signed)

## 2024-05-19 NOTE — Progress Notes (Signed)
 "                        PROGRESS NOTE        PATIENT DETAILS Name: [name] Age: 79 y.o. Sex: female Date of Birth: [birth year] Admit Date: 04/13/2024 Admitting Physician Tamela Stakes, MD ERE:Bzmryzrx, Rojelio PARAS, NP  Brief Summary: Patient is a 79 y.o.  female with history of CVA, COPD, HTN-who was brought to the ED after she was found by her roommate unresponsive (no heat/light-under a single blanket)-she was apparently covered in feces/dried blood-found to be hypothermic/hypotensive/hypoglycemic-she was thought to have undifferentiated shock-acute liver failure-acute kidney injury-admitted to the ICU-stabilized with pressors/D10 infusion/broad-spectrum antibiotics-rapidly improved and subsequently transferred to [facility].  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  Unfortunately-in spite of being on IV antibiotics-other supportive measures-an extended hospitalization-patient did not have any significant clinical improvement-oral intake continues to be erratic and mostly poor-palliative care was involved-after extensive discussion with patient's son-was transition to comfort measures on 12/29.  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 12/8>> ANA: Positive 12/8>> anti-smooth muscle antibody: Negative  Significant microbiology data: 12/6>> COVID/influenza/RSV PCR: Negative 12/6>> blood culture: No growth 12/6>> acute hepatitis serology: Negative 12/6>> CMV DNA PCR: Negative 12/9>> blood culture: No growth  Procedures: Line insertion 05/04/2024  Consults: PCCM GI ID Hematology [name]) Palliative care  Subjective:    Patient in bed in no distress, no headache or chest pain.  Objective: Vitals: Blood pressure 131/77, pulse 66, temperature 97.9 F (36.6 C), temperature source Oral, resp. rate 17, height 5' 10 (1.778 m), weight 79.4 kg, SpO2 (!) 89%.   Exam: Sleeping comfortably-not in any distress Easily arouses-pleasantly confused Left-sided hemiparesis.    Assessment/Plan: Goals of care Palliative input greatly appreciated, overall patient noncompliant, does appear with dementia/cognitive issues, does not  have capacity, palliative medicine and APS involved, continues to have very poor oral intake, very poor life quality, significant psychosocial issues, at this point decision has been made to to focus on comfort, and foregoing life-prolonging measures.  Awaiting appropriate disposition-either residential hospice or SNF.  Her oral intake is mostly poor-per nursing staff-she is only eating a small portion of her meals.  Evaluated by hospice-not a candidate for residential hospice-discussed with TOC if we can arrange for SNF with hospice follow-up instead.    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  Due to thrombocytopenia-TEE was not pursued-however it was felt that since patient  would be treated for 6 weeks-TEE would not change management.  PICC line was placed on 12/27.  Given ANA positive status-nonbacterial thrombotic endocarditis is also possible.   Unfortunately-no significant clinical improvement-has been  followed by palliative care services closely-after discussion with patient's son-all antibiotics was discontinued 12/29-with focus mostly on comfort.  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   Treated with 2 doses of IVIG on 12/12 and 12/13 without any significant improvement-subsequently given Nplate  on 12/14 and 12/19-thankfully platelet counts are now obtained during  Normocytic anemia   Likely secondary to critical illness-no evidence of blood loss/GI bleeding Required 2 units of PRBC so far  SVT on 12/15 Required adenosine  Currently stable on beta-blocker TSH stable.    Acute metabolic encephalopathy-likely superimposed on underlying dementia. Hospital delirium Acute metabolic encephalopathy initially felt to be secondary to possible sepsis physiology/liver failure-however continued to have waxing/waning mentation-even after treatment of underlying sepsis physiology/improvement in liver function- at this point is secondary to hospital-acquired delirium-probably superimposed on some amount of undiagnosed dementia.  Evaluated by psychiatry-does not have capacity Started on Remeron  and Seroquel  Has now been transition to full comfort measures.  Hypokalemia Replaced  Hypothermia Likely secondary to environmental exposure (per prior notes- cool room with single blanket-no heating or light) TSH stable Now normothermic after supportive care  Acute liver failure Unclear etiology-possibly shock liver from hypotension from possible sepsis  LFTs/coags improving Dopplers without portal/hepatic vein thrombosis Acute hepatitis serology negative CMV IgM positive but viral load negative   Negative workup for autoimmune hepatitis completed N-acetylcysteine  12/10 Empirically on lactulose  Gastroenterology input appreciated  Oropharyngeal dysphagia Secondary to critical illness/encephalopathy SLP following-on dysphagia 2 diet.  AKI Hemodynamically mediated due to hypotension Resolved.  Hypokalemia/Hypophosphatemia Repleted  Metabolic acidosis Likely due to delayed clearance of lactic acid-in the setting of liver failure. Improved with supportive care  Self-limited episode-small-volume coffee-ground emesis Hb stable PPI By GI, resolved, no procedures.  Chest pain Likely atypical Resolved  Minimally elevated troponins Trend is flat Likely demand ischemia in the setting of hypotension/critical illness. Echo with stable EF  HTN BP stable, low-dose beta-blocker and Norvasc .  History of chronic HFpEF Status stable  History of CVA Baseline left hemiparesis Antiplatelets/statin held-in the setting of elevated liver enzymes and thrombocytopenia.  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  It is not felt that she has severe failure to thrive syndrome-extremely poor oral intake that is the primary cause of her hypoglycemia.  Unfortunately-in spite of adding Remeron -she has not significantly improved.  See above discussion regarding goals of care-now full comfort measures.  Pressure Ulcer: Agree with assessment and plan as outlined below Wound 04/14/24 0100 Pressure Injury Hip Left Stage 2 -  Partial  thickness loss of dermis presenting as a shallow open injury with a red, pink wound bed without slough. (Active)     Wound 04/14/24 0100 Pressure Injury Buttocks Left;Right Unstageable - Full thickness tissue loss in which the base of the injury is covered by slough (yellow, tan, gray, green or brown) and/or eschar (tan, brown or black) in the wound bed. (Active)     Wound 04/14/24 0100 Pressure Injury Sacrum Medial Stage 2 -  Partial thickness loss of dermis presenting as a shallow open injury with a red, pink wound bed without slough. (Active)    Code  status:   Code Status: Limited: Do not attempt resuscitation (DNR) -DNR-LIMITED -Do Not Intubate/DNI    DVT Prophylaxis: SCDs Start: 04/13/24 2344    Family Communication: None at bedside.  Disposition Plan: Status is: Inpatient     Diet: Diet Order             DIET DYS 3 Room service appropriate? Yes; Fluid consistency: Thin  Diet effective now                     Data Review:   Patient Lines/Drains/Airways Status     Active Line/Drains/Airways     Name Placement date Placement time Site Days   PICC Single Lumen 05/04/24 Right Basilic 36 cm 0 cm 05/04/24  8676  Basilic  15   External Urinary Catheter 04/29/24  0553  --  20   Wound 04/14/24 0100 Pressure Injury Hip Left Stage 2 -  Partial thickness loss of dermis presenting as a shallow open injury with a red, pink wound bed without slough. 04/14/24  0100  Hip  35   Wound 04/14/24 0100 Pressure Injury Buttocks Left;Right Unstageable - Full thickness tissue loss in which the base of the injury is covered by slough (yellow, tan, gray, green or brown) and/or eschar (tan, brown or black) in the wound bed. 04/14/24  0100  Buttocks  35   Wound 04/14/24 0100 Pressure Injury Sacrum Medial Stage 2 -  Partial thickness loss of dermis presenting as a shallow open injury with a red, pink wound bed without slough. 04/14/24  0100  Sacrum  35             Inpatient  Medications  Scheduled Meds:  Chlorhexidine  Gluconate Cloth  6 each Topical Daily   feeding supplement  237 mL Oral TID BM   Gerhardt's butt cream   Topical BID   lactulose   20 g Oral BID   mirtazapine   30 mg Oral QHS   mouth rinse  15 mL Mouth Rinse 4 times per day   pantoprazole   40 mg Oral BID   QUEtiapine   25 mg Oral BID   sodium chloride  flush  10-40 mL Intracatheter Q12H   Continuous Infusions:   PRN Meds:.bisacodyl , diphenhydrAMINE , docusate, HYDROcodone -acetaminophen , ipratropium-albuterol , LORazepam , melatonin, ondansetron  (ZOFRAN ) IV, mouth rinse, polyethylene glycol, QUEtiapine , sodium chloride  flush  DVT Prophylaxis  SCDs Start: 04/13/24 2344   Recent Labs  Lab 05/19/24 0331  WBC 4.0  HGB 9.1*  HCT 28.1*  PLT 94*  MCV 99.3  MCH 32.2  MCHC 32.4  RDW 18.2*    Recent Labs  Lab 05/19/24 0331  NA 140  K 3.6  CL 105  CO2 28  ANIONGAP 7  GLUCOSE 78  BUN 17  CREATININE 0.47  CALCIUM  8.3*       Recent Labs  Lab 05/19/24 0331  CALCIUM  8.3*     --------------------------------------------------------------------------------------------------------------- Lab Results  Component Value Date   CHOL 288 (H) 05/30/2023   HDL 78 05/30/2023   LDLCALC 165 (H) 05/30/2023   TRIG 248 (H) 05/30/2023   CHOLHDL 3.7 05/30/2023    Lab Results  Component Value Date   HGBA1C 5.3 09/11/2020   No results for input(s): TSH, T4TOTAL, FREET4, T3FREE, THYROIDAB in the last 72 hours.  No results for input(s): VITAMINB12, FOLATE, FERRITIN, TIBC, IRON, RETICCTPCT in the last 72 hours. ------------------------------------------------------------------------------------------------------------------ Cardiac Enzymes No results for input(s): CKMB, TROPONINI, MYOGLOBIN in the last 168 hours.  Invalid input(s): CK  Micro Results No results found  for this or any previous visit (from the past 240 hours).   Radiology Reports  No  results found.    Signature  -   Lavada Stank M.D on 05/19/2024 at 8:34 AM   -  To page go to [email or web address]  "

## 2024-05-20 DIAGNOSIS — K7200 Acute and subacute hepatic failure without coma: Secondary | ICD-10-CM | POA: Diagnosis not present

## 2024-05-20 NOTE — Plan of Care (Signed)
" °  Problem: Education: Goal: Knowledge of General Education information will improve Description: Including pain rating scale, medication(s)/side effects and non-pharmacologic comfort measures Outcome: Not Progressing   Problem: Clinical Measurements: Goal: Ability to maintain clinical measurements within normal limits will improve Outcome: Not Progressing   Problem: Clinical Measurements: Goal: Diagnostic test results will improve Outcome: Not Progressing   Problem: Clinical Measurements: Goal: Respiratory complications will improve Outcome: Not Progressing   "

## 2024-05-20 NOTE — Progress Notes (Signed)
 "                        PROGRESS NOTE        PATIENT DETAILS Name: [name] Age: 79 y.o. Sex: female Date of Birth: 1945/08/09 Admit Date: 04/13/2024 Admitting Physician Tamela Stakes, MD ERE:Bzmryzrx, Rojelio PARAS, NP  Brief Summary: Patient is a 79 y.o.  female with history of CVA, COPD, HTN-who was brought to the ED after she was found by her roommate unresponsive (no heat/light-under a single blanket)-she was apparently covered in feces/dried blood-found to be hypothermic/hypotensive/hypoglycemic-she was thought to have undifferentiated shock-acute liver failure-acute kidney injury-admitted to the ICU-stabilized with pressors/D10 infusion/broad-spectrum antibiotics-rapidly improved and subsequently transferred to [facility].  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  Unfortunately-in spite of being on IV antibiotics-other supportive measures-an extended hospitalization-patient did not have any significant clinical improvement-oral intake continues to be erratic and mostly poor-palliative care was involved-after extensive discussion with patient's son-was transition to comfort measures on 12/29.  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 12/8>> ANA: Positive 12/8>> anti-smooth muscle antibody: Negative  Significant microbiology data: 12/6>> COVID/influenza/RSV PCR: Negative 12/6>> blood culture: No growth 12/6>> acute hepatitis serology: Negative 12/6>> CMV DNA PCR: Negative 12/9>> blood culture: No growth  Procedures: Line insertion 05/04/2024  Consults: PCCM GI ID Hematology [name]) Palliative care  Subjective: Patient in bed, comfortable, slightly confused but denies any headache or chest pain.  Objective: Vitals: Blood pressure 131/77, pulse (!) 56, temperature 98.2 F (36.8 C), temperature source Oral, resp. rate 18, height 5' 10 (1.778 m), weight 79.4 kg, SpO2 (!) 89%.   Exam: Sleeping comfortably-not in any distress Easily arouses-pleasantly confused Left-sided hemiparesis.    Assessment/Plan: Goals of care Palliative input greatly appreciated, overall patient noncompliant, does appear with dementia/cognitive issues, does not  have capacity, palliative medicine and APS involved, continues to have very poor oral intake, very poor life quality, significant psychosocial issues, at this point decision has been made to to focus on comfort, and foregoing life-prolonging measures.  Awaiting appropriate disposition-either residential hospice or SNF.  Her oral intake is mostly poor-per nursing staff-she is only eating a small portion of her meals.  Evaluated by hospice-not a candidate for residential hospice-discussed with TOC if we can arrange for SNF with hospice follow-up instead.    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  Due to thrombocytopenia-TEE was not pursued-however it  was felt that since patient would be treated for 6 weeks-TEE would not change management.  PICC line was placed on 12/27.  Given ANA positive status-nonbacterial thrombotic endocarditis is also possible.   Unfortunately-no significant clinical improvement-has been  followed by palliative care services closely-after discussion with patient's son-all antibiotics was discontinued 12/29-with focus mostly on comfort.  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   Treated with 2 doses of IVIG on 12/12 and 12/13 without any significant improvement-subsequently given Nplate  on 12/14 and 12/19-thankfully platelet counts are now obtained during  Normocytic anemia   Likely secondary to critical illness-no evidence of blood loss/GI bleeding Required 2 units of PRBC so far  SVT on 12/15 Required adenosine  Currently stable on beta-blocker TSH stable.    Acute metabolic encephalopathy-likely superimposed on underlying dementia. Hospital delirium Acute metabolic encephalopathy initially felt to be secondary to possible sepsis physiology/liver failure-however continued to have waxing/waning mentation-even after treatment of underlying sepsis physiology/improvement in liver function- at this point is secondary to hospital-acquired delirium-probably superimposed on some amount of undiagnosed dementia.  Evaluated by psychiatry-does not have capacity Started on Remeron  and Seroquel  Has now been transition to full comfort measures.  Hypokalemia Replaced  Hypothermia Likely secondary to environmental exposure (per prior notes- cool room with single blanket-no heating or light) TSH stable Now normothermic after supportive care  Acute liver failure Unclear etiology-possibly shock liver from hypotension from possible sepsis  LFTs/coags improving Dopplers without portal/hepatic vein thrombosis Acute hepatitis serology negative CMV IgM positive  but viral load negative  Negative workup for autoimmune hepatitis completed N-acetylcysteine  12/10 Empirically on lactulose  Gastroenterology input appreciated  Oropharyngeal dysphagia Secondary to critical illness/encephalopathy SLP following-on dysphagia 2 diet.  AKI Hemodynamically mediated due to hypotension Resolved.  Hypokalemia/Hypophosphatemia Repleted  Metabolic acidosis Likely due to delayed clearance of lactic acid-in the setting of liver failure. Improved with supportive care  Self-limited episode-small-volume coffee-ground emesis Hb stable PPI By GI, resolved, no procedures.  Chest pain Likely atypical Resolved  Minimally elevated troponins Trend is flat Likely demand ischemia in the setting of hypotension/critical illness. Echo with stable EF  HTN BP stable, low-dose beta-blocker and Norvasc .  History of chronic HFpEF Status stable  History of CVA Baseline left hemiparesis Antiplatelets/statin held-in the setting of elevated liver enzymes and thrombocytopenia.  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  It is not felt that she has severe failure to thrive syndrome-extremely poor oral intake that is the primary cause of her hypoglycemia.  Unfortunately-in spite of adding Remeron -she has not significantly improved.  See above discussion regarding goals of care-now full comfort measures.  Pressure Ulcer: Agree with assessment and plan as outlined below Wound 04/14/24 0100 Pressure Injury Hip  Left Stage 2 -  Partial thickness loss of dermis presenting as a shallow open injury with a red, pink wound bed without slough. (Active)     Wound 04/14/24 0100 Pressure Injury Buttocks Left;Right Unstageable - Full thickness tissue loss in which the base of the injury is covered by slough (yellow, tan, gray, green or brown) and/or eschar (tan, brown or black) in the wound bed. (Active)     Wound 04/14/24 0100 Pressure Injury Sacrum Medial Stage 2 -  Partial thickness loss of dermis presenting as a shallow open injury with a red, pink wound bed without slough. (Active)    Code  status:   Code Status: Limited: Do not attempt resuscitation (DNR) -DNR-LIMITED -Do Not Intubate/DNI    DVT Prophylaxis: SCDs Start: 04/13/24 2344    Family Communication: None at bedside.  Disposition Plan: Status is: Inpatient     Diet: Diet Order             DIET DYS 3 Room service appropriate? Yes; Fluid consistency: Thin  Diet effective now                     Data Review:   Patient Lines/Drains/Airways Status     Active Line/Drains/Airways     Name Placement date Placement time Site Days   PICC Single Lumen 05/04/24 Right Basilic 36 cm 0 cm 05/04/24  8676  Basilic  16   External Urinary Catheter 04/29/24  0553  --  21   Wound 04/14/24 0100 Pressure Injury Hip Left Stage 2 -  Partial thickness loss of dermis presenting as a shallow open injury with a red, pink wound bed without slough. 04/14/24  0100  Hip  36   Wound 04/14/24 0100 Pressure Injury Buttocks Left;Right Unstageable - Full thickness tissue loss in which the base of the injury is covered by slough (yellow, tan, gray, green or brown) and/or eschar (tan, brown or black) in the wound bed. 04/14/24  0100  Buttocks  36   Wound 04/14/24 0100 Pressure Injury Sacrum Medial Stage 2 -  Partial thickness loss of dermis presenting as a shallow open injury with a red, pink wound bed without slough. 04/14/24  0100  Sacrum  36              Inpatient Medications  Scheduled Meds:  Chlorhexidine  Gluconate Cloth  6 each Topical Daily   feeding supplement  237 mL Oral TID BM   Gerhardt's butt cream   Topical BID   lactulose   20 g Oral BID   mirtazapine   30 mg Oral QHS   mouth rinse  15 mL Mouth Rinse 4 times per day   pantoprazole   40 mg Oral BID   QUEtiapine   25 mg Oral BID   sodium chloride  flush  10-40 mL Intracatheter Q12H   Continuous Infusions:   PRN Meds:.bisacodyl , diphenhydrAMINE , docusate, HYDROcodone -acetaminophen , ipratropium-albuterol , LORazepam , melatonin, ondansetron  (ZOFRAN ) IV, mouth rinse, polyethylene glycol, QUEtiapine , sodium chloride  flush  DVT Prophylaxis  SCDs Start: 04/13/24 2344   Recent Labs  Lab 05/19/24 0331  WBC 4.0  HGB 9.1*  HCT 28.1*  PLT 94*  MCV 99.3  MCH 32.2  MCHC 32.4  RDW 18.2*    Recent Labs  Lab 05/19/24 0331  NA 140  K 3.6  CL 105  CO2 28  ANIONGAP 7  GLUCOSE 78  BUN 17  CREATININE 0.47  CALCIUM  8.3*       Recent Labs  Lab 05/19/24 0331  CALCIUM  8.3*     --------------------------------------------------------------------------------------------------------------- Lab Results  Component Value Date   CHOL 288 (H) 05/30/2023   HDL 78 05/30/2023   LDLCALC 165 (H) 05/30/2023   TRIG 248 (H) 05/30/2023   CHOLHDL 3.7 05/30/2023    Lab Results  Component Value Date   HGBA1C 5.3 09/11/2020   No results for input(s): TSH, T4TOTAL, FREET4, T3FREE, THYROIDAB in the last 72 hours.  No results for input(s): VITAMINB12, FOLATE, FERRITIN, TIBC, IRON, RETICCTPCT in the last 72 hours. ------------------------------------------------------------------------------------------------------------------ Cardiac Enzymes No results for input(s): CKMB, TROPONINI, MYOGLOBIN in the last 168 hours.  Invalid input(s): CK  Micro Results No results found  for this or any previous visit (from the past 240 hours).   Radiology  Reports  No results found.    Signature  -   Lavada Stank M.D on 05/20/2024 at 11:49 AM   -  To page go to [email or web address]  "

## 2024-05-20 NOTE — Progress Notes (Signed)
 Physical Therapy Treatment & Discharge Patient Details Name: [name] MRN: [id number] DOB: November 10, 1945 Today's Date: 05/20/2024   History of Present Illness Pt is a 79 y.o. F who presented to [facility] ED 04/13/24 via EMS after she was found by her roommate unresponsive (no heat/light-under a single blanket) covered feces/dried blood. Found to be hypothermic, hypotensive, hypoglycemic; thought to have undifferentiated shock, acute liver failure, AKI. Required ICU for stabilization. 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 Transition to comfort care 12/19. PMHx: HTN, HLD, CVA left hemiparesis, CAPD, DVT, IVH.    PT Comments  Pt not progressing with mobility, limited by impaired cognition and decreased desire to participate. Pt received with urine incontinence, actively resisting attempts at bed mobility, including rolling and repositioning. Pt initially stating, Do whatever you need to do... but when starting to assist mobility, pt stating, Do not touch me... I'm not doing that... no you won't despite multiple attempts at explaining actions and reasons for mobility. Based on history of multiple similar interactions with therapies and decreased progress towards functional mobility goals, acute PT to sign off; confirmed this with Child Psychotherapist. Please reconsult if new needs arise and able to meaningfully participate. Recommend maximove lift for OOB transfers with nursing.    If plan is discharge home, recommend the following: Two people to help with walking and/or transfers;A lot of help with bathing/dressing/bathroom;Supervision due to cognitive status;Direct supervision/assist for medications management;Direct supervision/assist for financial management;Assistance  with feeding;Assistance with cooking/housework;Assist for transportation;Help with stairs or ramp for entrance   Can travel by private vehicle     No  Equipment Recommendations  Hospital bed;Hoyer lift    Recommendations for Other Services       Precautions / Restrictions Precautions Precautions: Fall Recall of Precautions/Restrictions: Impaired Precaution/Restrictions Comments: incontinence Restrictions Weight Bearing Restrictions Per Provider Order: No     Mobility  Bed Mobility Overal bed mobility: Needs Assistance Bed Mobility: Rolling Rolling: Total assist         General bed mobility comments: pt able to move BUE/BLEs in bed but actively resisting multiple attempts for rolling for attempts at pericare/linen change and repositioning    Transfers                        Ambulation/Gait                   Stairs             Wheelchair Mobility     Tilt Bed    Modified Rankin (Stroke Patients Only)       Balance                                            Communication Communication Communication: Impaired  Cognition Arousal: Alert Behavior During Therapy: Flat affect   PT - Cognitive impairments: No family/caregiver present to determine baseline, Orientation, Awareness, Memory, Attention, Initiation, Sequencing, Problem solving, Safety/Judgement                       PT - Cognition Comments: continues to have decreased engagment and poor cooperation during sessions. pt initially states, Do whatever you need  to do... but when it comes to starting to mobilize pt states, Do not touch me... I'm not doing that... no you won't despite multiple attempts at explaining actions and reasons for mobility (including bed soiled and pt needing cleanup/new linens) Following commands: Impaired Following commands impaired: Follows one step commands inconsistently    Cueing    Exercises      General Comments  General comments (skin integrity, edema, etc.): ultimately unable to assist pt with getting cleaned up; NT notified who currently unable to assist but reports pt rolls well for her and she will assist pt getting washed up and new linens from urine incontinence      Pertinent Vitals/Pain Pain Assessment Pain Assessment: Faces Faces Pain Scale: Hurts a little bit Pain Location: generalized, does not specify Pain Descriptors / Indicators: Grimacing Pain Intervention(s): Monitored during session, Limited activity within patient's tolerance    Home Living                          Prior Function            PT Goals (current goals can now be found in the care plan section) Progress towards PT goals: Not progressing toward goals - comment    Frequency    Min 1X/week      PT Plan      Co-evaluation              AM-PAC PT 6 Clicks Mobility   Outcome Measure  Help needed turning from your back to your side while in a flat bed without using bedrails?: Total Help needed moving from lying on your back to sitting on the side of a flat bed without using bedrails?: Total Help needed moving to and from a bed to a chair (including a wheelchair)?: Total Help needed standing up from a chair using your arms (e.g., wheelchair or bedside chair)?: Total Help needed to walk in hospital room?: Total Help needed climbing 3-5 steps with a railing? : Total 6 Click Score: 6    End of Session   Activity Tolerance: Other (comment) (patient limited by impaired cognition) Patient left: in bed;with call bell/phone within reach;with bed alarm set;with nursing/sitter in room Nurse Communication: Mobility status;Need for lift equipment PT Visit Diagnosis: Unsteadiness on feet (R26.81);Muscle weakness (generalized) (M62.81);Other abnormalities of gait and mobility (R26.89)     Time: 9167-9154 PT Time Calculation (min) (ACUTE ONLY): 13 min  Charges:    $Therapeutic Activity: 8-22  mins PT General Charges $$ ACUTE PT VISIT: 1 Visit                      Darice Almas, PT, DPT Acute Rehabilitation Services  Personal: Secure Chat Rehab Office: [phone]  Darice LITTIE Almas 05/20/2024, 10:22 AM
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
## 2024-05-21 DIAGNOSIS — K7200 Acute and subacute hepatic failure without coma: Secondary | ICD-10-CM | POA: Diagnosis not present

## 2024-05-21 NOTE — Progress Notes (Signed)
 "                        PROGRESS NOTE        PATIENT DETAILS Name: [name] Age: 79 y.o. Sex: female Date of Birth: Jul 30, 1945 Admit Date: 04/13/2024 Admitting Physician Tamela Stakes, MD ERE:Bzmryzrx, Rojelio PARAS, NP  Brief Summary: Patient is a 79 y.o.  female with history of CVA, COPD, HTN-who was brought to the ED after she was found by her roommate unresponsive (no heat/light-under a single blanket)-she was apparently covered in feces/dried blood-found to be hypothermic/hypotensive/hypoglycemic-she was thought to have undifferentiated shock-acute liver failure-acute kidney injury-admitted to the ICU-stabilized with pressors/D10 infusion/broad-spectrum antibiotics-rapidly improved and subsequently transferred to [facility].  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  Unfortunately-in spite of being on IV antibiotics-other supportive measures-an extended hospitalization-patient did not have any significant clinical improvement-oral intake continues to be erratic and mostly poor-palliative care was involved-after extensive discussion with patient's son-was transition to comfort measures on 12/29.  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 12/8>> ANA: Positive 12/8>> anti-smooth muscle antibody: Negative  Significant microbiology data: 12/6>> COVID/influenza/RSV PCR: Negative 12/6>> blood culture: No growth 12/6>> acute hepatitis serology: Negative 12/6>> CMV DNA PCR: Negative 12/9>> blood culture: No growth  Procedures: Line insertion 05/04/2024  Consults: PCCM GI ID Hematology [name]) Palliative care  Subjective: Patient in bed sleeping, arousable, denies any headache or chest pain  Objective: Vitals: Blood pressure 133/63, pulse (!) 58, temperature 98.3 F (36.8 C), temperature source Axillary, resp. rate 18, height 5' 10 (1.778 m), weight 79.4 kg, SpO2 (!) 89%.   Exam: Sleeping comfortably-not in any distress Easily arouses-pleasantly confused Left-sided hemiparesis.    Assessment/Plan: Goals of care Palliative input greatly appreciated, overall patient noncompliant, does appear with dementia/cognitive issues, does not  have capacity, palliative medicine and APS involved, continues to have very poor oral intake, very poor life quality, significant psychosocial issues, at this point decision has been made to to focus on comfort, and foregoing life-prolonging measures.  Awaiting appropriate disposition-either residential hospice or SNF.  Her oral intake is mostly poor-per nursing staff-she is only eating a small portion of her meals.  Evaluated by hospice-not a candidate for residential hospice-discussed with TOC if we can arrange for SNF with hospice follow-up instead.    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  Due to thrombocytopenia-TEE was not pursued-however it was felt that  since patient would be treated for 6 weeks-TEE would not change management.  PICC line was placed on 12/27.  Given ANA positive status-nonbacterial thrombotic endocarditis is also possible.   Unfortunately-no significant clinical improvement-has been followed by  palliative care services closely-after discussion with patient's son-all antibiotics was discontinued 12/29-with focus mostly on comfort.  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   Treated with 2 doses of IVIG on 12/12 and 12/13 without any significant improvement-subsequently given Nplate  on 12/14 and 12/19-thankfully platelet counts are now obtained during  Normocytic anemia   Likely secondary to critical illness-no evidence of blood loss/GI bleeding Required 2 units of PRBC so far  SVT on 12/15 Required adenosine  Currently stable on beta-blocker TSH stable.    Acute metabolic encephalopathy-likely superimposed on underlying dementia. Hospital delirium Acute metabolic encephalopathy initially felt to be secondary to possible sepsis physiology/liver failure-however continued to have waxing/waning mentation-even after treatment of underlying sepsis physiology/improvement in liver function- at this point is secondary to hospital-acquired delirium-probably superimposed on some amount of undiagnosed dementia.  Evaluated by psychiatry-does not have capacity Started on Remeron  and Seroquel  Has now been transition to full comfort measures.  Hypokalemia Replaced  Hypothermia Likely secondary to environmental exposure (per prior notes- cool room with single blanket-no heating or light) TSH stable Now normothermic after supportive care  Acute liver failure Unclear etiology-possibly shock liver from hypotension from possible sepsis  LFTs/coags improving Dopplers without portal/hepatic vein thrombosis Acute hepatitis serology negative CMV IgM positive but viral load  negative  Negative workup for autoimmune hepatitis completed N-acetylcysteine  12/10 Empirically on lactulose  Gastroenterology input appreciated  Oropharyngeal dysphagia Secondary to critical illness/encephalopathy SLP following-on dysphagia 2 diet.  AKI Hemodynamically mediated due to hypotension Resolved.  Hypokalemia/Hypophosphatemia Repleted  Metabolic acidosis Likely due to delayed clearance of lactic acid-in the setting of liver failure. Improved with supportive care  Self-limited episode-small-volume coffee-ground emesis Hb stable PPI By GI, resolved, no procedures.  Chest pain Likely atypical Resolved  Minimally elevated troponins Trend is flat Likely demand ischemia in the setting of hypotension/critical illness. Echo with stable EF  HTN BP stable, low-dose beta-blocker and Norvasc .  History of chronic HFpEF Status stable  History of CVA Baseline left hemiparesis Antiplatelets/statin held-in the setting of elevated liver enzymes and thrombocytopenia.  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  It is not felt that she has severe failure to thrive syndrome-extremely poor oral intake that is the primary cause of her hypoglycemia.  Unfortunately-in spite of adding Remeron -she has not significantly improved.  See above discussion regarding goals of care-now full comfort measures.  Pressure Ulcer: Agree with assessment and plan as outlined below Wound 04/14/24 0100 Pressure Injury Hip Left Stage 2 -   Partial thickness loss of dermis presenting as a shallow open injury with a red, pink wound bed without slough. (Active)     Wound 04/14/24 0100 Pressure Injury Buttocks Left;Right Unstageable - Full thickness tissue loss in which the base of the injury is covered by slough (yellow, tan, gray, green or brown) and/or eschar (tan, brown or black) in the wound bed. (Active)     Wound 04/14/24 0100 Pressure Injury Sacrum Medial Stage 2 -  Partial thickness loss of dermis presenting as a shallow open injury with a red, pink wound bed without slough. (Active)    Code status:  Code Status: Limited: Do not attempt resuscitation (DNR) -DNR-LIMITED -Do Not Intubate/DNI    DVT Prophylaxis: SCDs Start: 04/13/24 2344    Family Communication: None at bedside.  Disposition Plan: Status is: Inpatient     Diet: Diet Order             DIET DYS 3 Room service appropriate? Yes; Fluid consistency: Thin  Diet effective now                     Data Review:   Patient Lines/Drains/Airways Status     Active Line/Drains/Airways     Name Placement date Placement time Site Days   PICC Single Lumen 05/04/24 Right Basilic 36 cm 0 cm 05/04/24  8676  Basilic  17   External Urinary Catheter 04/29/24  0553  --  22   Wound 04/14/24 0100 Pressure Injury Hip Left Stage 2 -  Partial thickness loss of dermis presenting as a shallow open injury with a red, pink wound bed without slough. 04/14/24  0100  Hip  37   Wound 04/14/24 0100 Pressure Injury Buttocks Left;Right Unstageable - Full thickness tissue loss in which the base of the injury is covered by slough (yellow, tan, gray, green or brown) and/or eschar (tan, brown or black) in the wound bed. 04/14/24  0100  Buttocks  37   Wound 04/14/24 0100 Pressure Injury Sacrum Medial Stage 2 -  Partial thickness loss of dermis presenting as a shallow open injury with a red, pink wound bed without slough. 04/14/24  0100  Sacrum  37             Inpatient  Medications  Scheduled Meds:  Chlorhexidine  Gluconate Cloth  6 each Topical Daily   feeding supplement  237 mL Oral TID BM   Gerhardt's butt cream   Topical BID   lactulose   20 g Oral BID   mirtazapine   30 mg Oral QHS   mouth rinse  15 mL Mouth Rinse 4 times per day   pantoprazole   40 mg Oral BID   QUEtiapine   25 mg Oral BID   sodium chloride  flush  10-40 mL Intracatheter Q12H   Continuous Infusions:   PRN Meds:.bisacodyl , diphenhydrAMINE , docusate, HYDROcodone -acetaminophen , ipratropium-albuterol , LORazepam , melatonin, ondansetron  (ZOFRAN ) IV, mouth rinse, polyethylene glycol, QUEtiapine , sodium chloride  flush  DVT Prophylaxis  SCDs Start: 04/13/24 2344   Recent Labs  Lab 05/19/24 0331  WBC 4.0  HGB 9.1*  HCT 28.1*  PLT 94*  MCV 99.3  MCH 32.2  MCHC 32.4  RDW 18.2*    Recent Labs  Lab 05/19/24 0331  NA 140  K 3.6  CL 105  CO2 28  ANIONGAP 7  GLUCOSE 78  BUN 17  CREATININE 0.47  CALCIUM  8.3*       Recent Labs  Lab 05/19/24 0331  CALCIUM  8.3*     --------------------------------------------------------------------------------------------------------------- Lab Results  Component Value Date   CHOL 288 (H) 05/30/2023   HDL 78 05/30/2023   LDLCALC 165 (H) 05/30/2023   TRIG 248 (H) 05/30/2023   CHOLHDL 3.7 05/30/2023    Lab Results  Component Value Date   HGBA1C 5.3 09/11/2020   No results for input(s): TSH, T4TOTAL, FREET4, T3FREE, THYROIDAB in the last 72 hours.  No results for input(s): VITAMINB12, FOLATE, FERRITIN, TIBC, IRON, RETICCTPCT in the last 72 hours. ------------------------------------------------------------------------------------------------------------------ Cardiac Enzymes No results for input(s): CKMB, TROPONINI, MYOGLOBIN in the last 168 hours.  Invalid input(s): CK  Micro Results No results found for this or  any previous visit (from the past 240 hours).   Radiology Reports  No  results found.    Signature  -   Lavada Stank M.D on 05/21/2024 at 9:31 AM   -  To page go to [email or web address]  "

## 2024-05-21 NOTE — Plan of Care (Signed)
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
## 2024-05-21 NOTE — Progress Notes (Signed)
 Occupational Therapy Treatment Patient Details Name: [name] MRN: [id number] DOB: 05-02-1946 Today's Date: 05/21/2024   History of present illness Pt is a 79 y.o. F who presented to [facility] ED 04/13/24 via EMS after she was found by her roommate unresponsive (no heat/light-under a single blanket) covered feces/dried blood. Found to be hypothermic, hypotensive, hypoglycemic; thought to have undifferentiated shock, acute liver failure, AKI. Required ICU for stabilization. 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 Transition to comfort care 12/19. PMHx: HTN, HLD, CVA left hemiparesis, CAPD, DVT, IVH.   OT comments  Patient received in supine and agreeable to OT session to address grooming and self feeding. Patient was max assist to position in bed to allow for self feeding. Patient able to wash face and hands with setup and setup with supervision for patient to feed self with 25% spillage and cues to stay on task. OT to sign off on patient due to limited progress limited participation with OOB OT.       If plan is discharge home, recommend the following:  Two people to help with bathing/dressing/bathroom;Two people to help with walking and/or transfers;Assistance with feeding;Direct supervision/assist for financial management;Assistance with cooking/housework;Direct supervision/assist for medications management;Supervision due to cognitive status   Equipment Recommendations  Other (comment) (defer)    Recommendations for Other Services      Precautions / Restrictions Precautions Precautions: Fall Recall of Precautions/Restrictions: Impaired Precaution/Restrictions Comments: incontinence Restrictions Weight Bearing Restrictions Per Provider Order: No        Mobility Bed Mobility Overal bed mobility: Needs Assistance Bed Mobility: Rolling Rolling: Max assist         General bed mobility comments: to address positioning in bed prior to self feeding    Transfers                         Balance                                           ADL either performed or assessed with clinical judgement   ADL Overall ADL's : Needs assistance/impaired Eating/Feeding: Supervision/ safety;Set up;Bed level Eating/Feeding Details (indicate cue type and reason): assistance with setup and supervision to stay on task Grooming: Wash/dry hands;Wash/dry face;Set up;Bed level Grooming Details (indicate cue type and reason): performed before addressing self feeding                                    Extremity/Trunk Assessment              Vision       Perception     Praxis     Communication Communication Communication: Impaired Factors Affecting Communication: Hearing impaired   Cognition Arousal: Alert Behavior During Therapy: Flat affect Cognition: No family/caregiver present to determine baseline                               Following commands: Impaired Following commands impaired: Follows one step commands inconsistently, Follows one step commands with increased time      Cueing  Cueing Techniques: Verbal cues, Tactile cues, Visual cues  Exercises      Shoulder Instructions       General Comments VSS on RA    Pertinent Vitals/ Pain       Pain Assessment Pain Assessment: No/denies pain  Home Living                                          Prior Functioning/Environment              Frequency  Min 1X/week        Progress Toward Goals  OT Goals(current goals can now be found in the care plan section)  Progress towards OT goals: Progressing toward goals     Plan      Co-evaluation                 AM-PAC OT 6  Clicks Daily Activity     Outcome Measure   Help from another person eating meals?: A Little Help from another person taking care of personal grooming?: A Lot Help from another person toileting, which includes using toliet, bedpan, or urinal?: Total Help from another person bathing (including washing, rinsing, drying)?: A Lot Help from another person to put on and taking off regular upper body clothing?: Total Help from another person to put on and taking off regular lower body clothing?: Total 6 Click Score: 10    End of Session    OT Visit Diagnosis: Other abnormalities of gait and mobility (R26.89);Muscle weakness (generalized) (M62.81);Hemiplegia and hemiparesis;Pain Hemiplegia - Right/Left: Left Hemiplegia - dominant/non-dominant: Non-Dominant   Activity Tolerance Patient tolerated treatment well   Patient Left in bed;with call bell/phone within reach;with bed alarm set   Nurse Communication Mobility status;Other (comment) (able to feed self with supervision)        Time: 9073-9049 OT Time Calculation (min): 24 min  Charges: OT General Charges $OT Visit: 1 Visit OT Treatments $Self Care/Home Management : 23-37 mins  Dick Laine, OTA Acute Rehabilitation Services  Office [phone]   Jeb LITTIE Laine 05/21/2024, 9:55 AM

## 2024-05-22 ENCOUNTER — Inpatient Hospital Stay: Payer: Self-pay | Admitting: Infectious Diseases

## 2024-05-22 DIAGNOSIS — K7200 Acute and subacute hepatic failure without coma: Secondary | ICD-10-CM | POA: Diagnosis not present

## 2024-05-22 MED ORDER — AMLODIPINE BESYLATE 10 MG PO TABS
10.0000 mg | ORAL_TABLET | Freq: Every day | ORAL | Status: AC
  Administered 2024-05-22 – 2024-06-12 (×17): 10 mg via ORAL
  Filled 2024-05-22 (×21): qty 1

## 2024-05-22 NOTE — Progress Notes (Signed)
 "                        PROGRESS NOTE        PATIENT DETAILS Name: [name] Age: 79 y.o. Sex: female Date of Birth: 06-May-1946 Admit Date: 04/13/2024 Admitting Physician Tamela Stakes, MD ERE:Bzmryzrx, Rojelio PARAS, NP  Brief Summary: Patient is a 79 y.o.  female with history of CVA, COPD, HTN-who was brought to the ED after she was found by her roommate unresponsive (no heat/light-under a single blanket)-she was apparently covered in feces/dried blood-found to be hypothermic/hypotensive/hypoglycemic-she was thought to have undifferentiated shock-acute liver failure-acute kidney injury-admitted to the ICU-stabilized with pressors/D10 infusion/broad-spectrum antibiotics-rapidly improved and subsequently transferred to [facility].  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  Unfortunately-in spite of being on IV antibiotics-other supportive measures-an extended hospitalization-patient did not have any significant clinical improvement-oral intake continues to be erratic and mostly poor-palliative care was involved-after extensive discussion with patient's son-was transition to comfort measures on 12/29.  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 12/8>> ANA: Positive 12/8>> anti-smooth muscle antibody: Negative  Significant microbiology data: 12/6>> COVID/influenza/RSV PCR: Negative 12/6>> blood culture: No growth 12/6>> acute hepatitis serology: Negative 12/6>> CMV DNA PCR: Negative 12/9>> blood culture: No growth  Procedures: Line insertion 05/04/2024  Consults: PCCM GI ID Hematology [name]) Palliative care  Subjective: Patient in bed, denies any headache or chest pain, says she is eating well.  No shortness of breath.  Objective: Vitals: Blood pressure (!) 146/98, pulse 64, temperature 98.2 F (36.8 C), temperature source Oral, resp. rate 18, height 5' 10 (1.778 m), weight 79.4 kg, SpO2 (!) 89%.   Exam: Sleeping comfortably-not in any distress Easily arouses-pleasantly confused Left-sided hemiparesis.    Assessment/Plan: Goals of care Palliative input greatly appreciated, overall patient noncompliant, does appear with dementia/cognitive issues, does not  have capacity, palliative medicine and APS involved, continues to have very poor oral intake, very poor life quality, significant psychosocial issues, at this point decision has been made to to focus on comfort, and foregoing life-prolonging measures.  Awaiting appropriate disposition-either residential hospice or SNF.  Her oral intake is mostly poor-per nursing staff-she is only eating a small portion of her meals.  Evaluated by hospice-not a candidate for residential hospice-discussed with TOC if we can arrange for SNF with hospice follow-up instead.    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  Due to thrombocytopenia-TEE was not  pursued-however it was felt that since patient would be treated for 6 weeks-TEE would not change management.  PICC line was placed on 12/27.  Given ANA positive status-nonbacterial thrombotic endocarditis is also possible.  Unfortunately-no significant clinical improvement-has been followed by palliative care services closely-after discussion with patient's son-all antibiotics was discontinued 12/29-with focus mostly on comfort.  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   Treated with 2 doses of IVIG on 12/12 and 12/13 without any significant improvement-subsequently given Nplate  on 12/14 and 12/19-thankfully platelet counts are now obtained during  Normocytic anemia   Likely secondary to critical illness-no evidence of blood loss/GI bleeding Required 2 units of PRBC so far  SVT on 12/15 Required adenosine  Currently stable on beta-blocker TSH stable.    Acute metabolic encephalopathy-likely superimposed on underlying dementia. Hospital delirium Acute metabolic encephalopathy initially felt to be secondary to possible sepsis physiology/liver failure-however continued to have waxing/waning mentation-even after treatment of underlying sepsis physiology/improvement in liver function- at this point is secondary to hospital-acquired delirium-probably superimposed on some amount of undiagnosed dementia.  Evaluated by psychiatry-does not have capacity Started on Remeron  and Seroquel  Has now been transition to full comfort measures.  Hypokalemia Replaced  Hypothermia Likely secondary to environmental exposure (per prior notes- cool room with single blanket-no heating or light) TSH stable Now normothermic after supportive care  Acute liver failure Unclear etiology-possibly shock liver from hypotension from possible sepsis  LFTs/coags improving Dopplers without portal/hepatic vein thrombosis Acute hepatitis serology  negative CMV IgM positive but viral load negative  Negative workup for autoimmune hepatitis completed N-acetylcysteine  12/10 Empirically on lactulose  Gastroenterology input appreciated  Oropharyngeal dysphagia Secondary to critical illness/encephalopathy SLP following-on dysphagia 2 diet.  AKI Hemodynamically mediated due to hypotension Resolved.  Hypokalemia/Hypophosphatemia Repleted  Metabolic acidosis Likely due to delayed clearance of lactic acid-in the setting of liver failure. Improved with supportive care  Self-limited episode-small-volume coffee-ground emesis Hb stable PPI By GI, resolved, no procedures.  Chest pain Likely atypical Resolved  Minimally elevated troponins Trend is flat Likely demand ischemia in the setting of hypotension/critical illness. Echo with stable EF  HTN BP stable, add  Norvasc  and monitor.  History of chronic HFpEF Status stable  History of CVA Baseline left hemiparesis Antiplatelets/statin held-in the setting of elevated liver enzymes and thrombocytopenia.  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  It is not felt that she has severe failure to thrive syndrome-extremely poor oral intake that is the primary cause of her hypoglycemia.  Unfortunately-in spite of adding Remeron -she has not significantly improved.  See above discussion regarding goals of care-now full comfort measures.  Pressure Ulcer: Agree with assessment and plan as outlined below Wound 04/14/24 0100  Pressure Injury Hip Left Stage 2 -  Partial thickness loss of dermis presenting as a shallow open injury with a red, pink wound bed without slough. (Active)     Wound 04/14/24 0100 Pressure Injury Buttocks Left;Right Unstageable - Full thickness tissue loss in which the base of the injury is covered by slough (yellow, tan, gray, green or brown) and/or eschar (tan, brown or black) in the wound bed. (Active)     Wound 04/14/24 0100 Pressure Injury Sacrum Medial Stage 2 -  Partial thickness loss of dermis presenting as a shallow open injury with a red, pink wound bed without  slough. (Active)    Code status:   Code Status: Limited: Do not attempt resuscitation (DNR) -DNR-LIMITED -Do Not Intubate/DNI    DVT Prophylaxis: SCDs Start: 04/13/24 2344    Family Communication: None at bedside.  Disposition Plan: Status is: Inpatient     Diet: Diet Order             DIET DYS 3 Room service appropriate? Yes; Fluid consistency: Thin  Diet effective now                     Data Review:   Patient Lines/Drains/Airways Status     Active Line/Drains/Airways     Name Placement date Placement time Site Days   PICC Single Lumen 05/04/24 Right Basilic 36 cm 0 cm 05/04/24  8676  Basilic  18   External Urinary Catheter 04/29/24  0553  --  23   Wound 04/14/24 0100 Pressure Injury Hip Left Stage 2 -  Partial thickness loss of dermis presenting as a shallow open injury with a red, pink wound bed without slough. 04/14/24  0100  Hip  38   Wound 04/14/24 0100 Pressure Injury Buttocks Left;Right Unstageable - Full thickness tissue loss in which the base of the injury is covered by slough (yellow, tan, gray, green or brown) and/or eschar (tan, brown or black) in the wound bed. 04/14/24  0100  Buttocks  38   Wound 04/14/24 0100 Pressure Injury Sacrum Medial Stage 2 -  Partial thickness loss of dermis presenting as a shallow open injury with a red, pink wound bed without slough. 04/14/24  0100  Sacrum  38              Inpatient Medications  Scheduled Meds:  Chlorhexidine  Gluconate Cloth  6 each Topical Daily   feeding supplement  237 mL Oral TID BM   Gerhardt's butt cream   Topical BID   lactulose   20 g Oral BID   mirtazapine   30 mg Oral QHS   mouth rinse  15 mL Mouth Rinse 4 times per day   pantoprazole   40 mg Oral BID   QUEtiapine   25 mg Oral BID   sodium chloride  flush  10-40 mL Intracatheter Q12H   Continuous Infusions:   PRN Meds:.bisacodyl , diphenhydrAMINE , docusate, HYDROcodone -acetaminophen , ipratropium-albuterol , LORazepam , melatonin, ondansetron  (ZOFRAN ) IV, mouth rinse, polyethylene glycol, QUEtiapine , sodium chloride  flush  DVT Prophylaxis  SCDs Start: 04/13/24 2344   Recent Labs  Lab 05/19/24 0331  WBC 4.0  HGB 9.1*  HCT 28.1*  PLT 94*  MCV 99.3  MCH 32.2  MCHC 32.4  RDW 18.2*    Recent Labs  Lab 05/19/24 0331  NA 140  K 3.6  CL 105  CO2 28  ANIONGAP 7  GLUCOSE 78  BUN 17  CREATININE 0.47  CALCIUM  8.3*       Recent Labs  Lab 05/19/24 0331  CALCIUM  8.3*     --------------------------------------------------------------------------------------------------------------- Lab Results  Component Value Date   CHOL 288 (H) 05/30/2023   HDL 78 05/30/2023   LDLCALC 165 (H) 05/30/2023   TRIG 248 (H) 05/30/2023   CHOLHDL 3.7 05/30/2023    Lab Results  Component Value Date   HGBA1C 5.3 09/11/2020   No results for input(s): TSH, T4TOTAL, FREET4, T3FREE, THYROIDAB in the last 72 hours.  No results for input(s): VITAMINB12, FOLATE, FERRITIN, TIBC, IRON, RETICCTPCT in the last 72 hours. ------------------------------------------------------------------------------------------------------------------ Cardiac Enzymes No results for input(s): CKMB, TROPONINI, MYOGLOBIN in the last 168 hours.  Invalid input(s): CK  Micro Results No results found for this or any previous visit (from the past 240  hours).   Radiology Reports  No results found.    Signature  -   Lavada Stank M.D on 05/22/2024 at 8:48 AM   -  To page go to [email or web address]  "

## 2024-05-22 NOTE — Plan of Care (Signed)

## 2024-05-22 NOTE — Plan of Care (Signed)
   Problem: Coping: Goal: Level of anxiety will decrease Outcome: Progressing   Problem: Pain Managment: Goal: General experience of comfort will improve and/or be controlled Outcome: Progressing

## 2024-05-23 DIAGNOSIS — K7200 Acute and subacute hepatic failure without coma: Secondary | ICD-10-CM | POA: Diagnosis not present

## 2024-05-23 NOTE — Progress Notes (Signed)
 "                        PROGRESS NOTE        PATIENT DETAILS Name: [name] Age: 79 y.o. Sex: female Date of Birth: [birth year] Admit Date: 04/13/2024 Admitting Physician Tamela Stakes, MD ERE:Bzmryzrx, Rojelio PARAS, NP  Brief Summary: Patient is a 79 y.o.  female with history of CVA, COPD, HTN-who was brought to the ED after she was found by her roommate unresponsive (no heat/light-under a single blanket)-she was apparently covered in feces/dried blood-found to be hypothermic/hypotensive/hypoglycemic-she was thought to have undifferentiated shock-acute liver failure-acute kidney injury-admitted to the ICU-stabilized with pressors/D10 infusion/broad-spectrum antibiotics-rapidly improved and subsequently transferred to [facility].  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  Unfortunately-in spite of being on IV antibiotics-other supportive measures-an extended hospitalization-patient did not have any significant clinical improvement-oral intake continues to be erratic and mostly poor-palliative care was involved-after extensive discussion with patient's son-was transition to comfort measures on 12/29.  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 12/8>> ANA: Positive 12/8>> anti-smooth muscle antibody: Negative  Significant microbiology data: 12/6>> COVID/influenza/RSV PCR: Negative 12/6>> blood culture: No growth 12/6>> acute hepatitis serology: Negative 12/6>> CMV DNA PCR: Negative 12/9>> blood culture: No growth  Procedures: Line insertion 05/04/2024  Consults: PCCM GI ID Hematology [name]) Palliative care  Subjective: Patient in bed, appears comfortable, denies any headache, no fever, no chest pain or pressure, no shortness of breath , no abdominal pain. No focal weakness.  Objective: Vitals: Blood pressure (!) 125/57, pulse (!) 57, temperature 98 F (36.7 C), temperature source Axillary, resp. rate 17, height 5' 10 (1.778 m), weight 79.4 kg, SpO2 100%.   Exam: Sleeping comfortably-not in any distress Easily arouses-pleasantly confused Left-sided hemiparesis.    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  Due to thrombocytopenia-TEE was not pursued-however it was felt that since patient would be treated for 6 weeks-TEE would not change management.  PICC line was placed on 12/27.  Given ANA positive status-nonbacterial thrombotic endocarditis is also possible.   Unfortunately-no significant clinical improvement-has been followed by palliative care services closely-after discussion with patient's son-all antibiotics was discontinued 12/29-with focus mostly on comfort.    After patient was transition to full comfort care she has remained clinically stable and in fact started showing some signs of improvement around 05/18/2024, afebrile, no signs of active infection.  She  remains DNR with plan being to continue gentle medical treatment and look for appropriate placement.  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   Treated with 2 doses of IVIG  on 12/12 and 12/13 without any significant improvement-subsequently given Nplate  on 12/14 and 12/19-thankfully platelet counts now improved  Normocytic anemia   Likely secondary to critical illness-no evidence of blood loss/GI bleeding Required 2 units of PRBC so far  SVT on 12/15 Required adenosine  Currently stable on beta-blocker TSH stable.    Acute metabolic encephalopathy-likely superimposed on underlying dementia. Hospital delirium Acute metabolic encephalopathy initially felt to be secondary to possible sepsis physiology/liver failure-however continued to have waxing/waning mentation-even after treatment of underlying sepsis physiology/improvement in liver function- at this point is secondary to hospital-acquired delirium-probably superimposed on some amount of undiagnosed dementia.  Evaluated by psychiatry-does not have capacity Started on Remeron  and Seroquel  Has now been transition to full comfort measures.  Hypokalemia Replaced  Hypothermia Likely secondary to environmental exposure (per prior notes- cool room with single blanket-no heating or light) TSH stable Now normothermic after supportive care  Acute liver failure Unclear etiology-possibly shock liver from hypotension from possible sepsis  LFTs/coags improving Dopplers without portal/hepatic vein thrombosis Acute hepatitis serology negative CMV IgM positive but viral load negative  Negative workup for autoimmune hepatitis completed N-acetylcysteine  12/10 Empirically on lactulose  Gastroenterology input appreciated  Oropharyngeal dysphagia Secondary to critical illness/encephalopathy SLP following-on dysphagia 2 diet.  AKI Hemodynamically mediated due to  hypotension Resolved.  Hypokalemia/Hypophosphatemia Repleted  Metabolic acidosis Likely due to delayed clearance of lactic acid-in the setting of liver failure. Improved with supportive care  Self-limited episode-small-volume coffee-ground emesis Hb stable PPI By GI, resolved, no procedures.  Chest pain Likely atypical Resolved  Minimally elevated troponins Trend is flat Likely demand ischemia in the setting of hypotension/critical illness. Echo with stable EF  HTN BP stable, add  Norvasc  and monitor.  History of chronic HFpEF Status stable  History of CVA Baseline left hemiparesis Antiplatelets/statin held-in the setting of elevated liver enzymes and thrombocytopenia.  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  It is not felt that she has severe failure to thrive syndrome-extremely poor oral intake that is the primary cause of her hypoglycemia.  Unfortunately-in spite of adding Remeron -she has not significantly improved.  See above discussion regarding goals of care-now full comfort measures.  Pressure Ulcer: Agree with assessment and plan as outlined below Wound 04/14/24 0100 Pressure Injury Hip Left Stage 2 -  Partial thickness loss of dermis presenting as a shallow open injury with a red, pink wound bed without slough. (Active)     Wound 04/14/24 0100 Pressure Injury Buttocks Left;Right Unstageable - Full thickness tissue loss in which the base of the injury is covered by slough (yellow, tan, gray, green or  brown) and/or eschar (tan, brown or black) in the wound bed. (Active)     Wound 04/14/24 0100 Pressure Injury Sacrum Medial Stage 2 -  Partial thickness loss of dermis presenting as a shallow open injury with a red, pink wound bed without slough. (Active)    Code status:   Code Status: Limited: Do not attempt resuscitation (DNR) -DNR-LIMITED -Do Not Intubate/DNI    DVT Prophylaxis: SCDs Start: 04/13/24 2344    Family Communication: None at bedside.  Disposition Plan: Status is: Inpatient  Diet: Diet Order             DIET DYS 3 Room service appropriate? Yes; Fluid consistency: Thin  Diet effective now                     Data Review:   Patient Lines/Drains/Airways Status     Active Line/Drains/Airways     Name Placement date Placement time Site Days   PICC Single Lumen 05/04/24 Right Basilic 36 cm 0 cm 05/04/24  8676  Basilic  19   External Urinary Catheter 04/29/24  0553  --  24   Wound 04/14/24 0100 Pressure Injury Hip Left Stage 2 -  Partial thickness loss of dermis presenting as a shallow open injury with a red, pink wound bed without slough. 04/14/24  0100  Hip  39   Wound 04/14/24 0100 Pressure Injury Buttocks Left;Right Unstageable - Full thickness tissue loss in which the base of the injury is covered by slough (yellow, tan, gray, green or brown) and/or eschar (tan, brown or black) in the wound bed. 04/14/24  0100  Buttocks  39   Wound 04/14/24 0100 Pressure Injury Sacrum Medial Stage 2 -  Partial thickness loss of dermis presenting as a shallow open injury with a red, pink wound bed without slough. 04/14/24  0100  Sacrum  39             Inpatient Medications  Scheduled Meds:  amLODipine   10 mg Oral Daily   Chlorhexidine  Gluconate Cloth  6 each Topical Daily   feeding supplement  237 mL Oral TID BM   Gerhardt's butt cream   Topical BID   lactulose   20 g Oral BID   mirtazapine   30 mg Oral QHS   mouth rinse  15 mL Mouth Rinse 4 times per day    pantoprazole   40 mg Oral BID   QUEtiapine   25 mg Oral BID   sodium chloride  flush  10-40 mL Intracatheter Q12H   Continuous Infusions:   PRN Meds:.bisacodyl , diphenhydrAMINE , docusate, HYDROcodone -acetaminophen , ipratropium-albuterol , LORazepam , melatonin, ondansetron  (ZOFRAN ) IV, mouth rinse, polyethylene glycol, QUEtiapine , sodium chloride  flush  DVT Prophylaxis  SCDs Start: 04/13/24 2344   Recent Labs  Lab 05/19/24 0331  WBC 4.0  HGB 9.1*  HCT 28.1*  PLT 94*  MCV 99.3  MCH 32.2  MCHC 32.4  RDW 18.2*    Recent Labs  Lab 05/19/24 0331  NA 140  K 3.6  CL 105  CO2 28  ANIONGAP 7  GLUCOSE 78  BUN 17  CREATININE 0.47  CALCIUM  8.3*       Recent Labs  Lab 05/19/24 0331  CALCIUM  8.3*     --------------------------------------------------------------------------------------------------------------- Lab Results  Component Value Date   CHOL 288 (H) 05/30/2023   HDL 78 05/30/2023   LDLCALC 165 (H) 05/30/2023   TRIG 248 (H) 05/30/2023   CHOLHDL 3.7 05/30/2023    Lab Results  Component Value Date   HGBA1C 5.3 09/11/2020   No results for input(s): TSH, T4TOTAL, FREET4, T3FREE, THYROIDAB in the last 72 hours.  No results for input(s): VITAMINB12, FOLATE, FERRITIN, TIBC, IRON, RETICCTPCT in the last 72 hours. ------------------------------------------------------------------------------------------------------------------ Cardiac Enzymes No results for input(s): CKMB, TROPONINI, MYOGLOBIN in the last 168 hours.  Invalid input(s): CK  Micro Results No results found for this or any previous visit (from the past 240 hours).   Radiology Reports  No results found.    Signature  -   Lavada Stank M.D on 05/23/2024 at 8:37  AM   -  To page go to [email or web address]  "

## 2024-05-23 NOTE — TOC Progression Note (Signed)
 Transition of Care [facility]) - Progression Note    Patient Details  Name: [name] MRN: [id number] Date of Birth: 06-23-1945  [facility] CM/SW Contact  [name], LCSW Phone Number: 05/23/2024, 2:10 PM  Clinical Narrative:    Everardo served [employer] hearing notice. Camilla with [employer] contacting the business office at [facility].    Expected Discharge Plan: Skilled Nursing Facility Barriers to Discharge: Insurance Authorization               Expected Discharge Plan and Services In-house Referral: Clinical Social Work   Post Acute Care Choice: Skilled Nursing Facility Living arrangements for the past 2 months: Single Family Home                                       Social Drivers of Health (SDOH) Interventions SDOH Screenings   Food Insecurity: Food Insecurity Present (04/15/2024)  Housing: High Risk (04/22/2024)  Transportation Needs: Unmet Transportation Needs (04/15/2024)  Utilities: At Risk (04/15/2024)  Depression (PHQ2-9): Low Risk (06/04/2021)  Social Connections: Unknown (04/15/2024)  Tobacco Use: Medium Risk (05/10/2024)    Readmission Risk Interventions    01/15/2024    2:24 PM  Readmission Risk Prevention Plan  Transportation Screening Complete  HRI or Home Care Consult Complete  Social Work Consult for Recovery Care Planning/Counseling Complete  Palliative Care Screening Not Applicable  Medication Review Oceanographer) Complete

## 2024-05-24 DIAGNOSIS — K7200 Acute and subacute hepatic failure without coma: Secondary | ICD-10-CM | POA: Diagnosis not present

## 2024-05-24 LAB — CBC WITH DIFFERENTIAL/PLATELET
Abs Immature Granulocytes: 0.01 K/uL (ref 0.00–0.07)
Basophils Absolute: 0 K/uL (ref 0.0–0.1)
Basophils Relative: 0 %
Eosinophils Absolute: 0.2 K/uL (ref 0.0–0.5)
Eosinophils Relative: 5 %
HCT: 29 % — ABNORMAL LOW (ref 36.0–46.0)
Hemoglobin: 9.4 g/dL — ABNORMAL LOW (ref 12.0–15.0)
Immature Granulocytes: 0 %
Lymphocytes Relative: 38 %
Lymphs Abs: 1.8 K/uL (ref 0.7–4.0)
MCH: 32.6 pg (ref 26.0–34.0)
MCHC: 32.4 g/dL (ref 30.0–36.0)
MCV: 100.7 fL — ABNORMAL HIGH (ref 80.0–100.0)
Monocytes Absolute: 0.7 K/uL (ref 0.1–1.0)
Monocytes Relative: 15 %
Neutro Abs: 1.9 K/uL (ref 1.7–7.7)
Neutrophils Relative %: 42 %
Platelets: 66 K/uL — ABNORMAL LOW (ref 150–400)
RBC: 2.88 MIL/uL — ABNORMAL LOW (ref 3.87–5.11)
RDW: 17.7 % — ABNORMAL HIGH (ref 11.5–15.5)
WBC: 4.6 K/uL (ref 4.0–10.5)
nRBC: 0 % (ref 0.0–0.2)

## 2024-05-24 LAB — COMPREHENSIVE METABOLIC PANEL WITH GFR
ALT: 15 U/L (ref 0–44)
AST: 36 U/L (ref 15–41)
Albumin: 2.8 g/dL — ABNORMAL LOW (ref 3.5–5.0)
Alkaline Phosphatase: 73 U/L (ref 38–126)
Anion gap: 6 (ref 5–15)
BUN: 21 mg/dL (ref 8–23)
CO2: 28 mmol/L (ref 22–32)
Calcium: 8.4 mg/dL — ABNORMAL LOW (ref 8.9–10.3)
Chloride: 108 mmol/L (ref 98–111)
Creatinine, Ser: 0.49 mg/dL (ref 0.44–1.00)
GFR, Estimated: >60 mL/min
Glucose, Bld: 80 mg/dL (ref 70–99)
Potassium: 3.4 mmol/L — ABNORMAL LOW (ref 3.5–5.1)
Sodium: 141 mmol/L (ref 135–145)
Total Bilirubin: 0.4 mg/dL (ref 0.0–1.2)
Total Protein: 6.7 g/dL (ref 6.5–8.1)

## 2024-05-24 LAB — MAGNESIUM: Magnesium: 2 mg/dL (ref 1.7–2.4)

## 2024-05-24 LAB — PROTIME-INR
INR: 1.2 (ref 0.8–1.2)
Prothrombin Time: 16.3 s — ABNORMAL HIGH (ref 11.4–15.2)

## 2024-05-24 MED ORDER — POTASSIUM CHLORIDE CRYS ER 20 MEQ PO TBCR
40.0000 meq | EXTENDED_RELEASE_TABLET | Freq: Once | ORAL | Status: AC
  Administered 2024-05-24: 40 meq via ORAL
  Filled 2024-05-24: qty 2

## 2024-05-24 NOTE — Plan of Care (Signed)

## 2024-05-24 NOTE — Progress Notes (Signed)
 "                        PROGRESS NOTE        PATIENT DETAILS Name: [name] Age: 79 y.o. Sex: female Date of Birth: 05/01/1946 Admit Date: 04/13/2024 Admitting Physician Tamela Stakes, MD ERE:Bzmryzrx, Rojelio PARAS, NP  Brief Summary: Patient is a 79 y.o.  female with history of CVA, COPD, HTN-who was brought to the ED after she was found by her roommate unresponsive (no heat/light-under a single blanket)-she was apparently covered in feces/dried blood-found to be hypothermic/hypotensive/hypoglycemic-she was thought to have undifferentiated shock-acute liver failure-acute kidney injury-admitted to the ICU-stabilized with pressors/D10 infusion/broad-spectrum antibiotics-rapidly improved and subsequently transferred to [facility].  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  Unfortunately-in spite of being on IV antibiotics-other supportive measures-an extended hospitalization-patient did not have any significant clinical improvement-oral intake continues to be erratic and mostly poor-palliative care was involved-after extensive discussion with patient's son-was transition to comfort measures on 12/29.  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 12/8>> ANA: Positive 12/8>> anti-smooth muscle antibody: Negative  Significant microbiology data: 12/6>> COVID/influenza/RSV PCR: Negative 12/6>> blood culture: No growth 12/6>> acute hepatitis serology: Negative 12/6>> CMV DNA PCR: Negative 12/9>> blood culture: No growth  Procedures: Line insertion 05/04/2024  Consults: PCCM GI ID Hematology [name]) Palliative care  Subjective:  Patient in bed, appears comfortable, denies any headache, no fever, no chest pain or pressure, no shortness of breath , no abdominal pain. No new focal weakness.  Objective: Vitals: Blood pressure 135/65, pulse 64, temperature 97.9 F (36.6 C), temperature source Axillary, resp. rate 17, height 5' 10 (1.778 m), weight 79.4 kg, SpO2 97%.   Exam: Sleeping comfortably-not in any distress Easily arouses-pleasantly confused Left-sided hemiparesis.    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  Due to thrombocytopenia-TEE was not pursued-however it was felt that since patient would be treated for 6 weeks-TEE would not change management.  PICC line was placed on 12/27.  Given ANA positive status-nonbacterial thrombotic endocarditis is also possible.   Unfortunately-no significant clinical improvement-has been followed by palliative care services closely-after discussion with patient's son-all antibiotics was discontinued 12/29-with focus mostly on comfort.    After patient was transition to full comfort care she has remained clinically stable and in fact started showing some signs of improvement around 05/18/2024, afebrile, no signs of active infection.  She  remains DNR with plan being to continue gentle medical treatment and look for appropriate placement.  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   Treated with 2 doses of IVIG  on 12/12 and 12/13 without any significant improvement-subsequently given Nplate  on 12/14 and 12/19-thankfully platelet counts now improved  Normocytic anemia   Likely secondary to critical illness-no evidence of blood loss/GI bleeding Required 2 units of PRBC so far  SVT on 12/15 Required adenosine  Currently stable on beta-blocker TSH stable.    Acute metabolic encephalopathy-likely superimposed on underlying dementia. Hospital delirium Acute metabolic encephalopathy initially felt to be secondary to possible sepsis physiology/liver failure-however continued to have waxing/waning mentation-even after treatment of underlying sepsis physiology/improvement in liver function- at this point is secondary to hospital-acquired delirium-probably superimposed on some amount of undiagnosed dementia.  Evaluated by psychiatry-does not have capacity Started on Remeron  and Seroquel  Has now been transition to full comfort measures.  Hypokalemia Replaced  Hypothermia Likely secondary to environmental exposure (per prior notes- cool room with single blanket-no heating or light) TSH stable Now normothermic after supportive care  Acute liver failure Unclear etiology-possibly shock liver from hypotension from possible sepsis  LFTs/coags improving Dopplers without portal/hepatic vein thrombosis Acute hepatitis serology negative CMV IgM positive but viral load negative  Negative workup for autoimmune hepatitis completed N-acetylcysteine  12/10 Empirically on lactulose  Gastroenterology input appreciated  Oropharyngeal dysphagia Secondary to critical illness/encephalopathy SLP following-on dysphagia 2 diet.  AKI Hemodynamically mediated due to  hypotension Resolved.  Hypokalemia/Hypophosphatemia Repleted  Metabolic acidosis Likely due to delayed clearance of lactic acid-in the setting of liver failure. Improved with supportive care  Self-limited episode-small-volume coffee-ground emesis Hb stable PPI By GI, resolved, no procedures.  Chest pain Likely atypical Resolved  Minimally elevated troponins Trend is flat Likely demand ischemia in the setting of hypotension/critical illness. Echo with stable EF  HTN BP stable, add  Norvasc  and monitor.  History of chronic HFpEF Status stable  History of CVA Baseline left hemiparesis Antiplatelets/statin held-in the setting of elevated liver enzymes and thrombocytopenia.  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  It is not felt that she has severe failure to thrive syndrome-extremely poor oral intake that is the primary cause of her hypoglycemia.  Unfortunately-in spite of adding Remeron -she has not significantly improved.  See above discussion regarding goals of care-now full comfort measures.  Pressure Ulcer: Agree with assessment and plan as outlined below Wound 04/14/24 0100 Pressure Injury Hip Left Stage 2 -  Partial thickness loss of dermis presenting as a shallow open injury with a red, pink wound bed without slough. (Active)     Wound 04/14/24 0100 Pressure Injury Buttocks Left;Right Unstageable - Full thickness tissue loss in which the base of the injury is covered by slough (yellow, tan, gray, green or  brown) and/or eschar (tan, brown or black) in the wound bed. (Active)     Wound 04/14/24 0100 Pressure Injury Sacrum Medial Stage 2 -  Partial thickness loss of dermis presenting as a shallow open injury with a red, pink wound bed without slough. (Active)    Code status:   Code Status: Limited: Do not attempt resuscitation (DNR) -DNR-LIMITED -Do Not Intubate/DNI    DVT Prophylaxis: SCDs Start: 04/13/24 2344    Family Communication: None at bedside.  Disposition Plan: Status is: Inpatient  Diet: Diet Order             DIET DYS 3 Room service appropriate? Yes; Fluid consistency: Thin  Diet effective now                     Data Review:   Patient Lines/Drains/Airways Status     Active Line/Drains/Airways     Name Placement date Placement time Site Days   PICC Single Lumen 05/04/24 Right Basilic 36 cm 0 cm 05/04/24  8676  Basilic  20   External Urinary Catheter 04/29/24  0553  --  25   Wound 04/14/24 0100 Pressure Injury Hip Left Stage 2 -  Partial thickness loss of dermis presenting as a shallow open injury with a red, pink wound bed without slough. 04/14/24  0100  Hip  40   Wound 04/14/24 0100 Pressure Injury Buttocks Left;Right Unstageable - Full thickness tissue loss in which the base of the injury is covered by slough (yellow, tan, gray, green or brown) and/or eschar (tan, brown or black) in the wound bed. 04/14/24  0100  Buttocks  40   Wound 04/14/24 0100 Pressure Injury Sacrum Medial Stage 2 -  Partial thickness loss of dermis presenting as a shallow open injury with a red, pink wound bed without slough. 04/14/24  0100  Sacrum  40             Inpatient Medications  Scheduled Meds:  amLODipine   10 mg Oral Daily   Chlorhexidine  Gluconate Cloth  6 each Topical Daily   feeding supplement  237 mL Oral TID BM   Gerhardt's butt cream   Topical BID   lactulose   20 g Oral BID   mirtazapine   30 mg Oral QHS   mouth rinse  15 mL Mouth Rinse 4 times per day    pantoprazole   40 mg Oral BID   QUEtiapine   25 mg Oral BID   sodium chloride  flush  10-40 mL Intracatheter Q12H   Continuous Infusions:   PRN Meds:.bisacodyl , diphenhydrAMINE , docusate, HYDROcodone -acetaminophen , ipratropium-albuterol , LORazepam , melatonin, ondansetron  (ZOFRAN ) IV, mouth rinse, polyethylene glycol, QUEtiapine , sodium chloride  flush  DVT Prophylaxis  SCDs Start: 04/13/24 2344   Recent Labs  Lab 05/19/24 0331 05/24/24 0232  WBC 4.0 4.6  HGB 9.1* 9.4*  HCT 28.1* 29.0*  PLT 94* 66*  MCV 99.3 100.7*  MCH 32.2 32.6  MCHC 32.4 32.4  RDW 18.2* 17.7*  LYMPHSABS  --  1.8  MONOABS  --  0.7  EOSABS  --  0.2  BASOSABS  --  0.0    Recent Labs  Lab 05/19/24 0331 05/24/24 0232  NA 140 141  K 3.6 3.4*  CL 105 108  CO2 28 28  ANIONGAP 7 6  GLUCOSE 78 80  BUN 17 21  CREATININE 0.47 0.49  AST  --  36  ALT  --  15  ALKPHOS  --  73  BILITOT  --  0.4  ALBUMIN   --  2.8*  INR  --  1.2  MG  --  2.0  CALCIUM  8.3* 8.4*       Recent Labs  Lab 05/19/24 0331 05/24/24 0232  INR  --  1.2  MG  --  2.0  CALCIUM  8.3* 8.4*     --------------------------------------------------------------------------------------------------------------- Lab Results  Component Value Date   CHOL 288 (H) 05/30/2023   HDL 78 05/30/2023   LDLCALC 165 (H) 05/30/2023   TRIG 248 (H) 05/30/2023   CHOLHDL 3.7 05/30/2023  Lab Results  Component Value Date   HGBA1C 5.3 09/11/2020   No results for input(s): TSH, T4TOTAL, FREET4, T3FREE, THYROIDAB in the last 72 hours.  No results for input(s): VITAMINB12, FOLATE, FERRITIN, TIBC, IRON, RETICCTPCT in the last 72 hours. ------------------------------------------------------------------------------------------------------------------ Cardiac Enzymes No results for input(s): CKMB, TROPONINI, MYOGLOBIN in the last 168 hours.  Invalid input(s): CK  Micro Results No results found for this or any previous  visit (from the past 240 hours).   Radiology Reports  No results found.    Signature  -   Lavada Stank M.D on 05/24/2024 at 9:24 AM   -  To page go to [email or web address]  "

## 2024-05-25 DIAGNOSIS — K7200 Acute and subacute hepatic failure without coma: Secondary | ICD-10-CM | POA: Diagnosis not present

## 2024-05-25 MED ORDER — LORAZEPAM 2 MG/ML IJ SOLN: 0.5000 mg | Freq: Four times a day (QID) | INTRAMUSCULAR | Status: AC | PRN

## 2024-05-25 NOTE — Progress Notes (Signed)
 "                        PROGRESS NOTE        PATIENT DETAILS Name: [name] Age: 79 y.o. Sex: female Date of Birth: 09-01-1945 Admit Date: 04/13/2024 Admitting Physician Tamela Stakes, MD ERE:Bzmryzrx, Rojelio PARAS, NP  Brief Summary: Patient is a 79 y.o.  female with history of CVA, COPD, HTN-who was brought to the ED after she was found by her roommate unresponsive (no heat/light-under a single blanket)-she was apparently covered in feces/dried blood-found to be hypothermic/hypotensive/hypoglycemic-she was thought to have undifferentiated shock-acute liver failure-acute kidney injury-admitted to the ICU-stabilized with pressors/D10 infusion/broad-spectrum antibiotics-rapidly improved and subsequently transferred to [facility].  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  Unfortunately-in spite of being on IV antibiotics-other supportive measures-an extended hospitalization-patient did not have any significant clinical improvement-oral intake continues to be erratic and mostly poor-palliative care was involved-after extensive discussion with patient's son-was transition to comfort measures on 12/29.  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 12/8>> ANA: Positive 12/8>> anti-smooth muscle antibody: Negative  Significant microbiology data: 12/6>> COVID/influenza/RSV PCR: Negative 12/6>> blood culture: No growth 12/6>> acute hepatitis serology: Negative 12/6>> CMV DNA PCR: Negative 12/9>> blood culture: No growth  Procedures: Line insertion 05/04/2024  Consults: PCCM GI ID Hematology [name]) Palliative care  Subjective: Patient in bed sleeping, arousable, denies any headache chest or abdominal pain.  Good appetite.  Objective: Vitals: Blood pressure (!) 129/57, pulse (!) 54, temperature 98.3 F (36.8 C), temperature source Axillary, resp. rate 18, height 5' 10 (1.778 m), weight 79.4 kg, SpO2 95%.   Exam: Sleeping comfortably-not in any distress Easily arouses-pleasantly confused Left-sided hemiparesis.    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  Due to thrombocytopenia-TEE was not pursued-however it was felt that since patient would be treated for 6 weeks-TEE would not change management.  PICC line was placed on 12/27.  Given ANA positive status-nonbacterial thrombotic endocarditis is also possible.   Unfortunately-no significant clinical improvement-has been followed by palliative care services closely-after discussion with patient's son-all antibiotics was discontinued 12/29-with focus mostly on comfort.    After patient was transition to full comfort care she has remained clinically stable and in fact started showing some signs of improvement around 05/18/2024, afebrile, no signs of active infection.  She remains DNR with plan being to continue gentle medical  treatment and look for appropriate placement.  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   Treated with 2 doses of IVIG on 12/12 and 12/13 without any significant improvement-subsequently given Nplate  on  12/14 and 12/19-thankfully platelet counts now improved  Normocytic anemia   Likely secondary to critical illness-no evidence of blood loss/GI bleeding Required 2 units of PRBC so far  SVT on 12/15 Required adenosine  Currently stable on beta-blocker TSH stable.    Acute metabolic encephalopathy-likely superimposed on underlying dementia. Hospital delirium Acute metabolic encephalopathy initially felt to be secondary to possible sepsis physiology/liver failure-however continued to have waxing/waning mentation-even after treatment of underlying sepsis physiology/improvement in liver function- at this point is secondary to hospital-acquired delirium-probably superimposed on some amount of undiagnosed dementia.  Evaluated by psychiatry-does not have capacity Started on Remeron  and Seroquel  Has now been transition to full comfort measures.  Hypokalemia Replaced  Hypothermia Likely secondary to environmental exposure (per prior notes- cool room with single blanket-no heating or light) TSH stable Now normothermic after supportive care  Acute liver failure Unclear etiology-possibly shock liver from hypotension from possible sepsis  LFTs/coags improving Dopplers without portal/hepatic vein thrombosis Acute hepatitis serology negative CMV IgM positive but viral load negative  Negative workup for autoimmune hepatitis completed N-acetylcysteine  12/10 Empirically on lactulose  Gastroenterology input appreciated  Oropharyngeal dysphagia Secondary to critical illness/encephalopathy SLP following-on dysphagia 2 diet.  AKI Hemodynamically mediated due to  hypotension Resolved.  Hypokalemia/Hypophosphatemia Repleted  Metabolic acidosis Likely due to delayed clearance of lactic acid-in the setting of liver failure. Improved with supportive care  Self-limited episode-small-volume coffee-ground emesis Hb stable PPI By GI, resolved, no procedures.  Chest pain Likely atypical Resolved  Minimally elevated troponins Trend is flat Likely demand ischemia in the setting of hypotension/critical illness. Echo with stable EF  HTN BP stable, add  Norvasc  and monitor.  History of chronic HFpEF Status stable  History of CVA Baseline left hemiparesis Antiplatelets/statin held-in the setting of elevated liver enzymes and thrombocytopenia.  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  It is not felt that she has severe failure to thrive syndrome-extremely poor oral intake that is the primary cause of her hypoglycemia.  Unfortunately-in spite of adding Remeron -she has not significantly improved.  See above discussion regarding goals of care-now full comfort measures.  Pressure Ulcer: Agree with assessment and plan as outlined below Wound 04/14/24 0100 Pressure Injury Hip Left Stage 2 -  Partial thickness loss of dermis presenting as a shallow open injury with a red, pink wound bed without slough. (Active)     Wound 04/14/24 0100 Pressure Injury Buttocks Left;Right Unstageable - Full thickness tissue loss in which the base of the injury is covered by slough (yellow, tan, gray, green or  brown) and/or eschar (tan, brown or black) in the wound bed. (Active)     Wound 04/14/24 0100 Pressure Injury Sacrum Medial Stage 2 -  Partial thickness loss of dermis presenting as a shallow open injury with a red, pink wound bed without slough. (Active)    Code status:   Code Status: Limited: Do not attempt resuscitation (DNR) -DNR-LIMITED -Do Not Intubate/DNI    DVT Prophylaxis: SCDs Start: 04/13/24 2344    Family Communication: None at bedside.  Disposition Plan: Status is: Inpatient     Diet: Diet Order  DIET DYS 3 Room service appropriate? Yes; Fluid consistency: Thin  Diet effective now                     Data Review:   Patient Lines/Drains/Airways Status     Active Line/Drains/Airways     Name Placement date Placement time Site Days   PICC Single Lumen 05/04/24 Right Basilic 36 cm 0 cm 05/04/24  8676  Basilic  21   External Urinary Catheter 04/29/24  0553  --  26   Wound 04/14/24 0100 Pressure Injury Hip Left Stage 2 -  Partial thickness loss of dermis presenting as a shallow open injury with a red, pink wound bed without slough. 04/14/24  0100  Hip  41   Wound 04/14/24 0100 Pressure Injury Buttocks Left;Right Unstageable - Full thickness tissue loss in which the base of the injury is covered by slough (yellow, tan, gray, green or brown) and/or eschar (tan, brown or black) in the wound bed. 04/14/24  0100  Buttocks  41   Wound 04/14/24 0100 Pressure Injury Sacrum Medial Stage 2 -  Partial thickness loss of dermis presenting as a shallow open injury with a red, pink wound bed without slough. 04/14/24  0100  Sacrum  41             Inpatient Medications  Scheduled Meds:  amLODipine   10 mg Oral Daily   Chlorhexidine  Gluconate Cloth  6 each Topical Daily   feeding supplement  237 mL Oral TID BM   Gerhardt's butt cream   Topical BID   lactulose   20 g Oral BID   mirtazapine   30 mg Oral QHS   mouth rinse  15 mL Mouth Rinse 4 times per day    pantoprazole   40 mg Oral BID   QUEtiapine   25 mg Oral BID   sodium chloride  flush  10-40 mL Intracatheter Q12H   Continuous Infusions:   PRN Meds:.bisacodyl , docusate, HYDROcodone -acetaminophen , ipratropium-albuterol , LORazepam , melatonin, ondansetron  (ZOFRAN ) IV, mouth rinse, polyethylene glycol, QUEtiapine , sodium chloride  flush  DVT Prophylaxis  SCDs Start: 04/13/24 2344   Recent Labs  Lab 05/19/24 0331 05/24/24 0232  WBC 4.0 4.6  HGB 9.1* 9.4*  HCT 28.1* 29.0*  PLT 94* 66*  MCV 99.3 100.7*  MCH 32.2 32.6  MCHC 32.4 32.4  RDW 18.2* 17.7*  LYMPHSABS  --  1.8  MONOABS  --  0.7  EOSABS  --  0.2  BASOSABS  --  0.0    Recent Labs  Lab 05/19/24 0331 05/24/24 0232  NA 140 141  K 3.6 3.4*  CL 105 108  CO2 28 28  ANIONGAP 7 6  GLUCOSE 78 80  BUN 17 21  CREATININE 0.47 0.49  AST  --  36  ALT  --  15  ALKPHOS  --  73  BILITOT  --  0.4  ALBUMIN   --  2.8*  INR  --  1.2  MG  --  2.0  CALCIUM  8.3* 8.4*       Recent Labs  Lab 05/19/24 0331 05/24/24 0232  INR  --  1.2  MG  --  2.0  CALCIUM  8.3* 8.4*     --------------------------------------------------------------------------------------------------------------- Lab Results  Component Value Date   CHOL 288 (H) 05/30/2023   HDL 78 05/30/2023   LDLCALC 165 (H) 05/30/2023   TRIG 248 (H) 05/30/2023   CHOLHDL 3.7 05/30/2023    Lab Results  Component Value Date   HGBA1C 5.3 09/11/2020   No results for  input(s): TSH, T4TOTAL, FREET4, T3FREE, THYROIDAB in the last 72 hours.  No results for input(s): VITAMINB12, FOLATE, FERRITIN, TIBC, IRON, RETICCTPCT in the last 72 hours. ------------------------------------------------------------------------------------------------------------------ Cardiac Enzymes No results for input(s): CKMB, TROPONINI, MYOGLOBIN in the last 168 hours.  Invalid input(s): CK  Micro Results No results found for this or any previous visit (from the  past 240 hours).   Radiology Reports  No results found.    Signature  -   Lavada Stank M.D on 05/25/2024 at 9:02 AM   -  To page go to [email or web address]  "

## 2024-05-26 DIAGNOSIS — K7200 Acute and subacute hepatic failure without coma: Secondary | ICD-10-CM | POA: Diagnosis not present

## 2024-05-26 NOTE — Plan of Care (Signed)
°  Patient is progressing towards goals of care    Problem: Education: Goal: Knowledge of General Education information will improve Description: Including pain rating scale, medication(s)/side effects and non-pharmacologic comfort measures Outcome: Progressing   Problem: Health Behavior/Discharge Planning: Goal: Ability to manage health-related needs will improve Outcome: Progressing   Problem: Clinical Measurements: Goal: Ability to maintain clinical measurements within normal limits will improve Outcome: Progressing Goal: Will remain free from infection Outcome: Progressing Goal: Diagnostic test results will improve Outcome: Progressing Goal: Respiratory complications will improve Outcome: Progressing Goal: Cardiovascular complication will be avoided Outcome: Progressing   Problem: Activity: Goal: Risk for activity intolerance will decrease Outcome: Progressing   Problem: Nutrition: Goal: Adequate nutrition will be maintained Outcome: Progressing   Problem: Coping: Goal: Level of anxiety will decrease Outcome: Progressing   Problem: Elimination: Goal: Will not experience complications related to bowel motility Outcome: Progressing Goal: Will not experience complications related to urinary retention Outcome: Progressing   Problem: Pain Managment: Goal: General experience of comfort will improve and/or be controlled Outcome: Progressing   Problem: Safety: Goal: Ability to remain free from injury will improve Outcome: Progressing   Problem: Skin Integrity: Goal: Risk for impaired skin integrity will decrease Outcome: Progressing

## 2024-05-26 NOTE — Plan of Care (Signed)

## 2024-05-26 NOTE — Progress Notes (Signed)
 "                        PROGRESS NOTE        PATIENT DETAILS Name: [name] Age: 79 y.o. Sex: female Date of Birth: September 05, 1945 Admit Date: 04/13/2024 Admitting Physician Tamela Stakes, MD ERE:Bzmryzrx, Rojelio PARAS, NP  Brief Summary: Patient is a 79 y.o.  female with history of CVA, COPD, HTN-who was brought to the ED after she was found by her roommate unresponsive (no heat/light-under a single blanket)-she was apparently covered in feces/dried blood-found to be hypothermic/hypotensive/hypoglycemic-she was thought to have undifferentiated shock-acute liver failure-acute kidney injury-admitted to the ICU-stabilized with pressors/D10 infusion/broad-spectrum antibiotics-rapidly improved and subsequently transferred to [facility].  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  Unfortunately-in spite of being on IV antibiotics-other supportive measures-an extended hospitalization-patient did not have any significant clinical improvement-oral intake continues to be erratic and mostly poor-palliative care was involved-after extensive discussion with patient's son-was transition to comfort measures on 12/29.  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 12/8>> ANA: Positive 12/8>> anti-smooth muscle antibody: Negative  Significant microbiology data: 12/6>> COVID/influenza/RSV PCR: Negative 12/6>> blood culture: No growth 12/6>> acute hepatitis serology: Negative 12/6>> CMV DNA PCR: Negative 12/9>> blood culture: No growth  Procedures: Line insertion 05/04/2024  Consults: PCCM GI ID Hematology [name]) Palliative care  Subjective:  Patient in bed, appears comfortable, denies any headache, no fever, no chest pain or pressure, no shortness of breath , no abdominal pain. No new focal weakness.  Objective: Vitals: Blood pressure 114/66, pulse (!) 54, temperature 98.6 F (37 C), temperature source Axillary, resp. rate 18, height 5' 10 (1.778 m), weight 79.4 kg, SpO2 93%.   Exam: Sleeping comfortably-not in any distress Easily arouses-pleasantly confused Left-sided hemiparesis.    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  Due to thrombocytopenia-TEE was not pursued-however it was felt that since patient would be treated for 6 weeks-TEE would not change management.  PICC line was placed on 12/27.  Given ANA positive status-nonbacterial thrombotic endocarditis is also possible.   Unfortunately-no significant clinical improvement-has been followed by palliative care services closely-after discussion with patient's son-all antibiotics was discontinued 12/29-with focus mostly on comfort.    After patient was transition to full comfort care she has remained clinically stable and in fact started showing some signs of improvement around 05/18/2024, afebrile, no signs of active infection.  She  remains DNR with plan being to continue gentle medical treatment and look for appropriate placement.  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   Treated with 2 doses of  IVIG on 12/12 and 12/13 without any significant improvement-subsequently given Nplate  on 12/14 and 12/19-thankfully platelet counts now improved  Normocytic anemia   Likely secondary to critical illness-no evidence of blood loss/GI bleeding Required 2 units of PRBC so far  SVT on 12/15 Required adenosine  Currently stable on beta-blocker TSH stable.    Acute metabolic encephalopathy-likely superimposed on underlying dementia. Hospital delirium Acute metabolic encephalopathy initially felt to be secondary to possible sepsis physiology/liver failure-however continued to have waxing/waning mentation-even after treatment of underlying sepsis physiology/improvement in liver function- at this point is secondary to hospital-acquired delirium-probably superimposed on some amount of undiagnosed dementia.  Evaluated by psychiatry-does not have capacity Started on Remeron  and Seroquel  Has now been transition to full comfort measures.  Hypokalemia Replaced  Hypothermia Likely secondary to environmental exposure (per prior notes- cool room with single blanket-no heating or light) TSH stable Now normothermic after supportive care  Acute liver failure Unclear etiology-possibly shock liver from hypotension from possible sepsis  LFTs/coags improving Dopplers without portal/hepatic vein thrombosis Acute hepatitis serology negative CMV IgM positive but viral load negative  Negative workup for autoimmune hepatitis completed N-acetylcysteine  12/10 Empirically on lactulose  Gastroenterology input appreciated  Oropharyngeal dysphagia Secondary to critical illness/encephalopathy SLP following-on dysphagia 2 diet.  AKI Hemodynamically mediated due to  hypotension Resolved.  Hypokalemia/Hypophosphatemia Repleted  Metabolic acidosis Likely due to delayed clearance of lactic acid-in the setting of liver failure. Improved with supportive care  Self-limited episode-small-volume coffee-ground emesis Hb stable PPI By GI, resolved, no procedures.  Chest pain Likely atypical Resolved  Minimally elevated troponins Trend is flat Likely demand ischemia in the setting of hypotension/critical illness. Echo with stable EF  HTN BP stable, add  Norvasc  and monitor.  History of chronic HFpEF Status stable  History of CVA Baseline left hemiparesis Antiplatelets/statin held-in the setting of elevated liver enzymes and thrombocytopenia.  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  It is not felt that she has severe failure to thrive syndrome-extremely poor oral intake that is the primary cause of her hypoglycemia.  Unfortunately-in spite of adding Remeron -she has not significantly improved.  See above discussion regarding goals of care-now full comfort measures.  Pressure Ulcer: Agree with assessment and plan as outlined below Wound 04/14/24 0100 Pressure Injury Hip Left Stage 2 -  Partial thickness loss of dermis presenting as a shallow open injury with a red, pink wound bed without slough. (Active)     Wound 04/14/24 0100 Pressure Injury Buttocks Left;Right Unstageable - Full thickness tissue loss in which the base of the injury is covered by slough (yellow, tan, gray, green or  brown) and/or eschar (tan, brown or black) in the wound bed. (Active)     Wound 04/14/24 0100 Pressure Injury Sacrum Medial Stage 2 -  Partial thickness loss of dermis presenting as a shallow open injury with a red, pink wound bed without slough. (Active)    Code status:   Code Status: Limited: Do not attempt resuscitation (DNR) -DNR-LIMITED -Do Not Intubate/DNI    DVT Prophylaxis: SCDs Start: 04/13/24 2344    Family Communication: None at bedside.  Disposition Plan: Status is: Inpatient  Diet: Diet Order             DIET DYS 3 Room service appropriate? Yes; Fluid consistency: Thin  Diet effective now                     Data Review:   Patient Lines/Drains/Airways Status     Active Line/Drains/Airways     Name Placement date Placement time Site Days   PICC Single Lumen 05/04/24 Right Basilic 36 cm 0 cm 05/04/24  8676  Basilic  22   External Urinary Catheter 04/29/24  0553  --  27   Wound 04/14/24 0100 Pressure Injury Hip Left Stage 2 -  Partial thickness loss of dermis presenting as a shallow open injury with a red, pink wound bed without slough. 04/14/24  0100  Hip  42   Wound 04/14/24 0100 Pressure Injury Buttocks Left;Right Unstageable - Full thickness tissue loss in which the base of the injury is covered by slough (yellow, tan, gray, green or brown) and/or eschar (tan, brown or black) in the wound bed. 04/14/24  0100  Buttocks  42   Wound 04/14/24 0100 Pressure Injury Sacrum Medial Stage 2 -  Partial thickness loss of dermis presenting as a shallow open injury with a red, pink wound bed without slough. 04/14/24  0100  Sacrum  42             Inpatient Medications  Scheduled Meds:  amLODipine   10 mg Oral Daily   Chlorhexidine  Gluconate Cloth  6 each Topical Daily   feeding supplement  237 mL Oral TID BM   Gerhardt's butt cream   Topical BID   lactulose   20 g Oral BID   mirtazapine   30 mg Oral QHS   mouth rinse  15 mL Mouth Rinse 4 times per day    pantoprazole   40 mg Oral BID   QUEtiapine   25 mg Oral BID   sodium chloride  flush  10-40 mL Intracatheter Q12H   Continuous Infusions:   PRN Meds:.bisacodyl , docusate, HYDROcodone -acetaminophen , ipratropium-albuterol , LORazepam , melatonin, ondansetron  (ZOFRAN ) IV, mouth rinse, polyethylene glycol, QUEtiapine , sodium chloride  flush  DVT Prophylaxis  SCDs Start: 04/13/24 2344   Recent Labs  Lab 05/24/24 0232  WBC 4.6  HGB 9.4*  HCT 29.0*  PLT 66*  MCV 100.7*  MCH 32.6  MCHC 32.4  RDW 17.7*  LYMPHSABS 1.8  MONOABS 0.7  EOSABS 0.2  BASOSABS 0.0    Recent Labs  Lab 05/24/24 0232  NA 141  K 3.4*  CL 108  CO2 28  ANIONGAP 6  GLUCOSE 80  BUN 21  CREATININE 0.49  AST 36  ALT 15  ALKPHOS 73  BILITOT 0.4  ALBUMIN  2.8*  INR 1.2  MG 2.0  CALCIUM  8.4*       Recent Labs  Lab 05/24/24 0232  INR 1.2  MG 2.0  CALCIUM  8.4*     --------------------------------------------------------------------------------------------------------------- Lab Results  Component Value Date   CHOL 288 (H) 05/30/2023   HDL 78 05/30/2023   LDLCALC 165 (H) 05/30/2023   TRIG 248 (H) 05/30/2023   CHOLHDL 3.7 05/30/2023    Lab Results  Component Value Date   HGBA1C 5.3 09/11/2020   No results for input(s): TSH, T4TOTAL, FREET4, T3FREE, THYROIDAB in the last 72 hours.  No results for input(s): VITAMINB12, FOLATE, FERRITIN, TIBC, IRON, RETICCTPCT in the last 72 hours. ------------------------------------------------------------------------------------------------------------------ Cardiac Enzymes No results for input(s): CKMB, TROPONINI, MYOGLOBIN in the last 168 hours.  Invalid input(s): CK  Micro  Results No results found for this or any previous visit (from the past 240 hours).   Radiology Reports  No results found.    Signature  -   Lavada Stank M.D on 05/26/2024 at 8:22 AM   -  To page go to [email or web address]  "

## 2024-05-27 DIAGNOSIS — K7200 Acute and subacute hepatic failure without coma: Secondary | ICD-10-CM | POA: Diagnosis not present

## 2024-05-27 LAB — CBC WITH DIFFERENTIAL/PLATELET
Abs Immature Granulocytes: 0.01 K/uL (ref 0.00–0.07)
Basophils Absolute: 0 K/uL (ref 0.0–0.1)
Basophils Relative: 1 %
Eosinophils Absolute: 0.3 K/uL (ref 0.0–0.5)
Eosinophils Relative: 7 %
HCT: 27.6 % — ABNORMAL LOW (ref 36.0–46.0)
Hemoglobin: 8.9 g/dL — ABNORMAL LOW (ref 12.0–15.0)
Immature Granulocytes: 0 %
Lymphocytes Relative: 38 %
Lymphs Abs: 1.5 K/uL (ref 0.7–4.0)
MCH: 32.5 pg (ref 26.0–34.0)
MCHC: 32.2 g/dL (ref 30.0–36.0)
MCV: 100.7 fL — ABNORMAL HIGH (ref 80.0–100.0)
Monocytes Absolute: 0.6 K/uL (ref 0.1–1.0)
Monocytes Relative: 14 %
Neutro Abs: 1.6 K/uL — ABNORMAL LOW (ref 1.7–7.7)
Neutrophils Relative %: 40 %
Platelets: 62 K/uL — ABNORMAL LOW (ref 150–400)
RBC: 2.74 MIL/uL — ABNORMAL LOW (ref 3.87–5.11)
RDW: 17 % — ABNORMAL HIGH (ref 11.5–15.5)
WBC: 4.1 K/uL (ref 4.0–10.5)
nRBC: 0 % (ref 0.0–0.2)

## 2024-05-27 LAB — BASIC METABOLIC PANEL WITH GFR
Anion gap: 7 (ref 5–15)
BUN: 20 mg/dL (ref 8–23)
CO2: 28 mmol/L (ref 22–32)
Calcium: 8.4 mg/dL — ABNORMAL LOW (ref 8.9–10.3)
Chloride: 104 mmol/L (ref 98–111)
Creatinine, Ser: 0.67 mg/dL (ref 0.44–1.00)
GFR, Estimated: >60 mL/min
Glucose, Bld: 105 mg/dL — ABNORMAL HIGH (ref 70–99)
Potassium: 3.6 mmol/L (ref 3.5–5.1)
Sodium: 139 mmol/L (ref 135–145)

## 2024-05-27 LAB — TSH: TSH: 3.5 u[IU]/mL (ref 0.350–4.500)

## 2024-05-27 LAB — MAGNESIUM: Magnesium: 1.7 mg/dL (ref 1.7–2.4)

## 2024-05-27 LAB — T4, FREE: Free T4: 0.97 ng/dL (ref 0.80–2.00)

## 2024-05-27 LAB — PHOSPHORUS: Phosphorus: 3.1 mg/dL (ref 2.5–4.6)

## 2024-05-27 NOTE — Plan of Care (Signed)

## 2024-05-27 NOTE — Progress Notes (Signed)
 "                        PROGRESS NOTE        PATIENT DETAILS Name: [name] Age: 79 y.o. Sex: female Date of Birth: [birth year] Admit Date: 04/13/2024 Admitting Physician Tamela Stakes, MD ERE:Bzmryzrx, Rojelio PARAS, NP  Brief Summary: Patient is a 79 y.o.  female with history of CVA, COPD, HTN-who was brought to the ED after she was found by her roommate unresponsive (no heat/light-under a single blanket)-she was apparently covered in feces/dried blood-found to be hypothermic/hypotensive/hypoglycemic-she was thought to have undifferentiated shock-acute liver failure-acute kidney injury-admitted to the ICU-stabilized with pressors/D10 infusion/broad-spectrum antibiotics-rapidly improved and subsequently transferred to [facility].  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  Unfortunately-in spite of being on IV antibiotics-other supportive measures-an extended hospitalization-patient did not have any significant clinical improvement-oral intake continues to be erratic and mostly poor-palliative care was involved-after extensive discussion with patient's son-was transition to comfort measures on 12/29.  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 12/8>> ANA: Positive 12/8>> anti-smooth muscle antibody: Negative  Significant microbiology data: 12/6>> COVID/influenza/RSV PCR: Negative 12/6>> blood culture: No growth 12/6>> acute hepatitis serology: Negative 12/6>> CMV DNA PCR: Negative 12/9>> blood culture: No growth  Procedures: Line insertion 05/04/2024  Consults: PCCM GI ID Hematology [name]) Palliative care  Subjective:  Patient in bed, appears comfortable, denies any headache, no fever, no chest pain or pressure, no shortness of breath , no abdominal pain. No new focal weakness.   Objective: Vitals: Blood pressure (!) 144/58, pulse (!) 57, temperature 97.7 F (36.5 C), temperature source Axillary, resp. rate 18, height 5' 10 (1.778 m), weight 79.4 kg, SpO2 96%.   Exam: Sleeping comfortably-not in any distress Easily arouses-pleasantly confused Left-sided hemiparesis.    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  Due to thrombocytopenia-TEE was not pursued-however it was felt that since patient would be treated for 6 weeks-TEE would not change management.  PICC line was placed on 12/27.  Given ANA positive status-nonbacterial thrombotic endocarditis is also possible.   Unfortunately-no significant clinical improvement-has been followed by palliative care services closely-after discussion with patient's son-all antibiotics was discontinued 12/29-with focus mostly on comfort.    After patient was transition to full comfort care she has remained clinically stable and in fact started showing some signs of improvement around 05/18/2024, afebrile, no signs of active infection.   She remains DNR with plan being to continue gentle medical treatment and look for appropriate placement.  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   Treated with 2  doses of IVIG on 12/12 and 12/13 without any significant improvement-subsequently given Nplate  on 12/14 and 12/19-thankfully platelet counts now improved  Normocytic anemia   Likely secondary to critical illness-no evidence of blood loss/GI bleeding Required 2 units of PRBC so far  SVT on 12/15 now resting bradycardia with stable blood pressures Required adenosine  Currently stable off of medications, now is resting sinus bradycardia with stable blood pressures, heart rate in mid 50s, TSH free T4 stable.  Acute metabolic encephalopathy-likely superimposed on underlying dementia. Hospital delirium Acute metabolic encephalopathy initially felt to be secondary to possible sepsis physiology/liver failure-however continued to have waxing/waning mentation-even after treatment of underlying sepsis physiology/improvement in liver function- at this point is secondary to hospital-acquired delirium-probably superimposed on some amount of undiagnosed dementia.  Evaluated by psychiatry-does not have capacity Started on Remeron  and Seroquel  Has now been transition to full comfort measures.  Hypokalemia Replaced  Hypothermia Likely secondary to environmental exposure (per prior notes- cool room with single blanket-no heating or light) TSH stable Now normothermic after supportive care  Acute liver failure Unclear etiology-possibly shock liver from hypotension from possible sepsis  LFTs/coags improving Dopplers without portal/hepatic vein thrombosis Acute hepatitis serology negative CMV IgM positive but viral load negative  Negative workup for autoimmune hepatitis completed N-acetylcysteine  12/10 Empirically on lactulose  Gastroenterology input  appreciated  Oropharyngeal dysphagia Secondary to critical illness/encephalopathy SLP following-on dysphagia 2 diet.  AKI Hemodynamically mediated due to hypotension Resolved.  Hypokalemia/Hypophosphatemia Repleted  Metabolic acidosis Likely due to delayed clearance of lactic acid-in the setting of liver failure. Improved with supportive care  Self-limited episode-small-volume coffee-ground emesis Hb stable PPI By GI, resolved, no procedures.  Chest pain Likely atypical Resolved  Minimally elevated troponins Trend is flat Likely demand ischemia in the setting of hypotension/critical illness. Echo with stable EF  HTN BP stable, add  Norvasc  and monitor.  History of chronic HFpEF Status stable  History of CVA Baseline left hemiparesis Antiplatelets/statin held-in the setting of elevated liver enzymes and thrombocytopenia.  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  It is not felt that she has severe failure to thrive syndrome-extremely poor oral intake that is the primary cause of her hypoglycemia.  Unfortunately-in spite of adding Remeron -she has not significantly improved.  See above discussion regarding goals of care-now full comfort measures.  Pressure Ulcer: Agree with assessment and plan as outlined below Wound 04/14/24 0100 Pressure Injury Hip Left Stage 2 -  Partial thickness loss of dermis presenting as a shallow open injury with a red, pink wound bed without slough. (Active)     Wound 04/14/24  0100 Pressure Injury Buttocks Left;Right Unstageable - Full thickness tissue loss in which the base of the injury is covered by slough (yellow, tan, gray, green or brown) and/or eschar (tan, brown or black) in the wound bed. (Active)     Wound 04/14/24 0100 Pressure Injury Sacrum Medial Stage 2 -  Partial thickness loss of dermis presenting as a shallow open injury with a red, pink wound bed without slough. (Active)    Code status:   Code Status: Limited: Do not attempt resuscitation (DNR) -DNR-LIMITED -Do Not Intubate/DNI  DVT Prophylaxis: SCDs Start: 04/13/24 2344    Family Communication: None at bedside.  Disposition Plan: Status is: Inpatient     Diet: Diet Order             DIET DYS 3 Room service appropriate? Yes; Fluid consistency: Thin  Diet effective now                     Data Review:   Patient Lines/Drains/Airways Status     Active Line/Drains/Airways     Name Placement date Placement time Site Days   PICC Single Lumen 05/04/24 Right Basilic 36 cm 0 cm 05/04/24  8676  Basilic  23   External Urinary Catheter 04/29/24  0553  --  28   Wound 04/14/24 0100 Pressure Injury Hip Left Stage 2 -  Partial thickness loss of dermis presenting as a shallow open injury with a red, pink wound bed without slough. 04/14/24  0100  Hip  43   Wound 04/14/24 0100 Pressure Injury Buttocks Left;Right Unstageable - Full thickness tissue loss in which the base of the injury is covered by slough (yellow, tan, gray, green or brown) and/or eschar (tan, brown or black) in the wound bed. 04/14/24  0100  Buttocks  43   Wound 04/14/24 0100 Pressure Injury Sacrum Medial Stage 2 -  Partial thickness loss of dermis presenting as a shallow open injury with a red, pink wound bed without slough. 04/14/24  0100  Sacrum  43             Inpatient Medications  Scheduled Meds:  amLODipine   10 mg Oral Daily   Chlorhexidine  Gluconate Cloth  6 each Topical Daily   feeding supplement  237 mL  Oral TID BM   Gerhardt's butt cream   Topical BID   lactulose   20 g Oral BID   mirtazapine   30 mg Oral QHS   mouth rinse  15 mL Mouth Rinse 4 times per day   pantoprazole   40 mg Oral BID   QUEtiapine   25 mg Oral BID   sodium chloride  flush  10-40 mL Intracatheter Q12H   Continuous Infusions:   PRN Meds:.bisacodyl , docusate, HYDROcodone -acetaminophen , ipratropium-albuterol , LORazepam , melatonin, ondansetron  (ZOFRAN ) IV, mouth rinse, polyethylene glycol, QUEtiapine , sodium chloride  flush  DVT Prophylaxis  SCDs Start: 04/13/24 2344   Recent Labs  Lab 05/24/24 0232 05/27/24 0117  WBC 4.6 4.1  HGB 9.4* 8.9*  HCT 29.0* 27.6*  PLT 66* 62*  MCV 100.7* 100.7*  MCH 32.6 32.5  MCHC 32.4 32.2  RDW 17.7* 17.0*  LYMPHSABS 1.8 1.5  MONOABS 0.7 0.6  EOSABS 0.2 0.3  BASOSABS 0.0 0.0    Recent Labs  Lab 05/24/24 0232 05/27/24 0117  NA 141 139  K 3.4* 3.6  CL 108 104  CO2 28 28  ANIONGAP 6 7  GLUCOSE 80 105*  BUN 21 20  CREATININE 0.49 0.67  AST 36  --   ALT 15  --   ALKPHOS 73  --   BILITOT 0.4  --   ALBUMIN  2.8*  --   INR 1.2  --   TSH  --  3.500  MG 2.0 1.7  PHOS  --  3.1  CALCIUM  8.4* 8.4*       Recent Labs  Lab 05/24/24 0232 05/27/24 0117  INR 1.2  --   TSH  --  3.500  MG 2.0 1.7  CALCIUM  8.4* 8.4*     --------------------------------------------------------------------------------------------------------------- Lab Results  Component Value Date  CHOL 288 (H) 05/30/2023   HDL 78 05/30/2023   LDLCALC 165 (H) 05/30/2023   TRIG 248 (H) 05/30/2023   CHOLHDL 3.7 05/30/2023    Lab Results  Component Value Date   HGBA1C 5.3 09/11/2020   Recent Labs    05/27/24 0117  TSH 3.500  FREET4 0.97    No results for input(s): VITAMINB12, FOLATE, FERRITIN, TIBC, IRON, RETICCTPCT in the last 72 hours. ------------------------------------------------------------------------------------------------------------------ Cardiac Enzymes No  results for input(s): CKMB, TROPONINI, MYOGLOBIN in the last 168 hours.  Invalid input(s): CK  Micro Results No results found for this or any previous visit (from the past 240 hours).   Radiology Reports  No results found.    Signature  -   Lavada Stank M.D on 05/27/2024 at 7:49 AM   -  To page go to [email or web address]  "

## 2024-05-27 NOTE — TOC Progression Note (Signed)
 Transition of Care [facility]) - Progression Note    Patient Details  Name: [name] MRN: [id number] Date of Birth: 09-Jun-1945  Transition of [facility]) CM/SW Contact  [name], LCSW Phone Number: 05/27/2024, 10:15 AM  Clinical Narrative:    CSW continuing to follow for APS assistance with patient's discharge plan. Barriers remain in place.    Expected Discharge Plan: Skilled Nursing Facility Barriers to Discharge: English As A Second Language Teacher, Over assets for placement, APS involvement                Expected Discharge Plan and Services In-house Referral: Clinical Social Work   Post Acute Care Choice: Skilled Nursing Facility Living arrangements for the past 2 months: Single Family Home                                       Social Drivers of Health (SDOH) Interventions SDOH Screenings   Food Insecurity: Food Insecurity Present (04/15/2024)  Housing: High Risk (04/22/2024)  Transportation Needs: Unmet Transportation Needs (04/15/2024)  Utilities: At Risk (04/15/2024)  Depression (PHQ2-9): Low Risk (06/04/2021)  Social Connections: Unknown (04/15/2024)  Tobacco Use: Medium Risk (05/10/2024)    Readmission Risk Interventions    01/15/2024    2:24 PM  Readmission Risk Prevention Plan  Transportation Screening Complete  HRI or Home Care Consult Complete  Social Work Consult for Recovery Care Planning/Counseling Complete  Palliative Care Screening Not Applicable  Medication Review Oceanographer) Complete

## 2024-05-28 DIAGNOSIS — K7200 Acute and subacute hepatic failure without coma: Secondary | ICD-10-CM | POA: Diagnosis not present

## 2024-05-28 NOTE — Progress Notes (Signed)
 "                        PROGRESS NOTE        PATIENT DETAILS Name: [name] Age: 79 y.o. Sex: female Date of Birth: [birth year] Admit Date: 04/13/2024 Admitting Physician Tamela Stakes, MD ERE:Bzmryzrx, Rojelio PARAS, NP  Brief Summary: Patient is a 79 y.o.  female with history of CVA, COPD, HTN-who was brought to the ED after she was found by her roommate unresponsive (no heat/light-under a single blanket)-she was apparently covered in feces/dried blood-found to be hypothermic/hypotensive/hypoglycemic-she was thought to have undifferentiated shock-acute liver failure-acute kidney injury-admitted to the ICU-stabilized with pressors/D10 infusion/broad-spectrum antibiotics-rapidly improved and subsequently transferred to [facility].  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  Unfortunately-in spite of being on IV antibiotics-other supportive measures-an extended hospitalization-patient did not have any significant clinical improvement-oral intake continues to be erratic and mostly poor-palliative care was involved-after extensive discussion with patient's son-was transition to comfort measures on 12/29.  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 12/8>> ANA: Positive 12/8>> anti-smooth muscle antibody: Negative  Significant microbiology data: 12/6>> COVID/influenza/RSV PCR: Negative 12/6>> blood culture: No growth 12/6>> acute hepatitis serology: Negative 12/6>> CMV DNA PCR: Negative 12/9>> blood culture: No growth  Procedures: Line insertion 05/04/2024  Consults: PCCM GI ID Hematology [name]) Palliative care  Subjective:  Patient in bed, appears comfortable, denies any headache, no fever, no chest pain or pressure, no shortness of breath , no abdominal pain. No new focal weakness.   Objective: Vitals: Blood pressure (!) 146/55, pulse 65, temperature 98.9 F (37.2 C), temperature source Axillary, resp. rate 18, height 5' 10 (1.778 m), weight 79.4 kg, SpO2 96%.   Exam: Sleeping comfortably-not in any distress Easily arouses-pleasantly confused Left-sided hemiparesis.    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  Due to thrombocytopenia-TEE was not pursued-however it was felt that since patient would be treated for 6 weeks-TEE would not change management.  PICC line was placed on 12/27.  Given ANA positive status-nonbacterial thrombotic endocarditis is also possible.   Unfortunately-no significant clinical improvement-has been followed by palliative care services closely-after discussion with patient's son-all antibiotics was discontinued 12/29-with focus mostly on comfort.    After patient was transition to full comfort care she has remained clinically stable and in fact started showing some signs of improvement around 05/18/2024, afebrile, no signs of active infection.   She remains DNR with plan being to continue gentle medical treatment and look for appropriate placement.  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   Treated with 2 doses  of IVIG on 12/12 and 12/13 without any significant improvement-subsequently given Nplate  on 12/14 and 12/19-thankfully platelet counts now improved  Normocytic anemia   Likely secondary to critical illness-no evidence of blood loss/GI bleeding Required 2 units of PRBC so far  SVT on 12/15 now resting bradycardia with stable blood pressures Required adenosine  Currently stable off of medications, now is resting sinus bradycardia with stable blood pressures, heart rate in mid 50s, TSH free T4 stable.  EKG on 05/28/2024 stable with sinus bradycardia, rate 57 bpm.  Acute metabolic encephalopathy-likely superimposed on underlying dementia. Hospital delirium Acute metabolic encephalopathy initially felt to be secondary to possible sepsis physiology/liver failure-however continued to have waxing/waning mentation-even after treatment of underlying sepsis physiology/improvement in liver function- at this point is secondary to hospital-acquired delirium-probably superimposed on some amount of undiagnosed dementia.  Evaluated by psychiatry-does not have capacity Started on Remeron  and Seroquel  Has now been transition to full comfort measures.  Hypokalemia Replaced  Hypothermia Likely secondary to environmental exposure (per prior notes- cool room with single blanket-no heating or light) TSH stable Now normothermic after supportive care  Acute liver failure Unclear etiology-possibly shock liver from hypotension from possible sepsis  LFTs/coags improving Dopplers without portal/hepatic vein thrombosis Acute hepatitis serology negative CMV IgM positive but viral load negative  Negative workup for autoimmune hepatitis completed N-acetylcysteine  12/10 Empirically  on lactulose  Gastroenterology input appreciated  Oropharyngeal dysphagia Secondary to critical illness/encephalopathy SLP following-on dysphagia 2 diet.  AKI Hemodynamically mediated due to hypotension Resolved.  Hypokalemia/Hypophosphatemia Repleted  Metabolic acidosis Likely due to delayed clearance of lactic acid-in the setting of liver failure. Improved with supportive care  Self-limited episode-small-volume coffee-ground emesis Hb stable PPI By GI, resolved, no procedures.  Chest pain Likely atypical Resolved  Minimally elevated troponins Trend is flat Likely demand ischemia in the setting of hypotension/critical illness. Echo with stable EF  HTN BP stable, add  Norvasc  and monitor.  History of chronic HFpEF Status stable  History of CVA Baseline left hemiparesis Antiplatelets/statin held-in the setting of elevated liver enzymes and thrombocytopenia.  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  It is not felt that she has severe failure to thrive syndrome-extremely poor oral intake that is the primary cause of her hypoglycemia.  Unfortunately-in spite of adding Remeron -she has not significantly improved.  See above discussion regarding goals of care-now full comfort measures.  Pressure Ulcer: Agree with assessment and plan as outlined below Wound 04/14/24 0100 Pressure Injury Hip Left Stage 2 -  Partial thickness loss of dermis presenting as a shallow open injury with a red, pink wound bed without  slough. (Active)     Wound 04/14/24 0100 Pressure Injury Buttocks Left;Right Unstageable - Full thickness tissue loss in which the base of the injury is covered by slough (yellow, tan, gray, green or brown) and/or eschar (tan, brown or black) in the wound bed. (Active)     Wound 04/14/24 0100 Pressure Injury Sacrum Medial Stage 2 -  Partial thickness loss of dermis presenting as a shallow open injury with a red, pink wound bed without slough. (Active)    Code status:   Code Status: Limited: Do  not attempt resuscitation (DNR) -DNR-LIMITED -Do Not Intubate/DNI    DVT Prophylaxis: SCDs Start: 04/13/24 2344    Family Communication: None at bedside.  Disposition Plan: Status is: Inpatient     Diet: Diet Order             DIET DYS 3 Room service appropriate? Yes; Fluid consistency: Thin  Diet effective now                     Data Review:   Patient Lines/Drains/Airways Status     Active Line/Drains/Airways     Name Placement date Placement time Site Days   PICC Single Lumen 05/04/24 Right Basilic 36 cm 0 cm 05/04/24  8676  Basilic  24   External Urinary Catheter 04/29/24  0553  --  29   Wound 04/14/24 0100 Pressure Injury Hip Left Stage 2 -  Partial thickness loss of dermis presenting as a shallow open injury with a red, pink wound bed without slough. 04/14/24  0100  Hip  44   Wound 04/14/24 0100 Pressure Injury Buttocks Left;Right Unstageable - Full thickness tissue loss in which the base of the injury is covered by slough (yellow, tan, gray, green or brown) and/or eschar (tan, brown or black) in the wound bed. 04/14/24  0100  Buttocks  44   Wound 04/14/24 0100 Pressure Injury Sacrum Medial Stage 2 -  Partial thickness loss of dermis presenting as a shallow open injury with a red, pink wound bed without slough. 04/14/24  0100  Sacrum  44             Inpatient Medications  Scheduled Meds:  amLODipine   10 mg Oral Daily   Chlorhexidine  Gluconate Cloth  6 each  Topical Daily   feeding supplement  237 mL Oral TID BM   Gerhardt's butt cream   Topical BID   lactulose   20 g Oral BID   mirtazapine   30 mg Oral QHS   mouth rinse  15 mL Mouth Rinse 4 times per day   pantoprazole   40 mg Oral BID   QUEtiapine   25 mg Oral BID   sodium chloride  flush  10-40 mL Intracatheter Q12H   Continuous Infusions:   PRN Meds:.bisacodyl , docusate, HYDROcodone -acetaminophen , ipratropium-albuterol , LORazepam , melatonin, ondansetron  (ZOFRAN ) IV, mouth rinse, polyethylene glycol, QUEtiapine , sodium chloride  flush  DVT Prophylaxis  SCDs Start: 04/13/24 2344   Recent Labs  Lab 05/24/24 0232 05/27/24 0117  WBC 4.6 4.1  HGB 9.4* 8.9*  HCT 29.0* 27.6*  PLT 66* 62*  MCV 100.7* 100.7*  MCH 32.6 32.5  MCHC 32.4 32.2  RDW 17.7* 17.0*  LYMPHSABS 1.8 1.5  MONOABS 0.7 0.6  EOSABS 0.2 0.3  BASOSABS 0.0 0.0    Recent Labs  Lab 05/24/24 0232 05/27/24 0117  NA 141 139  K 3.4* 3.6  CL 108 104  CO2 28 28  ANIONGAP 6 7  GLUCOSE 80 105*  BUN 21 20  CREATININE 0.49 0.67  AST 36  --   ALT 15  --   ALKPHOS 73  --   BILITOT 0.4  --   ALBUMIN  2.8*  --   INR 1.2  --   TSH  --  3.500  MG 2.0 1.7  PHOS  --  3.1  CALCIUM  8.4* 8.4*       Recent Labs  Lab 05/24/24 0232 05/27/24 0117  INR 1.2  --   TSH  --  3.500  MG 2.0 1.7  CALCIUM  8.4* 8.4*     ---------------------------------------------------------------------------------------------------------------  Lab Results  Component Value Date   CHOL 288 (H) 05/30/2023   HDL 78 05/30/2023   LDLCALC 165 (H) 05/30/2023   TRIG 248 (H) 05/30/2023   CHOLHDL 3.7 05/30/2023    Lab Results  Component Value Date   HGBA1C 5.3 09/11/2020   Recent Labs    05/27/24 0117  TSH 3.500  FREET4 0.97    No results for input(s): VITAMINB12, FOLATE, FERRITIN, TIBC, IRON, RETICCTPCT in the last 72  hours. ------------------------------------------------------------------------------------------------------------------ Cardiac Enzymes No results for input(s): CKMB, TROPONINI, MYOGLOBIN in the last 168 hours.  Invalid input(s): CK  Micro Results No results found for this or any previous visit (from the past 240 hours).   Radiology Reports  No results found.    Signature  -   Lavada Stank M.D on 05/28/2024 at 8:53 AM   -  To page go to [email or web address]  "

## 2024-05-28 NOTE — Plan of Care (Signed)

## 2024-05-29 DIAGNOSIS — K7200 Acute and subacute hepatic failure without coma: Secondary | ICD-10-CM | POA: Diagnosis not present

## 2024-05-29 DIAGNOSIS — D696 Thrombocytopenia, unspecified: Secondary | ICD-10-CM | POA: Diagnosis not present

## 2024-05-29 DIAGNOSIS — E43 Unspecified severe protein-calorie malnutrition: Secondary | ICD-10-CM | POA: Diagnosis not present

## 2024-05-29 NOTE — Progress Notes (Signed)
 Occupational Therapy Treatment Patient Details Name: [name] MRN: [id number] DOB: 06-Oct-1945 Today's Date: 05/29/2024   History of present illness Pt is a 79 y.o. F who presented to [facility] ED 04/13/24 via EMS after she was found by her roommate unresponsive (no heat/light-under a single blanket) covered feces/dried blood. Found to be hypothermic, hypotensive, hypoglycemic; thought to have undifferentiated shock, acute liver failure, AKI. Required ICU for stabilization. 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 Transition to comfort care 12/19. PMHx: HTN, HLD, CVA left hemiparesis, CAPD, DVT, IVH.   OT comments  Pt not making progress with functional goals and is now comfort care. Acute OT services will sign off. Please reconsult if appropriate/able to participate. Recommend assist with all ADLs including self feeding and mechanical lift for OOB mobility      If plan is discharge home, recommend the following:  Two people to help with bathing/dressing/bathroom;Two people to help with walking and/or transfers;Assistance with feeding;Direct supervision/assist for financial management;Assistance with cooking/housework;Direct supervision/assist for medications management;Supervision due to cognitive status   Equipment Recommendations  Other (comment) (defer)    Recommendations for Other Services      Precautions / Restrictions Precautions Precautions: Fall Recall of Precautions/Restrictions: Impaired Precaution/Restrictions Comments: incontinence Restrictions Weight Bearing Restrictions Per Provider Order: No       Mobility Bed Mobility Overal bed mobility: Needs Assistance Bed Mobility: Rolling Rolling: Max assist               Transfers Overall transfer level: Needs assistance                 General transfer comment: will need mechanical lift     Balance                                           ADL either performed or assessed with clinical judgement   ADL Overall ADL's : Needs assistance/impaired   Eating/Feeding Details (indicate cue type and reason): pt refused to allow OT to assist with meal Grooming: Wash/dry hands;Wash/dry face;Set up;Bed level;Supervision/safety   Upper Body Bathing: Maximal assistance;Bed level   Lower Body Bathing: Total assistance;Bed level   Upper Body Dressing : Maximal assistance;Bed level   Lower Body Dressing: Total assistance;Bed level       Toileting- Clothing Manipulation and Hygiene: Total assistance;Bed level              Extremity/Trunk Assessment Upper Extremity Assessment Upper Extremity Assessment: Generalized weakness;LUE deficits/detail LUE Deficits / Details: residual L hemiparesis from prior CVA; LUE flaccid   Lower Extremity Assessment Lower Extremity Assessment: Defer to PT evaluation        Vision   Additional Comments: unable to propelry assess due to cognition   Perception     Praxis     Communication Communication Communication: Impaired   Cognition Arousal: Alert Behavior During Therapy: Flat affect Cognition: No family/caregiver present to determine baseline                               Following commands: Impaired        Cueing   Cueing Techniques: Verbal cues, Tactile cues, Visual cues  Exercises  Pt tolerated PROM of L UE in multiple planes and L UE left positioned on pillow at end of session    Shoulder Instructions       General Comments      Pertinent Vitals/ Pain       Pain Assessment Pain Assessment: Faces Faces Pain Scale: Hurts a little bit Pain Location: generalized, does not specify Pain Descriptors / Indicators: Grimacing Pain Intervention(s): Limited  activity within patient's tolerance, Repositioned  Home Living                                          Prior Functioning/Environment              Frequency           Progress Toward Goals  OT Goals(current goals can now be found in the care plan section)  Progress towards OT goals: Not progressing toward goals - comment (pt now comfort care and OT will sign off)     Plan      Co-evaluation                 AM-PAC OT 6 Clicks Daily Activity     Outcome Measure   Help from another person eating meals?: A Little Help from another person taking care of personal grooming?: A Little Help from another person toileting, which includes using toliet, bedpan, or urinal?: Total Help from another person bathing (including washing, rinsing, drying)?: A Lot Help from another person to put on and taking off regular upper body clothing?: Total Help from another person to put on and taking off regular lower body clothing?: Total 6 Click Score: 11    End of Session    OT Visit Diagnosis: Other abnormalities of gait and mobility (R26.89);Muscle weakness (generalized) (M62.81);Hemiplegia and hemiparesis;Pain Hemiplegia - Right/Left: Left Hemiplegia - dominant/non-dominant: Non-Dominant Hemiplegia - caused by: Unspecified (prior CVA)   Activity Tolerance Patient limited by fatigue;Treatment limited secondary to agitation   Patient Left in bed;with call bell/phone within reach;with bed alarm set   Nurse Communication Mobility status        Time: 1133-1150 OT Time Calculation (min): 17 min  Charges: OT General Charges $OT Visit: 1 Visit OT Treatments $Therapeutic Activity: 8-22 mins    Jacques Karna Loose 05/29/2024, 2:51 PM

## 2024-05-29 NOTE — Progress Notes (Signed)
 "                        PROGRESS NOTE        PATIENT DETAILS Name: [name] Age: 79 y.o. Sex: female Date of Birth: 05/18/1945 Admit Date: 04/13/2024 Admitting Physician Tamela Stakes, MD ERE:Bzmryzrx, Rojelio PARAS, NP  Brief Summary: Patient is a 79 y.o.  female with history of CVA, COPD, HTN-who was brought to the ED after she was found by her roommate unresponsive (no heat/light-under a single blanket)-she was apparently covered in feces/dried blood-found to be hypothermic/hypotensive/hypoglycemic-she was thought to have undifferentiated shock-acute liver failure-acute kidney injury-admitted to the ICU-stabilized with pressors/D10 infusion/broad-spectrum antibiotics-rapidly improved and subsequently transferred to [facility].  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  Unfortunately-in spite of being on IV antibiotics-other supportive measures-an extended hospitalization-patient did not have any significant clinical improvement-oral intake continues to be erratic and mostly poor-palliative care was involved-after extensive discussion with patient's son-was transition to comfort measures on 12/29.  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 12/8>> ANA: Positive 12/8>> anti-smooth muscle antibody: Negative  Significant microbiology data: 12/6>> COVID/influenza/RSV PCR: Negative 12/6>> blood culture: No growth 12/6>> acute hepatitis serology: Negative 12/6>> CMV DNA PCR: Negative 12/9>> blood culture: No growth  Procedures: Line insertion 05/04/2024  Consults: PCCM GI ID Hematology [name]) Palliative care  Subjective:    Significant events overnight as discussed with staff, she is herself denies any complaints today    Objective: Vitals: Blood pressure (!) 142/70, pulse 61, temperature 98.6 F (37 C), temperature source Axillary, resp. rate 14, height 5' 10 (1.778 m), weight 79.4 kg, SpO2 100%.   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  Due to thrombocytopenia-TEE was not pursued-however it was felt that since patient would be treated for 6 weeks-TEE would not change management.  PICC line was placed on 12/27.  Given ANA positive status-nonbacterial thrombotic endocarditis is also possible.   Unfortunately-no significant clinical improvement-has been followed by palliative care services closely-after discussion with patient's son-all antibiotics was discontinued 12/29-with focus mostly on comfort.    After patient was transition to full comfort care she has remained clinically stable and in fact started showing some signs of improvement around 05/18/2024, afebrile, no signs of active infection.  She remains DNR with plan being to continue gentle medical treatment and look for appropriate  placement.  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   Treated with 2 doses of IVIG on 12/12 and 12/13 without any significant  improvement-subsequently given Nplate  on 12/14 and 12/19-thankfully platelet counts now improved  Normocytic anemia   Likely secondary to critical illness-no evidence of blood loss/GI bleeding Required 2 units of PRBC so far  SVT on 12/15 now resting bradycardia with stable blood pressures Required adenosine  Currently stable off of medications, now is resting sinus bradycardia with stable blood pressures, heart rate in mid 50s, TSH free T4 stable.  EKG on 05/28/2024 stable with sinus bradycardia, rate 57 bpm.  Acute metabolic encephalopathy-likely superimposed on underlying dementia. Hospital delirium Acute metabolic encephalopathy initially felt to be secondary to possible sepsis physiology/liver failure-however continued to have waxing/waning mentation-even after treatment of underlying sepsis physiology/improvement in liver function- at this point is secondary to hospital-acquired delirium-probably superimposed on some amount of undiagnosed dementia.  Evaluated by psychiatry-does not have capacity Started on Remeron  and Seroquel  Has now been transition to full comfort measures.  Hypokalemia Replaced  Hypothermia Likely secondary to environmental exposure (per prior notes- cool room with single blanket-no heating or light) TSH stable Now normothermic after supportive care  Acute liver failure Unclear etiology-possibly shock liver from hypotension from possible sepsis  LFTs/coags improving Dopplers without portal/hepatic vein thrombosis Acute hepatitis serology negative CMV IgM positive but viral load negative  Negative workup for autoimmune hepatitis completed N-acetylcysteine  12/10 Empirically on lactulose  Gastroenterology input appreciated  Oropharyngeal dysphagia Secondary to  critical illness/encephalopathy SLP following-on dysphagia 2 diet.  AKI Hemodynamically mediated due to hypotension Resolved.  Hypokalemia/Hypophosphatemia Repleted  Metabolic acidosis Likely due to delayed clearance of lactic acid-in the setting of liver failure. Improved with supportive care  Self-limited episode-small-volume coffee-ground emesis Hb stable PPI By GI, resolved, no procedures.  Chest pain Likely atypical Resolved  Minimally elevated troponins Trend is flat Likely demand ischemia in the setting of hypotension/critical illness. Echo with stable EF  HTN BP stable, add  Norvasc  and monitor.  History of chronic HFpEF Status stable  History of CVA Baseline left hemiparesis Antiplatelets/statin held-in the setting of elevated liver enzymes and thrombocytopenia.  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  It is not felt that she has severe failure to thrive syndrome-extremely poor oral intake that is the primary cause of her hypoglycemia.  Unfortunately-in spite of adding Remeron -she has not significantly improved.  See above discussion regarding goals of care-now full comfort measures.  Pressure Ulcer: Agree with assessment and plan as outlined below Wound 04/14/24 0100 Pressure Injury Hip Left Stage 2 -  Partial thickness loss of dermis presenting as a shallow open injury with a red, pink wound bed without slough. (Active)     Wound 04/14/24 0100 Pressure Injury Buttocks Left;Right Unstageable -  Full thickness tissue loss in which the base of the injury is covered by slough (yellow, tan, gray, green or brown) and/or eschar (tan, brown or black) in the wound bed. (Active)     Wound 04/14/24 0100 Pressure Injury Sacrum Medial Stage 2 -  Partial thickness loss of dermis presenting as a shallow open injury with a red, pink wound bed without slough. (Active)    Code status:   Code Status: Limited: Do not attempt resuscitation (DNR) -DNR-LIMITED -Do Not Intubate/DNI  DVT Prophylaxis: SCDs Start: 04/13/24 2344    Family Communication: None at bedside.  Disposition Plan: Status is: Inpatient     Diet: Diet Order             DIET DYS 3 Room service appropriate? Yes; Fluid consistency: Thin  Diet effective now                     Data Review:   Patient Lines/Drains/Airways Status     Active Line/Drains/Airways     Name Placement date Placement time Site Days   PICC Single Lumen 05/04/24 Right Basilic 36 cm 0 cm 05/04/24  8676  Basilic  25   External Urinary Catheter 04/29/24  0553  --  30   Wound 04/14/24 0100 Pressure Injury Hip Left Stage 2 -  Partial thickness loss of dermis presenting as a shallow open injury with a red, pink wound bed without slough. 04/14/24  0100  Hip  45   Wound 04/14/24 0100 Pressure Injury Buttocks Left;Right Unstageable - Full thickness tissue loss in which the base of the injury is covered by slough (yellow, tan, gray, green or brown) and/or eschar (tan, brown or black) in the wound bed. 04/14/24  0100  Buttocks  45   Wound 04/14/24 0100 Pressure Injury Sacrum Medial Stage 2 -  Partial thickness loss of dermis presenting as a shallow open injury with a red, pink wound bed without slough. 04/14/24  0100  Sacrum  45             Inpatient Medications  Scheduled Meds:  amLODipine   10 mg Oral Daily   Chlorhexidine  Gluconate Cloth  6 each Topical Daily   feeding supplement  237 mL Oral TID BM   Gerhardt's butt cream   Topical BID    lactulose   20 g Oral BID   mirtazapine   30 mg Oral QHS   mouth rinse  15 mL Mouth Rinse 4 times per day   pantoprazole   40 mg Oral BID   QUEtiapine   25 mg Oral BID   sodium chloride  flush  10-40 mL Intracatheter Q12H   Continuous Infusions:   PRN Meds:.bisacodyl , docusate, HYDROcodone -acetaminophen , ipratropium-albuterol , LORazepam , melatonin, ondansetron  (ZOFRAN ) IV, mouth rinse, polyethylene glycol, QUEtiapine , sodium chloride  flush  DVT Prophylaxis  SCDs Start: 04/13/24 2344   Recent Labs  Lab 05/24/24 0232 05/27/24 0117  WBC 4.6 4.1  HGB 9.4* 8.9*  HCT 29.0* 27.6*  PLT 66* 62*  MCV 100.7* 100.7*  MCH 32.6 32.5  MCHC 32.4 32.2  RDW 17.7* 17.0*  LYMPHSABS 1.8 1.5  MONOABS 0.7 0.6  EOSABS 0.2 0.3  BASOSABS 0.0 0.0    Recent Labs  Lab 05/24/24 0232 05/27/24 0117  NA 141 139  K 3.4* 3.6  CL 108 104  CO2 28 28  ANIONGAP 6 7  GLUCOSE 80 105*  BUN 21 20  CREATININE 0.49 0.67  AST 36  --   ALT 15  --   ALKPHOS 73  --   BILITOT 0.4  --   ALBUMIN  2.8*  --   INR 1.2  --   TSH  --  3.500  MG 2.0 1.7  PHOS  --  3.1  CALCIUM  8.4* 8.4*       Recent Labs  Lab 05/24/24 0232 05/27/24 0117  INR 1.2  --   TSH  --  3.500  MG 2.0 1.7  CALCIUM  8.4* 8.4*     --------------------------------------------------------------------------------------------------------------- Lab Results  Component Value Date  CHOL 288 (H) 05/30/2023   HDL 78 05/30/2023   LDLCALC 165 (H) 05/30/2023   TRIG 248 (H) 05/30/2023   CHOLHDL 3.7 05/30/2023    Lab Results  Component Value Date   HGBA1C 5.3 09/11/2020   Recent Labs    05/27/24 0117  TSH 3.500  FREET4 0.97    No results for input(s): VITAMINB12, FOLATE, FERRITIN, TIBC, IRON, RETICCTPCT in the last 72 hours. ------------------------------------------------------------------------------------------------------------------ Cardiac Enzymes No results for input(s): CKMB, TROPONINI, MYOGLOBIN in  the last 168 hours.  Invalid input(s): CK  Micro Results No results found for this or any previous visit (from the past 240 hours).   Radiology Reports  No results found.    Signature  -   Brayton Lye M.D on 05/29/2024 at 4:18 PM   -  To page go to [email or web address]  "

## 2024-05-29 NOTE — TOC Progression Note (Signed)
 Transition of Care [facility]) - Progression Note    Patient Details  Name: Arda Daggs MRN: [id number] Date of Birth: 1945-10-19  [facility] of [facility]) CM/SW Contact  [name], LCSW Phone Number: 05/29/2024, 8:44 AM  Clinical Narrative:    CSW continuing to follow. Awaiting next steps from [facility].    Expected Discharge Plan: Skilled Nursing Facility Barriers to Discharge: SNF Pending bed offer, Inadequate or no insurance ([facility] obtaining guardianship; patient is over assets for LTC Medicaid)               Expected Discharge Plan and Services In-house Referral: Clinical Social Work   Post Acute Care Choice: Skilled Nursing Facility Living arrangements for the past 2 months: Single Family Home                                       Social Drivers of Health (SDOH) Interventions SDOH Screenings   Food Insecurity: Food Insecurity Present (04/15/2024)  Housing: High Risk (04/22/2024)  Transportation Needs: Unmet Transportation Needs (04/15/2024)  Utilities: At Risk (04/15/2024)  Depression (PHQ2-9): Low Risk (06/04/2021)  Social Connections: Unknown (04/15/2024)  Tobacco Use: Medium Risk (05/10/2024)    Readmission Risk Interventions    01/15/2024    2:24 PM  Readmission Risk Prevention Plan  Transportation Screening Complete  HRI or Home Care Consult Complete  Social Work Consult for Recovery Care Planning/Counseling Complete  Palliative Care Screening Not Applicable  Medication Review Oceanographer) Complete

## 2024-05-29 NOTE — Plan of Care (Signed)
  Problem: Education: Goal: Knowledge of General Education information will improve Description: Including pain rating scale, medication(s)/side effects and non-pharmacologic comfort measures Outcome: Progressing   Problem: Health Behavior/Discharge Planning: Goal: Ability to manage health-related needs will improve Outcome: Progressing   Problem: Clinical Measurements: Goal: Ability to maintain clinical measurements within normal limits will improve Outcome: Progressing Goal: Will remain free from infection Outcome: Progressing Goal: Cardiovascular complication will be avoided Outcome: Progressing   Problem: Nutrition: Goal: Adequate nutrition will be maintained Outcome: Progressing   Problem: Coping: Goal: Level of anxiety will decrease Outcome: Progressing   

## 2024-05-30 DIAGNOSIS — E43 Unspecified severe protein-calorie malnutrition: Secondary | ICD-10-CM

## 2024-05-30 DIAGNOSIS — D696 Thrombocytopenia, unspecified: Secondary | ICD-10-CM | POA: Diagnosis not present

## 2024-05-30 NOTE — TOC Progression Note (Addendum)
 Transition of Care [facility]) - Progression Note    Patient Details  Name: [name] MRN: [id number] Date of Birth: 07/07/1945  Transition of Care [facility]) CM/SW Contact  [name], LCSW Phone Number: 05/30/2024, 9:08 AM  Clinical Narrative:    [place] with [facility] continuing to try to reach business office at [place]. CSW requested [facility] review referral per [facility] request.    Expected Discharge Plan: Skilled Nursing Facility Barriers to Discharge: SNF Pending bed offer, Inadequate or no insurance ([facility] obtaining guardianship; patient is over assets for LTC Medicaid)               Expected Discharge Plan and Services In-house Referral: Clinical Social Work   Post Acute Care Choice: Skilled Nursing Facility Living arrangements for the past 2 months: Single Family Home                                       Social Drivers of Health (SDOH) Interventions SDOH Screenings   Food Insecurity: Food Insecurity Present (04/15/2024)  Housing: High Risk (04/22/2024)  Transportation Needs: Unmet Transportation Needs (04/15/2024)  Utilities: At Risk (04/15/2024)  Depression (PHQ2-9): Low Risk (06/04/2021)  Social Connections: Unknown (04/15/2024)  Tobacco Use: Medium Risk (05/10/2024)    Readmission Risk Interventions    01/15/2024    2:24 PM  Readmission Risk Prevention Plan  Transportation Screening Complete  HRI or Home Care Consult Complete  Social Work Consult for Recovery Care Planning/Counseling Complete  Palliative Care Screening Not Applicable  Medication Review Oceanographer) Complete

## 2024-05-30 NOTE — Plan of Care (Signed)

## 2024-05-30 NOTE — Plan of Care (Signed)

## 2024-05-30 NOTE — Progress Notes (Signed)
 "                        PROGRESS NOTE        PATIENT DETAILS Name: [name] Age: 79 y.o. Sex: female Date of Birth: 1945-10-09 Admit Date: 04/13/2024 Admitting Physician Tamela Stakes, MD ERE:Bzmryzrx, Rojelio PARAS, NP  Brief Summary:  Patient is a 79 y.o.  female with history of CVA, COPD, HTN-who was brought to the ED after she was found by her roommate unresponsive (no heat/light-under a single blanket)-she was apparently covered in feces/dried blood-found to be hypothermic/hypotensive/hypoglycemic-she was thought to have undifferentiated shock-acute liver failure-acute kidney injury-admitted to the ICU-stabilized with pressors/D10 infusion/broad-spectrum antibiotics-rapidly improved and subsequently transferred to [facility].  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  Unfortunately-in spite of being on IV antibiotics-other supportive measures-an extended hospitalization-patient did not have any significant clinical improvement-oral intake continues to be erratic and mostly poor-palliative care was involved-after extensive discussion with patient's son-was transition to comfort measures on 12/29.  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 12/8>> ANA: Positive 12/8>> anti-smooth muscle antibody: Negative  Significant microbiology data: 12/6>> COVID/influenza/RSV PCR: Negative 12/6>> blood culture: No growth 12/6>> acute hepatitis serology: Negative 12/6>> CMV DNA PCR: Negative 12/9>> blood culture: No growth  Procedures: Line insertion 05/04/2024  Consults: PCCM GI ID Hematology [name]) Palliative care  Subjective:    No significant events overnight, patient herself unable to provide any reliable complaints today, but as we discussed with staff she is having very poor appetite.   Objective: Vitals: Blood pressure (!) 119/50, pulse 62, temperature 97.8 F (36.6 C), temperature source Axillary, resp. rate 14, height 5' 10 (1.778 m), weight 79.4 kg, SpO2 98%.   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  Due to thrombocytopenia-TEE was not pursued-however it was felt that since patient would be treated for 6 weeks-TEE would not change management.  PICC line was placed on 12/27.  Given ANA positive status-nonbacterial thrombotic endocarditis is also possible.   Unfortunately-no significant clinical improvement-has been followed by palliative care services closely-after discussion with patient's son-all antibiotics was discontinued 12/29-with focus mostly on comfort.   - After patient was transition to full comfort care she has remained clinically stable and in fact started showing some signs of improvement , overall she remains with poor  appetite, not interactive with staff,  afebrile, no signs of active infection.  She remains DNR with plan being to continue gentle medical treatment and look for appropriate placement.  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   Treated with 2 doses of IVIG on 12/12 and 12/13 without any significant improvement-subsequently given Nplate  on 12/14 and 12/19-thankfully platelet counts now improved, but unfortunately trending down again, down to 62 most recently.  Recheck in a.m.  Normocytic anemia   Likely secondary to critical illness-no evidence of blood loss/GI bleeding Required 2 units of PRBC so far  SVT on 12/15 now resting bradycardia with stable blood pressures Required adenosine  Currently stable off of medications, now is resting sinus bradycardia with stable blood pressures, heart rate in mid 50s, TSH free T4 stable.  EKG on 05/28/2024 stable with sinus bradycardia, rate 57 bpm.  Acute metabolic encephalopathy-likely superimposed on underlying dementia. Hospital delirium Acute metabolic encephalopathy initially felt to be secondary to possible sepsis physiology/liver failure-however continued to have waxing/waning mentation-even after treatment of underlying sepsis physiology/improvement in liver function- at this point is secondary to hospital-acquired delirium-probably superimposed on some amount of undiagnosed dementia.  Evaluated by psychiatry-does not have capacity Started on Remeron  and Seroquel  Has now been transition to full comfort measures.  Hypokalemia Replaced  Hypothermia Likely secondary to environmental exposure (per prior notes- cool room with single blanket-no heating or light) TSH stable Now normothermic after supportive care  Acute liver failure Unclear etiology-possibly shock liver from hypotension from possible sepsis  LFTs/coags improving Dopplers without portal/hepatic vein  thrombosis Acute hepatitis serology negative CMV IgM positive but viral load negative  Negative workup for autoimmune hepatitis completed N-acetylcysteine  12/10 Empirically on lactulose  Gastroenterology input appreciated  Oropharyngeal dysphagia Secondary to critical illness/encephalopathy SLP following-on dysphagia 2 diet.  AKI Hemodynamically mediated due to hypotension Resolved.  Hypokalemia/Hypophosphatemia Repleted  Metabolic acidosis Likely due to delayed clearance of lactic acid-in the setting of liver failure. Improved with supportive care  Self-limited episode-small-volume coffee-ground emesis Hb stable PPI By GI, resolved, no procedures.  Chest pain Likely atypical Resolved  Minimally elevated troponins Trend is flat Likely demand ischemia in the setting of hypotension/critical illness. Echo with stable EF  HTN BP stable, add  Norvasc  and monitor.  History of chronic HFpEF Status stable  History of CVA Baseline left hemiparesis Antiplatelets/statin held-in the setting of elevated liver enzymes and thrombocytopenia.  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    Pressure Ulcer: Agree with assessment and plan as outlined below Wound 04/14/24 0100 Pressure Injury Hip Left Stage 2 -  Partial thickness loss of dermis presenting as a shallow open injury with a red, pink wound bed without slough. (Active)     Wound 04/14/24 0100 Pressure Injury Buttocks Left;Right Unstageable - Full  thickness tissue loss in which the base of the injury is covered by slough (yellow, tan, gray, green or brown) and/or eschar (tan, brown or black) in the wound bed. (Active)     Wound 04/14/24 0100 Pressure Injury Sacrum Medial Stage 2 -  Partial thickness loss of dermis presenting as a shallow open injury with a red, pink wound bed without slough. (Active)    Code status:   Code Status: Limited: Do not attempt resuscitation (DNR) -DNR-LIMITED -  Do Not Intubate/DNI    DVT Prophylaxis: SCDs Start: 04/13/24 2344    Family Communication: None at bedside.  Disposition Plan: Status is: Inpatient     Diet: Diet Order             DIET DYS 3 Room service appropriate? Yes; Fluid consistency: Thin  Diet effective now                     Data Review:   Patient Lines/Drains/Airways Status     Active Line/Drains/Airways     Name Placement date Placement time Site Days   PICC Single Lumen 05/04/24 Right Basilic 36 cm 0 cm 05/04/24  8676  Basilic  26   External Urinary Catheter 04/29/24  0553  --  31   Wound 04/14/24 0100 Pressure Injury Hip Left Stage 2 -  Partial thickness loss of dermis presenting as a shallow open injury with a red, pink wound bed without slough. 04/14/24  0100  Hip  46   Wound 04/14/24 0100 Pressure Injury Buttocks Left;Right Unstageable - Full thickness tissue loss in which the base of the injury is covered by slough (yellow, tan, gray, green or brown) and/or eschar (tan, brown or black) in the wound bed. 04/14/24  0100  Buttocks  46   Wound 04/14/24 0100 Pressure Injury Sacrum Medial Stage 2 -  Partial thickness loss of dermis presenting as a shallow open injury with a red, pink wound bed without slough. 04/14/24  0100  Sacrum  46             Inpatient Medications  Scheduled Meds:  amLODipine   10 mg Oral Daily   Chlorhexidine  Gluconate Cloth  6 each Topical Daily   feeding supplement  237 mL Oral TID BM   Gerhardt's butt cream   Topical BID    lactulose   20 g Oral BID   mirtazapine   30 mg Oral QHS   mouth rinse  15 mL Mouth Rinse 4 times per day   pantoprazole   40 mg Oral BID   QUEtiapine   25 mg Oral BID   sodium chloride  flush  10-40 mL Intracatheter Q12H   Continuous Infusions:   PRN Meds:.bisacodyl , docusate, HYDROcodone -acetaminophen , ipratropium-albuterol , LORazepam , melatonin, ondansetron  (ZOFRAN ) IV, mouth rinse, polyethylene glycol, QUEtiapine , sodium chloride  flush  DVT Prophylaxis  SCDs Start: 04/13/24 2344   Recent Labs  Lab 05/24/24 0232 05/27/24 0117  WBC 4.6 4.1  HGB 9.4* 8.9*  HCT 29.0* 27.6*  PLT 66* 62*  MCV 100.7* 100.7*  MCH 32.6 32.5  MCHC 32.4 32.2  RDW 17.7* 17.0*  LYMPHSABS 1.8 1.5  MONOABS 0.7 0.6  EOSABS 0.2 0.3  BASOSABS 0.0 0.0    Recent Labs  Lab 05/24/24 0232 05/27/24 0117  NA 141 139  K 3.4* 3.6  CL 108 104  CO2 28 28  ANIONGAP 6 7  GLUCOSE 80 105*  BUN 21 20  CREATININE 0.49 0.67  AST 36  --   ALT 15  --   ALKPHOS 73  --   BILITOT 0.4  --   ALBUMIN  2.8*  --   INR 1.2  --   TSH  --  3.500  MG 2.0 1.7  PHOS  --  3.1  CALCIUM  8.4* 8.4*       Recent Labs  Lab 05/24/24 0232 05/27/24 0117  INR 1.2  --   TSH  --  3.500  MG 2.0 1.7  CALCIUM  8.4* 8.4*     --------------------------------------------------------------------------------------------------------------- Lab Results  Component Value Date   CHOL 288 (H) 05/30/2023   HDL 78 05/30/2023   LDLCALC 165 (H) 05/30/2023   TRIG 248 (H) 05/30/2023   CHOLHDL 3.7 05/30/2023    Lab Results  Component Value Date   HGBA1C 5.3 09/11/2020   No results for input(s): TSH, T4TOTAL, FREET4, T3FREE, THYROIDAB in the last 72 hours.   No results for input(s): VITAMINB12, FOLATE, FERRITIN, TIBC, IRON, RETICCTPCT in the last 72 hours. ------------------------------------------------------------------------------------------------------------------ Cardiac Enzymes No results for input(s):  CKMB, TROPONINI, MYOGLOBIN in the last 168 hours.  Invalid input(s): CK  Micro Results No results found for this or any previous visit (from the past 240 hours).   Radiology Reports  No results found.    Signature  -   Brayton Lye M.D on 05/30/2024 at 3:39 PM   -  To page go to [email or web address]  "

## 2024-05-31 DIAGNOSIS — K7200 Acute and subacute hepatic failure without coma: Secondary | ICD-10-CM | POA: Diagnosis not present

## 2024-05-31 LAB — CBC
HCT: 24.2 % — ABNORMAL LOW (ref 36.0–46.0)
Hemoglobin: 7.7 g/dL — ABNORMAL LOW (ref 12.0–15.0)
MCH: 32.1 pg (ref 26.0–34.0)
MCHC: 31.8 g/dL (ref 30.0–36.0)
MCV: 100.8 fL — ABNORMAL HIGH (ref 80.0–100.0)
Platelets: 71 K/uL — ABNORMAL LOW (ref 150–400)
RBC: 2.4 MIL/uL — ABNORMAL LOW (ref 3.87–5.11)
RDW: 16.6 % — ABNORMAL HIGH (ref 11.5–15.5)
WBC: 5.1 K/uL (ref 4.0–10.5)
nRBC: 0 % (ref 0.0–0.2)

## 2024-05-31 NOTE — Plan of Care (Signed)

## 2024-05-31 NOTE — Progress Notes (Addendum)
 "                        PROGRESS NOTE        PATIENT DETAILS Name: [name] Age: 79 y.o. Sex: female Date of Birth: [birth year] Admit Date: 04/13/2024 Admitting Physician Tamela Stakes, MD ERE:Bzmryzrx, Rojelio PARAS, NP  Brief Summary:  Patient is a 79 y.o.  female with history of CVA, COPD, HTN-who was brought to the ED after she was found by her roommate unresponsive (no heat/light-under a single blanket)-she was apparently covered in feces/dried blood-found to be hypothermic/hypotensive/hypoglycemic-she was thought to have undifferentiated shock-acute liver failure-acute kidney injury-admitted to the ICU-stabilized with pressors/D10 infusion/broad-spectrum antibiotics-rapidly improved and subsequently transferred to [facility].  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  Unfortunately-in spite of being on IV antibiotics-other supportive measures-an extended hospitalization-patient did not have any significant clinical improvement-oral intake continues to be erratic and mostly poor-palliative care was involved-after extensive discussion with patient's son-was transition to comfort measures on 12/29.  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 12/8>> ANA: Positive 12/8>> anti-smooth muscle antibody: Negative  Significant microbiology data: 12/6>> COVID/influenza/RSV PCR: Negative 12/6>> blood culture: No growth 12/6>> acute hepatitis serology: Negative 12/6>> CMV DNA PCR: Negative 12/9>> blood culture: No growth  Procedures: Line insertion 05/04/2024  Consults: PCCM GI ID Hematology [name]) Palliative care  Subjective:    She denies any complaints today, as discussed with staff as oral intake is unreliable   Objective: Vitals: Blood pressure (!) 104/59, pulse 69, temperature 99 F (37.2 C), temperature source Axillary, resp. rate 16, height 5' 10 (1.778 m), weight 79.4 kg, SpO2 97%.   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  Due to thrombocytopenia-TEE was not pursued-however it was felt that since patient would be treated for 6 weeks-TEE would not change management.  PICC line was placed on 12/27.  Given ANA positive status-nonbacterial thrombotic endocarditis is also possible.   Unfortunately-no significant clinical improvement-has been followed by palliative care services closely-after discussion with patient's son-all antibiotics was discontinued 12/29-with focus mostly on comfort.   - After patient was transition to full comfort care she has remained clinically stable and in fact started showing some signs of improvement , overall she remains with poor appetite, not interactive with staff,  afebrile, no signs of  active infection.  She remains DNR with plan being to continue gentle medical treatment and look for appropriate placement.  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   Treated with 2 doses of IVIG on 12/12 and 12/13 without any significant improvement-subsequently given Nplate  on 12/14 and 12/19-thankfully platelet counts now improved, but unfortunately trending down again, platelet count is 71 this morning  Normocytic anemia   Likely secondary to critical illness-no evidence of blood loss/GI bleeding Required 2 units of PRBC so far - Hemoglobin 7.7 this morning, no indication for transfusion  SVT on 12/15 now resting bradycardia with stable blood pressures Required adenosine  Currently stable off of medications, now is resting sinus bradycardia with stable blood pressures, heart rate in mid 50s, TSH free T4 stable.  EKG on 05/28/2024 stable with sinus bradycardia, rate 57 bpm.  Acute metabolic encephalopathy-likely superimposed on underlying dementia. Hospital delirium Acute metabolic encephalopathy initially felt to be secondary to possible sepsis physiology/liver failure-however continued to have waxing/waning mentation-even after treatment of underlying sepsis physiology/improvement in liver function- at this point is secondary to hospital-acquired delirium-probably superimposed on some amount of undiagnosed dementia.  Evaluated by psychiatry-does not have capacity Started on Remeron  and Seroquel  Has now been transition to full comfort measures.  Hypokalemia Replaced  Hypothermia Likely secondary to environmental exposure (per prior notes- cool room with single blanket-no heating or light) TSH stable Now normothermic after supportive care  Acute liver failure Unclear etiology-possibly shock liver from hypotension from possible sepsis  LFTs/coags improving Dopplers without portal/hepatic vein thrombosis Acute  hepatitis serology negative CMV IgM positive but viral load negative  Negative workup for autoimmune hepatitis completed N-acetylcysteine  12/10 Empirically on lactulose  Gastroenterology input appreciated  Oropharyngeal dysphagia Secondary to critical illness/encephalopathy SLP following-on dysphagia 2 diet.  AKI Hemodynamically mediated due to hypotension Resolved.  Hypokalemia/Hypophosphatemia Repleted  Metabolic acidosis Likely due to delayed clearance of lactic acid-in the setting of liver failure. Improved with supportive care  Self-limited episode-small-volume coffee-ground emesis Hb stable PPI By GI, resolved, no procedures.  Chest pain Likely atypical Resolved  Minimally elevated troponins Trend is flat Likely demand ischemia in the setting of hypotension/critical illness. Echo with stable EF  HTN BP stable, add  Norvasc  and monitor.  History of chronic HFpEF Status stable  History of CVA Baseline left hemiparesis Antiplatelets/statin held-in the setting of elevated liver enzymes and thrombocytopenia.  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    Pressure Ulcer: Agree with assessment and plan as outlined below Wound 04/14/24 0100 Pressure Injury Hip Left Stage 2 -  Partial thickness loss of dermis presenting as a shallow open injury with a red, pink wound bed without slough. (Active)     Wound 04/14/24 0100 Pressure Injury Buttocks Left;Right Unstageable - Full thickness tissue loss  in which the base of the injury is covered by slough (yellow, tan, gray, green or brown) and/or eschar (tan, brown or black) in the wound bed. (Active)     Wound 04/14/24 0100 Pressure Injury Sacrum Medial Stage 2 -  Partial thickness loss of dermis presenting as a shallow open injury with a red, pink wound bed without slough. (Active)    Code status:   Code Status: Limited: Do not attempt resuscitation (DNR) -DNR-LIMITED -Do Not Intubate/DNI  DVT Prophylaxis: SCDs Start: 04/13/24 2344    Family Communication: None at bedside.  Disposition Plan: Status is: Inpatient     Diet: Diet Order             DIET DYS 3 Room service appropriate? Yes; Fluid consistency: Thin  Diet effective now                     Data Review:   Patient Lines/Drains/Airways Status     Active Line/Drains/Airways     Name Placement date Placement time Site Days   PICC Single Lumen 05/04/24 Right Basilic 36 cm 0 cm 05/04/24  8676  Basilic  27   External Urinary Catheter 04/29/24  0553  --  32   Wound 04/14/24 0100 Pressure Injury Hip Left Stage 2 -  Partial thickness loss of dermis presenting as a shallow open injury with a red, pink wound bed without slough. 04/14/24  0100  Hip  47   Wound 04/14/24 0100 Pressure Injury Buttocks Left;Right Unstageable - Full thickness tissue loss in which the base of the injury is covered by slough (yellow, tan, gray, green or brown) and/or eschar (tan, brown or black) in the wound bed. 04/14/24  0100  Buttocks  47   Wound 04/14/24 0100 Pressure Injury Sacrum Medial Stage 2 -  Partial thickness loss of dermis presenting as a shallow open injury with a red, pink wound bed without slough. 04/14/24  0100  Sacrum  47             Inpatient Medications  Scheduled Meds:  amLODipine   10 mg Oral Daily   Chlorhexidine  Gluconate Cloth  6 each Topical Daily   feeding supplement  237 mL Oral TID BM   Gerhardt's butt cream   Topical BID   lactulose   20 g Oral BID    mirtazapine   30 mg Oral QHS   mouth rinse  15 mL Mouth Rinse 4 times per day   pantoprazole   40 mg Oral BID   QUEtiapine   25 mg Oral BID   sodium chloride  flush  10-40 mL Intracatheter Q12H   Continuous Infusions:   PRN Meds:.bisacodyl , docusate, HYDROcodone -acetaminophen , ipratropium-albuterol , LORazepam , melatonin, ondansetron  (ZOFRAN ) IV, mouth rinse, polyethylene glycol, QUEtiapine , sodium chloride  flush  DVT Prophylaxis  SCDs Start: 04/13/24 2344   Recent Labs  Lab 05/27/24 0117 05/31/24 0339  WBC 4.1 5.1  HGB 8.9* 7.7*  HCT 27.6* 24.2*  PLT 62* 71*  MCV 100.7* 100.8*  MCH 32.5 32.1  MCHC 32.2 31.8  RDW 17.0* 16.6*  LYMPHSABS 1.5  --   MONOABS 0.6  --   EOSABS 0.3  --   BASOSABS 0.0  --     Recent Labs  Lab 05/27/24 0117  NA 139  K 3.6  CL 104  CO2 28  ANIONGAP 7  GLUCOSE 105*  BUN 20  CREATININE 0.67  TSH 3.500  MG 1.7  PHOS 3.1  CALCIUM  8.4*       Recent Labs  Lab 05/27/24 0117  TSH 3.500  MG 1.7  CALCIUM  8.4*     --------------------------------------------------------------------------------------------------------------- Lab Results  Component Value Date   CHOL 288 (H) 05/30/2023   HDL 78 05/30/2023   LDLCALC 165 (H) 05/30/2023   TRIG 248 (H) 05/30/2023   CHOLHDL 3.7 05/30/2023    Lab Results  Component Value Date   HGBA1C 5.3 09/11/2020   No results for input(s): TSH, T4TOTAL, FREET4, T3FREE, THYROIDAB in the last 72 hours.   No  results for input(s): VITAMINB12, FOLATE, FERRITIN, TIBC, IRON, RETICCTPCT in the last 72 hours. ------------------------------------------------------------------------------------------------------------------ Cardiac Enzymes No results for input(s): CKMB, TROPONINI, MYOGLOBIN in the last 168 hours.  Invalid input(s): CK  Micro Results No results found for this or any previous visit (from the past 240 hours).   Radiology Reports  No results  found.    Signature  -   Brayton Lye M.D on 05/31/2024 at 4:23 PM   -  To page go to [email or web address]  "

## 2024-06-01 DIAGNOSIS — K7200 Acute and subacute hepatic failure without coma: Secondary | ICD-10-CM | POA: Diagnosis not present

## 2024-06-01 NOTE — Plan of Care (Signed)
  Problem: Clinical Measurements: Goal: Will remain free from infection Outcome: Progressing   Problem: Nutrition: Goal: Adequate nutrition will be maintained Outcome: Progressing   Problem: Elimination: Goal: Will not experience complications related to bowel motility Outcome: Progressing Goal: Will not experience complications related to urinary retention Outcome: Progressing   

## 2024-06-01 NOTE — Progress Notes (Signed)
 "                        PROGRESS NOTE        PATIENT DETAILS Name: [name] Age: 79 y.o. Sex: female Date of Birth: [birth year] Admit Date: 04/13/2024 Admitting Physician Tamela Stakes, MD ERE:Bzmryzrx, Rojelio PARAS, NP  Brief Summary:  Patient is a 79 y.o.  female with history of CVA, COPD, HTN-who was brought to the ED after she was found by her roommate unresponsive (no heat/light-under a single blanket)-she was apparently covered in feces/dried blood-found to be hypothermic/hypotensive/hypoglycemic-she was thought to have undifferentiated shock-acute liver failure-acute kidney injury-admitted to the ICU-stabilized with pressors/D10 infusion/broad-spectrum antibiotics-rapidly improved and subsequently transferred to [facility].  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  Unfortunately-in spite of being on IV antibiotics-other supportive measures-an extended hospitalization-patient did not have any significant clinical improvement-oral intake continues to be erratic and mostly poor-palliative care was involved-after extensive discussion with patient's son-was transition to comfort measures on 12/29.  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 12/8>> ANA: Positive 12/8>> anti-smooth muscle antibody: Negative  Significant microbiology data: 12/6>> COVID/influenza/RSV PCR: Negative 12/6>> blood culture: No growth 12/6>> acute hepatitis serology: Negative 12/6>> CMV DNA PCR: Negative 12/9>> blood culture: No growth  Procedures: Line insertion 05/04/2024  Consults: P[facility]M GI ID Hematology [name]) Palliative care  Subjective:    Poor historian, but he denies denies any complaints, overall appetite has been poor in general, she has been refusing to get out of bed or work with therapist.     Objective: Vitals: Blood pressure 130/68, pulse 72, temperature 98.5 F (36.9 C), temperature source Oral, resp. rate 16, height 5' 10 (1.778 m), weight 79.4 kg, SpO2 97%.   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  Due to thrombocytopenia-TEE was not pursued-however it was felt that since patient would be treated for 6 weeks-TEE would not change management.  PICC line was placed on 12/27.  Given ANA positive status-nonbacterial thrombotic endocarditis is also possible.   Unfortunately-no significant clinical improvement-has been followed by palliative care services closely-after discussion with patient's son-all antibiotics was discontinued 12/29-with focus mostly on comfort.   - After patient was transition to full comfort care she has remained clinically stable and in fact started showing some signs of improvement , overall  she remains with poor appetite, not interactive with staff,  afebrile, no signs of active infection.  She remains DNR with plan being to  continue gentle medical treatment and look for appropriate placement.  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   Treated with 2 doses of IVIG on 12/12 and 12/13 without any significant improvement-subsequently given Nplate  on 12/14 and 12/19-thankfully platelet counts now improved, but unfortunately trending down again, platelet count is 71 this morning  Normocytic anemia   Likely secondary to critical illness-no evidence of blood loss/GI bleeding Required 2 units of PRBC so far - Hemoglobin 7.7 this morning, no indication for transfusion  SVT on 12/15 now resting bradycardia with stable blood pressures Required adenosine  Currently stable off of medications, now is resting sinus bradycardia with stable blood pressures, heart rate in mid 50s, TSH free T4 stable.  EKG on 05/28/2024 stable with sinus bradycardia, rate 57 bpm.  Acute metabolic encephalopathy-likely superimposed on underlying dementia. Hospital delirium Acute metabolic encephalopathy initially felt to be secondary to possible sepsis physiology/liver failure-however continued to have waxing/waning mentation-even after treatment of underlying sepsis physiology/improvement in liver function- at this point is secondary to hospital-acquired delirium-probably superimposed on some amount of undiagnosed dementia.  Evaluated by psychiatry-does not have capacity Started on Remeron  and Seroquel  Has now been transition to full comfort measures.  Hypokalemia Replaced  Hypothermia Likely secondary to environmental exposure (per prior notes- cool room with single blanket-no heating or light) TSH stable Now normothermic after supportive care  Acute liver failure Unclear etiology-possibly shock liver from hypotension from possible sepsis   LFTs/coags improving Dopplers without portal/hepatic vein thrombosis Acute hepatitis serology negative CMV IgM positive but viral load negative  Negative workup for autoimmune hepatitis completed N-acetylcysteine  12/10 Empirically on lactulose  Gastroenterology input appreciated  Oropharyngeal dysphagia Secondary to critical illness/encephalopathy SLP following-on dysphagia 2 diet.  AKI Hemodynamically mediated due to hypotension Resolved.  Hypokalemia/Hypophosphatemia Repleted  Metabolic acidosis Likely due to delayed clearance of lactic acid-in the setting of liver failure. Improved with supportive care  Self-limited episode-small-volume coffee-ground emesis Hb stable PPI By GI, resolved, no procedures.  Chest pain Likely atypical Resolved  Minimally elevated troponins Trend is flat Likely demand ischemia in the setting of hypotension/critical illness. Echo with stable EF  HTN BP stable, add  Norvasc  and monitor.  History of chronic HFpEF Status stable  History of CVA Baseline left hemiparesis Antiplatelets/statin held-in the setting of elevated liver enzymes and thrombocytopenia.  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    Pressure Ulcer: Agree with assessment and plan as outlined below Wound 04/14/24 0100 Pressure Injury Hip Left Stage 2 -  Partial thickness loss of dermis presenting as a shallow open injury with a red, pink wound bed without slough. (Active)     Wound 04/14/24  0100 Pressure Injury Buttocks Left;Right Unstageable - Full thickness tissue loss in which the base of the injury is covered by slough (yellow, tan, gray, green or brown) and/or eschar (tan, brown or black) in the wound bed. (Active)     Wound 04/14/24 0100 Pressure Injury Sacrum Medial Stage 2 -  Partial thickness loss of dermis presenting as a shallow open injury with a red, pink wound bed without slough. (Active)  Code status:   Code Status: Limited: Do not attempt resuscitation (DNR) -DNR-LIMITED -Do Not Intubate/DNI    DVT Prophylaxis: SCDs Start: 04/13/24 2344    Family Communication: None at bedside.  Disposition Plan: Status is: Inpatient     Diet: Diet Order             DIET DYS 3 Room service appropriate? Yes; Fluid consistency: Thin  Diet effective now                     Data Review:   Patient Lines/Drains/Airways Status     Active Line/Drains/Airways     Name Placement date Placement time Site Days   PICC Single Lumen 05/04/24 Right Basilic 36 cm 0 cm 05/04/24  8676  Basilic  28   External Urinary Catheter 04/29/24  0553  --  33   Wound 04/14/24 0100 Pressure Injury Hip Left Stage 2 -  Partial thickness loss of dermis presenting as a shallow open injury with a red, pink wound bed without slough. 04/14/24  0100  Hip  48   Wound 04/14/24 0100 Pressure Injury Buttocks Left;Right Unstageable - Full thickness tissue loss in which the base of the injury is covered by slough (yellow, tan, gray, green or brown) and/or eschar (tan, brown or black) in the wound bed. 04/14/24  0100  Buttocks  48   Wound 04/14/24 0100 Pressure Injury Sacrum Medial Stage 2 -  Partial thickness loss of dermis presenting as a shallow open injury with a red, pink wound bed without slough. 04/14/24  0100  Sacrum  48             Inpatient Medications  Scheduled Meds:  amLODipine   10 mg Oral Daily   Chlorhexidine  Gluconate Cloth  6 each Topical Daily   feeding supplement  237 mL  Oral TID BM   Gerhardt's butt cream   Topical BID   lactulose   20 g Oral BID   mirtazapine   30 mg Oral QHS   mouth rinse  15 mL Mouth Rinse 4 times per day   pantoprazole   40 mg Oral BID   QUEtiapine   25 mg Oral BID   sodium chloride  flush  10-40 mL Intracatheter Q12H   Continuous Infusions:   PRN Meds:.bisacodyl , docusate, HYDROcodone -acetaminophen , ipratropium-albuterol , LORazepam , melatonin, ondansetron  (ZOFRAN ) IV, mouth rinse, polyethylene glycol, QUEtiapine , sodium chloride  flush  DVT Prophylaxis  SCDs Start: 04/13/24 2344   Recent Labs  Lab 05/27/24 0117 05/31/24 0339  WBC 4.1 5.1  HGB 8.9* 7.7*  HCT 27.6* 24.2*  PLT 62* 71*  MCV 100.7* 100.8*  MCH 32.5 32.1  MCHC 32.2 31.8  RDW 17.0* 16.6*  LYMPHSABS 1.5  --   MONOABS 0.6  --   EOSABS 0.3  --   BASOSABS 0.0  --     Recent Labs  Lab 05/27/24 0117  NA 139  K 3.6  CL 104  CO2 28  ANIONGAP 7  GLUCOSE 105*  BUN 20  CREATININE 0.67  TSH 3.500  MG 1.7  PHOS 3.1  CALCIUM  8.4*       Recent Labs  Lab 05/27/24 0117  TSH 3.500  MG 1.7  CALCIUM  8.4*     --------------------------------------------------------------------------------------------------------------- Lab Results  Component Value Date   CHOL 288 (H) 05/30/2023   HDL 78 05/30/2023   LDLCALC 165 (H) 05/30/2023   TRIG 248 (H) 05/30/2023   CHOLHDL 3.7 05/30/2023    Lab Results  Component Value Date   HGBA1C 5.3 09/11/2020  No results for input(s): TSH, T4TOTAL, FREET4, T3FREE, THYROIDAB in the last 72 hours.   No results for input(s): VITAMINB12, FOLATE, FERRITIN, TIBC, IRON, RETICCTPCT in the last 72 hours. ------------------------------------------------------------------------------------------------------------------ Cardiac Enzymes No results for input(s): CKMB, TROPONINI, MYOGLOBIN in the last 168 hours.  Invalid input(s): CK  Micro Results No results found for this or any previous visit  (from the past 240 hours).   Radiology Reports  No results found.    Signature  -   Brayton Lye M.D on 06/01/2024 at 1:24 PM   -  To page go to [email or web address]  "

## 2024-06-02 DIAGNOSIS — K7200 Acute and subacute hepatic failure without coma: Secondary | ICD-10-CM | POA: Diagnosis not present

## 2024-06-02 MED ORDER — PNEUMOCOCCAL 20-VAL CONJ VACC 0.5 ML IM SUSY
0.5000 mL | PREFILLED_SYRINGE | INTRAMUSCULAR | Status: AC
  Administered 2024-06-03: 0.5 mL via INTRAMUSCULAR
  Filled 2024-06-02: qty 0.5

## 2024-06-02 NOTE — TOC Progression Note (Signed)
 Transition of Care [facility]) - Progression Note    Patient Details  Name: [name] MRN: [id number] Date of Birth: 09/23/1945  Transition of Care [facility]) CM/SW Contact  [name], LCSW Phone Number: 06/02/2024, 11:24 AM  Clinical Narrative:    Continuing to await response from [facility].    Expected Discharge Plan: Skilled Nursing Facility Barriers to Discharge: SNF Pending bed offer, Inadequate or no insurance (APS obtaining guardianship; patient is over assets for LTC Medicaid)               Expected Discharge Plan and Services In-house Referral: Clinical Social Work   Post Acute Care Choice: Skilled Nursing Facility Living arrangements for the past 2 months: Single Family Home                                       Social Drivers of Health (SDOH) Interventions SDOH Screenings   Food Insecurity: Food Insecurity Present (04/15/2024)  Housing: High Risk (04/22/2024)  Transportation Needs: Unmet Transportation Needs (04/15/2024)  Utilities: At Risk (04/15/2024)  Depression (PHQ2-9): Low Risk (06/04/2021)  Social Connections: Unknown (04/15/2024)  Tobacco Use: Medium Risk (05/10/2024)    Readmission Risk Interventions    01/15/2024    2:24 PM  Readmission Risk Prevention Plan  Transportation Screening Complete  HRI or Home Care Consult Complete  Social Work Consult for Recovery Care Planning/Counseling Complete  Palliative Care Screening Not Applicable  Medication Review Oceanographer) Complete

## 2024-06-02 NOTE — Progress Notes (Signed)
 "                        PROGRESS NOTE        PATIENT DETAILS Name: [name] Age: 79 y.o. Sex: female Date of Birth: July 30, 1945 Admit Date: 04/13/2024 Admitting Physician Tamela Stakes, MD ERE:Bzmryzrx, Rojelio PARAS, NP  Brief Summary:  Patient is a 79 y.o.  female with history of CVA, COPD, HTN-who was brought to the ED after she was found by her roommate unresponsive (no heat/light-under a single blanket)-she was apparently covered in feces/dried blood-found to be hypothermic/hypotensive/hypoglycemic-she was thought to have undifferentiated shock-acute liver failure-acute kidney injury-admitted to the ICU-stabilized with pressors/D10 infusion/broad-spectrum antibiotics-rapidly improved and subsequently transferred to [facility].  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  Unfortunately-in spite of being on IV antibiotics-other supportive measures-an extended hospitalization-patient did not have any significant clinical improvement-oral intake continues to be erratic and mostly poor-palliative care was involved-after extensive discussion with patient's son-was transition to comfort measures on 12/29.  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 12/8>> ANA: Positive 12/8>> anti-smooth muscle antibody: Negative  Significant microbiology data: 12/6>> COVID/influenza/RSV PCR: Negative 12/6>> blood culture: No growth 12/6>> acute hepatitis serology: Negative 12/6>> CMV DNA PCR: Negative 12/9>> blood culture: No growth  Procedures: Line insertion 05/04/2024  Consults: PCCM GI ID Hematology [name]) Palliative care  Subjective:    Patient denies any complaints, as discussed with staff she is with poor appetite, has been refusing to get out of bed to chair with the staff.   Objective: Vitals: Blood pressure 122/60, pulse 62, temperature 98.7 F (37.1 C), temperature source Axillary, resp. rate 20, height 5' 10 (1.778 m), weight 79.4 kg, SpO2 100%.   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  Due to thrombocytopenia-TEE was not pursued-however it was felt that since patient would be treated for 6 weeks-TEE would not change management.  PICC line was placed on 12/27.  Given ANA positive status-nonbacterial thrombotic endocarditis is also possible.   Unfortunately-no significant clinical improvement-has been followed by palliative care services closely-after discussion with patient's son-all antibiotics was discontinued 12/29-with focus mostly on comfort.   - After patient was transition to full comfort care she has remained clinically stable and in fact started showing some signs of improvement , overall  she remains with poor appetite, not interactive with staff,  afebrile, no signs of active infection.  She remains DNR with plan  being to continue gentle medical treatment and look for appropriate placement.  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   Treated with 2 doses of IVIG on 12/12 and 12/13 without any significant improvement-subsequently given Nplate  on 12/14 and 12/19-thankfully platelet counts now improved, but unfortunately trending down again, platelet count is 71 this morning  Normocytic anemia   Likely secondary to critical illness-no evidence of blood loss/GI bleeding Required 2 units of PRBC so far - Hemoglobin 7.7 this morning, no indication for transfusion  SVT on 12/15 now resting bradycardia with stable blood pressures Required adenosine  Currently stable off of medications, now is resting sinus bradycardia with stable blood pressures, heart rate in mid 50s, TSH free T4 stable.  EKG on 05/28/2024 stable with sinus bradycardia, rate 57 bpm.  Acute metabolic encephalopathy-likely superimposed on underlying dementia. Hospital delirium Acute metabolic encephalopathy initially felt to be secondary to possible sepsis physiology/liver failure-however continued to have waxing/waning mentation-even after treatment of underlying sepsis physiology/improvement in liver function- at this point is secondary to hospital-acquired delirium-probably superimposed on some amount of undiagnosed dementia.  Evaluated by psychiatry-does not have capacity Started on Remeron  and Seroquel  Has now been transition to full comfort measures.  Hypokalemia Replaced  Hypothermia Likely secondary to environmental exposure (per prior notes- cool room with single blanket-no heating or light) TSH stable Now normothermic after supportive care  Acute liver failure Unclear etiology-possibly shock liver from hypotension from possible sepsis   LFTs/coags improving Dopplers without portal/hepatic vein thrombosis Acute hepatitis serology negative CMV IgM positive but viral load negative  Negative workup for autoimmune hepatitis completed N-acetylcysteine  12/10 Empirically on lactulose  Gastroenterology input appreciated  Oropharyngeal dysphagia Secondary to critical illness/encephalopathy SLP following-on dysphagia 2 diet.  AKI Hemodynamically mediated due to hypotension Resolved.  Hypokalemia/Hypophosphatemia Repleted  Metabolic acidosis Likely due to delayed clearance of lactic acid-in the setting of liver failure. Improved with supportive care  Self-limited episode-small-volume coffee-ground emesis Hb stable PPI By GI, resolved, no procedures.  Chest pain Likely atypical Resolved  Minimally elevated troponins Trend is flat Likely demand ischemia in the setting of hypotension/critical illness. Echo with stable EF  HTN BP stable, add  Norvasc  and monitor.  History of chronic HFpEF Status stable  History of CVA Baseline left hemiparesis Antiplatelets/statin held-in the setting of elevated liver enzymes and thrombocytopenia.  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    Pressure Ulcer: Agree with assessment and plan as outlined below Wound 04/14/24 0100 Pressure Injury Hip Left Stage 2 -  Partial thickness loss of dermis presenting as a shallow open injury with a red, pink wound bed without slough. (Active)     Wound 04/14/24  0100 Pressure Injury Buttocks Left;Right Unstageable - Full thickness tissue loss in which the base of the injury is covered by slough (yellow, tan, gray, green or brown) and/or eschar (tan, brown or black) in the wound bed. (Active)     Wound 04/14/24 0100 Pressure Injury Sacrum Medial Stage 2 -  Partial thickness loss of dermis presenting as a shallow open injury with a red, pink wound bed without  slough. (Active)    Code status:   Code Status: Limited: Do not attempt resuscitation (DNR) -DNR-LIMITED -Do Not Intubate/DNI    DVT Prophylaxis: SCDs Start: 04/13/24 2344    Family Communication: None at bedside.  Disposition Plan: Status is: Inpatient     Diet: Diet Order             DIET DYS 3 Room service appropriate? Yes; Fluid consistency: Thin  Diet effective now                     Data Review:   Patient Lines/Drains/Airways Status     Active Line/Drains/Airways     Name Placement date Placement time Site Days   PICC Single Lumen 05/04/24 Right Basilic 36 cm 0 cm 05/04/24  8676  Basilic  29   External Urinary Catheter 04/29/24  0553  --  34   Wound 04/14/24 0100 Pressure Injury Hip Left Stage 2 -  Partial thickness loss of dermis presenting as a shallow open injury with a red, pink wound bed without slough. 04/14/24  0100  Hip  49   Wound 04/14/24 0100 Pressure Injury Buttocks Left;Right Unstageable - Full thickness tissue loss in which the base of the injury is covered by slough (yellow, tan, gray, green or brown) and/or eschar (tan, brown or black) in the wound bed. 04/14/24  0100  Buttocks  49   Wound 04/14/24 0100 Pressure Injury Sacrum Medial Stage 2 -  Partial thickness loss of dermis presenting as a shallow open injury with a red, pink wound bed without slough. 04/14/24  0100  Sacrum  49             Inpatient Medications  Scheduled Meds:  amLODipine   10 mg Oral Daily   Chlorhexidine  Gluconate Cloth  6 each Topical Daily   feeding supplement  237 mL  Oral TID BM   Gerhardt's butt cream   Topical BID   lactulose   20 g Oral BID   mirtazapine   30 mg Oral QHS   mouth rinse  15 mL Mouth Rinse 4 times per day   pantoprazole   40 mg Oral BID   QUEtiapine   25 mg Oral BID   sodium chloride  flush  10-40 mL Intracatheter Q12H   Continuous Infusions:   PRN Meds:.bisacodyl , docusate, HYDROcodone -acetaminophen , ipratropium-albuterol , LORazepam , melatonin, ondansetron  (ZOFRAN ) IV, mouth rinse, polyethylene glycol, QUEtiapine , sodium chloride  flush  DVT Prophylaxis  SCDs Start: 04/13/24 2344   Recent Labs  Lab 05/27/24 0117 05/31/24 0339  WBC 4.1 5.1  HGB 8.9* 7.7*  HCT 27.6* 24.2*  PLT 62* 71*  MCV 100.7* 100.8*  MCH 32.5 32.1  MCHC 32.2 31.8  RDW 17.0* 16.6*  LYMPHSABS 1.5  --   MONOABS 0.6  --   EOSABS 0.3  --   BASOSABS 0.0  --     Recent Labs  Lab 05/27/24 0117  NA 139  K 3.6  CL 104  CO2 28  ANIONGAP 7  GLUCOSE 105*  BUN 20  CREATININE 0.67  TSH 3.500  MG 1.7  PHOS 3.1  CALCIUM  8.4*       Recent Labs  Lab 05/27/24 0117  TSH 3.500  MG 1.7  CALCIUM  8.4*     --------------------------------------------------------------------------------------------------------------- Lab Results  Component Value Date   CHOL 288 (H) 05/30/2023   HDL 78 05/30/2023   LDLCALC 165 (H) 05/30/2023   TRIG 248 (H) 05/30/2023   CHOLHDL 3.7 05/30/2023    Lab Results  Component Value Date  HGBA1C 5.3 09/11/2020   No results for input(s): TSH, T4TOTAL, FREET4, T3FREE, THYROIDAB in the last 72 hours.   No results for input(s): VITAMINB12, FOLATE, FERRITIN, TIBC, IRON, RETICCTPCT in the last 72 hours. ------------------------------------------------------------------------------------------------------------------ Cardiac Enzymes No results for input(s): CKMB, TROPONINI, MYOGLOBIN in the last 168 hours.  Invalid input(s): CK  Micro Results No results found for this or any previous visit  (from the past 240 hours).   Radiology Reports  No results found.    Signature  -   Brayton Lye M.D on 06/02/2024 at 2:27 PM   -  To page go to [email or web address]  "

## 2024-06-02 NOTE — Plan of Care (Signed)
?  Problem: Education: ?Goal: Knowledge of General Education information will improve ?Description: Including pain rating scale, medication(s)/side effects and non-pharmacologic comfort measures ?Outcome: Not Progressing ?  ?Problem: Health Behavior/Discharge Planning: ?Goal: Ability to manage health-related needs will improve ?Outcome: Not Progressing ?  ?Problem: Clinical Measurements: ?Goal: Ability to maintain clinical measurements within normal limits will improve ?Outcome: Progressing ?Goal: Will remain free from infection ?Outcome: Progressing ?  ?

## 2024-06-03 DIAGNOSIS — K7200 Acute and subacute hepatic failure without coma: Secondary | ICD-10-CM | POA: Diagnosis not present

## 2024-06-03 NOTE — Progress Notes (Signed)
 "                        PROGRESS NOTE        PATIENT DETAILS Name: [name] Age: 79 y.o. Sex: female Date of Birth: 23-Oct-1945 Admit Date: 04/13/2024 Admitting Physician Tamela Stakes, MD ERE:Bzmryzrx, Rojelio PARAS, NP  Brief Summary:  Patient is a 79 y.o.  female with history of CVA, COPD, HTN-who was brought to the ED after she was found by her roommate unresponsive (no heat/light-under a single blanket)-she was apparently covered in feces/dried blood-found to be hypothermic/hypotensive/hypoglycemic-she was thought to have undifferentiated shock-acute liver failure-acute kidney injury-admitted to the ICU-stabilized with pressors/D10 infusion/broad-spectrum antibiotics-rapidly improved and subsequently transferred to [facility].  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  Unfortunately-in spite of being on IV antibiotics-other supportive measures-an extended hospitalization-patient did not have any significant clinical improvement-oral intake continues to be erratic and mostly poor-palliative care was involved-after extensive discussion with patient's son-was transition to comfort measures on 12/29.  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 12/8>> ANA: Positive 12/8>> anti-smooth muscle antibody: Negative  Significant microbiology data: 12/6>> COVID/influenza/RSV PCR: Negative 12/6>> blood culture: No growth 12/6>> acute hepatitis serology: Negative 12/6>> CMV DNA PCR: Negative 12/9>> blood culture: No growth  Procedures: Line insertion 05/04/2024  Consults: PCCM GI ID Hematology [name]) Palliative care  Subjective:    Discussed with staff, she has poor appetite, patient denies any complaints, but she is poor historian.    Objective: Vitals: Blood pressure (!) 126/56, pulse 65, temperature 98.2 F (36.8 C), temperature source Oral, resp. rate 17, height 5' 10 (1.778 m), weight 79.4 kg, SpO2 99%.   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  Due to thrombocytopenia-TEE was not pursued-however it was felt that since patient would be treated for 6 weeks-TEE would not change management.  PICC line was placed on 12/27.  Given ANA positive status-nonbacterial thrombotic endocarditis is also possible.   Unfortunately-no significant clinical improvement-has been followed by palliative care services closely-after discussion with patient's son-all antibiotics was discontinued 12/29-with focus mostly on comfort.   - After patient was transition to full comfort care she has remained clinically stable and in fact started showing some signs of improvement , overall she remains with poor appetite, not interactive with staff,  afebrile, no signs of active  infection.  She remains DNR with plan being to continue gentle medical treatment and look for appropriate placement.  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   Treated with 2 doses of IVIG on 12/12 and 12/13 without any significant improvement-subsequently given Nplate  on 12/14 and 12/19-thankfully platelet counts now improved, but unfortunately trending down again, platelet count is 71 this morning  Normocytic anemia   Likely secondary to critical illness-no evidence of blood loss/GI bleeding Required 2 units of PRBC so far - Hemoglobin 7.7 this morning, no indication for transfusion  SVT on 12/15 now resting bradycardia with stable blood pressures Required adenosine  Currently stable off of medications, now is resting sinus bradycardia with stable blood pressures, heart rate in mid 50s, TSH free T4 stable.  EKG on 05/28/2024 stable with sinus bradycardia, rate 57 bpm.  Acute metabolic encephalopathy-likely superimposed on underlying dementia. Hospital delirium Acute metabolic encephalopathy initially felt to be secondary to possible sepsis physiology/liver failure-however continued to have waxing/waning mentation-even after treatment of underlying sepsis physiology/improvement in liver function- at this point is secondary to hospital-acquired delirium-probably superimposed on some amount of undiagnosed dementia.  Evaluated by psychiatry-does not have capacity Started on Remeron  and Seroquel  Has now been transition to full comfort measures.  Hypokalemia Replaced  Hypothermia Likely secondary to environmental exposure (per prior notes- cool room with single blanket-no heating or light) TSH stable Now normothermic after supportive care  Acute liver failure Unclear etiology-possibly shock liver from hypotension from possible sepsis  LFTs/coags improving Dopplers without portal/hepatic vein thrombosis Acute hepatitis  serology negative CMV IgM positive but viral load negative  Negative workup for autoimmune hepatitis completed N-acetylcysteine  12/10 Empirically on lactulose  Gastroenterology input appreciated  Oropharyngeal dysphagia Secondary to critical illness/encephalopathy SLP following-on dysphagia 2 diet.  AKI Hemodynamically mediated due to hypotension Resolved.  Hypokalemia/Hypophosphatemia Repleted  Metabolic acidosis Likely due to delayed clearance of lactic acid-in the setting of liver failure. Improved with supportive care  Self-limited episode-small-volume coffee-ground emesis Hb stable PPI By GI, resolved, no procedures.  Chest pain Likely atypical Resolved  Minimally elevated troponins Trend is flat Likely demand ischemia in the setting of hypotension/critical illness. Echo with stable EF  HTN BP stable, add  Norvasc  and monitor.  History of chronic HFpEF Status stable  History of CVA Baseline left hemiparesis Antiplatelets/statin held-in the setting of elevated liver enzymes and thrombocytopenia.  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    Pressure Ulcer: Agree with assessment and plan as outlined below Wound 04/14/24 0100 Pressure Injury Hip Left Stage 2 -  Partial thickness loss of dermis presenting as a shallow open injury with a red, pink wound bed without slough. (Active)     Wound 04/14/24 0100 Pressure Injury Buttocks Left;Right Unstageable - Full thickness tissue loss in which  the base of the injury is covered by slough (yellow, tan, gray, green or brown) and/or eschar (tan, brown or black) in the wound bed. (Active)     Wound 04/14/24 0100 Pressure Injury Sacrum Medial Stage 2 -  Partial thickness loss of dermis presenting as a shallow open injury with a red, pink wound bed without slough. (Active)    Code status:   Code Status: Limited: Do not attempt resuscitation (DNR) -DNR-LIMITED -Do Not  Intubate/DNI    DVT Prophylaxis: SCDs Start: 04/13/24 2344    Family Communication: None at bedside.  Disposition Plan: Status is: Inpatient     Diet: Diet Order             DIET DYS 3 Room service appropriate? Yes; Fluid consistency: Thin  Diet effective now                     Data Review:   Patient Lines/Drains/Airways Status     Active Line/Drains/Airways     Name Placement date Placement time Site Days   PICC Single Lumen 05/04/24 Right Basilic 36 cm 0 cm 05/04/24  8676  Basilic  30   External Urinary Catheter 04/29/24  0553  --  35   Wound 04/14/24 0100 Pressure Injury Hip Left Stage 2 -  Partial thickness loss of dermis presenting as a shallow open injury with a red, pink wound bed without slough. 04/14/24  0100  Hip  50   Wound 04/14/24 0100 Pressure Injury Buttocks Left;Right Unstageable - Full thickness tissue loss in which the base of the injury is covered by slough (yellow, tan, gray, green or brown) and/or eschar (tan, brown or black) in the wound bed. 04/14/24  0100  Buttocks  50   Wound 04/14/24 0100 Pressure Injury Sacrum Medial Stage 2 -  Partial thickness loss of dermis presenting as a shallow open injury with a red, pink wound bed without slough. 04/14/24  0100  Sacrum  50             Inpatient Medications  Scheduled Meds:  amLODipine   10 mg Oral Daily   Chlorhexidine  Gluconate Cloth  6 each Topical Daily   feeding supplement  237 mL Oral TID BM   Gerhardt's butt cream   Topical BID   lactulose   20 g Oral BID    mirtazapine   30 mg Oral QHS   mouth rinse  15 mL Mouth Rinse 4 times per day   pantoprazole   40 mg Oral BID   QUEtiapine   25 mg Oral BID   sodium chloride  flush  10-40 mL Intracatheter Q12H   Continuous Infusions:   PRN Meds:.bisacodyl , docusate, HYDROcodone -acetaminophen , ipratropium-albuterol , LORazepam , melatonin, ondansetron  (ZOFRAN ) IV, mouth rinse, polyethylene glycol, QUEtiapine , sodium chloride  flush  DVT Prophylaxis  SCDs Start: 04/13/24 2344   Recent Labs  Lab 05/31/24 0339  WBC 5.1  HGB 7.7*  HCT 24.2*  PLT 71*  MCV 100.8*  MCH 32.1  MCHC 31.8  RDW 16.6*    No results for input(s): NA, K, CL, CO2, ANIONGAP, GLUCOSE, BUN, CREATININE, AST, ALT, ALKPHOS, BILITOT, ALBUMIN , CRP, DDIMER, PROCALCITON, LATICACIDVEN, INR, TSH, CORTISOL, HGBA1C, AMMONIA, BNP, MG, PHOS, CALCIUM  in the last 168 hours.  Invalid input(s): GFRCGP      No results for input(s): CRP, DDIMER, PROCALCITON, LATICACIDVEN, INR, TSH, CORTISOL, HGBA1C, AMMONIA, BNP, MG, CALCIUM  in the last 168 hours.  Invalid input(s): PHOSPHOROUS    --------------------------------------------------------------------------------------------------------------- Lab Results  Component Value Date   CHOL 288 (H) 05/30/2023   HDL 78 05/30/2023   LDLCALC 165 (H) 05/30/2023   TRIG 248 (H) 05/30/2023   CHOLHDL 3.7 05/30/2023    Lab Results  Component Value Date   HGBA1C 5.3 09/11/2020   No results for input(s): TSH, T4TOTAL, FREET4, T3FREE, THYROIDAB in the last 72 hours.   No results for input(s): VITAMINB12, FOLATE, FERRITIN, TIBC, IRON, RETICCTPCT in the last 72 hours. ------------------------------------------------------------------------------------------------------------------ Cardiac Enzymes No results for input(s): CKMB, TROPONINI, MYOGLOBIN in  the last 168 hours.  Invalid input(s):  CK  Micro Results No results found for this or any previous visit (from the past 240 hours).   Radiology Reports  No results found.    Signature  -   Brayton Lye M.D on 06/03/2024 at 1:35 PM   -  To page go to [email or web address]  "

## 2024-06-03 NOTE — Plan of Care (Signed)

## 2024-06-04 DIAGNOSIS — K7200 Acute and subacute hepatic failure without coma: Secondary | ICD-10-CM | POA: Diagnosis not present

## 2024-06-04 NOTE — Progress Notes (Signed)
 "                        PROGRESS NOTE        PATIENT DETAILS Name: [name] Age: 79 y.o. Sex: female Date of Birth: 09-Apr-1946 Admit Date: 04/13/2024 Admitting Physician Tamela Stakes, MD ERE:Bzmryzrx, Rojelio PARAS, NP  Brief Summary:  Patient is a 79 y.o.  female with history of CVA, COPD, HTN-who was brought to the ED after she was found by her roommate unresponsive (no heat/light-under a single blanket)-she was apparently covered in feces/dried blood-found to be hypothermic/hypotensive/hypoglycemic-she was thought to have undifferentiated shock-acute liver failure-acute kidney injury-admitted to the ICU-stabilized with pressors/D10 infusion/broad-spectrum antibiotics-rapidly improved and subsequently transferred to [facility].  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  Unfortunately-in spite of being on IV antibiotics-other supportive measures-an extended hospitalization-patient did not have any significant clinical improvement-oral intake continues to be erratic and mostly poor-palliative care was involved-after extensive discussion with patient's son-was transition to comfort measures on 12/29.  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 12/8>> ANA: Positive 12/8>> anti-smooth muscle antibody: Negative  Significant microbiology data: 12/6>> COVID/influenza/RSV PCR: Negative 12/6>> blood culture: No growth 12/6>> acute hepatitis serology: Negative 12/6>> CMV DNA PCR: Negative 12/9>> blood culture: No growth  Procedures: Line insertion 05/04/2024  Consults: PCCM GI ID Hematology [name]) Palliative care  Subjective:    She denies any complaints, she remains with poor appetite as discussed with staff    Objective: Vitals: Blood pressure 110/81, pulse 74, temperature 98.9 F (37.2 C), temperature source Axillary, resp. rate 17, height 5' 10 (1.778 m), weight 79.4 kg, SpO2 99%.   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  Due to thrombocytopenia-TEE was not pursued-however it was felt that since patient would be treated for 6 weeks-TEE would not change management.  PICC line was placed on 12/27.  Given ANA positive status-nonbacterial thrombotic endocarditis is also possible.   Unfortunately-no significant clinical improvement-has been followed by palliative care services closely-after discussion with patient's son-all antibiotics was discontinued 12/29-with focus mostly on comfort.   - After patient was transition to full comfort care she has remained clinically stable and in fact started showing some signs of improvement , overall she remains with poor appetite, not interactive with staff,  afebrile, no signs of active infection.   She remains DNR with plan being to continue gentle medical treatment and look for appropriate placement.  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   Treated with 2 doses of IVIG on 12/12 and 12/13 without any significant improvement-subsequently given Nplate  on 12/14 and 12/19-thankfully platelet counts now improved, but unfortunately trending down again, platelet count is 71 this morning  Normocytic anemia   Likely secondary to critical illness-no evidence of blood loss/GI bleeding Required 2 units of PRBC so far - Hemoglobin 7.7 this morning, no indication for transfusion  SVT on 12/15 now resting bradycardia with stable blood pressures Required adenosine  Currently stable off of medications, now is resting sinus bradycardia with stable blood pressures, heart rate in mid 50s, TSH free T4 stable.  EKG on 05/28/2024 stable with sinus bradycardia, rate 57 bpm.  Acute metabolic encephalopathy-likely superimposed on underlying dementia. Hospital delirium Acute metabolic encephalopathy initially felt to be secondary to possible sepsis physiology/liver failure-however continued to have waxing/waning mentation-even after treatment of underlying sepsis physiology/improvement in liver function- at this point is secondary to hospital-acquired delirium-probably superimposed on some amount of undiagnosed dementia.  Evaluated by psychiatry-does not have capacity Started on Remeron  and Seroquel  Has now been transition to full comfort measures.  Hypokalemia Replaced  Hypothermia Likely secondary to environmental exposure (per prior notes- cool room with single blanket-no heating or light) TSH stable Now normothermic after supportive care  Acute liver failure Unclear etiology-possibly shock liver from hypotension from possible sepsis  LFTs/coags improving Dopplers without portal/hepatic vein thrombosis Acute hepatitis serology  negative CMV IgM positive but viral load negative  Negative workup for autoimmune hepatitis completed N-acetylcysteine  12/10 Empirically on lactulose  Gastroenterology input appreciated  Oropharyngeal dysphagia Secondary to critical illness/encephalopathy SLP following-on dysphagia 2 diet.  AKI Hemodynamically mediated due to hypotension Resolved.  Hypokalemia/Hypophosphatemia Repleted  Metabolic acidosis Likely due to delayed clearance of lactic acid-in the setting of liver failure. Improved with supportive care  Self-limited episode-small-volume coffee-ground emesis Hb stable PPI By GI, resolved, no procedures.  Chest pain Likely atypical Resolved  Minimally elevated troponins Trend is flat Likely demand ischemia in the setting of hypotension/critical illness. Echo with stable EF  HTN BP stable, add  Norvasc  and monitor.  History of chronic HFpEF Status stable  History of CVA Baseline left hemiparesis Antiplatelets/statin held-in the setting of elevated liver enzymes and thrombocytopenia.  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    Pressure Ulcer: Agree with assessment and plan as outlined below Wound 04/14/24 0100 Pressure Injury Hip Left Stage 2 -  Partial thickness loss of dermis presenting as a shallow open injury with a red, pink wound bed without slough. (Active)     Wound 04/14/24 0100 Pressure Injury Buttocks Left;Right Unstageable - Full thickness tissue loss in which the base  of the injury is covered by slough (yellow, tan, gray, green or brown) and/or eschar (tan, brown or black) in the wound bed. (Active)     Wound 04/14/24 0100 Pressure Injury Sacrum Medial Stage 2 -  Partial thickness loss of dermis presenting as a shallow open injury with a red, pink wound bed without slough. (Active)    Code status:   Code Status: Limited: Do not attempt resuscitation (DNR) -DNR-LIMITED -Do Not Intubate/DNI  DVT Prophylaxis: SCDs Start: 04/13/24 2344    Family Communication: None at bedside.  Disposition Plan: Status is: Inpatient     Diet: Diet Order             DIET DYS 3 Room service appropriate? Yes; Fluid consistency: Thin  Diet effective now                     Data Review:   Patient Lines/Drains/Airways Status     Active Line/Drains/Airways     Name Placement date Placement time Site Days   PICC Single Lumen 05/04/24 Right Basilic 36 cm 0 cm 05/04/24  8676  Basilic  31   External Urinary Catheter 04/29/24  0553  --  36   Wound 04/14/24 0100 Pressure Injury Hip Left Stage 2 -  Partial thickness loss of dermis presenting as a shallow open injury with a red, pink wound bed without slough. 04/14/24  0100  Hip  51   Wound 04/14/24 0100 Pressure Injury Buttocks Left;Right Unstageable - Full thickness tissue loss in which the base of the injury is covered by slough (yellow, tan, gray, green or brown) and/or eschar (tan, brown or black) in the wound bed. 04/14/24  0100  Buttocks  51   Wound 04/14/24 0100 Pressure Injury Sacrum Medial Stage 2 -  Partial thickness loss of dermis presenting as a shallow open injury with a red, pink wound bed without slough. 04/14/24  0100  Sacrum  51             Inpatient Medications  Scheduled Meds:  amLODipine   10 mg Oral Daily   Chlorhexidine  Gluconate Cloth  6 each Topical Daily   feeding supplement  237 mL Oral TID BM   Gerhardt's butt cream   Topical BID   lactulose   20 g Oral BID   mirtazapine   30 mg  Oral QHS   mouth rinse  15 mL Mouth Rinse 4 times per day   pantoprazole   40 mg Oral BID   QUEtiapine   25 mg Oral BID   sodium chloride  flush  10-40 mL Intracatheter Q12H   Continuous Infusions:   PRN Meds:.bisacodyl , docusate, HYDROcodone -acetaminophen , ipratropium-albuterol , LORazepam , melatonin, ondansetron  (ZOFRAN ) IV, mouth rinse, polyethylene glycol, QUEtiapine , sodium chloride  flush  DVT Prophylaxis  SCDs Start: 04/13/24 2344   Recent Labs  Lab 05/31/24 0339  WBC 5.1  HGB 7.7*  HCT 24.2*  PLT 71*  MCV 100.8*  MCH 32.1  MCHC 31.8  RDW 16.6*    No results for input(s): NA, K, CL, CO2, ANIONGAP, GLUCOSE, BUN, CREATININE, AST, ALT, ALKPHOS, BILITOT, ALBUMIN , CRP, DDIMER, PROCALCITON, LATICACIDVEN, INR, TSH, CORTISOL, HGBA1C, AMMONIA, BNP, MG, PHOS, CALCIUM  in the last 168 hours.  Invalid input(s): GFRCGP      No results for input(s): CRP, DDIMER, PROCALCITON, LATICACIDVEN, INR, TSH, CORTISOL, HGBA1C, AMMONIA, BNP, MG, CALCIUM  in the last 168 hours.  Invalid input(s): PHOSPHOROUS    --------------------------------------------------------------------------------------------------------------- Lab Results  Component Value Date   CHOL 288 (H) 05/30/2023   HDL 78 05/30/2023   LDLCALC 165 (H) 05/30/2023   TRIG 248 (H) 05/30/2023   CHOLHDL 3.7 05/30/2023    Lab Results  Component Value Date   HGBA1C 5.3 09/11/2020   No results for input(s): TSH, T4TOTAL, FREET4, T3FREE, THYROIDAB in the last 72 hours.   No results for input(s): VITAMINB12, FOLATE, FERRITIN, TIBC, IRON, RETICCTPCT in the last 72 hours. ------------------------------------------------------------------------------------------------------------------ Cardiac Enzymes No results for input(s): CKMB, TROPONINI, MYOGLOBIN in the last 168 hours.  Invalid input(s): CK  Micro Results No  results found for this or any previous visit (from the past 240 hours).   Radiology Reports  No results found.    Signature  -   Brayton Lye M.D on 06/04/2024 at 2:26 PM   -  To page go to [email or web address]  "

## 2024-06-04 NOTE — Plan of Care (Signed)
  Problem: Clinical Measurements: Goal: Ability to maintain clinical measurements within normal limits will improve Outcome: Progressing Goal: Will remain free from infection Outcome: Progressing   Problem: Education: Goal: Knowledge of General Education information will improve Description: Including pain rating scale, medication(s)/side effects and non-pharmacologic comfort measures Outcome: Not Progressing   Problem: Health Behavior/Discharge Planning: Goal: Ability to manage health-related needs will improve Outcome: Not Progressing   

## 2024-06-04 NOTE — TOC Progression Note (Signed)
 Transition of Care [facility]) - Progression Note    Patient Details  Name: [name] MRN: [id number] Date of Birth: [birth year]  [facility] CM/SW Contact  [name], LCSW Phone Number: 06/04/2024, 7:13 AM  Clinical Narrative:    Continuing to await response from [facility].    Expected Discharge Plan: Skilled Nursing Facility Barriers to Discharge: SNF Pending bed offer, Inadequate or no insurance (APS obtaining guardianship; patient is over assets for LTC Medicaid)               Expected Discharge Plan and Services In-house Referral: Clinical Social Work   Post Acute Care Choice: Skilled Nursing Facility Living arrangements for the past 2 months: Single Family Home                                       Social Drivers of Health (SDOH) Interventions SDOH Screenings   Food Insecurity: Food Insecurity Present (04/15/2024)  Housing: High Risk (04/22/2024)  Transportation Needs: Unmet Transportation Needs (04/15/2024)  Utilities: At Risk (04/15/2024)  Depression (PHQ2-9): Low Risk (06/04/2021)  Social Connections: Unknown (04/15/2024)  Tobacco Use: Medium Risk (05/10/2024)    Readmission Risk Interventions    01/15/2024    2:24 PM  Readmission Risk Prevention Plan  Transportation Screening Complete  HRI or Home Care Consult Complete  Social Work Consult for Recovery Care Planning/Counseling Complete  Palliative Care Screening Not Applicable  Medication Review Oceanographer) Complete

## 2024-06-04 NOTE — Plan of Care (Signed)

## 2024-06-05 DIAGNOSIS — R410 Disorientation, unspecified: Secondary | ICD-10-CM | POA: Diagnosis not present

## 2024-06-05 DIAGNOSIS — L8990 Pressure ulcer of unspecified site, unspecified stage: Secondary | ICD-10-CM | POA: Diagnosis not present

## 2024-06-05 DIAGNOSIS — E43 Unspecified severe protein-calorie malnutrition: Secondary | ICD-10-CM | POA: Diagnosis not present

## 2024-06-05 DIAGNOSIS — K7200 Acute and subacute hepatic failure without coma: Secondary | ICD-10-CM | POA: Diagnosis not present

## 2024-06-05 NOTE — Plan of Care (Signed)

## 2024-06-05 NOTE — Progress Notes (Signed)
 "                        PROGRESS NOTE        PATIENT DETAILS Name: [name] Age: 79 y.o. Sex: female Date of Birth: February 14, 1946 Admit Date: 04/13/2024 Admitting Physician Tamela Stakes, MD ERE:Bzmryzrx, Rojelio PARAS, NP  Brief Summary:  Patient is a 79 y.o.  female with history of CVA, COPD, HTN-who was brought to the ED after she was found by her roommate unresponsive (no heat/light-under a single blanket)-she was apparently covered in feces/dried blood-found to be hypothermic/hypotensive/hypoglycemic-she was thought to have undifferentiated shock-acute liver failure-acute kidney injury-admitted to the ICU-stabilized with pressors/D10 infusion/broad-spectrum antibiotics-rapidly improved and subsequently transferred to [facility].  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  Unfortunately-in spite of being on IV antibiotics-other supportive measures-an extended hospitalization-patient did not have any significant clinical improvement-oral intake continues to be erratic and mostly poor-palliative care was involved-after extensive discussion with patient's son-was transition to comfort measures on 12/29.  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 12/8>> ANA: Positive 12/8>> anti-smooth muscle antibody: Negative  Significant microbiology data: 12/6>> COVID/influenza/RSV PCR: Negative 12/6>> blood culture: No growth 12/6>> acute hepatitis serology: Negative 12/6>> CMV DNA PCR: Negative 12/9>> blood culture: No growth  Procedures: Line insertion 05/04/2024  Consults: PCCM GI ID Hematology [name]) Palliative care  Subjective:   Lying comfortably in bed-no major issues overnight.  Objective: Vitals: Blood pressure (!) 113/53, pulse 74, temperature 98.5 F (36.9 C), temperature source Axillary, resp. rate 19, height 5' 10 (1.778 m), weight 79.4 kg, SpO2 99%.   Exam: Awake/alert Frail/deconditioned Unchanged left-sided hemiparesis.   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  Due to thrombocytopenia-TEE was not pursued-however it was felt that since patient would be treated for 6 weeks-TEE would not change management.  PICC line was placed on 12/27.  Given ANA positive status-nonbacterial thrombotic endocarditis is also possible.   Unfortunately-no significant clinical improvement-has been followed by palliative care services closely-after discussion with patient's son-all antibiotics was discontinued 12/29-with focus mostly on comfort.    After patient was transitioned to full comfort care she has remained clinically stable and in fact started showing some signs of improvement , overall she remains with poor appetite, not interactive with staff,  afebrile, no signs of active infection.  She remains DNR with plan being to continue gentle medical treatment and look for  appropriate placement.  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   Treated with 2 doses of IVIG on 12/12 and 12/13 without any significant improvement-subsequently given Nplate  on 12/14 and  12/19-thankfully platelet counts now improved, but unfortunately trending down again, platelet count is 71 this morning  Normocytic anemia   Likely secondary to critical illness-no evidence of blood loss/GI bleeding Required 2 units of PRBC so far Agree to follow CBC periodically.  SVT on 12/15 now resting bradycardia with stable blood pressures Required adenosine  Currently stable off of medications, now is resting sinus bradycardia with stable blood pressures, heart rate in mid 50s, TSH free T4 stable.  EKG on 05/28/2024 stable with sinus bradycardia, rate 57 bpm.  Acute metabolic encephalopathy-likely superimposed on underlying dementia. Hospital delirium Acute metabolic encephalopathy initially felt to be secondary to possible sepsis physiology/liver failure-however continued to have waxing/waning mentation-even after treatment of underlying sepsis physiology/improvement in liver function- at this point is secondary to hospital-acquired delirium-probably superimposed on some amount of undiagnosed dementia.  Evaluated by psychiatry-does not have capacity Started on Remeron  and Seroquel  Has now been transition to full comfort measures.  Hypokalemia Replaced  Hypothermia Likely secondary to environmental exposure (per prior notes- cool room with single blanket-no heating or light) TSH stable Now normothermic after supportive care  Acute liver failure Unclear etiology-possibly shock liver from hypotension from possible sepsis  LFTs/coags improving Dopplers without portal/hepatic vein thrombosis Acute hepatitis serology negative CMV IgM positive but viral load negative  Negative workup for autoimmune hepatitis completed  N-acetylcysteine  12/10 Empirically on lactulose  Gastroenterology input appreciated  Oropharyngeal dysphagia Secondary to critical illness/encephalopathy SLP following-on dysphagia 2 diet.  AKI Hemodynamically mediated due to hypotension Resolved.  Hypokalemia/Hypophosphatemia Repleted  Metabolic acidosis Likely due to delayed clearance of lactic acid-in the setting of liver failure. Improved with supportive care  Self-limited episode-small-volume coffee-ground emesis Hb stable PPI By GI, resolved, no procedures.  Chest pain Likely atypical Resolved  Minimally elevated troponins Trend is flat Likely demand ischemia in the setting of hypotension/critical illness. Echo with stable EF  HTN BP stable, add  Norvasc  and monitor.  History of chronic HFpEF Status stable  History of CVA Baseline left hemiparesis Antiplatelets/statin held-in the setting of elevated liver enzymes and thrombocytopenia.  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    Pressure Ulcer: Agree with assessment and plan as outlined below Wound 04/14/24 0100 Pressure Injury Hip Left Stage 2 -  Partial thickness loss of dermis presenting as a shallow open injury with a red, pink wound bed without slough. (Active)     Wound 04/14/24 0100 Pressure Injury Buttocks Left;Right Unstageable - Full thickness tissue loss in which the base of the injury is covered by slough (yellow, tan, gray, green or brown) and/or eschar (tan, brown or black)  in the wound bed. (Active)     Wound 04/14/24 0100 Pressure Injury Sacrum Medial Stage 2 -  Partial thickness loss of dermis presenting as a shallow open injury with a red, pink wound bed without slough. (Active)    Code status:   Code Status: Limited: Do not attempt resuscitation (DNR) -DNR-LIMITED -Do Not Intubate/DNI    DVT Prophylaxis: SCDs Start: 04/13/24 2344    Family Communication: None at bedside.  Disposition Plan: Status is: Inpatient     Diet: Diet Order  DIET DYS 3 Room service appropriate? Yes; Fluid consistency: Thin  Diet effective now                     Data Review:   Patient Lines/Drains/Airways Status     Active Line/Drains/Airways     Name Placement date Placement time Site Days   PICC Single Lumen 05/04/24 Right Basilic 36 cm 0 cm 05/04/24  8676  Basilic  32   External Urinary Catheter 04/29/24  0553  --  37   Wound 04/14/24 0100 Pressure Injury Hip Left Stage 2 -  Partial thickness loss of dermis presenting as a shallow open injury with a red, pink wound bed without slough. 04/14/24  0100  Hip  52   Wound 04/14/24 0100 Pressure Injury Buttocks Left;Right Unstageable - Full thickness tissue loss in which the base of the injury is covered by slough (yellow, tan, gray, green or brown) and/or eschar (tan, brown or black) in the wound bed. 04/14/24  0100  Buttocks  52   Wound 04/14/24 0100 Pressure Injury Sacrum Medial Stage 2 -  Partial thickness loss of dermis presenting as a shallow open injury with a red, pink wound bed without slough. 04/14/24  0100  Sacrum  52             Inpatient Medications  Scheduled Meds:  amLODipine   10 mg Oral Daily   Chlorhexidine  Gluconate Cloth  6 each Topical Daily   feeding supplement  237 mL Oral TID BM   Gerhardt's butt cream   Topical BID   lactulose   20 g Oral BID   mirtazapine   30 mg Oral QHS   mouth rinse  15 mL Mouth Rinse 4 times per day   pantoprazole   40 mg Oral BID   QUEtiapine    25 mg Oral BID   sodium chloride  flush  10-40 mL Intracatheter Q12H   Continuous Infusions:   PRN Meds:.bisacodyl , docusate, HYDROcodone -acetaminophen , ipratropium-albuterol , LORazepam , melatonin, ondansetron  (ZOFRAN ) IV, mouth rinse, polyethylene glycol, QUEtiapine , sodium chloride  flush  DVT Prophylaxis  SCDs Start: 04/13/24 2344   Recent Labs  Lab 05/31/24 0339  WBC 5.1  HGB 7.7*  HCT 24.2*  PLT 71*  MCV 100.8*  MCH 32.1  MCHC 31.8  RDW 16.6*    No results for input(s): NA, K, CL, CO2, ANIONGAP, GLUCOSE, BUN, CREATININE, AST, ALT, ALKPHOS, BILITOT, ALBUMIN , CRP, DDIMER, PROCALCITON, LATICACIDVEN, INR, TSH, CORTISOL, HGBA1C, AMMONIA, BNP, MG, PHOS, CALCIUM  in the last 168 hours.  Invalid input(s): GFRCGP      No results for input(s): CRP, DDIMER, PROCALCITON, LATICACIDVEN, INR, TSH, CORTISOL, HGBA1C, AMMONIA, BNP, MG, CALCIUM  in the last 168 hours.  Invalid input(s): PHOSPHOROUS    --------------------------------------------------------------------------------------------------------------- Lab Results  Component Value Date   CHOL 288 (H) 05/30/2023   HDL 78 05/30/2023   LDLCALC 165 (H) 05/30/2023   TRIG 248 (H) 05/30/2023   CHOLHDL 3.7 05/30/2023    Lab Results  Component Value Date   HGBA1C 5.3 09/11/2020   No results for input(s): TSH, T4TOTAL, FREET4, T3FREE, THYROIDAB in the last 72 hours.   No results for input(s): VITAMINB12, FOLATE, FERRITIN, TIBC, IRON, RETICCTPCT in the last 72 hours. ------------------------------------------------------------------------------------------------------------------ Cardiac Enzymes No results for input(s): CKMB, TROPONINI, MYOGLOBIN in the last 168 hours.  Invalid input(s): CK  Micro Results No results found for this or any previous visit (from the past 240 hours).   Radiology Reports  No results  found.    Signature  -   Hydrographic Surveyor  Kiran Carline M.D on 06/05/2024 at 10:11 AM   -  To page go to [email or web address]  "

## 2024-06-06 DIAGNOSIS — E43 Unspecified severe protein-calorie malnutrition: Secondary | ICD-10-CM | POA: Diagnosis not present

## 2024-06-06 DIAGNOSIS — R410 Disorientation, unspecified: Secondary | ICD-10-CM | POA: Diagnosis not present

## 2024-06-06 DIAGNOSIS — K7200 Acute and subacute hepatic failure without coma: Secondary | ICD-10-CM | POA: Diagnosis not present

## 2024-06-06 DIAGNOSIS — L8990 Pressure ulcer of unspecified site, unspecified stage: Secondary | ICD-10-CM | POA: Diagnosis not present

## 2024-06-06 NOTE — Plan of Care (Signed)
   Problem: Education: Goal: Knowledge of General Education information will improve Description Including pain rating scale, medication(s)/side effects and non-pharmacologic comfort measures Outcome: Progressing   Problem: Clinical Measurements: Goal: Will remain free from infection Outcome: Progressing Goal: Diagnostic test results will improve Outcome: Progressing Goal: Cardiovascular complication will be avoided Outcome: Progressing   Problem: Activity: Goal: Risk for activity intolerance will decrease Outcome: Progressing

## 2024-06-06 NOTE — TOC Progression Note (Signed)
 Transition of Care [facility]) - Progression Note    Patient Details  Name: [name] MRN: [id number] Date of Birth: 05/15/1945  [facility] CM/SW Contact  [name], LCSW Phone Number: 06/06/2024, 9:40 AM  Clinical Narrative:    Still waiting on a response from [facility]. No SNF bed offers available for LTC at this time. [facility] following.    Expected Discharge Plan: Skilled Nursing Facility Barriers to Discharge: SNF Pending bed offer, Inadequate or no insurance ([facility] obtaining guardianship; patient is over assets for LTC Medicaid)               Expected Discharge Plan and Services In-house Referral: Clinical Social Work   Post Acute Care Choice: Skilled Nursing Facility Living arrangements for the past 2 months: Single Family Home                                       Social Drivers of Health (SDOH) Interventions SDOH Screenings   Food Insecurity: Food Insecurity Present (04/15/2024)  Housing: High Risk (04/22/2024)  Transportation Needs: Unmet Transportation Needs (04/15/2024)  Utilities: At Risk (04/15/2024)  Depression (PHQ2-9): Low Risk (06/04/2021)  Social Connections: Unknown (04/15/2024)  Tobacco Use: Medium Risk (05/10/2024)    Readmission Risk Interventions    01/15/2024    2:24 PM  Readmission Risk Prevention Plan  Transportation Screening Complete  HRI or Home Care Consult Complete  Social Work Consult for Recovery Care Planning/Counseling Complete  Palliative Care Screening Not Applicable  Medication Review Oceanographer) Complete

## 2024-06-06 NOTE — Progress Notes (Signed)
 "                        PROGRESS NOTE        PATIENT DETAILS Name: [name] Age: 79 y.o. Sex: female Date of Birth: 1945-06-08 Admit Date: 04/13/2024 Admitting Physician Tamela Stakes, MD ERE:Bzmryzrx, Rojelio PARAS, NP  Brief Summary:  Patient is a 79 y.o.  female with history of CVA, COPD, HTN-who was brought to the ED after she was found by her roommate unresponsive (no heat/light-under a single blanket)-she was apparently covered in feces/dried blood-found to be hypothermic/hypotensive/hypoglycemic-she was thought to have undifferentiated shock-acute liver failure-acute kidney injury-admitted to the ICU-stabilized with pressors/D10 infusion/broad-spectrum antibiotics-rapidly improved and subsequently transferred to [facility].  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  Unfortunately-in spite of being on IV antibiotics-other supportive measures-an extended hospitalization-patient did not have any significant clinical improvement-oral intake continues to be erratic and mostly poor-palliative care was involved-after extensive discussion with patient's son-was transition to comfort measures on 12/29.  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 12/8>> ANA: Positive 12/8>> anti-smooth muscle antibody: Negative  Significant microbiology data: 12/6>> COVID/influenza/RSV PCR: Negative 12/6>> blood culture: No growth 12/6>> acute hepatitis serology: Negative 12/6>> CMV DNA PCR: Negative 12/9>> blood culture: No growth  Procedures: Line insertion 05/04/2024  Consults: PCCM GI ID Hematology [name]) Palliative care  Subjective:   No major issues overnight-lying comfortably in bed.  Objective: Vitals: Blood pressure (!) 113/53, pulse 69, temperature 98 F (36.7 C), temperature source Axillary, resp. rate 17, height 5' 10 (1.778 m), weight 79.4 kg, SpO2 99%.   Exam: Awake/alert Frail appearing Abdomen is soft Left-sided hemiparesis.   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  Due to thrombocytopenia-TEE was not pursued-however it was felt that since patient would be treated for 6 weeks-TEE would not change management.  PICC line was placed on 12/27.  Given ANA positive status-nonbacterial thrombotic endocarditis is also possible.   Unfortunately-no significant clinical improvement-has been followed by palliative care services closely-after discussion with patient's son-all antibiotics was discontinued 12/29-with focus mostly on comfort.   After patient was transitioned to full comfort care she has remained clinically stable and in fact started showing some signs of improvement , overall she remains with poor appetite, not interactive with staff,  afebrile, no signs of active infection.  She remains DNR with plan being to continue gentle medical treatment and look for appropriate  placement.  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   Treated with 2 doses of IVIG on 12/12 and 12/13 without any significant improvement-subsequently given Nplate  on 12/14  and 12/19-thankfully platelet counts now improved, but unfortunately trending down again, platelet count is 71 this morning  Normocytic anemia   Likely secondary to critical illness-no evidence of blood loss/GI bleeding Required 2 units of PRBC so far Agree to follow CBC periodically.  SVT on 12/15 now resting bradycardia with stable blood pressures Required adenosine  Currently stable off of medications, now is resting sinus bradycardia with stable blood pressures, heart rate in mid 50s, TSH free T4 stable.  EKG on 05/28/2024 stable with sinus bradycardia, rate 57 bpm.  Acute metabolic encephalopathy-likely superimposed on underlying dementia. Hospital delirium Acute metabolic encephalopathy initially felt to be secondary to possible sepsis physiology/liver failure-however continued to have waxing/waning mentation-even after treatment of underlying sepsis physiology/improvement in liver function- at this point is secondary to hospital-acquired delirium-probably superimposed on some amount of undiagnosed dementia.  Evaluated by psychiatry-does not have capacity Started on Remeron  and Seroquel  Has now been transition to full comfort measures.  Hypokalemia Replaced  Hypothermia Likely secondary to environmental exposure (per prior notes- cool room with single blanket-no heating or light) TSH stable Now normothermic after supportive care  Acute liver failure Unclear etiology-possibly shock liver from hypotension from possible sepsis  LFTs/coags improving Dopplers without portal/hepatic vein thrombosis Acute hepatitis serology negative CMV IgM positive but viral load negative  Negative workup for autoimmune hepatitis completed N-acetylcysteine   12/10 Empirically on lactulose  Gastroenterology input appreciated  Oropharyngeal dysphagia Secondary to critical illness/encephalopathy SLP following-on dysphagia 2 diet.  AKI Hemodynamically mediated due to hypotension Resolved.  Hypokalemia/Hypophosphatemia Repleted  Metabolic acidosis Likely due to delayed clearance of lactic acid-in the setting of liver failure. Improved with supportive care  Self-limited episode-small-volume coffee-ground emesis Hb stable PPI By GI, resolved, no procedures.  Chest pain Likely atypical Resolved  Minimally elevated troponins Trend is flat Likely demand ischemia in the setting of hypotension/critical illness. Echo with stable EF  HTN BP stable, add  Norvasc  and monitor.  History of chronic HFpEF Status stable  History of CVA Baseline left hemiparesis Antiplatelets/statin held-in the setting of elevated liver enzymes and thrombocytopenia.  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    Pressure Ulcer: Agree with assessment and plan as outlined below Wound 04/14/24 0100 Pressure Injury Hip Left Stage 2 -  Partial thickness loss of dermis presenting as a shallow open injury with a red, pink wound bed without slough. (Active)     Wound 04/14/24 0100 Pressure Injury Buttocks Left;Right Unstageable - Full thickness tissue loss in which the base of the injury is covered by slough (yellow, tan, gray, green or brown) and/or eschar (tan, brown or black) in the wound bed.  (Active)     Wound 04/14/24 0100 Pressure Injury Sacrum Medial Stage 2 -  Partial thickness loss of dermis presenting as a shallow open injury with a red, pink wound bed without slough. (Active)    Code status:   Code Status: Limited: Do not attempt resuscitation (DNR) -DNR-LIMITED -Do Not Intubate/DNI    DVT Prophylaxis: SCDs Start: 04/13/24 2344    Family Communication: None at bedside.  Disposition Plan: Status is: Inpatient     Diet: Diet Order  DIET DYS 3 Room service appropriate? Yes; Fluid consistency: Thin  Diet effective now                     Data Review:   Patient Lines/Drains/Airways Status     Active Line/Drains/Airways     Name Placement date Placement time Site Days   PICC Single Lumen 05/04/24 Right Basilic 36 cm 0 cm 05/04/24  8676  Basilic  33   External Urinary Catheter 04/29/24  0553  --  38   Wound 04/14/24 0100 Pressure Injury Hip Left Stage 2 -  Partial thickness loss of dermis presenting as a shallow open injury with a red, pink wound bed without slough. 04/14/24  0100  Hip  53   Wound 04/14/24 0100 Pressure Injury Buttocks Left;Right Unstageable - Full thickness tissue loss in which the base of the injury is covered by slough (yellow, tan, gray, green or brown) and/or eschar (tan, brown or black) in the wound bed. 04/14/24  0100  Buttocks  53   Wound 04/14/24 0100 Pressure Injury Sacrum Medial Stage 2 -  Partial thickness loss of dermis presenting as a shallow open injury with a red, pink wound bed without slough. 04/14/24  0100  Sacrum  53             Inpatient Medications  Scheduled Meds:  amLODipine   10 mg Oral Daily   Chlorhexidine  Gluconate Cloth  6 each Topical Daily   feeding supplement  237 mL Oral TID BM   Gerhardt's butt cream   Topical BID   lactulose   20 g Oral BID   mirtazapine   30 mg Oral QHS   mouth rinse  15 mL Mouth Rinse 4 times per day   pantoprazole   40 mg Oral BID   QUEtiapine   25 mg Oral BID    sodium chloride  flush  10-40 mL Intracatheter Q12H   Continuous Infusions:   PRN Meds:.bisacodyl , docusate, HYDROcodone -acetaminophen , ipratropium-albuterol , LORazepam , melatonin, ondansetron  (ZOFRAN ) IV, mouth rinse, polyethylene glycol, QUEtiapine , sodium chloride  flush  DVT Prophylaxis  SCDs Start: 04/13/24 2344   Recent Labs  Lab 05/31/24 0339  WBC 5.1  HGB 7.7*  HCT 24.2*  PLT 71*  MCV 100.8*  MCH 32.1  MCHC 31.8  RDW 16.6*    No results for input(s): NA, K, CL, CO2, ANIONGAP, GLUCOSE, BUN, CREATININE, AST, ALT, ALKPHOS, BILITOT, ALBUMIN , CRP, DDIMER, PROCALCITON, LATICACIDVEN, INR, TSH, CORTISOL, HGBA1C, AMMONIA, BNP, MG, PHOS, CALCIUM  in the last 168 hours.  Invalid input(s): GFRCGP      No results for input(s): CRP, DDIMER, PROCALCITON, LATICACIDVEN, INR, TSH, CORTISOL, HGBA1C, AMMONIA, BNP, MG, CALCIUM  in the last 168 hours.  Invalid input(s): PHOSPHOROUS    --------------------------------------------------------------------------------------------------------------- Lab Results  Component Value Date   CHOL 288 (H) 05/30/2023   HDL 78 05/30/2023   LDLCALC 165 (H) 05/30/2023   TRIG 248 (H) 05/30/2023   CHOLHDL 3.7 05/30/2023    Lab Results  Component Value Date   HGBA1C 5.3 09/11/2020   No results for input(s): TSH, T4TOTAL, FREET4, T3FREE, THYROIDAB in the last 72 hours.   No results for input(s): VITAMINB12, FOLATE, FERRITIN, TIBC, IRON, RETICCTPCT in the last 72 hours. ------------------------------------------------------------------------------------------------------------------ Cardiac Enzymes No results for input(s): CKMB, TROPONINI, MYOGLOBIN in the last 168 hours.  Invalid input(s): CK  Micro Results No results found for this or any previous visit (from the past 240 hours).   Radiology Reports  No results  found.    Signature  -   Hydrographic Surveyor  Jerzie Bieri M.D on 06/06/2024 at 11:26 AM   -  To page go to [email or web address]  "

## 2024-06-07 DIAGNOSIS — R410 Disorientation, unspecified: Secondary | ICD-10-CM | POA: Diagnosis not present

## 2024-06-07 DIAGNOSIS — E43 Unspecified severe protein-calorie malnutrition: Secondary | ICD-10-CM | POA: Diagnosis not present

## 2024-06-07 DIAGNOSIS — K7200 Acute and subacute hepatic failure without coma: Secondary | ICD-10-CM | POA: Diagnosis not present

## 2024-06-07 DIAGNOSIS — L8990 Pressure ulcer of unspecified site, unspecified stage: Secondary | ICD-10-CM | POA: Diagnosis not present

## 2024-06-07 NOTE — TOC Progression Note (Signed)
 Transition of Care [facility]) - Progression Note    Patient Details  Name: [name] MRN: [id number] Date of Birth: 03-14-1946  Transition of Care [facility]) CM/SW Contact  [name], LCSW Phone Number: 06/07/2024, 2:29 PM  Clinical Narrative:    10am-[place] with [facility] requesting a 30 day LOG pending LTC Medicaid if they choose to accept patient. CSW consulting with ICM Supervisor.   12pm-Per Augustin Planas with [facility] declined patient due to patient receiving Ativan  on the 17th.   2:31 PM-CSW discussed case with Augustin and am awaiting a call back from [place] at [facility] to clarify reason for denial as patient has not received IV Ativan  since 1/1 and Haldol  since December. She does not have any restraints or sitter and per RN, no behaviors.      Expected Discharge Plan: Skilled Nursing Facility Barriers to Discharge: SNF Pending bed offer, Inadequate or no insurance (APS obtaining guardianship; patient is over assets for LTC Medicaid)               Expected Discharge Plan and Services In-house Referral: Clinical Social Work   Post Acute Care Choice: Skilled Nursing Facility Living arrangements for the past 2 months: Single Family Home                                       Social Drivers of Health (SDOH) Interventions SDOH Screenings   Food Insecurity: Food Insecurity Present (04/15/2024)  Housing: High Risk (04/22/2024)  Transportation Needs: Unmet Transportation Needs (04/15/2024)  Utilities: At Risk (04/15/2024)  Depression (PHQ2-9): Low Risk (06/04/2021)  Social Connections: Unknown (04/15/2024)  Tobacco Use: Medium Risk (05/10/2024)    Readmission Risk Interventions    01/15/2024    2:24 PM  Readmission Risk Prevention Plan  Transportation Screening Complete  HRI or Home Care Consult Complete  Social Work Consult for Recovery Care Planning/Counseling Complete  Palliative Care Screening Not Applicable  Medication Review Furniture Conservator/restorer) Complete

## 2024-06-07 NOTE — Progress Notes (Signed)
 "                        PROGRESS NOTE        PATIENT DETAILS Name: [name] Age: 79 y.o. Sex: female Date of Birth: Dec 03, 1945 Admit Date: 04/13/2024 Admitting Physician Tamela Stakes, MD ERE:Bzmryzrx, Rojelio PARAS, NP  Brief Summary:  Patient is a 79 y.o.  female with history of CVA, COPD, HTN-who was brought to the ED after she was found by her roommate unresponsive (no heat/light-under a single blanket)-she was apparently covered in feces/dried blood-found to be hypothermic/hypotensive/hypoglycemic-she was thought to have undifferentiated shock-acute liver failure-acute kidney injury-admitted to the ICU-stabilized with pressors/D10 infusion/broad-spectrum antibiotics-rapidly improved and subsequently transferred to [facility].  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  Unfortunately-in spite of being on IV antibiotics-other supportive measures-an extended hospitalization-patient did not have any significant clinical improvement-oral intake continues to be erratic and mostly poor-palliative care was involved-after extensive discussion with patient's son-was transition to comfort measures on 12/29.  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 12/8>> ANA: Positive 12/8>> anti-smooth muscle antibody: Negative  Significant microbiology data: 12/6>> COVID/influenza/RSV PCR: Negative 12/6>> blood culture: No growth 12/6>> acute hepatitis serology: Negative 12/6>> CMV DNA PCR: Negative 12/9>> blood culture: No growth  Procedures: Line insertion 05/04/2024  Consults: PCCM GI ID Hematology [name]) Palliative care  Subjective:   No major issues overnight-lying comfortably in bed.  Objective: Vitals: Blood pressure (!) 122/57, pulse 66, temperature 98.6 F (37 C), temperature source Axillary, resp. rate 17, height 5' 10 (1.778 m), weight 79.4 kg, SpO2 99%.   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  Due to thrombocytopenia-TEE was not pursued-however it was felt that since patient would be treated for 6 weeks-TEE would not change management.  PICC line was placed on 12/27.  Given ANA positive status-nonbacterial thrombotic endocarditis is also possible.   Unfortunately-no significant clinical improvement-has been followed by palliative care services closely-after discussion with patient's son-all antibiotics was discontinued 12/29-with focus mostly on comfort.   After patient was transitioned to full comfort care she has remained clinically stable and in fact started showing some signs of improvement , overall she remains with poor appetite, not interactive with staff,  afebrile, no signs of active infection.  She remains DNR with plan being to continue gentle  medical treatment and look for appropriate placement.  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   Treated with 2 doses of IVIG on 12/12 and 12/13 without any significant improvement-subsequently  given Nplate  on 12/14 and 12/19-thankfully platelet counts now improved, but unfortunately trending down again, platelet count is 71 this morning  Normocytic anemia   Likely secondary to critical illness-no evidence of blood loss/GI bleeding Required 2 units of PRBC so far Agree to follow CBC periodically.  SVT on 12/15 now resting bradycardia with stable blood pressures Required adenosine  Currently stable off of medications, now is resting sinus bradycardia with stable blood pressures, heart rate in mid 50s, TSH free T4 stable.  EKG on 05/28/2024 stable with sinus bradycardia, rate 57 bpm.  Acute metabolic encephalopathy-likely superimposed on underlying dementia. Hospital delirium Acute metabolic encephalopathy initially felt to be secondary to possible sepsis physiology/liver failure-however continued to have waxing/waning mentation-even after treatment of underlying sepsis physiology/improvement in liver function- at this point is secondary to hospital-acquired delirium-probably superimposed on some amount of undiagnosed dementia.  Evaluated by psychiatry-does not have capacity Started on Remeron  and Seroquel  Has now been transition to full comfort measures.  Hypokalemia Replaced  Hypothermia Likely secondary to environmental exposure (per prior notes- cool room with single blanket-no heating or light) TSH stable Now normothermic after supportive care  Acute liver failure Unclear etiology-possibly shock liver from hypotension from possible sepsis  LFTs/coags improving Dopplers without portal/hepatic vein thrombosis Acute hepatitis serology negative CMV IgM positive but viral load negative  Negative workup for autoimmune  hepatitis completed N-acetylcysteine  12/10 Empirically on lactulose  Gastroenterology input appreciated  Oropharyngeal dysphagia Secondary to critical illness/encephalopathy SLP following-on dysphagia 2 diet.  AKI Hemodynamically mediated due to hypotension Resolved.  Hypokalemia/Hypophosphatemia Repleted  Metabolic acidosis Likely due to delayed clearance of lactic acid-in the setting of liver failure. Improved with supportive care  Self-limited episode-small-volume coffee-ground emesis Hb stable PPI By GI, resolved, no procedures.  Chest pain Likely atypical Resolved  Minimally elevated troponins Trend is flat Likely demand ischemia in the setting of hypotension/critical illness. Echo with stable EF  HTN BP stable, add  Norvasc  and monitor.  History of chronic HFpEF Status stable  History of CVA Baseline left hemiparesis Antiplatelets/statin held-in the setting of elevated liver enzymes and thrombocytopenia.  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    Pressure Ulcer: Agree with assessment and plan as outlined below Wound 04/14/24 0100 Pressure Injury Hip Left Stage 2 -  Partial thickness loss of dermis presenting as a shallow open injury with a red, pink wound bed without slough. (Active)     Wound 04/14/24 0100 Pressure Injury Buttocks Left;Right Unstageable - Full thickness tissue loss in which the base of the injury is covered by slough (yellow, tan, gray, green or brown) and/or eschar  (tan, brown or black) in the wound bed. (Active)     Wound 04/14/24 0100 Pressure Injury Sacrum Medial Stage 2 -  Partial thickness loss of dermis presenting as a shallow open injury with a red, pink wound bed without slough. (Active)    Code status:   Code Status: Limited: Do not attempt resuscitation (DNR) -DNR-LIMITED -Do Not Intubate/DNI    DVT Prophylaxis: SCDs Start: 04/13/24 2344    Family Communication: None at bedside.  Disposition Plan: Status is: Inpatient  Diet: Diet Order             DIET DYS 3 Room service appropriate? Yes; Fluid consistency: Thin  Diet effective now                     Data Review:   Patient Lines/Drains/Airways Status     Active Line/Drains/Airways     Name Placement date Placement time Site Days   PICC Single Lumen 05/04/24 Right Basilic 36 cm 0 cm 05/04/24  8676  Basilic  34   External Urinary Catheter 04/29/24  0553  --  39   Wound 04/14/24 0100 Pressure Injury Hip Left Stage 2 -  Partial thickness loss of dermis presenting as a shallow open injury with a red, pink wound bed without slough. 04/14/24  0100  Hip  54   Wound 04/14/24 0100 Pressure Injury Buttocks Left;Right Unstageable - Full thickness tissue loss in which the base of the injury is covered by slough (yellow, tan, gray, green or brown) and/or eschar (tan, brown or black) in the wound bed. 04/14/24  0100  Buttocks  54   Wound 04/14/24 0100 Pressure Injury Sacrum Medial Stage 2 -  Partial thickness loss of dermis presenting as a shallow open injury with a red, pink wound bed without slough. 04/14/24  0100  Sacrum  54             Inpatient Medications  Scheduled Meds:  amLODipine   10 mg Oral Daily   Chlorhexidine  Gluconate Cloth  6 each Topical Daily   feeding supplement  237 mL Oral TID BM   Gerhardt's butt cream   Topical BID   lactulose   20 g Oral BID   mirtazapine   30 mg Oral QHS   mouth rinse  15 mL Mouth Rinse 4 times per day   pantoprazole   40 mg  Oral BID   QUEtiapine   25 mg Oral BID   sodium chloride  flush  10-40 mL Intracatheter Q12H   Continuous Infusions:   PRN Meds:.bisacodyl , docusate, HYDROcodone -acetaminophen , ipratropium-albuterol , LORazepam , melatonin, ondansetron  (ZOFRAN ) IV, mouth rinse, polyethylene glycol, QUEtiapine , sodium chloride  flush  DVT Prophylaxis  SCDs Start: 04/13/24 2344   No results for input(s): WBC, HGB, HCT, PLT, MCV, MCH, MCHC, RDW, LYMPHSABS, MONOABS, EOSABS, BASOSABS, BANDABS in the last 168 hours.  Invalid input(s): NEUTRABS, BANDSABD   No results for input(s): NA, K, CL, CO2, ANIONGAP, GLUCOSE, BUN, CREATININE, AST, ALT, ALKPHOS, BILITOT, ALBUMIN , CRP, DDIMER, PROCALCITON, LATICACIDVEN, INR, TSH, CORTISOL, HGBA1C, AMMONIA, BNP, MG, PHOS, CALCIUM  in the last 168 hours.  Invalid input(s): GFRCGP      No results for input(s): CRP, DDIMER, PROCALCITON, LATICACIDVEN, INR, TSH, CORTISOL, HGBA1C, AMMONIA, BNP, MG, CALCIUM  in the last 168 hours.  Invalid input(s): PHOSPHOROUS    --------------------------------------------------------------------------------------------------------------- Lab Results  Component Value Date   CHOL 288 (H) 05/30/2023   HDL 78 05/30/2023   LDLCALC 165 (H) 05/30/2023   TRIG 248 (H) 05/30/2023   CHOLHDL 3.7 05/30/2023    Lab Results  Component Value Date   HGBA1C 5.3 09/11/2020   No results for input(s): TSH, T4TOTAL, FREET4, T3FREE, THYROIDAB in the last 72 hours.   No results for input(s): VITAMINB12, FOLATE, FERRITIN, TIBC, IRON, RETICCTPCT in the last 72 hours. ------------------------------------------------------------------------------------------------------------------ Cardiac Enzymes No results for input(s): CKMB, TROPONINI, MYOGLOBIN in the last 168 hours.  Invalid input(s): CK  Micro Results No  results found for this or any previous visit (from the past 240 hours).   Radiology Reports  No  results found.    Signature  -   Donalda Applebaum M.D on 06/07/2024 at 11:52 AM   -  To page go to [email or web address]  "

## 2024-06-07 NOTE — Plan of Care (Signed)
°  Problem: Clinical Measurements: Goal: Will remain free from infection Outcome: Progressing   Problem: Activity: Goal: Risk for activity intolerance will decrease Outcome: Progressing   Problem: Nutrition: Goal: Adequate nutrition will be maintained Outcome: Progressing

## 2024-06-08 DIAGNOSIS — L8990 Pressure ulcer of unspecified site, unspecified stage: Secondary | ICD-10-CM | POA: Diagnosis not present

## 2024-06-08 DIAGNOSIS — K7200 Acute and subacute hepatic failure without coma: Secondary | ICD-10-CM | POA: Diagnosis not present

## 2024-06-08 DIAGNOSIS — R410 Disorientation, unspecified: Secondary | ICD-10-CM | POA: Diagnosis not present

## 2024-06-08 DIAGNOSIS — E43 Unspecified severe protein-calorie malnutrition: Secondary | ICD-10-CM | POA: Diagnosis not present

## 2024-06-08 MED ORDER — FENTANYL CITRATE (PF) 50 MCG/ML IJ SOSY
12.5000 ug | PREFILLED_SYRINGE | Freq: Four times a day (QID) | INTRAMUSCULAR | Status: AC | PRN
  Administered 2024-06-08: 12.5 ug via INTRAVENOUS
  Filled 2024-06-08: qty 1

## 2024-06-08 MED ORDER — HYDROCODONE-ACETAMINOPHEN 5-325 MG PO TABS
1.0000 | ORAL_TABLET | Freq: Three times a day (TID) | ORAL | Status: AC | PRN
  Administered 2024-06-08 – 2024-06-10 (×3): 2 via ORAL
  Administered 2024-06-11 – 2024-06-12 (×3): 1 via ORAL
  Filled 2024-06-08 (×4): qty 2
  Filled 2024-06-08 (×3): qty 1

## 2024-06-08 NOTE — Progress Notes (Signed)
 "                        PROGRESS NOTE        PATIENT DETAILS Name: [name] Age: 79 y.o. Sex: female Date of Birth: Jul 01, 1945 Admit Date: 04/13/2024 Admitting Physician Tamela Stakes, MD ERE:Bzmryzrx, Rojelio PARAS, NP  Brief Summary:  Patient is a 79 y.o.  female with history of CVA, COPD, HTN-who was brought to the ED after she was found by her roommate unresponsive (no heat/light-under a single blanket)-she was apparently covered in feces/dried blood-found to be hypothermic/hypotensive/hypoglycemic-she was thought to have undifferentiated shock-acute liver failure-acute kidney injury-admitted to the ICU-stabilized with pressors/D10 infusion/broad-spectrum antibiotics-rapidly improved and subsequently transferred to [facility].  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  Unfortunately-in spite of being on IV antibiotics-other supportive measures-an extended hospitalization-patient did not have any significant clinical improvement-oral intake continues to be erratic and mostly poor-palliative care was involved-after extensive discussion with patient's son-was transition to comfort measures on 12/29.  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 12/8>> ANA: Positive 12/8>> anti-smooth muscle antibody: Negative  Significant microbiology data: 12/6>> COVID/influenza/RSV PCR: Negative 12/6>> blood culture: No growth 12/6>> acute hepatitis serology: Negative 12/6>> CMV DNA PCR: Negative 12/9>> blood culture: No growth  Procedures: Line insertion 05/04/2024  Consults: PCCM GI ID Hematology [name]) Palliative care  Subjective:   Lying comfortably in bed-no major issues overnight.  Objective: Vitals: Blood pressure (!) 125/59, pulse 66, temperature 99.2 F (37.3 C), temperature source Axillary, resp. rate 17, height 5' 10 (1.778 m), weight 79.4 kg, SpO2 99%.   Exam: Awake/alert Very frail appearing Abdomen: Soft nontender nondistended Unchanged left-sided hemiparesis.  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  Due to thrombocytopenia-TEE was not pursued-however it was felt that since patient would be treated for 6 weeks-TEE would not change management.  PICC line was placed on 12/27.  Given ANA positive status-nonbacterial thrombotic endocarditis is also possible.   Unfortunately-no significant clinical improvement-has been followed by palliative care services closely-after discussion with patient's son-all antibiotics was discontinued 12/29-with focus mostly on comfort.   After patient was transitioned to full comfort care she has remained clinically stable and in fact started showing some signs of improvement , overall she remains with poor appetite, not interactive with staff,  afebrile, no signs of active infection.  She remains DNR with plan being to continue gentle medical  treatment and look for appropriate placement.  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   Treated with 2 doses of IVIG on 12/12 and 12/13 without any significant improvement-subsequently given Nplate   on 12/14 and 12/19-thankfully platelet counts now improved, but unfortunately trending down again, platelet count is 71 this morning  Normocytic anemia   Likely secondary to critical illness-no evidence of blood loss/GI bleeding Required 2 units of PRBC so far Agree to follow CBC periodically.  SVT on 12/15 now resting bradycardia with stable blood pressures Required adenosine  Currently stable off of medications, now is resting sinus bradycardia with stable blood pressures, heart rate in mid 50s, TSH free T4 stable.  EKG on 05/28/2024 stable with sinus bradycardia, rate 57 bpm.  Acute metabolic encephalopathy-likely superimposed on underlying dementia. Hospital delirium Acute metabolic encephalopathy initially felt to be secondary to possible sepsis physiology/liver failure-however continued to have waxing/waning mentation-even after treatment of underlying sepsis physiology/improvement in liver function- at this point is secondary to hospital-acquired delirium-probably superimposed on some amount of undiagnosed dementia.  Evaluated by psychiatry-does not have capacity Started on Remeron  and Seroquel  Has now been transition to full comfort measures.  Hypokalemia Replaced  Hypothermia Likely secondary to environmental exposure (per prior notes- cool room with single blanket-no heating or light) TSH stable Now normothermic after supportive care  Acute liver failure Unclear etiology-possibly shock liver from hypotension from possible sepsis  LFTs/coags improving Dopplers without portal/hepatic vein thrombosis Acute hepatitis serology negative CMV IgM positive but viral load negative  Negative workup for autoimmune  hepatitis completed N-acetylcysteine  12/10 Empirically on lactulose  Gastroenterology input appreciated  Oropharyngeal dysphagia Secondary to critical illness/encephalopathy SLP following-on dysphagia 2 diet.  AKI Hemodynamically mediated due to hypotension Resolved.  Hypokalemia/Hypophosphatemia Repleted  Metabolic acidosis Likely due to delayed clearance of lactic acid-in the setting of liver failure. Improved with supportive care  Self-limited episode-small-volume coffee-ground emesis Hb stable PPI By GI, resolved, no procedures.  Chest pain Likely atypical Resolved  Minimally elevated troponins Trend is flat Likely demand ischemia in the setting of hypotension/critical illness. Echo with stable EF  HTN BP stable, add  Norvasc  and monitor.  History of chronic HFpEF Status stable  History of CVA Baseline left hemiparesis Antiplatelets/statin held-in the setting of elevated liver enzymes and thrombocytopenia.  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    Pressure Ulcer: Agree with assessment and plan as outlined below Wound 04/14/24 0100 Pressure Injury Hip Left Stage 2 -  Partial thickness loss of dermis presenting as a shallow open injury with a red, pink wound bed without slough. (Active)     Wound 04/14/24 0100 Pressure Injury Buttocks Left;Right Unstageable - Full thickness tissue loss in which the base of the injury is covered by slough (yellow, tan, gray, green or brown) and/or eschar  (tan, brown or black) in the wound bed. (Active)     Wound 04/14/24 0100 Pressure Injury Sacrum Medial Stage 2 -  Partial thickness loss of dermis presenting as a shallow open injury with a red, pink wound bed without slough. (Active)    Code status:   Code Status: Limited: Do not attempt resuscitation (DNR) -DNR-LIMITED -Do Not Intubate/DNI    DVT Prophylaxis: SCDs Start: 04/13/24 2344    Family Communication: None at bedside.  Disposition Plan: Status is: Inpatient     Diet:  Diet Order             DIET DYS 3 Room service appropriate? Yes; Fluid consistency: Thin  Diet effective now                     Data Review:   Patient Lines/Drains/Airways Status     Active Line/Drains/Airways     Name Placement date Placement time Site Days   PICC Single Lumen 05/04/24 Right Basilic 36 cm 0 cm 05/04/24  8676  Basilic  35   External Urinary Catheter 04/29/24  0553  --  40   Wound 04/14/24 0100 Pressure Injury Hip Left Stage 2 -  Partial thickness loss of dermis presenting as a shallow open injury with a red, pink wound bed without slough. 04/14/24  0100  Hip  55   Wound 04/14/24 0100 Pressure Injury Buttocks Left;Right Unstageable - Full thickness tissue loss in which the base of the injury is covered by slough (yellow, tan, gray, green or brown) and/or eschar (tan, brown or black) in the wound bed. 04/14/24  0100  Buttocks  55   Wound 04/14/24 0100 Pressure Injury Sacrum Medial Stage 2 -  Partial thickness loss of dermis presenting as a shallow open injury with a red, pink wound bed without slough. 04/14/24  0100  Sacrum  55             Inpatient Medications  Scheduled Meds:  amLODipine   10 mg Oral Daily   Chlorhexidine  Gluconate Cloth  6 each Topical Daily   feeding supplement  237 mL Oral TID BM   Gerhardt's butt cream   Topical BID   lactulose   20 g Oral BID   mirtazapine   30 mg Oral QHS   mouth rinse  15 mL Mouth Rinse 4 times per day   pantoprazole   40 mg  Oral BID   QUEtiapine   25 mg Oral BID   sodium chloride  flush  10-40 mL Intracatheter Q12H   Continuous Infusions:   PRN Meds:.bisacodyl , docusate, HYDROcodone -acetaminophen , ipratropium-albuterol , LORazepam , melatonin, ondansetron  (ZOFRAN ) IV, mouth rinse, polyethylene glycol, QUEtiapine , sodium chloride  flush  DVT Prophylaxis  SCDs Start: 04/13/24 2344   No results for input(s): WBC, HGB, HCT, PLT, MCV, MCH, MCHC, RDW, LYMPHSABS, MONOABS, EOSABS, BASOSABS, BANDABS in the last 168 hours.  Invalid input(s): NEUTRABS, BANDSABD   No results for input(s): NA, K, CL, CO2, ANIONGAP, GLUCOSE, BUN, CREATININE, AST, ALT, ALKPHOS, BILITOT, ALBUMIN , CRP, DDIMER, PROCALCITON, LATICACIDVEN, INR, TSH, CORTISOL, HGBA1C, AMMONIA, BNP, MG, PHOS, CALCIUM  in the last 168 hours.  Invalid input(s): GFRCGP      No results for input(s): CRP, DDIMER, PROCALCITON, LATICACIDVEN, INR, TSH, CORTISOL, HGBA1C, AMMONIA, BNP, MG, CALCIUM  in the last 168 hours.  Invalid input(s): PHOSPHOROUS    --------------------------------------------------------------------------------------------------------------- Lab Results  Component Value Date   CHOL 288 (H) 05/30/2023   HDL 78 05/30/2023   LDLCALC 165 (H) 05/30/2023   TRIG 248 (H) 05/30/2023   CHOLHDL 3.7 05/30/2023    Lab Results  Component Value Date   HGBA1C 5.3 09/11/2020   No results for input(s): TSH, T4TOTAL, FREET4, T3FREE, THYROIDAB in the last 72 hours.   No results for input(s): VITAMINB12, FOLATE, FERRITIN, TIBC, IRON, RETICCTPCT in the last 72 hours. ------------------------------------------------------------------------------------------------------------------ Cardiac Enzymes No results for input(s): CKMB, TROPONINI, MYOGLOBIN in the last 168 hours.  Invalid input(s): CK  Micro Results No  results found for this or any previous visit (from the past 240 hours).   Radiology Reports  No results  found.    Signature  -   Donalda Applebaum M.D on 06/08/2024 at 10:56 AM   -  To page go to [email or web address]  "

## 2024-06-08 NOTE — Plan of Care (Signed)

## 2024-06-08 NOTE — Plan of Care (Signed)
  Problem: Education: Goal: Knowledge of General Education information will improve Description: Including pain rating scale, medication(s)/side effects and non-pharmacologic comfort measures Outcome: Progressing   Problem: Activity: Goal: Risk for activity intolerance will decrease Outcome: Progressing   Problem: Coping: Goal: Level of anxiety will decrease Outcome: Progressing   Problem: Pain Managment: Goal: General experience of comfort will improve and/or be controlled Outcome: Progressing   Problem: Skin Integrity: Goal: Risk for impaired skin integrity will decrease Outcome: Progressing

## 2024-06-09 DIAGNOSIS — E43 Unspecified severe protein-calorie malnutrition: Secondary | ICD-10-CM | POA: Diagnosis not present

## 2024-06-09 DIAGNOSIS — K7200 Acute and subacute hepatic failure without coma: Secondary | ICD-10-CM | POA: Diagnosis not present

## 2024-06-09 DIAGNOSIS — L8990 Pressure ulcer of unspecified site, unspecified stage: Secondary | ICD-10-CM | POA: Diagnosis not present

## 2024-06-09 DIAGNOSIS — R410 Disorientation, unspecified: Secondary | ICD-10-CM | POA: Diagnosis not present

## 2024-06-09 MED ORDER — SENNOSIDES-DOCUSATE SODIUM 8.6-50 MG PO TABS
2.0000 | ORAL_TABLET | Freq: Every evening | ORAL | Status: DC | PRN
  Administered 2024-06-10: 2 via ORAL
  Filled 2024-06-09: qty 2

## 2024-06-09 MED ORDER — POLYETHYLENE GLYCOL 3350 17 G PO PACK
17.0000 g | PACK | Freq: Every day | ORAL | Status: DC
  Filled 2024-06-09 (×2): qty 1

## 2024-06-09 NOTE — Plan of Care (Signed)
  Problem: Pain Managment: Goal: General experience of comfort will improve and/or be controlled Outcome: Progressing   Problem: Safety: Goal: Ability to remain free from injury will improve Outcome: Progressing

## 2024-06-09 NOTE — Progress Notes (Signed)
 "                        PROGRESS NOTE        PATIENT DETAILS Name: [name] Age: 79 y.o. Sex: female Date of Birth: [birth year] Admit Date: 04/13/2024 Admitting Physician Tamela Stakes, MD ERE:Bzmryzrx, Rojelio PARAS, NP  Brief Summary:  Patient is a 79 y.o.  female with history of CVA, COPD, HTN-who was brought to the ED after she was found by her roommate unresponsive (no heat/light-under a single blanket)-she was apparently covered in feces/dried blood-found to be hypothermic/hypotensive/hypoglycemic-she was thought to have undifferentiated shock-acute liver failure-acute kidney injury-admitted to the ICU-stabilized with pressors/D10 infusion/broad-spectrum antibiotics-rapidly improved and subsequently transferred to [facility].  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  Unfortunately-in spite of being on IV antibiotics-other supportive measures-an extended hospitalization-patient did not have any significant clinical improvement-oral intake continues to be erratic and mostly poor-palliative care was involved-after extensive discussion with patient's son-was transition to comfort measures on 12/29.  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 12/8>> ANA: Positive 12/8>> anti-smooth muscle antibody: Negative  Significant microbiology data: 12/6>> COVID/influenza/RSV PCR: Negative 12/6>> blood culture: No growth 12/6>> acute hepatitis serology: Negative 12/6>> CMV DNA PCR: Negative 12/9>> blood culture: No growth  Procedures: Line insertion 05/04/2024  Consults: P[facility] GI ID Hematology [name]) Palliative care  Subjective:    Comfortably in bed-no major issues overnight.  Per nursing staff-oral intake is erratic but mostly poor.  Mostly drinking ensures.  Objective: Vitals: Blood pressure 101/68, pulse 67, temperature 98 F (36.7 C), temperature source Axillary, resp. rate 19, height 5' 10 (1.778 m), weight 79.4 kg, SpO2 100%.   Exam: Sleeping comfortably-awake/alert-very frail appearing Unchanged left-sided hemiparesis Abdomen: Soft nontender nondistended.  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  Due to thrombocytopenia-TEE was not pursued-however it was felt that since patient would be treated for 6 weeks-TEE would not change management.  PICC line was placed on 12/27.  Given ANA positive status-nonbacterial thrombotic endocarditis is also possible.   Unfortunately-no significant clinical improvement-has been followed by palliative care services closely-after discussion with patient's son-all antibiotics was discontinued 12/29-with focus mostly on comfort.   After patient was transitioned to full comfort care she has remained clinically stable and in fact started showing some signs of improvement , overall she remains with poor appetite, not interactive with staff,   afebrile, no signs of active infection.  She remains DNR with plan being to continue gentle medical treatment and look for appropriate placement.  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   Treated with 2  doses of IVIG on 12/12 and 12/13 without any significant improvement-subsequently given Nplate  on 12/14 and 12/19-thankfully platelet counts now improved, but unfortunately trending down again, platelet count is 71 this morning  Normocytic anemia   Likely secondary to critical illness-no evidence of blood loss/GI bleeding Required 2 units of PRBC so far Agree to follow CBC periodically.  SVT on 12/15 now resting bradycardia with stable blood pressures Required adenosine  Currently stable off of medications, now is resting sinus bradycardia with stable blood pressures, heart rate in mid 50s, TSH free T4 stable.  EKG on 05/28/2024 stable with sinus bradycardia, rate 57 bpm.  Acute metabolic encephalopathy-likely superimposed on underlying dementia. Hospital delirium Acute metabolic encephalopathy initially felt to be secondary to possible sepsis physiology/liver failure-however continued to have waxing/waning mentation-even after treatment of underlying sepsis physiology/improvement in liver function- at this point is secondary to hospital-acquired delirium-probably superimposed on some amount of undiagnosed dementia.  Evaluated by psychiatry-does not have capacity Started on Remeron  and Seroquel  Has now been transition to full comfort measures.  Hypokalemia Replaced  Hypothermia Likely secondary to environmental exposure (per prior notes- cool room with single blanket-no heating or light) TSH stable Now normothermic after supportive care  Acute liver failure Unclear etiology-possibly shock liver from hypotension from possible sepsis  LFTs/coags improving Dopplers without portal/hepatic vein thrombosis Acute hepatitis  serology negative CMV IgM positive but viral load negative  Negative workup for autoimmune hepatitis completed N-acetylcysteine  12/10 Empirically on lactulose  Gastroenterology input appreciated  Oropharyngeal dysphagia Secondary to critical illness/encephalopathy SLP following-on dysphagia 2 diet.  AKI Hemodynamically mediated due to hypotension Resolved.  Hypokalemia/Hypophosphatemia Repleted  Metabolic acidosis Likely due to delayed clearance of lactic acid-in the setting of liver failure. Improved with supportive care  Self-limited episode-small-volume coffee-ground emesis Hb stable PPI By GI, resolved, no procedures.  Chest pain Likely atypical Resolved  Minimally elevated troponins Trend is flat Likely demand ischemia in the setting of hypotension/critical illness. Echo with stable EF  HTN BP stable, add  Norvasc  and monitor.  History of chronic HFpEF Status stable  History of CVA Baseline left hemiparesis Antiplatelets/statin held-in the setting of elevated liver enzymes and thrombocytopenia.  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    Pressure Ulcer: Agree with assessment and plan as outlined below Wound 04/14/24 0100 Pressure Injury Hip Left Stage 2 -  Partial thickness loss of dermis presenting as a shallow open injury with a red, pink wound bed without slough. (Active)     Wound 04/14/24 0100 Pressure Injury Buttocks Left;Right Unstageable - Full thickness tissue loss in which  the base of the injury is covered by slough (yellow, tan, gray, green or brown) and/or eschar (tan, brown or black) in the wound bed. (Active)     Wound 04/14/24 0100 Pressure Injury Sacrum Medial Stage 2 -  Partial thickness loss of dermis presenting as a shallow open injury with a red, pink wound bed without slough. (Active)    Code status:   Code Status: Limited: Do not attempt resuscitation (DNR) -DNR-LIMITED -Do Not Intubate/DNI    DVT Prophylaxis: SCDs Start: 04/13/24 2344    Family Communication: None  at bedside.  Disposition Plan: Status is: Inpatient     Diet: Diet Order             DIET DYS 3 Room service appropriate? Yes; Fluid consistency: Thin  Diet effective now                     Data Review:   Patient Lines/Drains/Airways Status     Active Line/Drains/Airways     Name Placement date Placement time Site Days   PICC Single Lumen 05/04/24 Right Basilic 36 cm 0 cm 05/04/24  8676  Basilic  36   External Urinary Catheter 04/29/24  0553  --  41   Wound 04/14/24 0100 Pressure Injury Hip Left Stage 2 -  Partial thickness loss of dermis presenting as a shallow open injury with a red, pink wound bed without slough. 04/14/24  0100  Hip  56   Wound 04/14/24 0100 Pressure Injury Buttocks Left;Right Unstageable - Full thickness tissue loss in which the base of the injury is covered by slough (yellow, tan, gray, green or brown) and/or eschar (tan, brown or black) in the wound bed. 04/14/24  0100  Buttocks  56   Wound 04/14/24 0100 Pressure Injury Sacrum Medial Stage 2 -  Partial thickness loss of dermis presenting as a shallow open injury with a red, pink wound bed without slough. 04/14/24  0100  Sacrum  56             Inpatient Medications  Scheduled Meds:  amLODipine   10 mg Oral Daily   Chlorhexidine  Gluconate Cloth  6 each Topical Daily   feeding supplement  237 mL Oral TID BM   Gerhardt's butt cream   Topical BID   lactulose   20 g Oral BID    mirtazapine   30 mg Oral QHS   mouth rinse  15 mL Mouth Rinse 4 times per day   pantoprazole   40 mg Oral BID   QUEtiapine   25 mg Oral BID   sodium chloride  flush  10-40 mL Intracatheter Q12H   Continuous Infusions:   PRN Meds:.bisacodyl , docusate, fentaNYL  (SUBLIMAZE ) injection, HYDROcodone -acetaminophen , ipratropium-albuterol , LORazepam , melatonin, ondansetron  (ZOFRAN ) IV, mouth rinse, polyethylene glycol, QUEtiapine , sodium chloride  flush  DVT Prophylaxis  SCDs Start: 04/13/24 2344   No results for input(s): WBC, HGB, HCT, PLT, MCV, MCH, MCHC, RDW, LYMPHSABS, MONOABS, EOSABS, BASOSABS, BANDABS in the last 168 hours.  Invalid input(s): NEUTRABS, BANDSABD   No results for input(s): NA, K, CL, CO2, ANIONGAP, GLUCOSE, BUN, CREATININE, AST, ALT, ALKPHOS, BILITOT, ALBUMIN , CRP, DDIMER, PROCALCITON, LATICACIDVEN, INR, TSH, CORTISOL, HGBA1C, AMMONIA, BNP, MG, PHOS, CALCIUM  in the last 168 hours.  Invalid input(s): GFRCGP      No results for input(s): CRP, DDIMER, PROCALCITON, LATICACIDVEN, INR, TSH, CORTISOL, HGBA1C, AMMONIA, BNP, MG, CALCIUM  in the last 168 hours.  Invalid input(s): PHOSPHOROUS    --------------------------------------------------------------------------------------------------------------- Lab Results  Component Value Date   CHOL 288 (H) 05/30/2023   HDL 78 05/30/2023   LDLCALC 165 (H) 05/30/2023   TRIG 248 (H) 05/30/2023   CHOLHDL 3.7 05/30/2023    Lab Results  Component Value Date   HGBA1C 5.3 09/11/2020   No results for input(s): TSH, T4TOTAL, FREET4, T3FREE, THYROIDAB in the last 72 hours.   No results for input(s): VITAMINB12, FOLATE, FERRITIN, TIBC, IRON, RETICCTPCT in the last 72 hours. ------------------------------------------------------------------------------------------------------------------ Cardiac  Enzymes No results for input(s): CKMB, TROPONINI, MYOGLOBIN in the last 168 hours.  Invalid input(s): CK  Micro Results No results found for this  or any previous visit (from the past 240 hours).   Radiology Reports  No results found.    Signature  -   Donalda Applebaum M.D on 06/09/2024 at 8:36 AM   -  To page go to [email or web address]  "

## 2024-06-09 NOTE — Plan of Care (Signed)
  Problem: Clinical Measurements: Goal: Ability to maintain clinical measurements within normal limits will improve Outcome: Progressing Goal: Will remain free from infection Outcome: Progressing Goal: Diagnostic test results will improve Outcome: Progressing Goal: Respiratory complications will improve Outcome: Progressing Goal: Cardiovascular complication will be avoided Outcome: Progressing   Problem: Nutrition: Goal: Adequate nutrition will be maintained Outcome: Progressing   Problem: Coping: Goal: Level of anxiety will decrease Outcome: Progressing   Problem: Elimination: Goal: Will not experience complications related to bowel motility Outcome: Progressing Goal: Will not experience complications related to urinary retention Outcome: Progressing   

## 2024-06-10 ENCOUNTER — Inpatient Hospital Stay (HOSPITAL_COMMUNITY)

## 2024-06-10 DIAGNOSIS — K7200 Acute and subacute hepatic failure without coma: Secondary | ICD-10-CM | POA: Diagnosis not present

## 2024-06-10 DIAGNOSIS — R410 Disorientation, unspecified: Secondary | ICD-10-CM | POA: Diagnosis not present

## 2024-06-10 DIAGNOSIS — L8990 Pressure ulcer of unspecified site, unspecified stage: Secondary | ICD-10-CM | POA: Diagnosis not present

## 2024-06-10 DIAGNOSIS — E43 Unspecified severe protein-calorie malnutrition: Secondary | ICD-10-CM | POA: Diagnosis not present

## 2024-06-10 MED ORDER — FLEET ENEMA RE ENEM
1.0000 | ENEMA | Freq: Once | RECTAL | Status: AC
  Administered 2024-06-10: 1 via RECTAL
  Filled 2024-06-10: qty 1

## 2024-06-10 NOTE — Progress Notes (Signed)
 After KUB, pt was able to pass very large/hard stool. Patient states she feels much better and is declining further enemas at this time.

## 2024-06-10 NOTE — TOC Progression Note (Signed)
 Transition of Care [facility]) - Progression Note    Patient Details  Name: [name] MRN: [id number] Date of Birth: 07-23-1945  [facility]) CM/SW Contact  [name], LCSW Phone Number: 06/10/2024, 12:06 PM  Clinical Narrative:    Per [facility], their bed availability is tight. CSW requested [facility] Rehab review referral with APS Camilla's agreement.    Expected Discharge Plan: Skilled Nursing Facility Barriers to Discharge: SNF Pending bed offer, Inadequate or no insurance (APS obtaining guardianship; patient is over assets for LTC Medicaid)               Expected Discharge Plan and Services In-house Referral: Clinical Social Work   Post Acute Care Choice: Skilled Nursing Facility Living arrangements for the past 2 months: Single Family Home                                       Social Drivers of Health (SDOH) Interventions SDOH Screenings   Food Insecurity: Food Insecurity Present (04/15/2024)  Housing: High Risk (04/22/2024)  Transportation Needs: Unmet Transportation Needs (04/15/2024)  Utilities: At Risk (04/15/2024)  Social Connections: Unknown (04/15/2024)  Tobacco Use: Medium Risk (05/10/2024)    Readmission Risk Interventions    01/15/2024    2:24 PM  Readmission Risk Prevention Plan  Transportation Screening Complete  HRI or Home Care Consult Complete  Social Work Consult for Recovery Care Planning/Counseling Complete  Palliative Care Screening Not Applicable  Medication Review Oceanographer) Complete

## 2024-06-10 NOTE — Progress Notes (Signed)
 At bedside for PICC dressing change. Pt refusing. Combative when attempting to position arm. Will revisit at a later time today.

## 2024-06-10 NOTE — Plan of Care (Signed)
  Problem: Pain Managment: Goal: General experience of comfort will improve and/or be controlled Outcome: Progressing

## 2024-06-11 DIAGNOSIS — R410 Disorientation, unspecified: Secondary | ICD-10-CM | POA: Diagnosis not present

## 2024-06-11 DIAGNOSIS — K7200 Acute and subacute hepatic failure without coma: Secondary | ICD-10-CM | POA: Diagnosis not present

## 2024-06-11 DIAGNOSIS — L8990 Pressure ulcer of unspecified site, unspecified stage: Secondary | ICD-10-CM | POA: Diagnosis not present

## 2024-06-11 DIAGNOSIS — E43 Unspecified severe protein-calorie malnutrition: Secondary | ICD-10-CM | POA: Diagnosis not present

## 2024-06-11 LAB — CBC
HCT: 26.2 % — ABNORMAL LOW (ref 36.0–46.0)
Hemoglobin: 8.3 g/dL — ABNORMAL LOW (ref 12.0–15.0)
MCH: 32.7 pg (ref 26.0–34.0)
MCHC: 31.7 g/dL (ref 30.0–36.0)
MCV: 103.1 fL — ABNORMAL HIGH (ref 80.0–100.0)
Platelets: 103 10*3/uL — ABNORMAL LOW (ref 150–400)
RBC: 2.54 MIL/uL — ABNORMAL LOW (ref 3.87–5.11)
RDW: 16.6 % — ABNORMAL HIGH (ref 11.5–15.5)
WBC: 6 10*3/uL (ref 4.0–10.5)
nRBC: 0.3 % — ABNORMAL HIGH (ref 0.0–0.2)

## 2024-06-11 LAB — BASIC METABOLIC PANEL WITH GFR
Anion gap: 8 (ref 5–15)
BUN: 21 mg/dL (ref 8–23)
CO2: 28 mmol/L (ref 22–32)
Calcium: 8.7 mg/dL — ABNORMAL LOW (ref 8.9–10.3)
Chloride: 105 mmol/L (ref 98–111)
Creatinine, Ser: 0.42 mg/dL — ABNORMAL LOW (ref 0.44–1.00)
GFR, Estimated: >60 mL/min
Glucose, Bld: 97 mg/dL (ref 70–99)
Potassium: 3.6 mmol/L (ref 3.5–5.1)
Sodium: 141 mmol/L (ref 135–145)

## 2024-06-11 MED ORDER — SENNOSIDES-DOCUSATE SODIUM 8.6-50 MG PO TABS
2.0000 | ORAL_TABLET | Freq: Every day | ORAL | Status: AC
  Administered 2024-06-11 – 2024-06-14 (×4): 2 via ORAL
  Filled 2024-06-11 (×4): qty 2

## 2024-06-11 MED ORDER — POLYETHYLENE GLYCOL 3350 17 G PO PACK
17.0000 g | PACK | Freq: Every day | ORAL | Status: AC
  Administered 2024-06-12: 17 g via ORAL
  Filled 2024-06-11: qty 1

## 2024-06-11 MED ORDER — POLYETHYLENE GLYCOL 3350 17 G PO PACK
17.0000 g | PACK | Freq: Two times a day (BID) | ORAL | Status: AC
  Administered 2024-06-11 (×2): 17 g via ORAL
  Filled 2024-06-11 (×2): qty 1

## 2024-06-11 NOTE — TOC Progression Note (Signed)
 Transition of Care [facility]) - Progression Note    Patient Details  Name: [name] MRN: [id number] Date of Birth: Oct 27, 1945  [facility]) CM/SW Contact  [name], LCSW Phone Number: 06/11/2024, 12:42 PM  Clinical Narrative:    Spoke with [facility] and [facility] and both noted that patient had been combative over her PICC yesterday. CSW explained that PICC will not be needed at discharge and that patient prefers not to be moved too much. CSW updated [place] with APS who is going to speak with [place] to plead patient's case.    Expected Discharge Plan: Skilled Nursing Facility Barriers to Discharge: SNF Pending bed offer, Inadequate or no insurance (APS obtaining guardianship; patient is over assets for LTC Medicaid)               Expected Discharge Plan and Services In-house Referral: Clinical Social Work   Post Acute Care Choice: Skilled Nursing Facility Living arrangements for the past 2 months: Single Family Home                                       Social Drivers of Health (SDOH) Interventions SDOH Screenings   Food Insecurity: Food Insecurity Present (04/15/2024)  Housing: High Risk (04/22/2024)  Transportation Needs: Unmet Transportation Needs (04/15/2024)  Utilities: At Risk (04/15/2024)  Social Connections: Unknown (04/15/2024)  Tobacco Use: Medium Risk (05/10/2024)    Readmission Risk Interventions    01/15/2024    2:24 PM  Readmission Risk Prevention Plan  Transportation Screening Complete  HRI or Home Care Consult Complete  Social Work Consult for Recovery Care Planning/Counseling Complete  Palliative Care Screening Not Applicable  Medication Review Oceanographer) Complete

## 2024-06-11 NOTE — Progress Notes (Signed)
 Spoke with nurse regarding order to discontinue PICC. Noted prn IV medication orders. Noted social worker progress note. Nurse indicates access no longer needed. Discontinued per order.

## 2024-06-11 NOTE — Plan of Care (Signed)

## 2024-06-12 ENCOUNTER — Inpatient Hospital Stay (HOSPITAL_COMMUNITY)

## 2024-06-12 MED ORDER — ACETAMINOPHEN 500 MG PO TABS
500.0000 mg | ORAL_TABLET | Freq: Three times a day (TID) | ORAL | Status: AC
  Administered 2024-06-12 – 2024-06-14 (×8): 500 mg via ORAL
  Filled 2024-06-12 (×8): qty 1

## 2024-06-12 MED ORDER — LIDOCAINE 5 % EX PTCH
1.0000 | MEDICATED_PATCH | CUTANEOUS | Status: AC
  Administered 2024-06-12 – 2024-06-14 (×3): 1 via TRANSDERMAL
  Filled 2024-06-12 (×3): qty 1

## 2024-06-12 MED ORDER — HYDROCODONE-ACETAMINOPHEN 5-325 MG PO TABS
1.0000 | ORAL_TABLET | Freq: Once | ORAL | Status: AC
  Administered 2024-06-12: 1 via ORAL
  Filled 2024-06-12: qty 1

## 2024-06-12 NOTE — Progress Notes (Signed)
 [facility] Progress Note Patient: [name] FMW:[id number] DOB: 1946/05/07  DOA: 04/13/2024 DOS: the patient was seen and examined on 06/12/2024  Brief Summary: Patient is a 79 y.o.  female with history of CVA, COPD, HTN-who was brought to the ED after she was found by her roommate unresponsive (no heat/light-under a single blanket)-she was apparently covered in feces/dried blood-found to be hypothermic/hypotensive/hypoglycemic-she was thought to have undifferentiated shock-acute liver failure-acute kidney injury-admitted to the ICU-stabilized with pressors/D10 infusion/broad-spectrum antibiotics-rapidly improved and subsequently transferred to [facility].  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  Unfortunately-in spite of being on IV antibiotics-other supportive measures-an extended hospitalization-patient did not have any significant clinical improvement-oral intake continues to be erratic and mostly poor-palliative care was involved-after extensive discussion with patient's son-was transition to comfort measures on 12/29.   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  Due to thrombocytopenia-TEE was not pursued-however it was felt that since patient would be treated for 6 weeks-TEE would not change management.  PICC line was placed on 12/27.  Given ANA positive status-nonbacterial thrombotic endocarditis is also possible.   Unfortunately-no significant clinical improvement-has been followed by palliative care services closely-after discussion with patient's son-all antibiotics was discontinued 12/29-with focus mostly on comfort.   After patient was transitioned to full comfort care she has remained clinically stable and in fact started showing some signs of improvement , overall she remains with poor appetite, not interactive with staff,  afebrile, no signs of active infection.  She remains DNR with plan being to continue gentle medical treatment and look for appropriate placement.   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   Treated with 2 doses of IVIG on 12/12 and 12/13 without any significant improvement-subsequently given Nplate  on 12/14 and 12/19-thankfully platelet counts now improved-but remains low.  Repeat CBC periodically.   Normocytic anemia   Likely secondary to critical illness-no evidence of  blood loss/GI bleeding Required 2 units of PRBC so far Repeat CBC periodically.   SVT on 12/15 now resting bradycardia with stable blood pressures Required adenosine  Currently stable off of medications, now is resting sinus bradycardia with stable blood pressures, heart rate in mid 50s, TSH free T4 stable.  EKG on 05/28/2024 stable with sinus bradycardia, rate 57  bpm.   Acute metabolic encephalopathy-likely superimposed on underlying dementia. Hospital delirium Acute metabolic encephalopathy initially felt to be secondary to possible sepsis physiology/liver failure-however continued to have waxing/waning mentation-even after treatment of underlying sepsis physiology/improvement in liver function- at this point is secondary to hospital-acquired delirium-probably superimposed on some amount of undiagnosed dementia.  Evaluated by psychiatry-does not have capacity Started on Remeron  and Seroquel  Has now been transition to full comfort measures.   Hypokalemia Replaced   Hypothermia Likely secondary to environmental exposure (per prior notes- cool room with single blanket-no heating or light) TSH stable Now normothermic after supportive care   Acute liver failure Unclear etiology-possibly shock liver from hypotension from possible sepsis  LFTs/coags improving Dopplers without portal/hepatic vein thrombosis Acute hepatitis serology negative CMV IgM positive but viral load negative  Negative workup for autoimmune hepatitis completed N-acetylcysteine  12/10 Empirically on lactulose  Gastroenterology input appreciated   Oropharyngeal dysphagia Secondary to critical illness/encephalopathy SLP following-on dysphagia 2 diet.   AKI Hemodynamically mediated due to hypotension Resolved.   Hypokalemia/Hypophosphatemia Repleted   Metabolic acidosis Likely due to delayed clearance of lactic acid-in the setting of liver failure. Improved with supportive care   Self-limited  episode-small-volume coffee-ground emesis Hb stable PPI Per GI, resolved, no procedures.   Chest pain Likely atypical Resolved   Minimally elevated troponins Trend is flat Likely demand ischemia in the setting of hypotension/critical illness. Echo with stable EF   HTN BP stable-continue amlodipine .   History of chronic HFpEF Status stable   History of CVA Baseline left hemiparesis Antiplatelets/statin held-in the setting of elevated liver enzymes and thrombocytopenia.   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    Pressure ulcers, present on admission.  Stage II sacral, bilateral buttocks, stage II left hip. Continue dressing changes.  Left hip pain. It appears the patient has been complaining about left hip pain multiple times even in the past. CT chest Abdo pelvis on 04/13/2024 was not showing any acute abnormality. Will perform a dedicated x-ray hip again although x-ray hip in November 2025 was negative for any acute abnormality as well.   DVT Prophylaxis: SCDs Start: 04/13/24 2344   Data review I have Reviewed nursing notes, Vitals, and Lab results. Since last encounter, pertinent lab results CBC and BMP   . I have ordered test including hip x-ray.  SABRA   Physical exam. Vitals:   06/11/24 1600 06/11/24 2008 06/12/24 0800 06/12/24 1200  BP: (!) 113/57 (!) 102/56 (!) 128/49 114/67  Pulse:  77 (!) 56   Resp: 16  17 18   Temp:  98.1 F (36.7 C) 97.7 F (36.5 C) 98.6 F (37 C) 98.4 F (36.9 C)  TempSrc: Oral Axillary Axillary Axillary  SpO2: 95%     Weight:      Height:      Clear to auscultation. S1-S2 present. No point tenderness on the left hip area. No redness warmth or swelling of the left leg noted as well.  Subjective: Complains of left hip pain.  No nausea no vomiting.  Family Communication: No one at  bedside.  Disposition Plan: Status is: Inpatient Remains inpatient appropriate because: Monitor for improvement in pain control medically stable.   Planned Discharge Destination: SNF Diet: Diet Order             DIET DYS 3 Room service appropriate? Yes; Fluid consistency: Thin  Diet effective now                   MEDICATIONS: Scheduled Meds:  acetaminophen   500 mg Oral Q8H   amLODipine   10 mg Oral Daily   Chlorhexidine  Gluconate Cloth  6 each Topical Daily   feeding supplement  237 mL Oral TID BM   Gerhardt's butt cream   Topical BID   lactulose   20 g Oral BID   lidocaine   1 patch Transdermal Q24H   mirtazapine   30 mg Oral QHS   mouth rinse  15 mL Mouth Rinse 4 times per day   pantoprazole   40 mg Oral BID   polyethylene glycol  17 g Oral Daily   QUEtiapine   25 mg Oral BID   senna-docusate  2 tablet Oral QHS   sodium chloride  flush  10-40 mL Intracatheter Q12H   Continuous Infusions: PRN Meds:.bisacodyl , fentaNYL  (SUBLIMAZE ) injection, HYDROcodone -acetaminophen , ipratropium-albuterol , LORazepam , melatonin, ondansetron  (ZOFRAN ) IV, mouth rinse, QUEtiapine , sodium chloride  flush  Author: Yetta Blanch, MD  [facility] 06/12/2024  5:03 PM Between 7PM-7AM, please contact night-coverage, check [email or web address] for on call.

## 2024-06-12 NOTE — Plan of Care (Signed)

## 2024-06-13 NOTE — Progress Notes (Signed)
 [facility] Progress Note Patient: [name] FMW:[id number] DOB: Mar 25, 1946  DOA: 04/13/2024 DOS: the patient was seen and examined on 06/13/2024  Brief Summary: Patient is a 79 y.o.  female with history of CVA, COPD, HTN-who was brought to the ED after she was found by her roommate unresponsive (no heat/light-under a single blanket)-she was apparently covered in feces/dried blood-found to be hypothermic/hypotensive/hypoglycemic-she was thought to have undifferentiated shock-acute liver failure-acute kidney injury-admitted to the ICU-stabilized with pressors/D10 infusion/broad-spectrum antibiotics-rapidly improved and subsequently transferred to [facility].  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  Unfortunately-in spite of being on IV antibiotics-other supportive measures-an extended hospitalization-patient did not have any significant clinical improvement-oral intake continues to be erratic and mostly poor-palliative care was involved-after extensive discussion with patient's son-was transition to comfort measures on 12/29.   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  Due to thrombocytopenia-TEE was not pursued-however it was felt that since patient would be treated for 6 weeks-TEE would not change management.  PICC line was placed on 12/27.  Given ANA positive status-nonbacterial thrombotic endocarditis is also possible. Unfortunately-no significant clinical improvement-has been followed by palliative care services closely-after discussion with patient's son-all antibiotics was discontinued 12/29-with focus mostly on comfort.   After patient was transitioned to full comfort care she has remained clinically stable and in fact started showing some signs of improvement , overall she remains with poor appetite, not interactive with staff,  afebrile, no signs of active infection.   She remains DNR with plan being to continue gentle medical treatment and look for appropriate placement.   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   Treated with 2 doses of IVIG on 12/12 and 12/13 without any significant improvement-subsequently given Nplate  on 12/14 and 12/19-thankfully platelet counts now improved-but remains low.  Repeat CBC periodically.   Normocytic anemia   Likely secondary to critical illness-no evidence of  blood loss/GI bleeding Required 2 units of PRBC so far Repeat CBC periodically.   SVT on 12/15 now resting bradycardia with stable blood pressures Required adenosine  Currently stable off of medications, now is resting sinus bradycardia with stable blood pressures, heart rate in mid 50s, TSH free T4 stable.  EKG on 05/28/2024 stable with sinus bradycardia, rate 57 bpm.  Acute metabolic encephalopathy-likely superimposed on underlying dementia. Hospital delirium Acute metabolic encephalopathy initially felt to be secondary to possible sepsis physiology/liver failure-however continued to have waxing/waning mentation-even after treatment of underlying sepsis physiology/improvement in liver function- at this point is secondary to hospital-acquired delirium-probably superimposed on some amount of undiagnosed dementia.  Evaluated by psychiatry-does not have capacity Started on Remeron  and Seroquel  Has now been transition to full comfort measures.   Hypokalemia Replaced   Hypothermia Likely secondary to environmental exposure (per prior notes- cool room with single blanket-no heating or light) TSH stable Now normothermic after supportive care   Acute liver failure Unclear etiology-possibly shock liver from hypotension from possible sepsis  LFTs/coags improving Dopplers without portal/hepatic vein thrombosis Acute hepatitis serology negative CMV IgM positive but viral load negative  Negative workup for autoimmune hepatitis completed N-acetylcysteine  12/10 Empirically on lactulose  Gastroenterology input appreciated   Oropharyngeal dysphagia Secondary to critical illness/encephalopathy SLP following-on dysphagia 2 diet.   AKI Hemodynamically mediated due to hypotension Resolved.   Hypokalemia/Hypophosphatemia Repleted   Metabolic acidosis Likely due to delayed clearance of lactic acid-in the setting of liver failure. Improved with supportive care   Self-limited  episode-small-volume coffee-ground emesis Hb stable PPI Per GI, resolved, no procedures.   Chest pain Likely atypical Resolved   Minimally elevated troponins Trend is flat Likely demand ischemia in the setting of hypotension/critical illness. Echo with stable EF   HTN BP stable-continue amlodipine .   History of chronic HFpEF Status stable   History of CVA Baseline left hemiparesis Antiplatelets/statin held-in the setting of elevated liver enzymes and thrombocytopenia.   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    Pressure ulcers, present on admission.  Stage II sacral, bilateral buttocks, stage II left hip. Continue dressing changes.  Left hip pain. It appears the patient has been complaining about left hip pain multiple times even in the past. CT chest Abdo pelvis on 04/13/2024 was not showing any acute abnormality. Will perform a dedicated x-ray hip again although x-ray hip in November 2025 was negative for any acute abnormality as well.   DVT Prophylaxis: SCDs Start: 04/13/24 2344   Data review I have Reviewed nursing notes, Vitals, and Lab results. Since last encounter, pertinent lab results CBC and BMP   . I have ordered test including hip x-ray.  SABRA   Physical exam. Vitals:   06/12/24 0800 06/12/24 1200 06/12/24 1947 06/13/24 0850  BP: (!) 128/49 114/67 (!) 119/54 (!) 130/57  Pulse: (!) 56  67 (!) 51  Resp: 17 18  17    Temp: 98.6 F (37 C) 98.4 F (36.9 C) 99.5 F (37.5 C) 98.7 F (37.1 C)  TempSrc: Axillary Axillary Oral Axillary  SpO2:      Weight:      Height:      Clear to auscultation. S1-S2 present. No point tenderness on the left hip area. No redness warmth or swelling of the left leg noted as well.  Subjective: Complains of left hip pain.  No nausea no vomiting.  Family Communication: No one at bedside.  Disposition Plan: Status is: Inpatient Remains inpatient appropriate because: Monitor for improvement in pain control medically stable.   Planned Discharge Destination: SNF Diet: Diet Order             DIET DYS 3 Room service appropriate? Yes; Fluid consistency: Thin  Diet effective now                   MEDICATIONS: Scheduled Meds:  acetaminophen   500 mg Oral Q8H   amLODipine   10 mg Oral Daily   Chlorhexidine  Gluconate Cloth  6 each Topical Daily   feeding supplement  237 mL Oral TID BM   Gerhardt's butt cream   Topical BID   lactulose   20 g Oral BID   lidocaine   1 patch Transdermal Q24H   mirtazapine   30 mg Oral QHS   mouth rinse  15 mL Mouth Rinse 4 times per day   pantoprazole   40 mg Oral BID   polyethylene glycol  17 g Oral Daily   QUEtiapine   25 mg Oral BID   senna-docusate  2 tablet Oral QHS   sodium chloride  flush  10-40 mL Intracatheter Q12H   Continuous Infusions: PRN Meds:.bisacodyl , fentaNYL  (SUBLIMAZE ) injection, HYDROcodone -acetaminophen , ipratropium-albuterol , LORazepam , melatonin, ondansetron  (ZOFRAN ) IV, mouth rinse, QUEtiapine , sodium chloride  flush  Author: Yetta Blanch, MD  [facility] 06/13/2024  7:18 PM Between 7PM-7AM, please contact night-coverage, check [email or web address] for on call.

## 2024-06-13 NOTE — TOC Progression Note (Signed)
 Transition of Care [facility]) - Progression Note    Patient Details  Name: [name] MRN: [id number] Date of Birth: 1946-01-25  [facility]) CM/SW Contact  [name], LCSW Phone Number: 06/13/2024, 9:37 AM  Clinical Narrative:    CSW continuing to follow with [facility] to find a bed and sort out finances.    Expected Discharge Plan: Skilled Nursing Facility Barriers to Discharge: SNF Pending bed offer, Inadequate or no insurance ([facility] obtaining guardianship; patient is over assets for LTC Medicaid)               Expected Discharge Plan and Services In-house Referral: Clinical Social Work   Post Acute Care Choice: Skilled Nursing Facility Living arrangements for the past 2 months: Single Family Home                                       Social Drivers of Health (SDOH) Interventions SDOH Screenings   Food Insecurity: Food Insecurity Present (04/15/2024)  Housing: High Risk (04/22/2024)  Transportation Needs: Unmet Transportation Needs (04/15/2024)  Utilities: At Risk (04/15/2024)  Social Connections: Unknown (04/15/2024)  Tobacco Use: Medium Risk (05/10/2024)    Readmission Risk Interventions    01/15/2024    2:24 PM  Readmission Risk Prevention Plan  Transportation Screening Complete  HRI or Home Care Consult Complete  Social Work Consult for Recovery Care Planning/Counseling Complete  Palliative Care Screening Not Applicable  Medication Review [employer]) Complete

## 2024-06-14 LAB — BASIC METABOLIC PANEL WITH GFR
Anion gap: 12 (ref 5–15)
BUN: 22 mg/dL (ref 8–23)
CO2: 22 mmol/L (ref 22–32)
Calcium: 8.6 mg/dL — ABNORMAL LOW (ref 8.9–10.3)
Chloride: 106 mmol/L (ref 98–111)
Creatinine, Ser: 0.42 mg/dL — ABNORMAL LOW (ref 0.44–1.00)
GFR, Estimated: >60 mL/min
Glucose, Bld: 87 mg/dL (ref 70–99)
Potassium: 3.6 mmol/L (ref 3.5–5.1)
Sodium: 140 mmol/L (ref 135–145)

## 2024-06-14 LAB — CBC WITH DIFFERENTIAL/PLATELET
Abs Immature Granulocytes: 0.01 10*3/uL (ref 0.00–0.07)
Basophils Absolute: 0 10*3/uL (ref 0.0–0.1)
Basophils Relative: 1 %
Eosinophils Absolute: 0.2 10*3/uL (ref 0.0–0.5)
Eosinophils Relative: 5 %
HCT: 26.4 % — ABNORMAL LOW (ref 36.0–46.0)
Hemoglobin: 8.2 g/dL — ABNORMAL LOW (ref 12.0–15.0)
Immature Granulocytes: 0 %
Lymphocytes Relative: 32 %
Lymphs Abs: 1.1 10*3/uL (ref 0.7–4.0)
MCH: 32.2 pg (ref 26.0–34.0)
MCHC: 31.1 g/dL (ref 30.0–36.0)
MCV: 103.5 fL — ABNORMAL HIGH (ref 80.0–100.0)
Monocytes Absolute: 0.4 10*3/uL (ref 0.1–1.0)
Monocytes Relative: 12 %
Neutro Abs: 1.7 10*3/uL (ref 1.7–7.7)
Neutrophils Relative %: 50 %
Platelets: 121 10*3/uL — ABNORMAL LOW (ref 150–400)
RBC: 2.55 MIL/uL — ABNORMAL LOW (ref 3.87–5.11)
RDW: 16.6 % — ABNORMAL HIGH (ref 11.5–15.5)
WBC: 3.4 10*3/uL — ABNORMAL LOW (ref 4.0–10.5)
nRBC: 0 % (ref 0.0–0.2)

## 2024-06-14 LAB — T4, FREE: Free T4: 1.01 ng/dL (ref 0.80–2.00)

## 2024-06-14 LAB — IRON AND TIBC
Iron: 50 ug/dL (ref 28–170)
Saturation Ratios: 27 % (ref 10.4–31.8)
TIBC: 185 ug/dL — ABNORMAL LOW (ref 250–450)
UIBC: 135 ug/dL

## 2024-06-14 LAB — TSH: TSH: 1.32 u[IU]/mL (ref 0.350–4.500)

## 2024-06-14 LAB — VITAMIN B12: Vitamin B-12: 588 pg/mL (ref 180–914)

## 2024-06-14 LAB — FOLATE: Folate: 8.5 ng/mL

## 2024-06-14 MED ORDER — B COMPLEX-C PO TABS
1.0000 | ORAL_TABLET | Freq: Every day | ORAL | Status: AC
  Administered 2024-06-14: 1 via ORAL
  Filled 2024-06-14: qty 1

## 2024-06-14 NOTE — Plan of Care (Signed)

## 2024-06-14 NOTE — Progress Notes (Signed)
 [facility] PROGRESS NOTE Patient: [name] FMW:[id number]   DOA: 04/13/2024   DOS: 06/14/2024    Subjective: No acute complained. No events overnight  Objective:  Vitals:   06/13/24 0850 06/13/24 2151 06/13/24 2154 06/14/24 0832  BP: (!) 130/57 (!) 126/54 (!) 126/54 104/77  Pulse: (!) 51 68  65  Resp: 17 17    Temp: 98.7 F (37.1 C) 97.9 F (36.6 C)  98.2 F (36.8 C)  TempSrc: Axillary Axillary  Axillary  SpO2:      Weight:      Height:      Clear to auscultation.   Data: CBC BMP and Anemia panel were checked.   Assessment and plan: Anemia with relative folic acid deficiency. Will replace.   Author: Yetta Blanch, MD [facility] 06/14/2024 5:53 PM  Between 7PM-7AM, please contact night-coverage listed at [email or web address]
